# Patient Record
Sex: Female | Born: 1955
Health system: Southern US, Community
[De-identification: ages and names within clinical notes are randomized; demographics above are authoritative.]

## PROBLEM LIST (undated history)

## (undated) DIAGNOSIS — B338 Other specified viral diseases: Secondary | ICD-10-CM

## (undated) DIAGNOSIS — I209 Angina pectoris, unspecified: Secondary | ICD-10-CM

## (undated) DIAGNOSIS — K909 Intestinal malabsorption, unspecified: Secondary | ICD-10-CM

## (undated) DIAGNOSIS — R7303 Prediabetes: Secondary | ICD-10-CM

## (undated) DIAGNOSIS — R739 Hyperglycemia, unspecified: Secondary | ICD-10-CM

## (undated) DIAGNOSIS — Z8489 Family history of other specified conditions: Secondary | ICD-10-CM

## (undated) DIAGNOSIS — R011 Cardiac murmur, unspecified: Secondary | ICD-10-CM

## (undated) DIAGNOSIS — R112 Nausea with vomiting, unspecified: Secondary | ICD-10-CM

## (undated) DIAGNOSIS — K5792 Diverticulitis of intestine, part unspecified, without perforation or abscess without bleeding: Secondary | ICD-10-CM

## (undated) DIAGNOSIS — M1990 Unspecified osteoarthritis, unspecified site: Secondary | ICD-10-CM

## (undated) DIAGNOSIS — I499 Cardiac arrhythmia, unspecified: Secondary | ICD-10-CM

## (undated) DIAGNOSIS — I498 Other specified cardiac arrhythmias: Secondary | ICD-10-CM

## (undated) DIAGNOSIS — K4090 Unilateral inguinal hernia, without obstruction or gangrene, not specified as recurrent: Secondary | ICD-10-CM

## (undated) DIAGNOSIS — K829 Disease of gallbladder, unspecified: Secondary | ICD-10-CM

## (undated) DIAGNOSIS — I480 Paroxysmal atrial fibrillation: Secondary | ICD-10-CM

## (undated) DIAGNOSIS — Z87442 Personal history of urinary calculi: Secondary | ICD-10-CM

## (undated) DIAGNOSIS — I4892 Unspecified atrial flutter: Secondary | ICD-10-CM

## (undated) DIAGNOSIS — K635 Polyp of colon: Secondary | ICD-10-CM

## (undated) DIAGNOSIS — E7800 Pure hypercholesterolemia, unspecified: Secondary | ICD-10-CM

## (undated) DIAGNOSIS — I82409 Acute embolism and thrombosis of unspecified deep veins of unspecified lower extremity: Secondary | ICD-10-CM

## (undated) DIAGNOSIS — Z9889 Other specified postprocedural states: Secondary | ICD-10-CM

## (undated) DIAGNOSIS — D6851 Activated protein C resistance: Secondary | ICD-10-CM

## (undated) DIAGNOSIS — G5603 Carpal tunnel syndrome, bilateral upper limbs: Secondary | ICD-10-CM

## (undated) DIAGNOSIS — R42 Dizziness and giddiness: Secondary | ICD-10-CM

## (undated) DIAGNOSIS — K449 Diaphragmatic hernia without obstruction or gangrene: Secondary | ICD-10-CM

## (undated) DIAGNOSIS — B974 Respiratory syncytial virus as the cause of diseases classified elsewhere: Secondary | ICD-10-CM

## (undated) DIAGNOSIS — Z8719 Personal history of other diseases of the digestive system: Secondary | ICD-10-CM

## (undated) DIAGNOSIS — K219 Gastro-esophageal reflux disease without esophagitis: Secondary | ICD-10-CM

## (undated) DIAGNOSIS — M5432 Sciatica, left side: Secondary | ICD-10-CM

## (undated) DIAGNOSIS — K922 Gastrointestinal hemorrhage, unspecified: Secondary | ICD-10-CM

## (undated) DIAGNOSIS — D500 Iron deficiency anemia secondary to blood loss (chronic): Secondary | ICD-10-CM

## (undated) DIAGNOSIS — I10 Essential (primary) hypertension: Secondary | ICD-10-CM

## (undated) DIAGNOSIS — IMO0001 Reserved for inherently not codable concepts without codable children: Secondary | ICD-10-CM

## (undated) DIAGNOSIS — E669 Obesity, unspecified: Secondary | ICD-10-CM

## (undated) DIAGNOSIS — G4733 Obstructive sleep apnea (adult) (pediatric): Secondary | ICD-10-CM

## (undated) DIAGNOSIS — E119 Type 2 diabetes mellitus without complications: Secondary | ICD-10-CM

## (undated) DIAGNOSIS — T410X5A Adverse effect of inhaled anesthetics, initial encounter: Secondary | ICD-10-CM

## (undated) DIAGNOSIS — R7611 Nonspecific reaction to tuberculin skin test without active tuberculosis: Secondary | ICD-10-CM

## (undated) HISTORY — DX: Iron deficiency anemia secondary to blood loss (chronic): D50.0

## (undated) HISTORY — DX: Intestinal malabsorption, unspecified: K90.9

## (undated) HISTORY — DX: Gastro-esophageal reflux disease without esophagitis: K21.9

## (undated) HISTORY — PX: CARPAL TUNNEL RELEASE: SHX101

## (undated) HISTORY — DX: Diaphragmatic hernia without obstruction or gangrene: K44.9

## (undated) HISTORY — DX: Essential (primary) hypertension: I10

## (undated) HISTORY — DX: Obesity, unspecified: E66.9

## (undated) HISTORY — DX: Sciatica, left side: M54.32

## (undated) HISTORY — DX: Hyperglycemia, unspecified: R73.9

## (undated) HISTORY — PX: ESOPHAGOGASTRODUODENOSCOPY: SHX1529

## (undated) HISTORY — PX: COLON RESECTION: SHX5231

## (undated) HISTORY — PX: TMJ ARTHROPLASTY: SHX1066

## (undated) HISTORY — DX: Dizziness and giddiness: R42

## (undated) HISTORY — DX: Disease of gallbladder, unspecified: K82.9

## (undated) HISTORY — PX: OTHER SURGICAL HISTORY: SHX169

## (undated) HISTORY — DX: Gastrointestinal hemorrhage, unspecified: K92.2

## (undated) HISTORY — DX: Paroxysmal atrial fibrillation: I48.0

## (undated) HISTORY — DX: Unilateral inguinal hernia, without obstruction or gangrene, not specified as recurrent: K40.90

## (undated) HISTORY — DX: Carpal tunnel syndrome, bilateral upper limbs: G56.03

## (undated) HISTORY — PX: COLONOSCOPY: SHX174

## (undated) HISTORY — DX: Pure hypercholesterolemia, unspecified: E78.00

## (undated) HISTORY — PX: COLONOSCOPY W/ BIOPSIES AND POLYPECTOMY: SHX1376

## (undated) HISTORY — PX: INGUINAL HERNIA REPAIR: SUR1180

## (undated) HISTORY — DX: Reserved for inherently not codable concepts without codable children: IMO0001

## (undated) HISTORY — PX: BREAST CYST ASPIRATION: SHX578

## (undated) HISTORY — DX: Polyp of colon: K63.5

---

## 1975-03-24 DIAGNOSIS — R7611 Nonspecific reaction to tuberculin skin test without active tuberculosis: Secondary | ICD-10-CM

## 1975-03-24 HISTORY — DX: Nonspecific reaction to tuberculin skin test without active tuberculosis: R76.11

## 1986-03-23 HISTORY — PX: TUBAL LIGATION: SHX77

## 1987-03-24 HISTORY — PX: LAPAROSCOPIC CHOLECYSTECTOMY: SUR755

## 2004-02-22 ENCOUNTER — Encounter: Payer: Self-pay | Admitting: Internal Medicine

## 2006-03-23 HISTORY — PX: CARDIAC CATHETERIZATION: SHX172

## 2006-07-26 ENCOUNTER — Encounter: Payer: Self-pay | Admitting: Internal Medicine

## 2006-11-10 ENCOUNTER — Encounter: Payer: Self-pay | Admitting: Internal Medicine

## 2007-08-29 ENCOUNTER — Encounter: Payer: Self-pay | Admitting: Internal Medicine

## 2008-01-11 ENCOUNTER — Encounter: Payer: Self-pay | Admitting: Internal Medicine

## 2008-01-26 ENCOUNTER — Encounter: Payer: Self-pay | Admitting: Internal Medicine

## 2008-02-03 ENCOUNTER — Encounter: Payer: Self-pay | Admitting: Internal Medicine

## 2008-03-23 HISTORY — PX: ATRIAL FIBRILLATION ABLATION: SHX5732

## 2008-04-11 ENCOUNTER — Encounter: Payer: Self-pay | Admitting: Internal Medicine

## 2008-04-12 ENCOUNTER — Encounter: Payer: Self-pay | Admitting: Internal Medicine

## 2008-07-30 ENCOUNTER — Encounter: Payer: Self-pay | Admitting: Internal Medicine

## 2008-08-21 ENCOUNTER — Encounter: Payer: Self-pay | Admitting: Internal Medicine

## 2008-11-19 ENCOUNTER — Encounter: Payer: Self-pay | Admitting: Internal Medicine

## 2009-05-30 ENCOUNTER — Encounter: Payer: Self-pay | Admitting: Internal Medicine

## 2009-06-07 ENCOUNTER — Encounter: Payer: Self-pay | Admitting: Internal Medicine

## 2009-09-09 DIAGNOSIS — I480 Paroxysmal atrial fibrillation: Secondary | ICD-10-CM | POA: Insufficient documentation

## 2009-09-09 HISTORY — DX: Paroxysmal atrial fibrillation: I48.0

## 2009-09-10 DIAGNOSIS — G5600 Carpal tunnel syndrome, unspecified upper limb: Secondary | ICD-10-CM | POA: Insufficient documentation

## 2009-09-10 DIAGNOSIS — E7800 Pure hypercholesterolemia, unspecified: Secondary | ICD-10-CM | POA: Insufficient documentation

## 2009-09-10 DIAGNOSIS — K219 Gastro-esophageal reflux disease without esophagitis: Secondary | ICD-10-CM | POA: Insufficient documentation

## 2009-09-10 DIAGNOSIS — K829 Disease of gallbladder, unspecified: Secondary | ICD-10-CM | POA: Insufficient documentation

## 2009-09-10 DIAGNOSIS — J45909 Unspecified asthma, uncomplicated: Secondary | ICD-10-CM | POA: Insufficient documentation

## 2009-09-10 DIAGNOSIS — M26609 Unspecified temporomandibular joint disorder, unspecified side: Secondary | ICD-10-CM | POA: Insufficient documentation

## 2009-09-11 ENCOUNTER — Telehealth (INDEPENDENT_AMBULATORY_CARE_PROVIDER_SITE_OTHER): Payer: Self-pay | Admitting: *Deleted

## 2009-09-11 ENCOUNTER — Ambulatory Visit: Payer: Self-pay | Admitting: Internal Medicine

## 2009-09-11 ENCOUNTER — Ambulatory Visit: Payer: Self-pay | Admitting: Cardiovascular Disease

## 2009-10-09 ENCOUNTER — Ambulatory Visit: Payer: Self-pay | Admitting: Cardiology

## 2009-10-09 LAB — CONVERTED CEMR LAB: POC INR: 2.1

## 2009-11-09 ENCOUNTER — Ambulatory Visit (HOSPITAL_COMMUNITY): Admission: RE | Admit: 2009-11-09 | Discharge: 2009-11-09 | Payer: Self-pay | Admitting: Sports Medicine

## 2009-11-13 ENCOUNTER — Ambulatory Visit: Payer: Self-pay | Admitting: Internal Medicine

## 2009-12-06 ENCOUNTER — Ambulatory Visit: Payer: Self-pay | Admitting: Internal Medicine

## 2009-12-11 ENCOUNTER — Ambulatory Visit: Payer: Self-pay | Admitting: Cardiology

## 2009-12-11 LAB — CONVERTED CEMR LAB: POC INR: 2.3

## 2010-01-07 ENCOUNTER — Ambulatory Visit: Payer: Self-pay | Admitting: Cardiology

## 2010-01-20 ENCOUNTER — Ambulatory Visit: Payer: Self-pay | Admitting: Internal Medicine

## 2010-01-20 LAB — CONVERTED CEMR LAB: POC INR: 2.4

## 2010-02-17 ENCOUNTER — Ambulatory Visit: Payer: Self-pay | Admitting: Internal Medicine

## 2010-02-17 LAB — CONVERTED CEMR LAB: POC INR: 2.8

## 2010-02-28 ENCOUNTER — Encounter: Payer: Self-pay | Admitting: Internal Medicine

## 2010-02-28 ENCOUNTER — Ambulatory Visit: Payer: Self-pay | Admitting: Internal Medicine

## 2010-03-19 ENCOUNTER — Ambulatory Visit: Payer: Self-pay | Admitting: Cardiology

## 2010-03-19 LAB — CONVERTED CEMR LAB: POC INR: 2

## 2010-04-10 ENCOUNTER — Ambulatory Visit: Payer: Self-pay | Admitting: Internal Medicine

## 2010-04-16 ENCOUNTER — Encounter: Payer: Self-pay | Admitting: Internal Medicine

## 2010-04-16 ENCOUNTER — Ambulatory Visit: Admission: RE | Admit: 2010-04-16 | Discharge: 2010-04-16 | Payer: Self-pay | Source: Home / Self Care

## 2010-04-16 LAB — CONVERTED CEMR LAB
INR: 2.2
POC INR: 2.2

## 2010-04-16 LAB — CBC WITH DIFFERENTIAL/PLATELET
BASO%: 0.7 % (ref 0.0–2.0)
Basophils Absolute: 0 10*3/uL (ref 0.0–0.1)
Eosinophils Absolute: 0.1 10*3/uL (ref 0.0–0.5)
HCT: 38.2 % (ref 34.8–46.6)
MCHC: 34.2 g/dL (ref 31.5–36.0)
MONO#: 0.4 10*3/uL (ref 0.1–0.9)
MONO%: 5.8 % (ref 0.0–14.0)
NEUT%: 56.7 % (ref 38.4–76.8)
Platelets: 338 10*3/uL (ref 145–400)
RDW: 13.4 % (ref 11.2–14.5)
WBC: 6.8 10*3/uL (ref 3.9–10.3)

## 2010-04-21 LAB — HYPERCOAGULABLE PANEL, COMPREHENSIVE
AntiThromb III Func: 131 % — ABNORMAL HIGH (ref 76–126)
Anticardiolipin IgA: 2 APL U/mL (ref <22)
Anticardiolipin IgG: 2 GPL U/mL (ref <23)
Beta-2-Glycoprotein I IgA: 2 A Units (ref <20)
DRVVT 1:1 Mix: 40.9 secs (ref 36.2–44.3)
Lupus Anticoagulant: NOT DETECTED
Protein C, Total: 98 % (ref 70–140)
Protein S Activity: 52 % — ABNORMAL LOW (ref 69–129)
Protein S Ag, Total: 74 % (ref 70–140)

## 2010-04-22 NOTE — Procedures (Signed)
Summary: [facility] Holter Report   Holter Report   Imported By: Roderic Ovens 10/31/2009 12:14:05  _____________________________________________________________________  External Attachment:    Type:   Image     Comment:   External Document

## 2010-04-22 NOTE — Progress Notes (Signed)
  pt came in today signed 5 ROI, faxed to [facility],Dr.ernest Amiga,[facility]. Erle Crocker  September 11, 2009 12:41 PM     Appended Document:  Recieved Records from [facility]..gave to Marlowe Kays   Appended Document:  Received records from [facility] (2 separate packs) via the fax machine-forwarded to Dr. Johney Frame / Tresa Endo for review.  Appended Document:  Recieved another set tof Records from [facility] gave to [place]   Appended Document:  Recieved Records back form [facility] Dr.Ernest Gomez Clever[place]. put on Gesila's desk   Appended Document:  Records Recieved back from [facility] sent to [facility]

## 2010-04-22 NOTE — Procedures (Signed)
Summary: [facility] Holter Report  [facility] Holter Report   Imported By: Roderic Ovens 10/31/2009 12:16:46  _____________________________________________________________________  External Attachment:    Type:   Image     Comment:   External Document

## 2010-04-22 NOTE — Procedures (Signed)
Summary: [facility] Holter Report  [facility] Holter Report   Imported By: Roderic Ovens 10/31/2009 12:17:41  _____________________________________________________________________  External Attachment:    Type:   Image     Comment:   External Document

## 2010-04-22 NOTE — Letter (Signed)
Summary: [facility] - EP - Ablation  [facility] - EP - Ablation   Imported By: Marylou Mccoy 10/09/2009 18:49:06  _____________________________________________________________________  External Attachment:    Type:   Image     Comment:   External Document

## 2010-04-22 NOTE — Medication Information (Signed)
Summary: NEW/COUMADIN/SAF  Anticoagulant Therapy  Managed by: Weston Brass, PharmD Referring MD: Johney Frame PCP: Dr Juluis Pitch MD: Eden Emms MD, Theron Arista Indication 1: Atrial Fibrillation Lab Used: [facility] [place] Site: [address] INR POC 2.3 INR RANGE 2.0-3.0  Dietary changes: no    Health status changes: no    Bleeding/hemorrhagic complications: no    Recent/future hospitalizations: no    Any changes in medication regimen? no    Recent/future dental: no  Any missed doses?: no       Is patient compliant with meds? yes       Allergies: 1)  ! * Demerol 2)  ! Pcn 3)  ! Sulfa 4)  ! * Latex 5)  ! * Contrast Dye  Anticoagulation Management History:      The patient comes in today for her initial visit for anticoagulation therapy.  Negative risk factors for bleeding include an age less than 97 years old.  The bleeding index is 'low risk'.  Negative CHADS2 values include Age > 80 years old.  Anticoagulation responsible provider: Eden Emms MD, Theron Arista.  INR POC: 2.3.  Cuvette Lot#: [id number].  Exp: 11/2010.    Anticoagulation Management Assessment/Plan:      The patient's current anticoagulation dose is Coumadin 5 mg tabs: as directed.  The target INR is 2.0-3.0.  The next INR is due 10/09/2009.  Anticoagulation instructions were given to patient.  Results were reviewed/authorized by Weston Brass, PharmD.  She was notified by Weston Brass PharmD.         Current Anticoagulation Instructions: INR 2.3  Continue same dose of 4mg  every day except 6mg  on Tuesday and Thursday

## 2010-04-22 NOTE — Letter (Signed)
Summary: [facility] Cath Lab Report   [facility] Cath Lab Report   Imported By: Roderic Ovens 09/26/2009 11:58:36  _____________________________________________________________________  External Attachment:    Type:   Image     Comment:   External Document

## 2010-04-22 NOTE — Medication Information (Signed)
Summary: [name]  Anticoagulant Therapy  Managed by: Weston Brass, PharmD Referring MD: Johney Frame PCP: Dr Juluis Pitch MD: Myrtis Ser MD, Tinnie Gens Indication 1: Atrial Fibrillation Lab Used: [facility]  Site: [address] INR POC 1.5 INR RANGE 2.0-3.0  Dietary changes: no    Health status changes: no    Bleeding/hemorrhagic complications: no    Recent/future hospitalizations: no    Any changes in medication regimen? no    Recent/future dental: no  Any missed doses?: no       Is patient compliant with meds? yes       Allergies: 1)  ! * Demerol 2)  ! Pcn 3)  ! Sulfa 4)  ! * Latex 5)  ! * Contrast Dye  Anticoagulation Management History:      The patient is taking warfarin and comes in today for a routine follow up visit.  Negative risk factors for bleeding include an age less than 74 years old.  The bleeding index is 'low risk'.  Negative CHADS2 values include Age > 54 years old.  Anticoagulation responsible Kanita Delage: Myrtis Ser MD, Tinnie Gens.  INR POC: 1.5.  Cuvette Lot#: [id number].  Exp: 01/2011.    Anticoagulation Management Assessment/Plan:      The patient's current anticoagulation dose is Warfarin sodium 2 mg tabs: Use as directed by Anticoagualtion Clinic.  The target INR is 2.0-3.0.  The next INR is due 01/20/2010.  Anticoagulation instructions were given to patient.  Results were reviewed/authorized by Weston Brass, PharmD.  She was notified by Weston Brass PharmD.         Prior Anticoagulation Instructions: INR 2.3  Continue taking  2 tablets everyday except take 3 tablets on Tuesdays and Thursdays. Re-check INR in 4 weeks.   Current Anticoagulation Instructions: INR 1.5  Take an extra 1 tablet today, 3 tablets tomorrow, then resume same dose of 2 tablets every day except 3 tablets on Tuesday and Thursday.

## 2010-04-22 NOTE — Letter (Signed)
Summary: [facility] Office Visit   [facility] Office Visit   Imported By: Roderic Ovens 10/31/2009 12:12:49  _____________________________________________________________________  External Attachment:    Type:   Image     Comment:   External Document

## 2010-04-22 NOTE — Medication Information (Signed)
Summary: rov/sp  Anticoagulant Therapy  Managed by: Weston Brass, PharmD Referring MD: Johney Frame PCP: Dr Juluis Pitch MD: Myrtis Ser MD, Tinnie Gens Indication 1: Atrial Fibrillation Lab Used: [facility] [place] Site: [address] INR POC 2.1 INR RANGE 2.0-3.0  Dietary changes: no    Health status changes: no    Bleeding/hemorrhagic complications: no    Recent/future hospitalizations: no    Any changes in medication regimen? no    Recent/future dental: no  Any missed doses?: no       Is patient compliant with meds? yes       Allergies: 1)  ! * Demerol 2)  ! Pcn 3)  ! Sulfa 4)  ! * Latex 5)  ! * Contrast Dye  Anticoagulation Management History:      The patient is taking warfarin and comes in today for a routine follow up visit.  Negative risk factors for bleeding include an age less than 20 years old.  The bleeding index is 'low risk'.  Negative CHADS2 values include Age > 27 years old.  Anticoagulation responsible provider: Myrtis Ser MD, Tinnie Gens.  INR POC: 2.1.  Cuvette Lot#: [id number].  Exp: 12/2010.    Anticoagulation Management Assessment/Plan:      The patient's current anticoagulation dose is Coumadin 5 mg tabs: as directed.  The target INR is 2.0-3.0.  The next INR is due 11/06/2009.  Anticoagulation instructions were given to patient.  Results were reviewed/authorized by Weston Brass, PharmD.  She was notified by Weston Brass PharmD.         Prior Anticoagulation Instructions: INR 2.3  Continue same dose of 4mg  every day except 6mg  on Tuesday and Thursday  Current Anticoagulation Instructions: INR 2.1  Continue same dose of 2 tablets every day except 3 tablets on Tuesday and Thursday.

## 2010-04-22 NOTE — Medication Information (Signed)
Summary: coumadin ck rs missed appt/mt  Anticoagulant Therapy  Managed by: Weston Brass, PharmD Referring MD: Johney Frame PCP: Dr Azucena Cecil Supervising MD: Johney Frame MD, Fayrene Fearing Indication 1: Atrial Fibrillation Lab Used: [facility] [place] Site: [address] INR POC 2.2 INR RANGE 2.0-3.0  Dietary changes: no    Health status changes: no    Bleeding/hemorrhagic complications: no    Recent/future hospitalizations: no    Any changes in medication regimen? no    Recent/future dental: no  Any missed doses?: no       Is patient compliant with meds? yes       Allergies: 1)  ! * Demerol 2)  ! Pcn 3)  ! Sulfa 4)  ! * Latex 5)  ! * Contrast Dye  Anticoagulation Management History:      The patient is taking warfarin and comes in today for a routine follow up visit.  Negative risk factors for bleeding include an age less than 28 years old.  The bleeding index is 'low risk'.  Negative CHADS2 values include Age > 32 years old.  Anticoagulation responsible Aryanne Gilleland: Allred MD, Fayrene Fearing.  INR POC: 2.2.  Exp: 12/2010.    Anticoagulation Management Assessment/Plan:      The patient's current anticoagulation dose is Warfarin sodium 2 mg tabs: Use as directed by Anticoagualtion Clinic.  The target INR is 2.0-3.0.  The next INR is due 12/11/2009.  Anticoagulation instructions were given to patient.  Results were reviewed/authorized by Weston Brass, PharmD.  She was notified by Weston Brass PharmD.         Prior Anticoagulation Instructions: INR 2.1  Continue same dose of 2 tablets every day except 3 tablets on Tuesday and Thursday.   Current Anticoagulation Instructions: INR 2.2  Continue same dose of 2 tablets every day except 3 tablets on Tuesday and Thursday.  Recheck INR in 4 weeks.

## 2010-04-22 NOTE — Letter (Signed)
Summary: [facility] Office Note   [facility] Office Note   Imported By: Roderic Ovens 10/31/2009 12:08:21  _____________________________________________________________________  External Attachment:    Type:   Image     Comment:   External Document

## 2010-04-22 NOTE — Assessment & Plan Note (Signed)
Summary: NEP/ HX OF AFIB/JML   Visit Type:  Initial Consult Primary Provider:  Dr Azucena Cecil  CC:  afib.  History of Present Illness: [name] is a pleasant 55 yo WF with a h/o atrial fibrillation s/p PVI at [facility] by Dr Sharlene Dory who presents today to establish cardiology care after moving to [facility].  She reports initially being diagnosed with atrial fibrillation 5 years ago.  She reports symptoms of palpitations and dizziness initially.  Episodes increased in both frequency and duration despite medical therapy.  She failed medical therapy with flecainide, verapamil, and sotalol.  She reports having both atrial flutter and wandering atrial pacemaker in addition to afib.  She then had an afib ablation by Dr Sharlene Dory 1/10.  She reports doing well since then.  She reports forceful heart "pounding" 1-2 times per week, lasting 1-2 hours.  This typically resolves with lying on her left side.  She is unaware of any further episodes of afib. She reports being helpful otherwise.  She remains active.  She reports mild chest pressure and SOB with heavy activy or walking up an incline.   She reports having a stress test to evaluate these symptoms in 9/09 which was "normal".  She does not feel that these symptoms have increased or changes in character since that time.  Current Medications (verified): 1)  Beconase Aq 42 Mcg/spray Susp (Beclomethasone Diprop Monohyd) .... 2 Sprays Two Times A Day 2)  Asmanex 120 Metered Doses 220 Mcg/inh Aepb (Mometasone Furoate) .... As Directed 3)  Cetirizine Hcl 10 Mg Tabs (Cetirizine Hcl) .Marland Kitchen.. 1 By Mouth Daily 4)  Coumadin 5 Mg Tabs (Warfarin Sodium) .... As Directed 5)  Omeprazole 40 Mg Cpdr (Omeprazole) .Marland Kitchen.. 1 By Mouth Daily 6)  Sertraline Hcl 50 Mg Tabs (Sertraline Hcl) .Marland Kitchen.. 1 By Mouth Daily 7)  Flecainide Acetate 150 Mg Tabs (Flecainide Acetate) .Marland Kitchen.. 1 By Mouth Daily 8)  Verapamil Hcl 80 Mg Tabs (Verapamil Hcl) .... 2 By Mouth Two Times A Day and As Needed 9)  Femhrt  Low Dose 0.5-2.5 Mg-Mcg Tabs (Norethindrone-Eth Estradiol) .... As Directed 10)  Citracal Plus  Tabs (Multiple Minerals-Vitamins) .Marland Kitchen.. 1 By Mouth Daily 11)  Multivitamins   Tabs (Multiple Vitamin) .Marland Kitchen.. 1 By Mouth Daily 12)  Vitamin D3 1000 Unit Tabs (Cholecalciferol) .Marland Kitchen.. 1 By Mouth Daily 13)  Losartan Potassium 50 Mg Tabs (Losartan Potassium) .Marland Kitchen.. 1 Podaily  Allergies (verified): 1)  ! * Demerol 2)  ! Pcn 3)  ! Sulfa 4)  ! * Latex 5)  ! * Contrast Dye  Past History:  Past Medical History: Allergic Rhinitis CARPAL TUNNEL SYNDROME (ICD-354.0) TMJ SYNDROME (ICD-524.60) HYPERCHOLESTEROLEMIA (ICD-272.0) GERD (ICD-530.81) ASTHMA (ICD-493.90) PAROXYSMAL ATRIAL FIBRILLATION (ICD-427.31) Chronic Pericardial Effusion Rheumatic arthritis in 3rd grade treated with PCN/ prednisone.  She does not think she had rheumatic fever with this. Cath 2006- normal cors per pt with pulmonary HTN OSA not complaint with CPAP Obesity  Past Surgical History: c-section carpal tunnel repair inguinal hernia repair gallbladder removal tmj repair child birth x 2 Atrial fibrillation ablation 1/10 by Dr Sharlene Dory at [facility]  Family History: sister has lupus and a recent CVA Grandfather had MI at age 39.  Social History: Pt moved to [place] from [place] 7/10.  She works full Time as the Arts administrator for [employer].  Married  Alcohol Use - rare glass of wine.  Tob- denies.   Review of Systems       All systems are reviewed and negative except  as listed in the HPI.  Also DJD in R knee.  Vital Signs:  Patient profile:   55 year old female Height:      64 inches Weight:      220 pounds BMI:     37.90 Pulse rate:   67 / minute Resp:     16 per minute BP sitting:   127 / 70  (right arm)  Vitals Entered By: Marrion Coy, CNA (September 11, 2009 9:02 AM)  Physical Exam  General:  obese, NAD Head:  normocephalic and atraumatic Eyes:  PERRLA/EOM intact; conjunctiva and  lids normal. Mouth:  Teeth, gums and palate normal. Oral mucosa normal. Neck:  Neck supple, no JVD. No masses, thyromegaly or abnormal cervical nodes. Lungs:  Clear bilaterally to auscultation and percussion. Heart:  Non-displaced PMI, chest non-tender; regular rate and rhythm, S1, S2 without murmurs, rubs or gallops. Carotid upstroke normal, no bruit. Normal abdominal aortic size, no bruits. Femorals normal pulses, no bruits. Pedals normal pulses. No edema, no varicosities. Abdomen:  Bowel sounds positive; abdomen soft and non-tender without masses, organomegaly, or hernias noted. No hepatosplenomegaly. Msk:  Back normal, normal gait. Muscle strength and tone normal. Pulses:  pulses normal in all 4 extremities Extremities:  No clubbing or cyanosis. Neurologic:  Alert and oriented x 3. Skin:  Intact without lesions or rashes. Cervical Nodes:  no significant adenopathy Psych:  Normal affect.   EKG  Procedure date:  09/11/2009  Findings:      sinus rhythm 68 bpm, P R224, QT  Impression & Recommendations:  Problem # 1:  ATRIAL FIBRILLATION (ICD-427.31)  Doing well s/p ablation.  We will decrease flecainide to 100mg  two times a day.  I would like to minimize flecainide dose and possibly stop this medicine longterm.  Her CHADS2 score is 0.  She cannot take ASA due to dyspesia/ ulcers.  Upon discussion with Dr Sharlene Dory, she states that she prefers coumadin longterm.  She declines pradaxa as an alternative.  We will therefore enroll her in our coumdin clinic. We will continue verapamil at this time.  We will obtain records from prior cath, stress test, echo, and afib ablation  Orders: [address] Coumadin Clinic Referral (Coumadin clinic)  Problem # 2:  HYPERCHOLESTEROLEMIA (ICD-272.0) stable  Patient Instructions: 1)  Your physician recommends that you schedule a follow-up appointment in: 6 months with Dr Johney Frame 2)  Your physician has recommended you make the following change in  your medication: decrease Flecainide to 100mg  two times a day Prescriptions: FLECAINIDE ACETATE 100 MG TABS (FLECAINIDE ACETATE) one by mouth two times a day  #60 x 3   Entered by:   Dennis Bast, RN, BSN   Authorized by:   Hillis Range, MD   Signed by:   Dennis Bast, RN, BSN on 09/11/2009   Method used:   Electronically to        [facility]  [address] [phone]* (retail)       [address]       [place], [place]  [address]       Ph: [phone] or [phone]       Fax: [phone]   RxID:   [id number]

## 2010-04-22 NOTE — Letter (Signed)
Summary: Electrophysiology Procedure Report   Electrophysiology Procedure Report   Imported By: Roderic Ovens 10/31/2009 12:12:23  _____________________________________________________________________  External Attachment:    Type:   Image     Comment:   External Document

## 2010-04-22 NOTE — Medication Information (Signed)
Summary: rov/tm  Anticoagulant Therapy  Managed by: Weston Brass, PharmD Referring MD: Johney Frame PCP: Dr Juluis Pitch MD: Shirlee Latch MD, Freida Busman Indication 1: Atrial Fibrillation Lab Used: [facility] [place] Site: [address] INR POC 2.3 INR RANGE 2.0-3.0  Dietary changes: no    Health status changes: no    Bleeding/hemorrhagic complications: no    Recent/future hospitalizations: no    Any changes in medication regimen? no    Recent/future dental: no  Any missed doses?: no       Is patient compliant with meds? yes       Allergies: 1)  ! * Demerol 2)  ! Pcn 3)  ! Sulfa 4)  ! * Latex 5)  ! * Contrast Dye  Anticoagulation Management History:      The patient is taking warfarin and comes in today for a routine follow up visit.  Negative risk factors for bleeding include an age less than 49 years old.  The bleeding index is 'low risk'.  Negative CHADS2 values include Age > 29 years old.  Anticoagulation responsible provider: Shirlee Latch MD, Bernestine Holsapple.  INR POC: 2.3.  Cuvette Lot#: [id number].  Exp: 12/2010.    Anticoagulation Management Assessment/Plan:      The patient's current anticoagulation dose is Warfarin sodium 2 mg tabs: Use as directed by Anticoagualtion Clinic.  The target INR is 2.0-3.0.  The next INR is due 01/07/2010.  Anticoagulation instructions were given to patient.  Results were reviewed/authorized by Weston Brass, PharmD.  She was notified by Harrel Carina, PharmD candidate.         Prior Anticoagulation Instructions: INR 2.2  Continue same dose of 2 tablets every day except 3 tablets on Tuesday and Thursday.  Recheck INR in 4 weeks.   Current Anticoagulation Instructions: INR 2.3  Continue taking  2 tablets everyday except take 3 tablets on Tuesdays and Thursdays. Re-check INR in 4 weeks.

## 2010-04-22 NOTE — Assessment & Plan Note (Signed)
Summary: PER CHECK OUT/SF   Primary Provider:  Dr Azucena Cecil   History of Present Illness: The patient presents today for routine electrophysiology followup. She reports doing very well since last being seen in our clinic.  She has occasional palpitations but no sustained afib.  She felt that her palpitations were worse with decreasing flecainide to 75mg  BID but has tolerated 100mg  BID quite well.  The patient denies symptoms of chest pain, shortness of breath, orthopnea, PND, lower extremity edema, dizziness, presyncope, syncope, or neurologic sequela. The patient is tolerating medications without difficulties and is otherwise without complaint today.   Current Medications (verified): 1)  Beconase Aq 42 Mcg/spray Susp (Beclomethasone Diprop Monohyd) .... 2 Sprays Two Times A Day 2)  Asmanex 120 Metered Doses 220 Mcg/inh Aepb (Mometasone Furoate) .... As Directed 3)  Cetirizine Hcl 10 Mg Tabs (Cetirizine Hcl) .Marland Kitchen.. 1 By Mouth Daily 4)  Omeprazole 40 Mg Cpdr (Omeprazole) .Marland Kitchen.. 1 By Mouth Daily 5)  Sertraline Hcl 50 Mg Tabs (Sertraline Hcl) .Marland Kitchen.. 1 By Mouth Daily 6)  Flecainide Acetate 100 Mg Tabs (Flecainide Acetate) .... One By Mouth Two Times A Day 7)  Verapamil Hcl 80 Mg Tabs (Verapamil Hcl) .... 2 By Mouth Two Times A Day and As Needed 8)  Citracal Plus  Tabs (Multiple Minerals-Vitamins) .Marland Kitchen.. 1 By Mouth Daily 9)  Multivitamins   Tabs (Multiple Vitamin) .Marland Kitchen.. 1 By Mouth Daily 10)  Vitamin D3 1000 Unit Tabs (Cholecalciferol) .Marland Kitchen.. 1 By Mouth Daily 11)  Losartan Potassium 50 Mg Tabs (Losartan Potassium) .Marland Kitchen.. 1 Podaily 12)  Warfarin Sodium 2 Mg Tabs (Warfarin Sodium) .... Use As Directed By Anticoagualtion Clinic  Allergies (verified): 1)  ! * Demerol 2)  ! Pcn 3)  ! Sulfa 4)  ! * Latex 5)  ! * Contrast Dye  Past History:  Past Medical History: Reviewed history from 09/11/2009 and no changes required. Allergic Rhinitis CARPAL TUNNEL SYNDROME (ICD-354.0) TMJ SYNDROME  (ICD-524.60) HYPERCHOLESTEROLEMIA (ICD-272.0) GERD (ICD-530.81) ASTHMA (ICD-493.90) PAROXYSMAL ATRIAL FIBRILLATION (ICD-427.31) Chronic Pericardial Effusion Rheumatic arthritis in 3rd grade treated with PCN/ prednisone.  She does not think she had rheumatic fever with this. Cath 2006- normal cors per pt with pulmonary HTN OSA not complaint with CPAP Obesity  Vital Signs:  Patient profile:   55 year old female Height:      64 inches Weight:      224 pounds BMI:     38.59 Pulse rate:   60 / minute BP sitting:   112 / 80  (right arm)  Vitals Entered By: Laurance Flatten CMA (February 28, 2010 2:37 PM)  Physical Exam  General:  obese, NAD Head:  normocephalic and atraumatic Eyes:  PERRLA/EOM intact; conjunctiva and lids normal. Mouth:  Teeth, gums and palate normal. Oral mucosa normal. Neck:  Neck supple, no JVD. No masses, thyromegaly or abnormal cervical nodes. Lungs:  Clear bilaterally to auscultation and percussion. Heart:  RRR, 2/6 early systolic murmur LUSB Abdomen:  Bowel sounds positive; abdomen soft and non-tender without masses, organomegaly, or hernias noted. No hepatosplenomegaly. Msk:  Back normal, normal gait. Muscle strength and tone normal. Extremities:  No clubbing or cyanosis. Neurologic:  Alert and oriented x 3.   EKG  Procedure date:  02/28/2010  Findings:      sinus rhythm 60 bpm, otherwise normal ekg  Impression & Recommendations:  Problem # 1:  ATRIAL FIBRILLATION (ICD-427.31) doing very well continue coumadin longterm (she declines pradaxa or xarelto) no changes today consider functional coronary assessment upon  return  Problem # 2:     Patient Instructions: 1)  return in 12 months

## 2010-04-22 NOTE — Letter (Signed)
Summary: [facility] Clinic Note   [facility] Clinic Note   Imported By: Roderic Ovens 10/31/2009 12:10:21  _____________________________________________________________________  External Attachment:    Type:   Image     Comment:   External Document

## 2010-04-22 NOTE — Medication Information (Signed)
Summary: rov/sp  Anticoagulant Therapy  Managed by: Reina Fuse, PharmD Referring MD: Johney Frame PCP: Dr Juluis Pitch MD: Tenny Craw MD, Gunnar Fusi Indication 1: Atrial Fibrillation Lab Used: [facility] [place] Site: [address] INR POC 2.4 INR RANGE 2.0-3.0  Dietary changes: no    Health status changes: no    Bleeding/hemorrhagic complications: no    Recent/future hospitalizations: no    Any changes in medication regimen? no    Recent/future dental: no  Any missed doses?: no       Is patient compliant with meds? yes       Allergies: 1)  ! * Demerol 2)  ! Pcn 3)  ! Sulfa 4)  ! * Latex 5)  ! * Contrast Dye  Anticoagulation Management History:      The patient is taking warfarin and comes in today for a routine follow up visit.  Negative risk factors for bleeding include an age less than 23 years old.  The bleeding index is 'low risk'.  Negative CHADS2 values include Age > 47 years old.  Anticoagulation responsible provider: Tenny Craw MD, Gunnar Fusi.  INR POC: 2.4.  Cuvette Lot#: [id number].  Exp: 01/2011.    Anticoagulation Management Assessment/Plan:      The patient's current anticoagulation dose is Warfarin sodium 2 mg tabs: Use as directed by Anticoagualtion Clinic.  The target INR is 2.0-3.0.  The next INR is due 02/17/2010.  Anticoagulation instructions were given to patient.  Results were reviewed/authorized by Reina Fuse, PharmD.  She was notified by Reina Fuse PharmD.         Prior Anticoagulation Instructions: INR 1.5  Take an extra 1 tablet today, 3 tablets tomorrow, then resume same dose of 2 tablets every day except 3 tablets on Tuesday and Thursday.    Current Anticoagulation Instructions: INR 2.4  Continue taking Coumadin 2 tabs (4 mg) on all days except Coumadin 3 tabs (6 mg) on Tuesdays and Thursdays. Return to clinic in 4 weeks.

## 2010-04-22 NOTE — Letter (Signed)
Summary: [facility] - Transesophageal Echo  [facility] - Transesophageal Echo   Imported By: Marylou Mccoy 10/09/2009 18:52:30  _____________________________________________________________________  External Attachment:    Type:   Image     Comment:   External Document

## 2010-04-22 NOTE — Procedures (Signed)
Summary: [facility] Holter Report  [facility] Holter Report   Imported By: Roderic Ovens 10/31/2009 12:17:21  _____________________________________________________________________  External Attachment:    Type:   Image     Comment:   External Document

## 2010-04-22 NOTE — Letter (Signed)
Summary: [facility] Progress [facility]  [facility] Progress Note   Imported By: Roderic Ovens 09/16/2009 11:58:50  _____________________________________________________________________  External Attachment:    Type:   Image     Comment:   External Document

## 2010-04-22 NOTE — Medication Information (Signed)
Summary: rov/sl  Anticoagulant Therapy  Managed by: Lyna Poser, PharmD Referring MD: Johney Frame PCP: Dr Juluis Pitch MD: Tenny Craw MD, Gunnar Fusi Indication 1: Atrial Fibrillation Lab Used: [facility] [place] Site: [address] INR POC 2.8 INR RANGE 2.0-3.0  Dietary changes: no    Health status changes: no    Bleeding/hemorrhagic complications: no    Recent/future hospitalizations: no    Any changes in medication regimen? no    Recent/future dental: no  Any missed doses?: no       Is patient compliant with meds? yes       Allergies: 1)  ! * Demerol 2)  ! Pcn 3)  ! Sulfa 4)  ! * Latex 5)  ! * Contrast Dye  Anticoagulation Management History:      The patient is taking warfarin and comes in today for a routine follow up visit.  Negative risk factors for bleeding include an age less than 1 years old.  The bleeding index is 'low risk'.  Negative CHADS2 values include Age > 28 years old.  Anticoagulation responsible Dakayla Disanti: Tenny Craw MD, Gunnar Fusi.  INR POC: 2.8.  Cuvette Lot#: [id number].  Exp: 01/2011.    Anticoagulation Management Assessment/Plan:      The patient's current anticoagulation dose is Warfarin sodium 2 mg tabs: Use as directed by Anticoagualtion Clinic.  The target INR is 2.0-3.0.  The next INR is due 03/19/2010.  Anticoagulation instructions were given to patient.  Results were reviewed/authorized by Lyna Poser, PharmD.         Prior Anticoagulation Instructions: INR 2.4  Continue taking Coumadin 2 tabs (4 mg) on all days except Coumadin 3 tabs (6 mg) on Tuesdays and Thursdays. Return to clinic in 4 weeks.   Current Anticoagulation Instructions: INR 2.8 Continue taking 3 tablets on tuesday and thursday. And 2 tablets all other days. Recheck INR in 4 weeks.

## 2010-04-22 NOTE — Letter (Signed)
Summary: [facility]   Imported By: Roderic Ovens 10/31/2009 12:10:50  _____________________________________________________________________  External Attachment:    Type:   Image     Comment:   External Document

## 2010-04-22 NOTE — Letter (Signed)
Summary: [facility] - Coronary CT Angiography  [facility] - Coronary CT Angiography   Imported By: Marylou Mccoy 10/09/2009 18:51:39  _____________________________________________________________________  External Attachment:    Type:   Image     Comment:   External Document

## 2010-04-24 NOTE — Medication Information (Signed)
Summary: rov/mw  Anticoagulant Therapy  Managed by: Weston Brass, PharmD Referring MD: Johney Frame PCP: Dr Juluis Pitch MD: Jens Som MD, Arlys John Indication 1: Atrial Fibrillation Lab Used: [facility] [place] Site: [address] INR POC 2.0 INR RANGE 2.0-3.0  Dietary changes: no    Health status changes: no    Bleeding/hemorrhagic complications: no    Recent/future hospitalizations: no    Any changes in medication regimen? no    Recent/future dental: no  Any missed doses?: no       Is patient compliant with meds? yes       Allergies: 1)  ! * Demerol 2)  ! Pcn 3)  ! Sulfa 4)  ! * Latex 5)  ! * Contrast Dye  Anticoagulation Management History:      The patient is taking warfarin and comes in today for a routine follow up visit.  Negative risk factors for bleeding include an age less than 37 years old.  The bleeding index is 'low risk'.  Negative CHADS2 values include Age > 52 years old.  Anticoagulation responsible provider: Jens Som MD, Arlys John.  INR POC: 2.0.  Cuvette Lot#: [id number].  Exp: 04/2011.    Anticoagulation Management Assessment/Plan:      The patient's current anticoagulation dose is Warfarin sodium 2 mg tabs: Use as directed by Anticoagualtion Clinic.  The target INR is 2.0-3.0.  The next INR is due 04/16/2010.  Anticoagulation instructions were given to patient.  Results were reviewed/authorized by Weston Brass, PharmD.  She was notified by Weston Brass PharmD.         Prior Anticoagulation Instructions: INR 2.8 Continue taking 3 tablets on tuesday and thursday. And 2 tablets all other days. Recheck INR in 4 weeks.   Current Anticoagulation Instructions: INR 2.0  Continue same dose of 2 tablets every day except 3 tablets on Tuesday and Thursday.  Recheck INR in 4 weeks.

## 2010-04-24 NOTE — Medication Information (Signed)
Summary: rov/sp  Anticoagulant Therapy  Managed by: Louann Sjogren, PharmD Referring MD: Johney Frame PCP: Dr Azucena Cecil Supervising MD: Patty Sermons MD Indication 1: Atrial Fibrillation Lab Used: [facility] [place] Site: [address] INR POC 2.2 INR RANGE 2.0-3.0  Dietary changes: no    Health status changes: no    Bleeding/hemorrhagic complications: no    Recent/future hospitalizations: no    Any changes in medication regimen? no    Recent/future dental: no  Any missed doses?: no       Is patient compliant with meds? yes       Allergies: 1)  ! * Demerol 2)  ! Pcn 3)  ! Sulfa 4)  ! * Latex 5)  ! * Contrast Dye  Anticoagulation Management History:      The patient is taking warfarin and comes in today for a routine follow up visit.  Negative risk factors for bleeding include an age less than 43 years old.  The bleeding index is 'low risk'.  Negative CHADS2 values include Age > 85 years old.  Today's INR is 2.2.  Anticoagulation responsible provider: Sanii Kukla MD.  INR POC: 2.2.  Cuvette Lot#: [id number].  Exp: 04/2011.    Anticoagulation Management Assessment/Plan:      The patient's current anticoagulation dose is Warfarin sodium 2 mg tabs: Use as directed by Anticoagualtion Clinic.  The target INR is 2.0-3.0.  The next INR is due 05/14/2010.  Anticoagulation instructions were given to patient.  Results were reviewed/authorized by Louann Sjogren, PharmD.         Prior Anticoagulation Instructions: INR 2.0  Continue same dose of 2 tablets every day except 3 tablets on Tuesday and Thursday.  Recheck INR in 4 weeks.   Current Anticoagulation Instructions: INR 2.2 (goal 2-3)  Continue taking 2 tablets everyday except take 3 tablets on Tuesdays and Thursdays. Recheck INR in 4 weeks. Prescriptions: WARFARIN SODIUM 2 MG TABS (WARFARIN SODIUM) Use as directed by Anticoagualtion Clinic  #200 Tablet x 0   Entered by:   Louann Sjogren PharmD   Authorized by:    Hillis Range, MD   Signed by:   Louann Sjogren PharmD on 04/16/2010   Method used:   Electronically to        [facility] [address] [phone]* (retail)       [address]       [place], [place]  [address]       Ph: [phone] or [phone]       Fax: [phone]   RxID:   [id number]

## 2010-05-13 DIAGNOSIS — I4891 Unspecified atrial fibrillation: Secondary | ICD-10-CM

## 2010-05-14 ENCOUNTER — Encounter (INDEPENDENT_AMBULATORY_CARE_PROVIDER_SITE_OTHER): Payer: Self-pay | Admitting: *Deleted

## 2010-05-14 ENCOUNTER — Encounter (INDEPENDENT_AMBULATORY_CARE_PROVIDER_SITE_OTHER): Payer: 59

## 2010-05-14 DIAGNOSIS — I4891 Unspecified atrial fibrillation: Secondary | ICD-10-CM

## 2010-05-14 DIAGNOSIS — Z7901 Long term (current) use of anticoagulants: Secondary | ICD-10-CM

## 2010-05-14 LAB — CONVERTED CEMR LAB: POC INR: 2.4

## 2010-05-20 NOTE — Letter (Signed)
Summary: [facility]: New Pt Evaluation  [facility]: New Pt Evaluation   Imported By: Earl Many 05/14/2010 09:27:35  _____________________________________________________________________  External Attachment:    Type:   Image     Comment:   External Document

## 2010-05-20 NOTE — Medication Information (Signed)
Summary: rov/sp  Anticoagulant Therapy  Managed by: Windell Hummingbird, RN Referring MD: Johney Frame PCP: Dr Juluis Pitch MD: Eden Emms MD, Theron Arista Indication 1: Atrial Fibrillation Lab Used: [facility] [place] Site: [address] INR POC 2.4 INR RANGE 2.0-3.0  Dietary changes: no    Health status changes: no    Bleeding/hemorrhagic complications: no    Recent/future hospitalizations: no    Any changes in medication regimen? no    Recent/future dental: no  Any missed doses?: no       Is patient compliant with meds? yes       Allergies: 1)  ! * Demerol 2)  ! Pcn 3)  ! Sulfa 4)  ! * Latex 5)  ! * Contrast Dye  Anticoagulation Management History:      The patient is taking warfarin and comes in today for a routine follow up visit.  Negative risk factors for bleeding include an age less than 52 years old.  The bleeding index is 'low risk'.  Negative CHADS2 values include Age > 93 years old.  Her last INR was 2.2.  Anticoagulation responsible provider: Eden Emms MD, Theron Arista.  INR POC: 2.4.  Cuvette Lot#: [id number].  Exp: 03/2011.    Anticoagulation Management Assessment/Plan:      The patient's current anticoagulation dose is Warfarin sodium 2 mg tabs: Use as directed by Anticoagualtion Clinic.  The target INR is 2.0-3.0.  The next INR is due 06/11/2010.  Anticoagulation instructions were given to patient.  Results were reviewed/authorized by Windell Hummingbird, RN.  She was notified by Windell Hummingbird, RN.         Prior Anticoagulation Instructions: INR 2.2 (goal 2-3)  Continue taking 2 tablets everyday except take 3 tablets on Tuesdays and Thursdays. Recheck INR in 4 weeks.  Current Anticoagulation Instructions: INR 2.4 Continue taking 2 tablets every day, except take 3 tablets on Tuesdays and Thursdays. Recheck in 4 weeks.  Appended Document: Coumadin Clinic    Anticoagulant Therapy  Managed by: Windell Hummingbird, RN Referring MD: Johney Frame PCP: Dr Juluis Pitch MD:  Eden Emms MD, Theron Arista Indication 1: Atrial Fibrillation Lab Used: [facility] [place] Site: [address] INR RANGE 2.0-3.0           Allergies: 1)  ! * Demerol 2)  ! Pcn 3)  ! Sulfa 4)  ! * Latex 5)  ! * Contrast Dye  Anticoagulation Management History:      Negative risk factors for bleeding include an age less than 50 years old.  The bleeding index is 'low risk'.  Negative CHADS2 values include Age > 64 years old.  Her last INR was 2.2.  Anticoagulation responsible provider: Eden Emms MD, Theron Arista.  Exp: 03/2011.    Anticoagulation Management Assessment/Plan:      The patient's current anticoagulation dose is Warfarin sodium 2 mg tabs: Use as directed by Anticoagualtion Clinic.  The target INR is 2.0-3.0.  The next INR is due 06/11/2010.  Anticoagulation instructions were given to patient.  Results were reviewed/authorized by Windell Hummingbird, RN.         Prior Anticoagulation Instructions: INR 2.4 Continue taking 2 tablets every day, except take 3 tablets on Tuesdays and Thursdays. Recheck in 4 weeks.

## 2010-05-27 ENCOUNTER — Emergency Department (HOSPITAL_COMMUNITY): Payer: 59

## 2010-05-27 ENCOUNTER — Observation Stay (HOSPITAL_COMMUNITY)
Admission: EM | Admit: 2010-05-27 | Discharge: 2010-05-28 | Disposition: A | Discharge status: Home / Self Care | Payer: 59 | Attending: Internal Medicine | Admitting: Internal Medicine

## 2010-05-27 ENCOUNTER — Telehealth: Payer: Self-pay | Admitting: Internal Medicine

## 2010-05-27 DIAGNOSIS — M26609 Unspecified temporomandibular joint disorder, unspecified side: Secondary | ICD-10-CM | POA: Insufficient documentation

## 2010-05-27 DIAGNOSIS — E785 Hyperlipidemia, unspecified: Secondary | ICD-10-CM | POA: Insufficient documentation

## 2010-05-27 DIAGNOSIS — E669 Obesity, unspecified: Secondary | ICD-10-CM | POA: Insufficient documentation

## 2010-05-27 DIAGNOSIS — R5381 Other malaise: Secondary | ICD-10-CM | POA: Insufficient documentation

## 2010-05-27 DIAGNOSIS — Z950 Presence of cardiac pacemaker: Secondary | ICD-10-CM | POA: Insufficient documentation

## 2010-05-27 DIAGNOSIS — Z7901 Long term (current) use of anticoagulants: Secondary | ICD-10-CM | POA: Insufficient documentation

## 2010-05-27 DIAGNOSIS — I4891 Unspecified atrial fibrillation: Secondary | ICD-10-CM | POA: Insufficient documentation

## 2010-05-27 DIAGNOSIS — R110 Nausea: Secondary | ICD-10-CM | POA: Insufficient documentation

## 2010-05-27 DIAGNOSIS — R61 Generalized hyperhidrosis: Secondary | ICD-10-CM | POA: Insufficient documentation

## 2010-05-27 DIAGNOSIS — R0989 Other specified symptoms and signs involving the circulatory and respiratory systems: Secondary | ICD-10-CM | POA: Insufficient documentation

## 2010-05-27 DIAGNOSIS — K219 Gastro-esophageal reflux disease without esophagitis: Secondary | ICD-10-CM | POA: Insufficient documentation

## 2010-05-27 DIAGNOSIS — M549 Dorsalgia, unspecified: Secondary | ICD-10-CM

## 2010-05-27 DIAGNOSIS — Z79899 Other long term (current) drug therapy: Secondary | ICD-10-CM | POA: Insufficient documentation

## 2010-05-27 DIAGNOSIS — I4892 Unspecified atrial flutter: Secondary | ICD-10-CM | POA: Insufficient documentation

## 2010-05-27 DIAGNOSIS — G4733 Obstructive sleep apnea (adult) (pediatric): Secondary | ICD-10-CM | POA: Insufficient documentation

## 2010-05-27 DIAGNOSIS — I2789 Other specified pulmonary heart diseases: Secondary | ICD-10-CM | POA: Insufficient documentation

## 2010-05-27 DIAGNOSIS — R5383 Other fatigue: Secondary | ICD-10-CM | POA: Insufficient documentation

## 2010-05-27 DIAGNOSIS — J45909 Unspecified asthma, uncomplicated: Secondary | ICD-10-CM | POA: Insufficient documentation

## 2010-05-27 DIAGNOSIS — R0609 Other forms of dyspnea: Secondary | ICD-10-CM | POA: Insufficient documentation

## 2010-05-27 LAB — CBC
Hemoglobin: 14.1 g/dL (ref 12.0–15.0)
RBC: 4.55 MIL/uL (ref 3.87–5.11)
WBC: 7.6 10*3/uL (ref 4.0–10.5)

## 2010-05-27 LAB — POCT I-STAT, CHEM 8
BUN: 15 mg/dL (ref 6–23)
Calcium, Ion: 1.09 mmol/L — ABNORMAL LOW (ref 1.12–1.32)
Chloride: 106 mEq/L (ref 96–112)
Creatinine, Ser: 0.8 mg/dL (ref 0.4–1.2)
Glucose, Bld: 113 mg/dL — ABNORMAL HIGH (ref 70–99)
HCT: 42 % (ref 36.0–46.0)
Hemoglobin: 14.3 g/dL (ref 12.0–15.0)
Potassium: 3.6 meq/L (ref 3.5–5.1)
Sodium: 138 mEq/L (ref 135–145)
TCO2: 23 mmol/L (ref 0–100)

## 2010-05-27 LAB — POCT CARDIAC MARKERS
CKMB, poc: <1 ng/mL — ABNORMAL LOW (ref 1.0–8.0)
Myoglobin, poc: 41.1 ng/mL (ref 12–200)
Troponin i, poc: <0.05 ng/mL (ref 0.00–0.09)

## 2010-05-27 LAB — PROTIME-INR: INR: 1.84 — ABNORMAL HIGH (ref 0.00–1.49)

## 2010-05-27 LAB — TROPONIN I: Troponin I: 0.02 ng/mL (ref 0.00–0.06)

## 2010-05-27 LAB — HEPARIN LEVEL (UNFRACTIONATED): Heparin Unfractionated: 0.22 IU/mL — ABNORMAL LOW (ref 0.30–0.70)

## 2010-05-27 LAB — CK TOTAL AND CKMB (NOT AT ARMC)
CK, MB: 0.6 ng/mL (ref 0.3–4.0)
Relative Index: INVALID (ref 0.0–2.5)
Total CK: 51 U/L (ref 7–177)

## 2010-05-27 LAB — HEMOGLOBIN A1C
Hgb A1c MFr Bld: 5.7 % — ABNORMAL HIGH (ref <5.7)
Mean Plasma Glucose: 117 mg/dL — ABNORMAL HIGH (ref <117)

## 2010-05-28 DIAGNOSIS — R072 Precordial pain: Secondary | ICD-10-CM

## 2010-05-28 LAB — HEPARIN LEVEL (UNFRACTIONATED): Heparin Unfractionated: 0.29 IU/mL — ABNORMAL LOW (ref 0.30–0.70)

## 2010-05-28 LAB — COMPREHENSIVE METABOLIC PANEL
ALT: 23 U/L (ref 0–35)
AST: 24 U/L (ref 0–37)
Alkaline Phosphatase: 72 U/L (ref 39–117)
CO2: 25 mEq/L (ref 19–32)
Chloride: 101 mEq/L (ref 96–112)
GFR calc Af Amer: >60 mL/min (ref >60)
GFR calc non Af Amer: >60 mL/min (ref >60)
Glucose, Bld: 91 mg/dL (ref 70–99)
Potassium: 3.8 mEq/L (ref 3.5–5.1)
Sodium: 139 mEq/L (ref 135–145)
Total Bilirubin: 0.4 mg/dL (ref 0.3–1.2)

## 2010-05-28 LAB — CARDIAC PANEL(CRET KIN+CKTOT+MB+TROPI)
CK, MB: 0.6 ng/mL (ref 0.3–4.0)
Relative Index: INVALID (ref 0.0–2.5)
Relative Index: INVALID (ref 0.0–2.5)
Troponin I: 0.01 ng/mL (ref 0.00–0.06)

## 2010-05-28 LAB — CBC
HCT: 38.1 % (ref 36.0–46.0)
Hemoglobin: 12.7 g/dL (ref 12.0–15.0)
MCH: 30.2 pg (ref 26.0–34.0)
MCHC: 33.3 g/dL (ref 30.0–36.0)
RDW: 13.2 % (ref 11.5–15.5)
WBC: 7.1 10*3/uL (ref 4.0–10.5)

## 2010-05-28 LAB — LIPID PANEL: HDL: 57 mg/dL (ref >39)

## 2010-05-28 LAB — PROTIME-INR: INR: 2.13 — ABNORMAL HIGH (ref 0.00–1.49)

## 2010-05-28 LAB — TSH: TSH: 1.447 u[IU]/mL (ref 0.350–4.500)

## 2010-05-29 ENCOUNTER — Telehealth (INDEPENDENT_AMBULATORY_CARE_PROVIDER_SITE_OTHER): Payer: Self-pay | Admitting: *Deleted

## 2010-06-02 ENCOUNTER — Encounter: Payer: Self-pay | Admitting: Cardiology

## 2010-06-02 ENCOUNTER — Ambulatory Visit (HOSPITAL_COMMUNITY): Payer: 59 | Attending: Cardiology

## 2010-06-02 DIAGNOSIS — R0602 Shortness of breath: Secondary | ICD-10-CM

## 2010-06-02 DIAGNOSIS — R079 Chest pain, unspecified: Secondary | ICD-10-CM | POA: Insufficient documentation

## 2010-06-02 DIAGNOSIS — I4891 Unspecified atrial fibrillation: Secondary | ICD-10-CM

## 2010-06-03 NOTE — Progress Notes (Signed)
Summary: chest and jaw pain.  Phone Note Call from Patient Call back at Home Phone [phone]   Caller: Patient Reason for Call: Talk to Nurse Summary of Call: pt having jaw pain and chest pain since yesterday. no sob.  Initial call taken by: Roe Coombs,  May 27, 2010 12:10 PM  Follow-up for Phone Call        Patient is having chest discomfort  between shoulder blades. She is also having jaw pain. She did have mild diaphoresis that decreased with rest. She denies SOB. Advised patient I would speak with the DOD and see if she can come into the office for an EKG.   I spoke with Dr. Antoine Poche and he suggested the patient go to the ER. Trish  & the ER notified. Follow-up by: Whitney Maeola Sarah RN,  May 27, 2010 12:29 PM

## 2010-06-03 NOTE — Progress Notes (Signed)
Summary: Nuclear Pre-Procedure  Phone Note Outgoing Call Call back at [facility] Phone [phone]   Call placed by: Stanton Kidney, EMT-P,  May 29, 2010 2:51 PM Action Taken: Phone Call Completed Summary of Call: Left message with information on [employer] Information Sheet (see scanned document for details); per Allen Kell.     Nuclear Med Background Indications for Stress Test: Evaluation for Ischemia, Post Hospital  Indications Comments: Discharged 05/28/10  History: Asthma, Echo  History Comments: 3/12 Echo: NL     Nuclear Pre-Procedure Cardiac Risk Factors: Lipids Height (in): 64  Nuclear Med Study Referring MD:  Allred

## 2010-06-04 ENCOUNTER — Telehealth: Payer: Self-pay | Admitting: Internal Medicine

## 2010-06-10 NOTE — Assessment & Plan Note (Addendum)
Summary: Cardiology Nuclear Testing  Nuclear Med Background Indications for Stress Test: Evaluation for Ischemia, Post Hospital  Indications Comments: Discharged 05/28/10  History: Asthma, Echo  History Comments: 3/12 Echo: NL  Symptoms: Chest Pain, DOE, Fatigue, Palpitations    Nuclear Pre-Procedure Cardiac Risk Factors: Lipids Caffeine/Decaff Intake: None NPO After: 6:00 PM Lungs: clear IV 0.9% NS with Angio Cath: 20g     IV Site: L Antecubital IV Started by: Irean Hong, RN Chest Size (in) 40     Cup Size D     Height (in): 65 Weight (lb): 222 BMI: 37.08  Nuclear Med Study 1 or 2 day study:  1 day     Stress Test Type:  Treadmill/Lexiscan Reading MD:  Olga Millers, MD     Referring MD:  Allred Resting Radionuclide:  Technetium 4m Tetrofosmin     Resting Radionuclide Dose:  10.6 mCi  Stress Radionuclide:  Technetium 9m Tetrofosmin     Stress Radionuclide Dose:  33 mCi   Stress Protocol  Max Systolic BP: 154 mm Hg Lexiscan: 0.4 mg   Stress Test Technologist:  Milana Na, EMT-P     Nuclear Technologist:  Domenic Polite, CNMT  Rest Procedure  Myocardial perfusion imaging was performed at rest 45 minutes following the intravenous administration of Technetium 74m Tetrofosmin.  Stress Procedure  The patient received IV Lexiscan 0.4 mg over 15-seconds with concurrent low level exercise and then Technetium 36m Tetrofosmin was injected at 30-seconds while the patient continued walking one more minute.  There were no significant changes with Lexiscan.  Quantitative spect images were obtained after a 45 minute delay.  QPS Raw Data Images:  There is a breast shadow that accounts for the anterior attenuation. Stress Images:  There is decreased uptake in the anterior wall. Rest Images:  There is decreased uptake in the anterior wall. Subtraction (SDS):  No evidence of ischemia. Transient Ischemic Dilatation:  .99  (Normal <1.22)  Lung/Heart Ratio:  .41  (Normal  <0.45)  Quantitative Gated Spect Images QGS EDV:  69 ml QGS ESV:  12 ml QGS EF:  82 % QGS cine images:  Normal wall motion.   Overall Impression  Exercise Capacity: Lexiscan with no exercise. BP Response: Normal blood pressure response. Clinical Symptoms: No chest pain ECG Impression: No significant ST segment change suggestive of ischemia. Overall Impression: Normal lexiscan nuclear study with breast attenuation but no ischemia.  Appended Document: Cardiology Nuclear Testing please inform pt of low risk study  Appended Document: Cardiology Nuclear Testing pt aware*

## 2010-06-10 NOTE — Progress Notes (Signed)
Summary: stress test results  Phone Note Call from Patient Call back at Home Phone [phone]   Caller: Patient Summary of Call: Stress test results Initial call taken by: Judie Grieve,  June 04, 2010 8:48 AM  Follow-up for Phone Call        pt aware stress test normal Dennis Bast, RN, BSN  June 04, 2010 10:08 AM

## 2010-06-11 ENCOUNTER — Ambulatory Visit (INDEPENDENT_AMBULATORY_CARE_PROVIDER_SITE_OTHER): Payer: 59 | Admitting: *Deleted

## 2010-06-11 DIAGNOSIS — I4891 Unspecified atrial fibrillation: Secondary | ICD-10-CM

## 2010-06-11 LAB — POCT INR: INR: 2.7

## 2010-06-11 NOTE — Patient Instructions (Signed)
INR 2.7 Continue 4mg s daily except 6mg s on Tuesdays and Thursdays. Recheck in 4 weeks.

## 2010-06-18 ENCOUNTER — Emergency Department (HOSPITAL_COMMUNITY): Payer: 59

## 2010-06-18 ENCOUNTER — Emergency Department (HOSPITAL_COMMUNITY)
Admission: EM | Admit: 2010-06-18 | Discharge: 2010-06-19 | Discharge status: Against Medical Advice | Payer: 59 | Attending: Emergency Medicine | Admitting: Emergency Medicine

## 2010-06-18 DIAGNOSIS — M549 Dorsalgia, unspecified: Secondary | ICD-10-CM | POA: Insufficient documentation

## 2010-06-18 DIAGNOSIS — R0989 Other specified symptoms and signs involving the circulatory and respiratory systems: Secondary | ICD-10-CM | POA: Insufficient documentation

## 2010-06-18 DIAGNOSIS — R002 Palpitations: Secondary | ICD-10-CM | POA: Insufficient documentation

## 2010-06-18 DIAGNOSIS — I4891 Unspecified atrial fibrillation: Secondary | ICD-10-CM | POA: Insufficient documentation

## 2010-06-18 DIAGNOSIS — Z7901 Long term (current) use of anticoagulants: Secondary | ICD-10-CM | POA: Insufficient documentation

## 2010-06-18 DIAGNOSIS — R0609 Other forms of dyspnea: Secondary | ICD-10-CM | POA: Insufficient documentation

## 2010-06-18 DIAGNOSIS — J45909 Unspecified asthma, uncomplicated: Secondary | ICD-10-CM | POA: Insufficient documentation

## 2010-06-18 DIAGNOSIS — F3289 Other specified depressive episodes: Secondary | ICD-10-CM | POA: Insufficient documentation

## 2010-06-18 DIAGNOSIS — R0602 Shortness of breath: Secondary | ICD-10-CM | POA: Insufficient documentation

## 2010-06-18 DIAGNOSIS — R079 Chest pain, unspecified: Secondary | ICD-10-CM | POA: Insufficient documentation

## 2010-06-18 DIAGNOSIS — K219 Gastro-esophageal reflux disease without esophagitis: Secondary | ICD-10-CM | POA: Insufficient documentation

## 2010-06-18 DIAGNOSIS — R000 Tachycardia, unspecified: Secondary | ICD-10-CM | POA: Insufficient documentation

## 2010-06-18 DIAGNOSIS — F329 Major depressive disorder, single episode, unspecified: Secondary | ICD-10-CM | POA: Insufficient documentation

## 2010-06-18 LAB — BASIC METABOLIC PANEL
BUN: 20 mg/dL (ref 6–23)
BUN: 19 mg/dL (ref 6–23)
Calcium: 9.6 mg/dL (ref 8.4–10.5)
Creatinine, Ser: 0.8 mg/dL (ref 0.4–1.2)
GFR calc non Af Amer: >60 mL/min (ref >60)
GFR calc non Af Amer: >60 mL/min (ref >60)
Glucose, Bld: 130 mg/dL — ABNORMAL HIGH (ref 70–99)
Potassium: 4.4 mEq/L (ref 3.5–5.1)
Sodium: 135 mEq/L (ref 135–145)

## 2010-06-18 LAB — POCT CARDIAC MARKERS
CKMB, poc: <1 ng/mL — ABNORMAL LOW (ref 1.0–8.0)
Myoglobin, poc: 32.8 ng/mL (ref 12–200)

## 2010-06-18 LAB — CBC
HCT: 45.4 % (ref 36.0–46.0)
RBC: 5.04 MIL/uL (ref 3.87–5.11)
RDW: 13.3 % (ref 11.5–15.5)
WBC: 15.9 10*3/uL — ABNORMAL HIGH (ref 4.0–10.5)

## 2010-06-18 LAB — DIFFERENTIAL
Basophils Absolute: 0 10*3/uL (ref 0.0–0.1)
Eosinophils Relative: 0 % (ref 0–5)
Lymphocytes Relative: 12 % (ref 12–46)
Neutro Abs: 12.8 10*3/uL — ABNORMAL HIGH (ref 1.7–7.7)
Neutrophils Relative %: 80 % — ABNORMAL HIGH (ref 43–77)

## 2010-06-18 LAB — PROTIME-INR: Prothrombin Time: 30 seconds — ABNORMAL HIGH (ref 11.6–15.2)

## 2010-06-19 ENCOUNTER — Other Ambulatory Visit (HOSPITAL_COMMUNITY): Payer: Self-pay | Admitting: Family Medicine

## 2010-06-19 DIAGNOSIS — R42 Dizziness and giddiness: Secondary | ICD-10-CM

## 2010-06-19 MED ORDER — IOHEXOL 300 MG/ML  SOLN
100.0000 mL | Freq: Once | INTRAMUSCULAR | Status: AC | PRN
  Administered 2010-06-19: 100 mL via INTRAVENOUS

## 2010-06-21 ENCOUNTER — Ambulatory Visit (HOSPITAL_COMMUNITY)
Admission: RE | Admit: 2010-06-21 | Discharge: 2010-06-21 | Disposition: A | Discharge status: Home / Self Care | Payer: 59 | Source: Ambulatory Visit | Attending: Family Medicine | Admitting: Family Medicine

## 2010-06-21 DIAGNOSIS — R42 Dizziness and giddiness: Secondary | ICD-10-CM

## 2010-06-22 ENCOUNTER — Ambulatory Visit (HOSPITAL_COMMUNITY)
Admission: RE | Admit: 2010-06-22 | Discharge: 2010-06-22 | Disposition: A | Discharge status: Home / Self Care | Payer: 59 | Source: Ambulatory Visit | Attending: Family Medicine | Admitting: Family Medicine

## 2010-06-22 DIAGNOSIS — R930 Abnormal findings on diagnostic imaging of skull and head, not elsewhere classified: Secondary | ICD-10-CM | POA: Insufficient documentation

## 2010-06-22 DIAGNOSIS — R42 Dizziness and giddiness: Secondary | ICD-10-CM | POA: Insufficient documentation

## 2010-06-23 ENCOUNTER — Other Ambulatory Visit (HOSPITAL_COMMUNITY): Payer: Self-pay | Admitting: Family Medicine

## 2010-06-23 DIAGNOSIS — R42 Dizziness and giddiness: Secondary | ICD-10-CM

## 2010-06-25 ENCOUNTER — Other Ambulatory Visit (HOSPITAL_COMMUNITY): Payer: Self-pay | Admitting: Family Medicine

## 2010-06-25 ENCOUNTER — Ambulatory Visit (HOSPITAL_COMMUNITY)
Admission: RE | Admit: 2010-06-25 | Discharge: 2010-06-25 | Disposition: A | Discharge status: Home / Self Care | Payer: 59 | Source: Ambulatory Visit | Attending: Family Medicine | Admitting: Family Medicine

## 2010-06-25 DIAGNOSIS — R42 Dizziness and giddiness: Secondary | ICD-10-CM

## 2010-06-26 ENCOUNTER — Telehealth: Payer: Self-pay | Admitting: Internal Medicine

## 2010-06-26 NOTE — Telephone Encounter (Signed)
Pt states she has been a-fib for a couple of days. Pt wants to talk to a nurse.

## 2010-06-26 NOTE — Telephone Encounter (Signed)
Spoke with patient  And let her know to continue everything as scheduled and keep dollow up on 07/09/10

## 2010-06-27 NOTE — H&P (Signed)
[name], [name]              ACCOUNT NO.:  [id number]  MEDICAL RECORD NO.:  [id number]           PATIENT TYPE:  I  LOCATION:  2040                         FACILITY:  [facility]  PHYSICIAN:  Doylene Canning. Ladona Ridgel, MD    DATE OF BIRTH:  28-Jul-1955  DATE OF ADMISSION:  05/27/2010 DATE OF DISCHARGE:                             HISTORY & PHYSICAL   PRIMARY CARDIOLOGIST:  Hillis Range, MD  PRIMARY CARE PROVIDER:  Dr. Azucena Cecil.  PATIENT PROFILE:  A 55 year old female with prior history of atrial fibrillation status post pulmonary vein isolation/ablation in [place] in January 2010, who presents with a 2-day history of upper back pain problems: 1. Upper back/scapular pain with radiation to the jaw.     a.     History of catheterization for similar symptoms February 22, 2004, showing normal coronary arteries with EF of 65%.     b.     Reportedly normal stress test in September 2009 in [place]. 2. Paroxysmal atrial fibrillation.     a.     Status post pulmonary venous isolation at [facility],      January 2010.     b.     Currently on Coumadin and flecainide. 3. History of pericardial effusion.     a.     A 2-D echocardiogram, June 04, 2009, no evidence of      effusion.  EF 55-60%.  Mild TR and MR. 4. Pulmonary hypertension. 5. Obesity. 6. Obstructive sleep apnea with CPAP noncompliance. 7. Asthma. 8. GERD. 9. Hyperlipidemia. 10.History of atrial flutter. 11.History of wandering atrial pacemaker. 12.Allergic rhinitis. 13.TMJ syndrome status post surgical correction. 14.Carpal tunnel syndrome status post release. 15.Status post C-section. 16.Status post cholecystectomy. 17.Status post inguinal hernia repair.  ALLERGIES:  INTRAVENOUS CONTRAST, DEMEROL, LATEX, PENICILLIN, and SULFA.  HISTORY OF PRESENT ILLNESS:  A 55 year old female with the above complex problem.  She has a history of atrial fibrillation and status post pulmonary vein isolation and ablation in January 2010  at [facility]. With her prior episodes of atrial fibrillation, she would also experience mid back and scapular pain with nausea, shortness of breath, malaise, and fatigue.  Because of this, she underwent catheterization in December 2005, showing normal coronary arteries.  She reportedly also had negative Myoview in 2009.  She is not aware of any recurrence of palpitations, atrial fibrillation starting on this past Sunday morning. She began to have a constant 6-10 mid-upper back/scapular pain associated with malaise, fatigue, and nausea, similar to what she used to experience with AFib.  Symptoms did not change with exertion, though she has been limiting her activity.  Pain persisted over the course of the past 48 hours and was worse this morning associated with radiation to the jaw.  Because of this she called into the office and was advised to present to the ED.  Here she is in sinus rhythm without any acute ST- T changes.  She is treated with morphine with almost complete relief of pain.  Point-of-care markers negative x1.  HOME MEDICATIONS: 1. Coumadin as directed. 2. Flecainide 100 mg b.i.d. 3. Verapamil 160  mg b.i.d. 4. Losartan 50 mg daily. 5. Asmanex 220 mcg 1 puff b.i.d. 6. Beconase nasal AQ 42 mcg 1 spray each nostril b.i.d. 7. Zyrtec 10 mg daily. 8. Prilosec 40 mg daily. 9. Citracal daily. 10.Vitamin D 1000 units daily. 11.Zoloft 50 mg daily. 12.Multivitamin daily.  FAMILY HISTORY:  Mother is alive at 73 with a history of MI at 1 and also diabetes.  Father is alive at 54 with history of arthritis and recent knee replacement.  She has a sister with history of lupus and stroke and other sister with bipolar disorder, another sister is alive and well.  SOCIAL HISTORY:  The patient lives in [place] with her husband.  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  She denies tobacco or drug use and rarely will have a glass of wine.  She is not  routinely exercising.  REVIEW OF SYSTEMS:  Positive for mid-scapular back pain with radiation to the jaw today along with generalized malaise, fatigue, diaphoresis, occasional nausea, and dyspnea.  Otherwise all systems reviewed is negative.  She is a full code.  PHYSICAL EXAMINATION:  VITAL SIGNS:  Temperature 97.8, heart rate 66, respirations 20, blood pressure 123/60, pulse ox is 98% on room air. VITAL SIGNS:  A pleasant white female in no acute distress.  Awake, alert, and oriented x3.  She has a normal affect. HEENT: Normal nares, grossly intact nonfocal. SKIN:  Warm and dry without lesions or masses. NECK:  Supple without bruits or JVD. LUNGS:  Respirations were regular and unlabored, clear to auscultation. CARDIAC:  Regular S1 and S2.  No S3 or S4 with a 2/6 systolic murmur throughout the sternal border. ABDOMEN:  Round, soft, nontender, nondistended.  Bowel sounds present x4. EXTREMITIES:  Warm, dry, pink.  No clubbing, cyanosis, or edema. Dorsalis pedis, posterior tibial pulses 2+ and equal bilaterally.  Chest x-ray shows no acute findings.  EKG shows sinus rhythm, rate of 70, normal axis.  She is Q in lead III and possible left atrial enlargement.  No acute ST or T changes.  Hemoglobin 14.1, hematocrit 41.5, WBC 7.6, platelets 328.  Sodium 138, potassium 3.6, chloride 106, CO2 of 23, BUN 15, creatinine 0.8, glucose 113, CK-MB less than 1.02, troponin-I less than 0.05, INR 1.84.  ASSESSMENT AND PLAN: 1. Back/midscapular pain worse this morning with radiation to the jaw,     but now relieved with morphine.  ECG is nonacute.  Point-of-care     markers negative x1 despite greater than 48 hours of persistent     pain.  Chest x-ray is without any evidence of increased mediastinum     or cardiac silhouette.  Plan to admit and cycle enzymes.  Check     echo, given prior history of pericardial effusion.  No effusion was     noted on last echo on March 2011.  If enzymes remain  negative, echo     is unrevealing, and she has no recurrent chest pain, consider early     discharge with outpatient Myoview. 2. Paroxysmal atrial fibrillation.  The patient status post catheter     ablation for atrial fibrillation in January 2010.  She has not had     any recent palpitations.  Continue flecainide and verapamil.  We     will hold Coumadin tonight in case patient requiring     catheterization.  I will add heparin with subtherapeutic INR. 3. History of pericardial effusion.  Follow up 2-D echocardiogram as  above.     Nicolasa Ducking, ANP   ______________________________ Doylene Canning. Ladona Ridgel, MD    CB/MEDQ  D:  05/27/2010  T:  05/28/2010  Job:  [id number]  Electronically Signed by Nicolasa Ducking ANP on 06/09/2010 12:02:01 PM Electronically Signed by Lewayne Bunting MD on 06/27/2010 06:50:40 AM

## 2010-07-07 NOTE — Discharge Summary (Signed)
[name], [name]              ACCOUNT NO.:  [id number]  MEDICAL RECORD NO.:  [id number]           PATIENT TYPE:  I  LOCATION:  2040                         FACILITY:  [facility]  PHYSICIAN:  Kelly Range, MD       DATE OF BIRTH:  06/23/1955  DATE OF ADMISSION:  05/27/2010 DATE OF DISCHARGE:  05/28/2010                              DISCHARGE SUMMARY   PRIMARY CARDIOLOGIST:  Kelly Range, MD  PRIMARY CARE PROVIDER:  Tally Joe, MD  DISCHARGE DIAGNOSIS:  Back pain without objective evidence of ischemia.  SECONDARY DIAGNOSES: 1. History of paroxysmal atrial fibrillation, status post pulmonary     vein isolation at [facility] in January 2010. 2. History of pericardial effusion. 3. Pulmonary hypertension. 4. Obesity. 5. Obstructive sleep apnea with continuous positive airway pressure     noncompliance. 6. Asthma. 7. Gastroesophageal reflux disease. 8. Hyperlipidemia. 9. History of atrial flutter. 10.History of wandering atrial pacemaker. 11.Allergic rhinitis. 12.Temporomandibular joint syndrome, status post surgical correction. 13.Carpal tunnel syndrome, status post release. 14.Status post cesarean section. 15.Status post cholecystectomy. 16.Status post inguinal hernia repair.  ALLERGIES: 1. INTRAVENOUS CONTRAST. 2. DEMEROL. 3. LATEX. 4. PENICILLIN. 5. SULFA.  PROCEDURES:  A 2-D echocardiogram with results pending at the time of this dictation.  HISTORY OF PRESENT ILLNESS:  A 55 year old female with prior history of paroxysmal atrial fibrillation, status post pulmonary vein isolation and ablation at [facility] in January 2010.  The patient was in her usual state of health until approximately 2 days prior to admission when she began to experience upper back and midscapular pain associated with general malaise, fatigue and nausea.  She recognized this symptoms is being similar to what she experienced in the past with atrial fibrillation except she did not have  any tachypalpitation at this time. Symptoms persisted over the course of the next 48 hours and worsened on the day of admission and became associated with jaw pain.  This prompted her to call our office and she was subsequently advised to present to the ED.  In the ED, she was in sinus rhythm and pain was relieved with intravenous morphine.  ECG showed no acute changes and she was admitted for further evaluation.  HOSPITAL COURSE:  The patient ruled out for MI.  She has had no recurrent chest pain.  Given her prior history of pericardial effusion which was not present on most recent echo in March 2011, we did obtain an echocardiogram this admission.  Those results are currently pending. Provided that echocardiogram is unrevealing of acute abnormalities, the patient will be discharged home today in good condition.  We have arranged for her to have a Lexiscan Myoview in our office on Monday, June 02, 2010 at 8:15 a.m.  DISCHARGE LABS:  Hemoglobin 12.7, hematocrit 38.1, WBC 7.1, platelets 333.  Sodium 139, potassium 3.8, chloride 101, CO2 25, BUN 10, creatinine 0.68, glucose 91, total bilirubin 0.4, alkaline phosphatase 72, AST 24, ALT 23, total protein 6.0, albumin 3.7, calcium 8.8, hemoglobin A1c 5.7.  CK 50, MB 0.6, troponin-I 0.01.  Total cholesterol 219, triglycerides 230, HDL 57, LDL 116.  DISPOSITION:  The patient  will be discharged to home today in good condition.  FOLLOWUP PLANS AND APPOINTMENTS:  The patient will undergo Lexiscan Myoview on Monday, June 02, 2010 at 8:15 a.m.  She will follow up with [name] on July 09, 2010 at 9:45 a.m. or sooner if necessary.  She will follow up with Dr. Azucena [name] as scheduled.  Also arrange for [facility] followup.  DISCHARGE MEDICATIONS: 1. Asmanex 110 mcg one puff b.i.d. 2. Beconase nasal 0.042 mg two sprays b.i.d. 3. Cetirizine 10 mg daily. 4. Citracal one tablet daily. 5. Coumadin 6 mg Tuesday, Thursday  and 4 mg Monday, Wednesday, Friday,     Saturday, Sunday. 6. Femhrt 2.5 mcg - 0.5 mg one tablet daily. 7. Flecainide 100 mg b.i.d. 8. Losartan 50 mg daily. 9. Multivitamin one tablet daily. 10.Prilosec 40 mg daily. 11.Verapamil 160 mg b.i.d. 12.Vitamin D3 1000 units daily. 13.Zoloft 50 mg daily.  OUTSTANDING LABORATORY STUDIES:  At the time of this dictation, echocardiogram is pending.  Lexiscan Myoview scheduled for next week.  DURATION OF DISCHARGE ENCOUNTER:  Forty five minutes including physician time.     [name], ANP   ______________________________ Kelly Range, MD    CB/MEDQ  D:  05/28/2010  T:  05/29/2010  Job:  [id number]  cc:   [name], M.D.  Electronically Signed by [name] ANP on 06/09/2010 12:01:50 PM Electronically Signed by Kelly Range MD on 07/07/2010 09:18:53 AM

## 2010-07-07 NOTE — Discharge Summary (Signed)
  NAMEDARCEE, DEKKER              ACCOUNT NO.:  [id number]  MEDICAL RECORD NO.:  [id number]           PATIENT TYPE:  I  LOCATION:  2040                         FACILITY:  [facility]  PHYSICIAN:  Hillis Range, MD       DATE OF BIRTH:  Jul 22, 1955  DATE OF ADMISSION:  05/27/2010 DATE OF DISCHARGE:  05/28/2010                              DISCHARGE SUMMARY   ADDENDUM  PRIMARY CARDIOLOGIST:  Hillis Range, MD  PRIMARY CARE PROVIDER:  Dr. Tally Joe.  The patient's echocardiogram shows normal LV function without evidence of pericardial effusion.  She is being discharged home today as previously described in good condition.     Nicolasa Ducking, ANP   ______________________________ Hillis Range, MD    CB/MEDQ  D:  05/28/2010  T:  05/28/2010  Job:  [id number]  cc:   Tally Joe, M.D.  Electronically Signed by Nicolasa Ducking ANP on 06/09/2010 12:01:55 PM Electronically Signed by Hillis Range MD on 07/07/2010 09:18:55 AM

## 2010-07-08 ENCOUNTER — Encounter: Payer: Self-pay | Admitting: Internal Medicine

## 2010-07-09 ENCOUNTER — Ambulatory Visit (INDEPENDENT_AMBULATORY_CARE_PROVIDER_SITE_OTHER): Payer: 59 | Admitting: Internal Medicine

## 2010-07-09 ENCOUNTER — Encounter: Payer: Self-pay | Admitting: *Deleted

## 2010-07-09 ENCOUNTER — Ambulatory Visit (INDEPENDENT_AMBULATORY_CARE_PROVIDER_SITE_OTHER): Payer: 59 | Admitting: *Deleted

## 2010-07-09 ENCOUNTER — Encounter: Payer: Self-pay | Admitting: Internal Medicine

## 2010-07-09 VITALS — BP 100/60 | HR 78 | Ht 65.0 in | Wt 225.0 lb

## 2010-07-09 DIAGNOSIS — I4891 Unspecified atrial fibrillation: Secondary | ICD-10-CM

## 2010-07-09 DIAGNOSIS — E7800 Pure hypercholesterolemia, unspecified: Secondary | ICD-10-CM

## 2010-07-09 DIAGNOSIS — R42 Dizziness and giddiness: Secondary | ICD-10-CM | POA: Insufficient documentation

## 2010-07-09 LAB — CBC WITH DIFFERENTIAL/PLATELET
Eosinophils Absolute: 0.1 10*3/uL (ref 0.0–0.7)
Eosinophils Relative: 1 % (ref 0.0–5.0)
MCHC: 34.8 g/dL (ref 30.0–36.0)
MCV: 91.2 fl (ref 78.0–100.0)
Monocytes Absolute: 0.5 10*3/uL (ref 0.1–1.0)
Neutrophils Relative %: 53.6 % (ref 43.0–77.0)
Platelets: 343 10*3/uL (ref 150.0–400.0)
WBC: 5.8 10*3/uL (ref 4.5–10.5)

## 2010-07-09 LAB — BASIC METABOLIC PANEL
GFR: 86.23 mL/min (ref >60.00)
Potassium: 3.9 mEq/L (ref 3.5–5.1)
Sodium: 141 mEq/L (ref 135–145)

## 2010-07-09 NOTE — Assessment & Plan Note (Signed)
Stable No changes 

## 2010-07-09 NOTE — Assessment & Plan Note (Signed)
Unfortunately, she has returned to afib. She has been adequately anticoagulated with coumadin. We will therefore plan cardioversion.  No medicine changes at this time. Risks/ benefits/ alternatives to cardioversion were discussed with the patient who wishes to proceed.  We will schedule at the next available time.

## 2010-07-09 NOTE — Assessment & Plan Note (Signed)
Unclear etiology, most likely due to vestibular or neuro issue.  MRI results from early April were discussed with pt.  She and PCP continue to evaluated these findings with assistance of ENT.  She is considering neurology consultation which I think would be reasonable.  Flecainide could cause dizziness, however, she has been on this medicine a longtime, without issues. No changes today.

## 2010-07-09 NOTE — Progress Notes (Signed)
The patient presents today for electrophysiology followup.  Since last being seen in our clinic, she has had difficulty with atypical chest pain and dizziness.  She was seen by me in the hospital.  Nuclear study revealed no ischemia.  She subsequently was evaluated by Dr Nicholos Johns and was placed on prednisone for her dizziness.  Her dizziness initially improved.  Unfortunately, she has subsequently returned to afib.  She reports that she has been in afib for several weeks.  She reports palpitations with decreased exercise tolerance and BLE edema. Her dizziness is stable. Today, she denies symptoms of chest pain, orthopnea, PND,  presyncope, syncope, or neurologic sequela.  The patient feels that she is tolerating medications without difficulties and is otherwise without complaint today.   Past Medical History  Diagnosis Date  . Allergic rhinitis   . Carpal tunnel syndrome   . TMJ syndrome   . Hypercholesteremia   . GERD (gastroesophageal reflux disease)   . Asthma   . PAF (paroxysmal atrial fibrillation)     s/p afib ablation at [facility] 2010  . Rheumatoid arthritis   . OSA on CPAP     non compliant  . Obesity    Past Surgical History  Procedure Date  . Cesarean section   . Carpal tunnel release   . Inguinal hernia repair   . Cholecystectomy   . Tmj arthroplasty   . Afib ablation 2010    at [facility]    Current Outpatient Prescriptions  Medication Sig Dispense Refill  . ASMANEX 60 METERED DOSES 220 MCG/INH inhaler 1 spray as directed.      . beclomethasone (BECONASE-AQ) 42 MCG/SPRAY nasal spray 2 sprays by Nasal route 2 (two) times daily. Dose is for each nostril.       . calcium-vitamin D 250-100 MG-UNIT per tablet Take 1 tablet by mouth 2 (two) times daily.        . cetirizine (ZYRTEC) 10 MG tablet Take 10 mg by mouth daily.        . Cholecalciferol (VITAMIN D3) 1000 UNITS CAPS Take by mouth daily.        . flecainide (TAMBOCOR) 100 MG tablet Take 1 tablet by mouth Twice  daily.      Marland Kitchen losartan (COZAAR) 50 MG tablet Take 1 tablet by mouth daily.      . Multiple Vitamin (MULTIVITAMIN) tablet Take 1 tablet by mouth daily.        Marland Kitchen omeprazole (PRILOSEC) 40 MG capsule Take 1 tablet by mouth daily.      . sertraline (ZOLOFT) 50 MG tablet Take 1 tablet by mouth daily.      . verapamil (CALAN) 80 MG tablet Take 2 tablets by mouth 3 (three) times daily.       Marland Kitchen warfarin (COUMADIN) 2 MG tablet Take by mouth as directed.          Allergies  Allergen Reactions  . Latex   . Meperidine Hcl   . Penicillins   . Sulfonamide Derivatives     History   Social History  . Marital Status: Married    Spouse Name: N/A    Number of Children: N/A  . Years of Education: N/A   Occupational History  . Not on file.   Social History Main Topics  . Smoking status: Never Smoker   . Smokeless tobacco: Not on file  . Alcohol Use: Yes     wine rarely  . Drug Use: No  . Sexually Active: Not on file  Other Topics Concern  . Not on file   Social History Narrative  . No narrative on file    Family History  Problem Relation Age of Onset  . Lupus    . Stroke    . Heart attack      ROS-  All systems are reviewed and are negative except as outlined in the HPI above  Physical Exam: Filed Vitals:   07/09/10 0952  BP: 100/60  Pulse: 78  Height: 5\' 5"  (1.651 m)  Weight: 225 lb (102.059 kg)    GEN- The patient is well appearing, alert and oriented x 3 today.   Head- normocephalic, atraumatic Eyes-  Sclera clear, conjunctiva pink Ears- hearing intact Oropharynx- clear Neck- supple, no JVP Lymph- no cervical lymphadenopathy Lungs- Clear to ausculation bilaterally, normal work of breathing Heart- irregular rate and rhythm, no murmurs, rubs or gallops, PMI not laterally displaced GI- soft, NT, ND, + BS Extremities- no clubbing, cyanosis, + trace edema MS- no significant deformity or atrophy Skin- no rash or lesion Psych- euthymic mood, full affect Neuro-  strength and sensation are intact  EKG today reveals afib V rate 78 bpm, Qtc 506, otherwise normal ekg

## 2010-07-09 NOTE — Patient Instructions (Signed)
Your physician recommends that you schedule a follow-up appointment in: 4 weeks with Dr. Johney Frame Your physician has recommended that you have a Cardioversion (DCCV). Electrical Cardioversion uses a jolt of electricity to your heart either through paddles or wired patches attached to your chest. This is a controlled, usually prescheduled, procedure. Defibrillation is done under light anesthesia in the hospital, and you usually go home the day of the procedure. This is done to get your heart back into a normal rhythm. You are not awake for the procedure. Please see the instruction sheet given to you today. Scheduled for noon on July 10, 2010 with Dr. Jens Som

## 2010-07-10 ENCOUNTER — Ambulatory Visit (HOSPITAL_COMMUNITY)
Admission: RE | Admit: 2010-07-10 | Discharge: 2010-07-10 | Disposition: A | Discharge status: Home / Self Care | Payer: 59 | Source: Ambulatory Visit | Attending: Cardiology | Admitting: Cardiology

## 2010-07-10 DIAGNOSIS — I4892 Unspecified atrial flutter: Secondary | ICD-10-CM

## 2010-07-11 NOTE — Assessment & Plan Note (Addendum)
  [facility]  NAMEJAMMI, [name]              ACCOUNT NO.:  [id number]  MEDICAL RECORD NO.:  [id number]      DATE OF BIRTH:  1956/01/26  PHYSICIAN:  Madolyn Frieze. Jens Som, MD, [facility] VISIT DATE:  07/10/2010                                  OFFICE VISIT   This is cardioversion of atrial flutter.  The patient was sedated with Diprivan 100 mg intravenously by Anesthesia.  Synchronized cardioversion with 120 joules resulted in sinus rhythm.  There were no immediate complications.  We would recommend continuing Coumadin and following up with Dr. Johney Frame as scheduled.     Madolyn Frieze Jens Som, MD, [facility]     BSC/MEDQ  D:  07/10/2010  T:  07/11/2010  Job:  [id number]  Electronically Signed by Olga Millers MD [facility] on 07/23/2010 07:40:14 PM

## 2010-08-04 ENCOUNTER — Ambulatory Visit (INDEPENDENT_AMBULATORY_CARE_PROVIDER_SITE_OTHER): Payer: 59 | Admitting: *Deleted

## 2010-08-04 ENCOUNTER — Encounter: Payer: Self-pay | Admitting: Internal Medicine

## 2010-08-04 ENCOUNTER — Ambulatory Visit (INDEPENDENT_AMBULATORY_CARE_PROVIDER_SITE_OTHER): Payer: 59 | Admitting: Internal Medicine

## 2010-08-04 VITALS — BP 122/80 | HR 62 | Ht 65.0 in | Wt 224.0 lb

## 2010-08-04 DIAGNOSIS — I4891 Unspecified atrial fibrillation: Secondary | ICD-10-CM

## 2010-08-04 LAB — POCT INR: INR: 2

## 2010-08-04 NOTE — Progress Notes (Signed)
The patient presents today for electrophysiology followup.  Since last being seen in our clinic, she underwent cardioversion.  She feels that her afib has been controlled since that time though she does have occasional palpitations.  Her exercise tolerance has improved.   Her dizziness has resolved and she is off steroids.  Today, she denies symptoms of chest pain, orthopnea, PND,  presyncope, syncope, or neurologic sequela.  The patient feels that she is tolerating medications without difficulties and is otherwise without complaint today.   Past Medical History  Diagnosis Date  . Allergic rhinitis   . Carpal tunnel syndrome   . TMJ syndrome   . Hypercholesteremia   . GERD (gastroesophageal reflux disease)   . Asthma   . PAF (paroxysmal atrial fibrillation)     s/p afib ablation at [facility] 2010  . Rheumatoid arthritis   . OSA on CPAP     non compliant  . Obesity    Past Surgical History  Procedure Date  . Cesarean section   . Carpal tunnel release   . Inguinal hernia repair   . Cholecystectomy   . Tmj arthroplasty   . Afib ablation 2010    at [facility]    Current Outpatient Prescriptions  Medication Sig Dispense Refill  . ASMANEX 60 METERED DOSES 220 MCG/INH inhaler 1 spray as directed.      . beclomethasone (BECONASE-AQ) 42 MCG/SPRAY nasal spray 2 sprays by Nasal route 2 (two) times daily. Dose is for each nostril.       . calcium-vitamin D 250-100 MG-UNIT per tablet Take 1 tablet by mouth 2 (two) times daily.        . cetirizine (ZYRTEC) 10 MG tablet Take 10 mg by mouth daily.        . Cholecalciferol (VITAMIN D3) 1000 UNITS CAPS Take by mouth daily.        . flecainide (TAMBOCOR) 100 MG tablet Take 1 tablet by mouth Twice daily.      Marland Kitchen losartan (COZAAR) 50 MG tablet Take 1 tablet by mouth daily.      . Multiple Vitamin (MULTIVITAMIN) tablet Take 1 tablet by mouth daily.        Marland Kitchen omeprazole (PRILOSEC) 40 MG capsule Take 1 tablet by mouth daily.      . sertraline  (ZOLOFT) 50 MG tablet Take 1 tablet by mouth daily.      . verapamil (CALAN) 80 MG tablet Take 2 tablets by mouth 3 (three) times daily.       Marland Kitchen warfarin (COUMADIN) 2 MG tablet Take by mouth as directed.          Allergies  Allergen Reactions  . Latex   . Meperidine Hcl   . Penicillins   . Sulfonamide Derivatives     History   Social History  . Marital Status: Married    Spouse Name: N/A    Number of Children: N/A  . Years of Education: N/A   Occupational History  . Not on file.   Social History Main Topics  . Smoking status: Never Smoker   . Smokeless tobacco: Not on file  . Alcohol Use: Yes     wine rarely  . Drug Use: No  . Sexually Active: Not on file   Other Topics Concern  . Not on file   Social History Narrative  . No narrative on file    Family History  Problem Relation Age of Onset  . Lupus    . Stroke    .  Heart attack      ROS-  All systems are reviewed and are negative except as outlined in the HPI above  Physical Exam: Filed Vitals:   08/04/10 1217  BP: 122/80  Pulse: 62  Height: 5\' 5"  (1.651 m)  Weight: 224 lb (101.606 kg)    GEN- The patient is well appearing, alert and oriented x 3 today.   Head- normocephalic, atraumatic Eyes-  Sclera clear, conjunctiva pink Ears- hearing intact Oropharynx- clear Neck- supple, no JVP Lymph- no cervical lymphadenopathy Lungs- Clear to ausculation bilaterally, normal work of breathing Heart- regular rate and rhythm, no murmurs, rubs or gallops, PMI not laterally displaced GI- soft, NT, ND, + BS Extremities- no clubbing, cyanosis, + trace edema MS- no significant deformity or atrophy Skin- no rash or lesion Psych- euthymic mood, full affect Neuro- strength and sensation are intact  EKG today reveals  Sinus rhythm 59 bpm, PR 204, QRS 106, Qtc 453, otherwise normal ekg

## 2010-08-04 NOTE — Assessment & Plan Note (Signed)
She feels better now back in sinus No medication changes today. If her afib worsens, then we will consider a different AAD vs repeat catheter ablation.  Continue coumadin for stroke prevention.

## 2010-08-04 NOTE — Patient Instructions (Signed)
Your physician wants you to follow-up in: 6 months with Dr. Allred. You will receive a reminder letter in the mail two months in advance. If you don't receive a letter, please call our office to schedule the follow-up appointment.  

## 2010-08-06 ENCOUNTER — Encounter: Payer: 59 | Admitting: *Deleted

## 2010-08-11 ENCOUNTER — Ambulatory Visit (INDEPENDENT_AMBULATORY_CARE_PROVIDER_SITE_OTHER): Payer: 59

## 2010-08-11 ENCOUNTER — Inpatient Hospital Stay (INDEPENDENT_AMBULATORY_CARE_PROVIDER_SITE_OTHER)
Admission: RE | Admit: 2010-08-11 | Discharge: 2010-08-11 | Disposition: A | Discharge status: Home / Self Care | Payer: 59 | Source: Ambulatory Visit | Attending: Emergency Medicine | Admitting: Emergency Medicine

## 2010-08-11 ENCOUNTER — Encounter: Payer: Self-pay | Admitting: Internal Medicine

## 2010-08-11 DIAGNOSIS — S92309A Fracture of unspecified metatarsal bone(s), unspecified foot, initial encounter for closed fracture: Secondary | ICD-10-CM

## 2010-08-19 ENCOUNTER — Other Ambulatory Visit: Payer: Self-pay | Admitting: Internal Medicine

## 2010-09-01 ENCOUNTER — Ambulatory Visit (INDEPENDENT_AMBULATORY_CARE_PROVIDER_SITE_OTHER): Payer: 59 | Admitting: *Deleted

## 2010-09-01 DIAGNOSIS — I4891 Unspecified atrial fibrillation: Secondary | ICD-10-CM

## 2010-09-29 ENCOUNTER — Ambulatory Visit (INDEPENDENT_AMBULATORY_CARE_PROVIDER_SITE_OTHER): Payer: 59 | Admitting: *Deleted

## 2010-09-29 DIAGNOSIS — I4891 Unspecified atrial fibrillation: Secondary | ICD-10-CM

## 2010-09-29 LAB — POCT INR: INR: 2.4

## 2010-10-08 ENCOUNTER — Other Ambulatory Visit: Payer: Self-pay | Admitting: Internal Medicine

## 2010-10-08 NOTE — Telephone Encounter (Signed)
Refill request

## 2010-10-20 ENCOUNTER — Other Ambulatory Visit: Payer: Self-pay | Admitting: Internal Medicine

## 2010-10-27 ENCOUNTER — Encounter: Payer: 59 | Admitting: *Deleted

## 2010-10-31 ENCOUNTER — Ambulatory Visit (INDEPENDENT_AMBULATORY_CARE_PROVIDER_SITE_OTHER): Payer: 59 | Admitting: *Deleted

## 2010-10-31 DIAGNOSIS — I4891 Unspecified atrial fibrillation: Secondary | ICD-10-CM

## 2010-10-31 LAB — POCT INR: INR: 2.8

## 2010-11-21 ENCOUNTER — Other Ambulatory Visit: Payer: Self-pay | Admitting: Internal Medicine

## 2010-11-25 ENCOUNTER — Other Ambulatory Visit: Payer: Self-pay | Admitting: Internal Medicine

## 2010-11-26 NOTE — Telephone Encounter (Signed)
PT CALLING ,TRIED TO GET REFILL SINCE LAST WEEK, SHE IS OUT NOW NEEDS TODAY

## 2010-11-28 ENCOUNTER — Encounter: Payer: 59 | Admitting: *Deleted

## 2011-01-05 ENCOUNTER — Ambulatory Visit (INDEPENDENT_AMBULATORY_CARE_PROVIDER_SITE_OTHER): Payer: 59 | Admitting: *Deleted

## 2011-01-05 DIAGNOSIS — I4891 Unspecified atrial fibrillation: Secondary | ICD-10-CM

## 2011-01-05 LAB — POCT INR: INR: 2.5

## 2011-01-05 MED ORDER — WARFARIN SODIUM 2 MG PO TABS: ORAL_TABLET | ORAL | Status: DC

## 2011-02-03 ENCOUNTER — Telehealth: Payer: Self-pay | Admitting: Internal Medicine

## 2011-02-03 MED ORDER — WARFARIN SODIUM 2 MG PO TABS: ORAL_TABLET | ORAL | Status: DC

## 2011-02-03 NOTE — Telephone Encounter (Signed)
Pt needs refill of coumdain 2 mg, uses [facility] [place], pt only has one day left, needs asap

## 2011-02-05 ENCOUNTER — Ambulatory Visit (INDEPENDENT_AMBULATORY_CARE_PROVIDER_SITE_OTHER): Payer: 59 | Admitting: *Deleted

## 2011-02-05 DIAGNOSIS — I4891 Unspecified atrial fibrillation: Secondary | ICD-10-CM

## 2011-02-05 MED ORDER — WARFARIN SODIUM 2 MG PO TABS: ORAL_TABLET | ORAL | Status: DC

## 2011-03-19 ENCOUNTER — Encounter: Payer: 59 | Admitting: *Deleted

## 2011-04-07 ENCOUNTER — Ambulatory Visit (INDEPENDENT_AMBULATORY_CARE_PROVIDER_SITE_OTHER): Payer: 59 | Admitting: *Deleted

## 2011-04-07 DIAGNOSIS — I4891 Unspecified atrial fibrillation: Secondary | ICD-10-CM

## 2011-04-07 MED ORDER — WARFARIN SODIUM 2 MG PO TABS: ORAL_TABLET | ORAL | Status: DC

## 2011-05-19 ENCOUNTER — Other Ambulatory Visit: Payer: Self-pay | Admitting: *Deleted

## 2011-05-19 ENCOUNTER — Ambulatory Visit (INDEPENDENT_AMBULATORY_CARE_PROVIDER_SITE_OTHER): Payer: 59

## 2011-05-19 DIAGNOSIS — I4891 Unspecified atrial fibrillation: Secondary | ICD-10-CM

## 2011-05-19 LAB — POCT INR: INR: 2.6

## 2011-05-19 MED ORDER — FLECAINIDE ACETATE 100 MG PO TABS: 100.0000 mg | ORAL_TABLET | Freq: Two times a day (BID) | ORAL | Status: DC

## 2011-05-21 ENCOUNTER — Other Ambulatory Visit: Payer: Self-pay | Admitting: Internal Medicine

## 2011-05-21 MED ORDER — FLECAINIDE ACETATE 100 MG PO TABS: 100.0000 mg | ORAL_TABLET | Freq: Two times a day (BID) | ORAL | Status: DC

## 2011-05-28 ENCOUNTER — Other Ambulatory Visit: Payer: Self-pay | Admitting: *Deleted

## 2011-05-28 MED ORDER — WARFARIN SODIUM 2 MG PO TABS: ORAL_TABLET | ORAL | Status: DC

## 2011-05-29 ENCOUNTER — Other Ambulatory Visit: Payer: Self-pay | Admitting: *Deleted

## 2011-05-29 ENCOUNTER — Telehealth: Payer: Self-pay

## 2011-05-29 MED ORDER — WARFARIN SODIUM 2 MG PO TABS: ORAL_TABLET | ORAL | Status: DC

## 2011-07-15 ENCOUNTER — Ambulatory Visit (INDEPENDENT_AMBULATORY_CARE_PROVIDER_SITE_OTHER): Payer: 59 | Admitting: *Deleted

## 2011-07-15 DIAGNOSIS — I4891 Unspecified atrial fibrillation: Secondary | ICD-10-CM

## 2011-08-24 ENCOUNTER — Other Ambulatory Visit: Payer: Self-pay | Admitting: *Deleted

## 2011-08-24 MED ORDER — WARFARIN SODIUM 2 MG PO TABS: ORAL_TABLET | ORAL | Status: DC

## 2011-08-26 ENCOUNTER — Ambulatory Visit (INDEPENDENT_AMBULATORY_CARE_PROVIDER_SITE_OTHER): Payer: 59 | Admitting: *Deleted

## 2011-08-26 DIAGNOSIS — I4891 Unspecified atrial fibrillation: Secondary | ICD-10-CM

## 2011-10-02 ENCOUNTER — Other Ambulatory Visit (HOSPITAL_COMMUNITY): Payer: Self-pay | Admitting: Sports Medicine

## 2011-10-02 DIAGNOSIS — M545 Low back pain, unspecified: Secondary | ICD-10-CM

## 2011-10-02 DIAGNOSIS — M5430 Sciatica, unspecified side: Secondary | ICD-10-CM

## 2011-10-06 ENCOUNTER — Ambulatory Visit (HOSPITAL_COMMUNITY)
Admission: RE | Admit: 2011-10-06 | Discharge: 2011-10-06 | Disposition: A | Discharge status: Home / Self Care | Payer: 59 | Source: Ambulatory Visit | Attending: Sports Medicine | Admitting: Sports Medicine

## 2011-10-06 DIAGNOSIS — M545 Low back pain, unspecified: Secondary | ICD-10-CM | POA: Insufficient documentation

## 2011-10-06 DIAGNOSIS — M47817 Spondylosis without myelopathy or radiculopathy, lumbosacral region: Secondary | ICD-10-CM | POA: Insufficient documentation

## 2011-10-06 DIAGNOSIS — M5430 Sciatica, unspecified side: Secondary | ICD-10-CM | POA: Insufficient documentation

## 2011-10-07 ENCOUNTER — Ambulatory Visit (INDEPENDENT_AMBULATORY_CARE_PROVIDER_SITE_OTHER): Payer: 59 | Admitting: *Deleted

## 2011-10-07 DIAGNOSIS — I4891 Unspecified atrial fibrillation: Secondary | ICD-10-CM

## 2011-10-07 LAB — POCT INR: INR: 2.9

## 2011-11-18 ENCOUNTER — Ambulatory Visit (INDEPENDENT_AMBULATORY_CARE_PROVIDER_SITE_OTHER): Payer: 59 | Admitting: *Deleted

## 2011-11-18 DIAGNOSIS — I4891 Unspecified atrial fibrillation: Secondary | ICD-10-CM

## 2011-12-30 ENCOUNTER — Ambulatory Visit (INDEPENDENT_AMBULATORY_CARE_PROVIDER_SITE_OTHER): Payer: 59 | Admitting: *Deleted

## 2011-12-30 DIAGNOSIS — I4891 Unspecified atrial fibrillation: Secondary | ICD-10-CM

## 2011-12-30 LAB — POCT INR: INR: 2.8

## 2012-02-10 ENCOUNTER — Ambulatory Visit (INDEPENDENT_AMBULATORY_CARE_PROVIDER_SITE_OTHER): Payer: 59 | Admitting: *Deleted

## 2012-02-10 DIAGNOSIS — I4891 Unspecified atrial fibrillation: Secondary | ICD-10-CM

## 2012-02-23 ENCOUNTER — Other Ambulatory Visit: Payer: Self-pay | Admitting: Sports Medicine

## 2012-02-23 DIAGNOSIS — M25552 Pain in left hip: Secondary | ICD-10-CM

## 2012-02-23 DIAGNOSIS — M25559 Pain in unspecified hip: Secondary | ICD-10-CM

## 2012-02-27 ENCOUNTER — Ambulatory Visit
Admission: RE | Admit: 2012-02-27 | Discharge: 2012-02-27 | Disposition: A | Discharge status: Home / Self Care | Payer: 59 | Source: Ambulatory Visit | Attending: Sports Medicine | Admitting: Sports Medicine

## 2012-02-27 DIAGNOSIS — M25559 Pain in unspecified hip: Secondary | ICD-10-CM

## 2012-02-27 DIAGNOSIS — M25552 Pain in left hip: Secondary | ICD-10-CM

## 2012-03-04 ENCOUNTER — Other Ambulatory Visit: Payer: Self-pay | Admitting: *Deleted

## 2012-03-04 MED ORDER — WARFARIN SODIUM 2 MG PO TABS: ORAL_TABLET | ORAL | Status: DC

## 2012-04-14 ENCOUNTER — Ambulatory Visit (INDEPENDENT_AMBULATORY_CARE_PROVIDER_SITE_OTHER): Payer: 59

## 2012-04-14 DIAGNOSIS — I4891 Unspecified atrial fibrillation: Secondary | ICD-10-CM

## 2012-04-18 ENCOUNTER — Ambulatory Visit (INDEPENDENT_AMBULATORY_CARE_PROVIDER_SITE_OTHER): Payer: 59 | Admitting: *Deleted

## 2012-04-18 DIAGNOSIS — I4891 Unspecified atrial fibrillation: Secondary | ICD-10-CM

## 2012-05-12 ENCOUNTER — Ambulatory Visit (INDEPENDENT_AMBULATORY_CARE_PROVIDER_SITE_OTHER): Payer: 59

## 2012-05-12 DIAGNOSIS — I4891 Unspecified atrial fibrillation: Secondary | ICD-10-CM

## 2012-06-02 ENCOUNTER — Other Ambulatory Visit: Payer: Self-pay | Admitting: *Deleted

## 2012-06-02 MED ORDER — FLECAINIDE ACETATE 100 MG PO TABS: 100.0000 mg | ORAL_TABLET | Freq: Two times a day (BID) | ORAL | Status: DC

## 2012-06-02 NOTE — Telephone Encounter (Signed)
Ok to fill 1 time per Dennis Bast which is Dr. Bonney Aid nurse. But pt has to make appt to obtain more refills

## 2012-06-03 ENCOUNTER — Other Ambulatory Visit: Payer: Self-pay | Admitting: Emergency Medicine

## 2012-06-03 ENCOUNTER — Other Ambulatory Visit: Payer: Self-pay

## 2012-06-03 MED ORDER — FLECAINIDE ACETATE 100 MG PO TABS: 100.0000 mg | ORAL_TABLET | Freq: Two times a day (BID) | ORAL | Status: DC

## 2012-06-15 ENCOUNTER — Encounter: Payer: Self-pay | Admitting: Internal Medicine

## 2012-06-15 ENCOUNTER — Ambulatory Visit (INDEPENDENT_AMBULATORY_CARE_PROVIDER_SITE_OTHER): Payer: 59 | Admitting: Internal Medicine

## 2012-06-15 VITALS — BP 138/82 | HR 84 | Ht 65.0 in | Wt 237.0 lb

## 2012-06-15 DIAGNOSIS — R0602 Shortness of breath: Secondary | ICD-10-CM

## 2012-06-15 DIAGNOSIS — I4891 Unspecified atrial fibrillation: Secondary | ICD-10-CM

## 2012-06-15 DIAGNOSIS — R079 Chest pain, unspecified: Secondary | ICD-10-CM

## 2012-06-15 NOTE — Assessment & Plan Note (Signed)
Exertional shortness of breath could be an anginal equivalent Will proceed with stress myoview

## 2012-06-15 NOTE — Assessment & Plan Note (Signed)
Doing well. No changes at this time

## 2012-06-15 NOTE — Assessment & Plan Note (Signed)
Worrisome for angina Will proceed with stress myoview

## 2012-06-15 NOTE — Patient Instructions (Signed)
Your physician wants you to follow-up in: 12 months with Dr Allred You will receive a reminder letter in the mail two months in advance. If you don't receive a letter, please call our office to schedule the follow-up appointment.  Your physician has requested that you have en exercise stress myoview. For further information please visit [email or web address]. Please follow instruction sheet, as given.       

## 2012-06-15 NOTE — Progress Notes (Signed)
The patient presents today for electrophysiology followup.  Since last being seen in our clinic, she has done reasonably well.  She feels that her afib has been controlled.  Her exercise tolerance is an issue.   She reports CP and SOB with moderate activity.  This is worese since her last visit.  Today, she denies symptoms of  orthopnea, PND,  presyncope, syncope, or neurologic sequela.  The patient feels that she is tolerating medications without difficulties and is otherwise without complaint today.   Past Medical History  Diagnosis Date  . Allergic rhinitis   . Carpal tunnel syndrome   . TMJ syndrome   . Hypercholesteremia   . GERD (gastroesophageal reflux disease)   . Asthma   . PAF (paroxysmal atrial fibrillation)     s/p afib ablation at [facility] 2010  . Rheumatoid arthritis   . OSA on CPAP     non compliant  . Obesity    Past Surgical History  Procedure Laterality Date  . Cesarean section    . Carpal tunnel release    . Inguinal hernia repair    . Cholecystectomy    . Tmj arthroplasty    . Afib ablation  2010    at [facility]    Current Outpatient Prescriptions  Medication Sig Dispense Refill  . ASMANEX 60 METERED DOSES 220 MCG/INH inhaler 1 spray as directed.      . calcium-vitamin D 250-100 MG-UNIT per tablet Take 1 tablet by mouth 2 (two) times daily.        . cetirizine (ZYRTEC) 10 MG tablet Take 10 mg by mouth daily.        . Cholecalciferol (VITAMIN D3) 1000 UNITS CAPS Take by mouth daily.        . flecainide (TAMBOCOR) 100 MG tablet Take 1 tablet (100 mg total) by mouth 2 (two) times daily.  60 tablet  5  . fluticasone (FLONASE) 50 MCG/ACT nasal spray       . losartan (COZAAR) 50 MG tablet TAKE 1 TABLET EVERY DAY  90 tablet  3  . Multiple Vitamin (MULTIVITAMIN) tablet Take 1 tablet by mouth daily.        Marland Kitchen omeprazole (PRILOSEC) 40 MG capsule Take 1 tablet by mouth daily.      Marland Kitchen PROAIR HFA 108 (90 BASE) MCG/ACT inhaler       . sertraline (ZOLOFT) 50 MG  tablet Take 1 tablet by mouth daily.      . verapamil (CALAN) 80 MG tablet Take 2 tablets by mouth 3 (three) times daily.       Marland Kitchen warfarin (COUMADIN) 2 MG tablet Take as directed by Anticoagulation clinic  210 tablet  1   No current facility-administered medications for this visit.    Allergies  Allergen Reactions  . Latex   . Meperidine Hcl   . Penicillins   . Sulfonamide Derivatives     History   Social History  . Marital Status: Married    Spouse Name: N/A    Number of Children: N/A  . Years of Education: N/A   Occupational History  . Not on file.   Social History Main Topics  . Smoking status: Never Smoker   . Smokeless tobacco: Not on file  . Alcohol Use: Yes     Comment: wine rarely  . Drug Use: No  . Sexually Active: Not on file   Other Topics Concern  . Not on file   Social History Narrative  . No narrative  on file    Family History  Problem Relation Age of Onset  . Lupus    . Stroke    . Heart attack      ROS-  All systems are reviewed and are negative except as outlined in the HPI above  Physical Exam: Filed Vitals:   06/15/12 1513  BP: 138/82  Pulse: 84  Height: 5\' 5"  (1.651 m)  Weight: 237 lb (107.502 kg)  SpO2: 98%    GEN- The patient is well appearing, alert and oriented x 3 today.   Head- normocephalic, atraumatic Eyes-  Sclera clear, conjunctiva pink Ears- hearing intact Oropharynx- clear Neck- supple, no JVP Lymph- no cervical lymphadenopathy Lungs- Clear to ausculation bilaterally, normal work of breathing Heart- regular rate and rhythm, no murmurs, rubs or gallops, PMI not laterally displaced GI- soft, NT, ND, + BS Extremities- no clubbing, cyanosis, + trace edema MS- no significant deformity or atrophy Skin- no rash or lesion Psych- euthymic mood, full affect Neuro- strength and sensation are intact  EKG today reveals  Sinus rhythm 76 bpm,  otherwise normal ekg

## 2012-06-21 DIAGNOSIS — IMO0001 Reserved for inherently not codable concepts without codable children: Secondary | ICD-10-CM

## 2012-06-21 HISTORY — DX: Reserved for inherently not codable concepts without codable children: IMO0001

## 2012-06-22 ENCOUNTER — Ambulatory Visit (HOSPITAL_COMMUNITY): Payer: 59 | Attending: Internal Medicine | Admitting: Radiology

## 2012-06-22 ENCOUNTER — Ambulatory Visit (INDEPENDENT_AMBULATORY_CARE_PROVIDER_SITE_OTHER): Payer: 59 | Admitting: Pharmacist

## 2012-06-22 VITALS — Ht 65.0 in | Wt 228.0 lb

## 2012-06-22 DIAGNOSIS — R0602 Shortness of breath: Secondary | ICD-10-CM | POA: Insufficient documentation

## 2012-06-22 DIAGNOSIS — R002 Palpitations: Secondary | ICD-10-CM | POA: Insufficient documentation

## 2012-06-22 DIAGNOSIS — R0609 Other forms of dyspnea: Secondary | ICD-10-CM | POA: Insufficient documentation

## 2012-06-22 DIAGNOSIS — R000 Tachycardia, unspecified: Secondary | ICD-10-CM | POA: Insufficient documentation

## 2012-06-22 DIAGNOSIS — I4891 Unspecified atrial fibrillation: Secondary | ICD-10-CM

## 2012-06-22 DIAGNOSIS — R42 Dizziness and giddiness: Secondary | ICD-10-CM | POA: Insufficient documentation

## 2012-06-22 DIAGNOSIS — R5383 Other fatigue: Secondary | ICD-10-CM | POA: Insufficient documentation

## 2012-06-22 DIAGNOSIS — R079 Chest pain, unspecified: Secondary | ICD-10-CM | POA: Insufficient documentation

## 2012-06-22 DIAGNOSIS — R0989 Other specified symptoms and signs involving the circulatory and respiratory systems: Secondary | ICD-10-CM | POA: Insufficient documentation

## 2012-06-22 DIAGNOSIS — R5381 Other malaise: Secondary | ICD-10-CM | POA: Insufficient documentation

## 2012-06-22 MED ORDER — TECHNETIUM TC 99M SESTAMIBI GENERIC - CARDIOLITE
30.0000 | Freq: Once | INTRAVENOUS | Status: AC | PRN
  Administered 2012-06-22: 30 via INTRAVENOUS

## 2012-06-22 NOTE — Progress Notes (Signed)
[facility] SITE 3 NUCLEAR MED [address] [place], [place] [address] [phone]    Cardiology Nuclear Med Study  [name] is a 57 y.o. female     MRN : [id number]     DOB: 1956/01/24  Procedure Date: 06/22/2012  Nuclear Med Background Indication for Stress Test:  Evaluation for Ischemia History:  6 years ago Echo: pleural effusion per patient (done in [place]), 6 years ago Cath: normal per patient ([place]), '10 Ablation (AFIB), 05-2010 Myocardial Perfusion Study-Normal, EF=82%, 06-2011 Cardioversion (PAF), MVP Cardiac Risk Factors: Family History - CAD and Lipids  Symptoms: Chest Pain/Tightness with/without exertion (last occurrence 10 days ago),  Diaphoresis, Dizziness, DOE, Fatigue, Fatigue with Exertion, Light-Headedness, Nausea, Near Syncope, Palpitations, Rapid HR and SOB   Nuclear Pre-Procedure Caffeine/Decaff Intake:  None NPO After: 7:00pm   Lungs:  Clear, Proair inhaler 2 sprays prior to exercise, O2 Sat: 97% on room air. IV 0.9% NS with Angio Cath:  22g  IV Site: R Hand  IV Started by:  Cathlyn Parsons, RN  Chest Size (in):  40 Cup Size: D  Height: 5\' 5"  (1.651 m)  Weight:  228 lb (103.42 kg)  BMI:  Body mass index is 37.94 kg/(m^2). Tech Comments:  No Verapamil this am; took Flecainide this morning    Nuclear Med Study 1 or 2 day study: 2 day  Stress Test Type:  Stress  Reading MD: Charlton Haws, MD  Order Authorizing Provider:  Jarold Song  Resting Radionuclide: Technetium 30m Sestamibi  Resting Radionuclide Dose: 33.0 mCi  On       06-27-12  Stress Radionuclide:  Technetium 40m Sestamibi  Stress Radionuclide Dose: 33.0 mCi   On        06-22-12          Stress Protocol Rest HR: 71 Stress HR: 142  Rest BP: 153/85 Stress BP: 204/82  Exercise Time (min): 4:00 METS: 5.6   Predicted Max HR: 162 bpm % Max HR: 87.65 bpm Rate Pressure Product: 21308   Dose of Adenosine (mg):  n/a Dose of Lexiscan: n/a mg  Dose of Atropine (mg): n/a  Dose of Dobutamine: n/a mcg/kg/min (at max HR)  Stress Test Technologist: Irean Hong, RN  Nuclear Technologist:  Domenic Polite, CNMT     Rest Procedure:  Myocardial perfusion imaging was performed at rest 45 minutes following the intravenous administration of Technetium 43m Sestamibi. Rest ECG: NSR - Normal EKG  Stress Procedure:  The patient exercised on the treadmill utilizing the Bruce Protocol 4 minutes, RPE=17. The patient stopped due to DOE, Fatigue and denied any chest pain. There was a mild hypertensive response to exercise. Technetium 23m Sestamibi was injected at peak exercise and myocardial perfusion imaging was performed after a brief delay. Stress ECG: No significant change from baseline ECG  QPS Raw Data Images:  Normal; no motion artifact; normal heart/lung ratio. Stress Images:  Normal homogeneous uptake in all areas of the myocardium. Rest Images:  Normal homogeneous uptake in all areas of the myocardium. Subtraction (SDS):  Normal Transient Ischemic Dilatation (Normal <1.22):  1.04 Lung/Heart Ratio (Normal <0.45):  0.38  Quantitative Gated Spect Images QGS EDV:  66 ml QGS ESV:  13 ml  Impression Exercise Capacity:  Poor exercise capacity. BP Response:  Normal blood pressure response. Clinical Symptoms:  No significant symptoms noted. ECG Impression:  No significant ST segment change suggestive of ischemia. Comparison with Prior Nuclear Study: No images to compare  Overall Impression:  Normal stress nuclear  study.  LV Ejection Fraction: 81%.  LV Wall Motion:  NL LV Function; NL Wall Motion   Charlton Haws

## 2012-06-27 ENCOUNTER — Ambulatory Visit (HOSPITAL_COMMUNITY): Payer: 59 | Attending: Cardiovascular Disease | Admitting: Radiology

## 2012-06-27 DIAGNOSIS — R0989 Other specified symptoms and signs involving the circulatory and respiratory systems: Secondary | ICD-10-CM

## 2012-06-27 DIAGNOSIS — R079 Chest pain, unspecified: Secondary | ICD-10-CM

## 2012-06-27 MED ORDER — TECHNETIUM TC 99M SESTAMIBI GENERIC - CARDIOLITE
33.0000 | Freq: Once | INTRAVENOUS | Status: AC | PRN
  Administered 2012-06-27: 33 via INTRAVENOUS

## 2012-07-04 ENCOUNTER — Other Ambulatory Visit: Payer: Self-pay

## 2012-07-04 MED ORDER — WARFARIN SODIUM 2 MG PO TABS: ORAL_TABLET | ORAL | Status: DC

## 2012-08-03 ENCOUNTER — Ambulatory Visit (INDEPENDENT_AMBULATORY_CARE_PROVIDER_SITE_OTHER): Payer: 59 | Admitting: *Deleted

## 2012-08-03 DIAGNOSIS — I4891 Unspecified atrial fibrillation: Secondary | ICD-10-CM

## 2012-08-10 ENCOUNTER — Other Ambulatory Visit: Payer: Self-pay

## 2012-08-10 NOTE — Telephone Encounter (Signed)
Flecainide was sent into pharmacy ([facility]) 06/03/12 with refills remaining. Called [facility] and they stated the pt has refills remaining on her Flecainide and they will fill it and let pt know.

## 2012-09-14 ENCOUNTER — Ambulatory Visit (INDEPENDENT_AMBULATORY_CARE_PROVIDER_SITE_OTHER): Payer: 59 | Admitting: *Deleted

## 2012-09-14 DIAGNOSIS — I4891 Unspecified atrial fibrillation: Secondary | ICD-10-CM

## 2012-09-14 LAB — POCT INR: INR: 2.3

## 2012-10-18 ENCOUNTER — Other Ambulatory Visit: Payer: Self-pay | Admitting: Obstetrics and Gynecology

## 2012-10-18 DIAGNOSIS — R928 Other abnormal and inconclusive findings on diagnostic imaging of breast: Secondary | ICD-10-CM

## 2012-10-26 ENCOUNTER — Encounter: Payer: 59 | Admitting: Pharmacist

## 2012-10-26 NOTE — Progress Notes (Signed)
  This encounter was created in error - please disregard This encounter was created in error - please disregard. This encounter was created in error - please disregard. This encounter was created in error - please disregard. This encounter was created in error - please disregard. 

## 2012-11-03 ENCOUNTER — Ambulatory Visit
Admission: RE | Admit: 2012-11-03 | Discharge: 2012-11-03 | Disposition: A | Discharge status: Home / Self Care | Payer: 59 | Source: Ambulatory Visit | Attending: Obstetrics and Gynecology | Admitting: Obstetrics and Gynecology

## 2012-11-03 DIAGNOSIS — R928 Other abnormal and inconclusive findings on diagnostic imaging of breast: Secondary | ICD-10-CM

## 2012-11-28 ENCOUNTER — Encounter: Payer: Self-pay | Admitting: Internal Medicine

## 2012-12-05 ENCOUNTER — Other Ambulatory Visit: Payer: Self-pay | Admitting: Internal Medicine

## 2012-12-12 ENCOUNTER — Ambulatory Visit (INDEPENDENT_AMBULATORY_CARE_PROVIDER_SITE_OTHER): Payer: 59 | Admitting: Pharmacist

## 2012-12-12 DIAGNOSIS — I4891 Unspecified atrial fibrillation: Secondary | ICD-10-CM

## 2012-12-12 LAB — POCT INR: INR: 2.1

## 2013-01-12 ENCOUNTER — Observation Stay (HOSPITAL_COMMUNITY): Payer: 59

## 2013-01-12 ENCOUNTER — Emergency Department (HOSPITAL_COMMUNITY): Payer: 59

## 2013-01-12 ENCOUNTER — Encounter (HOSPITAL_COMMUNITY): Payer: Self-pay | Admitting: Emergency Medicine

## 2013-01-12 ENCOUNTER — Observation Stay (HOSPITAL_COMMUNITY)
Admission: EM | Admit: 2013-01-12 | Discharge: 2013-01-13 | Disposition: A | Discharge status: Home / Self Care | Payer: 59 | Attending: Cardiology | Admitting: Cardiology

## 2013-01-12 DIAGNOSIS — I4891 Unspecified atrial fibrillation: Secondary | ICD-10-CM | POA: Diagnosis not present

## 2013-01-12 DIAGNOSIS — E7800 Pure hypercholesterolemia, unspecified: Secondary | ICD-10-CM | POA: Diagnosis present

## 2013-01-12 DIAGNOSIS — Z86718 Personal history of other venous thrombosis and embolism: Secondary | ICD-10-CM | POA: Diagnosis not present

## 2013-01-12 DIAGNOSIS — Z79899 Other long term (current) drug therapy: Secondary | ICD-10-CM | POA: Diagnosis not present

## 2013-01-12 DIAGNOSIS — R079 Chest pain, unspecified: Secondary | ICD-10-CM | POA: Diagnosis present

## 2013-01-12 DIAGNOSIS — Z7901 Long term (current) use of anticoagulants: Secondary | ICD-10-CM | POA: Insufficient documentation

## 2013-01-12 DIAGNOSIS — R7309 Other abnormal glucose: Secondary | ICD-10-CM | POA: Diagnosis not present

## 2013-01-12 DIAGNOSIS — G4733 Obstructive sleep apnea (adult) (pediatric): Secondary | ICD-10-CM | POA: Insufficient documentation

## 2013-01-12 DIAGNOSIS — R739 Hyperglycemia, unspecified: Secondary | ICD-10-CM

## 2013-01-12 DIAGNOSIS — J45909 Unspecified asthma, uncomplicated: Secondary | ICD-10-CM | POA: Diagnosis not present

## 2013-01-12 DIAGNOSIS — K219 Gastro-esophageal reflux disease without esophagitis: Secondary | ICD-10-CM | POA: Diagnosis present

## 2013-01-12 DIAGNOSIS — I480 Paroxysmal atrial fibrillation: Secondary | ICD-10-CM | POA: Diagnosis present

## 2013-01-12 HISTORY — DX: Other specified postprocedural states: Z98.890

## 2013-01-12 HISTORY — DX: Nausea with vomiting, unspecified: Z98.890

## 2013-01-12 HISTORY — DX: Acute embolism and thrombosis of unspecified deep veins of unspecified lower extremity: I82.409

## 2013-01-12 HISTORY — DX: Other specified cardiac arrhythmias: I49.8

## 2013-01-12 HISTORY — DX: Obstructive sleep apnea (adult) (pediatric): G47.33

## 2013-01-12 HISTORY — DX: Unspecified atrial flutter: I48.92

## 2013-01-12 HISTORY — DX: Adverse effect of inhaled anesthetics, initial encounter: T41.0X5A

## 2013-01-12 HISTORY — DX: Nausea with vomiting, unspecified: R11.2

## 2013-01-12 LAB — COMPREHENSIVE METABOLIC PANEL
ALT: 24 U/L (ref 0–35)
AST: 18 U/L (ref 0–37)
Albumin: 3.7 g/dL (ref 3.5–5.2)
Alkaline Phosphatase: 88 U/L (ref 39–117)
CO2: 25 mEq/L (ref 19–32)
Calcium: 9.7 mg/dL (ref 8.4–10.5)
Chloride: 99 mEq/L (ref 96–112)
GFR calc Af Amer: >90 mL/min (ref >90)
Glucose, Bld: 155 mg/dL — ABNORMAL HIGH (ref 70–99)
Potassium: 3.5 mEq/L (ref 3.5–5.1)
Sodium: 136 mEq/L (ref 135–145)
Total Bilirubin: 0.2 mg/dL — ABNORMAL LOW (ref 0.3–1.2)
Total Protein: 7.4 g/dL (ref 6.0–8.3)

## 2013-01-12 LAB — CBC WITH DIFFERENTIAL/PLATELET
Eosinophils Absolute: 0.1 10*3/uL (ref 0.0–0.7)
Eosinophils Relative: 1 % (ref 0–5)
Lymphocytes Relative: 39 % (ref 12–46)
Lymphs Abs: 3.3 10*3/uL (ref 0.7–4.0)
MCHC: 34.1 g/dL (ref 30.0–36.0)
MCV: 90.1 fL (ref 78.0–100.0)
Neutro Abs: 4.2 10*3/uL (ref 1.7–7.7)
Neutrophils Relative %: 51 % (ref 43–77)
Platelets: 342 10*3/uL (ref 150–400)
RBC: 4.46 MIL/uL (ref 3.87–5.11)
WBC: 8.3 10*3/uL (ref 4.0–10.5)

## 2013-01-12 LAB — PROTIME-INR
INR: 1.68 — ABNORMAL HIGH (ref 0.00–1.49)
Prothrombin Time: 19.3 seconds — ABNORMAL HIGH (ref 11.6–15.2)

## 2013-01-12 LAB — POCT I-STAT TROPONIN I: Troponin i, poc: 0 ng/mL (ref 0.00–0.08)

## 2013-01-12 LAB — LIPASE, BLOOD: Lipase: 30 U/L (ref 11–59)

## 2013-01-12 MED ORDER — MORPHINE SULFATE 4 MG/ML IJ SOLN: 4.0000 mg | Freq: Once | INTRAMUSCULAR | Status: DC

## 2013-01-12 MED ORDER — ADULT MULTIVITAMIN W/MINERALS CH
1.0000 | ORAL_TABLET | Freq: Every day | ORAL | Status: DC
  Administered 2013-01-13: 1 via ORAL
  Filled 2013-01-12: qty 1

## 2013-01-12 MED ORDER — ALBUTEROL SULFATE HFA 108 (90 BASE) MCG/ACT IN AERS: 2.0000 | INHALATION_SPRAY | Freq: Four times a day (QID) | RESPIRATORY_TRACT | Status: DC | PRN

## 2013-01-12 MED ORDER — GI COCKTAIL ~~LOC~~
30.0000 mL | Freq: Once | ORAL | Status: AC | PRN
  Filled 2013-01-12: qty 30

## 2013-01-12 MED ORDER — MORPHINE SULFATE 2 MG/ML IJ SOLN
1.0000 mg | Freq: Once | INTRAMUSCULAR | Status: AC | PRN
  Administered 2013-01-12: 1 mg via INTRAVENOUS
  Filled 2013-01-12: qty 1

## 2013-01-12 MED ORDER — CALCIUM CITRATE-VITAMIN D 250-100 MG-UNIT PO TABS: 1.0000 | ORAL_TABLET | Freq: Two times a day (BID) | ORAL | Status: DC

## 2013-01-12 MED ORDER — FLUTICASONE PROPIONATE HFA 44 MCG/ACT IN AERO
2.0000 | INHALATION_SPRAY | Freq: Two times a day (BID) | RESPIRATORY_TRACT | Status: DC
  Administered 2013-01-12 – 2013-01-13 (×2): 2 via RESPIRATORY_TRACT
  Filled 2013-01-12 (×2): qty 10.6

## 2013-01-12 MED ORDER — SODIUM CHLORIDE 0.9 % IV SOLN: 250.0000 mL | INTRAVENOUS | Status: DC | PRN

## 2013-01-12 MED ORDER — VERAPAMIL HCL 80 MG PO TABS
160.0000 mg | ORAL_TABLET | Freq: Two times a day (BID) | ORAL | Status: DC
  Administered 2013-01-12 – 2013-01-13 (×2): 160 mg via ORAL
  Filled 2013-01-12 (×3): qty 2

## 2013-01-12 MED ORDER — METHYLPREDNISOLONE SODIUM SUCC 125 MG IJ SOLR
125.0000 mg | Freq: Once | INTRAMUSCULAR | Status: AC
  Administered 2013-01-12: 125 mg via INTRAVENOUS
  Filled 2013-01-12: qty 2

## 2013-01-12 MED ORDER — MORPHINE SULFATE 4 MG/ML IJ SOLN
4.0000 mg | Freq: Once | INTRAMUSCULAR | Status: AC
  Administered 2013-01-12: 4 mg via INTRAVENOUS
  Filled 2013-01-12: qty 1

## 2013-01-12 MED ORDER — ONE-DAILY MULTI VITAMINS PO TABS: 1.0000 | ORAL_TABLET | Freq: Every day | ORAL | Status: DC

## 2013-01-12 MED ORDER — PANTOPRAZOLE SODIUM 40 MG PO TBEC
40.0000 mg | DELAYED_RELEASE_TABLET | Freq: Two times a day (BID) | ORAL | Status: DC
  Administered 2013-01-13: 40 mg via ORAL
  Filled 2013-01-12: qty 1

## 2013-01-12 MED ORDER — LOSARTAN POTASSIUM 50 MG PO TABS
50.0000 mg | ORAL_TABLET | Freq: Every day | ORAL | Status: DC
  Filled 2013-01-12: qty 1

## 2013-01-12 MED ORDER — DIPHENHYDRAMINE HCL 50 MG/ML IJ SOLN: 25.0000 mg | Freq: Once | INTRAMUSCULAR | Status: DC

## 2013-01-12 MED ORDER — HEPARIN (PORCINE) IN NACL 100-0.45 UNIT/ML-% IJ SOLN
1550.0000 [IU]/h | INTRAMUSCULAR | Status: DC
  Administered 2013-01-12: 1300 [IU]/h via INTRAVENOUS
  Administered 2013-01-13: 1700 [IU]/h via INTRAVENOUS
  Administered 2013-01-13: 1550 [IU]/h via INTRAVENOUS
  Filled 2013-01-12 (×3): qty 250

## 2013-01-12 MED ORDER — ACETAMINOPHEN 325 MG PO TABS: 650.0000 mg | ORAL_TABLET | ORAL | Status: DC | PRN

## 2013-01-12 MED ORDER — FLECAINIDE ACETATE 100 MG PO TABS
100.0000 mg | ORAL_TABLET | Freq: Two times a day (BID) | ORAL | Status: DC
  Administered 2013-01-12 – 2013-01-13 (×2): 100 mg via ORAL
  Filled 2013-01-12 (×3): qty 1

## 2013-01-12 MED ORDER — DIPHENHYDRAMINE HCL 50 MG/ML IJ SOLN
50.0000 mg | Freq: Once | INTRAMUSCULAR | Status: AC
  Administered 2013-01-12: 50 mg via INTRAVENOUS
  Filled 2013-01-12: qty 1

## 2013-01-12 MED ORDER — SODIUM CHLORIDE 0.9 % IJ SOLN: 3.0000 mL | INTRAMUSCULAR | Status: DC | PRN

## 2013-01-12 MED ORDER — NITROGLYCERIN 0.4 MG SL SUBL: 0.4000 mg | SUBLINGUAL_TABLET | SUBLINGUAL | Status: DC | PRN

## 2013-01-12 MED ORDER — MORPHINE SULFATE 2 MG/ML IJ SOLN
1.0000 mg | INTRAMUSCULAR | Status: DC | PRN
  Administered 2013-01-12 – 2013-01-13 (×2): 2 mg via INTRAVENOUS
  Filled 2013-01-12 (×2): qty 1

## 2013-01-12 MED ORDER — LORATADINE 10 MG PO TABS
10.0000 mg | ORAL_TABLET | Freq: Every day | ORAL | Status: DC
  Filled 2013-01-12: qty 1

## 2013-01-12 MED ORDER — FAMOTIDINE IN NACL 20-0.9 MG/50ML-% IV SOLN
20.0000 mg | Freq: Once | INTRAVENOUS | Status: AC
  Administered 2013-01-12: 20 mg via INTRAVENOUS
  Filled 2013-01-12: qty 50

## 2013-01-12 MED ORDER — HEPARIN BOLUS VIA INFUSION
3000.0000 [IU] | Freq: Once | INTRAVENOUS | Status: AC
  Administered 2013-01-12: 3000 [IU] via INTRAVENOUS
  Filled 2013-01-12: qty 3000

## 2013-01-12 MED ORDER — SODIUM CHLORIDE 0.9 % IJ SOLN
3.0000 mL | Freq: Two times a day (BID) | INTRAMUSCULAR | Status: DC
  Administered 2013-01-13: 3 mL via INTRAVENOUS

## 2013-01-12 MED ORDER — CALCIUM CARBONATE-VITAMIN D 500-200 MG-UNIT PO TABS
1.0000 | ORAL_TABLET | Freq: Two times a day (BID) | ORAL | Status: DC
  Administered 2013-01-13: 1 via ORAL
  Filled 2013-01-12 (×3): qty 1

## 2013-01-12 MED ORDER — VITAMIN D3 25 MCG (1000 UT) PO CAPS: 1000.0000 [IU] | ORAL_CAPSULE | Freq: Every day | ORAL | Status: DC

## 2013-01-12 MED ORDER — FLUTICASONE PROPIONATE 50 MCG/ACT NA SUSP
1.0000 | Freq: Every day | NASAL | Status: DC
  Administered 2013-01-13: 1 via NASAL
  Filled 2013-01-12: qty 16

## 2013-01-12 MED ORDER — SERTRALINE HCL 50 MG PO TABS
50.0000 mg | ORAL_TABLET | Freq: Every day | ORAL | Status: DC
  Filled 2013-01-12: qty 1

## 2013-01-12 MED ORDER — VENLAFAXINE HCL ER 37.5 MG PO CP24
37.5000 mg | ORAL_CAPSULE | Freq: Every day | ORAL | Status: DC
  Administered 2013-01-13: 37.5 mg via ORAL
  Filled 2013-01-12 (×2): qty 1

## 2013-01-12 MED ORDER — VITAMIN D3 25 MCG (1000 UNIT) PO TABS
1000.0000 [IU] | ORAL_TABLET | Freq: Every day | ORAL | Status: DC
  Administered 2013-01-13: 1000 [IU] via ORAL
  Filled 2013-01-12: qty 1

## 2013-01-12 MED ORDER — IOHEXOL 350 MG/ML SOLN
100.0000 mL | Freq: Once | INTRAVENOUS | Status: AC | PRN
  Administered 2013-01-12: 100 mL via INTRAVENOUS

## 2013-01-12 MED ORDER — ONDANSETRON HCL 4 MG/2ML IJ SOLN
4.0000 mg | Freq: Four times a day (QID) | INTRAMUSCULAR | Status: DC | PRN
  Administered 2013-01-12: 4 mg via INTRAVENOUS
  Filled 2013-01-12: qty 2

## 2013-01-12 NOTE — ED Notes (Signed)
Cardiology at bedside.

## 2013-01-12 NOTE — H&P (Signed)
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 She was in her usual state of health last night until after eating dinner when she developed diaphoresis, a pain in the center of her chest described as a "knot," nausea, and restlessness. She also felt pain in her back between her shoulder blades. She found it difficult to sleep. She took an extra Prilosec without relief and Tylenol without relief. She felt uncomfortable laying flat and felt the urge to sit upright. This morning she vomited clear liquid. The chest pain persisted as a pressure so she came to the ER. The pain has never fully gone away since last night. She has had similar pressure before with prior asthma exacerbations and afib, but finds this episode different because she isn't wheezing/coughing and is in NSR. She got 4mg  morphine in the ER with resolved the chest pressure but the shoulder blade pain persists at 5/10. The pain is not worse with inspiration, movement or palpation. She continues to feel nauseated and ate very little today. She did fly to [place] last week (3-hr plane ride). No significant LEE, interim afib (she's able to tell), fever, chills, bleeding, or syncope but does describe periodic positional orthostasis-type dizziness. She has had intermittent chest pressure/SOB with exertion in the past but not regularly - typically occurs when having asthma flare. In the ER, CXR nonacute, initial troponin neg x1, lipase WNL, labs unremarkable  except glucose 155. Pox 93-100% RA.VSS otherwise. INR subtherapeutic at 1.68.   Past Medical History  Diagnosis Date  . Allergic rhinitis   . Carpal tunnel syndrome     a. s/p surgery.  . Hypercholesteremia   . GERD (gastroesophageal reflux disease)   . Asthma   . PAF (paroxysmal atrial fibrillation)     a. s/p afib ablation at [facility] 2010 (Dr. Sharlene [name]). b. Recurrent AF s/p DCCV 06/2010.   Marland Kitchen Rheumatoid arthritis(714.0)   . OSA (obstructive sleep apnea)     a. Mild, did not tolerate OSA.  . Obesity   . Atrial flutter     a. Remotely per notes.  . Wandering (atrial) pacemaker     a. Remotely per notes.  Marland Kitchen DVT (deep venous thrombosis)     a. 1980s x2 - between birth of two sons. Saw hematology - was told she has a hypercoagulable disorder, should be on Coumadin lifelong.     Most Recent Cardiac Studies: Stress Test 06/2012 Overall Impression: Normal stress nuclear study.  LV Ejection Fraction: 81%. LV Wall Motion: NL LV Function; NL Wall Motion  2D Echo 05/2010 Study Conclusions Left ventricle: The cavity size was normal. Wall thickness was increased in a pattern of mild LVH. The estimated ejection fraction was 60%. Wall motion     Surgical History:  Past Surgical History  Procedure Laterality Date  . Cesarean section    . Carpal tunnel release    . Inguinal hernia repair    . Cholecystectomy    . Tmj arthroplasty    . Afib ablation  2010    at [facility]  Home Meds: Prior to Admission medications   Medication Sig Start Date End Date Taking? Authorizing Provider  ASMANEX 60 METERED DOSES 220 MCG/INH inhaler Inhale 1 puff into the lungs 2 (two) times daily.  04/03/10  Yes Historical Provider, [name]  calcium-vitamin D 250-100 MG-UNIT per tablet Take 1 tablet by mouth 2 (two) times daily.     Yes Historical Provider, [name]  cetirizine (ZYRTEC) 10 MG tablet Take 10 mg by mouth daily.     Yes Historical Provider, [name]  Cholecalciferol (VITAMIN D3) 1000 UNITS CAPS Take  by mouth daily.     Yes Historical Provider, [name]  flecainide (TAMBOCOR) 100 MG tablet Take 1 tablet (100 mg total) by mouth 2 (two) times daily. 06/03/12  Yes Kelly Range, [name]  fluticasone (FLONASE) 50 MCG/ACT nasal spray Place 1 spray into the nose daily.  06/02/12  Yes Historical Provider, [name]  losartan (COZAAR) 50 MG tablet Take 50 mg by mouth daily.   Yes Historical Provider, [name]  Multiple Vitamin (MULTIVITAMIN) tablet Take 1 tablet by mouth daily.     Yes Historical Provider, [name]  omeprazole (PRILOSEC) 40 MG capsule Take 40 mg by mouth daily.  04/12/10  Yes Historical Provider, [name]  PROAIR HFA 108 (90 BASE) MCG/ACT inhaler Inhale 2 puffs into the lungs every 6 (six) hours as needed for wheezing or shortness of breath.  03/22/12  Yes Historical Provider, [name]  sertraline (ZOLOFT) 50 MG tablet Take 1 tablet by mouth daily. 04/20/10  Yes Historical Provider, [name]  venlafaxine XR (EFFEXOR-XR) 37.5 MG 24 hr capsule Take 37.5 mg by mouth every morning. 11/21/12  Yes Historical Provider, [name]  verapamil (CALAN) 80 MG tablet Take 2 tablets by mouth 2 (two) times daily.  04/22/10  Yes Historical Provider, [name]  warfarin (COUMADIN) 2 MG tablet Take 4-6 mg by mouth every morning. Tues & Thurs only- 6mg  total; All other days 4 mg total   Yes Historical Provider, [name]    Allergies:  Allergies  Allergen Reactions  . Ivp Dye [Iodinated Diagnostic Agents] Anaphylaxis    Required epinephrine. Since then, has tolerated with premed.  Marland Kitchen Penicillins Anaphylaxis  . Sulfonamide Derivatives Anaphylaxis  . Aspirin     Makes asthma worse, makes stomach hurt  . Latex     Wheezing/cough   . Meperidine Hcl     Projectile vomiting   . Statins     Severe leg pain - has tried most of them except the newest one    History   Social History  . Marital Status: Married    Spouse Name: N/A    Number of Children: N/A  . Years of Education: N/A   Occupational History  . Not on file.   Social History Main Topics  . Smoking  status: Never Smoker   . Smokeless tobacco: Not on file  . Alcohol Use: Yes     Comment: occasional beer - 1x/month  . Drug Use: No  . Sexual Activity: Not on file   Other Topics Concern  . Not on file   Social History Narrative  . No narrative on file     Family History  Problem Relation Age of Onset  . Lupus    . Stroke Mother   . Heart attack Mother     2 previous MIs, 3 stents - CAD first diagnosed 44  . Heart attack Paternal Grandfather     Died at 10 of massive MI  . Stroke      Multiple family members  .  Thyroid disease      Siblings    Review of Systems: General: negative for chills, fever Cardiovascular: see above Dermatological: negative for rash Respiratory: negative for cough or wheezing Urologic: negative for hematuria Abdominal: negative for bright red blood per rectum, melena, or hematemesis Neurologic: negative for visual changes, syncope. Occasional dizziness when standing up at church or standing up out of car. No full loc. All other systems reviewed and are otherwise negative except as noted above.  Labs:   Lab Results  Component Value Date   WBC 8.3 01/12/2013   HGB 13.7 01/12/2013   HCT 40.2 01/12/2013   MCV 90.1 01/12/2013   PLT 342 01/12/2013     Recent Labs Lab 01/12/13 1555  NA 136  K 3.5  CL 99  CO2 25  BUN 15  CREATININE 0.63  CALCIUM 9.7  PROT 7.4  BILITOT 0.2*  ALKPHOS 88  ALT 24  AST 18  GLUCOSE 155*   Radiology/Studies:  Dg Chest Portable 1 View  01/12/2013   CLINICAL DATA:  Chest pain.  EXAM: PORTABLE CHEST - 1 VIEW  COMPARISON:  12/08/2010.  FINDINGS: The cardiac silhouette, mediastinal and hilar contours are within normal limits given the AP projection and portable technique. The lungs are clear despite low lung volumes. No pleural effusion. The bony thorax is intact.  IMPRESSION: No acute cardiopulmonary findings.   Electronically Signed   By: Loralie Champagne M.D.   On: 01/12/2013 16:00    EKG: NSR 78bpm,  incomplete RBBB, TWI V2, low voltage with poor R wave progression, nonspecific ST-T changes  Physical Exam: Blood pressure 146/70, pulse 71, temperature 99.4 F--> recheck 97.7, resp. rate 15, SpO2 93.00%. General: Well developed, well nourished WF in no acute distress. Head: Normocephalic, atraumatic, sclera non-icteric, no xanthomas, nares are without discharge.  Neck: Negative for carotid bruits. JVD not elevated. Lungs: Clear bilaterally to auscultation without wheezes, rales, or rhonchi. Breathing is unlabored. Heart: RRR with S1 S2. Soft SEM at the base.  Abdomen: Soft, non-tender, non-distended with normoactive bowel sounds. No hepatomegaly. No rebound/guarding. No obvious abdominal masses. Msk:  Strength and tone appear normal for age. Extremities: No clubbing or cyanosis. No edema.  Distal pedal pulses are 2+ and equal bilaterally. Neuro: Alert and oriented X 3. No focal deficit. No facial asymmetry. Moves all extremities spontaneously. Psych:  Responds to questions appropriately with a normal affect.    ASSESSMENT AND PLAN:  1. Chest pain - typical and atypical features. Despite several hours of pain, no evidence of ischemia thus far so ACS felt less likely on differential but still possible given risk factors of HL & family hx. Equal pulses and not markedly hypertensive so dissection also less likely. She also has hx of hypercoag state and is subtherapeutic on Coumadin today. We feel she requires CT angio to r/o PE (with premedication.) Will start on IV heparin while these things are ruled out. Increase PPI to BID. PRN pain medicine. Check echocardiogram given soft systolic murmur. 2. Paroxysmal atrial fibrillation, maintaining NSR on flecainide - discussed continuation of flecainide with [name]. Given lack of objective evidence of ischemia thus far, will continue for now because we do not want her to go back into atrial fib. However, if this changes, will need to discontinue this medicine.  Will hold Coumadin for tonight in case cath or procedures are needed but bridge with IV heparin. 3. History of DVTx2, on lifelong Coumadin for hypercoagulable disorder - heparin per pharmacy for now. 4. Hyperglycemia -  check A1C. 5. Hyperlipidemia, intolerant to statins - check lipids. 6. Allergy to IV dye - pre-medicate for CTA.  7. Asthma, without exacerbation - continue home regimen. 8. Intolerance to ASA - this has exacerbated asthma in the past. Will avoid for now unless enzymes turn positive.  Signed, Ronie Spies PA-C 01/12/2013, 5:46 PM Seen in ER with Ronie Spies, PA-C. History reviewed, labs, EKGs xrays reviewed. She is still mildly uncomfortable in her chest. She is oxygenating well on room air. Rhythm is NSR. EKG shows mild incomplete RBBB and no acute ST-T changes of ischemia. Exam reveals clear lungs to auscultation. There is a soft systolic ejection murmur at the base which she states she has been told of before. Prior echo 2012 showed normal aortic valve. Her extremities show no evidence of phlebitis or edema. With her history of recent airplane travel and history of hypercoagulable state, and subtherapeutic INR today, will proceed with CT angiogram to rule out pulmonary embolism as the cause of her chest pain. She will be premedicated for dye allergy. Agree with assessment and plan above.

## 2013-01-12 NOTE — ED Notes (Addendum)
Onset yesterday 8pm mid chest pain, radiating to jaw and back.    Pain is 7/10, constant.  Pt took extra Prilosec yesterday and today with no relief.  Also, took Tylenol with no relief.  Pt has nausea and shortness of breath.  Vomited x 2 last night.

## 2013-01-12 NOTE — ED Notes (Signed)
IV team responded  

## 2013-01-12 NOTE — ED Notes (Signed)
IV team at bedside 

## 2013-01-12 NOTE — ED Notes (Signed)
Called CT and advised pre-medications are given.  Will come get pt in 1 hour.

## 2013-01-12 NOTE — ED Provider Notes (Signed)
CSN: [id number]     Arrival date & time 01/12/13  1458 History   First MD Initiated Contact with Patient 01/12/13 1516     Chief Complaint  Patient presents with  . Chest Pain    Patient is a 57 y.o. female presenting with chest pain. The history is provided by the patient.  Chest Pain Pain location:  L chest Pain quality: pressure   Pain radiates to:  Does not radiate Pain severity:  Moderate Onset quality:  Gradual Duration:  20 hours Timing:  Constant Progression:  Unchanged Chronicity:  New Relieved by:  Nothing Exacerbated by: nothing. Associated symptoms: diaphoresis, fatigue, shortness of breath, vomiting and weakness   Associated symptoms: no abdominal pain, no fever and no headache   pt reports that starting last night after dinner, she had onset of diaphoresis, nausea/vomiting and left chest pressure.  She also reports pain in her upper back as well, though does not seem to radiate from chest (told nurse radiated to jaw) No focal weakness She has not had this recently Pain has been constant since last night  Past Medical History  Diagnosis Date  . Allergic rhinitis   . Carpal tunnel syndrome   . TMJ syndrome   . Hypercholesteremia   . GERD (gastroesophageal reflux disease)   . Asthma   . PAF (paroxysmal atrial fibrillation)     s/p afib ablation at [facility] 2010  . Rheumatoid arthritis(714.0)   . OSA on CPAP     non compliant  . Obesity    Past Surgical History  Procedure Laterality Date  . Cesarean section    . Carpal tunnel release    . Inguinal hernia repair    . Cholecystectomy    . Tmj arthroplasty    . Afib ablation  2010    at [facility]   Family History  Problem Relation Age of Onset  . Lupus    . Stroke    . Heart attack     History  Substance Use Topics  . Smoking status: Never Smoker   . Smokeless tobacco: Not on file  . Alcohol Use: Yes     Comment: wine rarely   OB History   Grav Para Term Preterm Abortions TAB SAB Ect  Mult Living                 Review of Systems  Constitutional: Positive for diaphoresis and fatigue. Negative for fever.  Respiratory: Positive for shortness of breath.   Cardiovascular: Positive for chest pain.  Gastrointestinal: Positive for vomiting. Negative for abdominal pain and blood in stool.  Neurological: Positive for weakness. Negative for headaches.  All other systems reviewed and are negative.    Allergies  Latex; Meperidine hcl; Penicillins; and Sulfonamide derivatives  Home Medications   Current Outpatient Rx  Name  Route  Sig  Dispense  Refill  . ASMANEX 60 METERED DOSES 220 MCG/INH inhaler      1 spray as directed.         . calcium-vitamin D 250-100 MG-UNIT per tablet   Oral   Take 1 tablet by mouth 2 (two) times daily.           . cetirizine (ZYRTEC) 10 MG tablet   Oral   Take 10 mg by mouth daily.           . Cholecalciferol (VITAMIN D3) 1000 UNITS CAPS   Oral   Take by mouth daily.           Marland Kitchen  flecainide (TAMBOCOR) 100 MG tablet   Oral   Take 1 tablet (100 mg total) by mouth 2 (two) times daily.   60 tablet   5     Pt needs to make appt to obtain more refills   . fluticasone (FLONASE) 50 MCG/ACT nasal spray               . losartan (COZAAR) 50 MG tablet      TAKE 1 TABLET EVERY DAY   90 tablet   3   . Multiple Vitamin (MULTIVITAMIN) tablet   Oral   Take 1 tablet by mouth daily.           Marland Kitchen omeprazole (PRILOSEC) 40 MG capsule   Oral   Take 1 tablet by mouth daily.         Marland Kitchen PROAIR HFA 108 (90 BASE) MCG/ACT inhaler               . sertraline (ZOLOFT) 50 MG tablet   Oral   Take 1 tablet by mouth daily.         . verapamil (CALAN) 80 MG tablet   Oral   Take 2 tablets by mouth 3 (three) times daily.          Marland Kitchen warfarin (COUMADIN) 2 MG tablet      TAKE AS DIRECTED BY ANTICOAGULATION CLINIC   210 tablet   1    BP 145/97  Pulse 80  Temp(Src) 99.4 F (37.4 C)  Resp 16  SpO2 100% Physical  Exam CONSTITUTIONAL: Well developed/well nourished HEAD: Normocephalic/atraumatic EYES: EOMI/PERRL ENMT: Mucous membranes moist NECK: supple no meningeal signs SPINE:entire spine nontender CV: S1/S2 noted, no murmurs/rubs/gallops noted LUNGS: Lungs are clear to auscultation bilaterally, no apparent distress ABDOMEN: soft, nontender, no rebound or guarding GU:no cva tenderness NEURO: Pt is awake/alert, moves all extremitiesx4 EXTREMITIES: pulses normal, full ROM SKIN: warm, color normal PSYCH: no abnormalities of mood noted  ED Course  Procedures    3:59 PM Pt reports she can not tolerate NTG and can not tolerate ASA Will defer ASA For now  She reports morphine may help her pain 4:49 PM D/w cardiology will evaluate for admission Pt stable in the ED  Labs Review Labs Reviewed  COMPREHENSIVE METABOLIC PANEL  CBC WITH DIFFERENTIAL  LIPASE, BLOOD  PROTIME-INR   Imaging Review No results found.  EKG Interpretation     Ventricular Rate:  78 PR Interval:  186 QRS Duration: 102 QT Interval:  400 QTC Calculation: 456 R Axis:   -11 Text Interpretation:  Normal sinus rhythm Incomplete right bundle branch block Anterior infarct , age undetermined Abnormal ECG            MDM  No diagnosis found. Nursing notes including past medical history and social history reviewed and considered in documentation xrays reviewed and considered Labs/vital reviewed and considered Previous records reviewed and considered - h/o afib s/p ablation     Joya Gaskins, MD 01/12/13 1649

## 2013-01-12 NOTE — ED Notes (Signed)
Called CT re: CT Angio, per CT order for Benadryl needs to be changed to 50mg , call when premedication done and timer will start.

## 2013-01-12 NOTE — ED Notes (Signed)
Reports attempted x1.

## 2013-01-12 NOTE — ED Notes (Signed)
Cp started yesterday pm did have n/v/sob pain rads to back and shoulder blades afib hx

## 2013-01-12 NOTE — Progress Notes (Signed)
ANTICOAGULATION CONSULT NOTE - Initial Consult  Pharmacy Consult for heparin Indication: r/o PE, r/o ACS. History DVT x2 and afib  Allergies  Allergen Reactions  . Ivp Dye [Iodinated Diagnostic Agents] Anaphylaxis    Required epinephrine. Since then, has tolerated with premed.  Marland Kitchen Penicillins Anaphylaxis  . Sulfonamide Derivatives Anaphylaxis  . Aspirin     Makes asthma worse, makes stomach hurt  . Latex     Wheezing/cough   . Meperidine Hcl     Projectile vomiting   . Statins     Severe leg pain - has tried most of them except the newest one    Patient Measurements: Wt= 104.5kg (per patient) Ht= 5' 5'' IBW=  57kg Heparin dosing weight= 81kg  Vital Signs: Temp: 97.7 F (36.5 C) (10/23 1704) Temp src: Oral (10/23 1704) BP: 131/76 mmHg (10/23 1806) Pulse Rate: 68 (10/23 1806)  Labs:  Recent Labs  01/12/13 1555  HGB 13.7  HCT 40.2  PLT 342  LABPROT 19.3*  INR 1.68*  CREATININE 0.63    The CrCl is unknown because both a height and weight (above a minimum accepted value) are required for this calculation.   Medical History: Past Medical History  Diagnosis Date  . Allergic rhinitis   . Carpal tunnel syndrome     a. s/p surgery.  . Hypercholesteremia   . GERD (gastroesophageal reflux disease)   . Asthma   . PAF (paroxysmal atrial fibrillation)     a. s/p afib ablation at [facility] 2010 (Dr. Sharlene Dory). b. Recurrent AF s/p DCCV 06/2010.   Marland Kitchen Rheumatoid arthritis(714.0)   . OSA (obstructive sleep apnea)     a. Mild, did not tolerate OSA.  . Obesity   . Atrial flutter     a. Remotely per notes.  . Wandering (atrial) pacemaker     a. Remotely per notes.  Marland Kitchen DVT (deep venous thrombosis)     a. 1980s x2 - between birth of two sons. Saw hematology - was told she has a hypercoagulable disorder, should be on Coumadin lifelong.     Assessment: 57 yo female here with CP for r/o PE and r/o ACS to begin heparin. Patient also noted on coumadin PTA for afib and  recurrent DVT. INR today= 1.68, hg/hct= 13.7/40.2. Coumadin on hold for possible cath or potential procedure.  Goal of Therapy:  Heparin level 0.3-0.7 units/ml Monitor platelets by anticoagulation protocol: Yes   Plan:  - 3000 units IV bolus followed by 1300 units/hr (~ 16 units/kg/hr) -Heparin level in 6 hours and daily wth CBC daily  Harland German, Pharm D 01/12/2013 6:38 PM

## 2013-01-13 DIAGNOSIS — I369 Nonrheumatic tricuspid valve disorder, unspecified: Secondary | ICD-10-CM

## 2013-01-13 DIAGNOSIS — R079 Chest pain, unspecified: Secondary | ICD-10-CM | POA: Diagnosis not present

## 2013-01-13 LAB — BASIC METABOLIC PANEL
CO2: 22 mEq/L (ref 19–32)
Chloride: 100 mEq/L (ref 96–112)
GFR calc Af Amer: >90 mL/min (ref >90)
Potassium: 4.6 mEq/L (ref 3.5–5.1)
Sodium: 137 mEq/L (ref 135–145)

## 2013-01-13 LAB — HEPARIN LEVEL (UNFRACTIONATED)
Heparin Unfractionated: <0.1 IU/mL — ABNORMAL LOW (ref 0.30–0.70)
Heparin Unfractionated: 0.81 IU/mL — ABNORMAL HIGH (ref 0.30–0.70)

## 2013-01-13 LAB — CBC
HCT: 40.4 % (ref 36.0–46.0)
MCH: 30.7 pg (ref 26.0–34.0)
MCV: 91.2 fL (ref 78.0–100.0)
Platelets: 361 10*3/uL (ref 150–400)
RBC: 4.43 MIL/uL (ref 3.87–5.11)
RDW: 13 % (ref 11.5–15.5)

## 2013-01-13 LAB — MRSA PCR SCREENING: MRSA by PCR: NEGATIVE

## 2013-01-13 LAB — HEMOGLOBIN A1C: Hgb A1c MFr Bld: 6.1 % — ABNORMAL HIGH (ref <5.7)

## 2013-01-13 LAB — LIPID PANEL
Cholesterol: 238 mg/dL — ABNORMAL HIGH (ref 0–200)
HDL: 48 mg/dL (ref >39)
LDL Cholesterol: 141 mg/dL — ABNORMAL HIGH (ref 0–99)
Triglycerides: 247 mg/dL — ABNORMAL HIGH (ref <150)

## 2013-01-13 LAB — PROTIME-INR: INR: 1.79 — ABNORMAL HIGH (ref 0.00–1.49)

## 2013-01-13 LAB — TROPONIN I
Troponin I: <0.3 ng/mL (ref <0.30)
Troponin I: <0.3 ng/mL (ref <0.30)

## 2013-01-13 MED ORDER — WARFARIN SODIUM 6 MG PO TABS: 6.0000 mg | ORAL_TABLET | ORAL | Status: DC

## 2013-01-13 MED ORDER — HEPARIN BOLUS VIA INFUSION
2000.0000 [IU] | Freq: Once | INTRAVENOUS | Status: AC
  Administered 2013-01-13: 2000 [IU] via INTRAVENOUS
  Filled 2013-01-13: qty 2000

## 2013-01-13 MED ORDER — LOSARTAN POTASSIUM 50 MG PO TABS
100.0000 mg | ORAL_TABLET | Freq: Every day | ORAL | Status: DC
  Administered 2013-01-13: 100 mg via ORAL
  Filled 2013-01-13: qty 2

## 2013-01-13 MED ORDER — WARFARIN - PHARMACIST DOSING INPATIENT: Freq: Every day | Status: DC

## 2013-01-13 MED ORDER — WARFARIN SODIUM 6 MG PO TABS
6.0000 mg | ORAL_TABLET | Freq: Once | ORAL | Status: DC
  Filled 2013-01-13: qty 1

## 2013-01-13 MED ORDER — WARFARIN SODIUM 4 MG PO TABS: 4.0000 mg | ORAL_TABLET | ORAL | Status: DC

## 2013-01-13 NOTE — Discharge Summary (Signed)
Discharge Summary   Patient ID: CHIMAMANDA SIEGFRIED,  MRN: [id number], DOB/AGE: 08-13-1955 57 y.o.  Admit date: 01/12/2013 Discharge date: 01/13/2013  Primary Care Provider: Sissy Hoff Primary Cardiologist: J. Allred, MD   Discharge Diagnoses Principal Problem:   Chest pain  **No objective evidence of ischemia.  **CTA of chest negative for PE. Active Problems:   Paroxysmal atrial fibrillation  **Maintaining sinus on flecainide.  **Chronic coumadin   HYPERCHOLESTEROLEMIA   GERD   History of DVT (deep vein thrombosis)   Hyperglycemia   ASTHMA  Allergies Allergies  Allergen Reactions  . Ivp Dye [Iodinated Diagnostic Agents] Anaphylaxis    Required epinephrine. Since then, has tolerated with premed.  Marland Kitchen Penicillins Anaphylaxis  . Sulfonamide Derivatives Anaphylaxis  . Aspirin     Makes asthma worse, makes stomach hurt  . Latex     Wheezing/cough   . Meperidine Hcl     Projectile vomiting   . Statins     Severe leg pain - has tried most of them except the newest one   Procedures  CT Angio of the Chest with Contrast 10.23.2014  IMPRESSION: No significant acute pulmonary embolus by CTA.  Low volume exam with vascular crowding.  Basilar atelectasis. _____________   2D Echocardiogram 10.24.2014  Study Conclusions  - Left ventricle: The cavity size was normal. Wall thickness was increased in a pattern of mild LVH. Systolic function was normal. The estimated ejection fraction was in the range of 60% to 65%. Wall motion was normal; there were no regional wall motion abnormalities. Doppler parameters are consistent with abnormal left ventricular relaxation (grade 1 diastolic dysfunction). - Left atrium: The atrium was mildly dilated. - Atrial septum: No defect or patent foramen ovale was identified. - Pericardium, extracardiac: A trivial pericardial effusion was identified posterior to the heart. _____________   History of Present Illness  57 year old  female with prior history of paroxysmal atrial fibrillation status post pulmonary vein isolation in 2010 subsequent cardioversions who was maintained on flecainide and Coumadin therapy. She also has a history of hypercoagulable state with remote DVTs x2.  She was in her usual state of health until the night prior to admission when following dinner, she developed diaphoresis and pain in the center of her chest associated with nausea and restlessness. His move to her upper back. She had a restless night of sleep and discomfort was somewhat worse with lying down. Pain was not relieved with additional Prilosec or acetaminophen it persisted throughout the day on October 23. As result, she presented to the [facility] where ECG was nonacute and initial troponin was normal. Her INR was subtherapeutic at 1.68 which reported a recent flight from [place] which was otherwise uneventful. She did not have any noticeable lower extremity swelling. CT angiography of the chest was performed and was negative for pulmonary embolus. She was observed for further evaluation.  Hospital Course  Following admission, chest and back pain resolved completely. Cardiac markers have remained negative. She has been maintained on her flecainide therapy and has not had any atrial fibrillation. A 2-D echocardiogram has been performed and showed normal LV function with LVH. She will be discharged home this afternoon in good condition with electrophysiology followup arranged for next month.  Discharge Vitals Blood pressure 141/93, pulse 79, temperature 98 F (36.7 C), temperature source Oral, resp. rate 24, height 5\' 5"  (1.651 m), weight 234 lb 5.6 oz (106.3 kg), SpO2 93.00%.  Filed Weights   01/12/13 2217  Weight: 234 lb 5.6 oz (106.3  kg)   Labs  CBC  Recent Labs  01/12/13 1555 01/13/13 0139  WBC 8.3 6.8  NEUTROABS 4.2  --   HGB 13.7 13.6  HCT 40.2 40.4  MCV 90.1 91.2  PLT 342 361   Basic Metabolic Panel  Recent Labs   16/10/96 1555 01/13/13 0139  NA 136 137  K 3.5 4.6  CL 99 100  CO2 25 22  GLUCOSE 155* 247*  BUN 15 15  CREATININE 0.63 0.70  CALCIUM 9.7 9.7   Liver Function Tests  Recent Labs  01/12/13 1555  AST 18  ALT 24  ALKPHOS 88  BILITOT 0.2*  PROT 7.4  ALBUMIN 3.7    Recent Labs  01/12/13 1555  LIPASE 30   Cardiac Enzymes  Recent Labs  01/12/13 2256 01/13/13 0620 01/13/13 0914  TROPONINI <0.30 <0.30 <0.30   Fasting Lipid Panel  Recent Labs  01/13/13 0139  CHOL 238*  HDL 48  LDLCALC 141*  TRIG 247*  CHOLHDL 5.0   Disposition  Pt is being discharged home today in good condition.  Follow-up Plans & Appointments      Follow-up Information   Follow up with [facility] On 01/23/2013. (8 am)    Contact information:   [address] [place] [address]       Follow up with Hillis Range, MD On 02/17/2013. (10:30 AM)    Specialty:  Cardiology   Contact information:   [address] [place] [address] [phone]      Discharge Medications    Medication List         ASMANEX 60 METERED DOSES 220 MCG/INH inhaler  Generic drug:  mometasone  Inhale 1 puff into the lungs 2 (two) times daily.     calcium-vitamin D 250-100 MG-UNIT per tablet  Take 1 tablet by mouth 2 (two) times daily.     cetirizine 10 MG tablet  Commonly known as:  ZYRTEC  Take 10 mg by mouth daily.     flecainide 100 MG tablet  Commonly known as:  TAMBOCOR  Take 1 tablet (100 mg total) by mouth 2 (two) times daily.     fluticasone 50 MCG/ACT nasal spray  Commonly known as:  FLONASE  Place 1 spray into the nose daily.     losartan 50 MG tablet  Commonly known as:  COZAAR  Take 50 mg by mouth daily.     multivitamin tablet  Take 1 tablet by mouth daily.     omeprazole 40 MG capsule  Commonly known as:  PRILOSEC  Take 40 mg by mouth daily.     PROAIR HFA 108 (90 BASE) MCG/ACT inhaler  Generic drug:  albuterol  Inhale 2  puffs into the lungs every 6 (six) hours as needed for wheezing or shortness of breath.     sertraline 50 MG tablet  Commonly known as:  ZOLOFT  Take 1 tablet by mouth daily.     venlafaxine XR 37.5 MG 24 hr capsule  Commonly known as:  EFFEXOR-XR  Take 37.5 mg by mouth every morning.     verapamil 80 MG tablet  Commonly known as:  CALAN  Take 2 tablets by mouth 2 (two) times daily.     Vitamin D3 1000 UNITS Caps  Take by mouth daily.     warfarin 2 MG tablet  Commonly known as:  COUMADIN  Take 4-6 mg by mouth every morning. Tues & Thurs only- 6mg  total; All  other days 4 mg total       Outstanding Labs/Studies  None  Duration of Discharge Encounter   Greater than 30 minutes including physician time.  Signed, Nicolasa Ducking NP 01/13/2013, 1:03 PM

## 2013-01-13 NOTE — Progress Notes (Signed)
Subjective:  Chest and back pain have resolved. Troponins normal EKG this am shows no ischemic changes. CT angiogram normal.  Objective:  Vital Signs in the last 24 hours: Temp:  [97.7 F (36.5 C)-99.4 F (37.4 C)] 97.8 F (36.6 C) (10/24 0752) Pulse Rate:  [59-93] 79 (10/24 0752) Resp:  [12-22] 13 (10/24 0752) BP: (96-152)/(52-122) 142/74 mmHg (10/24 0752) SpO2:  [92 %-100 %] 92 % (10/24 0752) Weight:  [234 lb 5.6 oz (106.3 kg)] 234 lb 5.6 oz (106.3 kg) (10/23 2217)  Intake/Output from previous day: 10/23 0701 - 10/24 0700 In: 168.9 [I.V.:168.9] Out: 950 [Urine:950] Intake/Output from this shift:    . calcium-vitamin D  1 tablet Oral BID WC  . cholecalciferol  1,000 Units Oral Daily  . flecainide  100 mg Oral BID  . fluticasone  1 spray Each Nare Daily  . fluticasone  2 puff Inhalation BID  . loratadine  10 mg Oral Daily  . losartan  50 mg Oral Daily  . multivitamin with minerals  1 tablet Oral Daily  . pantoprazole  40 mg Oral BID  . sertraline  50 mg Oral Daily  . sodium chloride  3 mL Intravenous Q12H  . venlafaxine XR  37.5 mg Oral Q breakfast  . verapamil  160 mg Oral BID   . heparin 1,700 Units/hr (01/13/13 0311)    Physical Exam: The patient appears to be in no distress.  Head and neck exam reveals that the pupils are equal and reactive.  The extraocular movements are full.  There is no scleral icterus.  Mouth and pharynx are benign.  No lymphadenopathy.  No carotid bruits.  The jugular venous pressure is normal.  Thyroid is not enlarged or tender.  Chest is clear to percussion and auscultation.  No rales or rhonchi.  Expansion of the chest is symmetrical.  Heart reveals soft systolic murmur at base.  The abdomen is soft and nontender.  Bowel sounds are normoactive.  There is no hepatosplenomegaly or mass.  There are no abdominal bruits.  Extremities reveal no phlebitis or edema.  Pedal pulses are good.  There is no cyanosis or  clubbing.  Neurologic exam is normal strength and no lateralizing weakness.  No sensory deficits.  Integument reveals no rash  Lab Results:  Recent Labs  01/12/13 1555 01/13/13 0139  WBC 8.3 6.8  HGB 13.7 13.6  PLT 342 361    Recent Labs  01/12/13 1555 01/13/13 0139  NA 136 137  K 3.5 4.6  CL 99 100  CO2 25 22  GLUCOSE 155* 247*  BUN 15 15  CREATININE 0.63 0.70    Recent Labs  01/12/13 2256 01/13/13 0620  TROPONINI <0.30 <0.30   Hepatic Function Panel  Recent Labs  01/12/13 1555  PROT 7.4  ALBUMIN 3.7  AST 18  ALT 24  ALKPHOS 88  BILITOT 0.2*    Recent Labs  01/13/13 0139  CHOL 238*   No results found for this basename: PROTIME,  in the last 72 hours  Imaging: Ct Angio Chest Pe W/cm &/or Wo Cm  01/12/2013   CLINICAL DATA:  Mid chest pain between the shoulder blades, shortness of breath, nausea and vomiting  EXAM: CT ANGIOGRAPHY CHEST WITH CONTRAST  TECHNIQUE: Multidetector CT imaging of the chest was performed using the standard protocol during bolus administration of intravenous contrast. Multiplanar CT image reconstructions including MIPs were obtained to evaluate the vascular anatomy.  CONTRAST:  OMNIPAQUE IOHEXOL 350 MG/ML SOLN  COMPARISON:  06/19/2010  FINDINGS: No significant central or proximal hilar filling defect or pulmonary embolus by CTA. Poor opacification of the more peripheral branches to exclude small PE. Thoracic aorta appears intact. No significant dissection or aneurysm. No pericardial effusion. Normal heart size. No pleural fluid. No adenopathy.  Included upper abdomen demonstrates no acute findings.  Lung windows demonstrate very low volume with vascular crowding and basilar atelectasis. Minor degenerative changes of the spine diffusely.  Review of the MIP images confirms the above findings.  IMPRESSION: No significant acute pulmonary embolus by CTA.  Low volume exam with vascular crowding.  Basilar atelectasis.   Electronically  Signed   By: Ruel Favors M.D.   On: 01/12/2013 21:04   Dg Chest Portable 1 View  01/12/2013   CLINICAL DATA:  Chest pain.  EXAM: PORTABLE CHEST - 1 VIEW  COMPARISON:  12/08/2010.  FINDINGS: The cardiac silhouette, mediastinal and hilar contours are within normal limits given the AP projection and portable technique. The lungs are clear despite low lung volumes. No pleural effusion. The bony thorax is intact.  IMPRESSION: No acute cardiopulmonary findings.   Electronically Signed   By: Loralie Champagne M.D.   On: 01/12/2013 16:00    Cardiac Studies: Telemetry shows normal sinus rhythm. Two-dimensional echocardiogram is pending. Assessment/Plan:  1. Chest pain - myocardial infarction ruled out.  No evidence of pulmonary embolus by CT angiogram of chest.  2. Paroxysmal atrial fibrillation, maintaining NSR on flecainide .  3. History of DVTx2, on lifelong Coumadin for hypercoagulable disorder - resume Coumadin since she does not require any invasive procedures on this admission. 4. Hyperglycemia - check A1C.  5. Hyperlipidemia, intolerant to statins - LDL 141.  Defer to PCP.  May wish to consider WelChol 6. Allergy to IV dye - pre-medicated for CTA.  Tolerated without adverse effect 7. Asthma, without exacerbation - continue home regimen.  8. Intolerance to ASA -  9. essential hypertension.  Blood pressure still running slightly high.  We'll increase losartan to 100 mg daily  Plan: Await results of echocardiogram today.  Anticipate discharge later today.  Resume Coumadin.  LOS: 1 day    [name] 01/13/2013, 8:04 AM

## 2013-01-13 NOTE — Progress Notes (Signed)
Echocardiogram 2D Echocardiogram has been performed.  [name] 01/13/2013, 10:12 AM

## 2013-01-13 NOTE — Progress Notes (Signed)
Inpatient Diabetes Program Recommendations  AACE/ADA: New Consensus Statement on Inpatient Glycemic Control (2013)  Target Ranges:  Prepandial:   less than 140 mg/dL      Peak postprandial:   less than 180 mg/dL (1-2 hours)      Critically ill patients:  140 - 180 mg/dL  Results for Kelly, [name] (MRN [id number]) as of 01/13/2013 08:40  Ref. Range 01/12/2013 15:55 01/12/2013 16:16 01/12/2013 22:56 01/13/2013 01:39  Glucose Latest Range: 70-99 mg/dL 952 (H)   841 (H)   Inpatient Diabetes Program Recommendations HgbA1C: pending Consider monitoring CBGs TIDHS. Thank you  Piedad Climes BSN, RN,CDE Inpatient Diabetes Coordinator [phone] (team pager)

## 2013-01-13 NOTE — Progress Notes (Addendum)
ANTICOAGULATION CONSULT NOTE - Follow-up Consult  Pharmacy Consult for heparin/coumadin Indication: r/o PE, r/o ACS. History DVT x2 and afib  Allergies  Allergen Reactions  . Ivp Dye [Iodinated Diagnostic Agents] Anaphylaxis    Required epinephrine. Since then, has tolerated with premed.  Marland Kitchen Penicillins Anaphylaxis  . Sulfonamide Derivatives Anaphylaxis  . Aspirin     Makes asthma worse, makes stomach hurt  . Latex     Wheezing/cough   . Meperidine Hcl     Projectile vomiting   . Statins     Severe leg pain - has tried most of them except the newest one    Patient Measurements: Wt= 106 kg Ht= 5' 5'' IBW=  57kg Heparin dosing weight= 81kg  Vital Signs: Temp: 97.8 F (36.6 C) (10/24 0752) Temp src: Oral (10/24 0752) BP: 142/74 mmHg (10/24 0752) Pulse Rate: 79 (10/24 0752)  Labs:  Recent Labs  01/12/13 1555 01/12/13 2256 01/13/13 0139 01/13/13 0620 01/13/13 0914  HGB 13.7  --  13.6  --   --   HCT 40.2  --  40.4  --   --   PLT 342  --  361  --   --   LABPROT 19.3*  --  20.3*  --   --   INR 1.68*  --  1.79*  --   --   HEPARINUNFRC  --   --  <0.10*  --  0.81*  CREATININE 0.63  --  0.70  --   --   TROPONINI  --  <0.30  --  <0.30 <0.30    Estimated Creatinine Clearance: 92.8 ml/min (by C-G formula based on Cr of 0.7).  Assessment: 57 yo female on coumadin PTA for afib and h/o remote recurrent DVT - INR subtherapeutic on admit (PTA dose 4mg  daily except for 6mg  on Tues/Thur). Coumadin held and heparin started here for r/o PE, r/o ACS. Heparin level 0.81 this a.m. - supratherapeutic. Noted CP has resolved and CT neg for PE. To restart coumadin today. INR 1.79 (slightly subtherapeutic). CBC stable. No bleeding noted. Also noted that pt may be discharged home today.  Goal of Therapy:  Heparin level 0.3-0.7 units/ml Monitor platelets by anticoagulation protocol: Yes   Plan:  1) Decrease heparin gtt to 1550 units/hr 2) F/u 6 hr heparin level if pt still here 3)  Coumadin 6mg  po today then restart home dose - 4mg  daily except for 6mg  on Tues and Thur 4) Daily INR  **If pt d/c home today - consider 6mg  tonight then resume home dose - will need INR f/u in 3-4 days**  Christoper Fabian, PharmD, BCPS Clinical pharmacist, pager [phone]  01/13/2013 11:20 AM

## 2013-01-13 NOTE — Progress Notes (Signed)
Patient cleared for discharge per Flavia Shipper, NP.  Discharge instructions, D/C medications and follow up appointments reviewed with the patient.  Patient voices understanding.  Ambulatory to door. Home via POV with her husband driving.

## 2013-01-13 NOTE — Progress Notes (Signed)
ANTICOAGULATION CONSULT NOTE - Follow Up Consult  Pharmacy Consult for heparin Indication: r/o PE/ACS w/ hypercoag  Labs:  Recent Labs  01/12/13 1555 01/12/13 2256 01/13/13 0139  HGB 13.7  --  13.6  HCT 40.2  --  40.4  PLT 342  --  361  LABPROT 19.3*  --  20.3*  INR 1.68*  --  1.79*  HEPARINUNFRC  --   --  <0.10*  CREATININE 0.63  --  0.70  TROPONINI  --  <0.30  --     Assessment: 57yo female undetectable on heparin with initial dosing for CP w/ low INR, Coumadin on hold.  Goal of Therapy:  Heparin level 0.3-0.7 units/ml   Plan:  Will rebolus with heparin 2000 units (small amount given INR) and increase gtt by 4 units/kg/hr to 1700 units/hr and check level in 6hr.  Vernard Gambles, PharmD, BCPS  01/13/2013,3:08 AM

## 2013-01-13 NOTE — Progress Notes (Signed)
Pt admitted from the ED with complaints of chest pain, 3/10 when she got on the floor. Pt is alert x 4, vital signs are stable and NSR on the heart monitor. Admission hx and assessment has been completed and care plan initiated. Will continue to monitor pt.

## 2013-01-23 ENCOUNTER — Ambulatory Visit (INDEPENDENT_AMBULATORY_CARE_PROVIDER_SITE_OTHER): Payer: 59 | Admitting: *Deleted

## 2013-01-23 ENCOUNTER — Telehealth: Payer: Self-pay

## 2013-01-23 DIAGNOSIS — I480 Paroxysmal atrial fibrillation: Secondary | ICD-10-CM

## 2013-01-23 DIAGNOSIS — I4891 Unspecified atrial fibrillation: Secondary | ICD-10-CM

## 2013-01-23 LAB — POCT INR: INR: 2.1

## 2013-01-23 NOTE — Telephone Encounter (Signed)
Proceed with procedure if medically indicated. OK to stop coumadin 4-5 days prior to the procedure and resume immediately afterwards.

## 2013-01-23 NOTE — Telephone Encounter (Signed)
Pt to have colonoscopy on Tue 11/18.  Do you want Coumadin stopped before?  Pt recently had chest pain where PE and ACS were ruled out.  Do we have cardiac clearance to proceed with colonoscopy?  Thank you.

## 2013-01-25 ENCOUNTER — Encounter: Payer: Self-pay | Admitting: Internal Medicine

## 2013-01-26 ENCOUNTER — Other Ambulatory Visit: Payer: Self-pay

## 2013-01-27 ENCOUNTER — Other Ambulatory Visit: Payer: Self-pay | Admitting: *Deleted

## 2013-02-07 ENCOUNTER — Encounter: Payer: 59 | Admitting: Internal Medicine

## 2013-02-17 ENCOUNTER — Ambulatory Visit: Payer: 59 | Admitting: Internal Medicine

## 2013-03-01 ENCOUNTER — Encounter: Payer: Self-pay | Admitting: Internal Medicine

## 2013-03-01 ENCOUNTER — Ambulatory Visit (INDEPENDENT_AMBULATORY_CARE_PROVIDER_SITE_OTHER): Payer: 59 | Admitting: Internal Medicine

## 2013-03-01 ENCOUNTER — Ambulatory Visit (INDEPENDENT_AMBULATORY_CARE_PROVIDER_SITE_OTHER): Payer: 59 | Admitting: Pharmacist

## 2013-03-01 VITALS — BP 148/85 | HR 86 | Ht 65.0 in | Wt 241.8 lb

## 2013-03-01 DIAGNOSIS — I10 Essential (primary) hypertension: Secondary | ICD-10-CM | POA: Insufficient documentation

## 2013-03-01 DIAGNOSIS — R079 Chest pain, unspecified: Secondary | ICD-10-CM

## 2013-03-01 DIAGNOSIS — I480 Paroxysmal atrial fibrillation: Secondary | ICD-10-CM

## 2013-03-01 DIAGNOSIS — I4891 Unspecified atrial fibrillation: Secondary | ICD-10-CM

## 2013-03-01 MED ORDER — LOSARTAN POTASSIUM 100 MG PO TABS: 100.0000 mg | ORAL_TABLET | Freq: Every day | ORAL | Status: DC

## 2013-03-01 NOTE — Progress Notes (Signed)
The patient presents today for electrophysiology followup.  Since last being seen in our clinic, she has done reasonably well.  She feels that her afib has been controlled. She was admitted 10/14 with atypical chest pain.  This has resolved.  Today, she denies symptoms of  orthopnea, PND,  presyncope, syncope, or neurologic sequela.  The patient feels that she is tolerating medications without difficulties and is otherwise without complaint today.   Past Medical History  Diagnosis Date  . Allergic rhinitis   . Hypercholesteremia   . GERD (gastroesophageal reflux disease)   . Asthma   . PAF (paroxysmal atrial fibrillation)     a. s/p afib ablation at [facility] 2010 (Dr. Sharlene Dory). b. Recurrent AF s/p DCCV 06/2010.   Marland Kitchen Rheumatoid arthritis(714.0)   . OSA (obstructive sleep apnea)     a. Mild, did not tolerate OSA.  . Obesity   . Atrial flutter     a. Remotely per notes.  . Wandering (atrial) pacemaker     a. Remotely per notes.  Marland Kitchen DVT (deep venous thrombosis)     a. 1980s x2 - between birth of two sons. Saw hematology - was told she has a hypercoagulable disorder, should be on Coumadin lifelong.  . Halothane adverse reaction     narrow small opening  . PONV (postoperative nausea and vomiting)   . Difficult intubation    Past Surgical History  Procedure Laterality Date  . Cesarean section    . Carpal tunnel release    . Inguinal hernia repair    . Cholecystectomy    . Tmj arthroplasty    . Afib ablation  2010    at [facility]    Current Outpatient Prescriptions  Medication Sig Dispense Refill  . ASMANEX 60 METERED DOSES 220 MCG/INH inhaler Inhale 1 puff into the lungs 2 (two) times daily.       . calcium-vitamin D 250-100 MG-UNIT per tablet Take 1 tablet by mouth 2 (two) times daily.        . cetirizine (ZYRTEC) 10 MG tablet Take 10 mg by mouth daily.        . Cholecalciferol (VITAMIN D3) 1000 UNITS CAPS Take 1 capsule by mouth daily.       . flecainide (TAMBOCOR) 100 MG  tablet Take 1 tablet (100 mg total) by mouth 2 (two) times daily.  60 tablet  5  . fluticasone (FLONASE) 50 MCG/ACT nasal spray Place 1 spray into the nose daily.       Marland Kitchen losartan (COZAAR) 100 MG tablet Take 1 tablet (100 mg total) by mouth daily.  90 tablet  3  . Multiple Vitamin (MULTIVITAMIN) tablet Take 1 tablet by mouth daily.        Marland Kitchen omeprazole (PRILOSEC) 40 MG capsule Take 40 mg by mouth daily.       Marland Kitchen PROAIR HFA 108 (90 BASE) MCG/ACT inhaler Inhale 2 puffs into the lungs every 6 (six) hours as needed for wheezing or shortness of breath.       . venlafaxine XR (EFFEXOR-XR) 37.5 MG 24 hr capsule Take 37.5 mg by mouth every morning.      . verapamil (CALAN) 80 MG tablet Take 2 tablets by mouth 2 (two) times daily.       Marland Kitchen warfarin (COUMADIN) 2 MG tablet Take as directed by the coumadin clinic       No current facility-administered medications for this visit.    Allergies  Allergen Reactions  . Ivp Dye [Iodinated  Diagnostic Agents] Anaphylaxis    Required epinephrine. Since then, has tolerated with premed.  Marland Kitchen Penicillins Anaphylaxis  . Sulfonamide Derivatives Anaphylaxis  . Aspirin     Makes asthma worse, makes stomach hurt  . Latex     Wheezing/cough   . Meperidine Hcl     Projectile vomiting   . Statins     Severe leg pain - has tried most of them except the newest one    History   Social History  . Marital Status: Married    Spouse Name: N/A    Number of Children: N/A  . Years of Education: N/A   Occupational History  . Not on file.   Social History Main Topics  . Smoking status: Never Smoker   . Smokeless tobacco: Not on file  . Alcohol Use: Yes     Comment: occasional beer - 1x/month  . Drug Use: No  . Sexual Activity: No   Other Topics Concern  . Not on file   Social History Narrative  . No narrative on file    Family History  Problem Relation Age of Onset  . Lupus    . Stroke Mother   . Heart attack Mother     2 previous MIs, 3 stents - CAD  first diagnosed 24  . Heart attack Paternal Grandfather     Died at 33 of massive MI  . Stroke      Multiple family members  . Thyroid disease      Siblings    ROS-  All systems are reviewed and are negative except as outlined in the HPI above  Physical Exam: Filed Vitals:   03/01/13 1436  BP: 148/85  Pulse: 86  Height: 5\' 5"  (1.651 m)  Weight: 241 lb 12.8 oz (109.68 kg)    GEN- The patient is well appearing, alert and oriented x 3 today.   Head- normocephalic, atraumatic Eyes-  Sclera clear, conjunctiva pink Ears- hearing intact Oropharynx- clear Neck- supple, no JVP Lymph- no cervical lymphadenopathy Lungs- Clear to ausculation bilaterally, normal work of breathing Heart- regular rate and rhythm, no murmurs, rubs or gallops, PMI not laterally displaced GI- soft, NT, ND, + BS Extremities- no clubbing, cyanosis, + trace edema MS- no significant deformity or atrophy Skin- no rash or lesion Psych- euthymic mood, full affect Neuro- strength and sensation are intact  EKG today reveals  Sinus rhythm 86 bpm,  incomplete rbbb, otherwise normal ekg  A/p  1. afib Stable No change required today Today, I discussed novel anticoagulants including pradaxa, xarelto, and eliquis today as indicated for risk reduction in stroke and systemic emboli with nonvalvular atrial fibrillation.  Risks, benefits, and alternatives to each of these drugs were discussed at length today.  She says that given her prior h/o hypercoagulability that Dr Gwenyth Bouillon has recommended that she stay with coumadin at this time. The importance of close INR followup was discussed today.   2. Atypical chest pain Resolved  3. HTn Above goal Increase losartan to 100mg  daily Return for a bmet in 4 weeks  Return to see me in 6 months

## 2013-03-01 NOTE — Patient Instructions (Addendum)
Your physician wants you to follow-up in: 6 months with Dr Jacquiline Doe will receive a reminder letter in the mail two months in advance. If you don't receive a letter, please call our office to schedule the follow-up appointment.   Your physician recommends that you return for lab work in: 4 weeks  Around 04/03/13  Your physician has recommended you make the following change in your medication:  1) Increase Losartan to 100mg  daily

## 2013-03-03 ENCOUNTER — Telehealth: Payer: Self-pay | Admitting: Pharmacist

## 2013-03-03 NOTE — Telephone Encounter (Signed)
I left a message for patient to call us back.  She has a colonoscopy pending 04/04/13 and will need to be off warfarin 5 days prior.  Given her h/o clotting disorder and h/o DVT x 2 and AFib, Dr. Johney Frame would like patient to be bridged with lovenox.  Her last Scr was 0.70.  Weight ~ 108 kg.   Will need to discuss with patient and set up lovenox bridge for January procedure.

## 2013-03-03 NOTE — Telephone Encounter (Signed)
Message copied by Lou Miner on Fri Mar 03, 2013 12:42 PM ------      Message from: Hillis Range      Created: Fri Mar 03, 2013 10:49 AM      Regarding: RE: warfarin / colonoscopy       If she has a clotting disorder then I would prefer we bridge.            ----- Message -----         From: Gaspar Skeeters Laurelai Lepp, RPH         Sent: 03/01/2013   3:25 PM           To: Hillis Range, MD      Subject: warfarin / colonoscopy                                   Patient saw both of [place] in clinic today.  She tells me she is having a colonoscopy by Dr. Dickie La on 04/04/13.  She has a h/o DVT x 2 in the 1980's and apparently hematology told her she had a clotting disorder and needs to be on warfarin for life.  She had an ablation 2010 at [facility].  Patient would need to hold warfarin 5 days prior to colonoscopy.  Given her family history she's had to have a colonoscopy every 5 years.            Any objection to holding warfarin 5 days for colonoscopy?  Do you feel lovenox needed?       ------

## 2013-03-13 MED ORDER — ENOXAPARIN SODIUM 120 MG/0.8ML ~~LOC~~ SOLN: SUBCUTANEOUS | Status: DC

## 2013-03-13 NOTE — Telephone Encounter (Signed)
Patient and I discussed the need for lovenox bridge, and patient showed understanding for the need for bridging.  Patient is a Engineer, civil (consulting) and tells me she feels comfortable with giving herself the lovenox, and just needs the instructions given to her.  Weight is ~ 109 kg currently, and Scr 0.7 (CrCl > 100 ml/min).  Colonoscopy is 04/04/13 AM.   Her lovenox dose will be 110 mg SQ q 12 hours.  Patient instructed to: 1.  Hold warfarin 03/30/13 to 04/03/13 (5 days). 2.  Start lovenox 110 mg SQ q 12 hours on 03/31/13 AM and take last dose before procedure on 04/03/13 AM. 3.  No lovenox 04/03/13 PM, nor any lovenox on day of procedure on 04/04/13. 4.  Restart warfarin on 04/04/13 PM at dose of 4 mg qd except 6 mg Tuesday and Thursday. 5.  Restart lovenox 04/05/13 at dose 110 mg SQ q 12 hours and continue until her appointment with [place] on 04/10/13. 6.  Patient understands that if polyps are removed, she may need to postpone restarting her anticoagulation and she will defer to Dr. Marian Sorrow instruction, and will call us to let us know. 7.  Recheck INR on 04/10/13 at 4:30. 8.  Prescription for lovenox 120 mg #20 (Take 110 mg SQ q 12 hours) sent to [facility] in [place].   9.  Patient read back instruction to me to confirm understanding of plan, and will call back if any questions or changes. 10.  She verbally told me the correct way to inject lovenox into abdomen to confirm proper SQ technique.    To Dr. Johney Frame as Lorain Childes.

## 2013-03-24 ENCOUNTER — Ambulatory Visit (AMBULATORY_SURGERY_CENTER): Payer: Self-pay

## 2013-03-24 ENCOUNTER — Telehealth: Payer: Self-pay

## 2013-03-24 VITALS — Ht 65.0 in | Wt 230.0 lb

## 2013-03-24 DIAGNOSIS — Z860100 Personal history of colon polyps, unspecified: Secondary | ICD-10-CM

## 2013-03-24 MED ORDER — MOVIPREP 100 G PO SOLR: 1.0000 | Freq: Once | ORAL | Status: DC

## 2013-03-24 NOTE — Telephone Encounter (Signed)
FYI: Pt had TMJ repair and now has a "very limited oral opening".

## 2013-03-24 NOTE — Progress Notes (Signed)
Dr. Rayann Heman gave pt's instructions to stop Coumadin and bridge with Lovenox.  Dr. Rayann Heman is her cardiologist.

## 2013-03-25 NOTE — Telephone Encounter (Signed)
[name],  This patient DOES NOT qualify for care at [facility].  She should be scheduled in the hospital setting.  Thanks,  Jenny Reichmann

## 2013-03-29 ENCOUNTER — Telehealth: Payer: Self-pay

## 2013-03-29 NOTE — Telephone Encounter (Signed)
Per anesthesia pt does not qualify for [facility] due to being difficult intubation.

## 2013-03-29 NOTE — Telephone Encounter (Signed)
Pt scheduled to see Dr Olevia Perches in clinic on 2/4 @915 

## 2013-04-03 ENCOUNTER — Other Ambulatory Visit: Payer: 59

## 2013-04-04 ENCOUNTER — Encounter: Payer: 59 | Admitting: Internal Medicine

## 2013-04-10 ENCOUNTER — Ambulatory Visit (INDEPENDENT_AMBULATORY_CARE_PROVIDER_SITE_OTHER): Payer: 59 | Admitting: Pharmacist

## 2013-04-10 DIAGNOSIS — I4891 Unspecified atrial fibrillation: Secondary | ICD-10-CM

## 2013-04-10 DIAGNOSIS — I480 Paroxysmal atrial fibrillation: Secondary | ICD-10-CM

## 2013-04-10 LAB — POCT INR: INR: 1.7

## 2013-04-19 ENCOUNTER — Telehealth: Payer: Self-pay

## 2013-04-19 NOTE — Telephone Encounter (Addendum)
The pt c/o falling X 2 in the past 2 weeks.  At the pts last OV with Dr Rayann Heman on 03/01/13 the pt was advised to increase Losartan to 100 mg daily from 50 mg daily. She states that she did well for the first 2 weeks of taking increased dose of Losartan but for the past 2 weeks she has fallen twice. The first time she fell on her bottom and the second fall was while she was getting out of her car. The pt is taking Coumadin. She states that she does have some bruising that is healing and has started to turn a yellow color but no bleeding.   The pt is a nurse at [employer] and states that she asked one of the nurses that she works with to check her BP and it was 131/63 while sitting and 81/57 while standing. She c/o dizziness.  The pt is advised to decrease Losartan back to 50 mg daily and that this message is being sent to Dr Rayann Heman and Claiborne Billings, his nurse, to review and advise.

## 2013-04-21 NOTE — Telephone Encounter (Signed)
Please schedule to see [name] at next available time.

## 2013-04-26 ENCOUNTER — Encounter: Payer: Self-pay | Admitting: Cardiology

## 2013-04-26 ENCOUNTER — Ambulatory Visit (INDEPENDENT_AMBULATORY_CARE_PROVIDER_SITE_OTHER): Payer: 59 | Admitting: Internal Medicine

## 2013-04-26 ENCOUNTER — Ambulatory Visit (INDEPENDENT_AMBULATORY_CARE_PROVIDER_SITE_OTHER): Payer: 59 | Admitting: Cardiology

## 2013-04-26 ENCOUNTER — Ambulatory Visit (INDEPENDENT_AMBULATORY_CARE_PROVIDER_SITE_OTHER): Payer: 59 | Admitting: *Deleted

## 2013-04-26 ENCOUNTER — Encounter: Payer: Self-pay | Admitting: Internal Medicine

## 2013-04-26 VITALS — BP 110/62 | HR 80 | Ht 64.25 in | Wt 241.1 lb

## 2013-04-26 VITALS — BP 110/66 | HR 80 | Ht 64.25 in | Wt 252.0 lb

## 2013-04-26 DIAGNOSIS — Z5181 Encounter for therapeutic drug level monitoring: Secondary | ICD-10-CM

## 2013-04-26 DIAGNOSIS — Z8371 Family history of colonic polyps: Secondary | ICD-10-CM

## 2013-04-26 DIAGNOSIS — Z01810 Encounter for preprocedural cardiovascular examination: Secondary | ICD-10-CM | POA: Insufficient documentation

## 2013-04-26 DIAGNOSIS — I4891 Unspecified atrial fibrillation: Secondary | ICD-10-CM

## 2013-04-26 DIAGNOSIS — Z86010 Personal history of colonic polyps: Secondary | ICD-10-CM

## 2013-04-26 DIAGNOSIS — D689 Coagulation defect, unspecified: Secondary | ICD-10-CM

## 2013-04-26 DIAGNOSIS — I480 Paroxysmal atrial fibrillation: Secondary | ICD-10-CM

## 2013-04-26 DIAGNOSIS — R42 Dizziness and giddiness: Secondary | ICD-10-CM

## 2013-04-26 DIAGNOSIS — Z1211 Encounter for screening for malignant neoplasm of colon: Secondary | ICD-10-CM

## 2013-04-26 LAB — POCT INR: INR: 2.3

## 2013-04-26 MED ORDER — LOSARTAN POTASSIUM 25 MG PO TABS
25.0000 mg | ORAL_TABLET | Freq: Every day | ORAL | Status: DC
Start: 2013-04-26 — End: 2013-12-20

## 2013-04-26 NOTE — Progress Notes (Signed)
Patient ID: [name] MRN: [id number], DOB/AGE: 59-Nov-1957   Date of Visit: 04/26/2013  Primary Physician: Gara Kroner, MD Primary Cardiologist: Thompson Grayer, MD Reason for Visit: Dizziness  History of Present Illness  [name] is a 58 y.o. female with PAF who presents today for electrophysiology follow-up with concerns regarding dizziness. She reports intermittent dizziness with standing x 2 weeks. Her symptoms only occur with postural changes. She denies syncope. She denies CP or SOB. She has intermittent "fluttering" palpitations but these are stable, not worsening. She denies LE swelling, orthopnea or PND. She is compliant and tolerating medications without difficulty.  Past Medical History Past Medical History  Diagnosis Date  . Allergic rhinitis   . Hypercholesteremia   . GERD (gastroesophageal reflux disease)   . Asthma   . PAF (paroxysmal atrial fibrillation)     a. s/p afib ablation at [facility] 2010 (Dr. Annabell Howells). b. Recurrent AF s/p DCCV 06/2010.   Marland Kitchen Rheumatoid arthritis(714.0)   . OSA (obstructive sleep apnea)     a. Mild, did not tolerate OSA.  . Obesity   . Atrial flutter     a. Remotely per notes.  . Wandering (atrial) pacemaker     a. Remotely per notes.  Marland Kitchen DVT (deep venous thrombosis)     a. 1980s x2 - between birth of two sons. Saw hematology - was told she has a hypercoagulable disorder, should be on Coumadin lifelong.  . Halothane adverse reaction     narrow small opening  . PONV (postoperative nausea and vomiting)   . Colon polyps     Past Surgical History Past Surgical History  Procedure Laterality Date  . Cesarean section    . Carpal tunnel release Bilateral   . Inguinal hernia repair Right   . Cholecystectomy    . Tmj arthroplasty    . Afib ablation  2010    at [facility]    Allergies/Intolerances Allergies  Allergen Reactions  . Ivp Dye [Iodinated Diagnostic Agents] Anaphylaxis    Required epinephrine. Since then, has  tolerated with premed.  Marland Kitchen Penicillins Anaphylaxis  . Sulfonamide Derivatives Anaphylaxis  . Aspirin     Makes asthma worse, makes stomach hurt  . Latex     Wheezing/cough   . Meperidine Hcl     Projectile vomiting   . Statins     Severe leg pain - has tried most of them except the newest one    Current Home Medications Current Outpatient Prescriptions  Medication Sig Dispense Refill  . ASMANEX 60 METERED DOSES 220 MCG/INH inhaler Inhale 1 puff into the lungs 2 (two) times daily.       . calcium-vitamin D 250-100 MG-UNIT per tablet Take 1 tablet by mouth 2 (two) times daily.        . cetirizine (ZYRTEC) 10 MG tablet Take 10 mg by mouth daily.        . Cholecalciferol (VITAMIN D3) 1000 UNITS CAPS Take 1 capsule by mouth daily.       Marland Kitchen enoxaparin (LOVENOX) 120 MG/0.8ML injection Inject 110 mg SQ every 12 hours as directed.  20 Syringe  0  . flecainide (TAMBOCOR) 100 MG tablet Take 1 tablet (100 mg total) by mouth 2 (two) times daily.  60 tablet  5  . fluticasone (FLONASE) 50 MCG/ACT nasal spray Place 1 spray into the nose daily.       Marland Kitchen losartan (COZAAR) 100 MG tablet Take 50 mg by mouth daily.      Marland Kitchen  Multiple Vitamin (MULTIVITAMIN) tablet Take 1 tablet by mouth daily.        Marland Kitchen omeprazole (PRILOSEC) 40 MG capsule Take 40 mg by mouth daily.       Marland Kitchen PROAIR HFA 108 (90 BASE) MCG/ACT inhaler Inhale 2 puffs into the lungs every 6 (six) hours as needed for wheezing or shortness of breath.       . venlafaxine XR (EFFEXOR-XR) 37.5 MG 24 hr capsule Take 37.5 mg by mouth every morning.      . verapamil (CALAN) 80 MG tablet Take 2 tablets by mouth 2 (two) times daily.       Marland Kitchen warfarin (COUMADIN) 2 MG tablet Take as directed by the coumadin clinic       No current facility-administered medications for this visit.    Social History History   Social History  . Marital Status: Married    Spouse Name: N/A    Number of Children: 2  . Years of Education: N/A   Occupational History  . RN     Social History Main Topics  . Smoking status: Never Smoker   . Smokeless tobacco: Never Used  . Alcohol Use: Yes     Comment: occasional beer - 1x/month  . Drug Use: No  . Sexual Activity: No   Other Topics Concern  . Not on file   Social History Narrative  . No narrative on file     Review of Systems General: No chills, fever, night sweats or weight changes Cardiovascular: No chest pain, dyspnea on exertion, edema, orthopnea, palpitations, paroxysmal nocturnal dyspnea Dermatological: No rash, lesions or masses Respiratory: No cough, dyspnea Urologic: No hematuria, dysuria Abdominal: No nausea, vomiting, diarrhea, bright red blood per rectum, melena, or hematemesis Neurologic: No visual changes, weakness, changes in mental status All other systems reviewed and are otherwise negative except as noted above.  Physical Exam Vitals: Blood pressure 110/66, pulse 80, height 5' 4.25" (1.632 m), weight 252 lb (114.306 kg).  General: Well developed, well appearing 58 y.o. female in no acute distress. HEENT: Normocephalic, atraumatic. EOMs intact. Sclera nonicteric. Oropharynx clear.  Neck: Supple. No JVD. Lungs: Respirations regular and unlabored, CTA bilaterally. No wheezes, rales or rhonchi. Heart: RRR. S1, S2 present. No murmurs, rub, S3 or S4. Abdomen: Soft, non-distended.  Extremities: No clubbing, cyanosis or edema. PT/Radials 2+ and equal bilaterally. Psych: Normal affect. Neuro: Alert and oriented X 3. Moves all extremities spontaneously.   Diagnostics Echocardiogram Oct 2014 Study Conclusions - Left ventricle: The cavity size was normal. Wall thickness was increased in a pattern of mild LVH. Systolic function was normal. The estimated ejection fraction was in the range of 60% to 65%. Wall motion was normal; there were no regional wall motion abnormalities. Doppler parameters are consistent with abnormal left ventricular relaxation (grade 1 diastolic dysfunction). -  Left atrium: The atrium was mildly dilated. - Atrial septum: No defect or patent foramen ovale was identified. - Pericardium, extracardiac: A trivial pericardial effusion was identified posterior to the heart.  12-lead ECG today - NSR at 64 bpm; PR 196, QRS 104, QT/QTc 454/468 Orthostatics    BP lying 112/70 pulse 64  BP sitting 108/62 pulse 68 (dizzy)  BP standing at 0 min - 104/62 pulse 72 (dizzy)                2 min - 106/74 pulse 80     5 min - 108/76 pulse 76 (dizziness resolved)  Assessment and Plan  1. Dizziness - postural and  most likely related to orthostatic intolerance - will check CBC, BMET and Mg - decrease Losartan to 25 mg once daily - return in one week for reassessment of BP and orthostatics  2. PAF - maintaining SR on flecainide - continue current regimen including warfarin for stroke prevention - no changes made  Signed, Shajuan Musso, PA-C 04/26/2013, 4:40 PM

## 2013-04-26 NOTE — Patient Instructions (Signed)
Your physician recommends that you schedule a follow-up appointment in: [place]  Your physician recommends that you schedule a follow-up appointment in: WITH DR Rayann Heman AS PREVIOUSLY SET  DECREASE LOSARTAN TO 25 MG ONCE DAILY  Your physician recommends that you HAVE LAB WORK TODAY

## 2013-04-26 NOTE — Patient Instructions (Addendum)
You have been scheduled for a colonoscopy with propofol. Please follow written instructions given to you at your visit today.  Please pick up your prep kit at the pharmacy within the next 1-3 days. If you use inhalers (even only as needed), please bring them with you on the day of your procedure.  Please follow lovenox bridge instructions given to you by Dr Allred for your procedure.  CC: Dr David Swayne, Dr James Allred, John Nolte CRNA 

## 2013-04-26 NOTE — Progress Notes (Signed)
Kashari Chalmers [facility] [birth year] [id number]  Note: This dictation was prepared with Dragon digital system. Any transcriptional errors that result from this procedure are unintentional.   History of Present Illness:  This is a 58 year old white female who is here to discuss having a screening colonoscopy. She has been on lifelong Coumadin for atrial fibrillation and hypercoagulable state. She has a history of DVT. Dr Rayann Heman recommended she be bridged with Lovenox for endoscopic  procedures. She had 3 prior colonoscopies in [place] starting at age 38, again at age 15 and 42. She is due for a recall colonoscopy at this time. There is a family history of colon polyps in her father and a personal history of colon polyps during her first colonoscopy. She never had any problems with propofol or conscious sedation. Her chart indicates that she had difficult intubation, but when I asked patient about it, she said that she had TMJ surgery and that her mouth aperture is slightly smaller but that she never had problems with intubation including the time she had a laparoscopic cholecystectomy. All prior colonoscopies were done in an outpatient endoscopy center.   Allergies  Allergen Reactions  . Ivp Dye [Iodinated Diagnostic Agents] Anaphylaxis    Required epinephrine. Since then, has tolerated with premed.  Marland Kitchen Penicillins Anaphylaxis  . Sulfonamide Derivatives Anaphylaxis  . Aspirin     Makes asthma worse, makes stomach hurt  . Latex     Wheezing/cough   . Meperidine Hcl     Projectile vomiting   . Statins     Severe leg pain - has tried most of them except the newest one   her colon is copious were done on outpatient in endoscopy centers    Past Medical History  Diagnosis Date  . Allergic rhinitis   . Hypercholesteremia   . GERD (gastroesophageal reflux disease)   . Asthma   . PAF (paroxysmal atrial fibrillation)     a. s/p afib ablation at [facility] 2010 (Dr. Annabell Howells). b. Recurrent AF s/p  DCCV 06/2010.   Marland Kitchen Rheumatoid arthritis(714.0)   . OSA (obstructive sleep apnea)     a. Mild, did not tolerate OSA.  . Obesity   . Atrial flutter     a. Remotely per notes.  . Wandering (atrial) pacemaker     a. Remotely per notes.  Marland Kitchen DVT (deep venous thrombosis)     a. 1980s x2 - between birth of two sons. Saw hematology - was told she has a hypercoagulable disorder, should be on Coumadin lifelong.  . Halothane adverse reaction     narrow small opening  . PONV (postoperative nausea and vomiting)   . Colon polyps     Past Surgical History  Procedure Laterality Date  . Cesarean section    . Carpal tunnel release Bilateral   . Inguinal hernia repair Right   . Cholecystectomy    . Tmj arthroplasty    . Afib ablation  2010    at [place] Reactions  . Ivp Dye [Iodinated Diagnostic Agents] Anaphylaxis    Required epinephrine. Since then, has tolerated with premed.  Marland Kitchen Penicillins Anaphylaxis  . Sulfonamide Derivatives Anaphylaxis  . Aspirin     Makes asthma worse, makes stomach hurt  . Latex     Wheezing/cough   . Meperidine Hcl     Projectile vomiting   . Statins     Severe leg pain - has tried most of them except the newest one  Family history and social history have been reviewed.  Review of Systems: Mild peripheral fluid retention. Denies heartburn denies abdominal pain or rectal bleeding  The remainder of the 10 point ROS is negative except as outlined in the H&P  Physical Exam: General Appearance Well developed, in no distress, overweight Eyes  Non icteric  HEENT  Non traumatic, normocephalic  Mouth No lesion, tongue papillated, no cheilosis Neck Supple without adenopathy, thyroid not enlarged, no carotid bruits, no JVD Lungs Clear to auscultation bilaterally COR Normal S1, normal S2, regular rhythm, no murmur, quiet precordium Abdomen protuberant soft nontender with normal active bowel sounds Rectal not done Extremities  trace   pedal edema Skin No lesions Neurological Alert and oriented x 3 Psychological Normal mood and affect  Assessment and Plan:   Problem #1 Patient is due for recall colonoscopy because of her family history of colon polyps as well as  personal history of colon polyps. She is on lifelong anticoagulation with Coumadin and will be bridged for her colonoscopy with Lovenox. There was a misunderstanding in labeling the patient as a difficult intubation because, in fact, she has had no problems with intubation during her cholecystectomy but had prior TMJ surgery which made her mouth somewhat smaller. She had no problems with prior propofol sedation in an outpatient setting. We will go ahead and schedule her for a colonoscopy. I have discussed the prep, sedation and the procedure with her. We will hold Coumadin 5 days prior to the procedure and she will bridge with Lovenox as per Dr Jackalyn Lombard recent  recommendations.    Delfin Edis 04/26/2013

## 2013-04-27 ENCOUNTER — Other Ambulatory Visit: Payer: Self-pay | Admitting: Internal Medicine

## 2013-04-27 LAB — BASIC METABOLIC PANEL
BUN: 21 mg/dL (ref 6–23)
CHLORIDE: 107 meq/L (ref 96–112)
CO2: 25 mEq/L (ref 19–32)
Calcium: 9.7 mg/dL (ref 8.4–10.5)
Creatinine, Ser: 0.8 mg/dL (ref 0.4–1.2)
GFR: 76.93 mL/min (ref >60.00)
GLUCOSE: 106 mg/dL — AB (ref 70–99)
Potassium: 3.7 mEq/L (ref 3.5–5.1)
Sodium: 142 mEq/L (ref 135–145)

## 2013-04-27 LAB — CBC WITH DIFFERENTIAL/PLATELET
BASOS PCT: 0.4 % (ref 0.0–3.0)
Basophils Absolute: 0 10*3/uL (ref 0.0–0.1)
EOS PCT: 1.2 % (ref 0.0–5.0)
Eosinophils Absolute: 0.1 10*3/uL (ref 0.0–0.7)
HCT: 38.7 % (ref 36.0–46.0)
HEMOGLOBIN: 12.9 g/dL (ref 12.0–15.0)
LYMPHS PCT: 40.2 % (ref 12.0–46.0)
Lymphs Abs: 2.8 10*3/uL (ref 0.7–4.0)
MCHC: 33.2 g/dL (ref 30.0–36.0)
MCV: 91.6 fl (ref 78.0–100.0)
MONO ABS: 0.4 10*3/uL (ref 0.1–1.0)
Monocytes Relative: 6.2 % (ref 3.0–12.0)
Neutro Abs: 3.7 10*3/uL (ref 1.4–7.7)
Neutrophils Relative %: 52 % (ref 43.0–77.0)
Platelets: 350 10*3/uL (ref 150.0–400.0)
RBC: 4.22 Mil/uL (ref 3.87–5.11)
RDW: 13.5 % (ref 11.5–14.6)
WBC: 7 10*3/uL (ref 4.5–10.5)

## 2013-04-27 LAB — MAGNESIUM: Magnesium: 1.8 mg/dL (ref 1.5–2.5)

## 2013-04-28 ENCOUNTER — Telehealth: Payer: Self-pay | Admitting: *Deleted

## 2013-04-28 NOTE — Telephone Encounter (Signed)
lmtcb for lab results. Number provided 

## 2013-05-04 ENCOUNTER — Ambulatory Visit (INDEPENDENT_AMBULATORY_CARE_PROVIDER_SITE_OTHER): Payer: 59 | Admitting: *Deleted

## 2013-05-04 VITALS — BP 136/81 | HR 67 | Wt 242.8 lb

## 2013-05-04 DIAGNOSIS — R42 Dizziness and giddiness: Secondary | ICD-10-CM

## 2013-05-04 DIAGNOSIS — I10 Essential (primary) hypertension: Secondary | ICD-10-CM

## 2013-05-04 NOTE — Progress Notes (Signed)
Patient returns today for orthostatic blood pressures.  States that today she is symptomatic with changing positions.  Sees  "white spots" and feels "as if blood is rushing from her head" and dizzy.  This is most noticeable when she goes from sitting to standing.  She has reduced her losartan to 25 mg daily as ordered in last office visit.  Please see extended vs for the BP readings.  With each position change patient experienced the symptoms.  Informed her I will provide Dr. Rayann Heman and Jerene Pitch this information and that someone will be in touch with her.  She is due for 6 month f/u with Dr. Rayann Heman in June.

## 2013-05-24 ENCOUNTER — Ambulatory Visit (INDEPENDENT_AMBULATORY_CARE_PROVIDER_SITE_OTHER): Payer: 59 | Admitting: Cardiology

## 2013-05-24 ENCOUNTER — Encounter: Payer: Self-pay | Admitting: Cardiology

## 2013-05-24 ENCOUNTER — Ambulatory Visit (INDEPENDENT_AMBULATORY_CARE_PROVIDER_SITE_OTHER): Payer: 59 | Admitting: *Deleted

## 2013-05-24 VITALS — BP 140/84 | HR 79 | Ht 64.25 in | Wt 243.0 lb

## 2013-05-24 DIAGNOSIS — I480 Paroxysmal atrial fibrillation: Secondary | ICD-10-CM

## 2013-05-24 DIAGNOSIS — I4891 Unspecified atrial fibrillation: Secondary | ICD-10-CM

## 2013-05-24 DIAGNOSIS — Z5181 Encounter for therapeutic drug level monitoring: Secondary | ICD-10-CM

## 2013-05-24 DIAGNOSIS — I951 Orthostatic hypotension: Secondary | ICD-10-CM

## 2013-05-24 LAB — POCT INR: INR: 2.1

## 2013-05-24 NOTE — Patient Instructions (Signed)
06/22/2013 last day to take coumadin 06/23/2013 no coumadin no Lovenox 06/24/2013 no coumadin Lovenox 120mg  8am and Lovenox 120mg  8pm 06/25/2013 no coumadin Lovenox 120mg  8am and Lovenox 120mg  8pm 06/26/2013 no coumadin Lovenox 120mg  8am and Lovenox 120mg  8pm 06/27/2013 no coumadin Lovenox 120mg   8am only  06/28/2013 no coumadin no lovenox day of procedure then when MD doing procedure states to resume    Resume Lovenox 120mg  8am and Lovenox 120mg  8pm and coumadin at same dose except for coumadin take extra 1/2 tablet for 2 days and continue Lovenox and coumadin until seen in clinic on April 13th

## 2013-05-24 NOTE — Progress Notes (Addendum)
ELECTROPHYSIOLOGY OFFICE NOTE   Patient ID: LAYA [name] MRN: [id number], DOB/AGE: Apr 11, 1955   Date of Visit: 05/24/2013  Primary Physician: Gara Kroner, MD Primary Cardiologist: Thompson Grayer, MD Reason for Visit: Orthostatic hypotension  History of Present Illness  [name] is a pleasant 58 year old woman with PAF who presents today for electrophysiology follow-up after recent medication changes. Losartan was down-titrated due to orthostatic hypotension. Since that change, she reports only one episode of dizziness with standing. Her symptoms have only occurred with postural changes. She denies syncope. She denies CP or SOB. She has intermittent "fluttering" palpitations but these are stable, not worsening from baseline and she has been maintained on flecainide.   Past Medical History Past Medical History  Diagnosis Date  . Allergic rhinitis   . Hypercholesteremia   . GERD (gastroesophageal reflux disease)   . Asthma   . PAF (paroxysmal atrial fibrillation)     a. s/p afib ablation at [facility] 2010 (Dr. Annabell Howells). b. Recurrent AF s/p DCCV 06/2010.   Marland Kitchen Rheumatoid arthritis(714.0)   . OSA (obstructive sleep apnea)     a. Mild, did not tolerate OSA.  . Obesity   . Atrial flutter     a. Remotely per notes.  . Wandering (atrial) pacemaker     a. Remotely per notes.  Marland Kitchen DVT (deep venous thrombosis)     a. 1980s x2 - between birth of two sons. Saw hematology - was told she has a hypercoagulable disorder, should be on Coumadin lifelong.  . Halothane adverse reaction     narrow small opening  . PONV (postoperative nausea and vomiting)   . Colon polyps     Past Surgical History Past Surgical History  Procedure Laterality Date  . Cesarean section    . Carpal tunnel release Bilateral   . Inguinal hernia repair Right   . Cholecystectomy    . Tmj arthroplasty    . Afib ablation  2010    at [facility]    Allergies/Intolerances Allergies  Allergen Reactions  .  Ivp Dye [Iodinated Diagnostic Agents] Anaphylaxis    Required epinephrine. Since then, has tolerated with premed.  Marland Kitchen Penicillins Anaphylaxis  . Sulfonamide Derivatives Anaphylaxis  . Aspirin     Makes asthma worse, makes stomach hurt  . Latex     Wheezing/cough   . Meperidine Hcl     Projectile vomiting   . Statins     Severe leg pain - has tried most of them except the newest one    Current Home Medications Current Outpatient Prescriptions  Medication Sig Dispense Refill  . ASMANEX 60 METERED DOSES 220 MCG/INH inhaler Inhale 1 puff into the lungs 2 (two) times daily.       . calcium-vitamin D 250-100 MG-UNIT per tablet Take 1 tablet by mouth 2 (two) times daily.        . cetirizine (ZYRTEC) 10 MG tablet Take 10 mg by mouth daily.        . Cholecalciferol (VITAMIN D3) 1000 UNITS CAPS Take 1 capsule by mouth daily.       Marland Kitchen enoxaparin (LOVENOX) 120 MG/0.8ML injection Inject 110 mg SQ every 12 hours as directed.  20 Syringe  0  . flecainide (TAMBOCOR) 100 MG tablet Take 1 tablet (100 mg total) by mouth 2 (two) times daily.  60 tablet  5  . fluticasone (FLONASE) 50 MCG/ACT nasal spray Place 1 spray into the nose 2 (two) times daily.       Marland Kitchen  losartan (COZAAR) 25 MG tablet Take 1 tablet (25 mg total) by mouth daily.  30 tablet  12  . Multiple Vitamin (MULTIVITAMIN) tablet Take 1 tablet by mouth daily.        Marland Kitchen omeprazole (PRILOSEC) 40 MG capsule Take 40 mg by mouth daily.       Marland Kitchen PROAIR HFA 108 (90 BASE) MCG/ACT inhaler Inhale 2 puffs into the lungs every 6 (six) hours as needed for wheezing or shortness of breath.       . venlafaxine XR (EFFEXOR-XR) 37.5 MG 24 hr capsule Take 37.5 mg by mouth every morning.      . verapamil (CALAN) 80 MG tablet Take 2 tablets by mouth 2 (two) times daily.       Marland Kitchen warfarin (COUMADIN) 2 MG tablet Take as directed by the coumadin clinic       No current facility-administered medications for this visit.    Social History History   Social History  .  Marital Status: Married    Spouse Name: N/A    Number of Children: 2  . Years of Education: N/A   Occupational History  . RN    Social History Main Topics  . Smoking status: Never Smoker   . Smokeless tobacco: Never Used  . Alcohol Use: Yes     Comment: occasional beer - 1x/month  . Drug Use: No  . Sexual Activity: No   Other Topics Concern  . Not on file   Social History Narrative  . No narrative on file     Review of Systems General: No chills, fever, night sweats or weight changes Cardiovascular: No chest pain, dyspnea on exertion, edema, orthopnea, palpitations, paroxysmal nocturnal dyspnea Dermatological: No rash, lesions or masses Respiratory: No cough, dyspnea Urologic: No hematuria, dysuria Abdominal: No nausea, vomiting, diarrhea, bright red blood per rectum, melena, or hematemesis Neurologic: No visual changes, weakness, changes in mental status All other systems reviewed and are otherwise negative except as noted above.  Physical Exam Vitals: Blood pressure 140/84, pulse 79, height 5' 4.25" (1.632 m), weight 243 lb (110.224 kg), SpO2 93.00%.  General: Well developed, well appearing 58 y.o. female in no acute distress. HEENT: Normocephalic, atraumatic. EOMs intact. Sclera nonicteric. Oropharynx clear.  Neck: Supple. No JVD. Lungs: Respirations regular and unlabored, CTA bilaterally. No wheezes, rales or rhonchi. Heart: RRR. S1, S2 present. No murmurs, rub, S3 or S4. Abdomen: Appears soft, non-distended.  Extremities: No clubbing, cyanosis or edema.  Psych: Normal affect. Neuro: Alert and oriented X 3. Moves all extremities spontaneously.   Diagnostics Echocardiogram Oct 2014  Study Conclusions - Left ventricle: The cavity size was normal. Wall thickness was increased in a pattern of mild LVH. Systolic function was normal. The estimated ejection fraction was in the range of 60% to 65%. Wall motion was normal; there were no regional wall motion  abnormalities. Doppler parameters are consistent with abnormal left ventricular relaxation (grade 1 diastolic dysfunction). - Left atrium: The atrium was mildly dilated. - Atrial septum: No defect or patent foramen ovale was identified. - Pericardium, extracardiac: A trivial pericardial effusion was identified posterior to the heart. Orthostatic Vitals today  Lying BP 148/83 pulse 71  Sitting BP 135/80 pulse 71  Standing - 0 min BP 130/80 pulse 78       - 2 min 129/83 pulse 79       - 5 min 141/80 pulse 85     Assessment and Plan  1. Orthostatic hypotension - improved on lower dose  of losartan   2. PAF  - maintaining SR on flecainide  - continue current regimen including warfarin for stroke prevention  - no changes made - follow-up with Dr. Rayann Heman as scheduled in 4 months  Signed, Ileene Hutchinson, PA-C 05/24/2013, 3:23 PM

## 2013-05-24 NOTE — Patient Instructions (Signed)
Your physician wants you to follow-up in: [place] will receive a reminder letter in the mail two months in advance. If you don't receive a letter, please call our office to schedule the follow-up appointment.

## 2013-06-28 ENCOUNTER — Other Ambulatory Visit: Payer: Self-pay | Admitting: Internal Medicine

## 2013-06-28 ENCOUNTER — Ambulatory Visit (AMBULATORY_SURGERY_CENTER): Payer: 59 | Admitting: Internal Medicine

## 2013-06-28 ENCOUNTER — Other Ambulatory Visit: Payer: Self-pay | Admitting: *Deleted

## 2013-06-28 ENCOUNTER — Encounter: Payer: Self-pay | Admitting: Internal Medicine

## 2013-06-28 VITALS — BP 144/80 | HR 51 | Temp 97.0°F | Resp 15 | Ht 64.25 in | Wt 241.0 lb

## 2013-06-28 DIAGNOSIS — Z86010 Personal history of colonic polyps: Secondary | ICD-10-CM

## 2013-06-28 DIAGNOSIS — D126 Benign neoplasm of colon, unspecified: Secondary | ICD-10-CM

## 2013-06-28 DIAGNOSIS — D129 Benign neoplasm of anus and anal canal: Secondary | ICD-10-CM

## 2013-06-28 DIAGNOSIS — D128 Benign neoplasm of rectum: Secondary | ICD-10-CM

## 2013-06-28 MED ORDER — SODIUM CHLORIDE 0.9 % IV SOLN: 500.0000 mL | INTRAVENOUS | Status: DC

## 2013-06-28 NOTE — Progress Notes (Signed)
Called to room to assist during endoscopic procedure.  Patient ID and intended procedure confirmed with present staff. Received instructions for my participation in the procedure from the performing physician.  

## 2013-06-28 NOTE — Progress Notes (Signed)
Pt c/o light sedation, pt states she did not go to sleep and that she felt everything and heard everything, reported to Osvaldo Angst CRNA who gave sedation, Jenny Reichmann stated that pt went to sleep but woke up very quickly towards the end of the procedure.-adm

## 2013-06-28 NOTE — Patient Instructions (Signed)

## 2013-06-28 NOTE — Op Note (Signed)
[facility]  [address] [place], [id number]   COLONOSCOPY PROCEDURE REPORT  PATIENT: [name], [name]  MR#: [id number] BIRTHDATE: 1955/08/09 , 58  yrs. old GENDER: Female ENDOSCOPIST: Lafayette Dragon, MD REFERRED BY:Dr Antony Contras PROCEDURE DATE:  06/28/2013 PROCEDURE:   Colonoscopy with cold biopsy polypectomy First Screening Colonoscopy - Avg.  risk and is 50 yrs.  old or older - No.  Prior Negative Screening - Now for repeat screening. N/A  History of Adenoma - Now for follow-up colonoscopy & has been > or = to 3 yrs.  Yes hx of adenoma.  Has been 3 or more years since last colonoscopy.  Polyps Removed Today? Yes. ASA CLASS:   Class II INDICATIONS:last colonoscopy at age 1 in [place].  History of polyps.  Positive family history of colon polyps.  Patient has been on Lovenox bridge. MEDICATIONS: MAC sedation, administered by CRNA, Propofol (Diprivan), and Propofol (Diprivan) 280 mg IV  DESCRIPTION OF PROCEDURE:   After the risks benefits and alternatives of the procedure were thoroughly explained, informed consent was obtained.  A digital rectal exam revealed no abnormalities of the rectum.   The LB [id number]  endoscope was introduced through the anus and advanced to the cecum, which was identified by the ileocecal valve. No adverse events experienced.   The quality of the prep was good, using MoviPrep The instrument was then slowly withdrawn as the colon was fully examined.      COLON FINDINGS: A smooth sessile polyp ranging between 3-55mm in size was found in the rectum.  A polypectomy was performed with cold forceps.  The resection was complete and the polyp tissue was completely retrieved.   There was moderate diverticulosis noted in the sigmoid colon with associated muscular hypertrophy. Retroflexed views revealed no abnormalities. The time to cecum=10 minutes 10 seconds.  Withdrawal time=10 minutes 10 seconds.  The scope was withdrawn  and the procedure completed. COMPLICATIONS: There were no complications.  ENDOSCOPIC IMPRESSION: 1.   Sessile polyp ranging between 3-74mm in size was found in the rectum; polypectomy was performed with cold forceps 2.   There was moderate diverticulosis noted in the sigmoid colon  RECOMMENDATIONS: 1.  Await pathology results 2.  high fiber diet Resume Lovenox and Coumadin tonight Recall colonoscopy pending path report   eSigned:  Lafayette Dragon, MD 06/28/2013 8:43 AM   cc:   PATIENT NAME:  [name], [name] MR#: [id number]

## 2013-06-28 NOTE — Progress Notes (Signed)
A/ox3 pleased with MAC, report to April RN 

## 2013-06-29 ENCOUNTER — Telehealth: Payer: Self-pay | Admitting: *Deleted

## 2013-06-29 NOTE — Telephone Encounter (Signed)
  Follow up Call-  Call back number 06/28/2013  Post procedure Call Back phone  # [phone]  Permission to leave phone message Yes     Patient questions:  Do you have a fever, pain , or abdominal swelling? no Pain Score  0 *  Have you tolerated food without any problems? yes  Have you been able to return to your normal activities? yes  Do you have any questions about your discharge instructions: Diet   no Medications  no Follow up visit  no  Do you have questions or concerns about your Care? no  Actions: * If pain score is 4 or above: No action needed, pain <4.

## 2013-07-03 ENCOUNTER — Ambulatory Visit (INDEPENDENT_AMBULATORY_CARE_PROVIDER_SITE_OTHER): Payer: 59 | Admitting: *Deleted

## 2013-07-03 ENCOUNTER — Encounter: Payer: Self-pay | Admitting: Internal Medicine

## 2013-07-03 DIAGNOSIS — I4891 Unspecified atrial fibrillation: Secondary | ICD-10-CM

## 2013-07-03 DIAGNOSIS — I480 Paroxysmal atrial fibrillation: Secondary | ICD-10-CM

## 2013-07-03 DIAGNOSIS — Z5181 Encounter for therapeutic drug level monitoring: Secondary | ICD-10-CM

## 2013-07-03 LAB — POCT INR: INR: 1.7

## 2013-07-04 ENCOUNTER — Encounter: Payer: Self-pay | Admitting: *Deleted

## 2013-07-07 ENCOUNTER — Ambulatory Visit (INDEPENDENT_AMBULATORY_CARE_PROVIDER_SITE_OTHER): Payer: 59 | Admitting: Pharmacist

## 2013-07-07 DIAGNOSIS — Z5181 Encounter for therapeutic drug level monitoring: Secondary | ICD-10-CM

## 2013-07-07 DIAGNOSIS — I4891 Unspecified atrial fibrillation: Secondary | ICD-10-CM

## 2013-07-07 DIAGNOSIS — I480 Paroxysmal atrial fibrillation: Secondary | ICD-10-CM

## 2013-07-07 LAB — POCT INR: INR: 2.2

## 2013-07-17 ENCOUNTER — Other Ambulatory Visit: Payer: Self-pay | Admitting: Family Medicine

## 2013-07-17 ENCOUNTER — Ambulatory Visit
Admission: RE | Admit: 2013-07-17 | Discharge: 2013-07-17 | Disposition: A | Discharge status: Home / Self Care | Payer: 59 | Source: Ambulatory Visit | Attending: Family Medicine | Admitting: Family Medicine

## 2013-07-17 DIAGNOSIS — J069 Acute upper respiratory infection, unspecified: Secondary | ICD-10-CM

## 2013-07-21 ENCOUNTER — Ambulatory Visit (INDEPENDENT_AMBULATORY_CARE_PROVIDER_SITE_OTHER): Payer: 59

## 2013-07-21 DIAGNOSIS — I4891 Unspecified atrial fibrillation: Secondary | ICD-10-CM

## 2013-07-21 DIAGNOSIS — I480 Paroxysmal atrial fibrillation: Secondary | ICD-10-CM

## 2013-07-21 DIAGNOSIS — Z5181 Encounter for therapeutic drug level monitoring: Secondary | ICD-10-CM

## 2013-07-21 LAB — POCT INR: INR: 2.7

## 2013-08-04 ENCOUNTER — Other Ambulatory Visit: Payer: Self-pay | Admitting: Internal Medicine

## 2013-08-07 ENCOUNTER — Other Ambulatory Visit: Payer: Self-pay | Admitting: Internal Medicine

## 2013-08-08 ENCOUNTER — Ambulatory Visit (INDEPENDENT_AMBULATORY_CARE_PROVIDER_SITE_OTHER): Payer: 59 | Admitting: Pharmacist

## 2013-08-08 DIAGNOSIS — I480 Paroxysmal atrial fibrillation: Secondary | ICD-10-CM

## 2013-08-08 DIAGNOSIS — I4891 Unspecified atrial fibrillation: Secondary | ICD-10-CM

## 2013-08-08 DIAGNOSIS — Z5181 Encounter for therapeutic drug level monitoring: Secondary | ICD-10-CM

## 2013-08-08 LAB — POCT INR: INR: 2.6

## 2013-09-05 ENCOUNTER — Ambulatory Visit (INDEPENDENT_AMBULATORY_CARE_PROVIDER_SITE_OTHER): Payer: 59 | Admitting: Pharmacist

## 2013-09-05 DIAGNOSIS — I480 Paroxysmal atrial fibrillation: Secondary | ICD-10-CM

## 2013-09-05 DIAGNOSIS — I4891 Unspecified atrial fibrillation: Secondary | ICD-10-CM

## 2013-09-05 DIAGNOSIS — Z5181 Encounter for therapeutic drug level monitoring: Secondary | ICD-10-CM

## 2013-09-05 LAB — POCT INR: INR: 1.9

## 2013-09-13 ENCOUNTER — Encounter: Payer: Self-pay | Admitting: Internal Medicine

## 2013-09-19 ENCOUNTER — Other Ambulatory Visit: Payer: Self-pay | Admitting: Obstetrics and Gynecology

## 2013-09-19 DIAGNOSIS — N6320 Unspecified lump in the left breast, unspecified quadrant: Secondary | ICD-10-CM

## 2013-09-27 ENCOUNTER — Ambulatory Visit (INDEPENDENT_AMBULATORY_CARE_PROVIDER_SITE_OTHER): Payer: 59

## 2013-09-27 VITALS — BP 130/72 | HR 70 | Resp 17

## 2013-09-27 DIAGNOSIS — M79671 Pain in right foot: Secondary | ICD-10-CM

## 2013-09-27 DIAGNOSIS — M79609 Pain in unspecified limb: Secondary | ICD-10-CM

## 2013-09-27 DIAGNOSIS — M79672 Pain in left foot: Principal | ICD-10-CM

## 2013-09-27 DIAGNOSIS — M722 Plantar fascial fibromatosis: Secondary | ICD-10-CM

## 2013-09-27 MED ORDER — NAFTIFINE HCL 2 % EX CREA: TOPICAL_CREAM | CUTANEOUS | Status: DC

## 2013-09-27 MED ORDER — TRIAMCINOLONE ACETONIDE 10 MG/ML IJ SUSP: 10.0000 mg | Freq: Once | INTRAMUSCULAR | Status: DC

## 2013-09-27 NOTE — Progress Notes (Signed)
   Subjective:    Patient ID: [name], female    DOB: 11-09-1955, 58 y.o.   MRN: [id number]  HPI   Pt states that she is having severe bilat heel pain, worse in the left heel , that radiates up her achilles on both sides, it makes it difficult for her to walk. She has had pain for 1 month, she rests and changes shoes when pain is severe. Review of Systems  Cardiovascular: Positive for leg swelling.  Genitourinary: Positive for urgency.  Musculoskeletal: Positive for arthralgias, back pain, gait problem and myalgias.  Allergic/Immunologic: Positive for food allergies and immunocompromised state.  All other systems reviewed and are negative.      Objective:   Physical Exam 58 year old white female well-developed well-nourished oriented x3 presents at this time with a complaint of bilateral heel pain. This of them had chronic recalcitrant area radiates up into the Achilles and time making difficult to walk. His pain from his last month has tried changing shoes and activities with no success. No history of acute injury or trauma noted.  Lower extremity objective findings as follows patient has pedal pulses palpable DP and PT +2/4 bilateral Refill time 3 seconds all digits epicritic and proprioceptive sensations. He intact with normal plantar response and DTRs noted dermatologically skin color pigment normal hair growth absent nails unremarkable there is mild rash or erythematous plaque medial ankle area and mid arch area on the left foot this occasionally has some pruritus and flares up periodically appears to be a small tinea the medial arch area. Remainder dermatologic the same unremarkable no open wounds or ulcerations noted orthopedic biomechanical exam reveals rectus foot type mild flexible digital contractures noted x-rays lateral and calcaneal axial views reveal mild inferior calcaneus spur on the right no spurring on the left mild fascial thickening bilateral no fractures cyst or  tumors or osseous abnormalities noted       Assessment & Plan:  Assessment this time #1 is plantar fasciitis/heel spur syndrome bilateral at this time patient was placed in fascial strapping after injection of 10 mg Kenalog 20 mg Xylocaine plain infiltrated to each heel plantar fascial insertion site and medial calcaneal tubercle bilateral patient tolerated the injections well fascial strapping is applied however the taping cause any irritation remove and contact us immediately patient has alert allergy to latex reusing a nonlatex type tape. Patient releasing in 2 weeks for followup recommended Tylenol as needed for pain also recommended ice pack to the heel every evening as tolerated.  Assessment #2 at this time is mild tinea pedis left medial foot and arch prescription for Naftin cream is issued applied twice daily to the affected area for to 4 weeks  Followup in 2 weeks for reevaluation next  Harriet Masson DPM

## 2013-09-27 NOTE — Patient Instructions (Signed)
Plantar Fasciitis Plantar fasciitis is a common condition that causes foot pain. It is soreness (inflammation) of the band of tough fibrous tissue on the bottom of the foot that runs from the heel bone (calcaneus) to the ball of the foot. The cause of this soreness may be from excessive standing, poor fitting shoes, running on hard surfaces, being overweight, having an abnormal walk, or overuse (this is common in runners) of the painful foot or feet. It is also common in aerobic exercise dancers and ballet dancers. SYMPTOMS  Most people with plantar fasciitis complain of:  Severe pain in the morning on the bottom of their foot especially when taking the first steps out of bed. This pain recedes after a few minutes of walking.  Severe pain is experienced also during walking following a long period of inactivity.  Pain is worse when walking barefoot or up stairs DIAGNOSIS   Your caregiver will diagnose this condition by examining and feeling your foot.  Special tests such as X-rays of your foot, are usually not needed. PREVENTION   Consult a sports medicine professional before beginning a new exercise program.  Walking programs offer a good workout. With walking there is a lower chance of overuse injuries common to runners. There is less impact and less jarring of the joints.  Begin all new exercise programs slowly. If problems or pain develop, decrease the amount of time or distance until you are at a comfortable level.  Wear good shoes and replace them regularly.  Stretch your foot and the heel cords at the back of the ankle (Achilles tendon) both before and after exercise.  Run or exercise on even surfaces that are not hard. For example, asphalt is better than pavement.  Do not run barefoot on hard surfaces.  If using a treadmill, vary the incline.  Do not continue to workout if you have foot or joint problems. Seek professional help if they do not improve. HOME CARE INSTRUCTIONS     Avoid activities that cause you pain until you recover.  Use ice or cold packs on the problem or painful areas after working out.  Only take over-the-counter or prescription medicines for pain, discomfort, or fever as directed by your caregiver.  Soft shoe inserts or athletic shoes with air or gel sole cushions may be helpful.  If problems continue or become more severe, consult a sports medicine caregiver or your own health care provider. Cortisone is a potent anti-inflammatory medication that may be injected into the painful area. You can discuss this treatment with your caregiver. MAKE SURE YOU:   Understand these instructions.  Will watch your condition.  Will get help right away if you are not doing well or get worse. Document Released: 12/02/2000 Document Revised: 06/01/2011 Document Reviewed: 02/01/2008 Exit[employer] Patient Information 2015 [employer]. This information is not intended to replace advice given to you by your health care provider. Make sure you discuss any questions you have with your health care provider.    ICE INSTRUCTIONS  Apply ice or cold pack to the affected area at least 3 times a day for 10-15 minutes each time.  You should also use ice after prolonged activity or vigorous exercise.  Do not apply ice longer than 20 minutes at one time.  Always keep a cloth between your skin and the ice pack to prevent burns.  Being consistent and following these instructions will help control your symptoms.  We suggest you purchase a gel ice pack because they are   reusable and do bit leak.  Some of them are designed to wrap around the area.  Use the method that works best for you.  Here are some other suggestions for icing.   Use a frozen bag of peas or corn-inexpensive and molds well to your body, usually stays frozen for 10 to 20 minutes.  Wet a towel with cold water and squeeze out the excess until it's damp.  Place in a bag in the freezer for 20 minutes. Then remove  and use. 

## 2013-10-04 ENCOUNTER — Ambulatory Visit: Payer: 59 | Admitting: Internal Medicine

## 2013-10-05 ENCOUNTER — Ambulatory Visit (INDEPENDENT_AMBULATORY_CARE_PROVIDER_SITE_OTHER): Payer: 59 | Admitting: Pharmacist

## 2013-10-05 DIAGNOSIS — I480 Paroxysmal atrial fibrillation: Secondary | ICD-10-CM

## 2013-10-05 DIAGNOSIS — Z5181 Encounter for therapeutic drug level monitoring: Secondary | ICD-10-CM

## 2013-10-05 DIAGNOSIS — I4891 Unspecified atrial fibrillation: Secondary | ICD-10-CM

## 2013-10-05 LAB — POCT INR: INR: 1.6

## 2013-10-16 ENCOUNTER — Other Ambulatory Visit: Payer: 59

## 2013-10-20 ENCOUNTER — Ambulatory Visit (INDEPENDENT_AMBULATORY_CARE_PROVIDER_SITE_OTHER): Payer: 59

## 2013-10-20 VITALS — BP 148/75 | HR 82 | Resp 16

## 2013-10-20 DIAGNOSIS — M722 Plantar fascial fibromatosis: Secondary | ICD-10-CM

## 2013-10-20 DIAGNOSIS — M79671 Pain in right foot: Secondary | ICD-10-CM

## 2013-10-20 DIAGNOSIS — M79609 Pain in unspecified limb: Secondary | ICD-10-CM

## 2013-10-20 DIAGNOSIS — M79672 Pain in left foot: Secondary | ICD-10-CM

## 2013-10-20 MED ORDER — TRIAMCINOLONE ACETONIDE 10 MG/ML IJ SUSP: 10.0000 mg | Freq: Once | INTRAMUSCULAR | Status: DC

## 2013-10-20 NOTE — Progress Notes (Signed)
   Subjective:    Patient ID: [name], female    DOB: 05-03-1955, 58 y.o.   MRN: [id number]  HPI Comments: "They are not worse but not too much better. Still hurting"  Plantar fasciitis - Follow up bilateral heels      Review of Systems no new findings or systemic changes noted    Objective:   Physical Exam Lower extremity objective findings unchanged patient has palpable pedal pulses DP postal for PT one over 4 bilateral capillary refill time 3-4 seconds. Patient has significant improvement in the right foot however the left heel still painful and symptomatic it felt better while fascial strapping was in place but has recurred tenderness on palpation medial calcaneal tubercle he has some tenderness in the Achilles to compensatory gait. No other changes in orthopedic biomechanical exam based on improvement with fascial strapping patient is a strong candidate for functional orthoses. Patient did request additional posterior injection at this time of steroid if she cannot take NSAIDs due to her Coumadin anticoagulant history therefore injection tender with Kenalog 20 with a Xylocaine plain was infiltrated to the inferior left heel and plantar fascial insertion. We'll followup with functional orthoses for future treatment       Assessment & Plan:  Assessment plantar fasciitis/heel spur syndrome left more so than right at this time Kenalog injection delivered to the left heel fascial strapping was reapplied after orthotics skin is carried out. We'll order functional orthoses were for orthotic with semirigid devices and Spenco top cover and patient currently wearing a pair of [place] shoes maintain good stable shoe or orthoses with shoes in the future as recommended pick up orthotics in 3-4 weeks for fitting  Harriet Masson DPM

## 2013-10-20 NOTE — Patient Instructions (Signed)

## 2013-10-24 ENCOUNTER — Other Ambulatory Visit: Payer: 59

## 2013-10-24 ENCOUNTER — Other Ambulatory Visit: Payer: Self-pay | Admitting: Obstetrics and Gynecology

## 2013-10-24 ENCOUNTER — Ambulatory Visit
Admission: RE | Admit: 2013-10-24 | Discharge: 2013-10-24 | Disposition: A | Discharge status: Home / Self Care | Payer: 59 | Source: Ambulatory Visit | Attending: Obstetrics and Gynecology | Admitting: Obstetrics and Gynecology

## 2013-10-24 ENCOUNTER — Ambulatory Visit (INDEPENDENT_AMBULATORY_CARE_PROVIDER_SITE_OTHER): Payer: 59

## 2013-10-24 DIAGNOSIS — N6320 Unspecified lump in the left breast, unspecified quadrant: Secondary | ICD-10-CM

## 2013-10-24 DIAGNOSIS — Z5181 Encounter for therapeutic drug level monitoring: Secondary | ICD-10-CM

## 2013-10-24 DIAGNOSIS — I4891 Unspecified atrial fibrillation: Secondary | ICD-10-CM

## 2013-10-24 DIAGNOSIS — I480 Paroxysmal atrial fibrillation: Secondary | ICD-10-CM

## 2013-10-24 LAB — POCT INR: INR: 2.2

## 2013-10-25 LAB — CYTOLOGY - PAP

## 2013-11-09 ENCOUNTER — Other Ambulatory Visit: Payer: Self-pay | Admitting: Internal Medicine

## 2013-11-11 ENCOUNTER — Emergency Department (HOSPITAL_BASED_OUTPATIENT_CLINIC_OR_DEPARTMENT_OTHER)
Admission: EM | Admit: 2013-11-11 | Discharge: 2013-11-11 | Disposition: A | Discharge status: Home / Self Care | Payer: 59 | Attending: Emergency Medicine | Admitting: Emergency Medicine

## 2013-11-11 ENCOUNTER — Encounter (HOSPITAL_BASED_OUTPATIENT_CLINIC_OR_DEPARTMENT_OTHER): Payer: Self-pay | Admitting: Emergency Medicine

## 2013-11-11 DIAGNOSIS — Z880 Allergy status to penicillin: Secondary | ICD-10-CM | POA: Insufficient documentation

## 2013-11-11 DIAGNOSIS — K219 Gastro-esophageal reflux disease without esophagitis: Secondary | ICD-10-CM | POA: Diagnosis not present

## 2013-11-11 DIAGNOSIS — Z86718 Personal history of other venous thrombosis and embolism: Secondary | ICD-10-CM | POA: Diagnosis not present

## 2013-11-11 DIAGNOSIS — X500XXA Overexertion from strenuous movement or load, initial encounter: Secondary | ICD-10-CM | POA: Insufficient documentation

## 2013-11-11 DIAGNOSIS — M069 Rheumatoid arthritis, unspecified: Secondary | ICD-10-CM | POA: Insufficient documentation

## 2013-11-11 DIAGNOSIS — J45909 Unspecified asthma, uncomplicated: Secondary | ICD-10-CM | POA: Insufficient documentation

## 2013-11-11 DIAGNOSIS — S99919A Unspecified injury of unspecified ankle, initial encounter: Principal | ICD-10-CM

## 2013-11-11 DIAGNOSIS — M79672 Pain in left foot: Secondary | ICD-10-CM

## 2013-11-11 DIAGNOSIS — Z91040 Latex allergy status: Secondary | ICD-10-CM | POA: Diagnosis not present

## 2013-11-11 DIAGNOSIS — E669 Obesity, unspecified: Secondary | ICD-10-CM | POA: Insufficient documentation

## 2013-11-11 DIAGNOSIS — S99929A Unspecified injury of unspecified foot, initial encounter: Principal | ICD-10-CM

## 2013-11-11 DIAGNOSIS — Z860100 Personal history of colon polyps, unspecified: Secondary | ICD-10-CM | POA: Insufficient documentation

## 2013-11-11 DIAGNOSIS — I4891 Unspecified atrial fibrillation: Secondary | ICD-10-CM | POA: Diagnosis not present

## 2013-11-11 DIAGNOSIS — Z79899 Other long term (current) drug therapy: Secondary | ICD-10-CM | POA: Diagnosis not present

## 2013-11-11 DIAGNOSIS — Z7901 Long term (current) use of anticoagulants: Secondary | ICD-10-CM | POA: Diagnosis not present

## 2013-11-11 DIAGNOSIS — IMO0002 Reserved for concepts with insufficient information to code with codable children: Secondary | ICD-10-CM | POA: Insufficient documentation

## 2013-11-11 DIAGNOSIS — Y929 Unspecified place or not applicable: Secondary | ICD-10-CM | POA: Diagnosis not present

## 2013-11-11 DIAGNOSIS — Y9389 Activity, other specified: Secondary | ICD-10-CM | POA: Diagnosis not present

## 2013-11-11 DIAGNOSIS — S8990XA Unspecified injury of unspecified lower leg, initial encounter: Secondary | ICD-10-CM | POA: Insufficient documentation

## 2013-11-11 DIAGNOSIS — Z950 Presence of cardiac pacemaker: Secondary | ICD-10-CM | POA: Insufficient documentation

## 2013-11-11 MED ORDER — ONDANSETRON 8 MG PO TBDP
8.0000 mg | ORAL_TABLET | Freq: Once | ORAL | Status: AC
  Administered 2013-11-11: 8 mg via ORAL
  Filled 2013-11-11: qty 1

## 2013-11-11 MED ORDER — OXYCODONE-ACETAMINOPHEN 5-325 MG PO TABS: 1.0000 | ORAL_TABLET | ORAL | Status: DC | PRN

## 2013-11-11 MED ORDER — OXYCODONE-ACETAMINOPHEN 5-325 MG PO TABS
1.0000 | ORAL_TABLET | Freq: Once | ORAL | Status: AC
  Administered 2013-11-11: 1 via ORAL
  Filled 2013-11-11: qty 1

## 2013-11-11 NOTE — ED Provider Notes (Signed)
CSN: [id number]     Arrival date & time 11/11/13  1342 History   First MD Initiated Contact with Patient 11/11/13 1359     Chief Complaint  Patient presents with  . Foot Pain     (Consider location/radiation/quality/duration/timing/severity/associated sxs/prior Treatment) Patient is a 58 y.o. female presenting with lower extremity pain. The history is provided by the patient. No language interpreter was used.  Foot Pain This is a new problem. The current episode started today. Pertinent negatives include no chills, fever or numbness. Associated symptoms comments: She reports a history of plantar fasciitis. Today while stepping up into a vehicle she hyper-dorsiflexed her left foot causing a burning type pain to plantar heel extending to medial ankle. She reports painful weight bearing since. .    Past Medical History  Diagnosis Date  . Allergic rhinitis   . Hypercholesteremia   . GERD (gastroesophageal reflux disease)   . Asthma   . PAF (paroxysmal atrial fibrillation)     a. s/p afib ablation at [facility] 2010 (Dr. Annabell Howells). b. Recurrent AF s/p DCCV 06/2010.   Marland Kitchen Rheumatoid arthritis(714.0)   . OSA (obstructive sleep apnea)     a. Mild, did not tolerate OSA.  . Obesity   . Atrial flutter     a. Remotely per notes.  . Wandering (atrial) pacemaker     a. Remotely per notes.  Marland Kitchen DVT (deep venous thrombosis)     a. 1980s x2 - between birth of two sons. Saw hematology - was told she has a hypercoagulable disorder, should be on Coumadin lifelong.  . Halothane adverse reaction     narrow small opening  . PONV (postoperative nausea and vomiting)   . Colon polyps    Past Surgical History  Procedure Laterality Date  . Cesarean section    . Carpal tunnel release Bilateral   . Inguinal hernia repair Right   . Cholecystectomy    . Tmj arthroplasty    . Afib ablation  2010    at [facility] History  Problem Relation Age of Onset  . Lupus Sister   . Stroke Mother   .  Heart attack Mother     2 previous MIs, 3 stents - CAD first diagnosed 85  . Heart attack Paternal Grandfather     Died at 13 of massive MI  . Stroke Maternal Grandfather     Multiple family members  . Thyroid disease Mother   . Colon polyps Father   . Stroke Paternal Grandmother   . Thyroid disease Sister     x 2   History  Substance Use Topics  . Smoking status: Never Smoker   . Smokeless tobacco: Never Used  . Alcohol Use: Yes     Comment: occasional beer - 1x/month   OB History   Grav Para Term Preterm Abortions TAB SAB Ect Mult Living                 Review of Systems  Constitutional: Negative for fever and chills.  Musculoskeletal:       See HPI.  Skin: Negative.   Neurological: Negative.  Negative for numbness.      Allergies  Ivp dye; Penicillins; Sulfonamide derivatives; Aspirin; Latex; Meperidine hcl; and Statins  Home Medications   Prior to Admission medications   Medication Sig Start Date End Date Taking? Authorizing Provider  ASMANEX 60 METERED DOSES 220 MCG/INH inhaler Inhale 1 puff into the lungs 2 (two) times daily.  04/03/10  Historical Provider, MD  calcium-vitamin D 250-100 MG-UNIT per tablet Take 1 tablet by mouth 2 (two) times daily.      Historical Provider, MD  cetirizine (ZYRTEC) 10 MG tablet Take 10 mg by mouth daily.      Historical Provider, MD  Cholecalciferol (VITAMIN D3) 1000 UNITS CAPS Take 1 capsule by mouth daily.     Historical Provider, MD  flecainide (TAMBOCOR) 100 MG tablet TAKE 1 TABLET BY MOUTH TWICE A DAY 11/10/13   Thompson Grayer, MD  fluticasone (FLONASE) 50 MCG/ACT nasal spray Place 1 spray into the nose 2 (two) times daily.  06/02/12   Historical Provider, MD  losartan (COZAAR) 25 MG tablet Take 1 tablet (25 mg total) by mouth daily. 04/26/13   Brooke O Edmisten, PA-C  Multiple Vitamin (MULTIVITAMIN) tablet Take 1 tablet by mouth daily.      Historical Provider, MD  Naftifine HCl (NAFTIN) 2 % CREA Apply to affected areas twice  daily for 2-4 weeks 09/27/13   Harriet Masson, DPM  omeprazole (PRILOSEC) 40 MG capsule Take 40 mg by mouth daily.  04/12/10   Historical Provider, MD  PROAIR HFA 108 (90 BASE) MCG/ACT inhaler Inhale 2 puffs into the lungs every 6 (six) hours as needed for wheezing or shortness of breath.  03/22/12   Historical Provider, MD  venlafaxine XR (EFFEXOR-XR) 37.5 MG 24 hr capsule Take 37.5 mg by mouth every morning. 11/21/12   Historical Provider, MD  verapamil (CALAN) 80 MG tablet Take 2 tablets by mouth 2 (two) times daily.  04/22/10   Historical Provider, MD  warfarin (COUMADIN) 2 MG tablet TAKE AS DIRECTED BY ANTICOAGULATION CLINIC 06/28/13   Thompson Grayer, MD   BP 146/84  Pulse 77  Temp(Src) 98.2 F (36.8 C) (Oral)  Resp 16  Ht 5\' 5"  (1.651 m)  Wt 240 lb (108.863 kg)  BMI 39.94 kg/m2  SpO2 97% Physical Exam  Constitutional: She is oriented to person, place, and time. She appears well-developed and well-nourished. No distress.  Pulmonary/Chest: Effort normal.  Musculoskeletal:  Left foot unremarkable in appearance. No swelling, discoloration or bony deformity. Tender plantar proximal arch and medial heel. FROM, no laxity.  Neurological: She is alert and oriented to person, place, and time.  Skin: Skin is warm and dry.  Psychiatric: She has a normal mood and affect.    ED Course  Procedures (including critical care time) Labs Review Labs Reviewed - No data to display  Imaging Review No results found.   EKG Interpretation None      MDM   Final diagnoses:  None    1. Left foot pain  No obvious rupture of tendon or fascia. ASO applied - she has a cane for walking. She has ortho follow up with Dr. Alfonso Ramus. Pain medication provided.    Dewaine Oats, PA-C 11/11/13 1528

## 2013-11-11 NOTE — Discharge Instructions (Signed)
Cryotherapy °Cryotherapy means treatment with cold. Ice or gel packs can be used to reduce both pain and swelling. Ice is the most helpful within the first 24 to 48 hours after an injury or flare-up from overusing a muscle or joint. Sprains, strains, spasms, burning pain, shooting pain, and aches can all be eased with ice. Ice can also be used when recovering from surgery. Ice is effective, has very few side effects, and is safe for most people to use. °PRECAUTIONS  °Ice is not a safe treatment option for people with: °· Raynaud phenomenon. This is a condition affecting small blood vessels in the extremities. Exposure to cold may cause your problems to return. °· Cold hypersensitivity. There are many forms of cold hypersensitivity, including: °¨ Cold urticaria. Red, itchy hives appear on the skin when the tissues begin to warm after being iced. °¨ Cold erythema. This is a red, itchy rash caused by exposure to cold. °¨ Cold hemoglobinuria. Red blood cells break down when the tissues begin to warm after being iced. The hemoglobin that carry oxygen are passed into the urine because they cannot combine with blood proteins fast enough. °· Numbness or altered sensitivity in the area being iced. °If you have any of the following conditions, do not use ice until you have discussed cryotherapy with your caregiver: °· Heart conditions, such as arrhythmia, angina, or chronic heart disease. °· High blood pressure. °· Healing wounds or open skin in the area being iced. °· Current infections. °· Rheumatoid arthritis. °· Poor circulation. °· Diabetes. °Ice slows the blood flow in the region it is applied. This is beneficial when trying to stop inflamed tissues from spreading irritating chemicals to surrounding tissues. However, if you expose your skin to cold temperatures for too long or without the proper protection, you can damage your skin or nerves. Watch for signs of skin damage due to cold. °HOME CARE INSTRUCTIONS °Follow  these tips to use ice and cold packs safely. °· Place a dry or damp towel between the ice and skin. A damp towel will cool the skin more quickly, so you may need to shorten the time that the ice is used. °· For a more rapid response, add gentle compression to the ice. °· Ice for no more than 10 to 20 minutes at a time. The bonier the area you are icing, the less time it will take to get the benefits of ice. °· Check your skin after 5 minutes to make sure there are no signs of a poor response to cold or skin damage. °· Rest 20 minutes or more between uses. °· Once your skin is numb, you can end your treatment. You can test numbness by very lightly touching your skin. The touch should be so light that you do not see the skin dimple from the pressure of your fingertip. When using ice, most people will feel these normal sensations in this order: cold, burning, aching, and numbness. °· Do not use ice on someone who cannot communicate their responses to pain, such as small children or people with dementia. °HOW TO MAKE AN ICE PACK °Ice packs are the most common way to use ice therapy. Other methods include ice massage, ice baths, and cryosprays. Muscle creams that cause a cold, tingly feeling do not offer the same benefits that ice offers and should not be used as a substitute unless recommended by your caregiver. °To make an ice pack, do one of the following: °· Place crushed ice or a   bag of frozen vegetables in a sealable plastic bag. Squeeze out the excess air. Place this bag inside another plastic bag. Slide the bag into a pillowcase or place a damp towel between your skin and the bag. °· Mix 3 parts water with 1 part rubbing alcohol. Freeze the mixture in a sealable plastic bag. When you remove the mixture from the freezer, it will be slushy. Squeeze out the excess air. Place this bag inside another plastic bag. Slide the bag into a pillowcase or place a damp towel between your skin and the bag. °SEEK MEDICAL CARE  IF: °· You develop white spots on your skin. This may give the skin a blotchy (mottled) appearance. °· Your skin turns blue or pale. °· Your skin becomes waxy or hard. °· Your swelling gets worse. °MAKE SURE YOU:  °· Understand these instructions. °· Will watch your condition. °· Will get help right away if you are not doing well or get worse. °Document Released: 11/03/2010 Document Revised: 07/24/2013 Document Reviewed: 11/03/2010 °ExitCare® Patient Information ©2015 [employer]. This information is not intended to replace advice given to you by your health care provider. Make sure you discuss any questions you have with your health care provider. ° °

## 2013-11-11 NOTE — ED Notes (Signed)
Pt reports feeling flushed and nauseated (suspects it may be due to pain med). Charlann Lange, PA-C notified. Zofran ODT and hold discharge orders received. Connye Burkitt, RN

## 2013-11-11 NOTE — ED Notes (Signed)
Ok to discharge, per EDP. Connye Burkitt, RN

## 2013-11-11 NOTE — ED Notes (Signed)
Pt reports stepping up into vehicle and hearing "tendon tear" on left foot/ankle

## 2013-11-12 NOTE — ED Provider Notes (Signed)
Medical screening examination/treatment/procedure(s) were performed by non-physician practitioner and as supervising physician I was immediately available for consultation/collaboration.   EKG Interpretation None        Ephraim Hamburger, MD 11/12/13 [phone]

## 2013-11-13 ENCOUNTER — Ambulatory Visit (INDEPENDENT_AMBULATORY_CARE_PROVIDER_SITE_OTHER): Payer: 59 | Admitting: Podiatry

## 2013-11-13 ENCOUNTER — Encounter: Payer: Self-pay | Admitting: Podiatry

## 2013-11-13 VITALS — BP 130/72 | HR 70 | Resp 16

## 2013-11-13 DIAGNOSIS — M722 Plantar fascial fibromatosis: Secondary | ICD-10-CM

## 2013-11-13 DIAGNOSIS — T148XXA Other injury of unspecified body region, initial encounter: Secondary | ICD-10-CM

## 2013-11-13 NOTE — Patient Instructions (Signed)

## 2013-11-14 NOTE — Progress Notes (Signed)
Subjective:     Patient ID: [name], female   DOB: [birth year], 58 y.o.   MRN: [id number]  HPI patient presents stating I lifted up on my foot I felt something tear and it felt like a shot gun went off in my arch   Review of Systems     Objective:   Physical Exam Neurovascular status is intact with bruising and ecchymosis of the medial side of the left arch in the mid arch area up with intense discomfort and when compared to the right signs that there is lack of medial fibers    Assessment:     Plantar fascial tear left secondary to acute injury    Plan:     Reviewed condition and dispensed air fracture walker to allow this to rest along with aggressive ice therapy. Reappoint for Dr. Blenda Mounts to reevaluate in 4 weeks and did discuss that long-term this should alleviate her chronic plantar fasciitis

## 2013-11-17 ENCOUNTER — Ambulatory Visit: Payer: 59

## 2013-11-21 ENCOUNTER — Ambulatory Visit (INDEPENDENT_AMBULATORY_CARE_PROVIDER_SITE_OTHER): Payer: 59 | Admitting: *Deleted

## 2013-11-21 DIAGNOSIS — Z5181 Encounter for therapeutic drug level monitoring: Secondary | ICD-10-CM

## 2013-11-21 DIAGNOSIS — I4891 Unspecified atrial fibrillation: Secondary | ICD-10-CM

## 2013-11-21 DIAGNOSIS — I480 Paroxysmal atrial fibrillation: Secondary | ICD-10-CM

## 2013-11-21 LAB — POCT INR: INR: 2.5

## 2013-12-12 ENCOUNTER — Encounter: Payer: Self-pay | Admitting: Internal Medicine

## 2013-12-13 ENCOUNTER — Ambulatory Visit: Payer: 59

## 2013-12-20 ENCOUNTER — Other Ambulatory Visit: Payer: Self-pay

## 2013-12-20 ENCOUNTER — Ambulatory Visit (INDEPENDENT_AMBULATORY_CARE_PROVIDER_SITE_OTHER): Payer: 59

## 2013-12-20 ENCOUNTER — Ambulatory Visit: Payer: 59 | Admitting: Internal Medicine

## 2013-12-20 ENCOUNTER — Ambulatory Visit (INDEPENDENT_AMBULATORY_CARE_PROVIDER_SITE_OTHER): Payer: 59 | Admitting: *Deleted

## 2013-12-20 VITALS — BP 139/74 | HR 74 | Resp 12

## 2013-12-20 DIAGNOSIS — I4891 Unspecified atrial fibrillation: Secondary | ICD-10-CM

## 2013-12-20 DIAGNOSIS — M76829 Posterior tibial tendinitis, unspecified leg: Secondary | ICD-10-CM

## 2013-12-20 DIAGNOSIS — M79671 Pain in right foot: Secondary | ICD-10-CM

## 2013-12-20 DIAGNOSIS — M76822 Posterior tibial tendinitis, left leg: Secondary | ICD-10-CM

## 2013-12-20 DIAGNOSIS — M79672 Pain in left foot: Secondary | ICD-10-CM

## 2013-12-20 DIAGNOSIS — M79609 Pain in unspecified limb: Secondary | ICD-10-CM

## 2013-12-20 DIAGNOSIS — M722 Plantar fascial fibromatosis: Secondary | ICD-10-CM

## 2013-12-20 DIAGNOSIS — T148XXA Other injury of unspecified body region, initial encounter: Secondary | ICD-10-CM

## 2013-12-20 DIAGNOSIS — Z5181 Encounter for therapeutic drug level monitoring: Secondary | ICD-10-CM

## 2013-12-20 DIAGNOSIS — I480 Paroxysmal atrial fibrillation: Secondary | ICD-10-CM

## 2013-12-20 DIAGNOSIS — B351 Tinea unguium: Secondary | ICD-10-CM

## 2013-12-20 DIAGNOSIS — R42 Dizziness and giddiness: Secondary | ICD-10-CM

## 2013-12-20 LAB — POCT INR: INR: 2.3

## 2013-12-20 MED ORDER — LOSARTAN POTASSIUM 25 MG PO TABS: 25.0000 mg | ORAL_TABLET | Freq: Every day | ORAL | Status: AC

## 2013-12-20 MED ORDER — TAVABOROLE 5 % EX SOLN: CUTANEOUS | Status: DC

## 2013-12-20 MED ORDER — WARFARIN SODIUM 2 MG PO TABS: 2.0000 mg | ORAL_TABLET | ORAL | Status: DC

## 2013-12-20 MED ORDER — FLECAINIDE ACETATE 100 MG PO TABS: ORAL_TABLET | ORAL | Status: DC

## 2013-12-20 NOTE — Progress Notes (Signed)
   Subjective:    Patient ID: [name], female    DOB: Jul 27, 1955, 58 y.o.   MRN: [id number]  HPI ''LT FOOT IS DOING A LITTLE BETTER BUT STILL LITTLE SORE.''   Review of Systems no new findings or systemic changes noted     Objective:   Physical Exam Neurovascular status is intact and unchanged pedal pulses are palpable patient is wearing orthotics for the past couple days and then an air fracture boot for at least 4 weeks has posterior tibial tendinitis partial tear or strain still has some swelling and edema although intact function of the posterior tibial tendon is noted in the weekend and tender but not as bad as it had been. Seems to be improving steadily seems to be stable with the orthoses as well orthotics fit and contour well continue with orthotics also ankle is dispensed to help with the swelling of the ankle area as well. The only other new issue at this time is patient does have some thickening discoloration dystrophy of nails first left third right consistent with onychomycosis patient is requesting treatment and will initiate topical keratin treatment at this time. No other new changes or findings open wounds if no improvement with tendinitis within the next one to 2 months maybe candidate for MRI evaluation for partial tear and possible surgical intervention if needed       Assessment & Plan:  Assessment plantar fasciitis/heel spur center responding to orthoses posterior tibial tendon tear/strain improving with immobilization and maintaining orthotics at this time. Reevaluate with the next one to 2 months if no improvement may be candidate for more invasive options MRI noninvasive study and possible surgical repair of PT tendon if needed also at this time initiate topical keratin prescription is given [facility] will top apply topical keratin daily to the affected nails for 12 months as instructed reappointed 2 months for reevaluation next  Harriet Masson  DPM

## 2013-12-20 NOTE — Telephone Encounter (Signed)
ENCOUNTER OPENED IN ERROR

## 2013-12-20 NOTE — Patient Instructions (Signed)

## 2014-01-10 ENCOUNTER — Ambulatory Visit (INDEPENDENT_AMBULATORY_CARE_PROVIDER_SITE_OTHER): Payer: 59 | Admitting: Internal Medicine

## 2014-01-10 ENCOUNTER — Encounter: Payer: Self-pay | Admitting: Internal Medicine

## 2014-01-10 VITALS — BP 136/82 | HR 77 | Ht 65.0 in | Wt 249.8 lb

## 2014-01-10 DIAGNOSIS — I10 Essential (primary) hypertension: Secondary | ICD-10-CM

## 2014-01-10 DIAGNOSIS — G4730 Sleep apnea, unspecified: Secondary | ICD-10-CM

## 2014-01-10 DIAGNOSIS — I4891 Unspecified atrial fibrillation: Secondary | ICD-10-CM

## 2014-01-10 DIAGNOSIS — I480 Paroxysmal atrial fibrillation: Secondary | ICD-10-CM

## 2014-01-10 DIAGNOSIS — E669 Obesity, unspecified: Secondary | ICD-10-CM | POA: Insufficient documentation

## 2014-01-10 MED ORDER — FLECAINIDE ACETATE 50 MG PO TABS: 75.0000 mg | ORAL_TABLET | Freq: Two times a day (BID) | ORAL | Status: DC

## 2014-01-10 NOTE — Progress Notes (Signed)
PCP:  Gara Kroner, MD  The patient presents today for electrophysiology followup.  Since last being seen in our clinic, she has done reasonably well.  She feels that her afib has been controlled. She is limited by plantar fasciitis and torn posterior tibial tendon.  She continues to struggle with her weight.  Today, she denies symptoms of  orthopnea, PND,  presyncope, syncope, or neurologic sequela.  The patient feels that she is tolerating medications without difficulties and is otherwise without complaint today.   Past Medical History  Diagnosis Date  . Allergic rhinitis   . Hypercholesteremia   . GERD (gastroesophageal reflux disease)   . Asthma   . PAF (paroxysmal atrial fibrillation)     a. s/p afib ablation at [facility] 2010 (Dr. Annabell Howells). b. Recurrent AF s/p DCCV 06/2010.   Marland Kitchen Rheumatoid arthritis(714.0)   . OSA (obstructive sleep apnea)     a. Mild, did not tolerate OSA.  . Obesity   . Atrial flutter     a. Remotely per notes.  . Wandering (atrial) pacemaker     a. Remotely per notes.  Marland Kitchen DVT (deep venous thrombosis)     a. 1980s x2 - between birth of two sons. Saw hematology - was told she has a hypercoagulable disorder, should be on Coumadin lifelong.  . Halothane adverse reaction     narrow small opening  . PONV (postoperative nausea and vomiting)   . Colon polyps    Past Surgical History  Procedure Laterality Date  . Cesarean section    . Carpal tunnel release Bilateral   . Inguinal hernia repair Right   . Cholecystectomy    . Tmj arthroplasty    . Afib ablation  2010    at [facility]  Medication Sig Dispense Refill  . ASMANEX 60 METERED DOSES 220 MCG/INH inhaler Inhale 1 puff into the lungs 2 (two) times daily.       . calcium-vitamin D 250-100 MG-UNIT per tablet Take 1 tablet by mouth daily.       . cetirizine (ZYRTEC) 10 MG tablet Take 10 mg by mouth daily.        . Cholecalciferol (VITAMIN D3) 1000 UNITS CAPS Take 1  capsule by mouth daily.       . flecainide (TAMBOCOR) 50 MG tablet Take 1.5 tablets (75 mg total) by mouth 2 (two) times daily. TAKE 1 TABLET BY MOUTH TWICE A DAY  90 tablet  3  . fluticasone (FLONASE) 50 MCG/ACT nasal spray Place 1 spray into the nose 2 (two) times daily.       Marland Kitchen losartan (COZAAR) 25 MG tablet Take 1 tablet (25 mg total) by mouth daily.  90 tablet  0  . Multiple Vitamin (MULTIVITAMIN) tablet Take 1 tablet by mouth daily.        Marland Kitchen omeprazole (PRILOSEC) 40 MG capsule Take 40 mg by mouth daily.       Marland Kitchen PROAIR HFA 108 (90 BASE) MCG/ACT inhaler Inhale 2 puffs into the lungs every 6 (six) hours as needed for wheezing or shortness of breath.       Corrie Dandy (KERYDIN) 5 % SOLN Apply one drop to each affected nail once daily for 12 months  10 mL  4  . venlafaxine XR (EFFEXOR-XR) 37.5 MG 24 hr capsule Take 37.5 mg by mouth every morning.      . verapamil (CALAN) 80 MG tablet Take 2 tablets by mouth 2 (two) times daily.       Marland Kitchen  warfarin (COUMADIN) 2 MG tablet Take 1 tablet (2 mg total) by mouth as directed.  210 tablet  1   Current Facility-Administered Medications  Medication Dose Route Frequency Provider Last Rate Last Dose  . triamcinolone acetonide (KENALOG) 10 MG/ML injection 10 mg  10 mg Other Once Harriet Masson, DPM      . triamcinolone acetonide (KENALOG) 10 MG/ML injection 10 mg  10 mg Other Once Harriet Masson, DPM        Allergies  Allergen Reactions  . Aspirin Other (See Comments)    Makes asthma worse, makes stomach hurt  . Ivp Dye [Iodinated Diagnostic Agents] Anaphylaxis    Required epinephrine. Since then, has tolerated with premed.  . Latex Other (See Comments)    Wheezing/cough   . Meperidine Hcl Other (See Comments)    Projectile vomiting   . Penicillins Anaphylaxis  . Statins Other (See Comments)    Severe leg pain - has tried most of them except the newest one  . Sulfonamide Derivatives Anaphylaxis    History   Social History  . Marital Status:  Married    Spouse Name: N/A    Number of Children: 2  . Years of Education: N/A   Occupational History  . RN    Social History Main Topics  . Smoking status: Never Smoker   . Smokeless tobacco: Never Used  . Alcohol Use: Yes     Comment: occasional beer - 1x/month  . Drug Use: No  . Sexual Activity: No   Other Topics Concern  . Not on file   Social History Narrative  . No narrative on file    Family History  Problem Relation Age of Onset  . Lupus Sister   . Stroke Mother   . Heart attack Mother     2 previous MIs, 3 stents - CAD first diagnosed 3  . Heart attack Paternal Grandfather     Died at 7 of massive MI  . Stroke Maternal Grandfather     Multiple family members  . Thyroid disease Mother   . Colon polyps Father   . Stroke Paternal Grandmother   . Thyroid disease Sister     x 2    ROS-  + controlled asthma, 2 pillows required to sleep.  All other systems are reviewed and are negative except as outlined in the HPI above  Physical Exam: Filed Vitals:   01/10/14 0919  BP: 136/82  Pulse: 77  Height: 5\' 5"  (1.651 m)  Weight: 249 lb 12.8 oz (113.309 kg)    GEN- The patient is overweight appearing, alert and oriented x 3 today.   Head- normocephalic, atraumatic Eyes-  Sclera clear, conjunctiva pink Ears- hearing intact Oropharynx- clear Neck- supple,  Lungs- Clear to ausculation bilaterally, normal work of breathing Heart- regular rate and rhythm, no murmurs, rubs or gallops, PMI not laterally displaced GI- soft, NT, ND, + BS Extremities- no clubbing, cyanosis, + trace edema MS- no significant deformity or atrophy Skin- no rash or lesion Psych- euthymic mood, full affect Neuro- strength and sensation are intact  EKG today reveals  Sinus rhythm 76 bpm,  incomplete rbbb, otherwise normal ekg  A/p  1. afib Stable No change required today Will try to reduce flecainide to 75mg  BID Continue long term anticoagulation  2. HTn Stable No change  required today  3. obestity I had a long discussion today about the importance of weight loss.  I have encouraged her to consider weight watchers.  4. Sleep  apnea She carries a h/o mild sleep apnea but has not previously tolerated cpap I will refer to Dr Carlena Sax for sleep evaluation and management The importance of weight reduction and treatment of sleep apnea was stressed today.  Return to see me in 12 months

## 2014-01-10 NOTE — Patient Instructions (Signed)
Your physician wants you to follow-up in: 12 months with Dr Vallery Ridge will receive a reminder letter in the mail two months in advance. If you don't receive a letter, please call our office to schedule the follow-up appointment.  You have been referred to Dr Nehemiah Settle  Try Weight Watchers   Your physician has recommended you make the following change in your medication:  1)Decrease Flecaide to 75mg  twice daily

## 2014-01-23 NOTE — Progress Notes (Unsigned)
This encounter was created in error - please disregard.

## 2014-01-31 ENCOUNTER — Ambulatory Visit (INDEPENDENT_AMBULATORY_CARE_PROVIDER_SITE_OTHER): Payer: 59 | Admitting: Surgery

## 2014-01-31 DIAGNOSIS — Z5181 Encounter for therapeutic drug level monitoring: Secondary | ICD-10-CM

## 2014-01-31 DIAGNOSIS — I4891 Unspecified atrial fibrillation: Secondary | ICD-10-CM | POA: Diagnosis not present

## 2014-01-31 DIAGNOSIS — I480 Paroxysmal atrial fibrillation: Secondary | ICD-10-CM

## 2014-01-31 LAB — POCT INR: INR: 1.8

## 2014-02-28 ENCOUNTER — Telehealth: Payer: Self-pay | Admitting: *Deleted

## 2014-02-28 ENCOUNTER — Ambulatory Visit (INDEPENDENT_AMBULATORY_CARE_PROVIDER_SITE_OTHER): Payer: 59 | Admitting: *Deleted

## 2014-02-28 DIAGNOSIS — I4891 Unspecified atrial fibrillation: Secondary | ICD-10-CM | POA: Diagnosis not present

## 2014-02-28 DIAGNOSIS — I480 Paroxysmal atrial fibrillation: Secondary | ICD-10-CM | POA: Diagnosis not present

## 2014-02-28 DIAGNOSIS — Z5181 Encounter for therapeutic drug level monitoring: Secondary | ICD-10-CM

## 2014-02-28 LAB — POCT INR: INR: 2.2

## 2014-02-28 MED ORDER — FLECAINIDE ACETATE 100 MG PO TABS: 100.0000 mg | ORAL_TABLET | Freq: Two times a day (BID) | ORAL | Status: DC

## 2014-02-28 NOTE — Telephone Encounter (Signed)
Patient in for CVRR visit and says that the reduced dose of Flecainide is not working.  She requested to return to the 100mg  bid dosing.  I have called this into her pharmacy

## 2014-03-08 ENCOUNTER — Telehealth: Payer: Self-pay | Admitting: Internal Medicine

## 2014-03-08 NOTE — Telephone Encounter (Signed)
Returned call to patient she stated her heart has been out of rhythm off and on since yesterday.Stated this time she has had 2 choking sensations.Stated her chest feels achy off and on and she is weak,tired.Stated no choking sensation at present.Appointment scheduled with Sharrell Ku PA 03/09/14 at 11:00 am.Advised to go to ER if needed.

## 2014-03-08 NOTE — Telephone Encounter (Signed)
New Prob    Pt requesting to speak to nurse regarding 2 episodes of "choking spells" and 2 to 1 flutters. Please call.

## 2014-03-09 ENCOUNTER — Encounter: Payer: 59 | Admitting: Physician Assistant

## 2014-03-09 ENCOUNTER — Encounter: Payer: Self-pay | Admitting: Physician Assistant

## 2014-03-09 NOTE — Progress Notes (Signed)
This encounter was created in error - please disregard.

## 2014-03-28 ENCOUNTER — Ambulatory Visit (INDEPENDENT_AMBULATORY_CARE_PROVIDER_SITE_OTHER): Payer: 59 | Admitting: *Deleted

## 2014-03-28 DIAGNOSIS — Z5181 Encounter for therapeutic drug level monitoring: Secondary | ICD-10-CM

## 2014-03-28 DIAGNOSIS — I4891 Unspecified atrial fibrillation: Secondary | ICD-10-CM

## 2014-03-28 DIAGNOSIS — I480 Paroxysmal atrial fibrillation: Secondary | ICD-10-CM

## 2014-03-28 LAB — POCT INR: INR: 2.5

## 2014-04-19 ENCOUNTER — Ambulatory Visit (HOSPITAL_BASED_OUTPATIENT_CLINIC_OR_DEPARTMENT_OTHER): Payer: 59

## 2014-05-02 ENCOUNTER — Encounter: Payer: Self-pay | Admitting: Internal Medicine

## 2014-05-24 ENCOUNTER — Ambulatory Visit (INDEPENDENT_AMBULATORY_CARE_PROVIDER_SITE_OTHER): Payer: 59 | Admitting: Pharmacist

## 2014-05-24 DIAGNOSIS — Z5181 Encounter for therapeutic drug level monitoring: Secondary | ICD-10-CM

## 2014-05-24 DIAGNOSIS — I480 Paroxysmal atrial fibrillation: Secondary | ICD-10-CM | POA: Diagnosis not present

## 2014-05-24 DIAGNOSIS — I4891 Unspecified atrial fibrillation: Secondary | ICD-10-CM | POA: Diagnosis not present

## 2014-05-24 LAB — POCT INR: INR: 1.8

## 2014-05-28 ENCOUNTER — Ambulatory Visit (INDEPENDENT_AMBULATORY_CARE_PROVIDER_SITE_OTHER): Payer: 59 | Admitting: Internal Medicine

## 2014-05-28 ENCOUNTER — Other Ambulatory Visit: Payer: Self-pay

## 2014-05-28 ENCOUNTER — Encounter: Payer: Self-pay | Admitting: Internal Medicine

## 2014-05-28 VITALS — BP 132/78 | HR 74 | Ht 65.0 in | Wt 251.2 lb

## 2014-05-28 DIAGNOSIS — I480 Paroxysmal atrial fibrillation: Secondary | ICD-10-CM

## 2014-05-28 DIAGNOSIS — I10 Essential (primary) hypertension: Secondary | ICD-10-CM | POA: Diagnosis not present

## 2014-05-28 DIAGNOSIS — E669 Obesity, unspecified: Secondary | ICD-10-CM

## 2014-05-28 DIAGNOSIS — I4891 Unspecified atrial fibrillation: Secondary | ICD-10-CM | POA: Diagnosis not present

## 2014-05-28 MED ORDER — VERAPAMIL HCL 80 MG PO TABS: 120.0000 mg | ORAL_TABLET | Freq: Three times a day (TID) | ORAL | Status: DC

## 2014-05-28 NOTE — Progress Notes (Signed)
Electrophysiology Office Note   Date:  05/28/2014   ID:  ATHEA HALEY, DOB [birth year], MRN [id number]  PCP:  Gara Kroner, MD   Primary Electrophysiologist: Thompson Grayer, MD    Chief Complaint  Patient presents with  . Follow-up    AFIB     History of Present Illness: [name] is a 59 y.o. female who presents today for electrophysiology evaluation.   She has begun to notice increased ectopy.  She has an Psychologist, forensic which documents primarily pacs as the cause.  She also has nonsustained atach.  I do not see any documented afib.  She finds these symptoms to be worrisome but not limiting.  Today, she denies symptoms of chest pain, shortness of breath, orthopnea, PND, lower extremity edema, claudication, dizziness, presyncope, syncope, bleeding, or neurologic sequela. The patient is tolerating medications without difficulties and is otherwise without complaint today.    Past Medical History  Diagnosis Date  . Allergic rhinitis   . Hypercholesteremia   . GERD (gastroesophageal reflux disease)   . Asthma   . PAF (paroxysmal atrial fibrillation)     a. s/p afib ablation at [facility] 2010 (Dr. Annabell Howells). b. Recurrent AF s/p DCCV 06/2010. c. On flecainide.   . Rheumatoid arthritis(714.0)   . OSA (obstructive sleep apnea)     a. Mild, did not tolerate OSA.  . Obesity   . Atrial flutter     a. Remotely per notes.  . Wandering (atrial) pacemaker     a. Remotely per notes.  Marland Kitchen DVT (deep venous thrombosis)     a. 1980s x2 - between birth of two sons. Saw hematology - was told she has a hypercoagulable disorder, should be on Coumadin lifelong.  . Halothane adverse reaction     narrow small opening  . PONV (postoperative nausea and vomiting)   . Colon polyps   . Normal cardiac stress test 06/2012  . Hyperglycemia     a. A1c 6.1 in 2014.   Past Surgical History  Procedure Laterality Date  . Cesarean section    . Carpal tunnel release Bilateral   . Inguinal  hernia repair Right   . Cholecystectomy    . Tmj arthroplasty    . Afib ablation  2010    at [place] Prescriptions  Medication Sig Dispense Refill  . ASMANEX 60 METERED DOSES 220 MCG/INH inhaler Inhale 1 puff into the lungs 2 (two) times daily.     . calcium-vitamin D 250-100 MG-UNIT per tablet Take 1 tablet by mouth daily.     . cetirizine (ZYRTEC) 10 MG tablet Take 10 mg by mouth daily.      . Cholecalciferol (VITAMIN D3) 1000 UNITS CAPS Take 1 capsule by mouth daily.     . flecainide (TAMBOCOR) 50 MG tablet Take 50 mg by mouth 2 (two) times daily.    . fluticasone (FLONASE) 50 MCG/ACT nasal spray Place 1 spray into the nose 2 (two) times daily.     Marland Kitchen losartan (COZAAR) 25 MG tablet Take 1 tablet (25 mg total) by mouth daily. 90 tablet 0  . Multiple Vitamin (MULTIVITAMIN) tablet Take 1 tablet by mouth daily.      Marland Kitchen omeprazole (PRILOSEC) 40 MG capsule Take 40 mg by mouth daily.     Marland Kitchen PROAIR HFA 108 (90 BASE) MCG/ACT inhaler Inhale 2 puffs into the lungs every 6 (six) hours as needed for wheezing or shortness of breath.     Marland Kitchen  Tavaborole (KERYDIN) 5 % SOLN Apply one drop to each affected nail once daily for 12 months 10 mL 4  . venlafaxine XR (EFFEXOR-XR) 37.5 MG 24 hr capsule Take 37.5 mg by mouth every morning.    . verapamil (CALAN) 80 MG tablet Take 1.5 tablets (120 mg total) by mouth 3 (three) times daily.    Marland Kitchen warfarin (COUMADIN) 2 MG tablet Take 1 tablet (2 mg total) by mouth as directed. 210 tablet 1   Current Facility-Administered Medications  Medication Dose Route Frequency Provider Last Rate Last Dose  . triamcinolone acetonide (KENALOG) 10 MG/ML injection 10 mg  10 mg Other Once Harriet Masson, DPM      . triamcinolone acetonide (KENALOG) 10 MG/ML injection 10 mg  10 mg Other Once Harriet Masson, DPM        Allergies:   Aspirin; Ivp dye; Latex; Meperidine hcl; Penicillins; Statins; and Sulfonamide derivatives   Social History:  The patient   reports that she has never smoked. She has never used smokeless tobacco. She reports that she drinks alcohol. She reports that she does not use illicit drugs.   Family History:  The patient's  family history includes Colon polyps in her father; Heart attack in her mother and paternal grandfather; Lupus in her sister; Stroke in her maternal grandfather, mother, and paternal grandmother; Thyroid disease in her mother and sister.    ROS:  Please see the history of present illness.   All other systems are reviewed and negative.    PHYSICAL EXAM: VS:  BP 132/78 mmHg  Pulse 74  Ht 5\' 5"  (1.651 m)  Wt 251 lb 3.2 oz (113.944 kg)  BMI 41.80 kg/m2 , BMI Body mass index is 41.8 kg/(m^2). GEN: Well nourished, well developed, in no acute distress HEENT: normal Neck: no JVD, carotid bruits, or masses Cardiac: RRR; no murmurs, rubs, or gallops,no edema  Respiratory:  clear to auscultation bilaterally, normal work of breathing GI: soft, nontender, nondistended, + BS MS: no deformity or atrophy Skin: warm and dry  Neuro:  Strength and sensation are intact Psych: euthymic mood, full affect  EKG:  EKG is ordered today. The ekg ordered today shows sinus rhythm with frequent ectopic atrial beats, rsr', otherwise normal ekg   Recent Labs: No results found for requested labs within last 365 days.    Lipid Panel     Component Value Date/Time   CHOL 238* 01/13/2013 0139   TRIG 247* 01/13/2013 0139   HDL 48 01/13/2013 0139   CHOLHDL 5.0 01/13/2013 0139   VLDL 49* 01/13/2013 0139   LDLCALC 141* 01/13/2013 0139     Wt Readings from Last 3 Encounters:  05/28/14 251 lb 3.2 oz (113.944 kg)  01/10/14 249 lb 12.8 oz (113.309 kg)  11/11/13 240 lb (108.863 kg)    ASSESSMENT AND PLAN:  1. afib Stable though she has had more frequent atrial ectopy Increase diltiazem to 120mg  TID Consider increasing flecainide if symptoms worsen.  We discussed multaq and tikosyn as alternatives. Continue long term  anticoagulation  2. HTN Stable No change required today  3. obestity I had a long discussion today about the importance of weight loss.   4. Sleep apnea Mild She has recently seen Dr Kelly Caul who recommends conservative measures  Follow-up with Roderic Palau NP every 3 months Return to see me in 12 months   Current medicines are reviewed at length with the patient today.   The patient does not have concerns regarding her medicines.  The  following changes were made today:  none  Labs/ tests ordered today include:  Orders Placed This Encounter  Procedures  . EKG 12-Lead    Signed, Thompson Grayer, MD  05/28/2014 11:15 PM     [address] [place] [address] [phone] (office) [phone] (fax)

## 2014-05-28 NOTE — Patient Instructions (Signed)
Your physician recommends that you schedule a follow-up appointment in: 3 months with Roderic Palau, NP  Your physician has recommended you make the following change in your medication:  1) Increase Verapamil to 80mg  take 1 1/2 tablets three times a day

## 2014-06-19 ENCOUNTER — Ambulatory Visit (INDEPENDENT_AMBULATORY_CARE_PROVIDER_SITE_OTHER): Payer: 59

## 2014-06-19 DIAGNOSIS — Z5181 Encounter for therapeutic drug level monitoring: Secondary | ICD-10-CM

## 2014-06-19 DIAGNOSIS — I4891 Unspecified atrial fibrillation: Secondary | ICD-10-CM | POA: Diagnosis not present

## 2014-06-19 DIAGNOSIS — I480 Paroxysmal atrial fibrillation: Secondary | ICD-10-CM

## 2014-06-19 LAB — POCT INR: INR: 1.9

## 2014-06-19 MED ORDER — FLECAINIDE ACETATE 50 MG PO TABS: 50.0000 mg | ORAL_TABLET | Freq: Two times a day (BID) | ORAL | Status: DC

## 2014-06-19 MED ORDER — WARFARIN SODIUM 2 MG PO TABS: 2.0000 mg | ORAL_TABLET | ORAL | Status: DC

## 2014-07-17 ENCOUNTER — Ambulatory Visit (INDEPENDENT_AMBULATORY_CARE_PROVIDER_SITE_OTHER): Payer: 59 | Admitting: *Deleted

## 2014-07-17 DIAGNOSIS — Z5181 Encounter for therapeutic drug level monitoring: Secondary | ICD-10-CM

## 2014-07-17 DIAGNOSIS — I4891 Unspecified atrial fibrillation: Secondary | ICD-10-CM

## 2014-07-17 DIAGNOSIS — I480 Paroxysmal atrial fibrillation: Secondary | ICD-10-CM | POA: Diagnosis not present

## 2014-07-17 LAB — POCT INR: INR: 2.3

## 2014-08-09 ENCOUNTER — Ambulatory Visit (INDEPENDENT_AMBULATORY_CARE_PROVIDER_SITE_OTHER): Payer: 59

## 2014-08-09 DIAGNOSIS — I4891 Unspecified atrial fibrillation: Secondary | ICD-10-CM | POA: Diagnosis not present

## 2014-08-09 DIAGNOSIS — I480 Paroxysmal atrial fibrillation: Secondary | ICD-10-CM | POA: Diagnosis not present

## 2014-08-09 DIAGNOSIS — Z5181 Encounter for therapeutic drug level monitoring: Secondary | ICD-10-CM

## 2014-08-09 LAB — POCT INR: INR: 2.2

## 2014-10-02 ENCOUNTER — Ambulatory Visit (INDEPENDENT_AMBULATORY_CARE_PROVIDER_SITE_OTHER): Payer: 59 | Admitting: Pharmacist Clinician (PhC)/ Clinical Pharmacy Specialist

## 2014-10-02 DIAGNOSIS — I4891 Unspecified atrial fibrillation: Secondary | ICD-10-CM | POA: Diagnosis not present

## 2014-10-02 DIAGNOSIS — I480 Paroxysmal atrial fibrillation: Secondary | ICD-10-CM | POA: Diagnosis not present

## 2014-10-02 DIAGNOSIS — Z5181 Encounter for therapeutic drug level monitoring: Secondary | ICD-10-CM

## 2014-10-02 LAB — POCT INR: INR: 2.2

## 2014-10-29 ENCOUNTER — Other Ambulatory Visit: Payer: Self-pay | Admitting: Obstetrics and Gynecology

## 2014-10-30 LAB — CYTOLOGY - PAP

## 2014-10-31 ENCOUNTER — Ambulatory Visit (INDEPENDENT_AMBULATORY_CARE_PROVIDER_SITE_OTHER): Payer: 59 | Admitting: *Deleted

## 2014-10-31 DIAGNOSIS — Z5181 Encounter for therapeutic drug level monitoring: Secondary | ICD-10-CM | POA: Diagnosis not present

## 2014-10-31 DIAGNOSIS — I4891 Unspecified atrial fibrillation: Secondary | ICD-10-CM

## 2014-10-31 DIAGNOSIS — I480 Paroxysmal atrial fibrillation: Secondary | ICD-10-CM

## 2014-10-31 LAB — POCT INR: INR: 2.8

## 2014-11-29 ENCOUNTER — Emergency Department (HOSPITAL_BASED_OUTPATIENT_CLINIC_OR_DEPARTMENT_OTHER): Payer: 59

## 2014-11-29 ENCOUNTER — Other Ambulatory Visit: Payer: Self-pay

## 2014-11-29 ENCOUNTER — Emergency Department (HOSPITAL_BASED_OUTPATIENT_CLINIC_OR_DEPARTMENT_OTHER)
Admission: EM | Admit: 2014-11-29 | Discharge: 2014-11-29 | Disposition: A | Discharge status: Home / Self Care | Payer: 59 | Attending: Emergency Medicine | Admitting: Emergency Medicine

## 2014-11-29 ENCOUNTER — Encounter (HOSPITAL_BASED_OUTPATIENT_CLINIC_OR_DEPARTMENT_OTHER): Payer: Self-pay | Admitting: Emergency Medicine

## 2014-11-29 DIAGNOSIS — Z8669 Personal history of other diseases of the nervous system and sense organs: Secondary | ICD-10-CM | POA: Insufficient documentation

## 2014-11-29 DIAGNOSIS — R002 Palpitations: Secondary | ICD-10-CM | POA: Insufficient documentation

## 2014-11-29 DIAGNOSIS — R34 Anuria and oliguria: Secondary | ICD-10-CM | POA: Diagnosis not present

## 2014-11-29 DIAGNOSIS — M549 Dorsalgia, unspecified: Secondary | ICD-10-CM | POA: Diagnosis not present

## 2014-11-29 DIAGNOSIS — Z7951 Long term (current) use of inhaled steroids: Secondary | ICD-10-CM | POA: Insufficient documentation

## 2014-11-29 DIAGNOSIS — Z950 Presence of cardiac pacemaker: Secondary | ICD-10-CM | POA: Insufficient documentation

## 2014-11-29 DIAGNOSIS — J45909 Unspecified asthma, uncomplicated: Secondary | ICD-10-CM | POA: Diagnosis not present

## 2014-11-29 DIAGNOSIS — R112 Nausea with vomiting, unspecified: Secondary | ICD-10-CM | POA: Insufficient documentation

## 2014-11-29 DIAGNOSIS — Z91040 Latex allergy status: Secondary | ICD-10-CM | POA: Insufficient documentation

## 2014-11-29 DIAGNOSIS — Z86718 Personal history of other venous thrombosis and embolism: Secondary | ICD-10-CM | POA: Insufficient documentation

## 2014-11-29 DIAGNOSIS — R630 Anorexia: Secondary | ICD-10-CM | POA: Diagnosis not present

## 2014-11-29 DIAGNOSIS — R42 Dizziness and giddiness: Secondary | ICD-10-CM | POA: Diagnosis not present

## 2014-11-29 DIAGNOSIS — Z880 Allergy status to penicillin: Secondary | ICD-10-CM | POA: Diagnosis not present

## 2014-11-29 DIAGNOSIS — Z79899 Other long term (current) drug therapy: Secondary | ICD-10-CM | POA: Diagnosis not present

## 2014-11-29 DIAGNOSIS — E669 Obesity, unspecified: Secondary | ICD-10-CM | POA: Insufficient documentation

## 2014-11-29 DIAGNOSIS — R197 Diarrhea, unspecified: Secondary | ICD-10-CM | POA: Insufficient documentation

## 2014-11-29 DIAGNOSIS — Z86010 Personal history of colonic polyps: Secondary | ICD-10-CM | POA: Insufficient documentation

## 2014-11-29 DIAGNOSIS — K219 Gastro-esophageal reflux disease without esophagitis: Secondary | ICD-10-CM | POA: Insufficient documentation

## 2014-11-29 LAB — CBC WITH DIFFERENTIAL/PLATELET
Basophils Absolute: 0 10*3/uL (ref 0.0–0.1)
Basophils Relative: 0 % (ref 0–1)
EOS PCT: 2 % (ref 0–5)
Eosinophils Absolute: 0.1 10*3/uL (ref 0.0–0.7)
HCT: 39.6 % (ref 36.0–46.0)
Hemoglobin: 13.1 g/dL (ref 12.0–15.0)
Lymphocytes Relative: 52 % — ABNORMAL HIGH (ref 12–46)
Lymphs Abs: 2.2 10*3/uL (ref 0.7–4.0)
MCH: 29.9 pg (ref 26.0–34.0)
MCHC: 33.1 g/dL (ref 30.0–36.0)
MCV: 90.4 fL (ref 78.0–100.0)
MONO ABS: 0.5 10*3/uL (ref 0.1–1.0)
MONOS PCT: 12 % (ref 3–12)
NEUTROS ABS: 1.4 10*3/uL — AB (ref 1.7–7.7)
Neutrophils Relative %: 34 % — ABNORMAL LOW (ref 43–77)
PLATELETS: 285 10*3/uL (ref 150–400)
RBC: 4.38 MIL/uL (ref 3.87–5.11)
RDW: 13.2 % (ref 11.5–15.5)
WBC: 4.3 10*3/uL (ref 4.0–10.5)

## 2014-11-29 LAB — COMPREHENSIVE METABOLIC PANEL
ALBUMIN: 3.3 g/dL — AB (ref 3.5–5.0)
ALT: 58 U/L — AB (ref 14–54)
AST: 51 U/L — AB (ref 15–41)
Alkaline Phosphatase: 69 U/L (ref 38–126)
Anion gap: 9 (ref 5–15)
BUN: 12 mg/dL (ref 6–20)
CO2: 27 mmol/L (ref 22–32)
CREATININE: 0.74 mg/dL (ref 0.44–1.00)
Calcium: 8.9 mg/dL (ref 8.9–10.3)
Chloride: 102 mmol/L (ref 101–111)
GFR calc Af Amer: >60 mL/min (ref >60)
GLUCOSE: 140 mg/dL — AB (ref 65–99)
POTASSIUM: 3.1 mmol/L — AB (ref 3.5–5.1)
Sodium: 138 mmol/L (ref 135–145)
TOTAL PROTEIN: 6.5 g/dL (ref 6.5–8.1)
Total Bilirubin: 0.4 mg/dL (ref 0.3–1.2)

## 2014-11-29 LAB — PROTIME-INR
INR: 2 — ABNORMAL HIGH (ref 0.00–1.49)
Prothrombin Time: 22.6 seconds — ABNORMAL HIGH (ref 11.6–15.2)

## 2014-11-29 LAB — URINALYSIS, ROUTINE W REFLEX MICROSCOPIC
GLUCOSE, UA: NEGATIVE mg/dL
HGB URINE DIPSTICK: NEGATIVE
Ketones, ur: 15 mg/dL — AB
Nitrite: NEGATIVE
PH: 5.5 (ref 5.0–8.0)
Protein, ur: NEGATIVE mg/dL
SPECIFIC GRAVITY, URINE: 1.028 (ref 1.005–1.030)
Urobilinogen, UA: 1 mg/dL (ref 0.0–1.0)

## 2014-11-29 LAB — LIPASE, BLOOD: LIPASE: 22 U/L (ref 22–51)

## 2014-11-29 LAB — URINE MICROSCOPIC-ADD ON

## 2014-11-29 LAB — TROPONIN I: Troponin I: <0.03 ng/mL (ref <0.031)

## 2014-11-29 LAB — BRAIN NATRIURETIC PEPTIDE: B Natriuretic Peptide: 17.1 pg/mL (ref 0.0–100.0)

## 2014-11-29 MED ORDER — ONDANSETRON HCL 4 MG/2ML IJ SOLN
4.0000 mg | Freq: Once | INTRAMUSCULAR | Status: AC
  Administered 2014-11-29: 4 mg via INTRAVENOUS
  Filled 2014-11-29: qty 2

## 2014-11-29 MED ORDER — SODIUM CHLORIDE 0.9 % IV BOLUS (SEPSIS)
1000.0000 mL | Freq: Once | INTRAVENOUS | Status: AC
  Administered 2014-11-29: 1000 mL via INTRAVENOUS

## 2014-11-29 MED ORDER — ONDANSETRON 4 MG PO TBDP: 4.0000 mg | ORAL_TABLET | Freq: Three times a day (TID) | ORAL | Status: DC | PRN

## 2014-11-29 NOTE — ED Provider Notes (Signed)
CSN: [id number]     Arrival date & time 11/29/14  1202 History   First MD Initiated Contact with Patient 11/29/14 1206     Chief Complaint  Patient presents with  . Dizziness   STAR RESLER is a 59 y.o. female with a history of GERD, paroxysmal atrial fibrillation, on Coumadin, DVT, and obesity who presents to the emergency department complaining of food poisoning. Patient reports that 3 days ago she started having nausea, vomiting and diarrhea. She reports that her father, and husband also have the same symptoms. The patient reports that her nausea, vomiting diarrhea stopped yesterday. She reports she continues to feel positional lightheadedness and nausea today. She denies having any abdominal pain. She reports having pain in her chest deep inspiration. She denies current chest pain. She reports that she feels shortness of breath with walking but none currently. The patient reports she has a home cardiac monitor and has been having runs of atrial fibrillation with PVCs and PACs according to her monitor. She reports having palpitations earlier this morning when she was in atrial fibrillation. She denies current palpitations. She reports the pain that comes with deep inspiration in her chest began yesterday and is intermittent. Patient has a history of paroxysmal atrial fibrillation. Patient reports that today she has had formed stools this morning. She reports she tolerated a grilled cheese, Gatorade and toast today. She complains of some right-sided low back pain that she rates at a 3 out of 10. She complains of some decreased urination but no other urinary symptoms. She reports a previous abdominal surgical history of a cholecystectomy and inguinal hernia repair. The patient denies fevers, room spinning dizziness, current chest pain, current palpitations, shortness of breath, leg pain, leg swelling, dysuria, urinary frequency, urinary urgency, hematuria, hematochezia, hematemesis, abdominal pain,  coughing, wheezing, or rashes.   (Consider location/radiation/quality/duration/timing/severity/associated sxs/prior Treatment) HPI  Past Medical History  Diagnosis Date  . Allergic rhinitis   . Hypercholesteremia   . GERD (gastroesophageal reflux disease)   . Asthma   . PAF (paroxysmal atrial fibrillation)     a. s/p afib ablation at [facility] 2010 (Dr. Annabell Howells). b. Recurrent AF s/p DCCV 06/2010. c. On flecainide.   . Rheumatoid arthritis(714.0)   . OSA (obstructive sleep apnea)     a. Mild, did not tolerate OSA.  . Obesity   . Atrial flutter     a. Remotely per notes.  . Wandering (atrial) pacemaker     a. Remotely per notes.  Marland Kitchen DVT (deep venous thrombosis)     a. 1980s x2 - between birth of two sons. Saw hematology - was told she has a hypercoagulable disorder, should be on Coumadin lifelong.  . Halothane adverse reaction     narrow small opening  . PONV (postoperative nausea and vomiting)   . Colon polyps   . Normal cardiac stress test 06/2012  . Hyperglycemia     a. A1c 6.1 in 2014.   Past Surgical History  Procedure Laterality Date  . Cesarean section    . Carpal tunnel release Bilateral   . Inguinal hernia repair Right   . Cholecystectomy    . Tmj arthroplasty    . Afib ablation  2010    at [place] History  Problem Relation Age of Onset  . Lupus Sister   . Stroke Mother   . Heart attack Mother     2 previous MIs, 3 stents - CAD first diagnosed 29  . Heart  attack Paternal Grandfather     Died at 29 of massive MI  . Stroke Maternal Grandfather     Multiple family members  . Thyroid disease Mother   . Colon polyps Father   . Stroke Paternal Grandmother   . Thyroid disease Sister     x 2   Social History  Substance Use Topics  . Smoking status: Never Smoker   . Smokeless tobacco: Never Used  . Alcohol Use: Yes     Comment: occasional beer - 1x/month   OB History    No data available     Review of Systems  Constitutional:  Positive for appetite change. Negative for fever.  HENT: Negative for congestion and sore throat.   Eyes: Negative for visual disturbance.  Respiratory: Negative for cough, shortness of breath and wheezing.   Cardiovascular: Positive for chest pain and palpitations. Negative for leg swelling.  Gastrointestinal: Positive for nausea, vomiting and diarrhea. Negative for abdominal pain and blood in stool.  Genitourinary: Positive for decreased urine volume. Negative for dysuria, urgency, frequency, hematuria, flank pain and difficulty urinating.  Musculoskeletal: Positive for back pain. Negative for gait problem and neck pain.  Skin: Negative for rash.  Neurological: Positive for light-headedness. Negative for dizziness, syncope, weakness, numbness and headaches.      Allergies  Aspirin; Ivp dye; Latex; Meperidine hcl; Penicillins; Statins; and Sulfonamide derivatives  Home Medications   Prior to Admission medications   Medication Sig Start Date End Date Taking? Authorizing Provider  ASMANEX 60 METERED DOSES 220 MCG/INH inhaler Inhale 1 puff into the lungs 2 (two) times daily.  04/03/10  Yes Historical Provider, MD  cetirizine (ZYRTEC) 10 MG tablet Take 10 mg by mouth daily.     Yes Historical Provider, MD  Cholecalciferol (VITAMIN D3) 1000 UNITS CAPS Take 1 capsule by mouth daily.    Yes Historical Provider, MD  flecainide (TAMBOCOR) 50 MG tablet Take 1 tablet (50 mg total) by mouth 2 (two) times daily. 06/19/14  Yes Thompson Grayer, MD  fluticasone (FLONASE) 50 MCG/ACT nasal spray Place 1 spray into the nose 2 (two) times daily.  06/02/12  Yes Historical Provider, MD  losartan (COZAAR) 25 MG tablet Take 1 tablet (25 mg total) by mouth daily. 12/20/13  Yes Thompson Grayer, MD  Multiple Vitamin (MULTIVITAMIN) tablet Take 1 tablet by mouth daily.     Yes Historical Provider, MD  omeprazole (PRILOSEC) 40 MG capsule Take 40 mg by mouth daily.  04/12/10  Yes Historical Provider, MD  Tavaborole (KERYDIN) 5  % SOLN Apply one drop to each affected nail once daily for 12 months 12/20/13  Yes Harriet Masson, DPM  venlafaxine XR (EFFEXOR-XR) 37.5 MG 24 hr capsule Take 37.5 mg by mouth every morning. 11/21/12  Yes Historical Provider, MD  verapamil (CALAN) 80 MG tablet Take 1.5 tablets (120 mg total) by mouth 3 (three) times daily. 05/28/14  Yes Thompson Grayer, MD  warfarin (COUMADIN) 2 MG tablet Take 1 tablet (2 mg total) by mouth as directed. 06/19/14  Yes Thompson Grayer, MD  ondansetron (ZOFRAN ODT) 4 MG disintegrating tablet Take 1 tablet (4 mg total) by mouth every 8 (eight) hours as needed for nausea or vomiting. 11/29/14   Waynetta Pean, PA-C  PROAIR HFA 108 (90 BASE) MCG/ACT inhaler Inhale 2 puffs into the lungs every 6 (six) hours as needed for wheezing or shortness of breath.  03/22/12   Historical Provider, MD   BP 130/67 mmHg  Pulse 84  Temp(Src) 98.4 F (36.9  C) (Oral)  Resp 18  Ht 5\' 5"  (1.651 m)  Wt 250 lb (113.399 kg)  BMI 41.60 kg/m2  SpO2 100% Physical Exam  Constitutional: She is oriented to person, place, and time. She appears well-developed and well-nourished. No distress.  Nontoxic appearing.  HENT:  Head: Normocephalic and atraumatic.  Mouth/Throat: Oropharynx is clear and moist.  Eyes: Conjunctivae are normal. Pupils are equal, round, and reactive to light. Right eye exhibits no discharge. Left eye exhibits no discharge.  Neck: Normal range of motion. Neck supple. No JVD present. No tracheal deviation present.  Cardiovascular: Normal rate, regular rhythm, normal heart sounds and intact distal pulses.  Exam reveals no gallop and no friction rub.   No murmur heard. Heart rate is 76. Regular rate and rhythm. Bilateral radial pulses are intact.  Pulmonary/Chest: Effort normal and breath sounds normal. No respiratory distress. She has no wheezes. She has no rales. She exhibits no tenderness.  Lungs are clear to auscultation bilaterally. No chest wall tenderness.  Abdominal: Soft. Bowel  sounds are normal. She exhibits no distension and no mass. There is no tenderness. There is no rebound and no guarding.  Abdomen is soft and nontender to palpation. Bowel sounds are present. No CVA tenderness.  Musculoskeletal: She exhibits no edema or tenderness.  No lower extremity edema or tenderness. The patient is able to ambulate without difficulty or assistance.  Lymphadenopathy:    She has no cervical adenopathy.  Neurological: She is alert and oriented to person, place, and time. Coordination normal.  Sensation is intact to bilateral lower extremities.  Skin: Skin is warm and dry. No rash noted. She is not diaphoretic. No erythema. No pallor.  Psychiatric: She has a normal mood and affect. Her behavior is normal.  Nursing note and vitals reviewed.   ED Course  Procedures (including critical care time) Labs Review Labs Reviewed  COMPREHENSIVE METABOLIC PANEL - Abnormal; Notable for the following:    Potassium 3.1 (*)    Glucose, Bld 140 (*)    Albumin 3.3 (*)    AST 51 (*)    ALT 58 (*)    All other components within normal limits  CBC WITH DIFFERENTIAL/PLATELET - Abnormal; Notable for the following:    Neutrophils Relative % 34 (*)    Neutro Abs 1.4 (*)    Lymphocytes Relative 52 (*)    All other components within normal limits  PROTIME-INR - Abnormal; Notable for the following:    Prothrombin Time 22.6 (*)    INR 2.00 (*)    All other components within normal limits  URINALYSIS, ROUTINE W REFLEX MICROSCOPIC (NOT AT [facility]) - Abnormal; Notable for the following:    Color, Urine AMBER (*)    APPearance CLOUDY (*)    Bilirubin Urine SMALL (*)    Ketones, ur 15 (*)    Leukocytes, UA SMALL (*)    All other components within normal limits  URINE CULTURE  LIPASE, BLOOD  TROPONIN I  BRAIN NATRIURETIC PEPTIDE  URINE MICROSCOPIC-ADD ON    Imaging Review Dg Chest 2 View  11/29/2014   CLINICAL DATA:  Acute chest pain.  EXAM: CHEST  2 VIEW  COMPARISON:  July 17, 2013.   FINDINGS: Stable cardiomediastinal silhouette. No pneumothorax or pleural effusion is noted. Both lungs are clear. The visualized skeletal structures are unremarkable.  IMPRESSION: No active cardiopulmonary disease.   Electronically Signed   By: Marijo Conception, M.D.   On: 11/29/2014 13:25   I have personally reviewed  and evaluated these images and lab results as part of my medical decision-making.   EKG Interpretation   Date/Time:  Thursday November 29 2014 12:20:07 EDT Ventricular Rate:  77 PR Interval:  142 QRS Duration: 88 QT Interval:  404 QTC Calculation: 457 R Axis:   40 Text Interpretation:  Normal sinus rhythm Normal ECG Confirmed by KNOTT  MD, DANIEL ([id number]) on 11/29/2014 12:20:29 PM      Filed Vitals:   11/29/14 1210 11/29/14 1643  BP: 139/61 130/67  Pulse: 81 84  Temp: 98.4 F (36.9 C)   TempSrc: Oral   Resp: 18 18  Height: 5\' 5"  (1.651 m)   Weight: 250 lb (113.399 kg)   SpO2: 100% 100%     MDM   Meds given in ED:  Medications  sodium chloride 0.9 % bolus 1,000 mL (0 mLs Intravenous Stopped 11/29/14 1346)  ondansetron (ZOFRAN) injection 4 mg (4 mg Intravenous Given 11/29/14 1246)  sodium chloride 0.9 % bolus 1,000 mL (0 mLs Intravenous Stopped 11/29/14 1540)    Discharge Medication List as of 11/29/2014  4:40 PM    START taking these medications   Details  ondansetron (ZOFRAN ODT) 4 MG disintegrating tablet Take 1 tablet (4 mg total) by mouth every 8 (eight) hours as needed for nausea or vomiting., Starting 11/29/2014, Until Discontinued, Print        Final diagnoses:  Nausea vomiting and diarrhea  Positional lightheadedness   This is a 59 y.o. female with a history of GERD, paroxysmal atrial fibrillation, on Coumadin, DVT, and obesity who presents to the emergency department complaining of food poisoning. Patient reports that 3 days ago she started having nausea, vomiting and diarrhea. She reports that her father, and husband also have the same symptoms. The  patient reports that her nausea, vomiting diarrhea stopped yesterday. She reports she continues to feel positional lightheadedness and nausea today. She denies having any abdominal pain. She reports having pain in her chest deep inspiration. She denies current chest pain.  She reports having intermittent palpitations with runs of atrial fibrillation on her monitor at home. She denies current palpitations. She reports she has tolerated food today. She reports she's had a formed bowel movement today. On exam the patient is afebrile nontoxic appearing. She reports feeling lightheaded when sitting up in bed. Her abdomen is soft and nontender to palpation. She is not tachypneic, tachycardic or hypoxic. She has no lower extremity edema or tenderness. Patient's urinalysis shows small leukocytes. She denies urinary symptoms. Urine sent for culture. CMP is remarkable for a potassium of 3.1 and mildly elevated AST and ALT. I advised her to have her liver function rechecked by her primary care provider. Her lipase is within normal limits. CBC is unremarkable. No leukocytosis. Troponin is negative. INR is 2.00. BNP is within normal limits. Chest x-ray is unremarkable. After Zofran and fluid bolus patient reports her nausea has completely resolved. She reports she is still feeling lightheaded when sitting up. Patient received another liter fluid bolus and afterwards reports she feels back to normal and does not feel lightheaded or dizzy. Patient ambulated to the bathroom without difficulty or assistance and denies feeling any lightheadedness or dizziness. She denies any pain in her chest with breathing prior to discharge. We'll discharge with prescriptions for Zofran and have her follow up with primary care. Strict return precautions provided. I advised the patient to follow-up with their primary care provider this week. I advised the patient to return to the emergency department with  new or worsening symptoms or new concerns.  The patient verbalized understanding and agreement with plan.    This patient was discussed with Dr. Laneta Simmers who agrees with assessment and plan.    Waynetta Pean, PA-C 11/29/14 2121  Leo Grosser, MD 11/29/14 2122

## 2014-11-29 NOTE — ED Notes (Signed)
Pt reports nausea, vomting and diarhea started on Monday around 1630  And stopped on tues aroun 1400. Pt reports that she has been dizzy and nauseated since, but no vomiting or diarrhea, did have formed stool this am, states she has been in and out of afib with burst of pvc and pac at home, she states she has a home cardiac monitoring

## 2014-11-29 NOTE — Discharge Instructions (Signed)
Nausea and Vomiting Nausea is a sick feeling that often comes before throwing up (vomiting). Vomiting is a reflex where stomach contents come out of your mouth. Vomiting can cause severe loss of body fluids (dehydration). Children and elderly adults can become dehydrated quickly, especially if they also have diarrhea. Nausea and vomiting are symptoms of a condition or disease. It is important to find the cause of your symptoms. CAUSES   Direct irritation of the stomach lining. This irritation can result from increased acid production (gastroesophageal reflux disease), infection, food poisoning, taking certain medicines (such as nonsteroidal anti-inflammatory drugs), alcohol use, or tobacco use.  Signals from the brain.These signals could be caused by a headache, heat exposure, an inner ear disturbance, increased pressure in the brain from injury, infection, a tumor, or a concussion, pain, emotional stimulus, or metabolic problems.  An obstruction in the gastrointestinal tract (bowel obstruction).  Illnesses such as diabetes, hepatitis, gallbladder problems, appendicitis, kidney problems, cancer, sepsis, atypical symptoms of a heart attack, or eating disorders.  Medical treatments such as chemotherapy and radiation.  Receiving medicine that makes you sleep (general anesthetic) during surgery. DIAGNOSIS Your caregiver may ask for tests to be done if the problems do not improve after a few days. Tests may also be done if symptoms are severe or if the reason for the nausea and vomiting is not clear. Tests may include:  Urine tests.  Blood tests.  Stool tests.  Cultures (to look for evidence of infection).  X-rays or other imaging studies. Test results can help your caregiver make decisions about treatment or the need for additional tests. TREATMENT You need to stay well hydrated. Drink frequently but in small amounts.You may wish to drink water, sports drinks, clear broth, or eat frozen  ice pops or gelatin dessert to help stay hydrated.When you eat, eating slowly may help prevent nausea.There are also some antinausea medicines that may help prevent nausea. HOME CARE INSTRUCTIONS   Take all medicine as directed by your caregiver.  If you do not have an appetite, do not force yourself to eat. However, you must continue to drink fluids.  If you have an appetite, eat a normal diet unless your caregiver tells you differently.  Eat a variety of complex carbohydrates (rice, wheat, potatoes, bread), lean meats, yogurt, fruits, and vegetables.  Avoid high-fat foods because they are more difficult to digest.  Drink enough water and fluids to keep your urine clear or pale yellow.  If you are dehydrated, ask your caregiver for specific rehydration instructions. Signs of dehydration may include:  Severe thirst.  Dry lips and mouth.  Dizziness.  Dark urine.  Decreasing urine frequency and amount.  Confusion.  Rapid breathing or pulse. SEEK IMMEDIATE MEDICAL CARE IF:   You have blood or brown flecks (like coffee grounds) in your vomit.  You have black or bloody stools.  You have a severe headache or stiff neck.  You are confused.  You have severe abdominal pain.  You have chest pain or trouble breathing.  You do not urinate at least once every 8 hours.  You develop cold or clammy skin.  You continue to vomit for longer than 24 to 48 hours.  You have a fever. MAKE SURE YOU:   Understand these instructions.  Will watch your condition.  Will get help right away if you are not doing well or get worse. Document Released: 03/09/2005 Document Revised: 06/01/2011 Document Reviewed: 08/06/2010 [employer] Patient Information 2015 [employer]. This information is not intended   to replace advice given to you by your health care provider. Make sure you discuss any questions you have with your health care provider. Diarrhea Diarrhea is frequent loose and watery  bowel movements. It can cause you to feel weak and dehydrated. Dehydration can cause you to become tired and thirsty, have a dry mouth, and have decreased urination that often is dark yellow. Diarrhea is a sign of another problem, most often an infection that will not last long. In most cases, diarrhea typically lasts 2-3 days. However, it can last longer if it is a sign of something more serious. It is important to treat your diarrhea as directed by your caregiver to lessen or prevent future episodes of diarrhea. CAUSES  Some common causes include:  Gastrointestinal infections caused by viruses, bacteria, or parasites.  Food poisoning or food allergies.  Certain medicines, such as antibiotics, chemotherapy, and laxatives.  Artificial sweeteners and fructose.  Digestive disorders. HOME CARE INSTRUCTIONS  Ensure adequate fluid intake (hydration): Have 1 cup (8 oz) of fluid for each diarrhea episode. Avoid fluids that contain simple sugars or sports drinks, fruit juices, whole milk products, and sodas. Your urine should be clear or pale yellow if you are drinking enough fluids. Hydrate with an oral rehydration solution that you can purchase at pharmacies, retail stores, and online. You can prepare an oral rehydration solution at home by mixing the following ingredients together:   - tsp table salt.   tsp baking soda.   tsp salt substitute containing potassium chloride.  1  tablespoons sugar.  1 L (34 oz) of water.  Certain foods and beverages may increase the speed at which food moves through the gastrointestinal (GI) tract. These foods and beverages should be avoided and include:  Caffeinated and alcoholic beverages.  High-fiber foods, such as raw fruits and vegetables, nuts, seeds, and whole grain breads and cereals.  Foods and beverages sweetened with sugar alcohols, such as xylitol, sorbitol, and mannitol.  Some foods may be well tolerated and may help thicken stool  including:  Starchy foods, such as rice, toast, pasta, low-sugar cereal, oatmeal, grits, baked potatoes, crackers, and bagels.  Bananas.  Applesauce.  Add probiotic-rich foods to help increase healthy bacteria in the GI tract, such as yogurt and fermented milk products.  Wash your hands well after each diarrhea episode.  Only take over-the-counter or prescription medicines as directed by your caregiver.  Take a warm bath to relieve any burning or pain from frequent diarrhea episodes. SEEK IMMEDIATE MEDICAL CARE IF:   You are unable to keep fluids down.  You have persistent vomiting.  You have blood in your stool, or your stools are black and tarry.  You do not urinate in 6-8 hours, or there is only a small amount of very dark urine.  You have abdominal pain that increases or localizes.  You have weakness, dizziness, confusion, or light-headedness.  You have a severe headache.  Your diarrhea gets worse or does not get better.  You have a fever or persistent symptoms for more than 2-3 days.  You have a fever and your symptoms suddenly get worse. MAKE SURE YOU:   Understand these instructions.  Will watch your condition.  Will get help right away if you are not doing well or get worse. Document Released: 02/27/2002 Document Revised: 07/24/2013 Document Reviewed: 11/15/2011 [facility] Patient Information 2015 [place], [place]. This information is not intended to replace advice given to you by your health care provider. Make sure you discuss any  questions you have with your health care provider. Dizziness Dizziness is a common problem. It is a feeling of unsteadiness or light-headedness. You may feel like you are about to faint. Dizziness can lead to injury if you stumble or fall. A person of any age group can suffer from dizziness, but dizziness is more common in older adults. CAUSES  Dizziness can be caused by many different things, including:  Middle ear  problems.  Standing for too long.  Infections.  An allergic reaction.  Aging.  An emotional response to something, such as the sight of blood.  Side effects of medicines.  Tiredness.  Problems with circulation or blood pressure.  Excessive use of alcohol or medicines, or illegal drug use.  Breathing too fast (hyperventilation).  An irregular heart rhythm (arrhythmia).  A low red blood cell count (anemia).  Pregnancy.  Vomiting, diarrhea, fever, or other illnesses that cause body fluid loss (dehydration).  Diseases or conditions such as Parkinson's disease, high blood pressure (hypertension), diabetes, and thyroid problems.  Exposure to extreme heat. DIAGNOSIS  Your health care provider will ask about your symptoms, perform a physical exam, and perform an electrocardiogram (ECG) to record the electrical activity of your heart. Your health care provider may also perform other heart or blood tests to determine the cause of your dizziness. These may include:  Transthoracic echocardiogram (TTE). During echocardiography, sound waves are used to evaluate how blood flows through your heart.  Transesophageal echocardiogram (TEE).  Cardiac monitoring. This allows your health care provider to monitor your heart rate and rhythm in real time.  Holter monitor. This is a portable device that records your heartbeat and can help diagnose heart arrhythmias. It allows your health care provider to track your heart activity for several days if needed.  Stress tests by exercise or by giving medicine that makes the heart beat faster. TREATMENT  Treatment of dizziness depends on the cause of your symptoms and can vary greatly. HOME CARE INSTRUCTIONS   Drink enough fluids to keep your urine clear or pale yellow. This is especially important in very hot weather. In older adults, it is also important in cold weather.  Take your medicine exactly as directed if your dizziness is caused by  medicines. When taking blood pressure medicines, it is especially important to get up slowly.  Rise slowly from chairs and steady yourself until you feel okay.  In the morning, first sit up on the side of the bed. When you feel okay, stand slowly while holding onto something until you know your balance is fine.  Move your legs often if you need to stand in one place for a long time. Tighten and relax your muscles in your legs while standing.  Have someone stay with you for 1-2 days if dizziness continues to be a problem. Do this until you feel you are well enough to stay alone. Have the person call your health care provider if he or she notices changes in you that are concerning.  Do not drive or use heavy machinery if you feel dizzy.  Do not drink alcohol. SEEK IMMEDIATE MEDICAL CARE IF:   Your dizziness or light-headedness gets worse.  You feel nauseous or vomit.  You have problems talking, walking, or using your arms, hands, or legs.  You feel weak.  You are not thinking clearly or you have trouble forming sentences. It may take a friend or family member to notice this.  You have chest pain, abdominal pain, shortness of breath,  or sweating.  Your vision changes.  You notice any bleeding.  You have side effects from medicine that seems to be getting worse rather than better. MAKE SURE YOU:   Understand these instructions.  Will watch your condition.  Will get help right away if you are not doing well or get worse. Document Released: 09/02/2000 Document Revised: 03/14/2013 Document Reviewed: 09/26/2010 [facility] Patient Information 2015 [place], [place]. This information is not intended to replace advice given to you by your health care provider. Make sure you discuss any questions you have with your health care provider. Chest Pain (Nonspecific) It is often hard to give a specific diagnosis for the cause of chest pain. There is always a chance that your pain could be related  to something serious, such as a heart attack or a blood clot in the lungs. You need to follow up with your health care provider for further evaluation. CAUSES   Heartburn.  Pneumonia or bronchitis.  Anxiety or stress.  Inflammation around your heart (pericarditis) or lung (pleuritis or pleurisy).  A blood clot in the lung.  A collapsed lung (pneumothorax). It can develop suddenly on its own (spontaneous pneumothorax) or from trauma to the chest.  Shingles infection (herpes zoster virus). The chest wall is composed of bones, muscles, and cartilage. Any of these can be the source of the pain.  The bones can be bruised by injury.  The muscles or cartilage can be strained by coughing or overwork.  The cartilage can be affected by inflammation and become sore (costochondritis). DIAGNOSIS  Lab tests or other studies may be needed to find the cause of your pain. Your health care provider may have you take a test called an ambulatory electrocardiogram (ECG). An ECG records your heartbeat patterns over a 24-hour period. You may also have other tests, such as:  Transthoracic echocardiogram (TTE). During echocardiography, sound waves are used to evaluate how blood flows through your heart.  Transesophageal echocardiogram (TEE).  Cardiac monitoring. This allows your health care provider to monitor your heart rate and rhythm in real time.  Holter monitor. This is a portable device that records your heartbeat and can help diagnose heart arrhythmias. It allows your health care provider to track your heart activity for several days, if needed.  Stress tests by exercise or by giving medicine that makes the heart beat faster. TREATMENT   Treatment depends on what may be causing your chest pain. Treatment may include:  Acid blockers for heartburn.  Anti-inflammatory medicine.  Pain medicine for inflammatory conditions.  Antibiotics if an infection is present.  You may be advised to change  lifestyle habits. This includes stopping smoking and avoiding alcohol, caffeine, and chocolate.  You may be advised to keep your head raised (elevated) when sleeping. This reduces the chance of acid going backward from your stomach into your esophagus. Most of the time, nonspecific chest pain will improve within 2-3 days with rest and mild pain medicine.  HOME CARE INSTRUCTIONS   If antibiotics were prescribed, take them as directed. Finish them even if you start to feel better.  For the next few days, avoid physical activities that bring on chest pain. Continue physical activities as directed.  Do not use any tobacco products, including cigarettes, chewing tobacco, or electronic cigarettes.  Avoid drinking alcohol.  Only take medicine as directed by your health care provider.  Follow your health care provider's suggestions for further testing if your chest pain does not go away.  Keep any follow-up  appointments you made. If you do not go to an appointment, you could develop lasting (chronic) problems with pain. If there is any problem keeping an appointment, call to reschedule. SEEK MEDICAL CARE IF:   Your chest pain does not go away, even after treatment.  You have a rash with blisters on your chest.  You have a fever. SEEK IMMEDIATE MEDICAL CARE IF:   You have increased chest pain or pain that spreads to your arm, neck, jaw, back, or abdomen.  You have shortness of breath.  You have an increasing cough, or you cough up blood.  You have severe back or abdominal pain.  You feel nauseous or vomit.  You have severe weakness.  You faint.  You have chills. This is an emergency. Do not wait to see if the pain will go away. Get medical help at once. Call your local emergency services (911 in [place] Do not drive yourself to the hospital. MAKE SURE YOU:   Understand these instructions.  Will watch your condition.  Will get help right away if you are not doing well or get  worse. Document Released: 12/17/2004 Document Revised: 03/14/2013 Document Reviewed: 10/13/2007 [facility] Patient Information 2015 [place], [place]. This information is not intended to replace advice given to you by your health care provider. Make sure you discuss any questions you have with your health care provider.

## 2014-12-02 LAB — URINE CULTURE: Culture: 40000

## 2014-12-03 ENCOUNTER — Telehealth (HOSPITAL_COMMUNITY): Payer: Self-pay

## 2014-12-03 ENCOUNTER — Ambulatory Visit (INDEPENDENT_AMBULATORY_CARE_PROVIDER_SITE_OTHER): Payer: 59 | Admitting: Cardiovascular Disease

## 2014-12-03 DIAGNOSIS — I480 Paroxysmal atrial fibrillation: Secondary | ICD-10-CM

## 2014-12-03 DIAGNOSIS — Z5181 Encounter for therapeutic drug level monitoring: Secondary | ICD-10-CM

## 2014-12-03 NOTE — Progress Notes (Signed)
ED Antimicrobial Stewardship Positive Culture Follow Up   [name] is an 59 y.o. female who presented to [facility] on 11/29/2014 with a chief complaint of  Chief Complaint  Patient presents with  . Dizziness    Recent Results (from the past 720 hour(s))  Urine culture     Status: None   Collection Time: 11/29/14  1:00 PM  Result Value Ref Range Status   Specimen Description URINE, CLEAN CATCH  Final   Special Requests NONE  Final   Culture   Final    40,000 COLONIES/ml ESCHERICHIA COLI >=100,000 COLONIES/mL ENTEROCOCCUS SPECIES Performed at [facility]    Report Status 12/02/2014 FINAL  Final   Organism ID, Bacteria ESCHERICHIA COLI  Final   Organism ID, Bacteria ENTEROCOCCUS SPECIES  Final      Susceptibility   Escherichia coli - MIC*    AMPICILLIN >=32 RESISTANT Resistant     CEFAZOLIN >=64 RESISTANT Resistant     CEFTRIAXONE <=1 SENSITIVE Sensitive     CIPROFLOXACIN <=0.25 SENSITIVE Sensitive     GENTAMICIN <=1 SENSITIVE Sensitive     IMIPENEM <=0.25 SENSITIVE Sensitive     NITROFURANTOIN <=16 SENSITIVE Sensitive     TRIMETH/SULFA <=20 SENSITIVE Sensitive     AMPICILLIN/SULBACTAM 16 INTERMEDIATE Intermediate     PIP/TAZO <=4 SENSITIVE Sensitive     * 40,000 COLONIES/ml ESCHERICHIA COLI   Enterococcus species - MIC*    AMPICILLIN <=2 SENSITIVE Sensitive     LEVOFLOXACIN 1 SENSITIVE Sensitive     NITROFURANTOIN <=16 SENSITIVE Sensitive     VANCOMYCIN 1 SENSITIVE Sensitive     * >=100,000 COLONIES/mL ENTEROCOCCUS SPECIES    59 yo f presenting with nausea, vomiting, and diarrhea associated with food poisoning.  Patient does not have any urinary symptoms, fevers, or leukocytosis, no treatment indicated.  ED Provider: Zacarias Pontes, PA-C   Haim Hansson L. Nicole Kindred  12/03/2014, 9:04 AM Infectious Diseases Pharmacist Phone# [phone]

## 2014-12-03 NOTE — Telephone Encounter (Signed)
Post ED Visit - Positive Culture Follow-up  Culture report reviewed by antimicrobial stewardship pharmacist:  []  Heide Guile, Pharm.D., BCPS []  Alycia Rossetti, Pharm.D., BCPS []  [place], [place].D., BCPS, AAHIVP []  Legrand Como, Pharm.D., BCPS, AAHIVP []  [place], Pharm.D. [x]  Milus Glazier, [place].D.  Positive urine culture Chart reviewed by Dewitt Hoes camprobi-soms PA-C no further patient follow-up is required at this time.  Ileene Musa 12/03/2014, 9:11 AM

## 2014-12-07 ENCOUNTER — Other Ambulatory Visit: Payer: Self-pay | Admitting: Internal Medicine

## 2014-12-12 ENCOUNTER — Other Ambulatory Visit: Payer: Self-pay | Admitting: Internal Medicine

## 2015-01-02 ENCOUNTER — Ambulatory Visit (INDEPENDENT_AMBULATORY_CARE_PROVIDER_SITE_OTHER): Payer: 59 | Admitting: *Deleted

## 2015-01-02 DIAGNOSIS — I480 Paroxysmal atrial fibrillation: Secondary | ICD-10-CM

## 2015-01-02 DIAGNOSIS — I4891 Unspecified atrial fibrillation: Secondary | ICD-10-CM | POA: Diagnosis not present

## 2015-01-02 DIAGNOSIS — Z5181 Encounter for therapeutic drug level monitoring: Secondary | ICD-10-CM

## 2015-01-02 LAB — POCT INR: INR: 2.8

## 2015-02-13 ENCOUNTER — Ambulatory Visit (INDEPENDENT_AMBULATORY_CARE_PROVIDER_SITE_OTHER): Payer: 59 | Admitting: Pharmacist

## 2015-02-13 DIAGNOSIS — I480 Paroxysmal atrial fibrillation: Secondary | ICD-10-CM | POA: Diagnosis not present

## 2015-02-13 DIAGNOSIS — I4891 Unspecified atrial fibrillation: Secondary | ICD-10-CM | POA: Diagnosis not present

## 2015-02-13 DIAGNOSIS — Z5181 Encounter for therapeutic drug level monitoring: Secondary | ICD-10-CM

## 2015-02-13 LAB — POCT INR: INR: 2.4

## 2015-02-28 ENCOUNTER — Other Ambulatory Visit: Payer: Self-pay | Admitting: Sports Medicine

## 2015-02-28 DIAGNOSIS — M5136 Other intervertebral disc degeneration, lumbar region: Secondary | ICD-10-CM

## 2015-03-04 ENCOUNTER — Ambulatory Visit
Admission: RE | Admit: 2015-03-04 | Discharge: 2015-03-04 | Disposition: A | Discharge status: Home / Self Care | Payer: 59 | Source: Ambulatory Visit | Attending: Sports Medicine | Admitting: Sports Medicine

## 2015-03-04 DIAGNOSIS — M5136 Other intervertebral disc degeneration, lumbar region: Secondary | ICD-10-CM

## 2015-03-13 ENCOUNTER — Other Ambulatory Visit: Payer: Self-pay | Admitting: Internal Medicine

## 2015-03-13 NOTE — Telephone Encounter (Signed)
Thompson Grayer, MD at 05/28/2014 11:15 PM  flecainide (TAMBOCOR) 50 MG tabletTake 50 mg by mouth 2 (two) times daily ASSESSMENT AND PLAN:  1. afib Stable though she has had more frequent atrial ectopy Increase diltiazem to 120mg  TID Consider increasing flecainide if symptoms worsen. We discussed multaq and tikosyn as alternatives. Continue long term anticoagulation

## 2015-03-19 ENCOUNTER — Telehealth: Payer: Self-pay | Admitting: Internal Medicine

## 2015-03-19 NOTE — Telephone Encounter (Signed)
New message  Request for surgical clearance:  1. What type of surgery is being performed? Epideral Steroid injection   2. When is this surgery scheduled? Not scheduled yet   3. Are there any medications that need to be held prior to surgery and how long?Coumdain at the Doctors discretion   4. Name of physician performing surgery? Welsy I bazedo   5. What is your office phone and fax number? Fax # [phone] 6. Comments: Clearance was faxed on 03/13/2015

## 2015-03-19 NOTE — Telephone Encounter (Signed)
Pt has a history of multiple DVT and hypercoagulable state.  Will need Lovenox bridge.  Clearance sent earlier today.

## 2015-03-20 ENCOUNTER — Ambulatory Visit (INDEPENDENT_AMBULATORY_CARE_PROVIDER_SITE_OTHER): Payer: 59 | Admitting: Pharmacist

## 2015-03-20 DIAGNOSIS — Z5181 Encounter for therapeutic drug level monitoring: Secondary | ICD-10-CM

## 2015-03-20 DIAGNOSIS — I480 Paroxysmal atrial fibrillation: Secondary | ICD-10-CM

## 2015-03-20 DIAGNOSIS — I4891 Unspecified atrial fibrillation: Secondary | ICD-10-CM

## 2015-03-20 LAB — POCT INR: INR: 2.5

## 2015-03-20 MED ORDER — ENOXAPARIN SODIUM 120 MG/0.8ML ~~LOC~~ SOLN: 120.0000 mg | Freq: Two times a day (BID) | SUBCUTANEOUS | Status: DC

## 2015-03-20 NOTE — Patient Instructions (Addendum)
12/29 - Last dose of Coumadin 12/30 - No Coumadin or Lovenox 12/31 - Inject Lovenox 120mg  into the fatty tissue of the stomach twice a day 1/1 - Inject Lovenox 120mg  twice a day 1/2 - Inject Lovenox 120mg  twice a day 1/3 - Inject Lovenox 120mg  ONLY in the morning - recheck INR for procedure 1/4 - Procedure - No Lovenox today, restart Coumadin at usual dose unless otherwise directed by doctor 1/5 - Take Coumadin and inject Lovenox 120mg  twice a day 1/6 - Take Coumadin and inject Lovenox 120mg  twice a day 1/7 - Take Coumadin and inject Lovenox 120mg  twice a day 1/8 - Take Coumadin and inject Lovenox 120mg  twice a day 1/9 - Recheck INR

## 2015-03-21 ENCOUNTER — Encounter: Payer: Self-pay | Admitting: Internal Medicine

## 2015-03-26 ENCOUNTER — Ambulatory Visit (INDEPENDENT_AMBULATORY_CARE_PROVIDER_SITE_OTHER): Payer: 59 | Admitting: Pharmacist

## 2015-03-26 DIAGNOSIS — I480 Paroxysmal atrial fibrillation: Secondary | ICD-10-CM | POA: Diagnosis not present

## 2015-03-26 DIAGNOSIS — I4891 Unspecified atrial fibrillation: Secondary | ICD-10-CM

## 2015-03-26 DIAGNOSIS — Z5181 Encounter for therapeutic drug level monitoring: Secondary | ICD-10-CM

## 2015-03-26 LAB — POCT INR: INR: 1

## 2015-03-27 DIAGNOSIS — M5106 Intervertebral disc disorders with myelopathy, lumbar region: Secondary | ICD-10-CM | POA: Diagnosis not present

## 2015-04-04 ENCOUNTER — Ambulatory Visit (INDEPENDENT_AMBULATORY_CARE_PROVIDER_SITE_OTHER): Payer: 59 | Admitting: *Deleted

## 2015-04-04 DIAGNOSIS — I10 Essential (primary) hypertension: Secondary | ICD-10-CM | POA: Diagnosis not present

## 2015-04-04 DIAGNOSIS — I4891 Unspecified atrial fibrillation: Secondary | ICD-10-CM

## 2015-04-04 DIAGNOSIS — Z5181 Encounter for therapeutic drug level monitoring: Secondary | ICD-10-CM

## 2015-04-04 DIAGNOSIS — R600 Localized edema: Secondary | ICD-10-CM | POA: Diagnosis not present

## 2015-04-04 DIAGNOSIS — J309 Allergic rhinitis, unspecified: Secondary | ICD-10-CM | POA: Diagnosis not present

## 2015-04-04 DIAGNOSIS — K219 Gastro-esophageal reflux disease without esophagitis: Secondary | ICD-10-CM | POA: Diagnosis not present

## 2015-04-04 DIAGNOSIS — J3489 Other specified disorders of nose and nasal sinuses: Secondary | ICD-10-CM | POA: Diagnosis not present

## 2015-04-04 DIAGNOSIS — E782 Mixed hyperlipidemia: Secondary | ICD-10-CM | POA: Diagnosis not present

## 2015-04-04 DIAGNOSIS — N951 Menopausal and female climacteric states: Secondary | ICD-10-CM | POA: Diagnosis not present

## 2015-04-04 DIAGNOSIS — J45909 Unspecified asthma, uncomplicated: Secondary | ICD-10-CM | POA: Diagnosis not present

## 2015-04-04 DIAGNOSIS — I480 Paroxysmal atrial fibrillation: Secondary | ICD-10-CM

## 2015-04-04 LAB — POCT INR: INR: 1.8

## 2015-04-05 DIAGNOSIS — M1611 Unilateral primary osteoarthritis, right hip: Secondary | ICD-10-CM | POA: Diagnosis not present

## 2015-04-05 DIAGNOSIS — M5106 Intervertebral disc disorders with myelopathy, lumbar region: Secondary | ICD-10-CM | POA: Diagnosis not present

## 2015-04-10 ENCOUNTER — Other Ambulatory Visit (HOSPITAL_COMMUNITY): Payer: Self-pay | Admitting: Sports Medicine

## 2015-04-10 DIAGNOSIS — M1611 Unilateral primary osteoarthritis, right hip: Secondary | ICD-10-CM | POA: Diagnosis not present

## 2015-04-10 DIAGNOSIS — M25551 Pain in right hip: Secondary | ICD-10-CM

## 2015-04-18 ENCOUNTER — Ambulatory Visit (INDEPENDENT_AMBULATORY_CARE_PROVIDER_SITE_OTHER): Payer: 59 | Admitting: *Deleted

## 2015-04-18 DIAGNOSIS — Z5181 Encounter for therapeutic drug level monitoring: Secondary | ICD-10-CM

## 2015-04-18 DIAGNOSIS — I480 Paroxysmal atrial fibrillation: Secondary | ICD-10-CM | POA: Diagnosis not present

## 2015-04-18 DIAGNOSIS — I4891 Unspecified atrial fibrillation: Secondary | ICD-10-CM | POA: Diagnosis not present

## 2015-04-18 LAB — POCT INR: INR: 2.9

## 2015-04-19 ENCOUNTER — Other Ambulatory Visit: Payer: Self-pay | Admitting: Physician Assistant

## 2015-04-22 ENCOUNTER — Telehealth: Payer: Self-pay | Admitting: *Deleted

## 2015-04-22 NOTE — Telephone Encounter (Signed)
Pt calls with follow up information from her last visit on 04/18/15. She states she originally thought she would not have to hold her Coumadin but office called her on Friday and they do want her off her MRA for 4 days. Thus, she took her last dose on Saturday of Coumadin, did nothing on Sunday. Started Lovenox 120mg  today for every 12 hour dose schedule. She is aware to take last dose on 04/25/15 in the AM and not PM dosage. When she start Coumadin take an extra 1/2 tablet for 2 days then regular dose and restart Lovenox usually next day after procedure if okay'd with MD doing MRA. Continue both until follow on 05/01/15. Call us with any questions or med changes. Pt states will does have to do pre-procedure Prednisone day; 13 hrs, 7hrs and 1hr before, and I verified that she does have enough lovenox at home.

## 2015-04-26 ENCOUNTER — Ambulatory Visit (HOSPITAL_COMMUNITY)
Admission: RE | Admit: 2015-04-26 | Discharge: 2015-04-26 | Disposition: A | Discharge status: Home / Self Care | Payer: 59 | Source: Ambulatory Visit | Attending: Sports Medicine | Admitting: Sports Medicine

## 2015-04-26 DIAGNOSIS — M25551 Pain in right hip: Secondary | ICD-10-CM | POA: Insufficient documentation

## 2015-04-26 DIAGNOSIS — X58XXXA Exposure to other specified factors, initial encounter: Secondary | ICD-10-CM | POA: Insufficient documentation

## 2015-04-26 DIAGNOSIS — S76811A Strain of other specified muscles, fascia and tendons at thigh level, right thigh, initial encounter: Secondary | ICD-10-CM | POA: Insufficient documentation

## 2015-04-26 LAB — PROTIME-INR
INR: 0.98 (ref 0.00–1.49)
Prothrombin Time: 13.2 seconds (ref 11.6–15.2)

## 2015-04-26 MED ORDER — GADOBENATE DIMEGLUMINE 529 MG/ML IV SOLN
5.0000 mL | Freq: Once | INTRAVENOUS | Status: AC | PRN
  Administered 2015-04-26: 0.1 mL via INTRA_ARTICULAR

## 2015-04-26 MED ORDER — IOHEXOL 180 MG/ML  SOLN
20.0000 mL | Freq: Once | INTRAMUSCULAR | Status: AC | PRN
  Administered 2015-04-26: 20 mL via INTRA_ARTICULAR

## 2015-04-26 MED ORDER — LIDOCAINE HCL (PF) 1 % IJ SOLN
INTRAMUSCULAR | Status: AC
  Administered 2015-04-26: 5 mL via INTRAMUSCULAR
  Filled 2015-04-26: qty 5

## 2015-04-26 MED ORDER — LIDOCAINE HCL (PF) 1 % IJ SOLN
INTRAMUSCULAR | Status: AC
  Administered 2015-04-26: 5 mL via INTRA_ARTICULAR
  Filled 2015-04-26: qty 5

## 2015-04-30 DIAGNOSIS — M1611 Unilateral primary osteoarthritis, right hip: Secondary | ICD-10-CM | POA: Diagnosis not present

## 2015-05-01 ENCOUNTER — Telehealth: Payer: Self-pay | Admitting: *Deleted

## 2015-05-01 ENCOUNTER — Ambulatory Visit (INDEPENDENT_AMBULATORY_CARE_PROVIDER_SITE_OTHER): Payer: 59 | Admitting: *Deleted

## 2015-05-01 ENCOUNTER — Encounter: Payer: Self-pay | Admitting: Interventional Cardiology

## 2015-05-01 DIAGNOSIS — Z5181 Encounter for therapeutic drug level monitoring: Secondary | ICD-10-CM

## 2015-05-01 DIAGNOSIS — I480 Paroxysmal atrial fibrillation: Secondary | ICD-10-CM

## 2015-05-01 DIAGNOSIS — I4891 Unspecified atrial fibrillation: Secondary | ICD-10-CM

## 2015-05-01 LAB — POCT INR: INR: 1.7

## 2015-05-01 MED ORDER — ENOXAPARIN SODIUM 120 MG/0.8ML ~~LOC~~ SOLN: 120.0000 mg | Freq: Two times a day (BID) | SUBCUTANEOUS | Status: DC

## 2015-05-01 NOTE — Telephone Encounter (Signed)
Patient was seen in the CVRR clinic today and I was called to see patient for PAC's, PVC's and CP.  We placed patient in room and got her EKG ([id number]).  She says she had Arthrogram of her right hip on Fri and was given 100mg  po of Prednisone due to contrast allergy.  Starting Mon she began having these symptoms.  She has an [employer] app for her phone which she says shows PVC's.  She was due to follow up in afib clinic in 08/2014 but says was unable to reach to set up appointment.  Today she does not seem in any distress, VS stable.  Her BP 140/98 HR 74.  Only intermittent CP with radiation into left shoulder and jaw.  Comes and goes no pain at present.  Discussed with DOD, Dr. Tamala Julian who advised patient to be seen in afib clinic or by PA.  No afib clinic appointments available for this week.  I have set her up to see Tommye Standard, PA Tomorrow morning at 8am.  Patient aware.  If symptoms worsen she will go to the ER.

## 2015-05-02 ENCOUNTER — Encounter: Payer: Self-pay | Admitting: Physician Assistant

## 2015-05-02 ENCOUNTER — Ambulatory Visit (INDEPENDENT_AMBULATORY_CARE_PROVIDER_SITE_OTHER): Payer: 59 | Admitting: Physician Assistant

## 2015-05-02 VITALS — BP 132/76 | HR 82 | Ht 65.0 in | Wt 240.8 lb

## 2015-05-02 DIAGNOSIS — R002 Palpitations: Secondary | ICD-10-CM | POA: Diagnosis not present

## 2015-05-02 DIAGNOSIS — I480 Paroxysmal atrial fibrillation: Secondary | ICD-10-CM | POA: Diagnosis not present

## 2015-05-02 DIAGNOSIS — R079 Chest pain, unspecified: Secondary | ICD-10-CM | POA: Diagnosis not present

## 2015-05-02 LAB — BASIC METABOLIC PANEL
BUN: 14 mg/dL (ref 7–25)
CO2: 27 mmol/L (ref 20–31)
CREATININE: 0.71 mg/dL (ref 0.50–0.99)
Calcium: 9.3 mg/dL (ref 8.6–10.4)
Chloride: 102 mmol/L (ref 98–110)
GLUCOSE: 124 mg/dL — AB (ref 65–99)
Potassium: 3.6 mmol/L (ref 3.5–5.3)
SODIUM: 139 mmol/L (ref 135–146)

## 2015-05-02 LAB — CBC
HEMATOCRIT: 40.2 % (ref 36.0–46.0)
Hemoglobin: 13.7 g/dL (ref 12.0–15.0)
MCH: 30.6 pg (ref 26.0–34.0)
MCHC: 34.1 g/dL (ref 30.0–36.0)
MCV: 89.9 fL (ref 78.0–100.0)
MPV: 8.7 fL (ref 8.6–12.4)
PLATELETS: 343 10*3/uL (ref 150–400)
RBC: 4.47 MIL/uL (ref 3.87–5.11)
RDW: 13.8 % (ref 11.5–15.5)
WBC: 7.1 10*3/uL (ref 4.0–10.5)

## 2015-05-02 LAB — HEPATIC FUNCTION PANEL
ALT: 37 U/L — ABNORMAL HIGH (ref 6–29)
AST: 22 U/L (ref 10–35)
Albumin: 3.9 g/dL (ref 3.6–5.1)
Alkaline Phosphatase: 73 U/L (ref 33–130)
BILIRUBIN TOTAL: 0.3 mg/dL (ref 0.2–1.2)
Total Protein: 6.6 g/dL (ref 6.1–8.1)

## 2015-05-02 LAB — TSH: TSH: 1.35 mIU/L

## 2015-05-02 NOTE — Patient Instructions (Addendum)
Medication Instructions:   Your physician recommends that you continue on your current medications as directed. Please refer to the Current Medication list given to you today.   If you need a refill on your cardiac medications before your next appointment, please call your pharmacy.  Labwork: BMET CBC TSH LFT    Testing/Procedures:    Your physician has requested that you have a lexiscan myoview. For further information please visit [email or web address]. Please follow instruction sheet, as given.  Your physician has recommended that you wear an event monitor. Event monitors are medical devices that record the heart's electrical activity. Doctors most often [place] these monitors to diagnose arrhythmias. Arrhythmias are problems with the speed or rhythm of the heartbeat. The monitor is a small, portable device. You can wear one while you do your normal daily activities. This is usually used to diagnose what is causing palpitations/syncope (passing out).   Follow-Up:     Any Other Special Instructions Will Be Listed Below (If Applicable).

## 2015-05-02 NOTE — Progress Notes (Signed)
Cardiology Office Note Date:  05/02/2015  Patient ID:  [name], DOB [id number], MRN [id number] PCP:  Gara Kroner, MD  Electrophysiologist: Dr. Rayann Heman     Chief Complaint: palpitations, CP  History of Present Illness: [name] is a 60 y.o. female with history of HLD, RA, recurrent DVT with reported dx hypercoaguable, PAFib.  Her PAF hx is : AFib ablation at [facility] 2010, Dr. Annabell Howells DCCV 2012 On Flecainide, Warfarin   She was last seen by Dr. Rayann Heman March 2016, at that time doing well with reported palpitations correlating to APCs by an [employer] monitor app.  She comes to the office today feeling more symptoms of last.  She was at the coumadin clinic yesterday and mentioned that she had been having symptoms, he was seen by and RN and at DOD recommendations an appt today was arranged.  She states particularly since Monday has felt like she is having more palpitations and some associated chest discomfort  As well.  Both of these symptoms are somewhat chronic, dating the same discomfort back years which she was cathed for in [place] without CAD per her report, she had a normal stress myoview in 2014 as well.  She states this is the same symptom, left chest with radiation to her left jaw/neck, no clear trigger, possibly associated these past days with more palpiations then ususal.  She did have a dose of steroids last week for preparation of a contrast study for allergy prophylaxis.  She did take an extra Verapamil last night and things did settle down for her.  She has taken a coupke recordings on her heart rhythm app with symptoms that she reports show APCs and VPCs but no AF.  She has had some degree of lightheadedness, nausea with these events, they can happen at rest or up and around, generally she will go lay down and try to relax and they resolved.  She has not had syncope.  She feels well at this time without active symptoms but reports an episode earlier this  morning, brief.   Past Medical History  Diagnosis Date  . Allergic rhinitis   . Hypercholesteremia   . GERD (gastroesophageal reflux disease)   . Asthma   . PAF (paroxysmal atrial fibrillation) ([place])     a. s/p afib ablation at [facility] 2010 (Dr. Annabell Howells). b. Recurrent AF s/p DCCV 06/2010. c. On flecainide.   . Rheumatoid arthritis(714.0)   . OSA (obstructive sleep apnea)     a. Mild, did not tolerate OSA.  . Obesity   . Atrial flutter ([place])     a. Remotely per notes.  . Wandering (atrial) pacemaker     a. Remotely per notes.  Marland Kitchen DVT (deep venous thrombosis) ([place])     a. 1980s x2 - between birth of two sons. Saw hematology - was told she has a hypercoagulable disorder, should be on Coumadin lifelong.  . Halothane adverse reaction     narrow small opening  . PONV (postoperative nausea and vomiting)   . Colon polyps   . Normal cardiac stress test 06/2012  . Hyperglycemia     a. A1c 6.1 in 2014.    Past Surgical History  Procedure Laterality Date  . Cesarean section    . Carpal tunnel release Bilateral   . Inguinal hernia repair Right   . Cholecystectomy    . Tmj arthroplasty    . Afib ablation  2010    at [facility]  Outpatient Prescriptions  Medication Sig Dispense Refill  . ASMANEX 60 METERED DOSES 220 MCG/INH inhaler Inhale 1 puff into the lungs 2 (two) times daily.     . cetirizine (ZYRTEC) 10 MG tablet Take 10 mg by mouth daily.      . Cholecalciferol (VITAMIN D3) 1000 UNITS CAPS Take 1 capsule by mouth daily.     Marland Kitchen enoxaparin (LOVENOX) 120 MG/0.8ML injection Inject 0.8 mLs (120 mg total) into the skin every 12 (twelve) hours. 10 Syringe 0  . flecainide (TAMBOCOR) 50 MG tablet TAKE 1 TABLET (50 MG TOTAL) BY MOUTH 2 (TWO) TIMES DAILY. 180 tablet 0  . fluticasone (FLONASE) 50 MCG/ACT nasal spray Place 1 spray into the nose 2 (two) times daily.     Marland Kitchen losartan (COZAAR) 25 MG tablet Take 1 tablet (25 mg total) by mouth daily. 90 tablet 0  . Multiple  Vitamin (MULTIVITAMIN) tablet Take 1 tablet by mouth daily.      Marland Kitchen omeprazole (PRILOSEC) 40 MG capsule Take 40 mg by mouth daily.     . ondansetron (ZOFRAN ODT) 4 MG disintegrating tablet Take 1 tablet (4 mg total) by mouth every 8 (eight) hours as needed for nausea or vomiting. 10 tablet 0  . PROAIR HFA 108 (90 BASE) MCG/ACT inhaler Inhale 2 puffs into the lungs every 6 (six) hours as needed for wheezing or shortness of breath.     Corrie Dandy (KERYDIN) 5 % SOLN Apply one drop to each affected nail once daily for 12 months 10 mL 4  . venlafaxine XR (EFFEXOR-XR) 37.5 MG 24 hr capsule Take 37.5 mg by mouth every morning.    . verapamil (CALAN) 80 MG tablet Take 1.5 tablets by mouth 2 (two) times daily.  1  . warfarin (COUMADIN) 2 MG tablet TAKE 1 TABLET BY MOUTH AS DIRECTED 210 tablet 1   Current Facility-Administered Medications  Medication Dose Route Frequency Provider Last Rate Last Dose  . triamcinolone acetonide (KENALOG) 10 MG/ML injection 10 mg  10 mg Other Once Harriet Masson, DPM      . triamcinolone acetonide (KENALOG) 10 MG/ML injection 10 mg  10 mg Other Once Harriet Masson, DPM        Allergies:   Aspirin; Ivp dye; Latex; Meperidine hcl; Penicillins; Statins; and Sulfonamide derivatives   Social History:  The patient  reports that she has never smoked. She has never used smokeless tobacco. She reports that she drinks alcohol. She reports that she does not use illicit drugs.   Family History:  The patient's family history includes Colon polyps in her father; Heart attack in her mother and paternal grandfather; Lupus in her sister; Stroke in her maternal grandfather, mother, and paternal grandmother; Thyroid disease in her mother and sister.  ROS:  Please see the history of present illness.  All other systems are reviewed and otherwise negative.   PHYSICAL EXAM:  VS:  BP 132/76 mmHg  Pulse 82  Ht 5\' 5"  (1.651 m)  Wt 240 lb 12.8 oz (109.226 kg)  BMI 40.07 kg/m2 BMI: Body mass  index is 40.07 kg/(m^2). A very pleasent, obese, WF appears in no acute distress HEENT: normocephalic, atraumatic Neck: no JVD, carotid bruits or masses Cardiac:  normal S1, S2; RRR; no significant murmurs, no rubs, or gallops Lungs:  clear to auscultation bilaterally, no wheezing, rhonchi or rales Abd: soft, nontender MS: no deformity or atrophy Ext: no edema Skin: warm and dry, no rash Neuro:  No gross deficits appreciated Psych: euthymic mood, full  affect   EKG:  Done today shows SR/ectopic atrial rhythm, no ischemic changes, appears unchanged from yesterday and historical EKGs  01/13/13: Echocardiogram Study Conclusions - Left ventricle: The cavity size was normal. Wall thickness was increased in a pattern of mild LVH. Systolic function was normal. The estimated ejection fraction was in the range of 60% to 65%. Wall motion was normal; there were no regional wall motion abnormalities. Doppler parameters are consistent with abnormal left ventricular relaxation (grade 1 diastolic dysfunction). - Left atrium: The atrium was mildly dilated. - Atrial septum: No defect or patent foramen ovale was identified. - Pericardium, extracardiac: A trivial pericardial effusion was identified posterior to the heart.  06/22/12: stress myoview Impression Exercise Capacity: Poor exercise capacity. BP Response: Normal blood pressure response. Clinical Symptoms: No significant symptoms noted. ECG Impression: No significant ST segment change suggestive of ischemia. Comparison with Prior Nuclear Study: No images to compare Overall Impression: Normal stress nuclear study. LV Ejection Fraction: 81%. LV Wall Motion: NL LV Function; NL Wall Motion   Recent Labs: 11/29/2014: ALT 58*; B Natriuretic Peptide 17.1; BUN 12; Creatinine, Ser 0.74; Hemoglobin 13.1; Platelets 285; Potassium 3.1*; Sodium 138  No results found for requested labs within last 365 days.   CrCl cannot be  calculated (Patient has no serum creatinine result on file.).   Wt Readings from Last 3 Encounters:  05/02/15 240 lb 12.8 oz (109.226 kg)  11/29/14 250 lb (113.399 kg)  05/28/14 251 lb 3.2 oz (113.944 kg)     Other studies reviewed: Additional studies/records reviewed today include: summarized above  ASSESSMENT AND PLAN:  Chronic hx of similar symptoms, palpitations and the same chest discomfort, increased post steroid therapy (100mg  PO) on Friday for contrast allergy prophylaxis.  Suspect this exacerbated her ectopy.  He recordings are reviewed, appear be artifact, but the patient is confident that these are real ectopy and correlate with her symptoms well.   1. PAFib, palpitations       On Flecainide with verapamil       CHADS2Vasc is at least 2 on warfarin, monitored and managed by the coumdin clinic  2. Chest discomfort     Reported as a chronic sounding c/o, increased this week     Negative myoview 2014, reportedly negative cath years ago for similar symptoms       Disposition: We will get labs, BMET to rechek her K+, CBC given her a/c, she had abnormal LFTs at an ER visit with GI illness, will f/u and refer to her PMD if remains abnormal, check TSH.  Discussed with Dr. Rayann Heman, DOD, We will schedule her for an echo doppler and lexiscan stress myoview.  Discussed with the patient we will schedule a 30 day monitor for her, but if her palpitations continue to settle down, may hold off.  She is instructed to seek attention if her symptoms escalate at all.  She has an appt with Dr. Rayann Heman in March, will keep this pending her test results and symptoms.  If she is feeling well and testing looks OK, can move this out.  Current medicines are reviewed at length with the patient today.  The patient did not have any concerns regarding medicines  Signed, Jennings Books, PA-C 05/02/2015 9:53 AM     [facility] [address] [address] [phone] (office)    [phone] (fax)

## 2015-05-03 ENCOUNTER — Ambulatory Visit (INDEPENDENT_AMBULATORY_CARE_PROVIDER_SITE_OTHER): Payer: 59 | Admitting: Pharmacist

## 2015-05-03 DIAGNOSIS — I480 Paroxysmal atrial fibrillation: Secondary | ICD-10-CM

## 2015-05-03 DIAGNOSIS — I4891 Unspecified atrial fibrillation: Secondary | ICD-10-CM

## 2015-05-03 DIAGNOSIS — Z5181 Encounter for therapeutic drug level monitoring: Secondary | ICD-10-CM | POA: Diagnosis not present

## 2015-05-03 LAB — POCT INR: INR: 2.4

## 2015-05-06 ENCOUNTER — Telehealth: Payer: Self-pay | Admitting: *Deleted

## 2015-05-06 NOTE — Telephone Encounter (Signed)
-----   Message from [place], [place] sent at 05/03/2015  7:56 AM EST ----- Please let the patient know her labs look OK, one of her liver enzyme result is minimally elevated but improved from her previous test, will forward to her PMD.  Pleas send results to her PMD as well.  Thanks [employer]

## 2015-05-07 ENCOUNTER — Telehealth: Payer: Self-pay | Admitting: Internal Medicine

## 2015-05-07 NOTE — Telephone Encounter (Signed)
Follow Up   Pt returned the call for results  If you call back today please call [phone] any other time please call the mobile number on file.

## 2015-05-07 NOTE — Telephone Encounter (Signed)
Informed of her blood work results

## 2015-05-08 ENCOUNTER — Telehealth (HOSPITAL_COMMUNITY): Payer: Self-pay | Admitting: *Deleted

## 2015-05-08 NOTE — Telephone Encounter (Signed)
Patient given detailed instructions per Myocardial Perfusion Study Information Sheet for the test on 05/13/15 at 1230. Patient notified to arrive 15 minutes early and that it is imperative to arrive on time for appointment to keep from having the test rescheduled.  If you need to cancel or reschedule your appointment, please call the office within 24 hours of your appointment. Failure to do so may result in a cancellation of your appointment, and a $50 no show fee. Patient verbalized understanding.Jaryiah Mehlman, Ranae [place]

## 2015-05-08 NOTE — Telephone Encounter (Signed)
Patient given detailed instructions per Myocardial Perfusion Study Information Sheet for the test on 05/13/15 at 1230. Patient notified to arrive 15 minutes early and that it is imperative to arrive on time for appointment to keep from having the test rescheduled.  If you need to cancel or reschedule your appointment, please call the office within 24 hours of your appointment. Failure to do so may result in a cancellation of your appointment, and a $50 no show fee. Patient verbalized understanding.Roniya Tetro, Ranae Palms

## 2015-05-09 ENCOUNTER — Telehealth: Payer: Self-pay | Admitting: *Deleted

## 2015-05-09 NOTE — Telephone Encounter (Signed)
-----   Message from [place], [place] sent at 05/03/2015  7:56 AM EST ----- Please let the patient know her labs look OK, one of her liver enzyme result is minimally elevated but improved from her previous test, will forward to her PMD.  Pleas send results to her PMD as well.  Thanks [employer]

## 2015-05-13 ENCOUNTER — Ambulatory Visit (HOSPITAL_COMMUNITY): Payer: 59 | Attending: Cardiovascular Disease

## 2015-05-13 DIAGNOSIS — R079 Chest pain, unspecified: Secondary | ICD-10-CM | POA: Diagnosis not present

## 2015-05-13 DIAGNOSIS — R9439 Abnormal result of other cardiovascular function study: Secondary | ICD-10-CM | POA: Insufficient documentation

## 2015-05-13 DIAGNOSIS — R002 Palpitations: Secondary | ICD-10-CM | POA: Insufficient documentation

## 2015-05-13 DIAGNOSIS — I10 Essential (primary) hypertension: Secondary | ICD-10-CM | POA: Diagnosis not present

## 2015-05-13 MED ORDER — REGADENOSON 0.4 MG/5ML IV SOLN
0.4000 mg | Freq: Once | INTRAVENOUS | Status: AC
  Administered 2015-05-13: 0.4 mg via INTRAVENOUS

## 2015-05-13 MED ORDER — TECHNETIUM TC 99M SESTAMIBI GENERIC - CARDIOLITE
33.0000 | Freq: Once | INTRAVENOUS | Status: AC | PRN
  Administered 2015-05-13: 33 via INTRAVENOUS

## 2015-05-14 ENCOUNTER — Ambulatory Visit (HOSPITAL_COMMUNITY): Payer: 59 | Attending: Cardiology

## 2015-05-14 DIAGNOSIS — M25551 Pain in right hip: Secondary | ICD-10-CM | POA: Diagnosis not present

## 2015-05-14 LAB — MYOCARDIAL PERFUSION IMAGING
CHL CUP NUCLEAR SDS: 3
CHL CUP NUCLEAR SRS: 6
CHL CUP RESTING HR STRESS: 62 {beats}/min
LV dias vol: 77 mL
LV sys vol: 26 mL
Peak HR: 94 {beats}/min
RATE: 0.26
SSS: 9
TID: 0.88

## 2015-05-14 MED ORDER — TECHNETIUM TC 99M SESTAMIBI GENERIC - CARDIOLITE
30.7000 | Freq: Once | INTRAVENOUS | Status: AC | PRN
  Administered 2015-05-14: 30.7 via INTRAVENOUS

## 2015-05-16 ENCOUNTER — Telehealth: Payer: Self-pay | Admitting: *Deleted

## 2015-05-16 NOTE — Telephone Encounter (Signed)
-----   Message from [facility], [place] sent at 05/15/2015  3:18 PM EST ----- Please let the patient know her stress test result looks good, no findings to suggest heart blockage.   Is she feeling better?  Thanks [employer]

## 2015-05-16 NOTE — Telephone Encounter (Signed)
-----   Message from [facility], [place] sent at 05/15/2015  3:18 PM EST ----- Please let the patient know her stress test result looks good, no findings to suggest heart blockage.   Is she feeling better?  Thanks [employer]

## 2015-05-22 DIAGNOSIS — R05 Cough: Secondary | ICD-10-CM | POA: Diagnosis not present

## 2015-05-24 ENCOUNTER — Ambulatory Visit (INDEPENDENT_AMBULATORY_CARE_PROVIDER_SITE_OTHER): Payer: 59 | Admitting: *Deleted

## 2015-05-24 DIAGNOSIS — I480 Paroxysmal atrial fibrillation: Secondary | ICD-10-CM

## 2015-05-24 DIAGNOSIS — Z5181 Encounter for therapeutic drug level monitoring: Secondary | ICD-10-CM | POA: Diagnosis not present

## 2015-05-24 DIAGNOSIS — I4891 Unspecified atrial fibrillation: Secondary | ICD-10-CM | POA: Diagnosis not present

## 2015-05-24 LAB — POCT INR: INR: 3

## 2015-06-06 ENCOUNTER — Ambulatory Visit: Payer: 59 | Admitting: Internal Medicine

## 2015-06-07 DIAGNOSIS — M1611 Unilateral primary osteoarthritis, right hip: Secondary | ICD-10-CM | POA: Diagnosis not present

## 2015-06-07 DIAGNOSIS — M25551 Pain in right hip: Secondary | ICD-10-CM | POA: Diagnosis not present

## 2015-06-09 ENCOUNTER — Other Ambulatory Visit: Payer: Self-pay | Admitting: Internal Medicine

## 2015-06-12 ENCOUNTER — Ambulatory Visit: Payer: 59 | Admitting: Internal Medicine

## 2015-06-18 ENCOUNTER — Other Ambulatory Visit: Payer: Self-pay | Admitting: Internal Medicine

## 2015-06-21 ENCOUNTER — Ambulatory Visit (INDEPENDENT_AMBULATORY_CARE_PROVIDER_SITE_OTHER): Payer: 59 | Admitting: *Deleted

## 2015-06-21 DIAGNOSIS — Z5181 Encounter for therapeutic drug level monitoring: Secondary | ICD-10-CM

## 2015-06-21 DIAGNOSIS — I4891 Unspecified atrial fibrillation: Secondary | ICD-10-CM

## 2015-06-21 DIAGNOSIS — I480 Paroxysmal atrial fibrillation: Secondary | ICD-10-CM

## 2015-06-21 LAB — POCT INR: INR: 1.6

## 2015-07-04 ENCOUNTER — Ambulatory Visit (INDEPENDENT_AMBULATORY_CARE_PROVIDER_SITE_OTHER): Payer: 59 | Admitting: Pharmacist

## 2015-07-04 DIAGNOSIS — I480 Paroxysmal atrial fibrillation: Secondary | ICD-10-CM | POA: Diagnosis not present

## 2015-07-04 DIAGNOSIS — Z5181 Encounter for therapeutic drug level monitoring: Secondary | ICD-10-CM | POA: Diagnosis not present

## 2015-07-04 DIAGNOSIS — I4891 Unspecified atrial fibrillation: Secondary | ICD-10-CM

## 2015-07-04 LAB — POCT INR: INR: 2.4

## 2015-07-08 DIAGNOSIS — R0602 Shortness of breath: Secondary | ICD-10-CM | POA: Diagnosis not present

## 2015-08-02 ENCOUNTER — Ambulatory Visit (INDEPENDENT_AMBULATORY_CARE_PROVIDER_SITE_OTHER): Payer: 59

## 2015-08-02 DIAGNOSIS — I480 Paroxysmal atrial fibrillation: Secondary | ICD-10-CM

## 2015-08-02 DIAGNOSIS — I4891 Unspecified atrial fibrillation: Secondary | ICD-10-CM | POA: Diagnosis not present

## 2015-08-02 DIAGNOSIS — Z5181 Encounter for therapeutic drug level monitoring: Secondary | ICD-10-CM | POA: Diagnosis not present

## 2015-08-02 LAB — POCT INR: INR: 3

## 2015-08-07 ENCOUNTER — Telehealth: Payer: Self-pay | Admitting: Internal Medicine

## 2015-08-07 NOTE — Telephone Encounter (Signed)
New message      Pt is on coumadin.  Can she take dolobid 500mg ---2 tablets stat, then 1 tablet every 12hrs until gone?

## 2015-08-07 NOTE — Telephone Encounter (Signed)
Returned patient's call. Short tx course, she was given 12 tabs. Advised pt that NSAIDs can increase risk for GI bleed and advised her to take tabs with food. She will watch for any extra bruising or bleeding.

## 2015-08-15 ENCOUNTER — Encounter: Payer: Self-pay | Admitting: Internal Medicine

## 2015-08-21 ENCOUNTER — Encounter: Payer: Self-pay | Admitting: Internal Medicine

## 2015-08-21 ENCOUNTER — Ambulatory Visit (INDEPENDENT_AMBULATORY_CARE_PROVIDER_SITE_OTHER): Payer: 59 | Admitting: Internal Medicine

## 2015-08-21 VITALS — BP 124/80 | HR 78 | Ht 65.0 in | Wt 248.0 lb

## 2015-08-21 DIAGNOSIS — E669 Obesity, unspecified: Secondary | ICD-10-CM

## 2015-08-21 DIAGNOSIS — I480 Paroxysmal atrial fibrillation: Secondary | ICD-10-CM

## 2015-08-21 DIAGNOSIS — I10 Essential (primary) hypertension: Secondary | ICD-10-CM | POA: Diagnosis not present

## 2015-08-21 NOTE — Progress Notes (Signed)
Electrophysiology Office Note   Date:  08/21/2015   ID:  [name], DOB [id number], MRN [id number]  PCP:  Gara Kroner, MD   Primary Electrophysiologist: Thompson Grayer, MD    Chief Complaint  Patient presents with  . Atrial Fibrillation     History of Present Illness: [name] is a 60 y.o. female who presents today for electrophysiology evaluation.  No further SOB or CP since 2/17.  Myoview low risk.  Palpitations are well controlled.  Today, she denies symptoms of orthopnea, PND, lower extremity edema, claudication, dizziness, presyncope, syncope, bleeding, or neurologic sequela. The patient is tolerating medications without difficulties and is otherwise without complaint today.    Past Medical History  Diagnosis Date  . Allergic rhinitis   . Hypercholesteremia   . GERD (gastroesophageal reflux disease)   . Asthma   . PAF (paroxysmal atrial fibrillation) ([place])     a. s/p afib ablation at [facility] 2010 (Dr. Annabell Howells). b. Recurrent AF s/p DCCV 06/2010. c. On flecainide.   . Rheumatoid arthritis(714.0)   . OSA (obstructive sleep apnea)     a. Mild, did not tolerate OSA.  . Obesity   . Atrial flutter ([place])     a. Remotely per notes.  . Wandering (atrial) pacemaker     a. Remotely per notes.  Marland Kitchen DVT (deep venous thrombosis) ([place])     a. 1980s x2 - between birth of two sons. Saw hematology - was told she has a hypercoagulable disorder, should be on Coumadin lifelong.  . Halothane adverse reaction     narrow small opening  . PONV (postoperative nausea and vomiting)   . Colon polyps   . Normal cardiac stress test 06/2012  . Hyperglycemia     a. A1c 6.1 in 2014.   Past Surgical History  Procedure Laterality Date  . Cesarean section    . Carpal tunnel release Bilateral   . Inguinal hernia repair Right   . Cholecystectomy    . Tmj arthroplasty    . Afib ablation  2010    at [place] Prescriptions  Medication Sig  Dispense Refill  . cetirizine (ZYRTEC) 10 MG tablet Take 10 mg by mouth daily.      . Cholecalciferol (VITAMIN D3) 1000 UNITS CAPS Take 1 capsule by mouth daily.     . flecainide (TAMBOCOR) 50 MG tablet TAKE 1 TABLET (50 MG TOTAL) BY MOUTH 2 (TWO) TIMES DAILY. 180 tablet 1  . fluticasone (FLONASE) 50 MCG/ACT nasal spray Place 1 spray into the nose 2 (two) times daily.     . fluticasone (FLOVENT HFA) 110 MCG/ACT inhaler Inhale 1 puff into the lungs 2 (two) times daily.    Marland Kitchen losartan (COZAAR) 25 MG tablet Take 1 tablet (25 mg total) by mouth daily. 90 tablet 0  . Multiple Vitamin (MULTIVITAMIN) tablet Take 1 tablet by mouth daily.      Marland Kitchen omeprazole (PRILOSEC) 40 MG capsule Take 40 mg by mouth daily.     . ondansetron (ZOFRAN ODT) 4 MG disintegrating tablet Take 1 tablet (4 mg total) by mouth every 8 (eight) hours as needed for nausea or vomiting. 10 tablet 0  . PROAIR HFA 108 (90 BASE) MCG/ACT inhaler Inhale 2 puffs into the lungs every 6 (six) hours as needed for wheezing or shortness of breath.     . venlafaxine XR (EFFEXOR-XR) 37.5 MG 24 hr capsule Take 37.5 mg by mouth every morning.    Marland Kitchen  verapamil (CALAN) 80 MG tablet Take 1.5 tablets by mouth 2 (two) times daily.  1  . warfarin (COUMADIN) 2 MG tablet Take 1 tablet (2 mg total) by mouth as directed. Pt take 2 tablets daily and 3 tablets 3 days a week or As Directed 210 tablet 1   Current Facility-Administered Medications  Medication Dose Route Frequency Provider Last Rate Last Dose  . triamcinolone acetonide (KENALOG) 10 MG/ML injection 10 mg  10 mg Other Once Harriet Masson, DPM      . triamcinolone acetonide (KENALOG) 10 MG/ML injection 10 mg  10 mg Other Once Harriet Masson, DPM        Allergies:   Aspirin; Ivp dye; Latex; Meperidine hcl; Penicillins; Statins; and Sulfonamide derivatives   Social History:  The patient  reports that she has never smoked. She has never used smokeless tobacco. She reports that she drinks alcohol. She  reports that she does not use illicit drugs.   Family History:  The patient's  family history includes Colon polyps in her father; Heart attack in her mother and paternal grandfather; Lupus in her sister; Stroke in her maternal grandfather, mother, and paternal grandmother; Thyroid disease in her mother and sister.    ROS:  Please see the history of present illness.   All other systems are reviewed and negative.    PHYSICAL EXAM: VS:  BP 124/80 mmHg  Pulse 78  Ht 5\' 5"  (1.651 m)  Wt 248 lb (112.492 kg)  BMI 41.27 kg/m2 , BMI Body mass index is 41.27 kg/(m^2). GEN: Well nourished, well developed, in no acute distress HEENT: normal Neck: no JVD, carotid bruits, or masses Cardiac: RRR; no murmurs, rubs, or gallops,no edema  Respiratory:  clear to auscultation bilaterally, normal work of breathing GI: soft, nontender, nondistended, + BS MS: no deformity or atrophy Skin: warm and dry  Neuro:  Strength and sensation are intact Psych: euthymic mood, full affect  EKG:  EKG is ordered today. The ekg ordered today shows sinus rhythm 78 bpm, PR 146 msec, QRS 106 msec, Qtc 465 msec, incomplete rbbb   Recent Labs: 11/29/2014: B Natriuretic Peptide 17.1 05/02/2015: ALT 37*; BUN 14; Creat 0.71; Hemoglobin 13.7; Platelets 343; Potassium 3.6; Sodium 139; TSH 1.35    Lipid Panel     Component Value Date/Time   CHOL 238* 01/13/2013 0139   TRIG 247* 01/13/2013 0139   HDL 48 01/13/2013 0139   CHOLHDL 5.0 01/13/2013 0139   VLDL 49* 01/13/2013 0139   LDLCALC 141* 01/13/2013 0139     Wt Readings from Last 3 Encounters:  08/21/15 248 lb (112.492 kg)  05/13/15 240 lb (108.863 kg)  05/02/15 240 lb 12.8 oz (109.226 kg)    ASSESSMENT AND PLAN:  1. afib Well controlled No changes Lifestyle modification encouraged Repeat echo  2. HTN Stable No change required today 2 gram sodium diet  3. obestity I had a long discussion today about the importance of weight loss and exercsie Body mass  index is 41.27 kg/(m^2).   4. Sleep apnea Mild She has previously seen Dr Maxwell Caul who recommends conservative measures  Follow-up with Roderic Palau NP in 6 months Return to see me in 12 months   Current medicines are reviewed at length with the patient today.   The patient does not have concerns regarding her medicines.  The following changes were made today:  none  Labs/ tests ordered today include:  No orders of the defined types were placed in this encounter.  Army Fossa, MD  08/21/2015 11:02 AM     [facility] [address] [place]  [address] [phone] (office) [phone] (fax)

## 2015-08-21 NOTE — Patient Instructions (Signed)
Medication Instructions:  Your physician recommends that you continue on your current medications as directed. Please refer to the Current Medication list given to you today.  Labwork: None ordered  Testing/Procedures: Your physician has requested that you have an echocardiogram. Echocardiography is a painless test that uses sound waves to create images of your heart. It provides your doctor with information about the size and shape of your heart and how well your heart's chambers and valves are working. This procedure takes approximately one hour. There are no restrictions for this procedure.  Follow-Up: Your physician recommends that you schedule a follow-up appointment in: 6 months with Roderic Palau, NP.  Your physician wants you to follow-up in: 1 year with Dr. Rayann Heman.  You will receive a reminder letter in the mail two months in advance. If you don't receive a letter, please call our office to schedule the follow-up appointment.  If you need a refill on your cardiac medications before your next appointment, please call your pharmacy.  Thank you for choosing [facility]!!     Any Other Special Instructions Will Be Listed Below (If Applicable). Low-Sodium Eating Plan Sodium raises blood pressure and causes water to be held in the body. Getting less sodium from food will help lower your blood pressure, reduce any swelling, and protect your heart, liver, and kidneys. We get sodium by adding salt (sodium chloride) to food. Most of our sodium comes from canned, boxed, and frozen foods. Restaurant foods, fast foods, and pizza are also very high in sodium. Even if you take medicine to lower your blood pressure or to reduce fluid in your body, getting less sodium from your food is important. WHAT IS MY PLAN? Most people should limit their sodium intake to 2,300 mg a day. Your health care provider recommends that you limit your sodium intake to ___2000 mg__ a day.  WHAT DO I NEED TO KNOW  ABOUT THIS EATING PLAN? For the low-sodium eating plan, you will follow these general guidelines:  Choose foods with a % Daily Value for sodium of less than 5% (as listed on the food label).   Use salt-free seasonings or herbs instead of table salt or sea salt.   Check with your health care provider or pharmacist before using salt substitutes.   Eat fresh foods.  Eat more vegetables and fruits.  Limit canned vegetables. If you do use them, rinse them well to decrease the sodium.   Limit cheese to 1 oz (28 g) per day.   Eat lower-sodium products, often labeled as "lower sodium" or "no salt added."  Avoid foods that contain monosodium glutamate (MSG). MSG is sometimes added to [place] food and some canned foods.  Check food labels (Nutrition Facts labels) on foods to learn how much sodium is in one serving.  Eat more home-cooked food and less restaurant, buffet, and fast food.  When eating at a restaurant, ask that your food be prepared with less salt, or no salt if possible.  HOW DO I READ FOOD LABELS FOR SODIUM INFORMATION? The Nutrition Facts label lists the amount of sodium in one serving of the food. If you eat more than one serving, you must multiply the listed amount of sodium by the number of servings. Food labels may also identify foods as:  Sodium free--Less than 5 mg in a serving.  Very low sodium--35 mg or less in a serving.  Low sodium--140 mg or less in a serving.  Light in sodium--50% less sodium in a serving.  For example, if a food that usually has 300 mg of sodium is changed to become light in sodium, it will have 150 mg of sodium.  Reduced sodium--25% less sodium in a serving. For example, if a food that usually has 400 mg of sodium is changed to reduced sodium, it will have 300 mg of sodium. WHAT FOODS CAN I EAT? Grains Low-sodium cereals, including oats, puffed wheat and rice, and shredded wheat cereals. Low-sodium crackers. Unsalted rice and  pasta. Lower-sodium bread.  Vegetables Frozen or fresh vegetables. Low-sodium or reduced-sodium canned vegetables. Low-sodium or reduced-sodium tomato sauce and paste. Low-sodium or reduced-sodium tomato and vegetable juices.  Fruits Fresh, frozen, and canned fruit. Fruit juice.  Meat and Other Protein Products Low-sodium canned tuna and salmon. Fresh or frozen meat, poultry, seafood, and fish. Lamb. Unsalted nuts. Dried beans, peas, and lentils without added salt. Unsalted canned beans. Homemade soups without salt. Eggs.  Dairy Milk. Soy milk. Ricotta cheese. Low-sodium or reduced-sodium cheeses. Yogurt.  Condiments Fresh and dried herbs and spices. Salt-free seasonings. Onion and garlic powders. Low-sodium varieties of mustard and ketchup. Fresh or refrigerated horseradish. Lemon juice.  Fats and Oils Reduced-sodium salad dressings. Unsalted butter.  Other Unsalted popcorn and pretzels.  The items listed above may not be a complete list of recommended foods or beverages. Contact your dietitian for more options. WHAT FOODS ARE NOT RECOMMENDED? Grains Instant hot cereals. Bread stuffing, pancake, and biscuit mixes. Croutons. Seasoned rice or pasta mixes. Noodle soup cups. Boxed or frozen macaroni and cheese. Self-rising flour. Regular salted crackers. Vegetables Regular canned vegetables. Regular canned tomato sauce and paste. Regular tomato and vegetable juices. Frozen vegetables in sauces. Salted [place] fries. Olives. Angie Fava. Relishes. Sauerkraut. Salsa. Meat and Other Protein Products Salted, canned, smoked, spiced, or pickled meats, seafood, or fish. Bacon, ham, sausage, hot dogs, corned beef, chipped beef, and packaged luncheon meats. Salt pork. Jerky. Pickled herring. Anchovies, regular canned tuna, and sardines. Salted nuts. Dairy Processed cheese and cheese spreads. Cheese curds. Blue cheese and cottage cheese. Buttermilk.  Condiments Onion and garlic salt, seasoned  salt, table salt, and sea salt. Canned and packaged gravies. Worcestershire sauce. Tartar sauce. Barbecue sauce. Teriyaki sauce. Soy sauce, including reduced sodium. Steak sauce. Fish sauce. Oyster sauce. Cocktail sauce. Horseradish that you find on the shelf. Regular ketchup and mustard. Meat flavorings and tenderizers. Bouillon cubes. Hot sauce. Tabasco sauce. Marinades. Taco seasonings. Relishes. Fats and Oils Regular salad dressings. Salted butter. Margarine. Ghee. Bacon fat.  Other Potato and tortilla chips. Corn chips and puffs. Salted popcorn and pretzels. Canned or dried soups. Pizza. Frozen entrees and pot pies.  The items listed above may not be a complete list of foods and beverages to avoid. Contact your dietitian for more information.   This information is not intended to replace advice given to you by your health care provider. Make sure you discuss any questions you have with your health care provider.   Document Released: 08/29/2001 Document Revised: 03/30/2014 Document Reviewed: 01/11/2013 Elsevier Interactive Patient Education [employer].

## 2015-08-30 ENCOUNTER — Ambulatory Visit (INDEPENDENT_AMBULATORY_CARE_PROVIDER_SITE_OTHER): Payer: 59 | Admitting: *Deleted

## 2015-08-30 DIAGNOSIS — Z5181 Encounter for therapeutic drug level monitoring: Secondary | ICD-10-CM | POA: Diagnosis not present

## 2015-08-30 DIAGNOSIS — I480 Paroxysmal atrial fibrillation: Secondary | ICD-10-CM | POA: Diagnosis not present

## 2015-08-30 DIAGNOSIS — I4891 Unspecified atrial fibrillation: Secondary | ICD-10-CM

## 2015-08-30 LAB — POCT INR: INR: 1.5

## 2015-09-02 ENCOUNTER — Ambulatory Visit (HOSPITAL_COMMUNITY)
Admission: RE | Admit: 2015-09-02 | Discharge: 2015-09-02 | Disposition: A | Discharge status: Home / Self Care | Payer: 59 | Source: Ambulatory Visit | Attending: Internal Medicine | Admitting: Internal Medicine

## 2015-09-02 DIAGNOSIS — I059 Rheumatic mitral valve disease, unspecified: Secondary | ICD-10-CM | POA: Diagnosis not present

## 2015-09-02 DIAGNOSIS — I515 Myocardial degeneration: Secondary | ICD-10-CM | POA: Insufficient documentation

## 2015-09-02 DIAGNOSIS — I119 Hypertensive heart disease without heart failure: Secondary | ICD-10-CM | POA: Insufficient documentation

## 2015-09-02 DIAGNOSIS — I480 Paroxysmal atrial fibrillation: Secondary | ICD-10-CM | POA: Insufficient documentation

## 2015-09-02 DIAGNOSIS — E785 Hyperlipidemia, unspecified: Secondary | ICD-10-CM | POA: Insufficient documentation

## 2015-09-02 DIAGNOSIS — I071 Rheumatic tricuspid insufficiency: Secondary | ICD-10-CM | POA: Diagnosis not present

## 2015-09-02 DIAGNOSIS — I4891 Unspecified atrial fibrillation: Secondary | ICD-10-CM | POA: Diagnosis present

## 2015-09-02 LAB — ECHOCARDIOGRAM COMPLETE
CHL CUP MV DEC (S): 309
CHL CUP STROKE VOLUME: 35 mL
E/e' ratio: 16.57
EWDT: 309 ms
FS: 38 % (ref 28–44)
IVS/LV PW RATIO, ED: 0.98
LA ID, A-P, ES: 35 mm
LA vol: 39.4 mL
LADIAMINDEX: 1.61 cm/m2
LAVOLA4C: 56.5 mL
LAVOLIN: 18.2 mL/m2
LEFT ATRIUM END SYS DIAM: 35 mm
LV E/e'average: 16.57
LV PW d: 11.4 mm — AB (ref 0.6–1.1)
LV SIMPSON'S DISK: 82
LV TDI E'MEDIAL: 4.53
LV dias vol: 43 mL — AB (ref 46–106)
LV e' LATERAL: 6.94 cm/s
LV sys vol index: 4 mL/m2
LV sys vol: 8 mL — AB (ref 14–42)
LVDIAVOLIN: 20 mL/m2
LVEEMED: 16.57
LVOT area: 2.54 cm2
LVOT diameter: 18 mm
MV Peak grad: 5 mmHg
MV pk A vel: 98.5 m/s
MVPKEVEL: 115 m/s
TAPSE: 21.7 mm
TDI e' lateral: 6.94

## 2015-09-13 ENCOUNTER — Ambulatory Visit (INDEPENDENT_AMBULATORY_CARE_PROVIDER_SITE_OTHER): Payer: 59 | Admitting: *Deleted

## 2015-09-13 DIAGNOSIS — I4891 Unspecified atrial fibrillation: Secondary | ICD-10-CM | POA: Diagnosis not present

## 2015-09-13 DIAGNOSIS — I480 Paroxysmal atrial fibrillation: Secondary | ICD-10-CM

## 2015-09-13 DIAGNOSIS — Z5181 Encounter for therapeutic drug level monitoring: Secondary | ICD-10-CM

## 2015-09-13 LAB — POCT INR: INR: 2

## 2015-09-17 ENCOUNTER — Other Ambulatory Visit: Payer: Self-pay | Admitting: Internal Medicine

## 2015-10-03 DIAGNOSIS — K219 Gastro-esophageal reflux disease without esophagitis: Secondary | ICD-10-CM | POA: Diagnosis not present

## 2015-10-03 DIAGNOSIS — I10 Essential (primary) hypertension: Secondary | ICD-10-CM | POA: Diagnosis not present

## 2015-10-03 DIAGNOSIS — I4891 Unspecified atrial fibrillation: Secondary | ICD-10-CM | POA: Diagnosis not present

## 2015-10-03 DIAGNOSIS — J45909 Unspecified asthma, uncomplicated: Secondary | ICD-10-CM | POA: Diagnosis not present

## 2015-10-03 DIAGNOSIS — L237 Allergic contact dermatitis due to plants, except food: Secondary | ICD-10-CM | POA: Diagnosis not present

## 2015-10-03 DIAGNOSIS — E119 Type 2 diabetes mellitus without complications: Secondary | ICD-10-CM | POA: Diagnosis not present

## 2015-10-03 DIAGNOSIS — N951 Menopausal and female climacteric states: Secondary | ICD-10-CM | POA: Diagnosis not present

## 2015-10-03 DIAGNOSIS — J309 Allergic rhinitis, unspecified: Secondary | ICD-10-CM | POA: Diagnosis not present

## 2015-10-03 DIAGNOSIS — E782 Mixed hyperlipidemia: Secondary | ICD-10-CM | POA: Diagnosis not present

## 2015-10-04 DIAGNOSIS — M79604 Pain in right leg: Secondary | ICD-10-CM | POA: Diagnosis not present

## 2015-10-04 DIAGNOSIS — M47896 Other spondylosis, lumbar region: Secondary | ICD-10-CM | POA: Diagnosis not present

## 2015-10-15 ENCOUNTER — Ambulatory Visit (INDEPENDENT_AMBULATORY_CARE_PROVIDER_SITE_OTHER): Payer: 59 | Admitting: Pharmacist

## 2015-10-15 DIAGNOSIS — I4891 Unspecified atrial fibrillation: Secondary | ICD-10-CM | POA: Diagnosis not present

## 2015-10-15 DIAGNOSIS — Z5181 Encounter for therapeutic drug level monitoring: Secondary | ICD-10-CM

## 2015-10-15 DIAGNOSIS — I480 Paroxysmal atrial fibrillation: Secondary | ICD-10-CM | POA: Diagnosis not present

## 2015-10-15 LAB — POCT INR: INR: 1.8

## 2015-11-06 ENCOUNTER — Ambulatory Visit (INDEPENDENT_AMBULATORY_CARE_PROVIDER_SITE_OTHER): Payer: 59 | Admitting: Pharmacist

## 2015-11-06 DIAGNOSIS — Z5181 Encounter for therapeutic drug level monitoring: Secondary | ICD-10-CM | POA: Diagnosis not present

## 2015-11-06 DIAGNOSIS — I4891 Unspecified atrial fibrillation: Secondary | ICD-10-CM | POA: Diagnosis not present

## 2015-11-06 DIAGNOSIS — I480 Paroxysmal atrial fibrillation: Secondary | ICD-10-CM

## 2015-11-06 LAB — POCT INR: INR: 2.5

## 2015-12-04 ENCOUNTER — Other Ambulatory Visit: Payer: Self-pay | Admitting: Internal Medicine

## 2015-12-05 ENCOUNTER — Ambulatory Visit (INDEPENDENT_AMBULATORY_CARE_PROVIDER_SITE_OTHER): Payer: 59 | Admitting: *Deleted

## 2015-12-05 DIAGNOSIS — I480 Paroxysmal atrial fibrillation: Secondary | ICD-10-CM | POA: Diagnosis not present

## 2015-12-05 DIAGNOSIS — Z5181 Encounter for therapeutic drug level monitoring: Secondary | ICD-10-CM | POA: Diagnosis not present

## 2015-12-05 DIAGNOSIS — I4891 Unspecified atrial fibrillation: Secondary | ICD-10-CM

## 2015-12-05 LAB — POCT INR: INR: 2.7

## 2015-12-10 DIAGNOSIS — M1611 Unilateral primary osteoarthritis, right hip: Secondary | ICD-10-CM | POA: Diagnosis not present

## 2015-12-10 DIAGNOSIS — M545 Low back pain: Secondary | ICD-10-CM | POA: Diagnosis not present

## 2016-01-01 ENCOUNTER — Other Ambulatory Visit: Payer: Self-pay | Admitting: *Deleted

## 2016-01-02 ENCOUNTER — Ambulatory Visit (INDEPENDENT_AMBULATORY_CARE_PROVIDER_SITE_OTHER): Payer: 59

## 2016-01-02 DIAGNOSIS — Z5181 Encounter for therapeutic drug level monitoring: Secondary | ICD-10-CM

## 2016-01-02 DIAGNOSIS — I4891 Unspecified atrial fibrillation: Secondary | ICD-10-CM

## 2016-01-02 DIAGNOSIS — Z01419 Encounter for gynecological examination (general) (routine) without abnormal findings: Secondary | ICD-10-CM | POA: Diagnosis not present

## 2016-01-02 DIAGNOSIS — I480 Paroxysmal atrial fibrillation: Secondary | ICD-10-CM | POA: Diagnosis not present

## 2016-01-02 DIAGNOSIS — Z6841 Body Mass Index (BMI) 40.0 and over, adult: Secondary | ICD-10-CM | POA: Diagnosis not present

## 2016-01-02 DIAGNOSIS — Z1231 Encounter for screening mammogram for malignant neoplasm of breast: Secondary | ICD-10-CM | POA: Diagnosis not present

## 2016-01-02 LAB — POCT INR: INR: 2.7

## 2016-01-09 DIAGNOSIS — H5213 Myopia, bilateral: Secondary | ICD-10-CM | POA: Diagnosis not present

## 2016-01-09 DIAGNOSIS — H2513 Age-related nuclear cataract, bilateral: Secondary | ICD-10-CM | POA: Diagnosis not present

## 2016-01-09 DIAGNOSIS — Z135 Encounter for screening for eye and ear disorders: Secondary | ICD-10-CM | POA: Diagnosis not present

## 2016-01-22 ENCOUNTER — Encounter (HOSPITAL_COMMUNITY): Payer: Self-pay | Admitting: Nurse Practitioner

## 2016-01-22 ENCOUNTER — Ambulatory Visit (HOSPITAL_COMMUNITY)
Admission: RE | Admit: 2016-01-22 | Discharge: 2016-01-22 | Disposition: A | Discharge status: Home / Self Care | Payer: 59 | Source: Ambulatory Visit | Attending: Nurse Practitioner | Admitting: Nurse Practitioner

## 2016-01-22 VITALS — BP 144/78 | HR 70 | Ht 65.0 in | Wt 243.2 lb

## 2016-01-22 DIAGNOSIS — Z7901 Long term (current) use of anticoagulants: Secondary | ICD-10-CM | POA: Insufficient documentation

## 2016-01-22 DIAGNOSIS — I4510 Unspecified right bundle-branch block: Secondary | ICD-10-CM | POA: Diagnosis not present

## 2016-01-22 DIAGNOSIS — Z886 Allergy status to analgesic agent status: Secondary | ICD-10-CM | POA: Insufficient documentation

## 2016-01-22 DIAGNOSIS — Z888 Allergy status to other drugs, medicaments and biological substances status: Secondary | ICD-10-CM | POA: Insufficient documentation

## 2016-01-22 DIAGNOSIS — Z880 Allergy status to penicillin: Secondary | ICD-10-CM | POA: Diagnosis not present

## 2016-01-22 DIAGNOSIS — I480 Paroxysmal atrial fibrillation: Secondary | ICD-10-CM | POA: Diagnosis not present

## 2016-01-22 DIAGNOSIS — Z91040 Latex allergy status: Secondary | ICD-10-CM | POA: Insufficient documentation

## 2016-01-22 LAB — COMPREHENSIVE METABOLIC PANEL
ALBUMIN: 3.6 g/dL (ref 3.5–5.0)
ALT: 32 U/L (ref 14–54)
ANION GAP: 8 (ref 5–15)
AST: 29 U/L (ref 15–41)
Alkaline Phosphatase: 82 U/L (ref 38–126)
BILIRUBIN TOTAL: 0.4 mg/dL (ref 0.3–1.2)
BUN: 11 mg/dL (ref 6–20)
CHLORIDE: 103 mmol/L (ref 101–111)
CO2: 26 mmol/L (ref 22–32)
Calcium: 9.4 mg/dL (ref 8.9–10.3)
Creatinine, Ser: 0.7 mg/dL (ref 0.44–1.00)
GFR calc Af Amer: >60 mL/min (ref >60)
GFR calc non Af Amer: >60 mL/min (ref >60)
GLUCOSE: 119 mg/dL — AB (ref 65–99)
POTASSIUM: 3.7 mmol/L (ref 3.5–5.1)
SODIUM: 137 mmol/L (ref 135–145)
TOTAL PROTEIN: 6.5 g/dL (ref 6.5–8.1)

## 2016-01-22 LAB — CBC
HEMATOCRIT: 38.8 % (ref 36.0–46.0)
Hemoglobin: 13 g/dL (ref 12.0–15.0)
MCH: 29.7 pg (ref 26.0–34.0)
MCHC: 33.5 g/dL (ref 30.0–36.0)
MCV: 88.8 fL (ref 78.0–100.0)
PLATELETS: 339 10*3/uL (ref 150–400)
RBC: 4.37 MIL/uL (ref 3.87–5.11)
RDW: 13.1 % (ref 11.5–15.5)
WBC: 6.2 10*3/uL (ref 4.0–10.5)

## 2016-01-22 LAB — MAGNESIUM: Magnesium: 2.2 mg/dL (ref 1.7–2.4)

## 2016-01-22 LAB — TSH: TSH: 1.244 u[IU]/mL (ref 0.350–4.500)

## 2016-01-22 NOTE — Progress Notes (Signed)
Primary Care Physician: Gara Kroner, MD Referring Physician: Dr. Loralie Champagne is a 60 y.o. female that is in the [place] today for evaluation. She is regularly followed by Dr. Rayann Heman with one prior ablation at [facility] and has been on flecainide for years. Dose had been increased in the past but BBB increased so dose was reduced back to 50 mg bid. She is also on verapamil bid. She is on warfarin with last INR 2.7 10/12 .   She reports that since early October she has had daily afib. It is usually with controlled v rates in the 70's and only lasts a few hours but makes her feel very fatigued. She is interested in discussing with Dr. Rayann Heman another ablation or antiarrythmic.   Today, she denies symptoms of  chest pain,  orthopnea, PND, lower extremity edema, dizziness, presyncope, syncope, or neurologic sequela. Positive for fatigue, shortness of breath with afib episodes. The patient is tolerating medications without difficulties and is otherwise without complaint today.   Past Medical History:  Diagnosis Date  . Allergic rhinitis   . Asthma   . Atrial flutter ([place])    a. Remotely per notes.  . Colon polyps   . DVT (deep venous thrombosis) ([place])    a. 1980s x2 - between birth of two sons. Saw hematology - was told she has a hypercoagulable disorder, should be on Coumadin lifelong.  Marland Kitchen GERD (gastroesophageal reflux disease)   . Halothane adverse reaction    narrow small opening  . Hypercholesteremia   . Hyperglycemia    a. A1c 6.1 in 2014.  Marland Kitchen Normal cardiac stress test 06/2012  . Obesity   . OSA (obstructive sleep apnea)    a. Mild, did not tolerate OSA.  Marland Kitchen PAF (paroxysmal atrial fibrillation) ([place])    a. s/p afib ablation at [facility] 2010 (Dr. Annabell Howells). b. Recurrent AF s/p DCCV 06/2010. c. On flecainide.   Marland Kitchen PONV (postoperative nausea and vomiting)   . Rheumatoid arthritis(714.0)   . Wandering (atrial) pacemaker    a. Remotely per notes.   Past  Surgical History:  Procedure Laterality Date  . afib ablation  2010   at [facility]  . CARPAL TUNNEL RELEASE Bilateral   . CESAREAN SECTION    . CHOLECYSTECTOMY    . INGUINAL HERNIA REPAIR Right   . TMJ ARTHROPLASTY      Current Outpatient Prescriptions  Medication Sig Dispense Refill  . cetirizine (ZYRTEC) 10 MG tablet Take 10 mg by mouth daily.      . Cholecalciferol (VITAMIN D3) 1000 UNITS CAPS Take 2 capsules by mouth daily.     . flecainide (TAMBOCOR) 50 MG tablet TAKE 1 TABLET (50 MG TOTAL) BY MOUTH 2 (TWO) TIMES DAILY. 180 tablet 1  . fluticasone (FLONASE) 50 MCG/ACT nasal spray Place 1 spray into the nose 2 (two) times daily.     . fluticasone (FLOVENT HFA) 110 MCG/ACT inhaler Inhale 1 puff into the lungs 2 (two) times daily.    Marland Kitchen losartan (COZAAR) 25 MG tablet Take 1 tablet (25 mg total) by mouth daily. 90 tablet 0  . Multiple Vitamin (MULTIVITAMIN) tablet Take 1 tablet by mouth daily.      Marland Kitchen omeprazole (PRILOSEC) 40 MG capsule Take 40 mg by mouth daily.     Marland Kitchen venlafaxine XR (EFFEXOR-XR) 37.5 MG 24 hr capsule Take 37.5 mg by mouth every morning.    . verapamil (CALAN) 80 MG tablet Take 2  tablets by mouth 2 (two) times daily.   1  . warfarin (COUMADIN) 2 MG tablet Take as directed by coumadin clinic 210 tablet 1  . PROAIR HFA 108 (90 BASE) MCG/ACT inhaler Inhale 2 puffs into the lungs every 6 (six) hours as needed for wheezing or shortness of breath.      Current Facility-Administered Medications  Medication Dose Route Frequency Provider Last Rate Last Dose  . triamcinolone acetonide (KENALOG) 10 MG/ML injection 10 mg  10 mg Other Once Harriet Masson, DPM      . triamcinolone acetonide (KENALOG) 10 MG/ML injection 10 mg  10 mg Other Once Harriet Masson, DPM        Allergies  Allergen Reactions  . Aspirin Other (See Comments)    Makes asthma worse, makes stomach hurt  . Ivp Dye [Iodinated Diagnostic Agents] Anaphylaxis    Required epinephrine. Since then, has tolerated  with premed.  . Latex Other (See Comments)    Wheezing/cough   . Meperidine Hcl Other (See Comments)    Projectile vomiting   . Penicillins Anaphylaxis  . Statins Other (See Comments)    Severe leg pain Severe leg pain - has tried most of them except the newest one  . Sulfonamide Derivatives Anaphylaxis    Social History   Social History  . Marital status: Married    Spouse name: N/A  . Number of children: 2  . Years of education: N/A   Occupational History  . RN [place]   Social History Main Topics  . Smoking status: Never Smoker  . Smokeless tobacco: Never Used  . Alcohol use Yes     Comment: occasional beer - 1x/month  . Drug use: No  . Sexual activity: No   Other Topics Concern  . Not on file   Social History Narrative  . No narrative on file    Family History  Problem Relation Age of Onset  . Lupus Sister   . Stroke Mother   . Heart attack Mother     2 previous MIs, 3 stents - CAD first diagnosed 58  . Heart attack Paternal Grandfather     Died at 36 of massive MI  . Stroke Maternal Grandfather     Multiple family members  . Thyroid disease Mother   . Colon polyps Father   . Stroke Paternal Grandmother   . Thyroid disease Sister     x 2    ROS- All systems are reviewed and negative except as per the HPI above  Physical Exam: Vitals:   01/22/16 1525  BP: (!) 144/78  Pulse: 70  Weight: 243 lb 3.2 oz (110.3 kg)  Height: 5\' 5"  (1.651 m)    GEN- The patient is well appearing, alert and oriented x 3 today.   Head- normocephalic, atraumatic Eyes-  Sclera clear, conjunctiva pink Ears- hearing intact Oropharynx- clear Neck- supple, no JVP Lymph- no cervical lymphadenopathy Lungs- Clear to ausculation bilaterally, normal work of breathing Heart- Regular rate and rhythm, no murmurs, rubs or gallops, PMI not laterally displaced GI- soft, NT, ND, + BS Extremities- no clubbing, cyanosis, or edema MS- no significant deformity or  atrophy Skin- no rash or lesion Psych- euthymic mood, full affect Neuro- strength and sensation are intact  EKG- NSR at 70 bpm, pr int 192 ms, qrs int 98 ms, qtc 460 ms Epic records reviewed Echo-- Left ventricle: The cavity size was normal. Wall thickness was   increased in a pattern of mild LVH.  Systolic function was   vigorous. The estimated ejection fraction was in the range of 65%   to 70%. Wall motion was normal; there were no regional wall   motion abnormalities. Features are consistent with a pseudonormal   left ventricular filling pattern, with concomitant abnormal   relaxation and increased filling pressure (grade 2 diastolic   dysfunction). - Mitral valve: Calcified annulus. - Left atrium: The atrium was at the upper limits of normal in   size. 35 mm - Right atrium: Central venous pressure (est): 3 mm Hg. - Tricuspid valve: There was trivial regurgitation. - Pulmonary arteries: Systolic pressure could not be accurately   estimated. - Pericardium, extracardiac: A prominent pericardial fat pad was   present.  Impressions:  - Mild LVH with LVEF 65-70%. Grade 2 diastolic dysfunction with   increased LV filling pressure. Upper normal left atrial chamber   size. Mildly calcified mitral annulus. Trivial tricuspid   regurgitation. Prominent pericardial fat pad.  Assessment and Plan: 1. Paroxysmal afib, increase in afib burden Failing flecanide 50 mg bid and cannot increase dose due to BBB Continue verapamil and pt takes an extra dose occassionally to help restore SR Continue warfarin  Pt wants an appointment with Dr. Rayann Heman to discuss repeat ablation vrs change in antiarrythmic She will see him 11/6 at 8:45 am  Butch Penny C. Carroll, [facility] [address] [place], [place] [address] [phone]

## 2016-01-27 ENCOUNTER — Encounter: Payer: Self-pay | Admitting: Internal Medicine

## 2016-01-27 ENCOUNTER — Telehealth (HOSPITAL_COMMUNITY): Payer: Self-pay | Admitting: *Deleted

## 2016-01-27 ENCOUNTER — Ambulatory Visit (INDEPENDENT_AMBULATORY_CARE_PROVIDER_SITE_OTHER): Payer: 59 | Admitting: Internal Medicine

## 2016-01-27 VITALS — BP 128/72 | HR 83 | Ht 65.0 in | Wt 242.2 lb

## 2016-01-27 DIAGNOSIS — R002 Palpitations: Secondary | ICD-10-CM | POA: Diagnosis not present

## 2016-01-27 DIAGNOSIS — I480 Paroxysmal atrial fibrillation: Secondary | ICD-10-CM

## 2016-01-27 DIAGNOSIS — I10 Essential (primary) hypertension: Secondary | ICD-10-CM | POA: Diagnosis not present

## 2016-01-27 MED ORDER — FLECAINIDE ACETATE 100 MG PO TABS: 100.0000 mg | ORAL_TABLET | Freq: Two times a day (BID) | ORAL | 3 refills | Status: DC

## 2016-01-27 NOTE — Telephone Encounter (Signed)
LMOM for pt to clbk to sched 2 weeks f/u per Allred.Marland Kitchen

## 2016-01-27 NOTE — Progress Notes (Signed)
Electrophysiology Office Note   Date:  01/27/2016   ID:  KAZARIA MCKELL, DOB [id number], MRN [id number]  PCP:  Gara Kroner, MD   Primary Electrophysiologist: Thompson Grayer, MD    Chief Complaint  Patient presents with  . Follow-up    AFib     History of Present Illness: [name] is a 60 y.o. female who presents today for electrophysiology evaluation.  She feels that her palpitations have been more prominent over the past few weeks.  She is unaware of triggers/ precipitants.  She has been working on weight loss.   Today, she denies symptoms of orthopnea, PND, lower extremity edema, claudication, dizziness, presyncope, syncope, bleeding, or neurologic sequela. The patient is tolerating medications without difficulties and is otherwise without complaint today.    Past Medical History:  Diagnosis Date  . Allergic rhinitis   . Asthma   . Atrial flutter ([place])    a. Remotely per notes.  . Colon polyps   . DVT (deep venous thrombosis) ([place])    a. 1980s x2 - between birth of two sons. Saw hematology - was told she has a hypercoagulable disorder, should be on Coumadin lifelong.  Marland Kitchen GERD (gastroesophageal reflux disease)   . Halothane adverse reaction    narrow small opening  . Hypercholesteremia   . Hyperglycemia    a. A1c 6.1 in 2014.  Marland Kitchen Normal cardiac stress test 06/2012  . Obesity   . OSA (obstructive sleep apnea)    a. Mild, did not tolerate OSA.  Marland Kitchen PAF (paroxysmal atrial fibrillation) ([place])    a. s/p afib ablation at [facility] 2010 (Dr. Annabell Howells). b. Recurrent AF s/p DCCV 06/2010. c. On flecainide.   Marland Kitchen PONV (postoperative nausea and vomiting)   . Rheumatoid arthritis(714.0)   . Wandering (atrial) pacemaker    a. Remotely per notes.   Past Surgical History:  Procedure Laterality Date  . afib ablation  2010   at [facility]  . CARPAL TUNNEL RELEASE Bilateral   . CESAREAN SECTION    . CHOLECYSTECTOMY    . INGUINAL HERNIA REPAIR Right   . TMJ  ARTHROPLASTY       Current Outpatient Prescriptions  Medication Sig Dispense Refill  . cetirizine (ZYRTEC) 10 MG tablet Take 10 mg by mouth daily.      . Cholecalciferol (VITAMIN D3) 1000 UNITS CAPS Take 2 capsules by mouth daily.     . fluticasone (FLONASE) 50 MCG/ACT nasal spray Place 1 spray into the nose 2 (two) times daily.     . fluticasone (FLOVENT HFA) 110 MCG/ACT inhaler Inhale 1 puff into the lungs 2 (two) times daily.    Marland Kitchen losartan (COZAAR) 25 MG tablet Take 1 tablet (25 mg total) by mouth daily. 90 tablet 0  . Multiple Vitamin (MULTIVITAMIN) tablet Take 1 tablet by mouth daily.      Marland Kitchen omeprazole (PRILOSEC) 40 MG capsule Take 40 mg by mouth daily.     Marland Kitchen PROAIR HFA 108 (90 BASE) MCG/ACT inhaler Inhale 2 puffs into the lungs every 6 (six) hours as needed for wheezing or shortness of breath.     . venlafaxine XR (EFFEXOR-XR) 37.5 MG 24 hr capsule Take 37.5 mg by mouth every morning.    . verapamil (CALAN) 80 MG tablet Take 2 tablets by mouth 2 (two) times daily.   1  . warfarin (COUMADIN) 2 MG tablet Take as directed by coumadin clinic 210 tablet 1  . flecainide (TAMBOCOR) 100 MG tablet Take 1  tablet (100 mg total) by mouth 2 (two) times daily. 180 tablet 3   Current Facility-Administered Medications  Medication Dose Route Frequency Provider Last Rate Last Dose  . triamcinolone acetonide (KENALOG) 10 MG/ML injection 10 mg  10 mg Other Once Harriet Masson, DPM      . triamcinolone acetonide (KENALOG) 10 MG/ML injection 10 mg  10 mg Other Once Harriet Masson, DPM        Allergies:   Aspirin; Ivp dye [iodinated diagnostic agents]; Latex; Meperidine hcl; Penicillins; Statins; and Sulfonamide derivatives   Social History:  The patient  reports that she has never smoked. She has never used smokeless tobacco. She reports that she drinks alcohol. She reports that she does not use drugs.   Family History:  The patient's  family history includes Colon polyps in her father; Heart attack in  her mother and paternal grandfather; Lupus in her sister; Stroke in her maternal grandfather, mother, and paternal grandmother; Thyroid disease in her mother and sister.    ROS:  Please see the history of present illness.   All other systems are reviewed and negative.    PHYSICAL EXAM: VS:  BP 128/72   Pulse 83   Ht 5\' 5"  (1.651 m)   Wt 242 lb 3.2 oz (109.9 kg)   BMI 40.30 kg/m  , BMI Body mass index is 40.3 kg/m. GEN: Well nourished, well developed, in no acute distress  HEENT: normal  Neck: no JVD, carotid bruits, or masses Cardiac: RRR; no murmurs, rubs, or gallops,no edema  Respiratory:  clear to auscultation bilaterally, normal work of breathing GI: soft, nontender, nondistended, + BS MS: no deformity or atrophy  Skin: warm and dry  Neuro:  Strength and sensation are intact Psych: euthymic mood, full affect  EKG:  EKG is ordered today. The ekg ordered today shows sinus rhythm 83 bpm, Qtc 486 msec  Recent Labs: 01/22/2016: ALT 32; BUN 11; Creatinine, Ser 0.70; Hemoglobin 13.0; Magnesium 2.2; Platelets 339; Potassium 3.7; Sodium 137; TSH 1.244    Lipid Panel     Component Value Date/Time   CHOL 238 (H) 01/13/2013 0139   TRIG 247 (H) 01/13/2013 0139   HDL 48 01/13/2013 0139   CHOLHDL 5.0 01/13/2013 0139   VLDL 49 (H) 01/13/2013 0139   LDLCALC 141 (H) 01/13/2013 0139     Wt Readings from Last 3 Encounters:  01/27/16 242 lb 3.2 oz (109.9 kg)  01/22/16 243 lb 3.2 oz (110.3 kg)  08/21/15 248 lb (112.5 kg)    ASSESSMENT AND PLAN:  1. Afib/ palpitations I have reviewed multiple AliveCor strips.  They are all sinus with multifocal PACs.  There may be one regular tachycardia which could be atypical atrial flutter or atach.  I do not see afib. At this point, I would therefore favor ongoing medical therapy. We discussed options of switching verapamil to diltiazem.  She does not like that.  For some reason at [facility] she thinks that they told her that diltiazem  should be reserved for "last resort". I did discuss increasing flecainide back to 100mg  BID (we had previously weaned due to asymptomatic PR prolongation).  I also discussed multaq and tikosyn as options.  She would prefer to increase flecainide at this time. I will therefore increase flecainide to 100mg  BID Return to AF clinic in 2 weeks for ekg Return to see me in 6 weeks Lifestyle modification encouraged 6/17 echo is reviewed  2. HTN Stable No change required today 2 gram sodium diet  3. obestity I had a long discussion today about the importance of weight loss and exercsie.  She has lost 8 lbs since the summer. Body mass index is 40.3 kg/m.   4. Sleep apnea Mild She has previously seen Dr Maxwell Caul who recommends conservative measures    Current medicines are reviewed at length with the patient today.   The patient does not have concerns regarding her medicines.  The following changes were made today:  none  Labs/ tests ordered today include:  Orders Placed This Encounter  Procedures  . EKG 12-Lead    Signed, Thompson Grayer, MD  01/27/2016 10:51 AM     [facility] [address] [place] [address] [phone] (office) [phone] (fax)

## 2016-01-27 NOTE — Patient Instructions (Signed)
Medication Instructions:  Your physician has recommended you make the following change in your medication:  1) Increase Flecainide to 100 mg twice daily   Labwork: None ordered   Testing/Procedures: None ordered   Follow-Up: Your physician recommends that you schedule a follow-up appointment in: 2 weeks with Ceasar Lund and 6 weeks with Dr Rayann Heman    Any Other Special Instructions Will Be Listed Below (If Applicable).     If you need a refill on your cardiac medications before your next appointment, please call your pharmacy.

## 2016-02-11 ENCOUNTER — Encounter (HOSPITAL_COMMUNITY): Payer: Self-pay | Admitting: Nurse Practitioner

## 2016-02-11 ENCOUNTER — Ambulatory Visit (HOSPITAL_COMMUNITY)
Admission: RE | Admit: 2016-02-11 | Discharge: 2016-02-11 | Disposition: A | Discharge status: Home / Self Care | Payer: 59 | Source: Ambulatory Visit | Attending: Nurse Practitioner | Admitting: Nurse Practitioner

## 2016-02-11 DIAGNOSIS — I4510 Unspecified right bundle-branch block: Secondary | ICD-10-CM | POA: Insufficient documentation

## 2016-02-11 DIAGNOSIS — I4891 Unspecified atrial fibrillation: Secondary | ICD-10-CM | POA: Insufficient documentation

## 2016-02-11 NOTE — Progress Notes (Addendum)
Pt in for ekg after increasing flecainide to 100mg  bid. Roderic Palau NP to review EKG.  Pt is doing well with increase in flecainide, no further afib.. Intervals w/o significant change. NSR at 72 bpm,IRBBB, pr int 194 ms, qrs int 102 ms, qtc 464.F/u with Dr. Rayann Heman 12/20.

## 2016-03-04 ENCOUNTER — Encounter: Payer: Self-pay | Admitting: Internal Medicine

## 2016-03-11 ENCOUNTER — Ambulatory Visit (INDEPENDENT_AMBULATORY_CARE_PROVIDER_SITE_OTHER): Payer: 59 | Admitting: Internal Medicine

## 2016-03-11 ENCOUNTER — Encounter: Payer: Self-pay | Admitting: Internal Medicine

## 2016-03-11 ENCOUNTER — Ambulatory Visit (INDEPENDENT_AMBULATORY_CARE_PROVIDER_SITE_OTHER): Payer: 59 | Admitting: *Deleted

## 2016-03-11 VITALS — BP 124/82 | HR 78 | Ht 65.0 in | Wt 244.0 lb

## 2016-03-11 DIAGNOSIS — I4891 Unspecified atrial fibrillation: Secondary | ICD-10-CM

## 2016-03-11 DIAGNOSIS — Z5181 Encounter for therapeutic drug level monitoring: Secondary | ICD-10-CM

## 2016-03-11 DIAGNOSIS — I480 Paroxysmal atrial fibrillation: Secondary | ICD-10-CM

## 2016-03-11 DIAGNOSIS — I10 Essential (primary) hypertension: Secondary | ICD-10-CM | POA: Diagnosis not present

## 2016-03-11 LAB — POCT INR: INR: 2.2

## 2016-03-11 MED ORDER — FLECAINIDE ACETATE 50 MG PO TABS: 50.0000 mg | ORAL_TABLET | Freq: Two times a day (BID) | ORAL | 3 refills | Status: DC

## 2016-03-11 NOTE — Progress Notes (Signed)
Electrophysiology Office Note   Date:  03/11/2016   ID:  [name], DOB [id number], MRN [id number]  PCP:  Gara Kroner, MD   Primary Electrophysiologist: Thompson Grayer, MD    Chief Complaint  Patient presents with  . Atrial Fibrillation     History of Present Illness: [name] is a 60 y.o. female who presents today for electrophysiology evaluation.  She is very pleased that she has had no afib on increased flecainide.  She has noticed however increased abrupt onset of dizziness with mild presyncope.  Denies syncope.  She has been working on weight loss.  Sob is better with increased flecainide. Today, she denies symptoms of orthopnea, PND, lower extremity edema, claudication,  bleeding, or neurologic sequela. The patient is tolerating medications without difficulties and is otherwise without complaint today.    Past Medical History:  Diagnosis Date  . Allergic rhinitis   . Asthma   . Atrial flutter ([place])    a. Remotely per notes.  . Colon polyps   . DVT (deep venous thrombosis) ([place])    a. 1980s x2 - between birth of two sons. Saw hematology - was told she has a hypercoagulable disorder, should be on Coumadin lifelong.  Marland Kitchen GERD (gastroesophageal reflux disease)   . Halothane adverse reaction    narrow small opening  . Hypercholesteremia   . Hyperglycemia    a. A1c 6.1 in 2014.  Marland Kitchen Normal cardiac stress test 06/2012  . Obesity   . OSA (obstructive sleep apnea)    a. Mild, did not tolerate OSA.  Marland Kitchen PAF (paroxysmal atrial fibrillation) ([place])    a. s/p afib ablation at [facility] 2010 (Dr. Annabell Howells). b. Recurrent AF s/p DCCV 06/2010. c. On flecainide.   Marland Kitchen PONV (postoperative nausea and vomiting)   . Rheumatoid arthritis(714.0)   . Wandering (atrial) pacemaker    a. Remotely per notes.   Past Surgical History:  Procedure Laterality Date  . afib ablation  2010   at [facility]  . CARPAL TUNNEL RELEASE Bilateral   . CESAREAN SECTION    . CHOLECYSTECTOMY     . INGUINAL HERNIA REPAIR Right   . TMJ ARTHROPLASTY       Current Outpatient Prescriptions  Medication Sig Dispense Refill  . cetirizine (ZYRTEC) 10 MG tablet Take 10 mg by mouth daily.      . Cholecalciferol (VITAMIN D3) 1000 UNITS CAPS Take 2 capsules by mouth daily.     . flecainide (TAMBOCOR) 100 MG tablet Take 1 tablet (100 mg total) by mouth 2 (two) times daily. 180 tablet 3  . fluticasone (FLONASE) 50 MCG/ACT nasal spray Place 1 spray into the nose 2 (two) times daily.     . fluticasone (FLOVENT HFA) 110 MCG/ACT inhaler Inhale 1 puff into the lungs 2 (two) times daily.    Marland Kitchen losartan (COZAAR) 25 MG tablet Take 1 tablet (25 mg total) by mouth daily. 90 tablet 0  . Multiple Vitamin (MULTIVITAMIN) tablet Take 1 tablet by mouth daily.      Marland Kitchen omeprazole (PRILOSEC) 40 MG capsule Take 40 mg by mouth daily.     Marland Kitchen PROAIR HFA 108 (90 BASE) MCG/ACT inhaler Inhale 2 puffs into the lungs every 6 (six) hours as needed for wheezing or shortness of breath.     . venlafaxine XR (EFFEXOR-XR) 37.5 MG 24 hr capsule Take 37.5 mg by mouth every morning.    . verapamil (CALAN) 80 MG tablet Take 2 tablets by mouth 2 (two)  times daily.   1  . warfarin (COUMADIN) 2 MG tablet Take as directed by coumadin clinic 210 tablet 1   Current Facility-Administered Medications  Medication Dose Route Frequency Provider Last Rate Last Dose  . triamcinolone acetonide (KENALOG) 10 MG/ML injection 10 mg  10 mg Other Once Harriet Masson, DPM      . triamcinolone acetonide (KENALOG) 10 MG/ML injection 10 mg  10 mg Other Once Harriet Masson, DPM        Allergies:   Aspirin; Ivp dye [iodinated diagnostic agents]; Latex; Meperidine hcl; Penicillins; Statins; and Sulfonamide derivatives   Social History:  The patient  reports that she has never smoked. She has never used smokeless tobacco. She reports that she drinks alcohol. She reports that she does not use drugs.   Family History:  The patient's  family history includes  Colon polyps in her father; Heart attack in her mother and paternal grandfather; Lupus in her sister; Stroke in her maternal grandfather, mother, and paternal grandmother; Thyroid disease in her mother and sister.    ROS:  Please see the history of present illness.   All other systems are reviewed and negative.    PHYSICAL EXAM: VS:  BP 124/82   Pulse 78   Ht 5\' 5"  (1.651 m)   Wt 244 lb (110.7 kg)   BMI 40.60 kg/m  , BMI Body mass index is 40.6 kg/m. GEN: Well nourished, well developed, in no acute distress  HEENT: normal  Neck: no JVD, carotid bruits, or masses Cardiac: RRR; no murmurs, rubs, or gallops,no edema  Respiratory:  clear to auscultation bilaterally, normal work of breathing GI: soft, nontender, nondistended, + BS MS: no deformity or atrophy  Skin: warm and dry  Neuro:  Strength and sensation are intact Psych: euthymic mood, full affect  EKG:  EKG is ordered today. The ekg ordered today shows sinus rhythm 78 bpm, PR 218 msec, incomplete RBB and first degree AV block are both new when compared to 11/161/7  Recent Labs: 01/22/2016: ALT 32; BUN 11; Creatinine, Ser 0.70; Hemoglobin 13.0; Magnesium 2.2; Platelets 339; Potassium 3.7; Sodium 137; TSH 1.244    Lipid Panel     Component Value Date/Time   CHOL 238 (H) 01/13/2013 0139   TRIG 247 (H) 01/13/2013 0139   HDL 48 01/13/2013 0139   CHOLHDL 5.0 01/13/2013 0139   VLDL 49 (H) 01/13/2013 0139   LDLCALC 141 (H) 01/13/2013 0139     Wt Readings from Last 3 Encounters:  03/11/16 244 lb (110.7 kg)  02/11/16 241 lb 12.8 oz (109.7 kg)  01/27/16 242 lb 3.2 oz (109.9 kg)    ASSESSMENT AND PLAN:  1. Afib/ PACs Much improved with increased flecainide I am worried however by her new first degree AV block and incomplete RBB which are likely due to medicine increase.  She also has symptoms of abrupt dizziness which could be due to transient AV block.  I will therefore reduce flecainide back to 50mg  BID today. If  symptoms persist, she is encouraged to contact my office.  We may have to consider a different AAD or repeat ablation. Continue coumadin Lifestyle modification discussed today  2. HTN Stable No change required today 2 gram sodium diet  3. obestity I had a long discussion today about the importance of weight loss and exercsie.   Body mass index is 40.6 kg/m.  4. Sleep apnea Mild She has previously seen Dr Maxwell Caul who recommends conservative measures    Current medicines are reviewed  at length with the patient today.   The patient does not have concerns regarding her medicines.  The following changes were made today:  none  Return to see me in 6-8 weeks.  She will contact my with problems in the interim.   Army Fossa, MD  03/11/2016 2:30 PM     [address]  [phone] (office) [phone] (fax)

## 2016-03-11 NOTE — Patient Instructions (Signed)
Medication Instructions:  Your physician has recommended you make the following change in your medication:  1) Decrease Flecainide to 50 mg twice daily   Labwork: None ordered   Testing/Procedures: None ordered   Follow-Up: Your physician recommends that you schedule a follow-up appointment in: 6-8 weeks with Dr Rayann Heman   Any Other Special Instructions Will Be Listed Below (If Applicable).     If you need a refill on your cardiac medications before your next appointment, please call your pharmacy.

## 2016-03-17 ENCOUNTER — Encounter: Payer: Self-pay | Admitting: Internal Medicine

## 2016-03-18 ENCOUNTER — Telehealth: Payer: Self-pay | Admitting: Internal Medicine

## 2016-03-18 ENCOUNTER — Ambulatory Visit (HOSPITAL_COMMUNITY)
Admission: RE | Admit: 2016-03-18 | Discharge: 2016-03-18 | Disposition: A | Discharge status: Home / Self Care | Payer: 59 | Source: Ambulatory Visit | Attending: Nurse Practitioner | Admitting: Nurse Practitioner

## 2016-03-18 DIAGNOSIS — R9431 Abnormal electrocardiogram [ECG] [EKG]: Secondary | ICD-10-CM | POA: Insufficient documentation

## 2016-03-18 DIAGNOSIS — I4430 Unspecified atrioventricular block: Secondary | ICD-10-CM | POA: Insufficient documentation

## 2016-03-18 DIAGNOSIS — I480 Paroxysmal atrial fibrillation: Secondary | ICD-10-CM | POA: Insufficient documentation

## 2016-03-18 DIAGNOSIS — I4891 Unspecified atrial fibrillation: Secondary | ICD-10-CM | POA: Diagnosis present

## 2016-03-18 DIAGNOSIS — I491 Atrial premature depolarization: Secondary | ICD-10-CM | POA: Insufficient documentation

## 2016-03-18 DIAGNOSIS — I4510 Unspecified right bundle-branch block: Secondary | ICD-10-CM | POA: Diagnosis not present

## 2016-03-18 DIAGNOSIS — J45909 Unspecified asthma, uncomplicated: Secondary | ICD-10-CM | POA: Diagnosis not present

## 2016-03-18 MED ORDER — DILTIAZEM HCL 30 MG PO TABS: ORAL_TABLET | ORAL | 3 refills | Status: DC

## 2016-03-18 NOTE — Telephone Encounter (Signed)
Follow Up:    Pt says she she sent you a message through my chart. She wants to be sure you received it and read it It is very important,concerning her medicine.T

## 2016-03-18 NOTE — Telephone Encounter (Signed)
Dr. Rayann Heman had recommended pt come in for EKG today. Appt made

## 2016-03-18 NOTE — Patient Instructions (Signed)
Your physician has recommended you make the following change in your medication:  1)Cardizem 30mg -- take 1 tablet every 4 hours AS NEEDED for AFIB heart rate >100 as long as blood pressure >100.   

## 2016-03-18 NOTE — Progress Notes (Addendum)
Patient in for ekg after stopping flecainide. Roderic Palau NP to review.   Intervals are improved off flecainide but P waves are inverted. I gave her 30 mg cardizem to use if afib should occur and will staff message Dr. Rayann Heman for his advice for next step.

## 2016-03-19 ENCOUNTER — Telehealth (HOSPITAL_COMMUNITY): Payer: Self-pay | Admitting: *Deleted

## 2016-03-19 MED ORDER — DRONEDARONE HCL 400 MG PO TABS: 400.0000 mg | ORAL_TABLET | Freq: Two times a day (BID) | ORAL | 3 refills | Status: DC

## 2016-03-19 NOTE — Telephone Encounter (Signed)
Roderic Palau NP discussed case with Dr. Rayann Heman. Recommended starting Multaq 400mg  BID or could possibly try repeat ablation. Pt wanted to start multaq but is also interested in repeat ablation. RX for multaq sent in to pharmacy and repeat EKG appt made for Thursday 1/4. Pt stopped flecainide on 03/14/2016. Will forward to Dr. Rayann Heman for further review as to whether pt will be need to be seen in office or if ablation can be scheduled.

## 2016-03-26 ENCOUNTER — Ambulatory Visit (HOSPITAL_COMMUNITY)
Admission: RE | Admit: 2016-03-26 | Discharge: 2016-03-26 | Disposition: A | Discharge status: Home / Self Care | Payer: 59 | Source: Ambulatory Visit | Attending: Nurse Practitioner | Admitting: Nurse Practitioner

## 2016-03-26 ENCOUNTER — Encounter (HOSPITAL_COMMUNITY): Payer: Self-pay | Admitting: Nurse Practitioner

## 2016-03-26 DIAGNOSIS — I4891 Unspecified atrial fibrillation: Secondary | ICD-10-CM | POA: Insufficient documentation

## 2016-03-26 NOTE — Telephone Encounter (Signed)
Agree with Roderic Palau NP Please schedule follow-up with me to discuss ablation.

## 2016-03-26 NOTE — Progress Notes (Addendum)
Pt in for repeat EKG today.  HR 76, tracing reading NSR.  Ekg to be reviewed by Roderic Palau, NP  Pt started on multaq 400 mg bid, is tolerating well. EKG shows SR at 76  bpm, pr int 142 ms, qrs int 84 ms, qtc 470 ms. Sees Dr. Rayann Heman 1/31 for repeat ablation.

## 2016-04-15 ENCOUNTER — Ambulatory Visit (INDEPENDENT_AMBULATORY_CARE_PROVIDER_SITE_OTHER): Payer: 59 | Admitting: *Deleted

## 2016-04-15 ENCOUNTER — Encounter: Payer: Self-pay | Admitting: Internal Medicine

## 2016-04-15 ENCOUNTER — Ambulatory Visit (INDEPENDENT_AMBULATORY_CARE_PROVIDER_SITE_OTHER): Payer: 59 | Admitting: Internal Medicine

## 2016-04-15 VITALS — BP 132/86 | HR 83 | Ht 65.0 in | Wt 238.2 lb

## 2016-04-15 DIAGNOSIS — Z5181 Encounter for therapeutic drug level monitoring: Secondary | ICD-10-CM | POA: Diagnosis not present

## 2016-04-15 DIAGNOSIS — I480 Paroxysmal atrial fibrillation: Secondary | ICD-10-CM

## 2016-04-15 DIAGNOSIS — I10 Essential (primary) hypertension: Secondary | ICD-10-CM

## 2016-04-15 DIAGNOSIS — I4891 Unspecified atrial fibrillation: Secondary | ICD-10-CM

## 2016-04-15 LAB — POCT INR: INR: 1.8

## 2016-04-15 MED ORDER — WARFARIN SODIUM 2 MG PO TABS
ORAL_TABLET | ORAL | 1 refills | Status: DC
Start: 2016-04-15 — End: 2016-05-04

## 2016-04-15 MED ORDER — VERAPAMIL HCL 80 MG PO TABS: 80.0000 mg | ORAL_TABLET | Freq: Two times a day (BID) | ORAL | 3 refills | Status: DC

## 2016-04-15 NOTE — Patient Instructions (Addendum)
Medication Instructions:  Your physician has recommended you make the following change in your medication:  1) Decrease Verapamil to 80 mg twice daily    Labwork: Your physician recommends that you return for lab work on 04/23/16 at 4:30pm ---do not have to fast  Will need weekly INR's   Testing/Procedures: Your physician has requested that you have cardiac CT. Cardiac computed tomography (CT) is a painless test that uses an x-ray machine to take clear, detailed pictures of your heart. For further information please visit [facility]. Please follow instruction sheet as given.----week of 04/27/16  Your physician has recommended that you have an ablation. Catheter ablation is a medical procedure used to treat some cardiac arrhythmias (irregular heartbeats). During catheter ablation, a long, thin, flexible tube is put into a blood vessel in your groin (upper thigh), or neck. This tube is called an ablation catheter. It is then guided to your heart through the blood vessel. Radio frequency waves destroy small areas of heart tissue where abnormal heartbeats may cause an arrhythmia to start. Please see the instruction sheet given to you today.--05/07/16  Please check in at [facility] at 8:30am Do not eat or drink after midnight the night prior to your procedure Do not take any medications the morning of the procedure Plan for one night stay Will need someone to drive you home at discharge    Follow-Up: Your physician recommends that you schedule a follow-up appointment in: 4 weeks from 05/07/16 in afib clinic with Roderic Palau, NP and 3 months from 05/07/16 with Dr Rayann Heman   Any Other Special Instructions Will Be Listed Below (If Applicable).     If you need a refill on your cardiac medications before your next appointment, please call your pharmacy.

## 2016-04-15 NOTE — Progress Notes (Signed)
Electrophysiology Office Note   Date:  04/15/2016   ID:  [name], DOB [id number], MRN [id number]  PCP:  Gara Kroner, MD   Primary Electrophysiologist: Thompson Grayer, MD    Chief Complaint  Patient presents with  . Paroxysmal atrial fibrillation (HCC)     History of Present Illness: [name] is a 61 y.o. female who presents today for electrophysiology evaluation.  Though her afib is improved with recently started multaq, she does not feel well.  She has frequent first degree AV block and sinus bradycardia.  Her dizzines is improved.  SHe remains fatigued and has poor exercise tolerance.  She has occasional pain between her shoulders.  Today, she denies symptoms of orthopnea, PND, lower extremity edema, claudication,  bleeding, or neurologic sequela. The patient is tolerating medications without difficulties and is otherwise without complaint today.    Past Medical History:  Diagnosis Date  . Allergic rhinitis   . Asthma   . Atrial flutter ([place])    a. Remotely per notes.  . Colon polyps   . DVT (deep venous thrombosis) ([place])    a. 1980s x2 - between birth of two sons. Saw hematology - was told she has a hypercoagulable disorder, should be on Coumadin lifelong.  Marland Kitchen GERD (gastroesophageal reflux disease)   . Halothane adverse reaction    narrow small opening  . Hypercholesteremia   . Hyperglycemia    a. A1c 6.1 in 2014.  Marland Kitchen Normal cardiac stress test 06/2012  . Obesity   . OSA (obstructive sleep apnea)    a. Mild, did not tolerate OSA.  Marland Kitchen PAF (paroxysmal atrial fibrillation) ([place])    a. s/p afib ablation at [facility] 2010 (Dr. Annabell Howells). b. Recurrent AF s/p DCCV 06/2010. c. On flecainide.   Marland Kitchen PONV (postoperative nausea and vomiting)   . Rheumatoid arthritis(714.0)   . Wandering (atrial) pacemaker    a. Remotely per notes.   Past Surgical History:  Procedure Laterality Date  . afib ablation  2010   at [facility]  . CARPAL TUNNEL RELEASE Bilateral     . CESAREAN SECTION    . CHOLECYSTECTOMY    . INGUINAL HERNIA REPAIR Right   . TMJ ARTHROPLASTY       Current Outpatient Prescriptions  Medication Sig Dispense Refill  . cetirizine (ZYRTEC) 10 MG tablet Take 10 mg by mouth daily.      . Cholecalciferol (VITAMIN D3) 1000 UNITS CAPS Take 2 capsules by mouth daily.     Marland Kitchen diltiazem (CARDIZEM) 30 MG tablet Take 1 tablet by mouth every 4 hours AS NEEDED for AFIB heart rate >100 as long as blood pressure >100. 45 tablet 3  . dronedarone (MULTAQ) 400 MG tablet Take 1 tablet (400 mg total) by mouth 2 (two) times daily with a meal. 60 tablet 3  . fluticasone (FLONASE) 50 MCG/ACT nasal spray Place 1 spray into the nose 2 (two) times daily.     . fluticasone (FLOVENT HFA) 110 MCG/ACT inhaler Inhale 1 puff into the lungs 2 (two) times daily.    Marland Kitchen losartan (COZAAR) 25 MG tablet Take 1 tablet (25 mg total) by mouth daily. 90 tablet 0  . Multiple Vitamin (MULTIVITAMIN) tablet Take 1 tablet by mouth daily.      Marland Kitchen omeprazole (PRILOSEC) 40 MG capsule Take 40 mg by mouth daily.     Marland Kitchen PROAIR HFA 108 (90 BASE) MCG/ACT inhaler Inhale 2 puffs into the lungs every 6 (six) hours as needed for  wheezing or shortness of breath.     . venlafaxine XR (EFFEXOR-XR) 37.5 MG 24 hr capsule Take 37.5 mg by mouth every morning.    . verapamil (CALAN) 80 MG tablet Take 2 tablets by mouth 2 (two) times daily.   1  . warfarin (COUMADIN) 2 MG tablet Take as directed by coumadin clinic 210 tablet 1   Current Facility-Administered Medications  Medication Dose Route Frequency Provider Last Rate Last Dose  . triamcinolone acetonide (KENALOG) 10 MG/ML injection 10 mg  10 mg Other Once Harriet Masson, DPM      . triamcinolone acetonide (KENALOG) 10 MG/ML injection 10 mg  10 mg Other Once Harriet Masson, DPM        Allergies:   Aspirin; Ivp dye [iodinated diagnostic agents]; Latex; Meperidine hcl; Penicillins; Statins; and Sulfonamide derivatives   Social History:  The patient   reports that she has never smoked. She has never used smokeless tobacco. She reports that she drinks alcohol. She reports that she does not use drugs.   Family History:  The patient's  family history includes Colon polyps in her father; Heart attack in her mother and paternal grandfather; Lupus in her sister; Stroke in her maternal grandfather, mother, and paternal grandmother; Thyroid disease in her mother and sister.    ROS:  Please see the history of present illness.   All other systems are reviewed and negative.    PHYSICAL EXAM: VS:  BP 132/86   Pulse 83   Ht 5\' 5"  (1.651 m)   Wt 238 lb 3.2 oz (108 kg)   SpO2 98%   BMI 39.64 kg/m  , BMI Body mass index is 39.64 kg/m. GEN: Well nourished, well developed, in no acute distress  HEENT: normal  Neck: no JVD, carotid bruits, or masses Cardiac: RRR; no murmurs, rubs, or gallops,no edema  Respiratory:  clear to auscultation bilaterally, normal work of breathing GI: soft, nontender, nondistended, + BS MS: no deformity or atrophy  Skin: warm and dry  Neuro:  Strength and sensation are intact Psych: euthymic mood, full affect  EKG:  EKG is ordered today. The ekg ordered today shows sinus rhythm 80 bpm, PR 126 msec,  Recent Labs: 01/22/2016: ALT 32; BUN 11; Creatinine, Ser 0.70; Hemoglobin 13.0; Magnesium 2.2; Platelets 339; Potassium 3.7; Sodium 137; TSH 1.244    Lipid Panel     Component Value Date/Time   CHOL 238 (H) 01/13/2013 0139   TRIG 247 (H) 01/13/2013 0139   HDL 48 01/13/2013 0139   CHOLHDL 5.0 01/13/2013 0139   VLDL 49 (H) 01/13/2013 0139   LDLCALC 141 (H) 01/13/2013 0139     Wt Readings from Last 3 Encounters:  04/15/16 238 lb 3.2 oz (108 kg)  03/11/16 244 lb (110.7 kg)  02/11/16 241 lb 12.8 oz (109.7 kg)    ASSESSMENT AND PLAN:  1. Afib/ PACs She reports symptoms with AAD therapy.  She would like stop this medicine currently Therapeutic strategies for afib including medicine and ablation were discussed in  detail with the patient today. Risk, benefits, and alternatives to EP study and radiofrequency ablation for afib were also discussed in detail today. These risks include but are not limited to stroke, bleeding, vascular damage, tamponade, perforation, damage to the esophagus, lungs, and other structures, pulmonary vein stenosis, worsening renal function, and death. The patient understands these risk and wishes to proceed.  We will therefore proceed with catheter ablation at the next available time.  Will obtain cardiac CT prior to  ablation  Given first degree AV block with symptoms, reduce verapamil to 80mg  BID  2. HTN Stable No change required today 2 gram sodium diet  3. obestity I had a long discussion today about the importance of weight loss and exercsie.   Body mass index is 39.64 kg/m.  4. Sleep apnea Mild She has previously seen Dr Maxwell Caul who recommends conservative measures   Current medicines are reviewed at length with the patient today.   The patient does not have concerns regarding her medicines.  The following changes were made today:  none     Signed, Thompson Grayer, MD  04/15/2016 3:10 PM     [address] [address] [phone] (office) [phone] (fax)

## 2016-04-17 ENCOUNTER — Encounter: Payer: Self-pay | Admitting: Internal Medicine

## 2016-04-22 ENCOUNTER — Ambulatory Visit: Payer: 59 | Admitting: Internal Medicine

## 2016-04-23 ENCOUNTER — Other Ambulatory Visit: Payer: 59 | Admitting: *Deleted

## 2016-04-23 ENCOUNTER — Ambulatory Visit (INDEPENDENT_AMBULATORY_CARE_PROVIDER_SITE_OTHER): Payer: 59 | Admitting: *Deleted

## 2016-04-23 DIAGNOSIS — E7800 Pure hypercholesterolemia, unspecified: Secondary | ICD-10-CM | POA: Diagnosis not present

## 2016-04-23 DIAGNOSIS — I480 Paroxysmal atrial fibrillation: Secondary | ICD-10-CM | POA: Diagnosis not present

## 2016-04-23 DIAGNOSIS — I10 Essential (primary) hypertension: Secondary | ICD-10-CM | POA: Diagnosis not present

## 2016-04-23 DIAGNOSIS — I4891 Unspecified atrial fibrillation: Secondary | ICD-10-CM

## 2016-04-23 DIAGNOSIS — Z5181 Encounter for therapeutic drug level monitoring: Secondary | ICD-10-CM | POA: Diagnosis not present

## 2016-04-23 LAB — POCT INR: INR: 2.1

## 2016-04-23 NOTE — Addendum Note (Signed)
Addended by: Eulis Foster on: 04/23/2016 04:09 PM   Modules accepted: Orders

## 2016-04-24 LAB — CBC WITH DIFFERENTIAL/PLATELET
Basophils Absolute: 0.1 10*3/uL (ref 0.0–0.2)
Basos: 1 %
EOS (ABSOLUTE): 0.1 10*3/uL (ref 0.0–0.4)
EOS: 2 %
HEMATOCRIT: 38 % (ref 34.0–46.6)
HEMOGLOBIN: 12.7 g/dL (ref 11.1–15.9)
Immature Grans (Abs): 0 10*3/uL (ref 0.0–0.1)
Immature Granulocytes: 0 %
LYMPHS ABS: 3.2 10*3/uL — AB (ref 0.7–3.1)
Lymphs: 52 %
MCH: 30 pg (ref 26.6–33.0)
MCHC: 33.4 g/dL (ref 31.5–35.7)
MCV: 90 fL (ref 79–97)
MONOCYTES: 11 %
Monocytes Absolute: 0.6 10*3/uL (ref 0.1–0.9)
NEUTROS ABS: 2 10*3/uL (ref 1.4–7.0)
Neutrophils: 34 %
Platelets: 389 10*3/uL — ABNORMAL HIGH (ref 150–379)
RBC: 4.24 x10E6/uL (ref 3.77–5.28)
RDW: 14 % (ref 12.3–15.4)
WBC: 6 10*3/uL (ref 3.4–10.8)

## 2016-04-24 LAB — BASIC METABOLIC PANEL
BUN/Creatinine Ratio: 29 — ABNORMAL HIGH (ref 12–28)
BUN: 17 mg/dL (ref 8–27)
CO2: 24 mmol/L (ref 18–29)
CREATININE: 0.59 mg/dL (ref 0.57–1.00)
Calcium: 9.7 mg/dL (ref 8.7–10.3)
Chloride: 100 mmol/L (ref 96–106)
GFR, EST AFRICAN AMERICAN: 114 mL/min/{1.73_m2} (ref >59)
GFR, EST NON AFRICAN AMERICAN: 99 mL/min/{1.73_m2} (ref >59)
Glucose: 137 mg/dL — ABNORMAL HIGH (ref 65–99)
Potassium: 3.7 mmol/L (ref 3.5–5.2)
Sodium: 139 mmol/L (ref 134–144)

## 2016-04-24 LAB — PROTIME-INR
INR: 1.8 — ABNORMAL HIGH (ref 0.8–1.2)
PROTHROMBIN TIME: 18.4 s — AB (ref 9.1–12.0)

## 2016-05-01 ENCOUNTER — Ambulatory Visit (INDEPENDENT_AMBULATORY_CARE_PROVIDER_SITE_OTHER): Payer: 59 | Admitting: *Deleted

## 2016-05-01 ENCOUNTER — Ambulatory Visit (HOSPITAL_COMMUNITY)
Admission: RE | Admit: 2016-05-01 | Discharge: 2016-05-01 | Disposition: A | Discharge status: Home / Self Care | Payer: 59 | Source: Ambulatory Visit | Attending: Internal Medicine | Admitting: Internal Medicine

## 2016-05-01 DIAGNOSIS — I480 Paroxysmal atrial fibrillation: Secondary | ICD-10-CM

## 2016-05-01 DIAGNOSIS — Z5181 Encounter for therapeutic drug level monitoring: Secondary | ICD-10-CM | POA: Diagnosis not present

## 2016-05-01 DIAGNOSIS — K760 Fatty (change of) liver, not elsewhere classified: Secondary | ICD-10-CM | POA: Insufficient documentation

## 2016-05-01 DIAGNOSIS — I4891 Unspecified atrial fibrillation: Secondary | ICD-10-CM

## 2016-05-01 DIAGNOSIS — K449 Diaphragmatic hernia without obstruction or gangrene: Secondary | ICD-10-CM | POA: Insufficient documentation

## 2016-05-01 LAB — POCT INR: INR: 2.6

## 2016-05-01 MED ORDER — METOPROLOL TARTRATE 5 MG/5ML IV SOLN
INTRAVENOUS | Status: AC
  Filled 2016-05-01: qty 5

## 2016-05-01 MED ORDER — IOPAMIDOL (ISOVUE-370) INJECTION 76%
INTRAVENOUS | Status: AC
  Administered 2016-05-01: 100 mL
  Filled 2016-05-01: qty 100

## 2016-05-01 MED ORDER — METOPROLOL TARTRATE 5 MG/5ML IV SOLN
5.0000 mg | INTRAVENOUS | Status: DC | PRN
  Administered 2016-05-01: 5 mg via INTRAVENOUS

## 2016-05-01 MED ORDER — NITROGLYCERIN 0.4 MG SL SUBL
0.8000 mg | SUBLINGUAL_TABLET | Freq: Once | SUBLINGUAL | Status: AC
  Administered 2016-05-01: 0.8 mg via SUBLINGUAL

## 2016-05-01 MED ORDER — NITROGLYCERIN 0.4 MG SL SUBL
SUBLINGUAL_TABLET | SUBLINGUAL | Status: AC
  Filled 2016-05-01: qty 2

## 2016-05-02 ENCOUNTER — Other Ambulatory Visit: Payer: Self-pay | Admitting: Internal Medicine

## 2016-05-06 ENCOUNTER — Ambulatory Visit (INDEPENDENT_AMBULATORY_CARE_PROVIDER_SITE_OTHER): Payer: 59 | Admitting: *Deleted

## 2016-05-06 DIAGNOSIS — Z5181 Encounter for therapeutic drug level monitoring: Secondary | ICD-10-CM | POA: Diagnosis not present

## 2016-05-06 DIAGNOSIS — I480 Paroxysmal atrial fibrillation: Secondary | ICD-10-CM

## 2016-05-06 DIAGNOSIS — I4891 Unspecified atrial fibrillation: Secondary | ICD-10-CM | POA: Diagnosis not present

## 2016-05-06 LAB — POCT INR: INR: 2.5

## 2016-05-07 ENCOUNTER — Ambulatory Visit (HOSPITAL_COMMUNITY)
Admission: RE | Admit: 2016-05-07 | Discharge: 2016-05-08 | Disposition: A | Discharge status: Home / Self Care | Payer: 59 | Source: Ambulatory Visit | Attending: Internal Medicine | Admitting: Internal Medicine

## 2016-05-07 ENCOUNTER — Ambulatory Visit (HOSPITAL_COMMUNITY): Payer: 59 | Admitting: Anesthesiology

## 2016-05-07 ENCOUNTER — Encounter (HOSPITAL_COMMUNITY): Payer: Self-pay | Admitting: Anesthesiology

## 2016-05-07 ENCOUNTER — Encounter (HOSPITAL_COMMUNITY)
Admission: RE | Disposition: A | Discharge status: Home / Self Care | Payer: Self-pay | Source: Ambulatory Visit | Attending: Internal Medicine

## 2016-05-07 DIAGNOSIS — I4891 Unspecified atrial fibrillation: Secondary | ICD-10-CM | POA: Diagnosis present

## 2016-05-07 DIAGNOSIS — I4892 Unspecified atrial flutter: Secondary | ICD-10-CM | POA: Diagnosis not present

## 2016-05-07 DIAGNOSIS — Z91040 Latex allergy status: Secondary | ICD-10-CM | POA: Diagnosis not present

## 2016-05-07 DIAGNOSIS — Z6839 Body mass index (BMI) 39.0-39.9, adult: Secondary | ICD-10-CM | POA: Diagnosis not present

## 2016-05-07 DIAGNOSIS — Z91041 Radiographic dye allergy status: Secondary | ICD-10-CM | POA: Diagnosis not present

## 2016-05-07 DIAGNOSIS — G4733 Obstructive sleep apnea (adult) (pediatric): Secondary | ICD-10-CM | POA: Diagnosis not present

## 2016-05-07 DIAGNOSIS — E7800 Pure hypercholesterolemia, unspecified: Secondary | ICD-10-CM | POA: Insufficient documentation

## 2016-05-07 DIAGNOSIS — I480 Paroxysmal atrial fibrillation: Secondary | ICD-10-CM | POA: Diagnosis not present

## 2016-05-07 DIAGNOSIS — R739 Hyperglycemia, unspecified: Secondary | ICD-10-CM | POA: Diagnosis not present

## 2016-05-07 DIAGNOSIS — Z880 Allergy status to penicillin: Secondary | ICD-10-CM | POA: Diagnosis not present

## 2016-05-07 DIAGNOSIS — Z8249 Family history of ischemic heart disease and other diseases of the circulatory system: Secondary | ICD-10-CM | POA: Diagnosis not present

## 2016-05-07 DIAGNOSIS — I440 Atrioventricular block, first degree: Secondary | ICD-10-CM | POA: Insufficient documentation

## 2016-05-07 DIAGNOSIS — Z7901 Long term (current) use of anticoagulants: Secondary | ICD-10-CM | POA: Insufficient documentation

## 2016-05-07 DIAGNOSIS — K219 Gastro-esophageal reflux disease without esophagitis: Secondary | ICD-10-CM | POA: Insufficient documentation

## 2016-05-07 DIAGNOSIS — I483 Typical atrial flutter: Secondary | ICD-10-CM | POA: Diagnosis not present

## 2016-05-07 DIAGNOSIS — Z86718 Personal history of other venous thrombosis and embolism: Secondary | ICD-10-CM | POA: Insufficient documentation

## 2016-05-07 DIAGNOSIS — I10 Essential (primary) hypertension: Secondary | ICD-10-CM | POA: Diagnosis not present

## 2016-05-07 DIAGNOSIS — Z882 Allergy status to sulfonamides status: Secondary | ICD-10-CM | POA: Insufficient documentation

## 2016-05-07 DIAGNOSIS — M069 Rheumatoid arthritis, unspecified: Secondary | ICD-10-CM | POA: Insufficient documentation

## 2016-05-07 DIAGNOSIS — J45909 Unspecified asthma, uncomplicated: Secondary | ICD-10-CM | POA: Diagnosis not present

## 2016-05-07 DIAGNOSIS — K5900 Constipation, unspecified: Secondary | ICD-10-CM | POA: Insufficient documentation

## 2016-05-07 DIAGNOSIS — Z823 Family history of stroke: Secondary | ICD-10-CM | POA: Diagnosis not present

## 2016-05-07 HISTORY — DX: Cardiac murmur, unspecified: R01.1

## 2016-05-07 HISTORY — PX: ATRIAL ABLATION SURGERY: SHX560

## 2016-05-07 HISTORY — PX: ATRIAL FIBRILLATION ABLATION: EP1191

## 2016-05-07 HISTORY — DX: Unspecified osteoarthritis, unspecified site: M19.90

## 2016-05-07 HISTORY — DX: Respiratory syncytial virus as the cause of diseases classified elsewhere: B97.4

## 2016-05-07 HISTORY — DX: Family history of other specified conditions: Z84.89

## 2016-05-07 HISTORY — DX: Nonspecific reaction to tuberculin skin test without active tuberculosis: R76.11

## 2016-05-07 HISTORY — DX: Personal history of other diseases of the digestive system: Z87.19

## 2016-05-07 HISTORY — DX: Other specified viral diseases: B33.8

## 2016-05-07 LAB — POCT ACTIVATED CLOTTING TIME
ACTIVATED CLOTTING TIME: 279 s
ACTIVATED CLOTTING TIME: 296 s
Activated Clotting Time: 351 seconds
Activated Clotting Time: 169 seconds

## 2016-05-07 LAB — PROTIME-INR
INR: 2.01
Prothrombin Time: 23.1 seconds — ABNORMAL HIGH (ref 11.4–15.2)

## 2016-05-07 SURGERY — ATRIAL FIBRILLATION ABLATION
Anesthesia: General

## 2016-05-07 MED ORDER — LOSARTAN POTASSIUM 50 MG PO TABS: 25.0000 mg | ORAL_TABLET | Freq: Every day | ORAL | Status: DC

## 2016-05-07 MED ORDER — IOPAMIDOL (ISOVUE-370) INJECTION 76%
INTRAVENOUS | Status: AC
Start: 2016-05-07 — End: 2016-05-07
  Filled 2016-05-07: qty 50

## 2016-05-07 MED ORDER — HYDROCODONE-ACETAMINOPHEN 5-325 MG PO TABS
ORAL_TABLET | ORAL | Status: AC
  Filled 2016-05-07: qty 1

## 2016-05-07 MED ORDER — BUDESONIDE 0.5 MG/2ML IN SUSP
0.5000 mg | Freq: Two times a day (BID) | RESPIRATORY_TRACT | Status: DC
  Administered 2016-05-07: 21:00:00 0.5 mg via RESPIRATORY_TRACT
  Filled 2016-05-07 (×3): qty 2

## 2016-05-07 MED ORDER — LIDOCAINE HCL (CARDIAC) 20 MG/ML IV SOLN
INTRAVENOUS | Status: DC | PRN
  Administered 2016-05-07: 20 mg via INTRAVENOUS

## 2016-05-07 MED ORDER — FENTANYL CITRATE (PF) 100 MCG/2ML IJ SOLN
INTRAMUSCULAR | Status: DC | PRN
Start: 2016-05-07 — End: 2016-05-07
  Administered 2016-05-07: 50 ug via INTRAVENOUS
  Administered 2016-05-07 (×2): 25 ug via INTRAVENOUS
  Administered 2016-05-07 (×2): 50 ug via INTRAVENOUS

## 2016-05-07 MED ORDER — WARFARIN SODIUM 6 MG PO TABS
6.0000 mg | ORAL_TABLET | Freq: Once | ORAL | Status: AC
  Administered 2016-05-07: 6 mg via ORAL
  Filled 2016-05-07: qty 1

## 2016-05-07 MED ORDER — DIPHENHYDRAMINE HCL 50 MG/ML IJ SOLN
INTRAMUSCULAR | Status: AC
  Administered 2016-05-07: 50 mg via INTRAVENOUS
  Filled 2016-05-07: qty 1

## 2016-05-07 MED ORDER — HYDROCODONE-ACETAMINOPHEN 5-325 MG PO TABS
1.0000 | ORAL_TABLET | ORAL | Status: DC | PRN
  Administered 2016-05-07 (×2): 1 via ORAL
  Filled 2016-05-07: qty 1

## 2016-05-07 MED ORDER — HEPARIN SODIUM (PORCINE) 1000 UNIT/ML IJ SOLN
INTRAMUSCULAR | Status: DC | PRN
  Administered 2016-05-07 (×2): 1000 [IU] via INTRAVENOUS

## 2016-05-07 MED ORDER — DEXAMETHASONE SODIUM PHOSPHATE 10 MG/ML IJ SOLN
INTRAMUSCULAR | Status: DC | PRN
  Administered 2016-05-07: 10 mg via INTRAVENOUS

## 2016-05-07 MED ORDER — VENLAFAXINE HCL ER 37.5 MG PO CP24
37.5000 mg | ORAL_CAPSULE | Freq: Every day | ORAL | Status: DC
Start: 2016-05-08 — End: 2016-05-08
  Filled 2016-05-07: qty 1

## 2016-05-07 MED ORDER — SCOPOLAMINE 1 MG/3DAYS TD PT72
MEDICATED_PATCH | TRANSDERMAL | Status: DC | PRN
  Administered 2016-05-07: 1 via TRANSDERMAL

## 2016-05-07 MED ORDER — HEPARIN SODIUM (PORCINE) 1000 UNIT/ML IJ SOLN
INTRAMUSCULAR | Status: DC | PRN
  Administered 2016-05-07: 3000 [IU] via INTRAVENOUS
  Administered 2016-05-07: 10000 [IU] via INTRAVENOUS
  Administered 2016-05-07: 3000 [IU] via INTRAVENOUS

## 2016-05-07 MED ORDER — BUPIVACAINE HCL (PF) 0.25 % IJ SOLN
INTRAMUSCULAR | Status: AC
  Filled 2016-05-07: qty 30

## 2016-05-07 MED ORDER — ISOPROTERENOL HCL 0.2 MG/ML IJ SOLN
INTRAMUSCULAR | Status: AC
  Filled 2016-05-07: qty 5

## 2016-05-07 MED ORDER — ALBUTEROL SULFATE (2.5 MG/3ML) 0.083% IN NEBU: 3.0000 mL | INHALATION_SOLUTION | Freq: Four times a day (QID) | RESPIRATORY_TRACT | Status: DC | PRN

## 2016-05-07 MED ORDER — SODIUM CHLORIDE 0.9 % IV SOLN
INTRAVENOUS | Status: DC
  Administered 2016-05-07 (×2): via INTRAVENOUS

## 2016-05-07 MED ORDER — DEXTROSE 5 % IV SOLN
INTRAVENOUS | Status: DC | PRN
  Administered 2016-05-07: 20 ug/min via INTRAVENOUS

## 2016-05-07 MED ORDER — DIPHENHYDRAMINE HCL 50 MG/ML IJ SOLN
50.0000 mg | Freq: Once | INTRAMUSCULAR | Status: AC
  Administered 2016-05-07: 50 mg via INTRAVENOUS

## 2016-05-07 MED ORDER — ADENOSINE 6 MG/2ML IV SOLN
INTRAVENOUS | Status: AC
  Filled 2016-05-07: qty 16

## 2016-05-07 MED ORDER — HEPARIN SODIUM (PORCINE) 1000 UNIT/ML IJ SOLN
INTRAMUSCULAR | Status: AC
  Filled 2016-05-07: qty 1

## 2016-05-07 MED ORDER — SODIUM CHLORIDE 0.9% FLUSH: 3.0000 mL | INTRAVENOUS | Status: DC | PRN

## 2016-05-07 MED ORDER — WARFARIN - PHYSICIAN DOSING INPATIENT: Freq: Every day | Status: DC

## 2016-05-07 MED ORDER — SODIUM CHLORIDE 0.9% FLUSH: 3.0000 mL | Freq: Two times a day (BID) | INTRAVENOUS | Status: DC

## 2016-05-07 MED ORDER — MIDAZOLAM HCL 5 MG/5ML IJ SOLN
INTRAMUSCULAR | Status: DC | PRN
  Administered 2016-05-07: 2 mg via INTRAVENOUS

## 2016-05-07 MED ORDER — PROPOFOL 10 MG/ML IV BOLUS
INTRAVENOUS | Status: DC | PRN
  Administered 2016-05-07: 20 mg via INTRAVENOUS
  Administered 2016-05-07: 100 mg via INTRAVENOUS
  Administered 2016-05-07: 200 mg via INTRAVENOUS

## 2016-05-07 MED ORDER — ONDANSETRON HCL 4 MG/2ML IJ SOLN: 4.0000 mg | Freq: Four times a day (QID) | INTRAMUSCULAR | Status: DC | PRN

## 2016-05-07 MED ORDER — SCOPOLAMINE 1 MG/3DAYS TD PT72
MEDICATED_PATCH | TRANSDERMAL | Status: AC
  Filled 2016-05-07: qty 1

## 2016-05-07 MED ORDER — BUPIVACAINE HCL (PF) 0.25 % IJ SOLN
INTRAMUSCULAR | Status: DC | PRN
  Administered 2016-05-07: 20 mL

## 2016-05-07 MED ORDER — PHENYLEPHRINE HCL 10 MG/ML IJ SOLN
INTRAMUSCULAR | Status: DC | PRN
  Administered 2016-05-07: 40 ug via INTRAVENOUS

## 2016-05-07 MED ORDER — ONDANSETRON HCL 4 MG/2ML IJ SOLN
INTRAMUSCULAR | Status: DC | PRN
  Administered 2016-05-07: 4 mg via INTRAVENOUS

## 2016-05-07 MED ORDER — PROTAMINE SULFATE 10 MG/ML IV SOLN
INTRAVENOUS | Status: DC | PRN
  Administered 2016-05-07 (×3): 10 mg via INTRAVENOUS

## 2016-05-07 MED ORDER — ACETAMINOPHEN 325 MG PO TABS: 650.0000 mg | ORAL_TABLET | ORAL | Status: DC | PRN

## 2016-05-07 MED ORDER — VERAPAMIL HCL 80 MG PO TABS
80.0000 mg | ORAL_TABLET | Freq: Two times a day (BID) | ORAL | Status: DC
  Filled 2016-05-07: qty 1

## 2016-05-07 MED ORDER — IOPAMIDOL (ISOVUE-370) INJECTION 76%: INTRAVENOUS | Status: DC | PRN

## 2016-05-07 MED ORDER — OFF THE BEAT BOOK
Freq: Once | Status: AC
  Administered 2016-05-07: 22:00:00
  Filled 2016-05-07: qty 1

## 2016-05-07 MED ORDER — SODIUM CHLORIDE 0.9 % IV SOLN: 250.0000 mL | INTRAVENOUS | Status: DC | PRN

## 2016-05-07 SURGICAL SUPPLY — 19 items
BAG SNAP BAND KOVER 36X36 (MISCELLANEOUS) ×2 IMPLANT
BLANKET WARM UNDERBOD FULL ACC (MISCELLANEOUS) ×2 IMPLANT
CATH NAVISTAR SMARTTOUCH DF (ABLATOR) ×2 IMPLANT
CATH SOUNDSTAR 3D IMAGING (CATHETERS) ×2 IMPLANT
CATH VARIABLE LASSO NAV 2515 (CATHETERS) ×2 IMPLANT
CATH WEBSTER BI DIR CS D-F CRV (CATHETERS) ×2 IMPLANT
COVER SWIFTLINK CONNECTOR (BAG) ×2 IMPLANT
NEEDLE TRANSEP BRK 71CM 407200 (NEEDLE) ×2 IMPLANT
PACK EP LATEX FREE (CUSTOM PROCEDURE TRAY) ×1
PACK EP LF (CUSTOM PROCEDURE TRAY) ×1 IMPLANT
PAD DEFIB LIFELINK (PAD) ×2 IMPLANT
PATCH CARTO3 (PAD) ×2 IMPLANT
SHEATH AVANTI 11F 11CM (SHEATH) ×2 IMPLANT
SHEATH PINNACLE 7F 10CM (SHEATH) ×4 IMPLANT
SHEATH PINNACLE 8F 10CM (SHEATH) ×2 IMPLANT
SHEATH PINNACLE 9F 10CM (SHEATH) ×2 IMPLANT
SHEATH SWARTZ TS SL2 63CM 8.5F (SHEATH) ×2 IMPLANT
SHIELD RADPAD SCOOP 12X17 (MISCELLANEOUS) ×2 IMPLANT
TUBING COOLFLOW (TUBING) ×2 IMPLANT

## 2016-05-07 NOTE — Discharge Instructions (Signed)
No driving for 1 week. No lifting over 5 lbs for 1 week. No vigorous or sexual activity for 1 week. You may return to work on 05/14/16. Keep procedure site clean & dry. If you notice increased pain, swelling, bleeding or pus, call/return!  You may shower, but no soaking baths/hot tubs/pools for 1 week.    You have an appointment set up with the [facility].  Multiple studies have shown that being followed by a dedicated atrial fibrillation clinic in addition to the standard care you receive from your other physicians improves health. We believe that enrollment in the atrial fibrillation clinic will allow [place] to better care for you.   The phone number to the [facility] is [phone]. The clinic is staffed Monday through Friday from 8:30am to 5pm.  Parking Directions: The clinic is located in the Heart and Vascular Building connected to [facility]. 1)From [address] turn on to [address] and go to the 3rd entrance  (Heart and Vascular entrance) on the right. 2)Look to the right for Heart &Vascular Parking Garage. 3)A code for the entrance is required please call the clinic to receive this.   4)Take the elevators to the 1st floor. Registration is in the room with the glass walls at the end of the hallway.  If you have any trouble parking or locating the clinic, please dont hesitate to call [phone].

## 2016-05-07 NOTE — Interval H&P Note (Signed)
History and Physical Interval Note:  05/07/2016 11:53 AM  [name]  has presented today for surgery, with the diagnosis of afib  The various methods of treatment have been discussed with the patient and family. After consideration of risks, benefits and other options for treatment, the patient has consented to  Procedure(s): Atrial Fibrillation Ablation (N/A) as a surgical intervention .  The patient's history has been reviewed, patient examined, no change in status, stable for surgery.  I have reviewed the patient's chart and labs.  Questions were answered to the patient's satisfaction.    Cardiac CT reviewed with patient today.  Therapeutic INR.  Thompson Grayer

## 2016-05-07 NOTE — H&P (View-Only) (Signed)
Electrophysiology Office Note   Date:  04/15/2016   ID:  [name], DOB [id number], MRN [id number]  PCP:  Gara Kroner, MD   Primary Electrophysiologist: Thompson Grayer, MD    Chief Complaint  Patient presents with  . Paroxysmal atrial fibrillation (HCC)     History of Present Illness: [name] is a 61 y.o. female who presents today for electrophysiology evaluation.  Though her afib is improved with recently started multaq, she does not feel well.  She has frequent first degree AV block and sinus bradycardia.  Her dizzines is improved.  SHe remains fatigued and has poor exercise tolerance.  She has occasional pain between her shoulders.  Today, she denies symptoms of orthopnea, PND, lower extremity edema, claudication,  bleeding, or neurologic sequela. The patient is tolerating medications without difficulties and is otherwise without complaint today.    Past Medical History:  Diagnosis Date  . Allergic rhinitis   . Asthma   . Atrial flutter ([place])    a. Remotely per notes.  . Colon polyps   . DVT (deep venous thrombosis) ([place])    a. 1980s x2 - between birth of two sons. Saw hematology - was told she has a hypercoagulable disorder, should be on Coumadin lifelong.  Marland Kitchen GERD (gastroesophageal reflux disease)   . Halothane adverse reaction    narrow small opening  . Hypercholesteremia   . Hyperglycemia    a. A1c 6.1 in 2014.  Marland Kitchen Normal cardiac stress test 06/2012  . Obesity   . OSA (obstructive sleep apnea)    a. Mild, did not tolerate OSA.  Marland Kitchen PAF (paroxysmal atrial fibrillation) ([place])    a. s/p afib ablation at [facility] 2010 (Dr. Annabell Howells). b. Recurrent AF s/p DCCV 06/2010. c. On flecainide.   Marland Kitchen PONV (postoperative nausea and vomiting)   . Rheumatoid arthritis(714.0)   . Wandering (atrial) pacemaker    a. Remotely per notes.   Past Surgical History:  Procedure Laterality Date  . afib ablation  2010   at [facility]  . CARPAL TUNNEL RELEASE Bilateral     . CESAREAN SECTION    . CHOLECYSTECTOMY    . INGUINAL HERNIA REPAIR Right   . TMJ ARTHROPLASTY       Current Outpatient Prescriptions  Medication Sig Dispense Refill  . cetirizine (ZYRTEC) 10 MG tablet Take 10 mg by mouth daily.      . Cholecalciferol (VITAMIN D3) 1000 UNITS CAPS Take 2 capsules by mouth daily.     Marland Kitchen diltiazem (CARDIZEM) 30 MG tablet Take 1 tablet by mouth every 4 hours AS NEEDED for AFIB heart rate >100 as long as blood pressure >100. 45 tablet 3  . dronedarone (MULTAQ) 400 MG tablet Take 1 tablet (400 mg total) by mouth 2 (two) times daily with a meal. 60 tablet 3  . fluticasone (FLONASE) 50 MCG/ACT nasal spray Place 1 spray into the nose 2 (two) times daily.     . fluticasone (FLOVENT HFA) 110 MCG/ACT inhaler Inhale 1 puff into the lungs 2 (two) times daily.    Marland Kitchen losartan (COZAAR) 25 MG tablet Take 1 tablet (25 mg total) by mouth daily. 90 tablet 0  . Multiple Vitamin (MULTIVITAMIN) tablet Take 1 tablet by mouth daily.      Marland Kitchen omeprazole (PRILOSEC) 40 MG capsule Take 40 mg by mouth daily.     Marland Kitchen PROAIR HFA 108 (90 BASE) MCG/ACT inhaler Inhale 2 puffs into the lungs every 6 (six) hours as needed for  wheezing or shortness of breath.     . venlafaxine XR (EFFEXOR-XR) 37.5 MG 24 hr capsule Take 37.5 mg by mouth every morning.    . verapamil (CALAN) 80 MG tablet Take 2 tablets by mouth 2 (two) times daily.   1  . warfarin (COUMADIN) 2 MG tablet Take as directed by coumadin clinic 210 tablet 1   Current Facility-Administered Medications  Medication Dose Route Frequency Provider Last Rate Last Dose  . triamcinolone acetonide (KENALOG) 10 MG/ML injection 10 mg  10 mg Other Once Harriet Masson, DPM      . triamcinolone acetonide (KENALOG) 10 MG/ML injection 10 mg  10 mg Other Once Harriet Masson, DPM        Allergies:   Aspirin; Ivp dye [iodinated diagnostic agents]; Latex; Meperidine hcl; Penicillins; Statins; and Sulfonamide derivatives   Social History:  The patient   reports that she has never smoked. She has never used smokeless tobacco. She reports that she drinks alcohol. She reports that she does not use drugs.   Family History:  The patient's  family history includes Colon polyps in her father; Heart attack in her mother and paternal grandfather; Lupus in her sister; Stroke in her maternal grandfather, mother, and paternal grandmother; Thyroid disease in her mother and sister.    ROS:  Please see the history of present illness.   All other systems are reviewed and negative.    PHYSICAL EXAM: VS:  BP 132/86   Pulse 83   Ht 5\' 5"  (1.651 m)   Wt 238 lb 3.2 oz (108 kg)   SpO2 98%   BMI 39.64 kg/m  , BMI Body mass index is 39.64 kg/m. GEN: Well nourished, well developed, in no acute distress  HEENT: normal  Neck: no JVD, carotid bruits, or masses Cardiac: RRR; no murmurs, rubs, or gallops,no edema  Respiratory:  clear to auscultation bilaterally, normal work of breathing GI: soft, nontender, nondistended, + BS MS: no deformity or atrophy  Skin: warm and dry  Neuro:  Strength and sensation are intact Psych: euthymic mood, full affect  EKG:  EKG is ordered today. The ekg ordered today shows sinus rhythm 80 bpm, PR 126 msec,  Recent Labs: 01/22/2016: ALT 32; BUN 11; Creatinine, Ser 0.70; Hemoglobin 13.0; Magnesium 2.2; Platelets 339; Potassium 3.7; Sodium 137; TSH 1.244    Lipid Panel     Component Value Date/Time   CHOL 238 (H) 01/13/2013 0139   TRIG 247 (H) 01/13/2013 0139   HDL 48 01/13/2013 0139   CHOLHDL 5.0 01/13/2013 0139   VLDL 49 (H) 01/13/2013 0139   LDLCALC 141 (H) 01/13/2013 0139     Wt Readings from Last 3 Encounters:  04/15/16 238 lb 3.2 oz (108 kg)  03/11/16 244 lb (110.7 kg)  02/11/16 241 lb 12.8 oz (109.7 kg)    ASSESSMENT AND PLAN:  1. Afib/ PACs She reports symptoms with AAD therapy.  She would like stop this medicine currently Therapeutic strategies for afib including medicine and ablation were discussed in  detail with the patient today. Risk, benefits, and alternatives to EP study and radiofrequency ablation for afib were also discussed in detail today. These risks include but are not limited to stroke, bleeding, vascular damage, tamponade, perforation, damage to the esophagus, lungs, and other structures, pulmonary vein stenosis, worsening renal function, and death. The patient understands these risk and wishes to proceed.  We will therefore proceed with catheter ablation at the next available time.  Will obtain cardiac CT prior to  ablation  Given first degree AV block with symptoms, reduce verapamil to 80mg  BID  2. HTN Stable No change required today 2 gram sodium diet  3. obestity I had a long discussion today about the importance of weight loss and exercsie.   Body mass index is 39.64 kg/m.  4. Sleep apnea Mild She has previously seen Dr Maxwell Caul who recommends conservative measures   Current medicines are reviewed at length with the patient today.   The patient does not have concerns regarding her medicines.  The following changes were made today:  none     Signed, Thompson Grayer, MD  04/15/2016 3:10 PM     [address] [place] [address] [phone] (office) [phone] (fax)

## 2016-05-07 NOTE — Discharge Summary (Signed)
ELECTROPHYSIOLOGY PROCEDURE DISCHARGE SUMMARY    Patient ID: [name],  MRN: [id number], DOB/AGE: [birth year] 61 y.o.  Admit date: 05/07/2016 Discharge date: 05/08/16  Primary Care Physician: Gara Kroner, MD  Electrophysiologist: Thompson Grayer, MD  Primary Discharge Diagnosis:  1. Paroxysmal Afib     CHA2DS2Vasc is 2 on Warfarin, monitored and managed by the coumadin clinic     Next INR 05/15/16  Secondary Discharge Diagnosis:  1. HTN 2. Obesity 3. Mild sleep apnea  Procedures This Admission:  1.  Electrophysiology study and radiofrequency catheter ablation on 05/07/16 by Dr Thompson Grayer.    Brief HPI: VANASSA SCHLINK is a 61 y.o. female with a history of paroxysmal atrial fibrillation.  They have failed medical therapy with flecainide and multaq. Risks, benefits, and alternatives to catheter ablation of atrial fibrillation were reviewed with the patient who wished to proceed.  The patient underwent cardiac CT prior to the procedure which demonstrated no LAA thrombus.    Hospital Course:  The patient was admitted and underwent EPS/RFCA of atrial fibrillation with details as outlined above.  They were monitored on telemetry overnight which demonstrated sinus rhythm.  Groin was without complication on the day of discharge.  The patient was examined and considered to be stable for discharge.  Wound care and restrictions were reviewed with the patient.  The patient will be seen back by Roderic Palau, NP in 4 weeks and Dr Rayann Heman in 12 weeks for post ablation follow up.     Physical Exam: Vitals:   05/07/16 1902 05/07/16 2000 05/07/16 2037 05/08/16 0351  BP: (!) 157/76   (!) 125/49  Pulse: 79 87 84 65  Resp: 17 16 14 19   Temp: 97.5 F (36.4 C)   98.1 F (36.7 C)  TempSrc: Oral   Oral  SpO2: 95% 93% 98% 94%  Weight:    245 lb 12.8 oz (111.5 kg)  Height:        GEN- The patient is well appearing, alert and oriented x 3 today.   HEENT: normocephalic, atraumatic;  sclera clear, conjunctiva pink; hearing intact; oropharynx clear; neck supple  Lungs-  CTA b/l, normal work of breathing.  No wheezes, rales, rhonchi Heart-  RRR, no murmurs, rubs or gallops  GI- soft, non-tender, non-distended, bowel sounds present  Extremities- no clubbing, cyanosis, or edema; DP/PT/radial pulses 2+ bilaterally,  groin without hematoma/bruit MS- no significant deformity or atrophy Skin- warm and dry, no rash or lesion Psych- euthymic mood, full affect Neuro- strength and sensation are intact   Labs:   Lab Results  Component Value Date   WBC 6.0 04/23/2016   HGB 13.0 01/22/2016   HCT 38.0 04/23/2016   MCV 90 04/23/2016   PLT 389 (H) 04/23/2016   No results for input(s): NA, K, CL, CO2, BUN, CREATININE, CALCIUM, PROT, BILITOT, ALKPHOS, ALT, AST, GLUCOSE in the last 168 hours.  Invalid input(s): LABALBU   Discharge Medications:  Allergies as of 05/08/2016      Reactions   Aspirin Other (See Comments)   Makes asthma worse, makes stomach hurt   Ivp Dye [iodinated Diagnostic Agents] Anaphylaxis   Required epinephrine. Since then, has tolerated with premed.   Latex Other (See Comments)   Wheezing/cough   Meperidine Hcl Other (See Comments)   Projectile vomiting   Penicillins Anaphylaxis   Has patient had a PCN reaction causing immediate rash, facial/tongue/throat swelling, SOB or lightheadedness with hypotension:Yes Has patient had a PCN reaction causing severe rash involving mucus  membranes or skin necrosis:Yes Has patient had a PCN reaction that required hospitalization:Treated in ER w/epi & breathing treatments Has patient had a PCN reaction occurring within the last 10 years:No If all of the above answers are "NO", then may proceed with Cephalosporin use.   Statins Other (See Comments)   Severe leg pain Severe leg pain - has tried most of them except the newest one   Sulfonamide Derivatives Anaphylaxis   Food    Nuts-mucus membranes  inflamed/cough Bananas-mucus membranes inflamed/cough      Medication List    STOP taking these medications   dronedarone 400 MG tablet Commonly known as:  MULTAQ     TAKE these medications   acetaminophen 650 MG CR tablet Commonly known as:  TYLENOL Take 1,300 mg by mouth 2 (two) times daily.   cetirizine 10 MG tablet Commonly known as:  ZYRTEC Take 10 mg by mouth daily.   diltiazem 30 MG tablet Commonly known as:  CARDIZEM Take 1 tablet by mouth every 4 hours AS NEEDED for AFIB heart rate >100 as long as blood pressure >100. What changed:  how much to take  how to take this  when to take this  reasons to take this  additional instructions   FLOVENT HFA 220 MCG/ACT inhaler Generic drug:  fluticasone Inhale 1 puff into the lungs 2 (two) times daily.   fluticasone 50 MCG/ACT nasal spray Commonly known as:  FLONASE Place 1 spray into the nose 2 (two) times daily.   losartan 25 MG tablet Commonly known as:  COZAAR Take 1 tablet (25 mg total) by mouth daily.   MIRALAX packet Generic drug:  polyethylene glycol Take 17 g by mouth daily as needed (for constipation.).   multivitamin with minerals Tabs tablet Take 1 tablet by mouth daily.   omeprazole 40 MG capsule Commonly known as:  PRILOSEC Take 40 mg by mouth daily.   PROAIR HFA 108 (90 Base) MCG/ACT inhaler Generic drug:  albuterol Inhale 2 puffs into the lungs every 6 (six) hours as needed for wheezing or shortness of breath.   venlafaxine XR 37.5 MG 24 hr capsule Commonly known as:  EFFEXOR-XR Take 37.5 mg by mouth daily with breakfast.   verapamil 80 MG tablet Commonly known as:  CALAN Take 1 tablet (80 mg total) by mouth 2 (two) times daily.   Vitamin D3 1000 units Caps Take 2,000 Units by mouth daily.   warfarin 2 MG tablet Commonly known as:  COUMADIN TAKE AS DIRECTED BY COUMADIN CLINIC What changed:  See the new instructions.      Due to subtherapeutic INR, she received IV lovenox 110  mg x 1 on day of discharge.  Coumadin increased to 6mg  daily over the weekend.  INR check  05/08/16.   Disposition:   Follow-up Information    [facility] Follow up on 05/11/2016.   Specialty:  Cardiology Why:  coumadin clinic/lab draw Contact information: [address], [address] Follow up on 06/04/2016.   Specialty:  Cardiology Why:  3:30PM Contact information: [id number] [place] [phone]       Thompson Grayer, MD Follow up on 08/10/2016.   Specialty:  Cardiology Why:  12:00PM, (noon) Contact information: [address] [place] [address] [phone]           Duration of Discharge Encounter:  Greater than 30 minutes including physician time.  Venetia Night, PA-C 05/08/2016 7:24 AM

## 2016-05-07 NOTE — Progress Notes (Signed)
Site area: left groin sheath pulled and pressure held by Suella Broad Site Prior to Removal:  Level 0 Pressure Applied For: 15 minutes Manual:   yes Patient Status During Pull:  stable Post Pull Site:  Level 0 Post Pull Instructions Given:  yes Post Pull Pulses Present: yes Dressing Applied:  yes Bedrest begins @  [id number] Comments:

## 2016-05-07 NOTE — Progress Notes (Signed)
Site area: rt groin Site Prior to Removal:  Level 0 Pressure Applied For:  20 minutes Manual:   yes Patient Status During Pull:  stable Post Pull Site:  Level 0 Post Pull Instructions Given:  yes Post Pull Pulses Present: yes Dressing Applied:  Gauze and tegaderm Bedrest begins @ [id number] Comments:

## 2016-05-07 NOTE — Anesthesia Preprocedure Evaluation (Addendum)
Anesthesia Evaluation  Patient identified by MRN, date of birth, ID band Patient awake    Reviewed: Allergy & Precautions, NPO status , Patient's Chart, lab work & pertinent test results  History of Anesthesia Complications (+) PONV  Airway Mallampati: III  TM Distance: >3 FB Neck ROM: Full    Dental  (+) Dental Advisory Given, Chipped   Pulmonary asthma , sleep apnea (does not need CPAP since mild) ,    breath sounds clear to auscultation       Cardiovascular hypertension, Pt. on medications (-) angina+ DVT  + dysrhythmias Atrial Fibrillation  Rhythm:Regular Rate:Normal  '17 ECHO: EF 65-70%, valves OK   Neuro/Psych negative neurological ROS     GI/Hepatic Neg liver ROS, GERD  Medicated and Controlled,  Endo/Other  Morbid obesity  Renal/GU negative Renal ROS     Musculoskeletal   Abdominal (+) + obese,   Peds  Hematology  (+) Blood dyscrasia, , Coumadin: INR 2.01   Anesthesia Other Findings   Reproductive/Obstetrics                            Anesthesia Physical Anesthesia Plan  ASA: III  Anesthesia Plan: General   Post-op Pain Management:    Induction: Intravenous  Airway Management Planned: LMA  Additional Equipment:   Intra-op Plan:   Post-operative Plan:   Informed Consent: I have reviewed the patients History and Physical, chart, labs and discussed the procedure including the risks, benefits and alternatives for the proposed anesthesia with the patient or authorized representative who has indicated his/her understanding and acceptance.   Dental advisory given  Plan Discussed with: CRNA and Surgeon  Anesthesia Plan Comments: (Plan routine monitors, GA- LMA OK)        Anesthesia Quick Evaluation

## 2016-05-07 NOTE — Anesthesia Procedure Notes (Signed)
Procedure Name: LMA Insertion Date/Time: 05/07/2016 12:39 PM Performed by: Luciana Axe K Pre-anesthesia Checklist: Patient identified, Emergency Drugs available, Suction available and Patient being monitored Patient Re-evaluated:Patient Re-evaluated prior to inductionOxygen Delivery Method: Circle System Utilized Preoxygenation: Pre-oxygenation with 100% oxygen Intubation Type: IV induction Ventilation: Mask ventilation without difficulty LMA: LMA inserted LMA Size: 4.0 Number of attempts: 1 Airway Equipment and Method: Bite block Placement Confirmation: positive ETCO2 and breath sounds checked- equal and bilateral Tube secured with: Tape Dental Injury: Teeth and Oropharynx as per pre-operative assessment

## 2016-05-07 NOTE — Transfer of Care (Signed)
Immediate Anesthesia Transfer of Care Note  Patient: [name]  Procedure(s) Performed: Procedure(s): Atrial Fibrillation Ablation (N/A)  Patient Location: Cath Lab  Anesthesia Type:General  Level of Consciousness: awake, oriented and patient cooperative  Airway & Oxygen Therapy: Patient Spontanous Breathing and Patient connected to face mask oxygen  Post-op Assessment: Report given to RN and Post -op Vital signs reviewed and stable  Post vital signs: Reviewed  Last Vitals:  Vitals:   05/07/16 0849 05/07/16 1608  BP: (!) 165/94   Pulse: 69   Resp: 18   Temp: 36.9 C 36.4 C    Last Pain:  Vitals:   05/07/16 1608  TempSrc: Temporal         Complications: No apparent anesthesia complications

## 2016-05-08 ENCOUNTER — Encounter (HOSPITAL_COMMUNITY): Payer: Self-pay | Admitting: Internal Medicine

## 2016-05-08 DIAGNOSIS — G4733 Obstructive sleep apnea (adult) (pediatric): Secondary | ICD-10-CM | POA: Diagnosis not present

## 2016-05-08 DIAGNOSIS — I10 Essential (primary) hypertension: Secondary | ICD-10-CM | POA: Diagnosis not present

## 2016-05-08 DIAGNOSIS — K219 Gastro-esophageal reflux disease without esophagitis: Secondary | ICD-10-CM | POA: Diagnosis not present

## 2016-05-08 DIAGNOSIS — I480 Paroxysmal atrial fibrillation: Secondary | ICD-10-CM | POA: Diagnosis not present

## 2016-05-08 DIAGNOSIS — I4892 Unspecified atrial flutter: Secondary | ICD-10-CM | POA: Diagnosis not present

## 2016-05-08 DIAGNOSIS — K5900 Constipation, unspecified: Secondary | ICD-10-CM | POA: Diagnosis not present

## 2016-05-08 DIAGNOSIS — Z6839 Body mass index (BMI) 39.0-39.9, adult: Secondary | ICD-10-CM | POA: Diagnosis not present

## 2016-05-08 DIAGNOSIS — I440 Atrioventricular block, first degree: Secondary | ICD-10-CM | POA: Diagnosis not present

## 2016-05-08 LAB — PROTIME-INR
INR: 1.81
Prothrombin Time: 21.2 seconds — ABNORMAL HIGH (ref 11.4–15.2)

## 2016-05-08 MED ORDER — ENOXAPARIN SODIUM 120 MG/0.8ML ~~LOC~~ SOLN
1.0000 mg/kg | SUBCUTANEOUS | Status: AC
  Administered 2016-05-08: 08:00:00 110 mg via SUBCUTANEOUS
  Filled 2016-05-08: qty 0.74

## 2016-05-08 MED ORDER — WARFARIN SODIUM 6 MG PO TABS
6.0000 mg | ORAL_TABLET | ORAL | Status: AC
  Administered 2016-05-08: 08:00:00 6 mg via ORAL
  Filled 2016-05-08: qty 1

## 2016-05-08 NOTE — Care Management Note (Signed)
Case Management Note  Patient Details  Name: [name] MRN: [id number] Date of Birth: 08/01/1955  Subjective/Objective:    S/p afib ablation, for dc today, no needs.                Action/Plan:   Expected Discharge Date:  05/08/16               Expected Discharge Plan:  Home/Self Care  In-House Referral:     Discharge planning Services  CM Consult  Post Acute Care Choice:    Choice offered to:     DME Arranged:    DME Agency:     HH Arranged:    HH Agency:     Status of Service:  Completed, signed off  If discussed at [employer] of Stay Meetings, dates discussed:    Additional Comments:  S[place], Sheldon, RN 05/08/2016, 9:03 AM

## 2016-05-08 NOTE — Anesthesia Postprocedure Evaluation (Signed)
Anesthesia Post Note  Patient: [name]  Procedure(s) Performed: Procedure(s) (LRB): Atrial Fibrillation Ablation (N/A)  Patient location during evaluation: PACU Anesthesia Type: General Level of consciousness: awake and alert, oriented and patient cooperative Pain management: pain level controlled Vital Signs Assessment: post-procedure vital signs reviewed and stable Respiratory status: spontaneous breathing, nonlabored ventilation, respiratory function stable and patient connected to nasal cannula oxygen Cardiovascular status: blood pressure returned to baseline and stable Postop Assessment: no signs of nausea or vomiting Anesthetic complications: no       Last Vitals:  Vitals:   05/08/16 0351 05/08/16 0728  BP: (!) 125/49 137/65  Pulse: 65 77  Resp: 19 19  Temp: 36.7 C 36.8 C    Last Pain:  Vitals:   05/08/16 0728  TempSrc: Oral  PainSc:                  Saira Kramme,E. Sanela Evola

## 2016-05-08 NOTE — Progress Notes (Signed)
Pt fully dressed to go home waiting on discharge papers.

## 2016-05-11 ENCOUNTER — Ambulatory Visit (INDEPENDENT_AMBULATORY_CARE_PROVIDER_SITE_OTHER): Payer: 59

## 2016-05-11 DIAGNOSIS — I480 Paroxysmal atrial fibrillation: Secondary | ICD-10-CM | POA: Diagnosis not present

## 2016-05-11 DIAGNOSIS — Z5181 Encounter for therapeutic drug level monitoring: Secondary | ICD-10-CM

## 2016-05-11 DIAGNOSIS — I4891 Unspecified atrial fibrillation: Secondary | ICD-10-CM

## 2016-05-11 LAB — POCT INR: INR: 2.5

## 2016-05-27 ENCOUNTER — Ambulatory Visit (INDEPENDENT_AMBULATORY_CARE_PROVIDER_SITE_OTHER): Payer: 59 | Admitting: *Deleted

## 2016-05-27 DIAGNOSIS — I4891 Unspecified atrial fibrillation: Secondary | ICD-10-CM | POA: Diagnosis not present

## 2016-05-27 DIAGNOSIS — I480 Paroxysmal atrial fibrillation: Secondary | ICD-10-CM

## 2016-05-27 DIAGNOSIS — Z5181 Encounter for therapeutic drug level monitoring: Secondary | ICD-10-CM | POA: Diagnosis not present

## 2016-05-27 LAB — POCT INR: INR: 3

## 2016-06-04 ENCOUNTER — Encounter (HOSPITAL_COMMUNITY): Payer: Self-pay | Admitting: Nurse Practitioner

## 2016-06-04 ENCOUNTER — Ambulatory Visit (HOSPITAL_COMMUNITY)
Admission: RE | Admit: 2016-06-04 | Discharge: 2016-06-04 | Disposition: A | Discharge status: Home / Self Care | Payer: 59 | Source: Ambulatory Visit | Attending: Nurse Practitioner | Admitting: Nurse Practitioner

## 2016-06-04 VITALS — BP 136/76 | HR 73 | Ht 65.0 in | Wt 236.0 lb

## 2016-06-04 DIAGNOSIS — Z882 Allergy status to sulfonamides status: Secondary | ICD-10-CM | POA: Insufficient documentation

## 2016-06-04 DIAGNOSIS — I4891 Unspecified atrial fibrillation: Secondary | ICD-10-CM | POA: Diagnosis present

## 2016-06-04 DIAGNOSIS — I480 Paroxysmal atrial fibrillation: Secondary | ICD-10-CM | POA: Diagnosis not present

## 2016-06-04 DIAGNOSIS — Z91018 Allergy to other foods: Secondary | ICD-10-CM | POA: Insufficient documentation

## 2016-06-04 DIAGNOSIS — Z7901 Long term (current) use of anticoagulants: Secondary | ICD-10-CM | POA: Diagnosis not present

## 2016-06-04 DIAGNOSIS — Z79899 Other long term (current) drug therapy: Secondary | ICD-10-CM | POA: Diagnosis not present

## 2016-06-04 DIAGNOSIS — Z884 Allergy status to anesthetic agent status: Secondary | ICD-10-CM | POA: Diagnosis not present

## 2016-06-04 DIAGNOSIS — Z880 Allergy status to penicillin: Secondary | ICD-10-CM | POA: Insufficient documentation

## 2016-06-04 NOTE — Progress Notes (Signed)
Primary Care Physician: Gara Kroner, MD Referring Physician: Dr. Loralie Champagne is a 61 y.o. female with a h/o paroxysmal afib failing flecainide with her first ablation in 2010 at [facility] and most recently with Dr. Rayann Heman 04/2016. He reports that she has done well in the last month without any further afib. No swallowing difficulties or groin issues.  Today, she denies symptoms of palpitations, chest pain, shortness of breath, orthopnea, PND, lower extremity edema, dizziness, presyncope, syncope, or neurologic sequela. The patient is tolerating medications without difficulties and is otherwise without complaint today.   Past Medical History:  Diagnosis Date  . Allergic rhinitis   . Arthritis    "back" (05/07/2016)  . Asthma   . Atrial flutter ([place])    a. Remotely per notes.  . Colon polyps   . DVT (deep venous thrombosis) ([place]) 1980s x2 -    between birth of two sons. Saw hematology - was told she has a hypercoagulable disorder, should be on Coumadin lifelong.  . Family history of adverse reaction to anesthesia    "mother gets PONV"  . GERD (gastroesophageal reflux disease)   . Halothane adverse reaction    narrow small opening  . Heart murmur   . History of hiatal hernia    "small one/CTA 05/01/2016" (05/07/2016)  . Hypercholesteremia    hx; "brought it down w/diet" (05/07/2016)  . Hyperglycemia    a. A1c 6.1 in 2014.  Marland Kitchen Normal cardiac stress test 06/2012  . Obesity   . OSA (obstructive sleep apnea)    a. Mild, did not tolerate CPAP (05/07/2016)  . PAF (paroxysmal atrial fibrillation) ([place])    a. s/p afib ablation at [facility] 2010 (Dr. Annabell Howells). b. Recurrent AF s/p DCCV 06/2010. c. On flecainide.   Marland Kitchen PONV (postoperative nausea and vomiting)   . PPD positive, treated 1977   "treated for 1 yr after exposure to patient"  . RSV infection ~ 2006  . Wandering (atrial) pacemaker    a. Remotely per notes.   Past Surgical History:  Procedure  Laterality Date  . ATRIAL ABLATION SURGERY  05/07/2016   "fib and flutter"  . ATRIAL FIBRILLATION ABLATION  2010   at [facility]  . ATRIAL FIBRILLATION ABLATION N/A 05/07/2016   Procedure: Atrial Fibrillation Ablation;  Surgeon: Thompson Grayer, MD;  Location: [place] [facility];  Service: Cardiovascular;  Laterality: N/A;  . BREAST CYST ASPIRATION Bilateral   . CARDIAC CATHETERIZATION  2008  . CARPAL TUNNEL RELEASE Bilateral   . CESAREAN SECTION  1986; 1988  . COLONOSCOPY  X 2  . COLONOSCOPY W/ BIOPSIES AND POLYPECTOMY  X 2  . ESOPHAGOGASTRODUODENOSCOPY    . INGUINAL HERNIA REPAIR Right   . LAPAROSCOPIC CHOLECYSTECTOMY  1989  . TMJ ARTHROPLASTY    . TUBAL LIGATION  1988    Current Outpatient Prescriptions  Medication Sig Dispense Refill  . acetaminophen (TYLENOL) 650 MG CR tablet Take 1,300 mg by mouth 2 (two) times daily.    . cetirizine (ZYRTEC) 10 MG tablet Take 10 mg by mouth daily.      . Cholecalciferol (VITAMIN D3) 1000 UNITS CAPS Take 2,000 Units by mouth daily.     Marland Kitchen FLOVENT HFA 220 MCG/ACT inhaler Inhale 1 puff into the lungs 2 (two) times daily.  1  . fluticasone (FLONASE) 50 MCG/ACT nasal spray Place 1 spray into the nose 2 (two) times daily.     Marland Kitchen losartan (COZAAR) 25 MG tablet Take 1  tablet (25 mg total) by mouth daily. 90 tablet 0  . Multiple Vitamin (MULTIVITAMIN WITH MINERALS) TABS tablet Take 1 tablet by mouth daily.    Marland Kitchen omeprazole (PRILOSEC) 40 MG capsule Take 40 mg by mouth daily.     . polyethylene glycol (MIRALAX) packet Take 17 g by mouth daily as needed (for constipation.).    Marland Kitchen PROAIR HFA 108 (90 BASE) MCG/ACT inhaler Inhale 2 puffs into the lungs every 6 (six) hours as needed for wheezing or shortness of breath.     . venlafaxine XR (EFFEXOR-XR) 37.5 MG 24 hr capsule Take 37.5 mg by mouth daily with breakfast.     . verapamil (CALAN) 80 MG tablet Take 1 tablet (80 mg total) by mouth 2 (two) times daily. 60 tablet 3  . warfarin (COUMADIN) 2 MG tablet TAKE  AS DIRECTED BY COUMADIN CLINIC (Patient taking differently: TAKE 6 MG (3 TABLETS) ON SUNDAY, TUESDAY, THURSDAY & SATURDAY THEN TAKE 4 MG (2 TABLETS) ON MONDAY, WEDNESDAY, & FRIDAY) 180 tablet 1  . diltiazem (CARDIZEM) 30 MG tablet Take 1 tablet by mouth every 4 hours AS NEEDED for AFIB heart rate >100 as long as blood pressure >100. (Patient not taking: Reported on 06/04/2016) 45 tablet 3   Current Facility-Administered Medications  Medication Dose Route Frequency Provider Last Rate Last Dose  . triamcinolone acetonide (KENALOG) 10 MG/ML injection 10 mg  10 mg Other Once Harriet Masson, DPM      . triamcinolone acetonide (KENALOG) 10 MG/ML injection 10 mg  10 mg Other Once Harriet Masson, DPM        Allergies  Allergen Reactions  . Aspirin Other (See Comments)    Makes asthma worse, makes stomach hurt  . Ivp Dye [Iodinated Diagnostic Agents] Anaphylaxis    Required epinephrine. Since then, has tolerated with premed.  . Latex Other (See Comments)    Wheezing/cough   . Meperidine Hcl Other (See Comments)    Projectile vomiting   . Penicillins Anaphylaxis    Has patient had a PCN reaction causing immediate rash, facial/tongue/throat swelling, SOB or lightheadedness with hypotension:Yes Has patient had a PCN reaction causing severe rash involving mucus membranes or skin necrosis:Yes Has patient had a PCN reaction that required hospitalization:Treated in ER w/epi & breathing treatments Has patient had a PCN reaction occurring within the last 10 years:No If all of the above answers are "NO", then may proceed with Cephalosporin use.   . Statins Other (See Comments)    Severe leg pain Severe leg pain - has tried most of them except the newest one  . Sulfonamide Derivatives Anaphylaxis  . Food     Nuts-mucus membranes inflamed/cough Bananas-mucus membranes inflamed/cough    Social History   Social History  . Marital status: Married    Spouse name: N/A  . Number of children: 2  .  Years of education: N/A   Occupational History  . RN [employer]   Social History Main Topics  . Smoking status: Never Smoker  . Smokeless tobacco: Never Used  . Alcohol use Yes     Comment: 05/07/2016   . Drug use: No  . Sexual activity: Not Currently   Other Topics Concern  . Not on file   Social History Narrative  . No narrative on file    Family History  Problem Relation Age of Onset  . Stroke Mother   . Heart attack Mother     2 previous MIs, 3 stents - CAD first diagnosed  73  . Thyroid disease Mother   . Colon polyps Father   . Lupus Sister   . Heart attack Paternal Grandfather     Died at 93 of massive MI  . Stroke Maternal Grandfather     Multiple family members  . Stroke Paternal Grandmother   . Thyroid disease Sister     x 2    ROS- All systems are reviewed and negative except as per the HPI above  Physical Exam: Vitals:   06/04/16 1538  BP: 136/76  Pulse: 73  Weight: 236 lb (107 kg)  Height: 5\' 5"  (1.651 m)   Wt Readings from Last 3 Encounters:  06/04/16 236 lb (107 kg)  05/08/16 245 lb 12.8 oz (111.5 kg)  04/15/16 238 lb 3.2 oz (108 kg)    Labs: Lab Results  Component Value Date   NA 139 04/23/2016   K 3.7 04/23/2016   CL 100 04/23/2016   CO2 24 04/23/2016   GLUCOSE 137 (H) 04/23/2016   BUN 17 04/23/2016   CREATININE 0.59 04/23/2016   CALCIUM 9.7 04/23/2016   MG 2.2 01/22/2016   Lab Results  Component Value Date   INR 3.0 05/27/2016   Lab Results  Component Value Date   CHOL 238 (H) 01/13/2013   HDL 48 01/13/2013   LDLCALC 141 (H) 01/13/2013   TRIG 247 (H) 01/13/2013     GEN- The patient is well appearing, alert and oriented x 3 today.   Head- normocephalic, atraumatic Eyes-  Sclera clear, conjunctiva pink Ears- hearing intact Oropharynx- clear Neck- supple, no JVP Lymph- no cervical lymphadenopathy Lungs- Clear to ausculation bilaterally, normal work of breathing Heart- Regular rate and rhythm, no murmurs,  rubs or gallops, PMI not laterally displaced GI- soft, NT, ND, + BS Extremities- no clubbing, cyanosis, or edema MS- no significant deformity or atrophy Skin- no rash or lesion Psych- euthymic mood, full affect Neuro- strength and sensation are intact  EKG- NSR at 73 bpm, pr int 174 ms, qrs int 82 ms, qtc 464 ms Epic records reviewed    Assessment and Plan: 1. Paroxysmal afib Doing well staying in SR s/p ablation Off multaq Continue verapamil 80 mg bid Continue warfarin, next INR 4/4  F/u with Dr. Rayann Heman as scheduled 5/21  Geroge Baseman. Damaso Laday, [facility] [address] [place], [place] [address] [phone]

## 2016-06-24 ENCOUNTER — Ambulatory Visit (INDEPENDENT_AMBULATORY_CARE_PROVIDER_SITE_OTHER): Payer: 59 | Admitting: *Deleted

## 2016-06-24 DIAGNOSIS — I4891 Unspecified atrial fibrillation: Secondary | ICD-10-CM

## 2016-06-24 DIAGNOSIS — I480 Paroxysmal atrial fibrillation: Secondary | ICD-10-CM

## 2016-06-24 DIAGNOSIS — Z5181 Encounter for therapeutic drug level monitoring: Secondary | ICD-10-CM

## 2016-06-24 LAB — POCT INR: INR: 2.2

## 2016-07-24 DIAGNOSIS — E119 Type 2 diabetes mellitus without complications: Secondary | ICD-10-CM | POA: Diagnosis not present

## 2016-07-24 DIAGNOSIS — N951 Menopausal and female climacteric states: Secondary | ICD-10-CM | POA: Diagnosis not present

## 2016-07-24 DIAGNOSIS — M5432 Sciatica, left side: Secondary | ICD-10-CM | POA: Diagnosis not present

## 2016-07-24 DIAGNOSIS — I4891 Unspecified atrial fibrillation: Secondary | ICD-10-CM | POA: Diagnosis not present

## 2016-07-24 DIAGNOSIS — I10 Essential (primary) hypertension: Secondary | ICD-10-CM | POA: Diagnosis not present

## 2016-07-24 DIAGNOSIS — J309 Allergic rhinitis, unspecified: Secondary | ICD-10-CM | POA: Diagnosis not present

## 2016-07-24 DIAGNOSIS — K219 Gastro-esophageal reflux disease without esophagitis: Secondary | ICD-10-CM | POA: Diagnosis not present

## 2016-07-24 DIAGNOSIS — E782 Mixed hyperlipidemia: Secondary | ICD-10-CM | POA: Diagnosis not present

## 2016-07-24 DIAGNOSIS — J45909 Unspecified asthma, uncomplicated: Secondary | ICD-10-CM | POA: Diagnosis not present

## 2016-07-27 ENCOUNTER — Telehealth: Payer: Self-pay | Admitting: Internal Medicine

## 2016-07-27 NOTE — Telephone Encounter (Signed)
Pt called to report that she will be having a dental procedure coming up and needs to know what to do about her coumadin. Will call office doing procedure to see what procedure and if clearance for anticoag is needed. Coralyn Mark with [employer] [phone] reports that pt has not been seen yet, but as of now should be routine root canal and pt should not need to hold coumadin for procedure. They will call if this changes.   Pt made aware of above. She states appreciation.

## 2016-07-27 NOTE — Telephone Encounter (Signed)
New message  Pt call requesting to speak with coumadin. Pt states she will wait for a call back to discuss

## 2016-07-28 ENCOUNTER — Other Ambulatory Visit: Payer: Self-pay | Admitting: Family Medicine

## 2016-07-28 DIAGNOSIS — M5432 Sciatica, left side: Secondary | ICD-10-CM

## 2016-08-01 ENCOUNTER — Ambulatory Visit
Admission: RE | Admit: 2016-08-01 | Discharge: 2016-08-01 | Disposition: A | Discharge status: Home / Self Care | Payer: 59 | Source: Ambulatory Visit | Attending: Family Medicine | Admitting: Family Medicine

## 2016-08-01 DIAGNOSIS — M5432 Sciatica, left side: Secondary | ICD-10-CM

## 2016-08-01 DIAGNOSIS — M5126 Other intervertebral disc displacement, lumbar region: Secondary | ICD-10-CM | POA: Diagnosis not present

## 2016-08-05 ENCOUNTER — Ambulatory Visit (INDEPENDENT_AMBULATORY_CARE_PROVIDER_SITE_OTHER): Payer: 59 | Admitting: *Deleted

## 2016-08-05 DIAGNOSIS — I4891 Unspecified atrial fibrillation: Secondary | ICD-10-CM | POA: Diagnosis not present

## 2016-08-05 DIAGNOSIS — Z5181 Encounter for therapeutic drug level monitoring: Secondary | ICD-10-CM | POA: Diagnosis not present

## 2016-08-05 DIAGNOSIS — I480 Paroxysmal atrial fibrillation: Secondary | ICD-10-CM | POA: Diagnosis not present

## 2016-08-05 LAB — POCT INR: INR: 2.9

## 2016-08-10 ENCOUNTER — Ambulatory Visit (INDEPENDENT_AMBULATORY_CARE_PROVIDER_SITE_OTHER): Payer: 59 | Admitting: Internal Medicine

## 2016-08-10 ENCOUNTER — Encounter: Payer: Self-pay | Admitting: Internal Medicine

## 2016-08-10 VITALS — BP 132/84 | HR 80 | Ht 65.0 in | Wt 240.0 lb

## 2016-08-10 DIAGNOSIS — I10 Essential (primary) hypertension: Secondary | ICD-10-CM

## 2016-08-10 DIAGNOSIS — I480 Paroxysmal atrial fibrillation: Secondary | ICD-10-CM

## 2016-08-10 NOTE — Progress Notes (Signed)
PCP: Antony Contras, MD Primary Cardiologist:  [name] is a 61 y.o. female who presents today for routine electrophysiology followup.  Since her recent afib ablation, the patient reports doing very well.  She has had no symptoms of arrhythmia.  She has had no complications related to her ablation and is pleased with current health state.  Today, she denies symptoms of palpitations, chest pain, shortness of breath,  lower extremity edema, dizziness, presyncope, or syncope.  The patient is otherwise without complaint today.   Past Medical History:  Diagnosis Date  . Allergic rhinitis   . Arthritis    "back" (05/07/2016)  . Asthma   . Atrial flutter ([place])    a. Remotely per notes.  . Colon polyps   . DVT (deep venous thrombosis) ([place]) 1980s x2 -    between birth of two sons. Saw hematology - was told she has a hypercoagulable disorder, should be on Coumadin lifelong.  . Family history of adverse reaction to anesthesia    "mother gets PONV"  . GERD (gastroesophageal reflux disease)   . Halothane adverse reaction    narrow small opening  . Heart murmur   . History of hiatal hernia    "small one/CTA 05/01/2016" (05/07/2016)  . Hypercholesteremia    hx; "brought it down w/diet" (05/07/2016)  . Hyperglycemia    a. A1c 6.1 in 2014.  Marland Kitchen Normal cardiac stress test 06/2012  . Obesity   . OSA (obstructive sleep apnea)    a. Mild, did not tolerate CPAP (05/07/2016)  . PAF (paroxysmal atrial fibrillation) ([place])    a. s/p afib ablation at [facility] 2010 (Kelly. Annabell Howells). b. Recurrent AF s/p DCCV 06/2010. c. On flecainide.   Marland Kitchen PONV (postoperative nausea and vomiting)   . PPD positive, treated 1977   "treated for 1 yr after exposure to patient"  . RSV infection ~ 2006  . Wandering (atrial) pacemaker    a. Remotely per notes.   Past Surgical History:  Procedure Laterality Date  . ATRIAL ABLATION SURGERY  05/07/2016   "fib and flutter"  . ATRIAL FIBRILLATION ABLATION  2010   at  [facility]  . ATRIAL FIBRILLATION ABLATION N/A 05/07/2016   Procedure: Atrial Fibrillation Ablation;  Surgeon: Thompson Grayer, MD;  Location: [facility];  Service: Cardiovascular;  Laterality: N/A;  . BREAST CYST ASPIRATION Bilateral   . CARDIAC CATHETERIZATION  2008  . CARPAL TUNNEL RELEASE Bilateral   . CESAREAN SECTION  1986; 1988  . COLONOSCOPY  X 2  . COLONOSCOPY W/ BIOPSIES AND POLYPECTOMY  X 2  . ESOPHAGOGASTRODUODENOSCOPY    . INGUINAL HERNIA REPAIR Right   . LAPAROSCOPIC CHOLECYSTECTOMY  1989  . TMJ ARTHROPLASTY    . TUBAL LIGATION  1988    ROS- all systems are reviewed and negatives except as per HPI above  Current Outpatient Prescriptions  Medication Sig Dispense Refill  . acetaminophen (TYLENOL) 650 MG CR tablet Take 1,300 mg by mouth 2 (two) times daily.    . cetirizine (ZYRTEC) 10 MG tablet Take 10 mg by mouth daily.      . Cholecalciferol (VITAMIN D3) 1000 UNITS CAPS Take 2,000 Units by mouth daily.     Marland Kitchen diltiazem (CARDIZEM) 30 MG tablet Take 1 tablet by mouth every 4 hours AS NEEDED for AFIB heart rate >100 as long as blood pressure >100. 45 tablet 3  . FLOVENT HFA 220 MCG/ACT inhaler Inhale 1 puff into the lungs 2 (two) times daily.  1  . fluticasone (FLONASE) 50 MCG/ACT nasal spray Place 1 spray into the nose 2 (two) times daily.     Marland Kitchen losartan (COZAAR) 25 MG tablet Take 1 tablet (25 mg total) by mouth daily. 90 tablet 0  . Multiple Vitamin (MULTIVITAMIN WITH MINERALS) TABS tablet Take 1 tablet by mouth daily.    Marland Kitchen omeprazole (PRILOSEC) 40 MG capsule Take 40 mg by mouth daily.     . polyethylene glycol (MIRALAX) packet Take 17 g by mouth daily as needed (for constipation.).    Marland Kitchen PROAIR HFA 108 (90 BASE) MCG/ACT inhaler Inhale 2 puffs into the lungs every 6 (six) hours as needed for wheezing or shortness of breath.     . venlafaxine XR (EFFEXOR-XR) 37.5 MG 24 hr capsule Take 37.5 mg by mouth daily with breakfast.     . verapamil (CALAN) 80 MG tablet Take 160  mg by mouth 2 (two) times daily.    Marland Kitchen warfarin (COUMADIN) 2 MG tablet TAKE AS DIRECTED BY COUMADIN CLINIC (Patient taking differently: TAKE 6 MG (3 TABLETS) ON SUNDAY, TUESDAY, THURSDAY & SATURDAY THEN TAKE 4 MG (2 TABLETS) ON MONDAY, WEDNESDAY, & FRIDAY) 180 tablet 1   Current Facility-Administered Medications  Medication Dose Route Frequency Provider Last Rate Last Dose  . triamcinolone acetonide (KENALOG) 10 MG/ML injection 10 mg  10 mg Other Once [name], DPM      . triamcinolone acetonide (KENALOG) 10 MG/ML injection 10 mg  10 mg Other Once [name], DPM        Physical Exam: Vitals:   08/10/16 1213  BP: 132/84  Pulse: 80  SpO2: 95%  Weight: 240 lb (108.9 kg)  Height: 5\' 5"  (1.651 m)    GEN- The patient is well appearing, alert and oriented x 3 today.   Head- normocephalic, atraumatic Eyes-  Sclera clear, conjunctiva pink Ears- hearing intact Oropharynx- clear Lungs- Clear to ausculation bilaterally, normal work of breathing Heart- Regular rate and rhythm, no murmurs, rubs or gallops, PMI not laterally displaced GI- soft, NT, ND, + BS Extremities- no clubbing, cyanosis, or edema  ekg today reveals sinus rhythm  Assessment and Plan:  1. Afib/ PACs Well controlled post ablation off AAD therapy She is pleased with current health state Prefers to continue verapamil 160mg  BID  2. HTN Stable No change required today  3. Sleep apnea Mild She has previously seen Kelly Maxwell Caul who recommends conservative measures   No changes today Return to see me in 3 months  Thompson Grayer MD, [facility] 08/10/2016 3:15 PM

## 2016-08-10 NOTE — Patient Instructions (Signed)

## 2016-08-18 DIAGNOSIS — S335XXA Sprain of ligaments of lumbar spine, initial encounter: Secondary | ICD-10-CM | POA: Diagnosis not present

## 2016-09-16 ENCOUNTER — Ambulatory Visit (INDEPENDENT_AMBULATORY_CARE_PROVIDER_SITE_OTHER): Payer: 59 | Admitting: *Deleted

## 2016-09-16 DIAGNOSIS — I4891 Unspecified atrial fibrillation: Secondary | ICD-10-CM

## 2016-09-16 DIAGNOSIS — I480 Paroxysmal atrial fibrillation: Secondary | ICD-10-CM | POA: Diagnosis not present

## 2016-09-16 DIAGNOSIS — Z5181 Encounter for therapeutic drug level monitoring: Secondary | ICD-10-CM

## 2016-09-16 LAB — POCT INR: INR: 3.1

## 2016-10-04 ENCOUNTER — Emergency Department (HOSPITAL_COMMUNITY): Payer: 59

## 2016-10-04 ENCOUNTER — Encounter (HOSPITAL_COMMUNITY): Payer: Self-pay | Admitting: Emergency Medicine

## 2016-10-04 ENCOUNTER — Emergency Department (HOSPITAL_COMMUNITY)
Admission: EM | Admit: 2016-10-04 | Discharge: 2016-10-04 | Disposition: A | Discharge status: Home / Self Care | Payer: 59 | Attending: Emergency Medicine | Admitting: Emergency Medicine

## 2016-10-04 DIAGNOSIS — J45909 Unspecified asthma, uncomplicated: Secondary | ICD-10-CM | POA: Diagnosis not present

## 2016-10-04 DIAGNOSIS — Z7901 Long term (current) use of anticoagulants: Secondary | ICD-10-CM | POA: Diagnosis not present

## 2016-10-04 DIAGNOSIS — K5792 Diverticulitis of intestine, part unspecified, without perforation or abscess without bleeding: Secondary | ICD-10-CM | POA: Insufficient documentation

## 2016-10-04 DIAGNOSIS — R197 Diarrhea, unspecified: Secondary | ICD-10-CM | POA: Diagnosis not present

## 2016-10-04 DIAGNOSIS — R1084 Generalized abdominal pain: Secondary | ICD-10-CM | POA: Diagnosis not present

## 2016-10-04 DIAGNOSIS — Z79899 Other long term (current) drug therapy: Secondary | ICD-10-CM | POA: Diagnosis not present

## 2016-10-04 DIAGNOSIS — Z91040 Latex allergy status: Secondary | ICD-10-CM | POA: Insufficient documentation

## 2016-10-04 DIAGNOSIS — K5730 Diverticulosis of large intestine without perforation or abscess without bleeding: Secondary | ICD-10-CM | POA: Diagnosis not present

## 2016-10-04 LAB — COMPREHENSIVE METABOLIC PANEL
ALT: 42 U/L (ref 14–54)
AST: 42 U/L — AB (ref 15–41)
Albumin: 3.6 g/dL (ref 3.5–5.0)
Alkaline Phosphatase: 83 U/L (ref 38–126)
Anion gap: 11 (ref 5–15)
BILIRUBIN TOTAL: 0.5 mg/dL (ref 0.3–1.2)
BUN: 10 mg/dL (ref 6–20)
CHLORIDE: 104 mmol/L (ref 101–111)
CO2: 23 mmol/L (ref 22–32)
CREATININE: 0.71 mg/dL (ref 0.44–1.00)
Calcium: 9.5 mg/dL (ref 8.9–10.3)
GFR calc Af Amer: >60 mL/min (ref >60)
Glucose, Bld: 163 mg/dL — ABNORMAL HIGH (ref 65–99)
Potassium: 4 mmol/L (ref 3.5–5.1)
Sodium: 138 mmol/L (ref 135–145)
TOTAL PROTEIN: 7.1 g/dL (ref 6.5–8.1)

## 2016-10-04 LAB — CBC
HCT: 40.5 % (ref 36.0–46.0)
Hemoglobin: 13.7 g/dL (ref 12.0–15.0)
MCH: 30.1 pg (ref 26.0–34.0)
MCHC: 33.8 g/dL (ref 30.0–36.0)
MCV: 89 fL (ref 78.0–100.0)
PLATELETS: 383 10*3/uL (ref 150–400)
RBC: 4.55 MIL/uL (ref 3.87–5.11)
RDW: 13.5 % (ref 11.5–15.5)
WBC: 6.7 10*3/uL (ref 4.0–10.5)

## 2016-10-04 LAB — URINALYSIS, ROUTINE W REFLEX MICROSCOPIC
Bilirubin Urine: NEGATIVE
GLUCOSE, UA: NEGATIVE mg/dL
Hgb urine dipstick: NEGATIVE
KETONES UR: 15 mg/dL — AB
LEUKOCYTES UA: NEGATIVE
Nitrite: NEGATIVE
PROTEIN: NEGATIVE mg/dL
Specific Gravity, Urine: >1.03 — ABNORMAL HIGH (ref 1.005–1.030)
pH: 5.5 (ref 5.0–8.0)

## 2016-10-04 LAB — PROTIME-INR
INR: 2.09
Prothrombin Time: 23.8 seconds — ABNORMAL HIGH (ref 11.4–15.2)

## 2016-10-04 LAB — LIPASE, BLOOD: Lipase: 22 U/L (ref 11–51)

## 2016-10-04 MED ORDER — ONDANSETRON HCL 4 MG PO TABS: 4.0000 mg | ORAL_TABLET | Freq: Three times a day (TID) | ORAL | 0 refills | Status: DC | PRN

## 2016-10-04 MED ORDER — CIPROFLOXACIN IN D5W 400 MG/200ML IV SOLN
400.0000 mg | Freq: Once | INTRAVENOUS | Status: AC
  Administered 2016-10-04: 400 mg via INTRAVENOUS
  Filled 2016-10-04: qty 200

## 2016-10-04 MED ORDER — METRONIDAZOLE 500 MG PO TABS: 500.0000 mg | ORAL_TABLET | Freq: Three times a day (TID) | ORAL | 0 refills | Status: AC

## 2016-10-04 MED ORDER — CIPROFLOXACIN HCL 500 MG PO TABS: 500.0000 mg | ORAL_TABLET | Freq: Two times a day (BID) | ORAL | 0 refills | Status: AC

## 2016-10-04 MED ORDER — MORPHINE SULFATE (PF) 4 MG/ML IV SOLN
4.0000 mg | Freq: Once | INTRAVENOUS | Status: AC
  Administered 2016-10-04: 4 mg via INTRAVENOUS
  Filled 2016-10-04: qty 1

## 2016-10-04 MED ORDER — OXYCODONE-ACETAMINOPHEN 5-325 MG PO TABS
1.0000 | ORAL_TABLET | ORAL | 0 refills | Status: DC | PRN
Start: 2016-10-04 — End: 2016-11-11

## 2016-10-04 MED ORDER — METRONIDAZOLE IN NACL 5-0.79 MG/ML-% IV SOLN
500.0000 mg | Freq: Once | INTRAVENOUS | Status: AC
  Administered 2016-10-04: 500 mg via INTRAVENOUS
  Filled 2016-10-04: qty 100

## 2016-10-04 MED ORDER — SODIUM CHLORIDE 0.9 % IV BOLUS (SEPSIS)
500.0000 mL | Freq: Once | INTRAVENOUS | Status: AC
  Administered 2016-10-04: 500 mL via INTRAVENOUS

## 2016-10-04 NOTE — ED Provider Notes (Signed)
[facility] DEPT Provider Note   CSN: [id number] Arrival date & time: 10/04/16  1114     History   Chief Complaint Chief Complaint  Patient presents with  . Abdominal Pain  . Diarrhea    HPI [name] is a 61 y.o. female.      The history is provided by the patient, the spouse and medical records.  Abdominal Pain   This is a new problem. The current episode started more than 2 days ago. The problem occurs constantly. The problem has not changed since onset.The pain is located in the RLQ and suprapubic region. The quality of the pain is dull, sharp and aching. The pain is at a severity of 7/10. The pain is moderate. Associated symptoms include anorexia. Pertinent negatives include fever, diarrhea, nausea, vomiting, constipation, dysuria and headaches. The symptoms are aggravated by palpation. Nothing relieves the symptoms. Past workup includes surgery. Her past medical history is significant for gallstones and GERD.    Past Medical History:  Diagnosis Date  . Allergic rhinitis   . Arthritis    "back" (05/07/2016)  . Asthma   . Atrial flutter ([place])    a. Remotely per notes.  . Colon polyps   . DVT (deep venous thrombosis) ([place]) 1980s x2 -    between birth of two sons. Saw hematology - was told she has a hypercoagulable disorder, should be on Coumadin lifelong.  . Family history of adverse reaction to anesthesia    "mother gets PONV"  . GERD (gastroesophageal reflux disease)   . Halothane adverse reaction    narrow small opening  . Heart murmur   . History of hiatal hernia    "small one/CTA 05/01/2016" (05/07/2016)  . Hypercholesteremia    hx; "brought it down w/diet" (05/07/2016)  . Hyperglycemia    a. A1c 6.1 in 2014.  Marland Kitchen Normal cardiac stress test 06/2012  . Obesity   . OSA (obstructive sleep apnea)    a. Mild, did not tolerate CPAP (05/07/2016)  . PAF (paroxysmal atrial fibrillation) ([place])    a. s/p afib ablation at [facility] 2010 (Dr. Annabell Howells). b. Recurrent  AF s/p DCCV 06/2010. c. On flecainide.   Marland Kitchen PONV (postoperative nausea and vomiting)   . PPD positive, treated 1977   "treated for 1 yr after exposure to patient"  . RSV infection ~ 2006  . Wandering (atrial) pacemaker    a. Remotely per notes.    Patient Active Problem List   Diagnosis Date Noted  . A-fib ([place]) 05/07/2016  . Obesity 01/10/2014  . Sleep apnea 01/10/2014  . Encounter for therapeutic drug monitoring 04/26/2013  . Essential hypertension 03/01/2013  . History of DVT (deep vein thrombosis) 01/12/2013  . Hyperglycemia 01/12/2013  . Chest pain 06/15/2012  . Shortness of breath 06/15/2012  . Dizziness 07/09/2010  . HYPERCHOLESTEROLEMIA 09/10/2009  . CARPAL TUNNEL SYNDROME 09/10/2009  . Asthma 09/10/2009  . TMJ SYNDROME 09/10/2009  . GERD 09/10/2009  . GALLBLADDER DISEASE 09/10/2009  . Paroxysmal atrial fibrillation ([place]) 09/09/2009    Past Surgical History:  Procedure Laterality Date  . ATRIAL ABLATION SURGERY  05/07/2016   "fib and flutter"  . ATRIAL FIBRILLATION ABLATION  2010   at [facility]  . ATRIAL FIBRILLATION ABLATION N/A 05/07/2016   Procedure: Atrial Fibrillation Ablation;  Surgeon: Thompson Grayer, MD;  Location: [facility];  Service: Cardiovascular;  Laterality: N/A;  . BREAST CYST ASPIRATION Bilateral   . CARDIAC CATHETERIZATION  2008  . CARPAL TUNNEL RELEASE  Bilateral   . CESAREAN SECTION  1986; 1988  . COLONOSCOPY  X 2  . COLONOSCOPY W/ BIOPSIES AND POLYPECTOMY  X 2  . ESOPHAGOGASTRODUODENOSCOPY    . INGUINAL HERNIA REPAIR Right   . LAPAROSCOPIC CHOLECYSTECTOMY  1989  . TMJ ARTHROPLASTY    . TUBAL LIGATION  1988    OB History    No data available       Home Medications    Prior to Admission medications   Medication Sig Start Date End Date Taking? Authorizing Provider  acetaminophen (TYLENOL) 650 MG CR tablet Take 1,300 mg by mouth 2 (two) times daily.    [provider]  cetirizine (ZYRTEC) 10 MG tablet Take 10 mg by  mouth daily.      [provider]  Cholecalciferol (VITAMIN D3) 1000 UNITS CAPS Take 2,000 Units by mouth daily.     [provider]  diltiazem (CARDIZEM) 30 MG tablet Take 1 tablet by mouth every 4 hours AS NEEDED for AFIB heart rate >100 as long as blood pressure >100. 03/18/16   Sherran Needs, NP  FLOVENT HFA 220 MCG/ACT inhaler Inhale 1 puff into the lungs 2 (two) times daily. 04/20/16   [provider]  fluticasone (FLONASE) 50 MCG/ACT nasal spray Place 1 spray into the nose 2 (two) times daily.  06/02/12   [provider]  losartan (COZAAR) 25 MG tablet Take 1 tablet (25 mg total) by mouth daily. 12/20/13   Allred, Jeneen Rinks, MD  Multiple Vitamin (MULTIVITAMIN WITH MINERALS) TABS tablet Take 1 tablet by mouth daily.    [provider]  omeprazole (PRILOSEC) 40 MG capsule Take 40 mg by mouth daily.  04/12/10   [provider]  polyethylene glycol (MIRALAX) packet Take 17 g by mouth daily as needed (for constipation.).    [provider]  PROAIR HFA 108 (90 BASE) MCG/ACT inhaler Inhale 2 puffs into the lungs every 6 (six) hours as needed for wheezing or shortness of breath.  03/22/12   [provider]  venlafaxine XR (EFFEXOR-XR) 37.5 MG 24 hr capsule Take 37.5 mg by mouth daily with breakfast.  11/21/12   [provider]  verapamil (CALAN) 80 MG tablet Take 160 mg by mouth 2 (two) times daily.    [provider]  warfarin (COUMADIN) 2 MG tablet TAKE AS DIRECTED BY COUMADIN CLINIC Patient taking differently: TAKE 6 MG (3 TABLETS) ON SUNDAY, TUESDAY, THURSDAY & SATURDAY THEN TAKE 4 MG (2 TABLETS) ON MONDAY, [place], & FRIDAY 05/04/16   Thompson Grayer, MD    Family History Family History  Problem Relation Age of Onset  . Stroke Mother   . Heart attack Mother        2 previous MIs, 3 stents - CAD first diagnosed 63  . Thyroid disease Mother   . Colon polyps Father   . Lupus Sister   . Heart attack Paternal  Grandfather        Died at 109 of massive MI  . Stroke Maternal Grandfather        Multiple family members  . Stroke Paternal Grandmother   . Thyroid disease Sister        x 2    Social History Social History  Substance Use Topics  . Smoking status: Never Smoker  . Smokeless tobacco: Never Used  . Alcohol use Yes     Comment: 05/07/2016      Allergies   Aspirin; Ivp dye [iodinated diagnostic agents]; Latex; Meperidine hcl;  Penicillins; Statins; Sulfonamide derivatives; and Food   Review of Systems Review of Systems  Constitutional: Positive for appetite change. Negative for chills, diaphoresis, fatigue and fever.  HENT: Negative for congestion and rhinorrhea.   Respiratory: Negative for chest tightness, shortness of breath, wheezing and stridor.   Cardiovascular: Negative for chest pain.  Gastrointestinal: Positive for abdominal pain and anorexia. Negative for constipation, diarrhea, nausea and vomiting.  Genitourinary: Negative for dysuria.  Musculoskeletal: Negative for back pain, neck pain and neck stiffness.  Skin: Negative for rash and wound.  Neurological: Negative for light-headedness and headaches.  Psychiatric/Behavioral: Negative for agitation.  All other systems reviewed and are negative.    Physical Exam Updated Vital Signs BP (!) 145/65 (BP Location: Left Arm)   Pulse 90   Temp 98.5 F (36.9 C) (Oral)   Resp 20   Wt 106.1 kg (234 lb)   SpO2 96%   BMI 38.94 kg/m   Physical Exam  Constitutional: She is oriented to person, place, and time. She appears well-developed and well-nourished. No distress.  HENT:  Head: Normocephalic and atraumatic.  Right Ear: External ear normal.  Left Ear: External ear normal.  Nose: Nose normal.  Mouth/Throat: Oropharynx is clear and moist. No oropharyngeal exudate.  Eyes: Pupils are equal, round, and reactive to light. Conjunctivae and EOM are normal.  Neck: Normal range of motion. Neck supple.  Cardiovascular: Normal  rate and intact distal pulses.   No murmur heard. Pulmonary/Chest: Effort normal. No stridor. No respiratory distress. She has no wheezes. She exhibits no tenderness.  Abdominal: Normal appearance. She exhibits no distension. There is tenderness in the right lower quadrant and suprapubic area. There is no rigidity, no rebound and no CVA tenderness.    Musculoskeletal: She exhibits no tenderness.  Neurological: She is alert and oriented to person, place, and time. She has normal reflexes. No cranial nerve deficit or sensory deficit. She exhibits normal muscle tone. Coordination normal.  Skin: Skin is warm. Capillary refill takes less than 2 seconds. No rash noted. She is not diaphoretic. No erythema.  Psychiatric: She has a normal mood and affect.  Nursing note and vitals reviewed.    ED Treatments / Results  Labs (all labs ordered are listed, but only abnormal results are displayed) Labs Reviewed  COMPREHENSIVE METABOLIC PANEL - Abnormal; Notable for the following:       Result Value   Glucose, Bld 163 (*)    AST 42 (*)    All other components within normal limits  URINALYSIS, ROUTINE W REFLEX MICROSCOPIC - Abnormal; Notable for the following:    APPearance HAZY (*)    Specific Gravity, Urine >1.030 (*)    Ketones, ur 15 (*)    All other components within normal limits  PROTIME-INR - Abnormal; Notable for the following:    Prothrombin Time 23.8 (*)    All other components within normal limits  LIPASE, BLOOD  CBC    EKG  EKG Interpretation None       Radiology Ct Abdomen Pelvis Wo Contrast  Result Date: 10/04/2016 CLINICAL DATA:  Periumbilical and RIGHT lower quadrant abdominal pain and diarrhea since last Sunday, anorexia, history as, cholecystectomy, inguinal hernia repairs, contrast allergy EXAM: CT ABDOMEN AND PELVIS WITHOUT CONTRAST TECHNIQUE: Multidetector CT imaging of the abdomen and pelvis was performed following the standard protocol without IV contrast. Sagittal  and coronal MPR images reconstructed from axial data set. No oral contrast was administered. COMPARISON:  None FINDINGS: Lower chest: Lung bases clear Hepatobiliary:  Gallbladder surgically absent. Minimal fatty infiltration of liver. No biliary dilatation or focal hepatic abnormality Pancreas: Normal appearance Spleen: Normal appearance Adrenals/Urinary Tract: Adrenal glands normal appearance. Tiny nonobstructing renal calculi bilaterally. No renal mass, hydronephrosis or hydroureter. Bladder unremarkable Stomach/Bowel: Normal appendix. Sigmoid diverticulosis with focal wall thickening of the sigmoid colon and pericolic infiltrative changes in the sigmoid mesocolon consistent with mild acute diverticulitis. No evidence of perforation or abscess. Remaining bowel loops unremarkable. Stomach incompletely distended, unable to accurately assess gastric wall thickness. Question tiny hiatal hernia versus epiphrenic esophageal diverticulum. Vascular/Lymphatic: Minimal atherosclerotic calcification aorta. Aorta normal caliber. No adenopathy. Reproductive: Unremarkable uterus and adnexa Other: No free air or free fluid. No hernia. Prior RIGHT inguinal herniorrhaphy. Musculoskeletal: Unremarkable IMPRESSION: Mild sigmoid diverticulitis. Question tiny hiatal hernia versus epiphrenic diverticulum of the distal esophagus. Aortic Atherosclerosis (ICD10-I70.0). Electronically Signed   By: Lavonia Dana M.D.   On: 10/04/2016 14:57    Procedures Procedures (including critical care time)  Medications Ordered in ED Medications  ciprofloxacin (CIPRO) IVPB 400 mg (400 mg Intravenous New Bag/Given 10/04/16 1552)    And  metroNIDAZOLE (FLAGYL) IVPB 500 mg (500 mg Intravenous New Bag/Given 10/04/16 1554)  morphine 4 MG/ML injection 4 mg (4 mg Intravenous Given 10/04/16 1340)  sodium chloride 0.9 % bolus 500 mL (0 mLs Intravenous Stopped 10/04/16 1502)     Initial Impression / Assessment and Plan / ED Course  I have reviewed the  triage vital signs and the nursing notes.  Pertinent labs & imaging results that were available during my care of the patient were reviewed by me and considered in my medical decision making (see chart for details).     [name] is a 61 y.o. female with a past medical history significant for DVT and atrial fibrillation on Coumadin therapy, GERD, and prior cholecystectomy who presents with 1 week of abdominal pain. Patient says she is having pain in her right lower quadrant and left lower quadrant. She describes the pain as 7 out of 10 severity and severe. It is sharp and cramping. She reports decreased appetite. She denies nausea or vomiting. She denies rectal bleeding but does report decreased caliber of stools. She denies dysuria or change in urination. She denies fevers or chills. She denies any upper respiratory symptoms.   On exam, patient has tenderness in the right lower quadrant. No CVA tenderness. Lungs clear. No other significant abnormalities on exam.  Patient had laboratory testing showing no evidence of UTI. Lipase not elevated. INR is therapeutic with her Coumadin. CMP showed normal creatinine and similar LFT to prior. No leukocytosis or anemia. Next  CT scan was without contrast due to anaphylaxis and reveal patient has diverticulitis.  Patient given IV antibiotics in the ED with Cipro and Flagyl.  Due to patient's Coumadin use, pharmacy was consult for outpatient anabolic regimen given her reassuring exam and vital signs. Patient will be discharged home with antibiotics, pain medicine, nausea medicine.          Final Clinical Impressions(s) / ED Diagnoses   Final diagnoses:  Diverticulitis  Generalized abdominal pain    New Prescriptions New Prescriptions   CIPROFLOXACIN (CIPRO) 500 MG TABLET    Take 1 tablet (500 mg total) by mouth every 12 (twelve) hours.   METRONIDAZOLE (FLAGYL) 500 MG TABLET    Take 1 tablet (500 mg total) by mouth 3 (three) times daily.    ONDANSETRON (ZOFRAN) 4 MG TABLET    Take 1 tablet (4 mg total) by mouth  every 8 (eight) hours as needed for nausea or vomiting.   OXYCODONE-ACETAMINOPHEN (PERCOCET/ROXICET) 5-325 MG TABLET    Take 1 tablet by mouth every 4 (four) hours as needed for severe pain.    Clinical Impression: 1. Diverticulitis   2. Generalized abdominal pain     Disposition: Discharge  Condition: Good  I have discussed the results, Dx and Tx plan with the pt(& family if present). He/she/they expressed understanding and agree(s) with the plan. Discharge instructions discussed at great length. Strict return precautions discussed and pt &/or family have verbalized understanding of the instructions. No further questions at time of discharge.    New Prescriptions   CIPROFLOXACIN (CIPRO) 500 MG TABLET    Take 1 tablet (500 mg total) by mouth every 12 (twelve) hours.   METRONIDAZOLE (FLAGYL) 500 MG TABLET    Take 1 tablet (500 mg total) by mouth 3 (three) times daily.   ONDANSETRON (ZOFRAN) 4 MG TABLET    Take 1 tablet (4 mg total) by mouth every 8 (eight) hours as needed for nausea or vomiting.   OXYCODONE-ACETAMINOPHEN (PERCOCET/ROXICET) 5-325 MG TABLET    Take 1 tablet by mouth every 4 (four) hours as needed for severe pain.    Follow Up: No follow-up provider specified.    Izaha Shughart, Gwenyth Allegra, MD 10/05/16 1041

## 2016-10-04 NOTE — ED Triage Notes (Signed)
abd pain, for * dfays  was  Constipated  And took some lax and she  Stools Friday nothing yesterday  And today has gone 3 x today and it has been green gray, pt is on coumadin states no blood in stool

## 2016-10-04 NOTE — Discharge Instructions (Signed)
Please take your antibiotics to treat your diverticulitis. Please stay hydrated. Please use the pain medicine and nausea medicine to help manage her symptoms.   Pharmacy recommends changing your Coumadin dose to just 4 milligrams per day instead of alternating with the 6mg . Please schedule a follow-up with your primary care physician within the week for repeat INR check in the setting of the antibiotics which can alter it.  Please follow-up with your PCP for INR recheck within the next week. If any symptoms change, worsen or you are unable to tolerate food or fluids, please return to the nearest margin.

## 2016-10-07 ENCOUNTER — Ambulatory Visit (INDEPENDENT_AMBULATORY_CARE_PROVIDER_SITE_OTHER): Payer: 59 | Admitting: *Deleted

## 2016-10-07 ENCOUNTER — Telehealth: Payer: Self-pay | Admitting: *Deleted

## 2016-10-07 ENCOUNTER — Encounter (HOSPITAL_COMMUNITY): Payer: Self-pay | Admitting: *Deleted

## 2016-10-07 ENCOUNTER — Emergency Department (HOSPITAL_COMMUNITY)
Admission: EM | Admit: 2016-10-07 | Discharge: 2016-10-07 | Disposition: A | Discharge status: Home / Self Care | Payer: 59 | Attending: Emergency Medicine | Admitting: Emergency Medicine

## 2016-10-07 DIAGNOSIS — I480 Paroxysmal atrial fibrillation: Secondary | ICD-10-CM | POA: Diagnosis not present

## 2016-10-07 DIAGNOSIS — I10 Essential (primary) hypertension: Secondary | ICD-10-CM | POA: Insufficient documentation

## 2016-10-07 DIAGNOSIS — R55 Syncope and collapse: Secondary | ICD-10-CM | POA: Diagnosis not present

## 2016-10-07 DIAGNOSIS — Z91040 Latex allergy status: Secondary | ICD-10-CM | POA: Insufficient documentation

## 2016-10-07 DIAGNOSIS — Z5181 Encounter for therapeutic drug level monitoring: Secondary | ICD-10-CM | POA: Diagnosis not present

## 2016-10-07 DIAGNOSIS — R110 Nausea: Secondary | ICD-10-CM | POA: Diagnosis not present

## 2016-10-07 DIAGNOSIS — I4891 Unspecified atrial fibrillation: Secondary | ICD-10-CM

## 2016-10-07 DIAGNOSIS — J45909 Unspecified asthma, uncomplicated: Secondary | ICD-10-CM | POA: Diagnosis not present

## 2016-10-07 DIAGNOSIS — Z79899 Other long term (current) drug therapy: Secondary | ICD-10-CM | POA: Diagnosis not present

## 2016-10-07 DIAGNOSIS — R1031 Right lower quadrant pain: Secondary | ICD-10-CM

## 2016-10-07 DIAGNOSIS — R42 Dizziness and giddiness: Secondary | ICD-10-CM | POA: Diagnosis present

## 2016-10-07 DIAGNOSIS — Z7901 Long term (current) use of anticoagulants: Secondary | ICD-10-CM | POA: Diagnosis not present

## 2016-10-07 HISTORY — DX: Diverticulitis of intestine, part unspecified, without perforation or abscess without bleeding: K57.92

## 2016-10-07 LAB — URINALYSIS, ROUTINE W REFLEX MICROSCOPIC
Bilirubin Urine: NEGATIVE
Glucose, UA: NEGATIVE mg/dL
Hgb urine dipstick: NEGATIVE
Ketones, ur: NEGATIVE mg/dL
Nitrite: NEGATIVE
Protein, ur: NEGATIVE mg/dL
Specific Gravity, Urine: 1.02 (ref 1.005–1.030)
pH: 6 (ref 5.0–8.0)

## 2016-10-07 LAB — CBC
HCT: 40.1 % (ref 36.0–46.0)
Hemoglobin: 13.4 g/dL (ref 12.0–15.0)
MCH: 29.4 pg (ref 26.0–34.0)
MCHC: 33.4 g/dL (ref 30.0–36.0)
MCV: 87.9 fL (ref 78.0–100.0)
Platelets: 376 10*3/uL (ref 150–400)
RBC: 4.56 MIL/uL (ref 3.87–5.11)
RDW: 13.3 % (ref 11.5–15.5)
WBC: 12 10*3/uL — ABNORMAL HIGH (ref 4.0–10.5)

## 2016-10-07 LAB — BASIC METABOLIC PANEL
Anion gap: 11 (ref 5–15)
BUN: 9 mg/dL (ref 6–20)
CO2: 22 mmol/L (ref 22–32)
Calcium: 9.4 mg/dL (ref 8.9–10.3)
Chloride: 104 mmol/L (ref 101–111)
Creatinine, Ser: 0.73 mg/dL (ref 0.44–1.00)
GFR calc Af Amer: >60 mL/min (ref >60)
GFR calc non Af Amer: >60 mL/min (ref >60)
Glucose, Bld: 149 mg/dL — ABNORMAL HIGH (ref 65–99)
Potassium: 3.7 mmol/L (ref 3.5–5.1)
Sodium: 137 mmol/L (ref 135–145)

## 2016-10-07 LAB — CBG MONITORING, ED: Glucose-Capillary: 151 mg/dL — ABNORMAL HIGH (ref 65–99)

## 2016-10-07 LAB — POCT INR: INR: 2.9

## 2016-10-07 MED ORDER — ONDANSETRON 4 MG PO TBDP
4.0000 mg | ORAL_TABLET | Freq: Once | ORAL | Status: AC
Start: 2016-10-07 — End: 2016-10-07
  Administered 2016-10-07: 4 mg via ORAL
  Filled 2016-10-07: qty 1

## 2016-10-07 NOTE — Patient Instructions (Signed)
7/18- Today take 2mg    7/19-4mg   7/20- 4mg   7/21- 6mg   7/22-4mg   7/23- Appt.

## 2016-10-07 NOTE — ED Provider Notes (Signed)
[facility] DEPT Provider Note   CSN: [id number] Arrival date & time: 10/07/16  1014     History   Chief Complaint Chief Complaint  Patient presents with  . Dizziness  . Abdominal Pain    HPI [name] is a 61 y.o. female with history of a flutter, PAF, diverticulitis, DVT, GERD who presents today with chief complaint acute onset, somewhat resolved episode of lightheadedness and nausea which began earlier today around 9:30 AM. Patient was seen here recently on 10/04/2016 for abdominal pain and diagnosed with sigmoid diverticulitis for which she was started on Flagyl and Cipro. She followed up at Coumadin clinic as recommended, but states that on walking into the clinic she developed lightheadedness and nausea, worse with walking and standing. She notes that her symptoms improved on laying down here in the ED. She also endorses continued dull aching right lower quadrant pain which is intermittently sharp as well as intermittent left flank pain which has been ongoing since prior to her evaluation 3 days ago. She denies chest pain, SOB, melena, hematochezia, diarrhea, dysuria, hematuria. She does note that she has been having "hard, small amounts of stool during bowel movements". Has not tried anything for her symptoms. States that she has been eating and drinking normally. States she had fevers prior to her evaluation several days ago, but these have resolved.  The history is provided by the patient.  Abdominal Pain   Associated symptoms include nausea and constipation. Pertinent negatives include fever, diarrhea, vomiting, dysuria and hematuria.    Past Medical History:  Diagnosis Date  . Allergic rhinitis   . Arthritis    "back" (05/07/2016)  . Asthma   . Atrial flutter ([place])    a. Remotely per notes.  . Colon polyps   . Diverticulitis   . DVT (deep venous thrombosis) ([place]) 1980s x2 -    between birth of two sons. Saw hematology - was told she has a hypercoagulable  disorder, should be on Coumadin lifelong.  . Family history of adverse reaction to anesthesia    "mother gets PONV"  . GERD (gastroesophageal reflux disease)   . Halothane adverse reaction    narrow small opening  . Heart murmur   . History of hiatal hernia    "small one/CTA 05/01/2016" (05/07/2016)  . Hypercholesteremia    hx; "brought it down w/diet" (05/07/2016)  . Hyperglycemia    a. A1c 6.1 in 2014.  Marland Kitchen Normal cardiac stress test 06/2012  . Obesity   . OSA (obstructive sleep apnea)    a. Mild, did not tolerate CPAP (05/07/2016)  . PAF (paroxysmal atrial fibrillation) ([place])    a. s/p afib ablation at [facility] 2010 (Dr. Annabell Howells). b. Recurrent AF s/p DCCV 06/2010. c. On flecainide.   Marland Kitchen PONV (postoperative nausea and vomiting)   . PPD positive, treated 1977   "treated for 1 yr after exposure to patient"  . RSV infection ~ 2006  . Wandering (atrial) pacemaker    a. Remotely per notes.    Patient Active Problem List   Diagnosis Date Noted  . A-fib ([place]) 05/07/2016  . Obesity 01/10/2014  . Sleep apnea 01/10/2014  . Encounter for therapeutic drug monitoring 04/26/2013  . Essential hypertension 03/01/2013  . History of DVT (deep vein thrombosis) 01/12/2013  . Hyperglycemia 01/12/2013  . Chest pain 06/15/2012  . Shortness of breath 06/15/2012  . Dizziness 07/09/2010  . HYPERCHOLESTEROLEMIA 09/10/2009  . CARPAL TUNNEL SYNDROME 09/10/2009  . Asthma 09/10/2009  . TMJ SYNDROME  09/10/2009  . GERD 09/10/2009  . GALLBLADDER DISEASE 09/10/2009  . Paroxysmal atrial fibrillation ([place]) 09/09/2009    Past Surgical History:  Procedure Laterality Date  . ATRIAL ABLATION SURGERY  05/07/2016   "fib and flutter"  . ATRIAL FIBRILLATION ABLATION  2010   at [facility]  . ATRIAL FIBRILLATION ABLATION N/A 05/07/2016   Procedure: Atrial Fibrillation Ablation;  Surgeon: Thompson Grayer, MD;  Location: [facility] CV LAB;  Service: Cardiovascular;  Laterality: N/A;  . BREAST CYST ASPIRATION  Bilateral   . CARDIAC CATHETERIZATION  2008  . CARPAL TUNNEL RELEASE Bilateral   . CESAREAN SECTION  1986; 1988  . COLONOSCOPY  X 2  . COLONOSCOPY W/ BIOPSIES AND POLYPECTOMY  X 2  . ESOPHAGOGASTRODUODENOSCOPY    . INGUINAL HERNIA REPAIR Right   . LAPAROSCOPIC CHOLECYSTECTOMY  1989  . TMJ ARTHROPLASTY    . TUBAL LIGATION  1988    OB History    No data available       Home Medications    Prior to Admission medications   Medication Sig Start Date End Date Taking? Authorizing Provider  acetaminophen (TYLENOL) 650 MG CR tablet Take 1,300 mg by mouth 2 (two) times daily.    [provider]  cetirizine (ZYRTEC) 10 MG tablet Take 10 mg by mouth daily.      [provider]  Cholecalciferol (VITAMIN D3) 1000 UNITS CAPS Take 2,000 Units by mouth daily.     [provider]  ciprofloxacin (CIPRO) 500 MG tablet Take 1 tablet (500 mg total) by mouth every 12 (twelve) hours. 10/04/16 10/14/16  Tegeler, Gwenyth Allegra, MD  diltiazem (CARDIZEM) 30 MG tablet Take 1 tablet by mouth every 4 hours AS NEEDED for AFIB heart rate >100 as long as blood pressure >100. Patient not taking: Reported on 10/04/2016 03/18/16   Sherran Needs, NP  FLOVENT HFA 220 MCG/ACT inhaler Inhale 1 puff into the lungs 2 (two) times daily. 04/20/16   [provider]  fluticasone (FLONASE) 50 MCG/ACT nasal spray Place 1 spray into the nose 2 (two) times daily.  06/02/12   [provider]  losartan (COZAAR) 25 MG tablet Take 1 tablet (25 mg total) by mouth daily. 12/20/13   Allred, Jeneen Rinks, MD  metroNIDAZOLE (FLAGYL) 500 MG tablet Take 1 tablet (500 mg total) by mouth 3 (three) times daily. 10/04/16 10/14/16  Tegeler, Gwenyth Allegra, MD  Multiple Vitamin (MULTIVITAMIN WITH MINERALS) TABS tablet Take 1 tablet by mouth daily.    [provider]  omeprazole (PRILOSEC) 40 MG capsule Take 40 mg by mouth daily.  04/12/10   [provider]  ondansetron (ZOFRAN) 4 MG tablet Take 1  tablet (4 mg total) by mouth every 8 (eight) hours as needed for nausea or vomiting. 10/04/16   Tegeler, Gwenyth Allegra, MD  oxyCODONE-acetaminophen (PERCOCET/ROXICET) 5-325 MG tablet Take 1 tablet by mouth every 4 (four) hours as needed for severe pain. 10/04/16   Tegeler, Gwenyth Allegra, MD  polyethylene glycol [facility]) packet Take 17 g by mouth daily as needed (for constipation.).    [provider]  PROAIR HFA 108 (90 BASE) MCG/ACT inhaler Inhale 2 puffs into the lungs every 6 (six) hours as needed for wheezing or shortness of breath.  03/22/12   [provider]  venlafaxine XR (EFFEXOR-XR) 37.5 MG 24 hr capsule Take 37.5 mg by mouth daily with breakfast.  11/21/12   [provider]  verapamil (CALAN) 80 MG tablet Take 160 mg by mouth 2 (two)  times daily.    [provider]  warfarin (COUMADIN) 2 MG tablet TAKE AS DIRECTED BY COUMADIN CLINIC Patient taking differently: TAKE 6 MG (3 TABLETS) ON SUNDAY, TUESDAY, THURSDAY & SATURDAY THEN TAKE 4 MG (2 TABLETS) ON MONDAY, [place], & FRIDAY 05/04/16   Thompson Grayer, MD    Family History Family History  Problem Relation Age of Onset  . Stroke Mother   . Heart attack Mother        2 previous MIs, 3 stents - CAD first diagnosed 77  . Thyroid disease Mother   . Colon polyps Father   . Lupus Sister   . Heart attack Paternal Grandfather        Died at 70 of massive MI  . Stroke Maternal Grandfather        Multiple family members  . Stroke Paternal Grandmother   . Thyroid disease Sister        x 2    Social History Social History  Substance Use Topics  . Smoking status: Never Smoker  . Smokeless tobacco: Never Used  . Alcohol use Yes     Comment: 05/07/2016      Allergies   Aspirin; Ivp dye [iodinated diagnostic agents]; Latex; Meperidine hcl; Penicillins; Statins; Sulfonamide derivatives; and Food   Review of Systems Review of Systems  Constitutional: Negative for fever.  Respiratory: Negative for  shortness of breath.   Cardiovascular: Negative for chest pain.  Gastrointestinal: Positive for abdominal pain, constipation and nausea. Negative for diarrhea and vomiting.  Genitourinary: Positive for flank pain. Negative for dysuria and hematuria.  Neurological: Positive for light-headedness. Negative for syncope.     Physical Exam Updated Vital Signs BP (!) 147/74 (BP Location: Right Arm)   Pulse 77   Temp 98.7 F (37.1 C) (Oral)   Resp 18   Ht 5\' 5"  (1.651 m)   Wt 106.1 kg (234 lb)   SpO2 96%   BMI 38.94 kg/m   Physical Exam  Constitutional: She appears well-developed and well-nourished. No distress.  HENT:  Head: Normocephalic and atraumatic.  Eyes: Conjunctivae are normal. Right eye exhibits no discharge. Left eye exhibits no discharge.  Neck: No JVD present. No tracheal deviation present.  Cardiovascular: Normal rate and regular rhythm.   Pulmonary/Chest: Effort normal and breath sounds normal.  Abdominal: Soft. Bowel sounds are normal. She exhibits distension. She exhibits no mass. There is tenderness. There is no rebound and no guarding.  Tenderness to palpation in the suprapubic and right lower quadrant. Murphy's absent, Rovsing's present and tenderness to palpation at McBurney's point present. Left CVA tenderness present  Musculoskeletal: She exhibits no edema.  No midline spine TTP, left-sided paraspinal muscle and flank tenderness present, no deformity, crepitus, or step-off noted  Neurological: She is alert.  Skin: Skin is warm and dry. No erythema.  Psychiatric: She has a normal mood and affect. Her behavior is normal.  Nursing note and vitals reviewed.    ED Treatments / Results  Labs (all labs ordered are listed, but only abnormal results are displayed) Labs Reviewed  BASIC METABOLIC PANEL - Abnormal; Notable for the following:       Result Value   Glucose, Bld 149 (*)    All other components within normal limits  CBC - Abnormal; Notable for the  following:    WBC 12.0 (*)    All other components within normal limits  URINALYSIS, ROUTINE W REFLEX MICROSCOPIC - Abnormal; Notable for the following:    Leukocytes, UA TRACE (*)  Bacteria, UA RARE (*)    Squamous Epithelial / LPF 0-5 (*)    All other components within normal limits  CBG MONITORING, ED - Abnormal; Notable for the following:    Glucose-Capillary 151 (*)    All other components within normal limits    EKG  EKG Interpretation  Date/Time:  Wednesday October 07 2016 10:49:33 EDT Ventricular Rate:  90 PR Interval:  140 QRS Duration: 78 QT Interval:  378 QTC Calculation: 462 R Axis:   2 Text Interpretation:  Normal sinus rhythm Low voltage QRS Cannot rule out Anterior infarct , age undetermined Abnormal ECG Confirmed by Virgel Manifold [phone]) on 10/07/2016 11:42:33 AM       Radiology No results found.  Procedures Procedures (including critical care time)  Medications Ordered in ED Medications  ondansetron (ZOFRAN-ODT) disintegrating tablet 4 mg (4 mg Oral Given 10/07/16 1223)     Initial Impression / Assessment and Plan / ED Course  I have reviewed the triage vital signs and the nursing notes.  Pertinent labs & imaging results that were available during my care of the patient were reviewed by me and considered in my medical decision making (see chart for details).     Patient with episode of lightheadedness and nausea which occurred earlier today. Afebrile, vital signs are stable. No focal neurological deficits. EKG shows no significant changes from last and patient is in normal sinus rhythm. INR is within therapeutic limits. CBC shows nonspecific slight leukocytosis. With recent abdominal workup, do not see an indication for repeat imaging. Doubt appendicitis given normal imaging 3 days ago and no fever. Patient is not orthostatic. No significant electrolyte abnormalities. Doubt CVA, ACS/MI, or surgical abdominal pathology as cause for symptoms. No anemia,  doubt GI bleed. No further emergent workup required at this time. On reevaluation, patient states she is feeling better after Zofran. She is stable for discharge, encouraged patient to complete entire course of antibiotics and recommend follow-up with primary care in 3-4 days for reevaluation. Discussed indications for return to the ED. Pt verbalized understanding of and agreement with plan and is safe for discharge home at this time.  Final Clinical Impressions(s) / ED Diagnoses   Final diagnoses:  Near syncope  Nausea  Right lower quadrant abdominal pain    New Prescriptions New Prescriptions   No medications on file     Debroah Baller 10/07/16 1404    Virgel Manifold, MD 10/17/16 1445

## 2016-10-07 NOTE — Discharge Instructions (Signed)
Please take all of your antibiotics until finished!   You may develop abdominal discomfort or diarrhea from the antibiotic.  You may help offset this with probiotics which you can buy or get in yogurt. Do not eat  or take the probiotics until 2 hours after your antibiotic. You may take fiber supplement and drink plenty of water additionally. You may take Zofran which you have at home as needed for nausea. Eat a bland diet of foods that do not upset her stomach. Continue taking her Coumadin as prescribed. Follow-up with your primary care physician in the next 3-4 days for reevaluation. Return to the ED immediately if any concerning signs or symptoms develop such as chest pain, passing out, blood in the urine or stool, or severe pain.

## 2016-10-07 NOTE — ED Triage Notes (Signed)
Pt was tx for diverticulitis on Mon.  Has been taking antibiotics and states this am when she went into the coumadin clinic she began to experience dizziness and nausea.

## 2016-10-07 NOTE — ED Notes (Signed)
Pt ambulatory to and from bathroom to void, specimen obtained.  Gait steady, pt denies any dizziness.

## 2016-10-07 NOTE — ED Notes (Signed)
CBG 151. 

## 2016-10-07 NOTE — Telephone Encounter (Signed)
Pt called stating went to ER on July 15th diagnosed with Diverticulitis and given IV Flagyl and Cipro in the ER and sent home to take Flagyl 500 mg q 8 hours and Cipro 500 mg bid and instructed to take coumadin 4mg  on Monday and Tuesday Appt made to be seen today July 18th and pt states understanding

## 2016-10-12 ENCOUNTER — Ambulatory Visit (INDEPENDENT_AMBULATORY_CARE_PROVIDER_SITE_OTHER): Payer: 59

## 2016-10-12 DIAGNOSIS — I480 Paroxysmal atrial fibrillation: Secondary | ICD-10-CM | POA: Diagnosis not present

## 2016-10-12 DIAGNOSIS — Z5181 Encounter for therapeutic drug level monitoring: Secondary | ICD-10-CM

## 2016-10-12 DIAGNOSIS — I4891 Unspecified atrial fibrillation: Secondary | ICD-10-CM

## 2016-10-12 LAB — POCT INR: INR: 2.9

## 2016-10-13 DIAGNOSIS — R42 Dizziness and giddiness: Secondary | ICD-10-CM | POA: Diagnosis not present

## 2016-10-13 DIAGNOSIS — I10 Essential (primary) hypertension: Secondary | ICD-10-CM | POA: Diagnosis not present

## 2016-10-13 DIAGNOSIS — K5732 Diverticulitis of large intestine without perforation or abscess without bleeding: Secondary | ICD-10-CM | POA: Diagnosis not present

## 2016-10-13 DIAGNOSIS — E119 Type 2 diabetes mellitus without complications: Secondary | ICD-10-CM | POA: Diagnosis not present

## 2016-10-13 DIAGNOSIS — Z8679 Personal history of other diseases of the circulatory system: Secondary | ICD-10-CM | POA: Diagnosis not present

## 2016-10-21 ENCOUNTER — Other Ambulatory Visit: Payer: Self-pay | Admitting: Internal Medicine

## 2016-10-26 ENCOUNTER — Encounter: Payer: Self-pay | Admitting: Internal Medicine

## 2016-11-11 ENCOUNTER — Encounter: Payer: Self-pay | Admitting: Internal Medicine

## 2016-11-11 ENCOUNTER — Ambulatory Visit (INDEPENDENT_AMBULATORY_CARE_PROVIDER_SITE_OTHER): Payer: 59 | Admitting: Internal Medicine

## 2016-11-11 ENCOUNTER — Ambulatory Visit (INDEPENDENT_AMBULATORY_CARE_PROVIDER_SITE_OTHER): Payer: 59 | Admitting: *Deleted

## 2016-11-11 VITALS — BP 128/72 | HR 75 | Ht 65.0 in | Wt 238.0 lb

## 2016-11-11 DIAGNOSIS — Z5181 Encounter for therapeutic drug level monitoring: Secondary | ICD-10-CM

## 2016-11-11 DIAGNOSIS — I480 Paroxysmal atrial fibrillation: Secondary | ICD-10-CM

## 2016-11-11 DIAGNOSIS — I4891 Unspecified atrial fibrillation: Secondary | ICD-10-CM

## 2016-11-11 LAB — POCT INR: INR: 2.3

## 2016-11-11 NOTE — Patient Instructions (Signed)

## 2016-11-11 NOTE — Progress Notes (Signed)
PCP: Antony Contras, MD Primary Cardiologist: Dr Marlou Porch Primary EP: Dr Rayann Heman  [name] is a 62 y.o. female who presents today for routine electrophysiology followup.  Since last being seen in our clinic, the patient reports doing very well.  Today, she denies symptoms of palpitations, chest pain, shortness of breath,  lower extremity edema, dizziness, presyncope, or syncope. She has had recent diverticulitis.  The patient is otherwise without complaint today.  She enjoys visiting her 59 year old grandchild in [place].  Still works at [employer].  Past Medical History:  Diagnosis Date  . Allergic rhinitis   . Arthritis    "back" (05/07/2016)  . Asthma   . Atrial flutter ([place])    a. Remotely per notes.  . Colon polyps   . Diverticulitis   . DVT (deep venous thrombosis) ([place]) 1980s x2 -    between birth of two sons. Saw hematology - was told she has a hypercoagulable disorder, should be on Coumadin lifelong.  . Family history of adverse reaction to anesthesia    "mother gets PONV"  . GERD (gastroesophageal reflux disease)   . Halothane adverse reaction    narrow small opening  . Heart murmur   . History of hiatal hernia    "small one/CTA 05/01/2016" (05/07/2016)  . Hypercholesteremia    hx; "brought it down w/diet" (05/07/2016)  . Hyperglycemia    a. A1c 6.1 in 2014.  Marland Kitchen Normal cardiac stress test 06/2012  . Obesity   . OSA (obstructive sleep apnea)    a. Mild, did not tolerate CPAP (05/07/2016)  . PAF (paroxysmal atrial fibrillation) ([place])    a. s/p afib ablation at [facility] 2010 (Dr. Annabell Howells). b. Recurrent AF s/p DCCV 06/2010. c. On flecainide.   Marland Kitchen PONV (postoperative nausea and vomiting)   . PPD positive, treated 1977   "treated for 1 yr after exposure to patient"  . RSV infection ~ 2006  . Wandering (atrial) pacemaker    a. Remotely per notes.   Past Surgical History:  Procedure Laterality Date  . ATRIAL ABLATION SURGERY  05/07/2016   "fib and flutter"  . ATRIAL  FIBRILLATION ABLATION  2010   at [facility]  . ATRIAL FIBRILLATION ABLATION N/A 05/07/2016   Procedure: Atrial Fibrillation Ablation;  Surgeon: Thompson Grayer, MD;  Location: [facility] CV LAB;  Service: Cardiovascular;  Laterality: N/A;  . BREAST CYST ASPIRATION Bilateral   . CARDIAC CATHETERIZATION  2008  . CARPAL TUNNEL RELEASE Bilateral   . CESAREAN SECTION  1986; 1988  . COLONOSCOPY  X 2  . COLONOSCOPY W/ BIOPSIES AND POLYPECTOMY  X 2  . ESOPHAGOGASTRODUODENOSCOPY    . INGUINAL HERNIA REPAIR Right   . LAPAROSCOPIC CHOLECYSTECTOMY  1989  . TMJ ARTHROPLASTY    . TUBAL LIGATION  1988    ROS- all systems are reviewed and negatives except as per HPI above  Current Outpatient Prescriptions  Medication Sig Dispense Refill  . acetaminophen (TYLENOL) 650 MG CR tablet Take 1,300 mg by mouth 2 (two) times daily.    . cetirizine (ZYRTEC) 10 MG tablet Take 10 mg by mouth daily.      . Cholecalciferol (VITAMIN D3) 1000 UNITS CAPS Take 2,000 Units by mouth daily.     Marland Kitchen FLOVENT HFA 220 MCG/ACT inhaler Inhale 1 puff into the lungs 2 (two) times daily.  1  . fluticasone (FLONASE) 50 MCG/ACT nasal spray Place 1 spray into the nose 2 (two) times daily.     Marland Kitchen losartan (  COZAAR) 25 MG tablet Take 1 tablet (25 mg total) by mouth daily. 90 tablet 0  . Multiple Vitamin (MULTIVITAMIN WITH MINERALS) TABS tablet Take 1 tablet by mouth daily.    Marland Kitchen omeprazole (PRILOSEC) 40 MG capsule Take 40 mg by mouth daily.     . polyethylene glycol (MIRALAX) packet Take 17 g by mouth daily as needed (for constipation.).    Marland Kitchen PROAIR HFA 108 (90 BASE) MCG/ACT inhaler Inhale 2 puffs into the lungs every 6 (six) hours as needed for wheezing or shortness of breath.     . venlafaxine XR (EFFEXOR-XR) 37.5 MG 24 hr capsule Take 37.5 mg by mouth daily with breakfast.     . verapamil (CALAN) 80 MG tablet Take 160 mg by mouth 2 (two) times daily.    Marland Kitchen warfarin (COUMADIN) 2 MG tablet TAKE AS DIRECTED BY COUMADIN CLINIC 225 tablet 0     Current Facility-Administered Medications  Medication Dose Route Frequency Provider Last Rate Last Dose  . triamcinolone acetonide (KENALOG) 10 MG/ML injection 10 mg  10 mg Other Once Harriet Masson, DPM      . triamcinolone acetonide (KENALOG) 10 MG/ML injection 10 mg  10 mg Other Once Harriet Masson, DPM        Physical Exam: Vitals:   11/11/16 0757  BP: 128/72  Pulse: 75  Weight: 238 lb (108 kg)  Height: 5\' 5"  (1.651 m)    GEN- The patient is well appearing, alert and oriented x 3 today.   Head- normocephalic, atraumatic Eyes-  Sclera clear, conjunctiva pink Ears- hearing intact Oropharynx- clear Lungs- Clear to ausculation bilaterally, normal work of breathing Heart- Regular rate and rhythm, no murmurs, rubs or gallops, PMI not laterally displaced GI- soft, NT, ND, + BS Extremities- no clubbing, cyanosis, or edema  EKG tracing ordered today is personally reviewed and shows sinus rhythm 75 bpm, otherwise normal ekg  Assessment and Plan:  1. afib Well controlled off AAD therapy post ablation Remains on coumadin and doing well with this  2. HTN Stable No change required today  3. Mild OSA Has been evaluated by Dr Maxwell Caul,  Conservative measures advised  Return to see me in 3 months  Thompson Grayer MD, [facility] 11/11/2016 8:30 AM

## 2016-11-19 ENCOUNTER — Encounter: Payer: Self-pay | Admitting: Gastroenterology

## 2016-11-24 ENCOUNTER — Ambulatory Visit (INDEPENDENT_AMBULATORY_CARE_PROVIDER_SITE_OTHER): Payer: 59 | Admitting: Physician Assistant

## 2016-11-24 ENCOUNTER — Encounter: Payer: Self-pay | Admitting: Physician Assistant

## 2016-11-24 ENCOUNTER — Other Ambulatory Visit (INDEPENDENT_AMBULATORY_CARE_PROVIDER_SITE_OTHER): Payer: 59

## 2016-11-24 VITALS — BP 152/80 | HR 88 | Ht 64.0 in | Wt 236.2 lb

## 2016-11-24 DIAGNOSIS — R1084 Generalized abdominal pain: Secondary | ICD-10-CM | POA: Diagnosis not present

## 2016-11-24 DIAGNOSIS — R1032 Left lower quadrant pain: Secondary | ICD-10-CM | POA: Diagnosis not present

## 2016-11-24 DIAGNOSIS — K5732 Diverticulitis of large intestine without perforation or abscess without bleeding: Secondary | ICD-10-CM

## 2016-11-24 DIAGNOSIS — R109 Unspecified abdominal pain: Secondary | ICD-10-CM

## 2016-11-24 LAB — CBC WITH DIFFERENTIAL/PLATELET
BASOS ABS: 0.1 10*3/uL (ref 0.0–0.1)
BASOS PCT: 1.3 % (ref 0.0–3.0)
EOS ABS: 0.1 10*3/uL (ref 0.0–0.7)
Eosinophils Relative: 1.8 % (ref 0.0–5.0)
HEMATOCRIT: 40.5 % (ref 36.0–46.0)
Hemoglobin: 13.4 g/dL (ref 12.0–15.0)
LYMPHS PCT: 46 % (ref 12.0–46.0)
Lymphs Abs: 2.6 10*3/uL (ref 0.7–4.0)
MCHC: 33 g/dL (ref 30.0–36.0)
MCV: 88.4 fl (ref 78.0–100.0)
MONO ABS: 0.4 10*3/uL (ref 0.1–1.0)
Monocytes Relative: 7.4 % (ref 3.0–12.0)
Neutro Abs: 2.5 10*3/uL (ref 1.4–7.7)
Neutrophils Relative %: 43.5 % (ref 43.0–77.0)
Platelets: 417 10*3/uL — ABNORMAL HIGH (ref 150.0–400.0)
RBC: 4.58 Mil/uL (ref 3.87–5.11)
RDW: 14 % (ref 11.5–15.5)
WBC: 5.7 10*3/uL (ref 4.0–10.5)

## 2016-11-24 MED ORDER — DICYCLOMINE HCL 10 MG PO CAPS: ORAL_CAPSULE | ORAL | 1 refills | Status: DC

## 2016-11-24 MED ORDER — METRONIDAZOLE 500 MG PO TABS: 500.0000 mg | ORAL_TABLET | Freq: Two times a day (BID) | ORAL | 0 refills | Status: AC

## 2016-11-24 MED ORDER — CIPROFLOXACIN HCL 500 MG PO TABS: 500.0000 mg | ORAL_TABLET | Freq: Two times a day (BID) | ORAL | 0 refills | Status: AC

## 2016-11-24 NOTE — Progress Notes (Signed)
Subjective:    Patient ID: [name], female    DOB: April 11, 1955, 61 y.o.   MRN: [id number]  HPI [name] is a pleasant 61 year old white female, previously known to Dr. Delfin Edis, who comes in today with complaints of persistent lower abdominal pain and mild diarrhea. Patient has history of atrial fibrillation for which she is on Coumadin, hypertension, asthma, GERD, remote history of DVT, prior cholecystectomy and diverticulosis. She had undergone colonoscopy in April 2015 with finding of a 3-5 mm polyp in the rectum which was hyperplastic and moderate left colon diverticulosis. Patient states she had an ER visit on 10/04/2016 with lower abdominal pain which was more right-sided and suprapubic. She underwent CT of the abdomen and pelvis which showed sigmoid diverticulitis with focal wall thickening of the sigmoid colon and some pericolonic inflammatory changes. She was started on a course of Cipro and Flagyl. After that she saw her PCP who told her to stop the antibiotics after 7 days. She did that but did not feel that her symptoms had completely resolved. A couple of weeks later symptoms were worse and she finished the course of metronidazole for 3-4 more days. Since then she has had ongoing lower abdominal discomfort and pain prior to bowel movements. She says she gets pain in her suprapubic area and around her bladder which eases off after a bowel movement. Stools have been loose to soft which is unusual for her. She has not noticed any bleeding. She did have some fever at the onset of illness but has not had any fever or chills over the past couple of weeks. Appetite has been okay. She does feel that symptoms are aggravated postprandially.  Review of Systems Pertinent positive and negative review of systems were noted in the above HPI section.  All other review of systems was otherwise negative.  Outpatient Encounter Prescriptions as of 11/24/2016  Medication Sig  . acetaminophen (TYLENOL)  650 MG CR tablet Take 1,300 mg by mouth 2 (two) times daily.  . cetirizine (ZYRTEC) 10 MG tablet Take 10 mg by mouth daily.    . Cholecalciferol (VITAMIN D3) 1000 UNITS CAPS Take 2,000 Units by mouth daily.   Marland Kitchen FLOVENT HFA 220 MCG/ACT inhaler Inhale 1 puff into the lungs 2 (two) times daily.  . fluticasone (FLONASE) 50 MCG/ACT nasal spray Place 1 spray into the nose 2 (two) times daily.   Marland Kitchen losartan (COZAAR) 25 MG tablet Take 1 tablet (25 mg total) by mouth daily.  . Multiple Vitamin (MULTIVITAMIN WITH MINERALS) TABS tablet Take 1 tablet by mouth daily.  Marland Kitchen omeprazole (PRILOSEC) 40 MG capsule Take 40 mg by mouth daily.   . polyethylene glycol (MIRALAX) packet Take 17 g by mouth daily as needed (for constipation.).  Marland Kitchen PROAIR HFA 108 (90 BASE) MCG/ACT inhaler Inhale 2 puffs into the lungs every 6 (six) hours as needed for wheezing or shortness of breath.   . venlafaxine XR (EFFEXOR-XR) 37.5 MG 24 hr capsule Take 37.5 mg by mouth daily with breakfast.   . verapamil (CALAN) 80 MG tablet Take 160 mg by mouth 2 (two) times daily.  Marland Kitchen warfarin (COUMADIN) 2 MG tablet TAKE AS DIRECTED BY COUMADIN CLINIC  . ciprofloxacin (CIPRO) 500 MG tablet Take 1 tablet (500 mg total) by mouth 2 (two) times daily.  Marland Kitchen dicyclomine (BENTYL) 10 MG capsule Take 1 tablet by mouth 30 min before meals and at bedtime as needed for pain, cramping.  . metroNIDAZOLE (FLAGYL) 500 MG tablet Take 1 tablet (  500 mg total) by mouth 2 (two) times daily.   Facility-Administered Encounter Medications as of 11/24/2016  Medication  . triamcinolone acetonide (KENALOG) 10 MG/ML injection 10 mg  . triamcinolone acetonide (KENALOG) 10 MG/ML injection 10 mg   Allergies  Allergen Reactions  . Aspirin Other (See Comments)    Makes asthma worse, makes stomach hurt  . Ivp Dye [Iodinated Diagnostic Agents] Anaphylaxis    Required epinephrine. Since then, has tolerated with premed.  . Latex Other (See Comments)    Wheezing/cough   . Meperidine  Hcl Other (See Comments)    Projectile vomiting   . Penicillins Anaphylaxis    Has patient had a PCN reaction causing immediate rash, facial/tongue/throat swelling, SOB or lightheadedness with hypotension:Yes Has patient had a PCN reaction causing severe rash involving mucus membranes or skin necrosis:Yes Has patient had a PCN reaction that required hospitalization:Treated in ER w/epi & breathing treatments Has patient had a PCN reaction occurring within the last 10 years:No If all of the above answers are "NO", then may proceed with Cephalosporin use.   . Statins Other (See Comments)    Severe leg pain - has tried most of them except the newest one  . Sulfonamide Derivatives Anaphylaxis  . Food     Nuts-mucus membranes inflamed/cough Bananas-mucus membranes inflamed/cough   Patient Active Problem List   Diagnosis Date Noted  . Diverticulitis of colon 11/24/2016  . A-fib ([place]) 05/07/2016  . Obesity 01/10/2014  . Sleep apnea 01/10/2014  . Encounter for therapeutic drug monitoring 04/26/2013  . Essential hypertension 03/01/2013  . History of DVT (deep vein thrombosis) 01/12/2013  . Hyperglycemia 01/12/2013  . Chest pain 06/15/2012  . Shortness of breath 06/15/2012  . Dizziness 07/09/2010  . HYPERCHOLESTEROLEMIA 09/10/2009  . CARPAL TUNNEL SYNDROME 09/10/2009  . Asthma 09/10/2009  . TMJ SYNDROME 09/10/2009  . GERD 09/10/2009  . GALLBLADDER DISEASE 09/10/2009  . Paroxysmal atrial fibrillation ([place]) 09/09/2009   Social History   Social History  . Marital status: Married    Spouse name: N/A  . Number of children: 2  . Years of education: N/A   Occupational History  . RN [employer]   Social History Main Topics  . Smoking status: Never Smoker  . Smokeless tobacco: Never Used  . Alcohol use Yes     Comment: 05/07/2016   . Drug use: No  . Sexual activity: Not Currently   Other Topics Concern  . Not on file   Social History Narrative  . No narrative on  file    Ms. [name]'s family history includes Colon polyps in her father; Heart attack in her mother and paternal grandfather; Lupus in her sister; Stroke in her maternal grandfather, mother, and paternal grandmother; Thyroid disease in her mother and sister.      Objective:    Vitals:   11/24/16 0920  BP: (!) 152/80  Pulse: 88    Physical Exam  well-developed older white female in no acute distress, pleasant blood pressure 152/80 pulse 88, height 5 foot 4, weight 236, BMI 40.5. HEENT; nontraumatic, cephalic EOMI PERRLA sclera anicteric, Cardiovascular; regular rate and rhythm with S1-S2 no murmur or gallop, Pulmonary ;clear bilaterally, Abdomen; soft, she has some tenderness in the left lower quadrant and suprapubic area there is no guarding or rebound no palpable mass or hepatosplenomegaly bowel sounds present, Rectal ;exam not done, Extremities ;no clubbing cyanosis or edema skin warm and dry, Neuropsych; mood and affect appropriate  Assessment & Plan:   #20 61 year old white female with subacute/partially treated diverticulitis with persistent left lower quadrant and suprapubic abdominal pain several weeks after initial diagnosis by CT. #2 history of hyperplastic colon polyps-up-to-date with colonoscopy last done in April 2015 #3 chronic anticoagulation with Coumadin #4 atrial fibrillation #5 hypertension #6 GERD #7 status post cholecystectomy  Plan; Start Cipro 500 mg by mouth twice a day 14 days Start Flagyl 500 mg by mouth twice a day 14 days Start dicyclomine 10 mg by mouth one half hour before meals and daily at bedtime when necessary for abdominal cramping and discomfort Check CBC with differential today Patient is asked to call if her symptoms have not completely resolved after she completes the course of antibiotics, or if symptoms worsen at any point in the interim . At that point she would need reimaging with CT. She will contact her Coumadin clinic to have her  INR checked in about a week after initiating antibiotics. Patient had a previously scheduled appointment with Dr. Havery Moros to establish in early October and she will keep that appointment.  [name] S Gwynn Chalker PA-C 11/24/2016   Cc: Antony Contras, MD

## 2016-11-24 NOTE — Progress Notes (Signed)
Agree with assessment and plan as outlined. If this is her first episode of diverticulitis, we may consider performing a colonoscopy once she has recovered from this episode (recommend to have it done unless last colonoscopy was < 1 year from time of diagnosis). I will see her in October to discuss and reassess

## 2016-11-24 NOTE — Patient Instructions (Addendum)
Please go to the basement level to have your labs drawn.  We have sent the following medications to your pharmacy for you to pick up at your convenience: [facility]. 1. Cipro 500 mg 2. Flagyl 500 mg ( Metronidazole)  3. Bentyl 10 mg ( Dicyclomine)

## 2016-12-09 ENCOUNTER — Ambulatory Visit (INDEPENDENT_AMBULATORY_CARE_PROVIDER_SITE_OTHER): Payer: 59 | Admitting: Pharmacist

## 2016-12-09 DIAGNOSIS — I480 Paroxysmal atrial fibrillation: Secondary | ICD-10-CM | POA: Diagnosis not present

## 2016-12-09 DIAGNOSIS — Z5181 Encounter for therapeutic drug level monitoring: Secondary | ICD-10-CM

## 2016-12-09 DIAGNOSIS — I4891 Unspecified atrial fibrillation: Secondary | ICD-10-CM | POA: Diagnosis not present

## 2016-12-09 LAB — POCT INR: INR: 2.2

## 2016-12-22 ENCOUNTER — Ambulatory Visit: Payer: 59 | Admitting: Gastroenterology

## 2016-12-22 NOTE — Progress Notes (Signed)
No show letter sent.

## 2017-01-06 ENCOUNTER — Ambulatory Visit (INDEPENDENT_AMBULATORY_CARE_PROVIDER_SITE_OTHER): Payer: 59

## 2017-01-06 DIAGNOSIS — I4891 Unspecified atrial fibrillation: Secondary | ICD-10-CM | POA: Diagnosis not present

## 2017-01-06 DIAGNOSIS — I480 Paroxysmal atrial fibrillation: Secondary | ICD-10-CM

## 2017-01-06 DIAGNOSIS — Z5181 Encounter for therapeutic drug level monitoring: Secondary | ICD-10-CM | POA: Diagnosis not present

## 2017-01-06 LAB — POCT INR: INR: 3.4

## 2017-01-17 ENCOUNTER — Other Ambulatory Visit: Payer: Self-pay | Admitting: Internal Medicine

## 2017-01-19 DIAGNOSIS — Z13820 Encounter for screening for osteoporosis: Secondary | ICD-10-CM | POA: Diagnosis not present

## 2017-01-19 DIAGNOSIS — Z01419 Encounter for gynecological examination (general) (routine) without abnormal findings: Secondary | ICD-10-CM | POA: Diagnosis not present

## 2017-01-19 DIAGNOSIS — Z6841 Body Mass Index (BMI) 40.0 and over, adult: Secondary | ICD-10-CM | POA: Diagnosis not present

## 2017-01-19 DIAGNOSIS — Z1231 Encounter for screening mammogram for malignant neoplasm of breast: Secondary | ICD-10-CM | POA: Diagnosis not present

## 2017-01-28 DIAGNOSIS — E1169 Type 2 diabetes mellitus with other specified complication: Secondary | ICD-10-CM | POA: Diagnosis not present

## 2017-01-28 DIAGNOSIS — I10 Essential (primary) hypertension: Secondary | ICD-10-CM | POA: Diagnosis not present

## 2017-01-28 DIAGNOSIS — J45909 Unspecified asthma, uncomplicated: Secondary | ICD-10-CM | POA: Diagnosis not present

## 2017-01-28 DIAGNOSIS — E782 Mixed hyperlipidemia: Secondary | ICD-10-CM | POA: Diagnosis not present

## 2017-01-28 DIAGNOSIS — K219 Gastro-esophageal reflux disease without esophagitis: Secondary | ICD-10-CM | POA: Diagnosis not present

## 2017-01-28 DIAGNOSIS — N951 Menopausal and female climacteric states: Secondary | ICD-10-CM | POA: Diagnosis not present

## 2017-01-28 DIAGNOSIS — M5431 Sciatica, right side: Secondary | ICD-10-CM | POA: Diagnosis not present

## 2017-01-28 DIAGNOSIS — R600 Localized edema: Secondary | ICD-10-CM | POA: Diagnosis not present

## 2017-02-01 ENCOUNTER — Telehealth: Payer: Self-pay | Admitting: Gastroenterology

## 2017-02-01 NOTE — Telephone Encounter (Signed)
Patient states that since last Thursday has been having RLQ pain, loose stools with mucus (not watery), denies any blood in stools, no fever. She has been taking Bentyl every 6 hours and it is not helping with the pain.

## 2017-02-01 NOTE — Telephone Encounter (Signed)
If she is having a lot of diarrhea which is new for her, given her recent antibiotic use, we may want to test her for C Diff to ensure negative. Otherwise if this is c/w with her prior diverticulitis symptoms, may need to consider another course of antibiotics. I was supposed to see her in October but don't think she followed up. She should follow up with me in clinic for reassessment. If we treat her for diverticulitis again she may warrant a colonoscopy

## 2017-02-02 ENCOUNTER — Other Ambulatory Visit: Payer: Self-pay

## 2017-02-02 MED ORDER — METRONIDAZOLE 500 MG PO TABS: 500.0000 mg | ORAL_TABLET | Freq: Three times a day (TID) | ORAL | 0 refills | Status: AC

## 2017-02-02 MED ORDER — CIPROFLOXACIN HCL 500 MG PO TABS: 500.0000 mg | ORAL_TABLET | Freq: Two times a day (BID) | ORAL | 0 refills | Status: DC

## 2017-02-02 NOTE — Telephone Encounter (Signed)
Patient is on coumadin, warning came up about accumulation affects of anticoagulation therapy. Please advise.

## 2017-02-02 NOTE — Telephone Encounter (Signed)
Patient advised to contact her coumadin clinic to ask about dosage reduction and monitoring INR during the antibiotic course.

## 2017-02-02 NOTE — Telephone Encounter (Signed)
Okay. Let's treat her for diverticulitis with Ciprofloxacin 500mg  BID and Flagyl 500mg  TID for 7 days. Hopefully this helps. I will see her in December in clinic for follow up.

## 2017-02-02 NOTE — Telephone Encounter (Signed)
Patient describes her symptoms c/w prior diverticulitis flare ups. She does have an appointment scheduled to see you on 02/25/17. [facility] [employer] [facility]. Please advise.

## 2017-02-02 NOTE — Telephone Encounter (Signed)
She should take the antibiotics but may need to decrease her coumadin dose. She should call the coumadin clinic to ask for instructions while on antibiotics, they may decrease her dose or monitor her INR closely during this time. Thanks for letting me know and catching this

## 2017-02-03 ENCOUNTER — Ambulatory Visit (INDEPENDENT_AMBULATORY_CARE_PROVIDER_SITE_OTHER): Payer: 59

## 2017-02-03 DIAGNOSIS — Z5181 Encounter for therapeutic drug level monitoring: Secondary | ICD-10-CM

## 2017-02-03 DIAGNOSIS — I480 Paroxysmal atrial fibrillation: Secondary | ICD-10-CM

## 2017-02-03 DIAGNOSIS — I4891 Unspecified atrial fibrillation: Secondary | ICD-10-CM | POA: Diagnosis not present

## 2017-02-03 LAB — POCT INR: INR: 3.4

## 2017-02-03 NOTE — Patient Instructions (Signed)
Skip tomorrow's dosage of Coumadin, then start taking 4mg  daily while on Cipro and Flagyl.  Recheck in 1 week.

## 2017-02-09 ENCOUNTER — Ambulatory Visit (INDEPENDENT_AMBULATORY_CARE_PROVIDER_SITE_OTHER): Payer: 59 | Admitting: *Deleted

## 2017-02-09 DIAGNOSIS — Z5181 Encounter for therapeutic drug level monitoring: Secondary | ICD-10-CM | POA: Diagnosis not present

## 2017-02-09 DIAGNOSIS — Z86718 Personal history of other venous thrombosis and embolism: Secondary | ICD-10-CM | POA: Diagnosis not present

## 2017-02-09 DIAGNOSIS — I4891 Unspecified atrial fibrillation: Secondary | ICD-10-CM | POA: Diagnosis not present

## 2017-02-09 DIAGNOSIS — I480 Paroxysmal atrial fibrillation: Secondary | ICD-10-CM | POA: Diagnosis not present

## 2017-02-09 LAB — POCT INR: INR: 1.9

## 2017-02-09 NOTE — Patient Instructions (Signed)
Return to previous dose of coumadin 6mg  daily except 4mg  on Mondays Wednesdays and Fridays Recheck INR in 4 weeks

## 2017-02-25 ENCOUNTER — Other Ambulatory Visit (INDEPENDENT_AMBULATORY_CARE_PROVIDER_SITE_OTHER): Payer: 59

## 2017-02-25 ENCOUNTER — Telehealth: Payer: Self-pay

## 2017-02-25 ENCOUNTER — Ambulatory Visit (INDEPENDENT_AMBULATORY_CARE_PROVIDER_SITE_OTHER): Payer: 59 | Admitting: Gastroenterology

## 2017-02-25 ENCOUNTER — Encounter: Payer: Self-pay | Admitting: Gastroenterology

## 2017-02-25 VITALS — BP 126/80 | HR 80 | Ht 65.0 in | Wt 236.0 lb

## 2017-02-25 DIAGNOSIS — K760 Fatty (change of) liver, not elsewhere classified: Secondary | ICD-10-CM | POA: Diagnosis not present

## 2017-02-25 DIAGNOSIS — Z7901 Long term (current) use of anticoagulants: Secondary | ICD-10-CM | POA: Diagnosis not present

## 2017-02-25 DIAGNOSIS — K5732 Diverticulitis of large intestine without perforation or abscess without bleeding: Secondary | ICD-10-CM

## 2017-02-25 DIAGNOSIS — K5792 Diverticulitis of intestine, part unspecified, without perforation or abscess without bleeding: Secondary | ICD-10-CM

## 2017-02-25 LAB — COMPREHENSIVE METABOLIC PANEL
ALK PHOS: 90 U/L (ref 39–117)
ALT: 21 U/L (ref 0–35)
AST: 19 U/L (ref 0–37)
Albumin: 4.3 g/dL (ref 3.5–5.2)
BILIRUBIN TOTAL: 0.3 mg/dL (ref 0.2–1.2)
BUN: 10 mg/dL (ref 6–23)
CO2: 30 mEq/L (ref 19–32)
Calcium: 9.5 mg/dL (ref 8.4–10.5)
Chloride: 99 mEq/L (ref 96–112)
Creatinine, Ser: 0.64 mg/dL (ref 0.40–1.20)
GFR: 99.68 mL/min (ref >60.00)
GLUCOSE: 103 mg/dL — AB (ref 70–99)
Potassium: 3.5 mEq/L (ref 3.5–5.1)
Sodium: 138 mEq/L (ref 135–145)
TOTAL PROTEIN: 7.1 g/dL (ref 6.0–8.3)

## 2017-02-25 MED ORDER — PEG-KCL-NACL-NASULF-NA ASC-C 140 G PO SOLR: 1.0000 | Freq: Once | ORAL | 0 refills | Status: AC

## 2017-02-25 NOTE — Telephone Encounter (Signed)
Patient with diagnosis of Afib on warfarin for anticoagulation.  She was also diagnosed with hypercoagulable state after recurrent DVT.   Procedure: colonoscopy Date of procedure: 04/06/17  CHADS2-VASc score of  At least 2 (CHF, HTN, AGE, DM2, stroke/tia x 2, CAD, AGE, female)  CrCl 178mL/min  Per office protocol, patient can hold warfarin for 5 days prior to procedure.   Patient will need bridging with Lovenox (enoxaparin) around procedure.

## 2017-02-25 NOTE — Patient Instructions (Addendum)
If you are age 62 or older, your body mass index should be between 23-30. Your Body mass index is 39.27 kg/m. If this is out of the aforementioned range listed, please consider follow up with your Primary Care Provider.  If you are age 56 or younger, your body mass index should be between 19-25. Your Body mass index is 39.27 kg/m. If this is out of the aformentioned range listed, please consider follow up with your Primary Care Provider.   You have been scheduled for a colonoscopy. Please follow written instructions given to you at your visit today.  Please pick up your prep supplies at the pharmacy within the next 1-3 days. If you use inhalers (even only as needed), please bring them with you on the day of your procedure. Your physician has requested that you go to www.startemmi.com and enter the access code given to you at your visit today. This web site gives a general overview about your procedure. However, you should still follow specific instructions given to you by our office regarding your preparation for the procedure.  Your physician has requested that you go to the basement for the following lab work before leaving today: CMET  You have been scheduled for a CT scan of the abdomen and pelvis at [facility] are scheduled on Thursday, Dec. 13th  at [place] should arrive 15 minutes prior to your appointment time for registration. Please follow the written instructions below on the day of your exam:  WARNING: IF YOU ARE ALLERGIC TO IODINE/X-RAY DYE, PLEASE NOTIFY RADIOLOGY IMMEDIATELY AT [phone]! YOU WILL BE GIVEN A 13 HOUR PREMEDICATION PREP.  1) Do not eat or drink anything after 4:00am (4 hours prior to your test) 2) You have been given 2 bottles of oral contrast to drink. The solution may taste  better if refrigerated, but do NOT add ice or any other liquid to this solution. Shake  well before drinking.    Drink 1 bottle of contrast @ 6:00am (2 hours prior to your exam)    Drink 1 bottle of contrast @ 7:00am (1 hour prior to your exam)  You may take any medications as prescribed with a small amount of water except for the following: Metformin, Glucophage, Glucovance, Avandamet, Riomet, Fortamet, Actoplus Met, Janumet, Glumetza or Metaglip. The above medications must be held the day of the exam AND 48 hours after the exam.  The purpose of you drinking the oral contrast is to aid in the visualization of your intestinal tract. The contrast solution may cause some diarrhea. Before your exam is started, you will be given a small amount of fluid to drink. Depending on your individual set of symptoms, you may also receive an intravenous injection of x-ray contrast/dye. This test typically takes 30-45 minutes to complete.  If you have any questions regarding your exam or if you need to reschedule, you may call [phone] the hours of 8:00 am and 5:00 pm, Monday-Friday.  ________________________________________________________________________  Take 50 mg of Benadryl 2 hours before procedure.  Take Bentyl as needed. Begin taking a daily fiber supplement.   Thank you for entrusting me with your care, Dr. [place] Cellar

## 2017-02-25 NOTE — Telephone Encounter (Signed)
   Tomeika Weinmann [facility] [id number]  Dear Allred:  We have scheduled the above named patient for a colonoscopy procedure. Our records show that she is on anticoagulation therapy.  Please advise as to whether the patient may come off their therapy of Coumadin 5 days prior to their procedure which is scheduled for 04-06-17.  Please route your response to Lemar Lofty, CMA or fax response to [phone].  Sincerely,    [facility]

## 2017-02-25 NOTE — Progress Notes (Signed)
HPI :  61 year old female here for a follow-up visit for diverticulitis and abdominal pain. She has history of atrial fibrillation for which she is on Coumadin, and remote history of DVT.   She presented with lower abdominal pain which was mostly in the right lower quadrant in July 2018. CT scan at the time showed sigmoid diverticulitis. She was treated with a course of Cipro and Flagyl which improved her symptoms but did not resolve them. She followed up with [place] in September with similar symptoms, and was given another course of antibiotics which again provided benefit to her symptoms but did not resolve them. She is taking Bentyl for abdominal cramps which does help. She has intermittent right lower quadrant pain, she states this is mild at this time, although has some tenderness. She continues to feel intermittent bloating and feels that her bowel habits are slightly altered. She has small frequent bowel movements in the morning without blood. She feels her stomach has not felt back to normal since symptoms started in July and has never really resolved. Abdominal cramps and spasms bothers her most.  She is on Coumadin but has no history of stroke or TIA, last echocardiogram showed a normal ejection fraction. She has no history of colon cancer.  Incidentally noted on her imaging she has mild fatty liver. She has had a mild AST elevation on last LFTs. He denies a prior history of liver disease. She has been working on weight loss and has lost 20 pounds in the past several months.   Last colonoscopy April 2015 - hyperplastic polyp and moderate left sided diverticulitis.      Past Medical History:  Diagnosis Date  . Allergic rhinitis   . Arthritis    "back" (05/07/2016)  . Asthma   . Atrial flutter ([place])    a. Remotely per notes.  . Carpal tunnel syndrome, bilateral   . Colon polyps   . Diverticulitis   . DM (diabetes mellitus) ([place])   . DVT (deep venous thrombosis) ([place]) 1980s x2 -   between birth of two sons. Saw hematology - was told she has a hypercoagulable disorder, should be on Coumadin lifelong.  . Family history of adverse reaction to anesthesia    "mother gets PONV"  . Gallbladder disease   . GERD (gastroesophageal reflux disease)   . Halothane adverse reaction    narrow small opening  . Heart murmur   . History of hiatal hernia    "small one/CTA 05/01/2016" (05/07/2016)  . HTN (hypertension)   . Hypercholesteremia    hx; "brought it down w/diet" (05/07/2016)  . Hyperglycemia    a. A1c 6.1 in 2014.  . Inguinal hernia   . Left sided sciatica   . Normal cardiac stress test 06/2012  . Obesity   . OSA (obstructive sleep apnea)    a. Mild, did not tolerate CPAP (05/07/2016)  . PAF (paroxysmal atrial fibrillation) ([place])    a. s/p afib ablation at [facility] 2010 (Dr. Annabell Howells). b. Recurrent AF s/p DCCV 06/2010. c. On flecainide.   Marland Kitchen PONV (postoperative nausea and vomiting)   . PPD positive, treated 1977   "treated for 1 yr after exposure to patient"  . RSV infection ~ 2006  . Wandering (atrial) pacemaker    a. Remotely per notes.     Past Surgical History:  Procedure Laterality Date  . ATRIAL ABLATION SURGERY  05/07/2016   "fib and flutter"  . ATRIAL FIBRILLATION ABLATION  2010   at [facility]  [place]  . ATRIAL FIBRILLATION ABLATION N/A 05/07/2016   Procedure: Atrial Fibrillation Ablation;  Surgeon: Thompson Grayer, MD;  Location: [place] [facility];  Service: Cardiovascular;  Laterality: N/A;  . BREAST CYST ASPIRATION Bilateral   . CARDIAC CATHETERIZATION  2008  . CARPAL TUNNEL RELEASE Bilateral   . CESAREAN SECTION  1986; 1988  . COLONOSCOPY  X 2  . COLONOSCOPY W/ BIOPSIES AND POLYPECTOMY  X 2  . ESOPHAGOGASTRODUODENOSCOPY    . INGUINAL HERNIA REPAIR Right   . LAPAROSCOPIC CHOLECYSTECTOMY  1989  . TMJ ARTHROPLASTY    . TUBAL LIGATION  1988   Family History  Problem Relation Age of Onset  . Stroke Mother   . Heart attack Mother        2 previous MIs,  3 stents - CAD first diagnosed 47  . Thyroid disease Mother   . Colon polyps Father   . Lupus Sister   . Heart attack Paternal Grandfather        Died at 4 of massive MI  . Stroke Maternal Grandfather        Multiple family members  . Stroke Paternal Grandmother   . Thyroid disease Sister        x 2  . Colon cancer Neg Hx   . Stomach cancer Neg Hx    Social History   Tobacco Use  . Smoking status: Never Smoker  . Smokeless tobacco: Never Used  Substance Use Topics  . Alcohol use: Yes    Comment: 05/07/2016   . Drug use: No   Current Outpatient Medications  Medication Sig Dispense Refill  . acetaminophen (TYLENOL) 650 MG CR tablet Take 1,300 mg by mouth 2 (two) times daily.    . cetirizine (ZYRTEC) 10 MG tablet Take 10 mg by mouth daily.      . Cholecalciferol (VITAMIN D3) 1000 UNITS CAPS Take 2,000 Units by mouth daily.     Marland Kitchen dicyclomine (BENTYL) 10 MG capsule Take 1 tablet by mouth 30 min before meals and at bedtime as needed for pain, cramping. 120 capsule 1  . FLOVENT HFA 220 MCG/ACT inhaler Inhale 1 puff into the lungs 2 (two) times daily.  1  . fluticasone (FLONASE) 50 MCG/ACT nasal spray Place 1 spray into the nose 2 (two) times daily.     Marland Kitchen losartan (COZAAR) 25 MG tablet Take 1 tablet (25 mg total) by mouth daily. 90 tablet 0  . Multiple Vitamin (MULTIVITAMIN WITH MINERALS) TABS tablet Take 1 tablet by mouth daily.    Marland Kitchen omeprazole (PRILOSEC) 40 MG capsule Take 40 mg by mouth daily.     . polyethylene glycol (MIRALAX) packet Take 17 g by mouth daily as needed (for constipation.).    Marland Kitchen PROAIR HFA 108 (90 BASE) MCG/ACT inhaler Inhale 2 puffs into the lungs every 6 (six) hours as needed for wheezing or shortness of breath.     . venlafaxine XR (EFFEXOR-XR) 37.5 MG 24 hr capsule Take 37.5 mg by mouth daily with breakfast.     . verapamil (CALAN) 80 MG tablet Take 160 mg by mouth 2 (two) times daily.    Marland Kitchen warfarin (COUMADIN) 2 MG tablet TAKE AS DIRECTED BY COUMADIN CLINIC 225  tablet 0   Current Facility-Administered Medications  Medication Dose Route Frequency Provider Last Rate Last Dose  . triamcinolone acetonide (KENALOG) 10 MG/ML injection 10 mg  10 mg Other Once Harriet Masson, DPM      . triamcinolone acetonide (KENALOG) 10 MG/ML injection 10 mg  10 mg Other Once Harriet Masson, DPM       Allergies  Allergen Reactions  . Aspirin Other (See Comments)    Makes asthma worse, makes stomach hurt  . Ivp Dye [Iodinated Diagnostic Agents] Anaphylaxis    Required epinephrine. Since then, has tolerated with premed.  . Latex Other (See Comments)    Wheezing/cough   . Meperidine Hcl Other (See Comments)    Projectile vomiting   . Penicillins Anaphylaxis    Has patient had a PCN reaction causing immediate rash, facial/tongue/throat swelling, SOB or lightheadedness with hypotension:Yes Has patient had a PCN reaction causing severe rash involving mucus membranes or skin necrosis:Yes Has patient had a PCN reaction that required hospitalization:Treated in ER w/epi & breathing treatments Has patient had a PCN reaction occurring within the last 10 years:No If all of the above answers are "NO", then may proceed with Cephalosporin use.   . Statins Other (See Comments)    Severe leg pain - has tried most of them except the newest one  . Sulfonamide Derivatives Anaphylaxis  . Food     Nuts-mucus membranes inflamed/cough Bananas-mucus membranes inflamed/cough     Review of Systems: All systems reviewed and negative except where noted in HPI.   Lab Results  Component Value Date   WBC 5.7 11/24/2016   HGB 13.4 11/24/2016   HCT 40.5 11/24/2016   MCV 88.4 11/24/2016   PLT 417.0 (H) 11/24/2016    Lab Results  Component Value Date   CREATININE 0.73 10/07/2016   BUN 9 10/07/2016   NA 137 10/07/2016   K 3.7 10/07/2016   CL 104 10/07/2016   CO2 22 10/07/2016    Lab Results  Component Value Date   ALT 42 10/04/2016   AST 42 (H) 10/04/2016   ALKPHOS 83  10/04/2016   BILITOT 0.5 10/04/2016     Physical Exam: BP 126/80   Pulse 80   Ht 5\' 5"  (1.651 m)   Wt 236 lb (107 kg)   BMI 39.27 kg/m  Constitutional: Pleasant,female in no acute distress. HEENT: Normocephalic and atraumatic. Conjunctivae are normal. No scleral icterus. Neck supple.  Cardiovascular: Normal rate, regular rhythm.  Pulmonary/chest: Effort normal and breath sounds normal. No wheezing, rales or rhonchi. Abdominal: Soft, nondistended, obese abdomen,  RLQ TTP, There are no masses palpable.  Extremities: no edema Lymphadenopathy: No cervical adenopathy noted. Neurological: Alert and oriented to person place and time. Skin: Skin is warm and dry. No rashes noted. Psychiatric: Normal mood and affect. Behavior is normal.   ASSESSMENT AND PLAN: 61 year old female here for reassessment following issues:  Diverticulitis - new diagnosis of sigmoid diverticulitis in July, now with a few courses of antibiotics which provide improvement but do not resolve symptoms. She's had ongoing lower abdominal pain to right lower quadrant pain for the past several months which has not resolved, also with some altered bowel habits. At this time recommending a CT scan with contrast to evaluate for persistent smoldering diverticulitis. If this is positive she will need an extended course of antibiotics, if it is negative for diverticulitis, perhaps she has symptomatic uncomplicated diverticular disease. We discussed the difference between these 2 entities. I'm recommending a daily fiber supplement at this time while we await imaging, and she can continue Bentyl. I otherwise recommending a colonoscopy given this is her first episode of diverticulitis and her last colonoscopy was over 3 and half years ago. I will tentatively schedule this for January, however if she has active diverticulitis on  CT scan may need to postpone this a few more weeks. Follow discussion of risks and benefits of colonoscopy she  wanted to proceed. She will need to hold Coumadin for 5 days prior to colonoscopy , we will recheck to her prescribing provider to see if this is okay and if she warrants bridge. Of note, the patient has an anaphylactic allergy listed to IV contrast. I discussed this with the patient she reports this is only to an "old form" of IV contrast, and has tolerated IV contrast recently with only using Benadryl as premedication as a preventative measure. She was comfortable proceeding with IV contrast CT scan in this light.   Fatty liver - I counseled the patient on fatty liver disease, risks for Karlene Lineman and cirrhosis. Her liver enzymes was recently removed we elevated although she has been trying to lose weight. Recommending we repeat LFTs now, if she has persistent elevation recent some serologies to ensure no other process contributing to this. She will continue to work on weight loss.  Carlos Cellar, MD [facility] Gastroenterology Pager [phone]

## 2017-02-26 ENCOUNTER — Inpatient Hospital Stay: Admission: RE | Admit: 2017-02-26 | Payer: 59 | Source: Ambulatory Visit

## 2017-02-27 NOTE — Telephone Encounter (Signed)
   Chart reviewed as part of pre-operative protocol coverage. Pre-op anticoag recommendation summarized by pharmacist.  Will route this bundled recommendation to requesting party via Epic as requested. Please call office to  coordinate bridging with Coumadin clinic in advance of scheduled procedure date.  Charlie Pitter, PA-C 02/27/2017, 3:04 PM

## 2017-03-03 ENCOUNTER — Telehealth: Payer: Self-pay

## 2017-03-03 NOTE — Telephone Encounter (Signed)
See Telephone call from 02-25-17 regarding anti coag clearance. Called and spoke to pt.  Instructed her to hold Coumadin 5 days prior to procedure starting on 04-01-17 per Dr. Jackalyn Lombard office.  She has an appt with the Coumadin clinic next week on 03-09-17 and knows to discuss doing a lovenox bridge prior to the colonoscopy when holding her coumadin.  She expressed complete understanding.

## 2017-03-04 ENCOUNTER — Ambulatory Visit (HOSPITAL_COMMUNITY)
Admission: RE | Admit: 2017-03-04 | Discharge: 2017-03-04 | Disposition: A | Discharge status: Home / Self Care | Payer: 59 | Source: Ambulatory Visit | Attending: Gastroenterology | Admitting: Gastroenterology

## 2017-03-04 DIAGNOSIS — K5732 Diverticulitis of large intestine without perforation or abscess without bleeding: Secondary | ICD-10-CM | POA: Insufficient documentation

## 2017-03-04 DIAGNOSIS — N200 Calculus of kidney: Secondary | ICD-10-CM | POA: Insufficient documentation

## 2017-03-04 DIAGNOSIS — Z9049 Acquired absence of other specified parts of digestive tract: Secondary | ICD-10-CM | POA: Insufficient documentation

## 2017-03-04 DIAGNOSIS — K5730 Diverticulosis of large intestine without perforation or abscess without bleeding: Secondary | ICD-10-CM | POA: Insufficient documentation

## 2017-03-04 DIAGNOSIS — K760 Fatty (change of) liver, not elsewhere classified: Secondary | ICD-10-CM | POA: Insufficient documentation

## 2017-03-04 MED ORDER — IOPAMIDOL (ISOVUE-300) INJECTION 61%
100.0000 mL | Freq: Once | INTRAVENOUS | Status: AC | PRN
  Administered 2017-03-04: 100 mL via INTRAVENOUS

## 2017-03-04 MED ORDER — IOPAMIDOL (ISOVUE-300) INJECTION 61%
INTRAVENOUS | Status: AC
  Filled 2017-03-04: qty 100

## 2017-03-09 ENCOUNTER — Ambulatory Visit (INDEPENDENT_AMBULATORY_CARE_PROVIDER_SITE_OTHER): Payer: 59 | Admitting: *Deleted

## 2017-03-09 DIAGNOSIS — Z5181 Encounter for therapeutic drug level monitoring: Secondary | ICD-10-CM | POA: Diagnosis not present

## 2017-03-09 DIAGNOSIS — I4891 Unspecified atrial fibrillation: Secondary | ICD-10-CM

## 2017-03-09 DIAGNOSIS — I480 Paroxysmal atrial fibrillation: Secondary | ICD-10-CM | POA: Diagnosis not present

## 2017-03-09 LAB — POCT INR: INR: 2.8

## 2017-03-09 NOTE — Patient Instructions (Signed)
Description   Continue taking coumadin 6mg  daily except 4mg  on Mondays Wednesdays and Fridays Recheck INR in 3 weeks prior to Colonoscopy on 04/06/17.

## 2017-03-30 ENCOUNTER — Ambulatory Visit (INDEPENDENT_AMBULATORY_CARE_PROVIDER_SITE_OTHER): Payer: 59 | Admitting: *Deleted

## 2017-03-30 DIAGNOSIS — I480 Paroxysmal atrial fibrillation: Secondary | ICD-10-CM

## 2017-03-30 DIAGNOSIS — Z5181 Encounter for therapeutic drug level monitoring: Secondary | ICD-10-CM | POA: Diagnosis not present

## 2017-03-30 DIAGNOSIS — I4891 Unspecified atrial fibrillation: Secondary | ICD-10-CM

## 2017-03-30 LAB — POCT INR: INR: 2.7

## 2017-03-30 MED ORDER — ENOXAPARIN SODIUM 100 MG/ML ~~LOC~~ SOLN: 100.0000 mg | Freq: Two times a day (BID) | SUBCUTANEOUS | 1 refills | Status: DC

## 2017-03-30 NOTE — Patient Instructions (Addendum)
03/31/17: Last dose of Coumadin.  04/01/17: No Coumadin or Lovenox.  04/02/17: Inject Lovenox 100mg  in the fatty abdominal tissue at least 2 inches from the belly button twice a day about 12 hours apart, 8am and 8pm rotate sites. No Coumadin.  04/03/17: Inject Lovenox in the fatty tissue every 12 hours, 8am and 8pm. No Coumadin.  04/04/17: Inject Lovenox in the fatty tissue every 12 hours, 8am and 8pm. No Coumadin.  04/05/17: Inject Lovenox in the fatty tissue in the morning at 8 am (No PM dose). No Coumadin.  04/06/17: Procedure Day - No Lovenox - Resume Coumadin in the evening or as directed by doctor (take an extra half tablet with usual dose for 2 days then resume normal dose).  04/07/17: Resume Lovenox inject in the fatty tissue every 12 hours and take Coumadin.  04/08/17: Inject Lovenox in the fatty tissue every 12 hours and take Coumadin.  04/09/17: Inject Lovenox in the fatty tissue every 12 hours and take Coumadin.  04/10/17: Inject Lovenox in the fatty tissue every 12 hours and take Coumadin.  04/11/17: Inject Lovenox in the fatty tissue every 12 hours and take Coumadin.  04/12/17: Coumadin appt to check INR.

## 2017-04-06 ENCOUNTER — Ambulatory Visit (AMBULATORY_SURGERY_CENTER): Payer: 59 | Admitting: Gastroenterology

## 2017-04-06 ENCOUNTER — Encounter: Payer: Self-pay | Admitting: Gastroenterology

## 2017-04-06 VITALS — BP 149/71 | HR 66 | Temp 96.6°F | Resp 18 | Ht 65.0 in | Wt 236.0 lb

## 2017-04-06 DIAGNOSIS — D123 Benign neoplasm of transverse colon: Secondary | ICD-10-CM | POA: Diagnosis not present

## 2017-04-06 DIAGNOSIS — D125 Benign neoplasm of sigmoid colon: Secondary | ICD-10-CM | POA: Diagnosis not present

## 2017-04-06 DIAGNOSIS — K5732 Diverticulitis of large intestine without perforation or abscess without bleeding: Secondary | ICD-10-CM | POA: Diagnosis present

## 2017-04-06 MED ORDER — SODIUM CHLORIDE 0.9 % IV SOLN: 500.0000 mL | Freq: Once | INTRAVENOUS | Status: DC

## 2017-04-06 NOTE — Op Note (Signed)
[facility] Patient Name: [name] Procedure Date: 04/06/2017 2:05 PM MRN: [id number] Endoscopist: Remo Lipps P. Armbruster MD, MD Age: 62 Referring MD:  Date of Birth: 1955/06/14 Gender: Female Account #: [id number] Procedure:                Colonoscopy Indications:              Follow-up of sigmoid diverticulitis Medicines:                Monitored Anesthesia Care Procedure:                Pre-Anesthesia Assessment:                           - Prior to the procedure, a History and Physical                            was performed, and patient medications and                            allergies were reviewed. The patient's tolerance of                            previous anesthesia was also reviewed. The risks                            and benefits of the procedure and the sedation                            options and risks were discussed with the patient.                            All questions were answered, and informed consent                            was obtained. Prior Anticoagulants: The patient has                            taken Lovenox (enoxaparin), last dose was 1 day                            prior to procedure. ASA Grade Assessment: III - A                            patient with severe systemic disease. After                            reviewing the risks and benefits, the patient was                            deemed in satisfactory condition to undergo the                            procedure.  After obtaining informed consent, the colonoscope                            was passed under direct vision. Throughout the                            procedure, the patient's blood pressure, pulse, and                            oxygen saturations were monitored continuously. The                            Colonoscope was introduced through the anus and                            advanced to the the cecum, identified by                   appendiceal orifice and ileocecal valve. The                            colonoscopy was performed without difficulty. The                            patient tolerated the procedure well. The quality                            of the bowel preparation was good. The ileocecal                            valve, appendiceal orifice, and rectum were                            photographed. Scope In: 2:11:39 PM Scope Out: 2:36:33 PM Scope Withdrawal Time: 0 hours 19 minutes 34 seconds  Total Procedure Duration: 0 hours 24 minutes 54 seconds  Findings:                 The perianal and digital rectal examinations were                            normal.                           Two sessile polyps were found in the transverse                            colon. The polyps were 4 to 10 mm in size. These                            polyps were removed with a cold snare. Resection                            and retrieval were complete.  Three sessile polyps were found in the transverse                            colon. The polyps were 3 mm in size. These polyps                            were removed with a cold biopsy forceps. Resection                            and retrieval were complete.                           A 3 mm polyp was found in the sigmoid colon. The                            polyp was sessile. The polyp was removed with a                            cold biopsy forceps. Resection and retrieval were                            complete.                           Multiple medium-mouthed diverticula were found in                            the left colon.                           Internal hemorrhoids were found during retroflexion.                           The exam was otherwise without abnormality. Complications:            No immediate complications. Estimated blood loss:                            Minimal. Estimated Blood Loss:     Estimated  blood loss was minimal. Impression:               - Two 4 to 8 mm polyps in the transverse colon,                            removed with a cold snare. Resected and retrieved.                           - Three 3 mm polyps in the transverse colon,                            removed with a cold biopsy forceps. Resected and                            retrieved.                           -  One 3 mm polyp in the sigmoid colon, removed with                            a cold biopsy forceps. Resected and retrieved.                           - Diverticulosis in the left colon.                           - Internal hemorrhoids.                           - The examination was otherwise normal.                           Overall, diverticulosis in sigmoid colon correlates                            well with CT findings of sigmoid diverticulitis, no                            other pathology present. Recommendation:           - Patient has a contact number available for                            emergencies. The signs and symptoms of potential                            delayed complications were discussed with the                            patient. Return to normal activities tomorrow.                            Written discharge instructions were provided to the                            patient.                           - Resume previous diet.                           - Continue present medications.                           - Resume Coumadin today                           - Resume Lovenox tomorrow                           - Await pathology results.                           - Repeat colonoscopy is recommended for  surveillance. The colonoscopy date will be                            determined after pathology results from today's                            exam become available for review. Remo Lipps P. Armbruster MD, MD 04/06/2017 2:43:55 PM This report has been  signed electronically.

## 2017-04-06 NOTE — Progress Notes (Signed)
Called to room to assist during endoscopic procedure.  Patient ID and intended procedure confirmed with present staff. Received instructions for my participation in the procedure from the performing physician.  

## 2017-04-06 NOTE — Progress Notes (Signed)
No problems noted in the recovery room. maw 

## 2017-04-06 NOTE — Patient Instructions (Signed)
YOU HAD AN ENDOSCOPIC PROCEDURE TODAY AT [facility]:   Refer to the procedure report that was given to you for any specific questions about what was found during the examination.  If the procedure report does not answer your questions, please call your gastroenterologist to clarify.  If you requested that your care partner not be given the details of your procedure findings, then the procedure report has been included in a sealed envelope for you to review at your convenience later.  YOU SHOULD EXPECT: Some feelings of bloating in the abdomen. Passage of more gas than usual.  Walking can help get rid of the air that was put into your GI tract during the procedure and reduce the bloating. If you had a lower endoscopy (such as a colonoscopy or flexible sigmoidoscopy) you may notice spotting of blood in your stool or on the toilet paper. If you underwent a bowel prep for your procedure, you may not have a normal bowel movement for a few days.  Please Note:  You might notice some irritation and congestion in your nose or some drainage.  This is from the oxygen used during your procedure.  There is no need for concern and it should clear up in a day or so.  SYMPTOMS TO REPORT IMMEDIATELY:   Following lower endoscopy (colonoscopy or flexible sigmoidoscopy):  Excessive amounts of blood in the stool  Significant tenderness or worsening of abdominal pains  Swelling of the abdomen that is new, acute  Fever of 100F or higher   For urgent or emergent issues, a gastroenterologist can be reached at any hour by calling [phone].   DIET:  We do recommend a small meal at first, but then you may proceed to your regular diet.  Drink plenty of fluids but you should avoid alcoholic beverages for 24 hours.  ACTIVITY:  You should plan to take it easy for the rest of today and you should NOT DRIVE or use heavy machinery until tomorrow (because of the sedation medicines used during the test).     FOLLOW UP: Our staff will call the number listed on your records the next business day following your procedure to check on you and address any questions or concerns that you may have regarding the information given to you following your procedure. If we do not reach you, we will leave a message.  However, if you are feeling well and you are not experiencing any problems, there is no need to return our call.  We will assume that you have returned to your regular daily activities without incident.  If any biopsies were taken you will be contacted by phone or by letter within the next 1-3 weeks.  Please call us at [phone] if you have not heard about the biopsies in 3 weeks.    SIGNATURES/CONFIDENTIALITY: You and/or your care partner have signed paperwork which will be entered into your electronic medical record.  These signatures attest to the fact that that the information above on your After Visit Summary has been reviewed and is understood.  Full responsibility of the confidentiality of this discharge information lies with you and/or your care-partner.   Handouts were given to your care partner on polyps, diverticulosis, and hemorrhoids. Per Dr. Havery Moros resume coumadin tonight.  Resume your Lovenox tomorrow afternoon, and follow cardiologist's orders on how long to take lovenox. You may resume your other current medications today. Await biopsy results. Please call if any questions or concerns.

## 2017-04-06 NOTE — Progress Notes (Signed)
Pt's states no medical or surgical changes since previsit or office visit. 

## 2017-04-06 NOTE — Progress Notes (Signed)
Spontaneous respirations throughout. VSS. Resting comfortably. To PACU on room air. Report to  RN. 

## 2017-04-07 ENCOUNTER — Telehealth: Payer: Self-pay

## 2017-04-07 NOTE — Telephone Encounter (Signed)
  Follow up Call-  Call back number 04/06/2017  Post procedure Call Back phone  # [phone]  Permission to leave phone message Yes  Some recent data might be hidden     Patient questions:  Do you have a fever, pain , or abdominal swelling? No. Pain Score  0 *  Have you tolerated food without any problems? Yes.    Have you been able to return to your normal activities? Yes.    Do you have any questions about your discharge instructions: Diet   No. Medications  No. Follow up visit  No.  Do you have questions or concerns about your Care? No.  Actions: * If pain score is 4 or above: No action needed, pain <4.  No problems noted per pt. maw

## 2017-04-08 ENCOUNTER — Telehealth: Payer: Self-pay | Admitting: Gastroenterology

## 2017-04-08 NOTE — Telephone Encounter (Signed)
Patient states that she started having intermittent lower abdominal pain, she took a Bentyl last night and it did help relieve some of the pain. She had a bm this morning, reports it was normal, no blood. She denies any fever, n/v, no abdominal distention. Suggested she take another Bentyl now to see if she can't get some relief and I would let Dr. Havery Moros know.

## 2017-04-08 NOTE — Telephone Encounter (Signed)
Thanks for the update.  I would have her continue Bentyl as needed and monitor if she is feeling better. If she has worsening pain or symptoms consistent with prior diverticulitis moving forward, she should contact us again. Thanks

## 2017-04-08 NOTE — Telephone Encounter (Signed)
Spoke to patient let her know to continue the Bentyl and monitor symptoms, if worsens she needs to let us know.

## 2017-04-12 ENCOUNTER — Ambulatory Visit (INDEPENDENT_AMBULATORY_CARE_PROVIDER_SITE_OTHER): Payer: 59 | Admitting: *Deleted

## 2017-04-12 DIAGNOSIS — I480 Paroxysmal atrial fibrillation: Secondary | ICD-10-CM

## 2017-04-12 DIAGNOSIS — Z5181 Encounter for therapeutic drug level monitoring: Secondary | ICD-10-CM

## 2017-04-12 DIAGNOSIS — I4891 Unspecified atrial fibrillation: Secondary | ICD-10-CM | POA: Diagnosis not present

## 2017-04-12 LAB — POCT INR: INR: 2.1

## 2017-04-12 NOTE — Patient Instructions (Signed)
Description   Stop Lovenox injections.  Continue taking coumadin 6mg  daily except 4mg  on Mondays, Wednesdays, and Fridays.  Recheck INR in 3 weeks.  [facility] [phone] Main # [phone]

## 2017-04-13 ENCOUNTER — Encounter: Payer: Self-pay | Admitting: Gastroenterology

## 2017-04-14 ENCOUNTER — Other Ambulatory Visit: Payer: Self-pay | Admitting: *Deleted

## 2017-04-14 MED ORDER — WARFARIN SODIUM 2 MG PO TABS: ORAL_TABLET | ORAL | 1 refills | Status: DC

## 2017-05-03 ENCOUNTER — Ambulatory Visit (INDEPENDENT_AMBULATORY_CARE_PROVIDER_SITE_OTHER): Payer: 59

## 2017-05-03 DIAGNOSIS — Z5181 Encounter for therapeutic drug level monitoring: Secondary | ICD-10-CM

## 2017-05-03 DIAGNOSIS — I480 Paroxysmal atrial fibrillation: Secondary | ICD-10-CM | POA: Diagnosis not present

## 2017-05-03 DIAGNOSIS — I4891 Unspecified atrial fibrillation: Secondary | ICD-10-CM

## 2017-05-03 LAB — POCT INR: INR: 2.9

## 2017-05-03 NOTE — Patient Instructions (Signed)
Description   Continue on same dosage 6mg  daily except 4mg  on Mondays, Wednesdays, and Fridays.  Recheck INR in 4 weeks.  [facility] [phone] Main # [phone]

## 2017-05-17 DIAGNOSIS — J069 Acute upper respiratory infection, unspecified: Secondary | ICD-10-CM | POA: Diagnosis not present

## 2017-05-17 DIAGNOSIS — K5792 Diverticulitis of intestine, part unspecified, without perforation or abscess without bleeding: Secondary | ICD-10-CM | POA: Diagnosis not present

## 2017-05-17 DIAGNOSIS — J45909 Unspecified asthma, uncomplicated: Secondary | ICD-10-CM | POA: Diagnosis not present

## 2017-05-18 ENCOUNTER — Telehealth: Payer: Self-pay | Admitting: Gastroenterology

## 2017-05-18 NOTE — Telephone Encounter (Signed)
Okay thanks for letting me know

## 2017-05-18 NOTE — Telephone Encounter (Signed)
Patient has had recent URI and had seen her PCP. Sunday spiked a fever of 102 and had LLQ pain, watery diarrhea. She saw PCP yesterday and he felt she was having a diverticulitis flare and started her on Cipro and Flagyl x 10 days. She did have a formed stool today, did have LLQ pain with the bm but feels better now. She does have a follow up visit on 3/12. She just wanted to let you know about antibiotics. I did tell her to contact office if symptoms worsen or do not get better.

## 2017-05-28 DIAGNOSIS — G5603 Carpal tunnel syndrome, bilateral upper limbs: Secondary | ICD-10-CM | POA: Diagnosis not present

## 2017-05-28 DIAGNOSIS — M65332 Trigger finger, left middle finger: Secondary | ICD-10-CM | POA: Diagnosis not present

## 2017-05-28 DIAGNOSIS — M65322 Trigger finger, left index finger: Secondary | ICD-10-CM | POA: Diagnosis not present

## 2017-05-31 ENCOUNTER — Ambulatory Visit (INDEPENDENT_AMBULATORY_CARE_PROVIDER_SITE_OTHER): Payer: 59 | Admitting: *Deleted

## 2017-05-31 DIAGNOSIS — Z5181 Encounter for therapeutic drug level monitoring: Secondary | ICD-10-CM | POA: Diagnosis not present

## 2017-05-31 DIAGNOSIS — I480 Paroxysmal atrial fibrillation: Secondary | ICD-10-CM | POA: Diagnosis not present

## 2017-05-31 DIAGNOSIS — I4891 Unspecified atrial fibrillation: Secondary | ICD-10-CM

## 2017-05-31 LAB — POCT INR: INR: 3

## 2017-05-31 NOTE — Patient Instructions (Signed)
Description   Continue on same dosage 6mg  daily except 4mg  on Mondays, Wednesdays, and Fridays.  Recheck INR in 4 weeks.  [facility] [phone] Main # [phone]

## 2017-06-01 ENCOUNTER — Telehealth: Payer: Self-pay

## 2017-06-01 ENCOUNTER — Ambulatory Visit (INDEPENDENT_AMBULATORY_CARE_PROVIDER_SITE_OTHER): Payer: 59 | Admitting: Gastroenterology

## 2017-06-01 ENCOUNTER — Encounter: Payer: Self-pay | Admitting: Gastroenterology

## 2017-06-01 VITALS — BP 134/80 | HR 92 | Ht 65.0 in | Wt 231.6 lb

## 2017-06-01 DIAGNOSIS — K760 Fatty (change of) liver, not elsewhere classified: Secondary | ICD-10-CM | POA: Diagnosis not present

## 2017-06-01 DIAGNOSIS — K5732 Diverticulitis of large intestine without perforation or abscess without bleeding: Secondary | ICD-10-CM | POA: Diagnosis not present

## 2017-06-01 DIAGNOSIS — R1310 Dysphagia, unspecified: Secondary | ICD-10-CM

## 2017-06-01 DIAGNOSIS — K219 Gastro-esophageal reflux disease without esophagitis: Secondary | ICD-10-CM | POA: Diagnosis not present

## 2017-06-01 MED ORDER — OMEPRAZOLE 40 MG PO CPDR: 40.0000 mg | DELAYED_RELEASE_CAPSULE | Freq: Two times a day (BID) | ORAL | 3 refills | Status: DC

## 2017-06-01 NOTE — Progress Notes (Signed)
HPI :   62 year old female here for reassessment.  She had her first episode of diverticulitis in July 2018 when she presented with lower abdominal pain and fever.  CT scan at the time showed sigmoid diverticulitis.  She was treated with Cipro and Flagyl with improvement of symptoms.  Again in September she presented with similar symptoms and was given antibiotics again which improved her symptoms.  She again had symptoms in December although states she had no fever at the time.  Repeat CT scan showed diverticulosis but no diverticulitis.  Also incidentally noted was fatty liver.  She had a colonoscopy in January 15 of this year showing diverticulosis in the left colon with multiple polyps removed, 3 of which were adenomatous.  She also had internal hemorrhoids.  She unfortunately had another recurrence of symptoms with left lower quadrant pain and fever a few weeks ago.  She states she felt like she had diverticulitis again.  We again treated her with antibiotics and her symptoms slowly improved over 4 days.  She recently completed another course of antibiotics and states she is mostly back to normal, only some mild left upper quadrant pain.  She states there is no blood in her stools her bowels are otherwise regular.  She also complains of reflux which is been bothering her recently.  She endorses pyrosis and regurgitation, as well as cough which she thinks is related to her reflux.  She is taking Prilosec 40 mg once a day and has been having frequent breakthrough symptoms.  She had daily symptoms over the past week, along with nocturnal symptoms which wakes her up.  She has tried twice daily dosing which she states does help.  She does have occasional dysphagia to both solids and liquids which comes and goes and intermittent.  She states she had an EGD over 20 years ago and does not know the result.  She does have a history of Coumadin use for history of DVT and atrial fibrillation.  She has had  ablation therapies for atrial fibrillation.  Last echocardiogram shows EF of 65-70%.  Colonoscopy 04/06/2017 - transverse polyps x 2 cold snare, transverse polyps x 3 cold forceps, sigmoid polyp - path shows 3 adenomas total, diverticulosis in left colon, internal hemorrhoids, recall colonoscopy 3 years    Past Medical History:  Diagnosis Date  . Allergic rhinitis   . Arthritis    "back" (05/07/2016)  . Asthma   . Atrial flutter ([place])    a. Remotely per notes.  . Carpal tunnel syndrome, bilateral   . Colon polyps   . Diverticulitis   . DM (diabetes mellitus) ([place])   . DVT (deep venous thrombosis) ([place]) 1980s x2 -    between birth of two sons. Saw hematology - was told she has a hypercoagulable disorder, should be on Coumadin lifelong.  . Family history of adverse reaction to anesthesia    "mother gets PONV"  . Gallbladder disease   . GERD (gastroesophageal reflux disease)   . Halothane adverse reaction    narrow small opening  . Heart murmur   . History of hiatal hernia    "small one/CTA 05/01/2016" (05/07/2016)  . HTN (hypertension)   . Hypercholesteremia    hx; "brought it down w/diet" (05/07/2016)  . Hyperglycemia    a. A1c 6.1 in 2014.  . Inguinal hernia   . Left sided sciatica   . Normal cardiac stress test 06/2012  . Obesity   . OSA (obstructive sleep apnea)  a. Mild, did not tolerate CPAP (05/07/2016)  . PAF (paroxysmal atrial fibrillation) ([place])    a. s/p afib ablation at [facility] 2010 (Dr. Annabell Howells). b. Recurrent AF s/p DCCV 06/2010. c. On flecainide.   Marland Kitchen PONV (postoperative nausea and vomiting)   . PPD positive, treated 1977   "treated for 1 yr after exposure to patient"  . RSV infection ~ 2006  . Wandering (atrial) pacemaker    a. Remotely per notes.     Past Surgical History:  Procedure Laterality Date  . ATRIAL ABLATION SURGERY  05/07/2016   "fib and flutter"  . ATRIAL FIBRILLATION ABLATION  2010   at [facility]  . ATRIAL FIBRILLATION ABLATION N/A  05/07/2016   Procedure: Atrial Fibrillation Ablation;  Surgeon: Thompson Grayer, MD;  Location: [facility];  Service: Cardiovascular;  Laterality: N/A;  . BREAST CYST ASPIRATION Bilateral   . CARDIAC CATHETERIZATION  2008  . CARPAL TUNNEL RELEASE Bilateral   . CESAREAN SECTION  1986; 1988  . COLONOSCOPY  X 2  . COLONOSCOPY W/ BIOPSIES AND POLYPECTOMY  X 2  . ESOPHAGOGASTRODUODENOSCOPY    . INGUINAL HERNIA REPAIR Right   . LAPAROSCOPIC CHOLECYSTECTOMY  1989  . TMJ ARTHROPLASTY    . TUBAL LIGATION  1988   Family History  Problem Relation Age of Onset  . Stroke Mother   . Heart attack Mother        2 previous MIs, 3 stents - CAD first diagnosed 64  . Thyroid disease Mother   . Colon polyps Father   . Lupus Sister   . Heart attack Paternal Grandfather        Died at 89 of massive MI  . Stroke Maternal Grandfather        Multiple family members  . Stroke Paternal Grandmother   . Thyroid disease Sister        x 2  . Colon cancer Neg Hx   . Stomach cancer Neg Hx    Social History   Tobacco Use  . Smoking status: Never Smoker  . Smokeless tobacco: Never Used  Substance Use Topics  . Alcohol use: Yes    Comment: 05/07/2016   . Drug use: No   Current Outpatient Medications  Medication Sig Dispense Refill  . acetaminophen (TYLENOL) 650 MG CR tablet Take 1,300 mg by mouth 2 (two) times daily.    . cetirizine (ZYRTEC) 10 MG tablet Take 10 mg by mouth daily.      . Cholecalciferol (VITAMIN D3) 1000 UNITS CAPS Take 2,000 Units by mouth daily.     Marland Kitchen dicyclomine (BENTYL) 10 MG capsule Take 1 tablet by mouth 30 min before meals and at bedtime as needed for pain, cramping. 120 capsule 1  . enoxaparin (LOVENOX) 100 MG/ML injection Inject 1 mL (100 mg total) into the skin every 12 (twelve) hours. 20 Syringe 1  . FLOVENT HFA 220 MCG/ACT inhaler Inhale 1 puff into the lungs 2 (two) times daily.  1  . fluticasone (FLONASE) 50 MCG/ACT nasal spray Place 1 spray into the nose 2 (two) times  daily.     Marland Kitchen losartan (COZAAR) 25 MG tablet Take 1 tablet (25 mg total) by mouth daily. 90 tablet 0  . Multiple Vitamin (MULTIVITAMIN WITH MINERALS) TABS tablet Take 1 tablet by mouth daily.    Marland Kitchen omeprazole (PRILOSEC) 40 MG capsule Take 40 mg by mouth daily.     . polyethylene glycol (MIRALAX) packet Take 17 g by mouth daily as needed (  for constipation.).    Marland Kitchen PROAIR HFA 108 (90 BASE) MCG/ACT inhaler Inhale 2 puffs into the lungs every 6 (six) hours as needed for wheezing or shortness of breath.     . venlafaxine XR (EFFEXOR-XR) 37.5 MG 24 hr capsule Take 37.5 mg by mouth daily with breakfast.     . verapamil (CALAN) 80 MG tablet Take 160 mg by mouth 2 (two) times daily.    Marland Kitchen warfarin (COUMADIN) 2 MG tablet TAKE AS DIRECTED BY COUMADIN CLINIC 225 tablet 1   Current Facility-Administered Medications  Medication Dose Route Frequency Provider Last Rate Last Dose  . 0.9 %  sodium chloride infusion  500 mL Intravenous Once Armbruster, Carlota Raspberry, MD      . triamcinolone acetonide (KENALOG) 10 MG/ML injection 10 mg  10 mg Other Once Harriet Masson, DPM      . triamcinolone acetonide (KENALOG) 10 MG/ML injection 10 mg  10 mg Other Once Harriet Masson, DPM       Allergies  Allergen Reactions  . Aspirin Other (See Comments)    Makes asthma worse, makes stomach hurt  . Ivp Dye [Iodinated Diagnostic Agents] Anaphylaxis    Required epinephrine. Since then, has tolerated with premed.  . Latex Other (See Comments)    Wheezing/cough   . Meperidine Hcl Other (See Comments)    Projectile vomiting   . Penicillins Anaphylaxis    Has patient had a PCN reaction causing immediate rash, facial/tongue/throat swelling, SOB or lightheadedness with hypotension:Yes Has patient had a PCN reaction causing severe rash involving mucus membranes or skin necrosis:Yes Has patient had a PCN reaction that required hospitalization:Treated in ER w/epi & breathing treatments Has patient had a PCN reaction occurring within  the last 10 years:No If all of the above answers are "NO", then may proceed with Cephalosporin use.   . Statins Other (See Comments)    Severe leg pain - has tried most of them except the newest one  . Sulfonamide Derivatives Anaphylaxis  . Food     Nuts-mucus membranes inflamed/cough Bananas-mucus membranes inflamed/cough     Review of Systems: All systems reviewed and negative except where noted in HPI.   Lab Results  Component Value Date   ALT 21 02/25/2017   AST 19 02/25/2017   ALKPHOS 90 02/25/2017   BILITOT 0.3 02/25/2017    Lab Results  Component Value Date   CREATININE 0.64 02/25/2017   BUN 10 02/25/2017   NA 138 02/25/2017   K 3.5 02/25/2017   CL 99 02/25/2017   CO2 30 02/25/2017    Lab Results  Component Value Date   ALT 21 02/25/2017   AST 19 02/25/2017   ALKPHOS 90 02/25/2017   BILITOT 0.3 02/25/2017   Lab Results  Component Value Date   WBC 5.7 11/24/2016   HGB 13.4 11/24/2016   HCT 40.5 11/24/2016   MCV 88.4 11/24/2016   PLT 417.0 (H) 11/24/2016      Physical Exam: BP 134/80 (Cuff Size: Large)   Pulse 92   Ht 5\' 5"  (1.651 m)   Wt 231 lb 9.6 oz (105.1 kg)   BMI 38.54 kg/m  Constitutional: Pleasant,well-developed, female in no acute distress. HEENT: Normocephalic and atraumatic. Conjunctivae are normal. No scleral icterus. Neck supple.  Cardiovascular: Normal rate, regular rhythm.  Pulmonary/chest: Effort normal and breath sounds normal. No wheezing, rales or rhonchi. Abdominal: Soft, protuberant, mild LLQ TTP , There are no masses palpable. No hepatomegaly. Extremities: no edema Lymphadenopathy: No cervical adenopathy noted. Neurological:  Alert and oriented to person place and time. Skin: Skin is warm and dry. No rashes noted. Psychiatric: Normal mood and affect. Behavior is normal.   ASSESSMENT AND PLAN: 62 year old female here for reassessment of following issues:  Diverticulitis - left-sided diverticulosis noted on colonoscopy,  she has prior CT imaging consistent with diverticulitis in this area.  She has had at least 4 episodes since last summer of left lower quadrant pain concerning for diverticulitis, 3 of these episodes associated with fever.  She has responded appropriately to antibiotics.  I think she likely has had recurrent diverticulitis versus symptomatic diverticulosis.  We discussed options moving forward.  Given her multiple episodes of this within the past year she is requesting a surgical consultation.  I discussed with this would entail for her, she wants to proceed with an evaluation in at least 2 more from the surgeon to discuss with the surgery entails.  In the interim recommend MiraLAX daily to keep stools loose.  GERD / dysphagia - recent worsening of reflux symptoms despite omeprazole once a day.  Recommend she increase her dose to 40 mg twice daily and will further evaluate her symptoms with an EGD potentially dilate pending findings.  She will need to hold her Coumadin 5 days before the procedure and has required Lovenox bridge in the past.  We will discuss this with her prescribing physician.  She agreed with the plan.  Fatty liver - noted incidentally on imaging previously, her recent LFTs are normal.  She will continue to work on weight loss, minimize alcohol use.  Monte Sereno Cellar, MD [facility] Pager [phone]

## 2017-06-01 NOTE — Patient Instructions (Addendum)
You have been scheduled for an endoscopy. Please follow written instructions given to you at your visit today. If you use inhalers (even only as needed), please bring them with you on the day of your procedure. Your physician has requested that you go to www.startemmi.com and enter the access code given to you at your visit today. This web site gives a general overview about your procedure. However, you should still follow specific instructions given to you by our office regarding your preparation for the procedure.  You will be contacted by our office prior to your procedure for directions on holding your Coumadin.  If you do not hear from our office 1 week prior to your scheduled procedure, please call [phone] to discuss.    If you are age 20 or older, your body mass index should be between 23-30. Your Body mass index is 38.54 kg/m. If this is out of the aforementioned range listed, please consider follow up with your Primary Care Provider.  If you are age 73 or younger, your body mass index should be between 19-25. Your Body mass index is 38.54 kg/m. If this is out of the aformentioned range listed, please consider follow up with your Primary Care Provider.   We have sent the following medications to your pharmacy for you to pick up at your convenience: Omeprazole 40mg , twice a day  Please start taking Miralax as directed, daily.  We will refer you to [facility].  They will reach out to you to schedule an appointment.  Please let us know if you have not heard from them within a week or two.   Thank you for entrusting me with your care and for choosing [facility], Dr. [place] Cellar

## 2017-06-01 NOTE — Telephone Encounter (Signed)
   [name] [facility] [id number] [phone]  Dear Dr. Rayann Heman:  We have scheduled the above named patient for a(n) endoscopy procedure. Our records show that (s)he is on anticoagulation therapy.  Please advise as to whether the patient may come off their therapy of Coumadin 5 days prior to their procedure which is scheduled for 06-15-17.  Please route your response to Lemar Lofty or fax response to [phone].  Sincerely,    [facility]

## 2017-06-03 ENCOUNTER — Telehealth: Payer: Self-pay

## 2017-06-03 NOTE — Telephone Encounter (Signed)
Called pt and left message regarding holding Coumadin for 5 days prior to her procedure.  Asked her to call me back to confirm understanding and to verify she has or will contact cardiology regarding the Lovenox bridge.

## 2017-06-03 NOTE — Telephone Encounter (Signed)
Pt has appt with [facility], Dr. Johney Maine, on 06-14-17. Pt has been notified.

## 2017-06-03 NOTE — Telephone Encounter (Signed)
Patient with diagnosis of atrial fibrillation/prior DVT on warfarin for anticoagulation.    Procedure: endoscopy Date of procedure: 06/15/17  CHADS2-VASc score of  4 (HTN, stroke/tia equivalent (prior DVT, hypercoagulable state) x 2, female)  CrCl  Platelet count 417  Per office protocol, patient can hold warfarin for 5 days prior to procedure.   Patient wil need bridging with Lovenox (enoxaparin) around procedure.  Her warfarin is managed by our [employer], we will arrange bridging for before and after procedure

## 2017-06-03 NOTE — Telephone Encounter (Signed)
Patient calling to let Jan know that she got her message and has already been in contact with the Coumadin clinic.

## 2017-06-07 ENCOUNTER — Ambulatory Visit (INDEPENDENT_AMBULATORY_CARE_PROVIDER_SITE_OTHER): Payer: 59 | Admitting: *Deleted

## 2017-06-07 DIAGNOSIS — R200 Anesthesia of skin: Secondary | ICD-10-CM | POA: Insufficient documentation

## 2017-06-07 DIAGNOSIS — I4891 Unspecified atrial fibrillation: Secondary | ICD-10-CM

## 2017-06-07 DIAGNOSIS — Z86718 Personal history of other venous thrombosis and embolism: Secondary | ICD-10-CM

## 2017-06-07 DIAGNOSIS — Z5181 Encounter for therapeutic drug level monitoring: Secondary | ICD-10-CM

## 2017-06-07 DIAGNOSIS — I480 Paroxysmal atrial fibrillation: Secondary | ICD-10-CM

## 2017-06-07 LAB — POCT INR: INR: 2.9

## 2017-06-07 MED ORDER — ENOXAPARIN SODIUM 100 MG/ML ~~LOC~~ SOLN: 100.0000 mg | Freq: Two times a day (BID) | SUBCUTANEOUS | 0 refills | Status: DC

## 2017-06-07 NOTE — Patient Instructions (Addendum)
March 20,.2019 : Last dose of Coumadin.  March 21,2019 : No Coumadin or Lovenox.  March 22,2019 : Inject Lovenox 100mg  in the fatty abdominal tissue at least 2 inches from the belly button twice a day about 12 hours apart, 8am and 8pm rotate sites. No Coumadin.  March 23,2019 : Inject Lovenox in the fatty tissue every 12 hours, 8am and 8pm. No Coumadin.  March 24,2019 : Inject Lovenox in the fatty tissue every 12 hours, 8am and 8pm. No Coumadin.  March 25,2019 : Inject Lovenox in the fatty tissue in the morning at 8 am (No PM dose). No Coumadin.  March 26,2019 : Procedure Day - No Lovenox - Resume Coumadin in the evening or as directed by doctor (take an extra half tablet with usual dose for 2 days then resume normal dose).  March 27,2019 : Resume Lovenox inject in the fatty tissue every 12 hours and take Coumadin.  March 28,2019 : Inject Lovenox in the fatty tissue every 12 hours and take Coumadin.  March 29.2019 : Inject Lovenox in the fatty tissue every 12 hours and take Coumadin.  March 30,2019 : Inject Lovenox in the fatty tissue every 12 hours and take Coumadin.  March 31,2019 : Inject Lovenox in the fatty tissue every 12 hours and take Coumadin.  06/21/2017 : Coumadin appt to check INR. Description   Continue same dose of coumadin  6mg  daily except 4mg  on Mondays, Wednesdays, and Fridays. Last day to take coumadin is Wednesday March 20th See pt instructions  Recheck INR 1 week after procedure   Call Coumadin Clinic [phone] Main # [phone] with any questions

## 2017-06-08 DIAGNOSIS — Z20828 Contact with and (suspected) exposure to other viral communicable diseases: Secondary | ICD-10-CM | POA: Diagnosis not present

## 2017-06-08 DIAGNOSIS — R52 Pain, unspecified: Secondary | ICD-10-CM | POA: Diagnosis not present

## 2017-06-08 DIAGNOSIS — R05 Cough: Secondary | ICD-10-CM | POA: Diagnosis not present

## 2017-06-08 DIAGNOSIS — R0789 Other chest pain: Secondary | ICD-10-CM | POA: Diagnosis not present

## 2017-06-08 DIAGNOSIS — J029 Acute pharyngitis, unspecified: Secondary | ICD-10-CM | POA: Diagnosis not present

## 2017-06-08 DIAGNOSIS — B349 Viral infection, unspecified: Secondary | ICD-10-CM | POA: Diagnosis not present

## 2017-06-11 DIAGNOSIS — R200 Anesthesia of skin: Secondary | ICD-10-CM | POA: Diagnosis not present

## 2017-06-11 DIAGNOSIS — G5603 Carpal tunnel syndrome, bilateral upper limbs: Secondary | ICD-10-CM | POA: Diagnosis not present

## 2017-06-14 ENCOUNTER — Ambulatory Visit: Payer: Self-pay | Admitting: Surgery

## 2017-06-14 DIAGNOSIS — K5732 Diverticulitis of large intestine without perforation or abscess without bleeding: Secondary | ICD-10-CM | POA: Diagnosis not present

## 2017-06-14 DIAGNOSIS — Z01818 Encounter for other preprocedural examination: Secondary | ICD-10-CM | POA: Diagnosis not present

## 2017-06-14 NOTE — H&P (Signed)
[name] Documented: 06/29/1446 10:16 AM Location: [facility] Patient #: [id number] DOB: Jul 09, 1955 Married / Language: Cleophus Molt / Race: White Female  History of Present Illness Adin Hector MD; 06/14/2017 10:48 AM) The patient is a 62 year old female who presents with diverticulitis. Note for "Diverticulitis": ` ` ` Patient sent for surgical consultation at the request of Dr. Dougherty Cellar  Chief Complaint: Recurrent sigmoid diverticulitis  The patient is a morbidly obese female that has been struggling with recurrent attacks of sigmoid diverticulitis. No prior attacks until July 2018. She has not been able to get off antibiotics more than a month before she gets another attack. She just came off her fourth attack earlier this month. She had a colonoscopy that showed some hyperplastic and tubular adenomatous small polyps. Left-sided colon diverticulosis. Because of the repeated flares, she discussed with her gastroenterologist. Recommendation made for considering surgical consultation. She's had a cholecystectomy, C-sections 2, tubal ligation, right inguinal hernia repair. None recently. He is to live in [place]. Most surgery done up there. Her bowels a couple times a day. When she gets diverticulitis, the Rund loose with diarrhea. Walk about 20 minutes without difficulty. She does have atrial fibrillation. She is on warfarin anticoagulation. She is followed by cardiology. Dr. Rayann Heman. Usually when she gets her procedure, he transitions her to Lovenox shots. She does not smoke. She is not a diabetic. Hypertension under control.  No personal nor family history of GI/colon cancer, inflammatory bowel disease, irritable bowel syndrome, allergy such as Celiac Sprue, dietary/dairy problems, colitis, ulcers nor gastritis. No recent sick contacts/gastroenteritis. No travel outside the country. No changes in diet. No dysphagia to solids or liquids. No  significant heartburn or reflux. No hematochezia, hematemesis, coffee ground emesis. No evidence of prior gastric/peptic ulceration.  (Review of systems as stated in this history (HPI) or in the review of systems. Otherwise all other 12 point ROS are negative) ` ` `   Past Surgical History (Tanisha A. Owens Shark, [place]; 06/14/2017 10:19 AM) Breast Biopsy Bilateral. Cesarean Section - Multiple Colon Polyp Removal - Colonoscopy Gallbladder Surgery - Laparoscopic Laparoscopic Inguinal Hernia Surgery Right. Oral Surgery  Diagnostic Studies History (Tanisha A. Owens Shark, [place]; 06/14/2017 10:19 AM) Colonoscopy within last year Mammogram within last year Pap Smear 1-5 years ago  Allergies (Tanisha A. Owens Shark, RMA; 06/14/2017 10:21 AM) Latex Dyes Demerol *ANALGESICS - OPIOID* Statins Penicillins Sulfa Antibiotics Allergies Reconciled  Medication History (Tanisha A. Owens Shark, RMA; 06/14/2017 10:22 AM) Losartan Potassium (25MG  Tablet, Oral) Active. Omeprazole (40MG  Capsule DR, Oral) Active. Oseltamivir Phosphate (75MG  Capsule, Oral) Active. Venlafaxine HCl (37.5MG  Tablet, Oral) Active. Warfarin Sodium (2MG  Tablet, Oral) Active. Verapamil HCl (80MG  Tablet, Oral) Active. Enoxaparin Sodium (100MG /ML Solution, Subcutaneous) Active. Medications Reconciled  Social History (Tanisha A. Owens Shark, [place]; 06/14/2017 10:19 AM) Alcohol use Occasional alcohol use. Caffeine use Tea. No drug use Tobacco use Never smoker.  Family History (Tanisha A. Owens Shark, [place]; 06/14/2017 10:19 AM) Alcohol Abuse Mother. Anesthetic complications Mother. Arthritis Father, Sister. Colon Polyps Father, Sister. Diabetes Mellitus Father. Heart Disease Mother. Heart disease in female family member before age 46 Hypertension Father, Mother. Thyroid problems Sister.  Pregnancy / Birth History (Tanisha A. Owens Shark, [place]; 06/14/2017 10:19 AM) Age at menarche 51 years. Age of menopause <44 Gravida  2 Maternal age 71-30 Para 2  Other Problems (Tanisha A. Owens Shark, [place]; 06/14/2017 10:19 AM) Asthma Atrial Fibrillation Back Pain Gastroesophageal Reflux Disease General anesthesia - complications Heart murmur Hemorrhoids Pulmonary Embolism / Blood Clot in Legs Sleep Apnea  Review of Systems (Tanisha A. Brown RMA; 06/14/2017 10:19 AM) General Present- Appetite Loss, Fatigue and Night Sweats. Not Present- Chills, Fever, Weight Gain and Weight Loss. Skin Not Present- Change in Wart/Mole, Dryness, Hives, Jaundice, New Lesions, Non-Healing Wounds, Rash and Ulcer. HEENT Present- Seasonal Allergies, Sinus Pain and Wears glasses/contact lenses. Not Present- Earache, Hearing Loss, Hoarseness, Nose Bleed, Oral Ulcers, Ringing in the Ears, Sore Throat, Visual Disturbances and Yellow Eyes. Respiratory Present- Difficulty Breathing and Wheezing. Not Present- Bloody sputum, Chronic Cough and Snoring. Breast Not Present- Breast Mass, Breast Pain, Nipple Discharge and Skin Changes. Cardiovascular Present- Palpitations and Shortness of Breath. Not Present- Chest Pain, Difficulty Breathing Lying Down, Leg Cramps, Rapid Heart Rate and Swelling of Extremities. Gastrointestinal Present- Abdominal Pain, Hemorrhoids and Indigestion. Not Present- Bloating, Bloody Stool, Change in Bowel Habits, Chronic diarrhea, Constipation, Difficulty Swallowing, Excessive gas, Gets full quickly at meals, Nausea, Rectal Pain and Vomiting. Female Genitourinary Present- Urgency. Not Present- Frequency, Nocturia, Painful Urination and Pelvic Pain. Musculoskeletal Present- Back Pain. Not Present- Joint Pain, Joint Stiffness, Muscle Pain, Muscle Weakness and Swelling of Extremities. Neurological Not Present- Decreased Memory, Fainting, Headaches, Numbness, Seizures, Tingling, Tremor, Trouble walking and Weakness. Psychiatric Not Present- Anxiety, Bipolar, Change in Sleep Pattern, Depression, Fearful and Frequent  crying. Endocrine Not Present- Cold Intolerance, Excessive Hunger, Hair Changes, Heat Intolerance, Hot flashes and New Diabetes. Hematology Present- Blood Thinners. Not Present- Easy Bruising, Excessive bleeding, Gland problems, HIV and Persistent Infections.  Vitals (Tanisha A. Brown RMA; 06/14/2017 10:20 AM) 06/14/2017 10:20 AM Weight: 231.8 lb Height: 64in Body Surface Area: 2.08 m Body Mass Index: 39.79 kg/m  Temp.: 98.62F  Pulse: 82 (Regular)  BP: 136/84 (Sitting, Left Arm, Standard)      Physical Exam Adin Hector MD; 06/14/2017 10:42 AM)  General Mental Status-Alert. General Appearance-Not in acute distress, Not Sickly. Orientation-Oriented X3. Hydration-Well hydrated. Voice-Normal.  Integumentary Global Assessment Upon inspection and palpation of skin surfaces of the - Axillae: non-tender, no inflammation or ulceration, no drainage. and Distribution of scalp and body hair is normal. General Characteristics Temperature - normal warmth is noted.  Head and Neck Head-normocephalic, atraumatic with no lesions or palpable masses. Face Global Assessment - atraumatic, no absence of expression. Neck Global Assessment - no abnormal movements, no bruit auscultated on the right, no bruit auscultated on the left, no decreased range of motion, non-tender. Trachea-midline. Thyroid Gland Characteristics - non-tender.  Eye Eyeball - Left-Extraocular movements intact, No Nystagmus. Eyeball - Right-Extraocular movements intact, No Nystagmus. Cornea - Left-No Hazy. Cornea - Right-No Hazy. Sclera/Conjunctiva - Left-No scleral icterus, No Discharge. Sclera/Conjunctiva - Right-No scleral icterus, No Discharge. Pupil - Left-Direct reaction to light normal. Pupil - Right-Direct reaction to light normal.  ENMT Ears Pinna - Left - no drainage observed, no generalized tenderness observed. Right - no drainage observed, no generalized  tenderness observed. Nose and Sinuses External Inspection of the Nose - no destructive lesion observed. Inspection of the nares - Left - quiet respiration. Right - quiet respiration. Mouth and Throat Lips - Upper Lip - no fissures observed, no pallor noted. Lower Lip - no fissures observed, no pallor noted. Nasopharynx - no discharge present. Oral Cavity/Oropharynx - Tongue - no dryness observed. Oral Mucosa - no cyanosis observed. Hypopharynx - no evidence of airway distress observed.  Chest and Lung Exam Inspection Movements - Normal and Symmetrical. Accessory muscles - No use of accessory muscles in breathing. Palpation Palpation of the chest reveals - Non-tender. Auscultation Breath sounds - Normal and Clear.  Cardiovascular Auscultation Rhythm - Regular. Murmurs & Other Heart Sounds - Auscultation of the heart reveals - No Murmurs and No Systolic Clicks.  Abdomen Inspection Inspection of the abdomen reveals - No Visible peristalsis and No Abnormal pulsations. Umbilicus - No Bleeding, No Urine drainage. Palpation/Percussion Palpation and Percussion of the abdomen reveal - Soft, Non Tender, No Rebound tenderness, No Rigidity (guarding) and No Cutaneous hyperesthesia. Note: Morbidly obese but otherwise soft. Numerous laparoscopic and lower abdominal incisions. Otherwise soft and nontender. Nondistended. Moderate distasis recti. No umbilical or other anterior abdominal wall hernias  Female Genitourinary Sexual Maturity Tanner 5 - Adult hair pattern. Note: No vaginal bleeding nor discharge  Peripheral Vascular Upper Extremity Inspection - Left - No Cyanotic nailbeds, Not Ischemic. Right - No Cyanotic nailbeds, Not Ischemic.  Neurologic Neurologic evaluation reveals -normal attention span and ability to concentrate, able to name objects and repeat phrases. Appropriate fund of knowledge , normal sensation and normal coordination. Mental Status Affect - not angry, not  paranoid. Cranial Nerves-Normal Bilaterally. Gait-Normal.  Neuropsychiatric Mental status exam performed with findings of-able to articulate well with normal speech/language, rate, volume and coherence, thought content normal with ability to perform basic computations and apply abstract reasoning and no evidence of hallucinations, delusions, obsessions or homicidal/suicidal ideation.  Musculoskeletal Global Assessment Spine, Ribs and Pelvis - no instability, subluxation or laxity. Right Upper Extremity - no instability, subluxation or laxity.  Lymphatic Head & Neck  General Head & Neck Lymphatics: Bilateral - Description - No Localized lymphadenopathy. Axillary  General Axillary Region: Bilateral - Description - No Localized lymphadenopathy. Femoral & Inguinal  Generalized Femoral & Inguinal Lymphatics: Left - Description - No Localized lymphadenopathy. Right - Description - No Localized lymphadenopathy.    Assessment & Plan Adin Hector MD; 06/14/2017 10:49 AM)  DIVERTICULITIS OF LARGE INTESTINE WITHOUT PERFORATION OR ABSCESS WITHOUT BLEEDING (K57.32) Impression: Recurrent sigmoid diverticulitis, just getting over her fourth attack in 8 months. This most recent one rather intense.  I think she's entering a phase of chronic diverticulitis and would benefit from resection of the chronically inflamed segment. Reasonable robotic sigmoid colectomy. The ERAS protocol pathway. Because of yet another attack, she wishes to proceed with surgery.  I would like to make sure it safe to come off her warfarin anticoagulation. She notes her cardiologist usually has her do a Lovenox bridge. I did note there increased risks of bleeding and transfusion readmission rates, but that is much lesser risk than dealing with a stroke or heart attack. Low threshold to have cardiology involved perioperatively the least.  She gets another attack, I would recommend she go back on oral antibiotics and  stay on them until surgery can happen. Fortunately, she's not had a severely complex attack nor has any stricturing or any major concerns that would delay surgery.  Current Plans Pt Education - [facility] Diverticular Disease (AT) I recommended obtaining preoperative cardiac clearance for recommendations on management of anticoagualtion perioperatively:  1. Timing of holding anticoagulation 2. Need for any bridge therapy (SQ enoxaparin, IV heparin, IV Aggrastat, etc) preop/postop. 3. Desired timing of resumption of anticoagulation.  In general from Dr. Johney Maine' standpoint:  Aspirin is okay to continue perioperatively (81mg  or 325mg ) & does not need to be held  Hold warfarin 5 dayspreoperatively. Consider PT/INR level on arrival to short stay the day of surgery. No need to check PT/INR level on preop visit  Hold P2Y12 inibitors such as clopidrogel (Plavix) 4 dayspreoperatively  Hold direct thrombin / factor Xa inhibitors Lennette Bihari,  Pradaxa, Eliquis, etc) 2 dayspreoperatively   Request clearance by cardiology to better assess operative risk & see if a reevaluation, further workup, etc is needed. Also recommendations on how medications such as for anticoagulation and blood pressure should be managed/held/restarted after surgery.   PREOP COLON - ENCOUNTER FOR PREOPERATIVE EXAMINATION FOR GENERAL SURGICAL PROCEDURE (Z01.818)  Current Plans You are being scheduled for surgery- Our schedulers will call you.  You should hear from our office's scheduling department within 5 working days about the location, date, and time of surgery. We try to make accommodations for patient's preferences in scheduling surgery, but sometimes the OR schedule or the surgeon's schedule prevents [place] from making those accommodations.  If you have not heard from our office [phone]) in 5 working days, call the office and ask for your surgeon's nurse.  If you have other questions about your  diagnosis, plan, or surgery, call the office and ask for your surgeon's nurse.  Written instructions provided The anatomy & physiology of the digestive tract was discussed. The pathophysiology of the colon was discussed. Natural history risks without surgery was discussed. I feel the risks of no intervention will lead to serious problems that outweigh the operative risks; therefore, I recommended a partial colectomy to remove the pathology. Minimally invasive (Robotic/Laparoscopic) & open techniques were discussed.  Risks such as bleeding, infection, abscess, leak, reoperation, possible ostomy, hernia, heart attack, death, and other risks were discussed. I noted a good likelihood this will help address the problem. Goals of post-operative recovery were discussed as well. Need for adequate nutrition, daily bowel regimen and healthy physical activity, to optimize recovery was noted as well. We will work to minimize complications. Educational materials were available as well. Questions were answered. The patient expresses understanding & wishes to proceed with surgery.  Pt Education - [facility] Colon Bowel Prep 2018 ERAS/Miralax/Antibiotics Started Neomycin Sulfate 500 MG Oral Tablet, 2 (two) Tablet SEE NOTE, #6, 06/14/2017, No Refill. Local Order: TAKE TWO TABLETS AT 2 PM, 3 PM, AND 10 PM THE DAY PRIOR TO SURGERY Started Flagyl 500 MG Oral Tablet, 2 (two) Tablet SEE NOTE, #6, 06/14/2017, No Refill. Local Order: Take at 2pm, 3pm, and 10pm the day prior to your colon operation Pt Education - Pamphlet Given - Laparoscopic Colorectal Surgery: discussed with patient and provided information. Pt Education - [facility] Colectomy post-op instructions: discussed with patient and provided information.  Adin Hector, M.D., F.A.C.S. Gastrointestinal and Minimally Invasive Surgery [facility] [address] [place], [place] [address] [phone] Main / Paging

## 2017-06-15 ENCOUNTER — Ambulatory Visit (AMBULATORY_SURGERY_CENTER): Payer: 59 | Admitting: Gastroenterology

## 2017-06-15 ENCOUNTER — Encounter: Payer: Self-pay | Admitting: Gastroenterology

## 2017-06-15 ENCOUNTER — Other Ambulatory Visit: Payer: Self-pay

## 2017-06-15 VITALS — BP 140/63 | HR 75 | Temp 98.4°F | Resp 15 | Ht 65.0 in | Wt 231.0 lb

## 2017-06-15 DIAGNOSIS — K219 Gastro-esophageal reflux disease without esophagitis: Secondary | ICD-10-CM

## 2017-06-15 DIAGNOSIS — K317 Polyp of stomach and duodenum: Secondary | ICD-10-CM

## 2017-06-15 DIAGNOSIS — R1310 Dysphagia, unspecified: Secondary | ICD-10-CM

## 2017-06-15 DIAGNOSIS — J45909 Unspecified asthma, uncomplicated: Secondary | ICD-10-CM | POA: Diagnosis not present

## 2017-06-15 DIAGNOSIS — I4891 Unspecified atrial fibrillation: Secondary | ICD-10-CM | POA: Diagnosis not present

## 2017-06-15 MED ORDER — SUCRALFATE 1 GM/10ML PO SUSP: 1.0000 g | Freq: Four times a day (QID) | ORAL | 1 refills | Status: DC | PRN

## 2017-06-15 MED ORDER — SODIUM CHLORIDE 0.9 % IV SOLN: 500.0000 mL | Freq: Once | INTRAVENOUS | Status: DC

## 2017-06-15 MED ORDER — PANTOPRAZOLE SODIUM 40 MG PO TBEC: 40.0000 mg | DELAYED_RELEASE_TABLET | Freq: Two times a day (BID) | ORAL | 3 refills | Status: DC

## 2017-06-15 NOTE — Progress Notes (Signed)
Report given to PACU, vss 

## 2017-06-15 NOTE — Patient Instructions (Signed)
Discharge instructions given. Biopsies taken. Prescription sent to pharmacy. YOU HAD AN ENDOSCOPIC PROCEDURE TODAY AT [facility]:   Refer to the procedure report that was given to you for any specific questions about what was found during the examination.  If the procedure report does not answer your questions, please call your gastroenterologist to clarify.  If you requested that your care partner not be given the details of your procedure findings, then the procedure report has been included in a sealed envelope for you to review at your convenience later.  YOU SHOULD EXPECT: Some feelings of bloating in the abdomen. Passage of more gas than usual.  Walking can help get rid of the air that was put into your GI tract during the procedure and reduce the bloating. If you had a lower endoscopy (such as a colonoscopy or flexible sigmoidoscopy) you may notice spotting of blood in your stool or on the toilet paper. If you underwent a bowel prep for your procedure, you may not have a normal bowel movement for a few days.  Please Note:  You might notice some irritation and congestion in your nose or some drainage.  This is from the oxygen used during your procedure.  There is no need for concern and it should clear up in a day or so.  SYMPTOMS TO REPORT IMMEDIATELY:   Following lower endoscopy (colonoscopy or flexible sigmoidoscopy):  Excessive amounts of blood in the stool  Significant tenderness or worsening of abdominal pains  Swelling of the abdomen that is new, acute  Fever of 100F or higher   Following upper endoscopy (EGD)  Vomiting of blood or coffee ground material  New chest pain or pain under the shoulder blades  Painful or persistently difficult swallowing  New shortness of breath  Fever of 100F or higher  Black, tarry-looking stools  For urgent or emergent issues, a gastroenterologist can be reached at any hour by calling [phone].   DIET:  We do  recommend a small meal at first, but then you may proceed to your regular diet.  Drink plenty of fluids but you should avoid alcoholic beverages for 24 hours.  ACTIVITY:  You should plan to take it easy for the rest of today and you should NOT DRIVE or use heavy machinery until tomorrow (because of the sedation medicines used during the test).    FOLLOW UP: Our staff will call the number listed on your records the next business day following your procedure to check on you and address any questions or concerns that you may have regarding the information given to you following your procedure. If we do not reach you, we will leave a message.  However, if you are feeling well and you are not experiencing any problems, there is no need to return our call.  We will assume that you have returned to your regular daily activities without incident.  If any biopsies were taken you will be contacted by phone or by letter within the next 1-3 weeks.  Please call us at [phone] if you have not heard about the biopsies in 3 weeks.    SIGNATURES/CONFIDENTIALITY: You and/or your care partner have signed paperwork which will be entered into your electronic medical record.  These signatures attest to the fact that that the information above on your After Visit Summary has been reviewed and is understood.  Full responsibility of the confidentiality of this discharge information lies with you and/or your care-partner.

## 2017-06-15 NOTE — Op Note (Signed)
[facility] Patient Name: [name] Procedure Date: 06/15/2017 1:27 PM MRN: [id number] Endoscopist: Remo Lipps P. Kayliana Codd MD, MD Age: 62 Referring MD:  Date of Birth: 10/06/1955 Gender: Female Account #: [id number] Procedure:                Upper GI endoscopy Indications:              Dysphagia, Heartburn Medicines:                Monitored Anesthesia Care Procedure:                Pre-Anesthesia Assessment:                           - Prior to the procedure, a History and Physical                            was performed, and patient medications and                            allergies were reviewed. The patient's tolerance of                            previous anesthesia was also reviewed. The risks                            and benefits of the procedure and the sedation                            options and risks were discussed with the patient.                            All questions were answered, and informed consent                            was obtained. Prior Anticoagulants: The patient has                            taken Lovenox (enoxaparin), last dose was 1 day                            prior to procedure. Last took coumadin 5 days ago.                            ASA Grade Assessment: III - A patient with severe                            systemic disease. After reviewing the risks and                            benefits, the patient was deemed in satisfactory                            condition to undergo the procedure.  After obtaining informed consent, the endoscope was                            passed under direct vision. Throughout the                            procedure, the patient's blood pressure, pulse, and                            oxygen saturations were monitored continuously. The                            Endoscope was introduced through the mouth, and                            advanced to the second part of  duodenum. The upper                            GI endoscopy was accomplished without difficulty.                            The patient tolerated the procedure well. Scope In: Scope Out: Findings:                 Esophagogastric landmarks were identified: the                            Z-line was found at 34 cm, the gastroesophageal                            junction was found at 34 cm and the upper extent of                            the gastric folds was found at 37 cm from the                            incisors.                           A 3 cm hiatal hernia was present.                           The exam of the esophagus was otherwise normal. No                            stenosis / stricture noted.                           Two polyps - 5-51mm each in close proximity to each                            other, cummulative 10 mm in size or so, sessile  ulcerated / inflamed polyp was found in the cardia.                            Biopsy was taken at the base / nonulcerated area                            for histology.                           Multiple 3 to 15 mm sessile polyps were found in                            the entire examined stomach. Some were erythematous                            / inflamed. Biopsies of the largest polyp were                            taken with a cold forceps for histology.                           The exam of the stomach was otherwise normal.                           The duodenal bulb and second portion of the                            duodenum were normal. Complications:            No immediate complications. Estimated blood loss:                            Minimal. Estimated Blood Loss:     Estimated blood loss was minimal. Impression:               - Esophagogastric landmarks identified.                           - 3 cm hiatal hernia.                           - Normal esophagus otherwise - no Barrett's                             esophagus, no stenosis /                           stricture                           - Ulcerated / inflamed gastric polyp - I suspect                            inflammatory / hyperplastic / fundic gland.  Biopsied.                           - Multiple benign appearing gastric polyps, some                            inflamed, largest biopsied.                           - Normal duodenal bulb and second portion of the                            duodenum.                           Cardia polyp is likely benign but ulcerated /                            inflamed, at risk for bleeding given                            anticoagulation use. Not removed today given risks                            for bleeding, will need removal at the hospital. Recommendation:           - Patient has a contact number available for                            emergencies. The signs and symptoms of potential                            delayed complications were discussed with the                            patient. Return to normal activities tomorrow.                            Written discharge instructions were provided to the                            patient.                           - Resume previous diet.                           - Continue present medications.                           - Resume coumadin tonight                           - Resume lovenox tonight                           - Consider switching to another PPI (protonix 40mg   BID) if continued breakthrough despite omeprazole                            (patient states improved on BID)                           - Add liquid carafate 10cc po q 6 hours PRN                           - Await pathology results. Remo Lipps P. Anneta Rounds MD, MD 06/15/2017 1:59:11 PM This report has been signed electronically.

## 2017-06-16 ENCOUNTER — Telehealth: Payer: Self-pay | Admitting: *Deleted

## 2017-06-16 ENCOUNTER — Telehealth: Payer: Self-pay | Admitting: Gastroenterology

## 2017-06-16 ENCOUNTER — Telehealth: Payer: Self-pay

## 2017-06-16 DIAGNOSIS — K219 Gastro-esophageal reflux disease without esophagitis: Secondary | ICD-10-CM

## 2017-06-16 NOTE — Telephone Encounter (Signed)
Dr Havery Moros- Your last office note 06/01/17, indicates that patient has been increased to omeprazole 40 mg twice daily. Your endoscopy note indicates that we should consider another PPI (protonix 40 mg bid) IF continued breakthrough on omeprazole. However, that note also indicates that patient says they have improved on bid omeprazole. It appears that pantoprazole was sent into the pharmacy from [facility] on the day of procedure in place of omeprazole. Please clarify.... You do want patient to continue omeprazole twice daily as long as they are doing well, correct? Pantoprazole was only IF they are no longer doing well?!

## 2017-06-16 NOTE — Telephone Encounter (Signed)
Left message

## 2017-06-16 NOTE — Telephone Encounter (Addendum)
   [facility] Pre-operative Risk Assessment    Request for surgical clearance:  1. What type of surgery is being performed? Robotic resection of sigmoid colon    2. When is this surgery scheduled? Pending    3. What type of clearance is required (medical clearance vs. Pharmacy clearance to hold med vs. Both)? Both   4. Are there any medications that need to be held prior to surgery and how long?Coumadin, 2 days     5. Practice name and name of physician performing surgery? [facility], Dr. Johney Maine    6. What is your office phone and fax number?  [phone], fax: [phone]   7. Anesthesia type (None, local, MAC, general) ? General    Joaquim Lai 06/16/2017, 3:58 PM  _________________________________________________________________   (provider comments below)

## 2017-06-16 NOTE — Telephone Encounter (Signed)
  Follow up Call-  Call back number 06/15/2017 04/06/2017  Post procedure Call Back phone  # [phone]  Permission to leave phone message Yes Yes  Some recent data might be hidden     Patient questions:  Do you have a fever, pain , or abdominal swelling? No. Pain Score  0 *  Have you tolerated food without any problems? Yes.    Have you been able to return to your normal activities? Yes.    Do you have any questions about your discharge instructions: Diet   No. Medications  No. Follow up visit  Yes.    Do you have questions or concerns about your Care? No.  Actions: * If pain score is 4 or above Physician/ provider Notified  Armbruster.  Pt. Wants to know if her surgery for diverticulitis will be in concert with removal of cardia polyp.  I told her to call your nurse to see what your advice is about these procedures being done at the same time

## 2017-06-16 NOTE — Telephone Encounter (Signed)
Patient advised that once the pathology is back then we will contact her.

## 2017-06-16 NOTE — Telephone Encounter (Signed)
Please see phone note from Waldo Laine today, I have not talked to patient.

## 2017-06-16 NOTE — Telephone Encounter (Signed)
Thanks [name]. Removal of polyps will be done at a separate time / visit from her colon surgery. I need pathology results back first prior to removing polyps. She will be contacted when this is resulted.

## 2017-06-17 ENCOUNTER — Encounter: Payer: Self-pay | Admitting: *Deleted

## 2017-06-17 MED ORDER — PANTOPRAZOLE SODIUM 40 MG PO TBEC: 40.0000 mg | DELAYED_RELEASE_TABLET | Freq: Two times a day (BID) | ORAL | 3 refills | Status: DC

## 2017-06-17 NOTE — Telephone Encounter (Signed)
Ok, will send enough protonix for twice daily dosing then. It appears only 90 tabs were sent which will not hold her over for 90 day script at twice daily dosing.

## 2017-06-17 NOTE — Telephone Encounter (Signed)
Thanks for the message, sorry I should have been more clear. The patient stated she felt improved on twice daily omprazole compared to once daily dosing, but symptoms were not resolved completely and she continued to have breakthrough every other day. I had recommended trying a different PPI and recommended protonix 40mg  BID and see if she had a better response. Thanks

## 2017-06-18 ENCOUNTER — Ambulatory Visit
Admission: RE | Admit: 2017-06-18 | Discharge: 2017-06-18 | Disposition: A | Discharge status: Home / Self Care | Payer: 59 | Source: Ambulatory Visit | Attending: Family Medicine | Admitting: Family Medicine

## 2017-06-18 ENCOUNTER — Other Ambulatory Visit: Payer: Self-pay | Admitting: Family Medicine

## 2017-06-18 DIAGNOSIS — J209 Acute bronchitis, unspecified: Secondary | ICD-10-CM

## 2017-06-18 DIAGNOSIS — R0981 Nasal congestion: Secondary | ICD-10-CM | POA: Diagnosis not present

## 2017-06-18 DIAGNOSIS — R5383 Other fatigue: Secondary | ICD-10-CM | POA: Diagnosis not present

## 2017-06-18 DIAGNOSIS — R0602 Shortness of breath: Secondary | ICD-10-CM | POA: Diagnosis not present

## 2017-06-18 DIAGNOSIS — R05 Cough: Secondary | ICD-10-CM | POA: Diagnosis not present

## 2017-06-18 NOTE — Telephone Encounter (Signed)
Patient called and made aware. Appointment scheduled for 06/22/17 with Rosaria Ferries, PA.

## 2017-06-18 NOTE — Telephone Encounter (Signed)
   Primary Cardiologist:James Allred, MD  Chart reviewed as part of pre-operative protocol coverage. Because of [name]'s past medical history and time since last visit, he/she will require a follow-up visit in order to better assess preoperative cardiovascular risk.  Pre-op covering staff: - Please schedule appointment and call patient to inform them. - Please contact requesting surgeon's office via preferred method (i.e, phone, fax) to inform them of need for appointment prior to surgery.  Kerin Ransom, PA-C  06/18/2017, 3:16 PM

## 2017-06-21 ENCOUNTER — Ambulatory Visit (INDEPENDENT_AMBULATORY_CARE_PROVIDER_SITE_OTHER): Payer: 59 | Admitting: *Deleted

## 2017-06-21 DIAGNOSIS — I480 Paroxysmal atrial fibrillation: Secondary | ICD-10-CM | POA: Diagnosis not present

## 2017-06-21 DIAGNOSIS — I4891 Unspecified atrial fibrillation: Secondary | ICD-10-CM

## 2017-06-21 DIAGNOSIS — Z5181 Encounter for therapeutic drug level monitoring: Secondary | ICD-10-CM

## 2017-06-21 LAB — POCT INR: INR: 2.7

## 2017-06-21 NOTE — Patient Instructions (Signed)
Description   Stop Lovenox injections. Continue same dose of coumadin 6mg  daily except 4mg  on Mondays, Wednesdays, and Fridays. Recheck INR 2 weeks.  Call Coumadin Clinic [phone] Main # [phone] with any questions

## 2017-06-22 ENCOUNTER — Encounter: Payer: Self-pay | Admitting: Physician Assistant

## 2017-06-22 ENCOUNTER — Ambulatory Visit (INDEPENDENT_AMBULATORY_CARE_PROVIDER_SITE_OTHER): Payer: 59 | Admitting: Physician Assistant

## 2017-06-22 VITALS — BP 132/88 | HR 50 | Ht 65.0 in | Wt 233.4 lb

## 2017-06-22 DIAGNOSIS — Z86718 Personal history of other venous thrombosis and embolism: Secondary | ICD-10-CM | POA: Diagnosis not present

## 2017-06-22 DIAGNOSIS — Z01818 Encounter for other preprocedural examination: Secondary | ICD-10-CM

## 2017-06-22 DIAGNOSIS — I480 Paroxysmal atrial fibrillation: Secondary | ICD-10-CM | POA: Diagnosis not present

## 2017-06-22 NOTE — Patient Instructions (Signed)
You cleared for surgery! We will fax clearance information to your surgeon's office. Please keep your pharmacy appointment for lovenox bridging.  Rosaria Ferries, PA-C recommends that you schedule a follow-up appointment in 6 months with Dr Rayann Heman. You will receive a reminder letter in the mail two months in advance. If you don't receive a letter, please call our office to schedule the follow-up appointment.  If you need a refill on your cardiac medications before your next appointment, please call your pharmacy.

## 2017-06-22 NOTE — Progress Notes (Signed)
Cardiology Office Note   Date:  06/22/2017   ID:  [name], DOB [birth year], MRN [id number]  PCP:  Antony Contras, MD  Cardiologist: Dr. Rayann Heman, 11/11/2016  Rosaria Ferries, PA-C    History of Present Illness: [name] is a 62 y.o. female with a history of atrial fib s/p ablation x 2 ([facility] 2010 & [facility] 04/2016), on coumadin, DM, HTN, HLD, obesity, mild OSA  Pt evaluated for diverticulitis, needs robotic resection of sigmoid colon, preop eval appointment arranged.  [name] presents for cardiology evaluation.  She has had 4 bouts of diverticulitis in the last 9 months. At the end of the last episode, she got a sinus infection, then bronchitis. She has been fighting bronchitis recently, is at the end of a steroid taper. She is finally starting to feel that she is getting over all this.   She tracks her HR on her [employer] monitor. She follows her heart rate and checks it when she has symptoms. She has not had any recurrence of the afib since the ablation, has PACs and PVCs at times. She notices her p waves disappear at times, HR can be fast or slow. No sx with this.   She works from home and her office is upstairs. She is reviewing medical records for [facility]. She can do a whole floor without stopping. She will be SOB at the top, but that resolves very quickly.   She will occasionally get some CP w/ palpitations or with SOB. The sx are relieved by using her inhaler or taking extra verapamil. She has not had to do that recently.   Weight is stable no LE edema, no orthopnea or PND.    Past Medical History:  Diagnosis Date  . Allergic rhinitis   . Arthritis    "back" (05/07/2016)  . Asthma   . Atrial flutter ([place])    a. Remotely per notes.  . Carpal tunnel syndrome, bilateral   . Colon polyps   . Diverticulitis   . DM (diabetes mellitus) ([place])   . DVT (deep venous thrombosis) ([place]) 1980s x2 -    between birth of two sons. Saw hematology - was told she  has a hypercoagulable disorder, should be on Coumadin lifelong.  . Family history of adverse reaction to anesthesia    "mother gets PONV"  . Gallbladder disease   . GERD (gastroesophageal reflux disease)   . Halothane adverse reaction    narrow small opening  . Heart murmur   . Hiatal hernia   . History of hiatal hernia    "small one/CTA 05/01/2016" (05/07/2016)  . HTN (hypertension)   . Hypercholesteremia    hx; "brought it down w/diet" (05/07/2016)  . Hyperglycemia    a. A1c 6.1 in 2014.  . Inguinal hernia   . Left sided sciatica   . Normal cardiac stress test 06/2012  . Obesity   . OSA (obstructive sleep apnea)    a. Mild, did not tolerate CPAP (05/07/2016)  . PAF (paroxysmal atrial fibrillation) ([place])    a. s/p afib ablation at [facility] 2010 (Dr. Annabell Howells). b. Recurrent AF s/p DCCV 06/2010. c. On flecainide.   Marland Kitchen PONV (postoperative nausea and vomiting)   . PPD positive, treated 1977   "treated for 1 yr after exposure to patient"  . RSV infection ~ 2006  . Wandering (atrial) pacemaker    a. Remotely per notes.    Past Surgical History:  Procedure Laterality Date  . ATRIAL  ABLATION SURGERY  05/07/2016   "fib and flutter"  . ATRIAL FIBRILLATION ABLATION  2010   at [facility]  . ATRIAL FIBRILLATION ABLATION N/A 05/07/2016   Procedure: Atrial Fibrillation Ablation;  Surgeon: Thompson Grayer, MD;  Location: [facility];  Service: Cardiovascular;  Laterality: N/A;  . BREAST CYST ASPIRATION Bilateral   . CARDIAC CATHETERIZATION  2008  . CARPAL TUNNEL RELEASE Bilateral   . CESAREAN SECTION  1986; 1988  . COLONOSCOPY  X 2  . COLONOSCOPY W/ BIOPSIES AND POLYPECTOMY  X 2  . ESOPHAGOGASTRODUODENOSCOPY    . INGUINAL HERNIA REPAIR Right   . LAPAROSCOPIC CHOLECYSTECTOMY  1989  . TMJ ARTHROPLASTY    . TUBAL LIGATION  1988    Current Outpatient Medications  Medication Sig Dispense Refill  . acetaminophen (TYLENOL) 650 MG CR tablet Take 1,300 mg by mouth 2 (two) times  daily.    . cetirizine (ZYRTEC) 10 MG tablet Take 10 mg by mouth daily.      . Cholecalciferol (VITAMIN D3) 1000 UNITS CAPS Take 2,000 Units by mouth daily.     Marland Kitchen dicyclomine (BENTYL) 10 MG capsule Take 1 tablet by mouth 30 min before meals and at bedtime as needed for pain, cramping. 120 capsule 1  . enoxaparin (LOVENOX) 100 MG/ML injection Inject 1 mL (100 mg total) into the skin every 12 (twelve) hours. 10 Syringe 0  . FLOVENT HFA 220 MCG/ACT inhaler Inhale 1 puff into the lungs 2 (two) times daily.  1  . fluticasone (FLONASE) 50 MCG/ACT nasal spray Place 1 spray into the nose 2 (two) times daily.     Marland Kitchen losartan (COZAAR) 25 MG tablet Take 1 tablet (25 mg total) by mouth daily. 90 tablet 0  . Multiple Vitamin (MULTIVITAMIN WITH MINERALS) TABS tablet Take 1 tablet by mouth daily.    . pantoprazole (PROTONIX) 40 MG tablet Take 1 tablet (40 mg total) by mouth 2 (two) times daily. 180 tablet 3  . polyethylene glycol (MIRALAX) packet Take 17 g by mouth daily.    Marland Kitchen PROAIR HFA 108 (90 BASE) MCG/ACT inhaler Inhale 2 puffs into the lungs every 6 (six) hours as needed for wheezing or shortness of breath.     . sucralfate (CARAFATE) 1 GM/10ML suspension Take 10 mLs (1 g total) by mouth every 6 (six) hours as needed. 420 mL 1  . venlafaxine XR (EFFEXOR-XR) 37.5 MG 24 hr capsule Take 37.5 mg by mouth daily with breakfast.     . verapamil (CALAN) 80 MG tablet Take 160 mg by mouth 2 (two) times daily.    Marland Kitchen warfarin (COUMADIN) 2 MG tablet TAKE AS DIRECTED BY COUMADIN CLINIC 225 tablet 1   Current Facility-Administered Medications  Medication Dose Route Frequency Provider Last Rate Last Dose  . 0.9 %  sodium chloride infusion  500 mL Intravenous Once Armbruster, Carlota Raspberry, MD      . 0.9 %  sodium chloride infusion  500 mL Intravenous Once Armbruster, Carlota Raspberry, MD      . triamcinolone acetonide (KENALOG) 10 MG/ML injection 10 mg  10 mg Other Once Harriet Masson, DPM      . triamcinolone acetonide (KENALOG) 10  MG/ML injection 10 mg  10 mg Other Once Harriet Masson, DPM        Allergies:   Aspirin; Ivp dye [iodinated diagnostic agents]; Latex; Meperidine hcl; Penicillins; Statins; Sulfonamide derivatives; and Food    Social History:  The patient  reports that she has never smoked. She has  never used smokeless tobacco. She reports that she drinks alcohol. She reports that she does not use drugs.   Family History:  The patient's family history includes Colon polyps in her father; Heart attack in her mother and paternal grandfather; Lupus in her sister; Stroke in her maternal grandfather, mother, and paternal grandmother; Thyroid disease in her mother and sister.    ROS:  Please see the history of present illness. All other systems are reviewed and negative.    PHYSICAL EXAM: VS:  BP 132/88 (BP Location: Left Arm, Patient Position: Sitting, Cuff Size: Large)   Pulse (!) 50   Ht 5\' 5"  (1.651 m)   Wt 233 lb 6.4 oz (105.9 kg)   BMI 38.84 kg/m  , BMI Body mass index is 38.84 kg/m. GEN: Well nourished, well developed, female in no acute distress  HEENT: normal for age  Neck: no JVD, no carotid bruit, no masses Cardiac: RRR; soft murmur, no rubs, or gallops Respiratory:  clear to auscultation bilaterally, normal work of breathing GI: soft, nontender, nondistended, + BS MS: no deformity or atrophy; no edema; distal pulses are 2+ in all 4 extremities   Skin: warm and dry, no rash Neuro:  Strength and sensation are intact Psych: euthymic mood, full affect   EKG:  EKG is ordered today. The ekg ordered today demonstrates sinus brady, HR 50. No acute ischemic changes, no change other than rate, from 10/2016  MYOVIEW: 05/14/2015  Nuclear stress EF: 67%.  There was no ST segment deviation noted during stress.  There is a large defect of moderate severity present in the basal inferolateral, basal anterolateral, mid inferolateral, apical lateral and apex location. The defect is non-reversible.There  is a medium defect of moderate severity present in the mid anteroseptal, mid inferoseptal and apical septal location. The defect is non-reversible. Defects are consistent with breast attenuation artifact. No ischemia noted.  This is a low risk study.  The left ventricular ejection fraction is hyperdynamic (>65%).  ECHO: 09/02/2015 - Left ventricle: The cavity size was normal. Wall thickness was   increased in a pattern of mild LVH. Systolic function was   vigorous. The estimated ejection fraction was in the range of 65%   to 70%. Wall motion was normal; there were no regional wall   motion abnormalities. Features are consistent with a pseudonormal   left ventricular filling pattern, with concomitant abnormal   relaxation and increased filling pressure (grade 2 diastolic   dysfunction). - Mitral valve: Calcified annulus. - Left atrium: The atrium was at the upper limits of normal in size. - Right atrium: Central venous pressure (est): 3 mm Hg. - Tricuspid valve: There was trivial regurgitation. - Pulmonary arteries: Systolic pressure could not be accurately   estimated. - Pericardium, extracardiac: A prominent pericardial fat pad was   present. Impressions: - Mild LVH with LVEF 65-70%. Grade 2 diastolic dysfunction with   increased LV filling pressure. Upper normal left atrial chamber   size. Mildly calcified mitral annulus. Trivial tricuspid   regurgitation. Prominent pericardial fat pad.  Recent Labs: 11/24/2016: Hemoglobin 13.4; Platelets 417.0 02/25/2017: ALT 21; BUN 10; Creatinine, Ser 0.64; Potassium 3.5; Sodium 138    Lipid Panel    Component Value Date/Time   CHOL 238 (H) 01/13/2013 0139   TRIG 247 (H) 01/13/2013 0139   HDL 48 01/13/2013 0139   CHOLHDL 5.0 01/13/2013 0139   VLDL 49 (H) 01/13/2013 0139   LDLCALC 141 (H) 01/13/2013 0139     Wt Readings from  Last 3 Encounters:  06/22/17 233 lb 6.4 oz (105.9 kg)  06/15/17 231 lb (104.8 kg)  06/01/17 231 lb 9.6 oz  (105.1 kg)     Other studies Reviewed: Additional studies/ records that were reviewed today include: office notes, hospital records and testing.  ASSESSMENT AND PLAN:  1.  Preop evaluation: She is having a moderate risk surgery.  She can walk up steps and therefore can do 4 metastases.  She is not having any ischemic symptoms.  She has no history of ischemic heart disease.  She is at acceptable risk for the planned procedure without additional cardiac workup.  2.  Chronic anticoagulation: She was told in the past that she had a hypercoagulable state and must be on lifelong Coumadin.  She has been bridged with Lovenox for procedures in the past.  She has an appointment with the Coumadin clinic to arrange this again.  3.  Hypertension: Her blood pressure is minimally above target today, the patient states that it normally runs okay at home.  No med changes.   Current medicines are reviewed at length with the patient today.  The patient does not have concerns regarding medicines.  The following changes have been made:  no change  Labs/ tests ordered today include:   Orders Placed This Encounter  Procedures  . EKG 12-Lead     Disposition:   FU with Dr. Rayann Heman  Signed, Rosaria Ferries, PA-C  06/22/2017 12:19 PM    [place] Phone: [phone]; Fax: [phone]  This note was written with the assistance of speech recognition software. Please excuse any transcriptional errors.

## 2017-06-23 ENCOUNTER — Other Ambulatory Visit: Payer: Self-pay

## 2017-06-23 ENCOUNTER — Telehealth: Payer: Self-pay

## 2017-06-23 DIAGNOSIS — K317 Polyp of stomach and duodenum: Secondary | ICD-10-CM

## 2017-06-23 NOTE — Telephone Encounter (Signed)
[facility] Pre-operative Risk Assessment     Request for surgical clearance:     Endoscopy Procedure  What type of surgery is being performed?     EGD with polypectomy at hospital  When is this surgery scheduled?     07/19/17  What type of clearance is required ?   Pharmacy  Are there any medications that need to be held prior to surgery and how long? Coumadin 5 days, with a lovenox bridge  Practice name and name of physician performing surgery?      [facility], Dr. Havery Moros  What is your office phone and fax number?      Phone- [phone]  [phone]  Anesthesia type (None, local, MAC, general) ?       MAC

## 2017-06-24 ENCOUNTER — Telehealth: Payer: Self-pay | Admitting: *Deleted

## 2017-06-24 NOTE — Telephone Encounter (Signed)
Received a voicemail from the pt & she stated she is having a GI procedure on 07/19/17  By Dr. Havery Moros and on 08/06/17 she is having Colon procedure by Dr. Johney Maine. She stated she has to hold her Warfarin/Coumadin & do Lovenox Bridge for these procedure. Pt states that the Doctors will be sending over clearance for these procedures in order to approve to hold & proceed with Lovenox. Pt is aware once the Clearance paperwork is received the Preop Clearance team will place a note in the system and send to our Pharmacist so we can ensure proper protocol is followed & she verbalized understanding.

## 2017-06-26 ENCOUNTER — Encounter (HOSPITAL_BASED_OUTPATIENT_CLINIC_OR_DEPARTMENT_OTHER): Payer: Self-pay | Admitting: Emergency Medicine

## 2017-06-26 ENCOUNTER — Other Ambulatory Visit: Payer: Self-pay

## 2017-06-26 ENCOUNTER — Emergency Department (HOSPITAL_BASED_OUTPATIENT_CLINIC_OR_DEPARTMENT_OTHER)
Admission: EM | Admit: 2017-06-26 | Discharge: 2017-06-26 | Disposition: A | Discharge status: Home / Self Care | Payer: 59 | Attending: Emergency Medicine | Admitting: Emergency Medicine

## 2017-06-26 ENCOUNTER — Emergency Department (HOSPITAL_BASED_OUTPATIENT_CLINIC_OR_DEPARTMENT_OTHER): Payer: 59

## 2017-06-26 DIAGNOSIS — R222 Localized swelling, mass and lump, trunk: Secondary | ICD-10-CM | POA: Diagnosis present

## 2017-06-26 DIAGNOSIS — R079 Chest pain, unspecified: Secondary | ICD-10-CM | POA: Diagnosis not present

## 2017-06-26 DIAGNOSIS — R0789 Other chest pain: Secondary | ICD-10-CM | POA: Diagnosis not present

## 2017-06-26 DIAGNOSIS — Z7901 Long term (current) use of anticoagulants: Secondary | ICD-10-CM | POA: Insufficient documentation

## 2017-06-26 DIAGNOSIS — R0602 Shortness of breath: Secondary | ICD-10-CM | POA: Diagnosis not present

## 2017-06-26 DIAGNOSIS — L089 Local infection of the skin and subcutaneous tissue, unspecified: Secondary | ICD-10-CM

## 2017-06-26 DIAGNOSIS — Z79899 Other long term (current) drug therapy: Secondary | ICD-10-CM | POA: Diagnosis not present

## 2017-06-26 DIAGNOSIS — Z91040 Latex allergy status: Secondary | ICD-10-CM | POA: Diagnosis not present

## 2017-06-26 DIAGNOSIS — E7800 Pure hypercholesterolemia, unspecified: Secondary | ICD-10-CM | POA: Diagnosis not present

## 2017-06-26 DIAGNOSIS — I480 Paroxysmal atrial fibrillation: Secondary | ICD-10-CM | POA: Diagnosis not present

## 2017-06-26 DIAGNOSIS — E119 Type 2 diabetes mellitus without complications: Secondary | ICD-10-CM | POA: Insufficient documentation

## 2017-06-26 DIAGNOSIS — I10 Essential (primary) hypertension: Secondary | ICD-10-CM | POA: Insufficient documentation

## 2017-06-26 DIAGNOSIS — J45909 Unspecified asthma, uncomplicated: Secondary | ICD-10-CM | POA: Insufficient documentation

## 2017-06-26 DIAGNOSIS — L723 Sebaceous cyst: Secondary | ICD-10-CM | POA: Diagnosis not present

## 2017-06-26 DIAGNOSIS — Z86718 Personal history of other venous thrombosis and embolism: Secondary | ICD-10-CM | POA: Insufficient documentation

## 2017-06-26 LAB — CBC
HCT: 40.1 % (ref 36.0–46.0)
HEMOGLOBIN: 13.4 g/dL (ref 12.0–15.0)
MCH: 29.5 pg (ref 26.0–34.0)
MCHC: 33.4 g/dL (ref 30.0–36.0)
MCV: 88.1 fL (ref 78.0–100.0)
PLATELETS: 373 10*3/uL (ref 150–400)
RBC: 4.55 MIL/uL (ref 3.87–5.11)
RDW: 13.8 % (ref 11.5–15.5)
WBC: 6.9 10*3/uL (ref 4.0–10.5)

## 2017-06-26 LAB — BASIC METABOLIC PANEL
ANION GAP: 11 (ref 5–15)
BUN: 13 mg/dL (ref 6–20)
CHLORIDE: 104 mmol/L (ref 101–111)
CO2: 21 mmol/L — ABNORMAL LOW (ref 22–32)
Calcium: 8.9 mg/dL (ref 8.9–10.3)
Creatinine, Ser: 0.59 mg/dL (ref 0.44–1.00)
GFR calc Af Amer: >60 mL/min (ref >60)
Glucose, Bld: 183 mg/dL — ABNORMAL HIGH (ref 65–99)
POTASSIUM: 3.3 mmol/L — AB (ref 3.5–5.1)
SODIUM: 136 mmol/L (ref 135–145)

## 2017-06-26 LAB — TROPONIN I: Troponin I: <0.03 ng/mL (ref <0.03)

## 2017-06-26 MED ORDER — LIDOCAINE-EPINEPHRINE (PF) 2 %-1:200000 IJ SOLN
10.0000 mL | Freq: Once | INTRAMUSCULAR | Status: AC
  Administered 2017-06-26: 10 mL
  Filled 2017-06-26: qty 10

## 2017-06-26 NOTE — Discharge Instructions (Signed)
Please read and follow all provided instructions.  Your diagnoses today include:  1. Infected sebaceous cyst of skin   2. Chest pain, unspecified type     Tests performed today include:  An EKG of your heart  A chest x-ray  Cardiac enzymes - a blood test for heart muscle damage  Blood counts and electrolytes  Vital signs. See below for your results today.   Medications prescribed:   None  Take any prescribed medications only as directed.  Follow-up instructions: Please follow-up with your primary care provider as soon as you can for further evaluation of your symptoms.   Return instructions:  SEEK IMMEDIATE MEDICAL ATTENTION IF:  You have severe chest pain, especially if the pain is crushing or pressure-like and spreads to the arms, back, neck, or jaw, or if you have sweating, nausea (feeling sick to your stomach), or shortness of breath. THIS IS AN EMERGENCY. Don't wait to see if the pain will go away. Get medical help at once. Call 911 or 0 (operator). DO NOT drive yourself to the hospital.   Your chest pain gets worse and does not go away with rest.   You have an attack of chest pain lasting longer than usual, despite rest and treatment with the medications your caregiver has prescribed.   You wake from sleep with chest pain or shortness of breath.  You feel dizzy or faint.  You have chest pain not typical of your usual pain for which you originally saw your caregiver.   You have any other emergent concerns regarding your health.  Additional Information: Chest pain comes from many different causes. Your caregiver has diagnosed you as having chest pain that is not specific for one problem, but does not require admission.  You are at low risk for an acute heart condition or other serious illness.   Your vital signs today were: BP 131/72 (BP Location: Right Arm)    Pulse 72    Temp 98.6 F (37 C) (Oral)    Resp 14    Ht 5\' 5"  (1.651 m)    Wt 104.8 kg (231 lb)    SpO2  95%    BMI 38.44 kg/m  If your blood pressure (BP) was elevated above 135/85 this visit, please have this repeated by your doctor within one month. --------------

## 2017-06-26 NOTE — ED Triage Notes (Addendum)
Abscess to right groin area and palpations while in waiting room and having chest pressure . Was seen by Cardiologist on 4/.4 for clearance for surgery

## 2017-06-26 NOTE — ED Notes (Signed)
gingerale and snack given with verbal approval from [place], [place]

## 2017-06-26 NOTE — ED Provider Notes (Signed)
[facility] EMERGENCY DEPARTMENT Provider Note   CSN: [id number] Arrival date & time: 06/26/17  1114     History   Chief Complaint Chief Complaint  Patient presents with  . Abscess    chest pain     HPI [name] is a 62 y.o. female.  Patient with history of hypertension, high cholesterol, diabetes, no previous ACS history, history of atrial fibrillation status post ablation, DVT on Coumadin --presents with complaints of pelvis abscess, first noticed yesterday.  No drainage or fever.  She has not had one this big before.  Area is tender.  In addition, patient reports chest pain and palpitations.  She has been getting these intermittently for about 1 week.  Last episode was just prior to arrival here.  Patient describes sensation of heart racing with associated left-sided chest pain with radiation to the shoulder and neck.  No associated diaphoresis, shortness of breath, vomiting.  Episodes will last for about an hour and then resolved.  Symptoms are currently improved.     Past Medical History:  Diagnosis Date  . Allergic rhinitis   . Arthritis    "back" (05/07/2016)  . Asthma   . Atrial flutter ([place])    a. Remotely per notes.  . Carpal tunnel syndrome, bilateral   . Colon polyps   . Diverticulitis   . DM (diabetes mellitus) ([place])   . DVT (deep venous thrombosis) ([place]) 1980s x2 -    between birth of two sons. Saw hematology - was told she has a hypercoagulable disorder, should be on Coumadin lifelong.  . Family history of adverse reaction to anesthesia    "mother gets PONV"  . Gallbladder disease   . GERD (gastroesophageal reflux disease)   . Halothane adverse reaction    narrow small opening  . Heart murmur   . Hiatal hernia   . History of hiatal hernia    "small one/CTA 05/01/2016" (05/07/2016)  . HTN (hypertension)   . Hypercholesteremia    hx; "brought it down w/diet" (05/07/2016)  . Hyperglycemia    a. A1c 6.1 in 2014.  . Inguinal hernia   .  Left sided sciatica   . Normal cardiac stress test 06/2012  . Obesity   . OSA (obstructive sleep apnea)    a. Mild, did not tolerate CPAP (05/07/2016)  . PAF (paroxysmal atrial fibrillation) ([place])    a. s/p afib ablation at [facility] 2010 (Dr. Annabell Howells). b. Recurrent AF s/p DCCV 06/2010. c. On flecainide.   Marland Kitchen PONV (postoperative nausea and vomiting)   . PPD positive, treated 1977   "treated for 1 yr after exposure to patient"  . RSV infection ~ 2006  . Wandering (atrial) pacemaker    a. Remotely per notes.    Patient Active Problem List   Diagnosis Date Noted  . Diverticulitis of colon 11/24/2016  . A-fib ([place]) 05/07/2016  . Obesity 01/10/2014  . Sleep apnea 01/10/2014  . Encounter for therapeutic drug monitoring 04/26/2013  . Essential hypertension 03/01/2013  . History of DVT (deep vein thrombosis) 01/12/2013  . Hyperglycemia 01/12/2013  . Chest pain 06/15/2012  . Shortness of breath 06/15/2012  . Dizziness 07/09/2010  . HYPERCHOLESTEROLEMIA 09/10/2009  . CARPAL TUNNEL SYNDROME 09/10/2009  . Asthma 09/10/2009  . TMJ SYNDROME 09/10/2009  . GERD 09/10/2009  . GALLBLADDER DISEASE 09/10/2009  . Paroxysmal atrial fibrillation ([place]) 09/09/2009    Past Surgical History:  Procedure Laterality Date  . ATRIAL ABLATION SURGERY  05/07/2016   "fib and  flutter"  . ATRIAL FIBRILLATION ABLATION  2010   at [facility]  . ATRIAL FIBRILLATION ABLATION N/A 05/07/2016   Procedure: Atrial Fibrillation Ablation;  Surgeon: Thompson Grayer, MD;  Location: [facility];  Service: Cardiovascular;  Laterality: N/A;  . BREAST CYST ASPIRATION Bilateral   . CARDIAC CATHETERIZATION  2008  . CARPAL TUNNEL RELEASE Bilateral   . CESAREAN SECTION  1986; 1988  . COLONOSCOPY  X 2  . COLONOSCOPY W/ BIOPSIES AND POLYPECTOMY  X 2  . ESOPHAGOGASTRODUODENOSCOPY    . INGUINAL HERNIA REPAIR Right   . LAPAROSCOPIC CHOLECYSTECTOMY  1989  . TMJ ARTHROPLASTY    . TUBAL LIGATION  1988     OB History     None      Home Medications    Prior to Admission medications   Medication Sig Start Date End Date Taking? Authorizing Provider  acetaminophen (TYLENOL) 650 MG CR tablet Take 1,300 mg by mouth 2 (two) times daily.    [provider]  cetirizine (ZYRTEC) 10 MG tablet Take 10 mg by mouth daily.      [provider]  Cholecalciferol (VITAMIN D3) 1000 UNITS CAPS Take 2,000 Units by mouth daily.     [provider]  dicyclomine (BENTYL) 10 MG capsule Take 1 tablet by mouth 30 min before meals and at bedtime as needed for pain, cramping. 11/24/16   Esterwood, Amy S, PA-C  enoxaparin (LOVENOX) 100 MG/ML injection Inject 1 mL (100 mg total) into the skin every 12 (twelve) hours. 06/07/17   Allred, Jeneen Rinks, MD  FLOVENT HFA 220 MCG/ACT inhaler Inhale 1 puff into the lungs 2 (two) times daily. 04/20/16   [provider]  fluticasone (FLONASE) 50 MCG/ACT nasal spray Place 1 spray into the nose 2 (two) times daily.  06/02/12   [provider]  losartan (COZAAR) 25 MG tablet Take 1 tablet (25 mg total) by mouth daily. 12/20/13   Allred, Jeneen Rinks, MD  Multiple Vitamin (MULTIVITAMIN WITH MINERALS) TABS tablet Take 1 tablet by mouth daily.    [provider]  pantoprazole (PROTONIX) 40 MG tablet Take 1 tablet (40 mg total) by mouth 2 (two) times daily. 06/17/17   Armbruster, Carlota Raspberry, MD  polyethylene glycol [facility]) packet Take 17 g by mouth daily.    [provider]  PROAIR HFA 108 (90 BASE) MCG/ACT inhaler Inhale 2 puffs into the lungs every 6 (six) hours as needed for wheezing or shortness of breath.  03/22/12   [provider]  sucralfate (CARAFATE) 1 GM/10ML suspension Take 10 mLs (1 g total) by mouth every 6 (six) hours as needed. 06/15/17   Yetta Flock, MD  venlafaxine XR (EFFEXOR-XR) 37.5 MG 24 hr capsule Take 37.5 mg by mouth daily with breakfast.  11/21/12   [provider]  verapamil (CALAN) 80 MG tablet Take 160 mg by  mouth 2 (two) times daily.    [provider]  warfarin (COUMADIN) 2 MG tablet TAKE AS DIRECTED BY COUMADIN CLINIC 04/14/17   Thompson Grayer, MD    Family History Family History  Problem Relation Age of Onset  . Stroke Mother   . Heart attack Mother        2 previous MIs, 3 stents - CAD first diagnosed 33  . Thyroid disease Mother   . Colon polyps Father   . Lupus Sister   . Heart attack Paternal Grandfather        Died at 69 of massive MI  .  Stroke Maternal Grandfather        Multiple family members  . Stroke Paternal Grandmother   . Thyroid disease Sister        x 2  . Colon cancer Neg Hx   . Stomach cancer Neg Hx     Social History Social History   Tobacco Use  . Smoking status: Never Smoker  . Smokeless tobacco: Never Used  Substance Use Topics  . Alcohol use: Yes    Comment: 05/07/2016   . Drug use: No     Allergies   Aspirin; Ivp dye [iodinated diagnostic agents]; Latex; Meperidine hcl; Penicillins; Statins; Sulfonamide derivatives; and Food   Review of Systems Review of Systems  Constitutional: Negative for diaphoresis and fever.  Eyes: Negative for redness.  Respiratory: Negative for cough and shortness of breath.   Cardiovascular: Positive for chest pain and palpitations. Negative for leg swelling.  Gastrointestinal: Negative for abdominal pain, nausea and vomiting.  Genitourinary: Negative for dysuria.  Musculoskeletal: Negative for back pain and neck pain.  Skin: Positive for color change. Negative for rash.       Positive for abscess.  Neurological: Negative for syncope and light-headedness.  Hematological: Negative for adenopathy.  Psychiatric/Behavioral: The patient is not nervous/anxious.      Physical Exam Updated Vital Signs BP 138/84 (BP Location: Left Arm)   Pulse 98   Temp 99.1 F (37.3 C) (Oral)   Resp 18   Ht 5\' 5"  (1.651 m)   Wt 104.8 kg (231 lb)   SpO2 98%   BMI 38.44 kg/m   Physical Exam  Constitutional: She  appears well-developed and well-nourished.  HENT:  Head: Normocephalic and atraumatic.  Mouth/Throat: Mucous membranes are normal. Mucous membranes are not dry.  Eyes: Conjunctivae are normal.  Neck: Trachea normal and normal range of motion. Neck supple. Normal carotid pulses and no JVD present. No muscular tenderness present. Carotid bruit is not present. No tracheal deviation present.  Cardiovascular: Normal rate, regular rhythm, S1 normal, S2 normal, normal heart sounds and intact distal pulses. Exam reveals no decreased pulses.  No murmur heard. Pulmonary/Chest: Effort normal. No respiratory distress. She has no wheezes. She exhibits no tenderness.  Abdominal: Soft. Normal aorta and bowel sounds are normal. There is no tenderness. There is no rebound and no guarding.  Musculoskeletal: Normal range of motion.  Neurological: She is alert.  Skin: Skin is warm and dry. She is not diaphoretic. No cyanosis. No pallor.  Patient with a 1 cm area of induration over the pelvis area consistent with small abscess.  No significant surrounding cellulitis.  No active drainage.  Psychiatric: She has a normal mood and affect.  Nursing note and vitals reviewed.    ED Treatments / Results  Labs (all labs ordered are listed, but only abnormal results are displayed) Labs Reviewed  BASIC METABOLIC PANEL - Abnormal; Notable for the following components:      Result Value   Potassium 3.3 (*)    CO2 21 (*)    Glucose, Bld 183 (*)    All other components within normal limits  CBC  TROPONIN I    EKG EKG Interpretation  Date/Time:  Saturday June 26 2017 11:32:18 EDT Ventricular Rate:  99 PR Interval:  140 QRS Duration: 74 QT Interval:  358 QTC Calculation: 459 R Axis:   1 Text Interpretation:  Normal sinus rhythm Anterior infarct , age undetermined Abnormal ECG When compared to prior, no significant changes seen.  no STEMI Confirmed by Tegeler,  Gerald Stabs [phone]) on 06/26/2017 1:35:15  PM   Radiology Dg Chest 2 View  Result Date: 06/26/2017 CLINICAL DATA:  One-week history of palpitations, chest pain and shortness of breath. History of atrial fibrillation with prior ablation procedure. EXAM: CHEST - 2 VIEW COMPARISON:  06/18/2017, 07/08/2015 and earlier, including cardiac CT for morphology of the pulmonary veins on 05/01/2016. FINDINGS: Cardiomediastinal silhouette unremarkable, unchanged. Lungs clear. Bronchovascular markings normal. Pulmonary vascularity normal. No visible pleural effusions. No pneumothorax. Degenerative changes involving the thoracic spine. IMPRESSION: No acute cardiopulmonary disease. Electronically Signed   By: Evangeline Dakin M.D.   On: 06/26/2017 13:03    Procedures .Marland KitchenIncision and Drainage Date/Time: 06/26/2017 2:16 PM Performed by: Carlisle Cater, PA-C Authorized by: Carlisle Cater, PA-C   Consent:    Consent obtained:  Verbal   Consent given by:  Patient   Risks discussed:  Bleeding and infection   Alternatives discussed:  No treatment Location:    Type:  Abscess   Location:  Trunk   Trunk location:  Abdomen Pre-procedure details:    Skin preparation:  Betadine Anesthesia (see MAR for exact dosages):    Anesthesia method:  Local infiltration   Local anesthetic:  Lidocaine 2% WITH epi Procedure details:    Incision types:  Stab incision   Scalpel blade:  11   Wound management:  Probed and deloculated   Drainage:  Purulent   Drainage amount:  Moderate   Wound treatment:  Wound left open   Packing materials:  None Post-procedure details:    Patient tolerance of procedure:  Tolerated well, no immediate complications   (including critical care time)  Medications Ordered in ED Medications  lidocaine-EPINEPHrine (XYLOCAINE W/EPI) 2 %-1:200000 (PF) injection 10 mL (has no administration in time range)      Initial Impression / Assessment and Plan / ED Course  I have reviewed the triage vital signs and the nursing notes.  Pertinent  labs & imaging results that were available during my care of the patient were reviewed by me and considered in my medical decision making (see chart for details).     Patient seen and examined. EKG reviewed. Work-up initiated. Medications ordered.   Vital signs reviewed and are as follows: BP 138/84 (BP Location: Left Arm)   Pulse 98   Temp 99.1 F (37.3 C) (Oral)   Resp 18   Ht 5\' 5"  (1.651 m)   Wt 104.8 kg (231 lb)   SpO2 98%   BMI 38.44 kg/m   2:16 PM patient updated on results to this point.  Will need delta troponin in approximately 1 hour.  Symptoms remain controlled.  She tolerated incision and drainage without complication.  5:06 PM patient updated on repeat troponin results.  We will discharged home at this time.  Repeat EKG reviewed with Dr. Sherry Ruffing.  Encourage patient to follow-up with her primary care doctor in the upcoming week regarding her episodes of chest pain.   EKG Interpretation  Date/Time:  Saturday June 26 2017 15:51:33 EDT Ventricular Rate:  73 PR Interval:  140 QRS Duration: 94 QT Interval:  421 QTC Calculation: 464 R Axis:   -10 Text Interpretation:  Sinus or ectopic atrial rhythm Low voltage, precordial leads When compared to prior, no changes seen.  No STEMI Confirmed by Antony Blackbird [phone]) on 06/26/2017 4:44:19 PM      5:07 PM Patient was counseled to return with severe chest pain, especially if the pain is crushing or pressure-like and spreads to the arms, back, neck, or  jaw, or if they have sweating, nausea, or shortness of breath with the pain. They were encouraged to call 911 with these symptoms.   They were also told to return if their chest pain gets worse and does not go away with rest, they have an attack of chest pain lasting longer than usual despite rest and treatment with the medications their caregiver has prescribed, if they wake from sleep with chest pain or shortness of breath, if they feel dizzy or faint, if they have chest pain not  typical of their usual pain, or if they have any other emergent concerns regarding their health.  The patient verbalized understanding and agreed.    Final Clinical Impressions(s) / ED Diagnoses   Final diagnoses:  Infected sebaceous cyst of skin  Chest pain, unspecified type   Infected sebaceous cyst of skin: I&D performed without complication.  No significant associated cellulitis, do not feel antibiotics indicated at this time.  Patient is not diabetic.  Patient with atypical chest pain and palpitations.  Symptoms are not exertional.  She does not have diaphoresis or vomiting.  Troponin negative x2.  EKG without ischemic findings or acute changes during emergency department stay.  Do not feel that she requires admission for workup today.  She will follow-up with her PCP regarding this. HEART = 3. Seems reliable to return with worsening or persistent symptoms.  ED Discharge Orders    None       Carlisle Cater, [place] 06/26/17 1708    Tegeler, Gwenyth Allegra, MD 06/26/17 [phone]

## 2017-06-26 NOTE — ED Notes (Signed)
PA Geiple has lidocaine for administration for abscess

## 2017-06-26 NOTE — ED Notes (Signed)
ED Provider at bedside. 

## 2017-06-26 NOTE — ED Notes (Signed)
Patient transported to X-ray 

## 2017-06-28 ENCOUNTER — Encounter: Payer: Self-pay | Admitting: Gastroenterology

## 2017-07-05 ENCOUNTER — Other Ambulatory Visit: Payer: Self-pay

## 2017-07-05 ENCOUNTER — Telehealth: Payer: Self-pay

## 2017-07-05 ENCOUNTER — Telehealth: Payer: Self-pay | Admitting: Gastroenterology

## 2017-07-05 NOTE — Telephone Encounter (Signed)
Note   Previously routed on 06/23/17, have not heard back on pharmacy clearance. Thank you.   [facility] Pre-operative Risk Assessment    Request for surgical clearance:     Endoscopy Procedure  What type of surgery is being performed?     EGD with polypectomy at hospital  When is this surgery scheduled?     07/19/17  What type of clearance is required ?   Pharmacy  Are there any medications that need to be held prior to surgery and how long? Coumadin 5 days, with a lovenox bridge  Practice name and name of physician performing surgery?      [facility], Dr. Havery Moros  What is your office phone and fax number?      Phone- [phone]  [phone]  Anesthesia type (None, local, MAC, general) ?       MAC

## 2017-07-05 NOTE — Telephone Encounter (Signed)
Left message for patient that I have not heard back from clearance pharmacy about holding coumadin and lovenox bridge. I have resent the requested information again, waiting on them in order to mail patient her prep instructions.

## 2017-07-05 NOTE — Telephone Encounter (Signed)
Patient with diagnosis of Afib on warfarin for anticoagulation.  She was also diagnosed with hypercoagulable state after recurrent DVT.   Procedure: colonoscopy Date of procedure: 04/06/17  CHADS2-VASc score of  At least 2 (CHF, HTN, AGE, DM2, stroke/tia x 2, CAD, AGE, female)  CrCl 167mL/min  Per office protocol, patient can hold warfarin for 5 days prior to procedure.   Patient will need bridging with Lovenox (enoxaparin) around procedure.

## 2017-07-05 NOTE — Telephone Encounter (Signed)
Pt states she has an appt with Coumadin clinic on 07/13/17 states this will be the last day she is supposes to take coumadin.

## 2017-07-06 ENCOUNTER — Encounter: Payer: Self-pay | Admitting: Gastroenterology

## 2017-07-06 NOTE — Telephone Encounter (Signed)
Mailed patient prep instructions.

## 2017-07-06 NOTE — Telephone Encounter (Signed)
   Patient has an appointment 07/13/2017 with the Coumadin clinic.  She needs Lovenox and this will be taken care of at that appointment.  That is the last day she is supposed to take her Coumadin prior to her surgery on 07/19/2017.  Rosaria Ferries, PA-C 07/06/2017 5:01 PM Beeper [phone]

## 2017-07-07 ENCOUNTER — Emergency Department (HOSPITAL_COMMUNITY): Payer: 59

## 2017-07-07 ENCOUNTER — Encounter (HOSPITAL_COMMUNITY): Payer: Self-pay

## 2017-07-07 ENCOUNTER — Observation Stay (HOSPITAL_COMMUNITY)
Admission: EM | Admit: 2017-07-07 | Discharge: 2017-07-08 | Disposition: A | Discharge status: Home / Self Care | Payer: 59 | Attending: Internal Medicine | Admitting: Internal Medicine

## 2017-07-07 ENCOUNTER — Other Ambulatory Visit: Payer: Self-pay

## 2017-07-07 DIAGNOSIS — E876 Hypokalemia: Secondary | ICD-10-CM | POA: Diagnosis not present

## 2017-07-07 DIAGNOSIS — Z6838 Body mass index (BMI) 38.0-38.9, adult: Secondary | ICD-10-CM | POA: Diagnosis not present

## 2017-07-07 DIAGNOSIS — J45909 Unspecified asthma, uncomplicated: Secondary | ICD-10-CM | POA: Diagnosis not present

## 2017-07-07 DIAGNOSIS — E669 Obesity, unspecified: Secondary | ICD-10-CM | POA: Insufficient documentation

## 2017-07-07 DIAGNOSIS — Z79899 Other long term (current) drug therapy: Secondary | ICD-10-CM | POA: Insufficient documentation

## 2017-07-07 DIAGNOSIS — Z91010 Allergy to peanuts: Secondary | ICD-10-CM | POA: Insufficient documentation

## 2017-07-07 DIAGNOSIS — K219 Gastro-esophageal reflux disease without esophagitis: Secondary | ICD-10-CM | POA: Diagnosis present

## 2017-07-07 DIAGNOSIS — Z886 Allergy status to analgesic agent status: Secondary | ICD-10-CM | POA: Diagnosis not present

## 2017-07-07 DIAGNOSIS — R112 Nausea with vomiting, unspecified: Secondary | ICD-10-CM | POA: Insufficient documentation

## 2017-07-07 DIAGNOSIS — Z91041 Radiographic dye allergy status: Secondary | ICD-10-CM | POA: Insufficient documentation

## 2017-07-07 DIAGNOSIS — S199XXA Unspecified injury of neck, initial encounter: Secondary | ICD-10-CM | POA: Diagnosis not present

## 2017-07-07 DIAGNOSIS — R42 Dizziness and giddiness: Secondary | ICD-10-CM | POA: Insufficient documentation

## 2017-07-07 DIAGNOSIS — S0990XA Unspecified injury of head, initial encounter: Secondary | ICD-10-CM | POA: Diagnosis not present

## 2017-07-07 DIAGNOSIS — S0003XA Contusion of scalp, initial encounter: Secondary | ICD-10-CM | POA: Diagnosis not present

## 2017-07-07 DIAGNOSIS — E119 Type 2 diabetes mellitus without complications: Secondary | ICD-10-CM | POA: Diagnosis not present

## 2017-07-07 DIAGNOSIS — Z880 Allergy status to penicillin: Secondary | ICD-10-CM | POA: Diagnosis not present

## 2017-07-07 DIAGNOSIS — Z86718 Personal history of other venous thrombosis and embolism: Secondary | ICD-10-CM

## 2017-07-07 DIAGNOSIS — I10 Essential (primary) hypertension: Secondary | ICD-10-CM | POA: Diagnosis not present

## 2017-07-07 DIAGNOSIS — Z7901 Long term (current) use of anticoagulants: Secondary | ICD-10-CM | POA: Insufficient documentation

## 2017-07-07 DIAGNOSIS — Z91018 Allergy to other foods: Secondary | ICD-10-CM | POA: Insufficient documentation

## 2017-07-07 DIAGNOSIS — S098XXA Other specified injuries of head, initial encounter: Secondary | ICD-10-CM | POA: Diagnosis not present

## 2017-07-07 DIAGNOSIS — W19XXXA Unspecified fall, initial encounter: Secondary | ICD-10-CM | POA: Diagnosis present

## 2017-07-07 DIAGNOSIS — Z91040 Latex allergy status: Secondary | ICD-10-CM | POA: Insufficient documentation

## 2017-07-07 DIAGNOSIS — G44209 Tension-type headache, unspecified, not intractable: Secondary | ICD-10-CM | POA: Diagnosis not present

## 2017-07-07 DIAGNOSIS — S0101XA Laceration without foreign body of scalp, initial encounter: Secondary | ICD-10-CM | POA: Diagnosis not present

## 2017-07-07 DIAGNOSIS — Z888 Allergy status to other drugs, medicaments and biological substances status: Secondary | ICD-10-CM | POA: Diagnosis not present

## 2017-07-07 DIAGNOSIS — I480 Paroxysmal atrial fibrillation: Secondary | ICD-10-CM | POA: Diagnosis present

## 2017-07-07 DIAGNOSIS — E785 Hyperlipidemia, unspecified: Secondary | ICD-10-CM | POA: Insufficient documentation

## 2017-07-07 DIAGNOSIS — W11XXXA Fall on and from ladder, initial encounter: Secondary | ICD-10-CM | POA: Insufficient documentation

## 2017-07-07 DIAGNOSIS — M47812 Spondylosis without myelopathy or radiculopathy, cervical region: Secondary | ICD-10-CM | POA: Insufficient documentation

## 2017-07-07 DIAGNOSIS — G4733 Obstructive sleep apnea (adult) (pediatric): Secondary | ICD-10-CM | POA: Diagnosis not present

## 2017-07-07 DIAGNOSIS — Z882 Allergy status to sulfonamides status: Secondary | ICD-10-CM | POA: Diagnosis not present

## 2017-07-07 DIAGNOSIS — Z86010 Personal history of colonic polyps: Secondary | ICD-10-CM | POA: Insufficient documentation

## 2017-07-07 LAB — CBC WITH DIFFERENTIAL/PLATELET
Basophils Absolute: 0 10*3/uL (ref 0.0–0.1)
Basophils Relative: 0 %
EOS ABS: 0 10*3/uL (ref 0.0–0.7)
EOS PCT: 0 %
HCT: 38.8 % (ref 36.0–46.0)
Hemoglobin: 12.8 g/dL (ref 12.0–15.0)
LYMPHS ABS: 2.5 10*3/uL (ref 0.7–4.0)
Lymphocytes Relative: 18 %
MCH: 29.6 pg (ref 26.0–34.0)
MCHC: 33 g/dL (ref 30.0–36.0)
MCV: 89.8 fL (ref 78.0–100.0)
MONO ABS: 1 10*3/uL (ref 0.1–1.0)
MONOS PCT: 7 %
Neutro Abs: 10.8 10*3/uL — ABNORMAL HIGH (ref 1.7–7.7)
Neutrophils Relative %: 75 %
PLATELETS: 272 10*3/uL (ref 150–400)
RBC: 4.32 MIL/uL (ref 3.87–5.11)
RDW: 13.8 % (ref 11.5–15.5)
WBC: 14.3 10*3/uL — AB (ref 4.0–10.5)

## 2017-07-07 LAB — COMPREHENSIVE METABOLIC PANEL
ALT: 32 U/L (ref 14–54)
AST: 33 U/L (ref 15–41)
Albumin: 3.7 g/dL (ref 3.5–5.0)
Alkaline Phosphatase: 75 U/L (ref 38–126)
Anion gap: 13 (ref 5–15)
BUN: 8 mg/dL (ref 6–20)
CHLORIDE: 102 mmol/L (ref 101–111)
CO2: 23 mmol/L (ref 22–32)
CREATININE: 0.82 mg/dL (ref 0.44–1.00)
Calcium: 9.4 mg/dL (ref 8.9–10.3)
GFR calc Af Amer: >60 mL/min (ref >60)
GLUCOSE: 153 mg/dL — AB (ref 65–99)
Potassium: 3.4 mmol/L — ABNORMAL LOW (ref 3.5–5.1)
Sodium: 138 mmol/L (ref 135–145)
Total Bilirubin: 0.5 mg/dL (ref 0.3–1.2)
Total Protein: 6.5 g/dL (ref 6.5–8.1)

## 2017-07-07 LAB — PROTIME-INR
INR: 3.56
PROTHROMBIN TIME: 35.3 s — AB (ref 11.4–15.2)

## 2017-07-07 MED ORDER — LIDOCAINE-EPINEPHRINE (PF) 2 %-1:200000 IJ SOLN
10.0000 mL | Freq: Once | INTRAMUSCULAR | Status: AC
Start: 2017-07-07 — End: 2017-07-08
  Filled 2017-07-07: qty 20

## 2017-07-07 MED ORDER — ONDANSETRON HCL 4 MG/2ML IJ SOLN
4.0000 mg | Freq: Once | INTRAMUSCULAR | Status: AC
  Administered 2017-07-07: 4 mg via INTRAVENOUS
  Filled 2017-07-07: qty 2

## 2017-07-07 MED ORDER — LIDOCAINE-EPINEPHRINE (PF) 2 %-1:200000 IJ SOLN
20.0000 mL | Freq: Once | INTRAMUSCULAR | Status: AC
  Administered 2017-07-08: 20 mL
  Filled 2017-07-07: qty 20

## 2017-07-07 MED ORDER — LIDOCAINE-EPINEPHRINE (PF) 2 %-1:200000 IJ SOLN
20.0000 mL | Freq: Once | INTRAMUSCULAR | Status: AC
  Administered 2017-07-07: 20 mL
  Filled 2017-07-07: qty 20

## 2017-07-07 NOTE — ED Notes (Addendum)
77mins after pressure dressing applied, bleeding on back of head is not controlled. MD bedside at this time, orders received

## 2017-07-07 NOTE — ED Triage Notes (Signed)
Pt arrives to ED from home with complaints of falling from approx 6 foot ladder since this afternoon. EMS reports pt fell down to her feet, onto her butt, and hit her head on concrete. pt had no LOC, had n/v zofran given en route. Pt is alert and oriented, hx of afib on coumadin. Bleeding controlled upon arrival. Pt placed in position of comfort with bed locked and lowered, call bell in reach.

## 2017-07-07 NOTE — ED Provider Notes (Signed)
[facility] EMERGENCY DEPARTMENT Provider Note   CSN: [id number] Arrival date & time: 07/07/17  1925     History   Chief Complaint Chief Complaint  Patient presents with  . Head Injury  . Fall    HPI [name] is a 62 y.o. female  With a history of anaphylaxis to IV contrast, DM, DVT, A. fib on Coumadin ,presents today for evaluation after a fall.  She reports that she was on a approximately 6 foot ladder and's to step.  She reports that she fell approximately 6 feet landing on her bilateral feet, onto her back, and then hit her head on the concrete.  She reportedly had no loss of consciousness, however had nausea with vomiting and was given Zofran in route.  Reports pain in her head.  She says her pain is worse in the back of her head where she has a wound.   HPI  Past Medical History:  Diagnosis Date  . Allergic rhinitis   . Arthritis    "back" (05/07/2016)  . Asthma   . Atrial flutter ([place])    a. Remotely per notes.  . Carpal tunnel syndrome, bilateral   . Colon polyps   . Diverticulitis   . DM (diabetes mellitus) ([place])   . DVT (deep venous thrombosis) ([place]) 1980s x2 -    between birth of two sons. Saw hematology - was told she has a hypercoagulable disorder, should be on Coumadin lifelong.  . Family history of adverse reaction to anesthesia    "mother gets PONV"  . Gallbladder disease   . GERD (gastroesophageal reflux disease)   . Halothane adverse reaction    narrow small opening  . Heart murmur   . Hiatal hernia   . History of hiatal hernia    "small one/CTA 05/01/2016" (05/07/2016)  . HTN (hypertension)   . Hypercholesteremia    hx; "brought it down w/diet" (05/07/2016)  . Hyperglycemia    a. A1c 6.1 in 2014.  . Inguinal hernia   . Left sided sciatica   . Normal cardiac stress test 06/2012  . Obesity   . OSA (obstructive sleep apnea)    a. Mild, did not tolerate CPAP (05/07/2016)  . PAF (paroxysmal atrial fibrillation) ([place])    a. s/p  afib ablation at [facility] 2010 (Dr. Annabell Howells). b. Recurrent AF s/p DCCV 06/2010. c. On flecainide.   Marland Kitchen PONV (postoperative nausea and vomiting)   . PPD positive, treated 1977   "treated for 1 yr after exposure to patient"  . RSV infection ~ 2006  . Wandering (atrial) pacemaker    a. Remotely per notes.    Patient Active Problem List   Diagnosis Date Noted  . Diverticulitis of colon 11/24/2016  . A-fib ([place]) 05/07/2016  . Obesity 01/10/2014  . Sleep apnea 01/10/2014  . Encounter for therapeutic drug monitoring 04/26/2013  . Essential hypertension 03/01/2013  . History of DVT (deep vein thrombosis) 01/12/2013  . Hyperglycemia 01/12/2013  . Chest pain 06/15/2012  . Shortness of breath 06/15/2012  . Dizziness 07/09/2010  . HYPERCHOLESTEROLEMIA 09/10/2009  . CARPAL TUNNEL SYNDROME 09/10/2009  . Asthma 09/10/2009  . TMJ SYNDROME 09/10/2009  . GERD 09/10/2009  . GALLBLADDER DISEASE 09/10/2009  . Paroxysmal atrial fibrillation ([place]) 09/09/2009    Past Surgical History:  Procedure Laterality Date  . ATRIAL ABLATION SURGERY  05/07/2016   "fib and flutter"  . ATRIAL FIBRILLATION ABLATION  2010   at [facility]  . ATRIAL FIBRILLATION ABLATION  N/A 05/07/2016   Procedure: Atrial Fibrillation Ablation;  Surgeon: Thompson Grayer, MD;  Location: [facility] CV LAB;  Service: Cardiovascular;  Laterality: N/A;  . BREAST CYST ASPIRATION Bilateral   . CARDIAC CATHETERIZATION  2008  . CARPAL TUNNEL RELEASE Bilateral   . CESAREAN SECTION  1986; 1988  . COLONOSCOPY  X 2  . COLONOSCOPY W/ BIOPSIES AND POLYPECTOMY  X 2  . ESOPHAGOGASTRODUODENOSCOPY    . INGUINAL HERNIA REPAIR Right   . LAPAROSCOPIC CHOLECYSTECTOMY  1989  . TMJ ARTHROPLASTY    . TUBAL LIGATION  1988     OB History   None      Home Medications    Prior to Admission medications   Medication Sig Start Date End Date Taking? Authorizing Provider  acetaminophen (TYLENOL) 650 MG CR tablet Take 1,300 mg by mouth 2 (two)  times daily.   Yes [provider]  cetirizine (ZYRTEC) 10 MG tablet Take 10 mg by mouth daily.     Yes [provider]  Cholecalciferol (VITAMIN D3) 1000 UNITS CAPS Take 2,000 Units by mouth daily.    Yes [provider]  dicyclomine (BENTYL) 10 MG capsule Take 1 tablet by mouth 30 min before meals and at bedtime as needed for pain, cramping. 11/24/16  Yes Esterwood, Amy S, PA-C  FLOVENT HFA 220 MCG/ACT inhaler Inhale 1 puff into the lungs 2 (two) times daily. 04/20/16  Yes [provider]  fluticasone (FLONASE) 50 MCG/ACT nasal spray Place 1 spray into both nostrils 2 (two) times daily.  06/02/12  Yes [provider]  losartan (COZAAR) 25 MG tablet Take 1 tablet (25 mg total) by mouth daily. 12/20/13  Yes Allred, Jeneen Rinks, MD  Multiple Vitamin (MULTIVITAMIN WITH MINERALS) TABS tablet Take 1 tablet by mouth daily.   Yes [provider]  omeprazole (PRILOSEC) 40 MG capsule Take 40 mg by mouth 2 (two) times daily.   Yes [provider]  polyethylene glycol (MIRALAX) packet Take 17 g by mouth every evening.    Yes [provider]  PROAIR HFA 108 (90 BASE) MCG/ACT inhaler Inhale 2 puffs into the lungs every 6 (six) hours as needed for wheezing or shortness of breath.  03/22/12  Yes [provider]  venlafaxine XR (EFFEXOR-XR) 37.5 MG 24 hr capsule Take 37.5 mg by mouth daily with breakfast.  11/21/12  Yes [provider]  verapamil (CALAN) 80 MG tablet Take 160 mg by mouth 2 (two) times daily.   Yes [provider]  warfarin (COUMADIN) 2 MG tablet TAKE AS DIRECTED BY COUMADIN CLINIC Patient taking differently: Take 4-6 mg by mouth See admin instructions. Take 6 mg by mouth in the morning on Sun/Tues/Thurs/Sat and 4 mg on Mon/Wed/Fri 04/14/17  Yes Allred, Jeneen Rinks, MD  enoxaparin (LOVENOX) 100 MG/ML injection Inject 1 mL (100 mg total) into the skin every 12 (twelve) hours. 06/07/17   Allred, Jeneen Rinks, MD  pantoprazole  (PROTONIX) 40 MG tablet Take 1 tablet (40 mg total) by mouth 2 (two) times daily. Patient not taking: Reported on 07/07/2017 06/17/17   Yetta Flock, MD  sucralfate (CARAFATE) 1 GM/10ML suspension Take 10 mLs (1 g total) by mouth every 6 (six) hours as needed. Patient taking differently: Take 1 g by mouth every 6 (six) hours as needed (for GERD symptoms).  06/15/17   Armbruster, Carlota Raspberry, MD    Family History Family History  Problem Relation Age of Onset  . Stroke Mother   . Heart attack Mother  2 previous MIs, 3 stents - CAD first diagnosed 14  . Thyroid disease Mother   . Colon polyps Father   . Lupus Sister   . Heart attack Paternal Grandfather        Died at 71 of massive MI  . Stroke Maternal Grandfather        Multiple family members  . Stroke Paternal Grandmother   . Thyroid disease Sister        x 2  . Colon cancer Neg Hx   . Stomach cancer Neg Hx     Social History Social History   Tobacco Use  . Smoking status: Never Smoker  . Smokeless tobacco: Never Used  Substance Use Topics  . Alcohol use: Yes    Comment: 05/07/2016   . Drug use: No     Allergies   Aspirin; Ivp dye [iodinated diagnostic agents]; Latex; Meperidine hcl; Penicillins; Statins; Sulfonamide derivatives; Banana; Food; Peanut-containing drug products; and Tape   Review of Systems Review of Systems  Constitutional: Negative for chills and fever.  Eyes: Negative for visual disturbance.  Respiratory: Negative for shortness of breath.   Cardiovascular: Negative for chest pain and palpitations.  Gastrointestinal: Positive for nausea and vomiting. Negative for abdominal pain.  Genitourinary: Negative for dysuria.  Musculoskeletal: Negative for back pain, neck pain and neck stiffness.  Skin: Positive for wound. Negative for color change.  Neurological: Positive for dizziness and headaches. Negative for syncope, speech difficulty, weakness and numbness.  Psychiatric/Behavioral: Negative  for confusion.  All other systems reviewed and are negative.    Physical Exam Updated Vital Signs BP (!) 130/58   Pulse 74   Temp 97.9 F (36.6 C) (Oral)   Resp 18   Ht 5\' 5"  (1.651 m)   Wt 104.8 kg (231 lb)   SpO2 96%   BMI 38.44 kg/m   Physical Exam  Constitutional: She is oriented to person, place, and time. She appears well-developed and well-nourished. No distress.  HENT:  Head: Normocephalic.  Mouth/Throat: Oropharynx is clear and moist.  Obvious wound to occiput.  When attempted to view wound there was brisk bleeding from the area.  Wound covered.  Eyes: Pupils are equal, round, and reactive to light. Conjunctivae are normal. Right eye exhibits no discharge. Left eye exhibits no discharge. No scleral icterus.  Neck:  ROM not tested.  Cardiovascular: Normal rate, regular rhythm, normal heart sounds and intact distal pulses.  Pulmonary/Chest: Effort normal and breath sounds normal. No stridor. No respiratory distress.  Abdominal: She exhibits no distension.  Musculoskeletal: She exhibits no edema or deformity.  C/T/L-spine palpated without step-offs, deformities, or localized tenderness. 5/5 strength bilateral plantar flexion and grip strength. Extremities palpated, all are soft, easily compressible with out localized TTP, crepitus or deformity.    Neurological: She is alert and oriented to person, place, and time. She exhibits normal muscle tone.  Skin: Skin is warm and dry. She is not diaphoretic.  Psychiatric: She has a normal mood and affect. Her behavior is normal.  Nursing note and vitals reviewed.    ED Treatments / Results  Labs (all labs ordered are listed, but only abnormal results are displayed) Labs Reviewed  COMPREHENSIVE METABOLIC PANEL - Abnormal; Notable for the following components:      Result Value   Potassium 3.4 (*)    Glucose, Bld 153 (*)    All other components within normal limits  CBC WITH DIFFERENTIAL/PLATELET - Abnormal; Notable for  the following components:   WBC 14.3 (*)  Neutro Abs 10.8 (*)    All other components within normal limits  PROTIME-INR - Abnormal; Notable for the following components:   Prothrombin Time 35.3 (*)    All other components within normal limits    EKG None  Radiology Ct Head Wo Contrast  Result Date: 07/07/2017 CLINICAL DATA:  Fall from ladder, head injury, posterior midline scalp hematoma close to the vertex. EXAM: CT HEAD WITHOUT CONTRAST CT CERVICAL SPINE WITHOUT CONTRAST TECHNIQUE: Multidetector CT imaging of the head and cervical spine was performed following the standard protocol without intravenous contrast. Multiplanar CT image reconstructions of the cervical spine were also generated. COMPARISON:  None. FINDINGS: CT HEAD FINDINGS Brain: No acute intracranial hemorrhage, mass lesion, acute infarction, midline shift, herniation, hydrocephalus, or extra-axial fluid collection. Normal gray-white matter differentiation. Cisterns are patent. No cerebellar abnormality. Vascular: No hyperdense vessel or unexpected calcification. Skull: Normal. Negative for fracture or focal lesion. Sinuses/Orbits: No acute finding. Other: Posterior vertex high scalp injury with hematoma of the soft tissues. CT CERVICAL SPINE FINDINGS Alignment: Normal. Skull base and vertebrae: No acute fracture. No primary bone lesion or focal pathologic process. Soft tissues and spinal canal: No prevertebral fluid or swelling. No visible canal hematoma. Disc levels: Degenerate spondylosis, most pronounced at C5-6 with disc space narrowing, sclerosis and osteophytes. Multilevel facet arthropathy posteriorly. No malalignment, subluxation, or dislocation. Degenerative changes of the C1-2 articulation. Upper chest: Negative. Other: None. IMPRESSION: Posterior vertex high scalp injury with hematoma No acute intracranial abnormality by noncontrast CT No acute cervical spine fracture or malalignment. Cervical degenerative spondylosis.  Electronically Signed   By: Jerilynn Mages.  Shick M.D.   On: 07/07/2017 20:39   Ct Cervical Spine Wo Contrast  Result Date: 07/07/2017 CLINICAL DATA:  Fall from ladder, head injury, posterior midline scalp hematoma close to the vertex. EXAM: CT HEAD WITHOUT CONTRAST CT CERVICAL SPINE WITHOUT CONTRAST TECHNIQUE: Multidetector CT imaging of the head and cervical spine was performed following the standard protocol without intravenous contrast. Multiplanar CT image reconstructions of the cervical spine were also generated. COMPARISON:  None. FINDINGS: CT HEAD FINDINGS Brain: No acute intracranial hemorrhage, mass lesion, acute infarction, midline shift, herniation, hydrocephalus, or extra-axial fluid collection. Normal gray-white matter differentiation. Cisterns are patent. No cerebellar abnormality. Vascular: No hyperdense vessel or unexpected calcification. Skull: Normal. Negative for fracture or focal lesion. Sinuses/Orbits: No acute finding. Other: Posterior vertex high scalp injury with hematoma of the soft tissues. CT CERVICAL SPINE FINDINGS Alignment: Normal. Skull base and vertebrae: No acute fracture. No primary bone lesion or focal pathologic process. Soft tissues and spinal canal: No prevertebral fluid or swelling. No visible canal hematoma. Disc levels: Degenerate spondylosis, most pronounced at C5-6 with disc space narrowing, sclerosis and osteophytes. Multilevel facet arthropathy posteriorly. No malalignment, subluxation, or dislocation. Degenerative changes of the C1-2 articulation. Upper chest: Negative. Other: None. IMPRESSION: Posterior vertex high scalp injury with hematoma No acute intracranial abnormality by noncontrast CT No acute cervical spine fracture or malalignment. Cervical degenerative spondylosis. Electronically Signed   By: Jerilynn Mages.  Shick M.D.   On: 07/07/2017 20:39    Procedures .Marland KitchenLaceration Repair Date/Time: 07/08/2017 12:46 AM Performed by: Lorin Glass, PA-C Authorized by: Lorin Glass, PA-C   Consent:    Consent obtained:  Verbal   Consent given by:  Patient   Risks discussed:  Infection, need for additional repair, poor cosmetic result, pain, retained foreign body, tendon damage, vascular damage, poor wound healing and nerve damage   Alternatives discussed:  No treatment and referral (  Alternative wound closures) Anesthesia (see MAR for exact dosages):    Anesthesia method:  Local infiltration   Local anesthetic:  Lidocaine 2% WITH epi Laceration details:    Location:  Scalp   Scalp location:  Occipital   Length (cm):  3 Repair type:    Repair type:  Intermediate Pre-procedure details:    Preparation:  Patient was prepped and draped in usual sterile fashion and imaging obtained to evaluate for foreign bodies Exploration:    Hemostasis achieved with:  Epinephrine and direct pressure Skin repair:    Repair method:  Sutures   Suture size:  4-0   Suture material:  Prolene   Suture technique:  Simple interrupted   Number of sutures:  1 Post-procedure details:    Dressing:  Bulky dressing Comments:     Bleeding still uncontrolled.  Large subcutaneous hematoma palpable.  Pressure dressing was applied for 90 minutes, wound continued to bleed.  Please see note by Dr. Tomi Bamberger for additional repair.   (including critical care time)  Medications Ordered in ED Medications  Tdap (BOOSTRIX) injection 0.5 mL (has no administration in time range)  sodium chloride 0.9 % bolus 500 mL (has no administration in time range)  ondansetron (ZOFRAN) injection 4 mg (4 mg Intravenous Given 07/07/17 2055)  lidocaine-EPINEPHrine (XYLOCAINE W/EPI) 2 %-1:200000 (PF) injection 20 mL (20 mLs Infiltration Given by Other 07/07/17 2152)  lidocaine-EPINEPHrine (XYLOCAINE W/EPI) 2 %-1:200000 (PF) injection 20 mL (20 mLs Infiltration Given by Other 07/08/17 0003)  lidocaine-EPINEPHrine (XYLOCAINE W/EPI) 2 %-1:200000 (PF) injection 10 mL (0 mLs Infiltration Return to [facility] 07/08/17 0056)       Initial Impression / Assessment and Plan / ED Course  I have reviewed the triage vital signs and the nursing notes.  Pertinent labs & imaging results that were available during my care of the patient were reviewed by me and considered in my medical decision making (see chart for details).  Clinical Course as of Jul 08 57  Wed Jul 07, 2017  2033 Patient nurse in room.  Labs being drawn.  Head wound re-wrapped.     [EH]  2144 Attempted to suture wound, is subcutaneous hematoma.  No obvious laceration found/felt.  Appears to be a small puncture in a hematoma causing pressure build up and    [EH]  Thu Jul 08, 2017  0055 Spoke with Dr. Blaine Hamper who will admit patient.    [EH]    Clinical Course User Index [EH] Lorin Glass, PA-C   Patient presents today for evaluation after a fall.  She was reportedly approximately 6 feet up on a ladder when she went to step down, missing her step landing on her feet causing her to fall onto her buttocks and then hit her head on concrete.  She is chronically anticoagulated with Coumadin for A. Fib.  Patient has obvious wound on the back of her head.  CT head and neck were obtained without acute abnormalities.  Labs were obtained, INR 3.56, slightly higher than her goal of 2-3.  Glucose mildly elevated at 153, white count 14.3.  Patient did not have any midline C/T/L spine pain.  No heal or leg pain.  There was difficulty in controlling her head bleeding and continued dizziness hospitalist was consulted for admission.  I spoke with Dr. Blaine Hamper who agreed to admit patient.    Final Clinical Impressions(s) / ED Diagnoses   Final diagnoses:  Injury of head, initial encounter  Fall, initial encounter  Laceration of scalp, initial  encounter    ED Discharge Orders    None       Ollen Gross 07/08/17 Lutricia Feil    Dorie Rank, MD 07/08/17 1154

## 2017-07-08 ENCOUNTER — Other Ambulatory Visit: Payer: Self-pay

## 2017-07-08 DIAGNOSIS — I480 Paroxysmal atrial fibrillation: Secondary | ICD-10-CM

## 2017-07-08 DIAGNOSIS — R112 Nausea with vomiting, unspecified: Secondary | ICD-10-CM | POA: Diagnosis not present

## 2017-07-08 DIAGNOSIS — Z86718 Personal history of other venous thrombosis and embolism: Secondary | ICD-10-CM | POA: Diagnosis not present

## 2017-07-08 DIAGNOSIS — I10 Essential (primary) hypertension: Secondary | ICD-10-CM

## 2017-07-08 DIAGNOSIS — K219 Gastro-esophageal reflux disease without esophagitis: Secondary | ICD-10-CM

## 2017-07-08 DIAGNOSIS — J45909 Unspecified asthma, uncomplicated: Secondary | ICD-10-CM

## 2017-07-08 DIAGNOSIS — E876 Hypokalemia: Secondary | ICD-10-CM | POA: Diagnosis not present

## 2017-07-08 DIAGNOSIS — W19XXXA Unspecified fall, initial encounter: Secondary | ICD-10-CM | POA: Diagnosis present

## 2017-07-08 DIAGNOSIS — S0990XA Unspecified injury of head, initial encounter: Secondary | ICD-10-CM

## 2017-07-08 DIAGNOSIS — S0101XA Laceration without foreign body of scalp, initial encounter: Secondary | ICD-10-CM | POA: Diagnosis not present

## 2017-07-08 DIAGNOSIS — E119 Type 2 diabetes mellitus without complications: Secondary | ICD-10-CM | POA: Diagnosis not present

## 2017-07-08 DIAGNOSIS — W19XXXS Unspecified fall, sequela: Secondary | ICD-10-CM

## 2017-07-08 DIAGNOSIS — R42 Dizziness and giddiness: Secondary | ICD-10-CM | POA: Diagnosis not present

## 2017-07-08 LAB — BASIC METABOLIC PANEL
Anion gap: 9 (ref 5–15)
BUN: 11 mg/dL (ref 6–20)
CHLORIDE: 104 mmol/L (ref 101–111)
CO2: 23 mmol/L (ref 22–32)
Calcium: 9.1 mg/dL (ref 8.9–10.3)
Creatinine, Ser: 0.75 mg/dL (ref 0.44–1.00)
GFR calc non Af Amer: >60 mL/min (ref >60)
Glucose, Bld: 134 mg/dL — ABNORMAL HIGH (ref 65–99)
POTASSIUM: 4.6 mmol/L (ref 3.5–5.1)
SODIUM: 136 mmol/L (ref 135–145)

## 2017-07-08 LAB — CBC
HEMATOCRIT: 36 % (ref 36.0–46.0)
Hemoglobin: 11.7 g/dL — ABNORMAL LOW (ref 12.0–15.0)
MCH: 29.2 pg (ref 26.0–34.0)
MCHC: 32.5 g/dL (ref 30.0–36.0)
MCV: 89.8 fL (ref 78.0–100.0)
Platelets: 286 10*3/uL (ref 150–400)
RBC: 4.01 MIL/uL (ref 3.87–5.11)
RDW: 14.1 % (ref 11.5–15.5)
WBC: 9.6 10*3/uL (ref 4.0–10.5)

## 2017-07-08 LAB — PROTIME-INR
INR: 3.03
Prothrombin Time: 31.1 seconds — ABNORMAL HIGH (ref 11.4–15.2)

## 2017-07-08 LAB — HIV ANTIBODY (ROUTINE TESTING W REFLEX): HIV Screen 4th Generation wRfx: NONREACTIVE

## 2017-07-08 MED ORDER — SODIUM CHLORIDE 0.9 % IV BOLUS
500.0000 mL | Freq: Once | INTRAVENOUS | Status: AC
  Administered 2017-07-08: 500 mL via INTRAVENOUS

## 2017-07-08 MED ORDER — ONDANSETRON HCL 4 MG/2ML IJ SOLN
4.0000 mg | Freq: Three times a day (TID) | INTRAMUSCULAR | Status: DC | PRN
  Administered 2017-07-08: 4 mg via INTRAVENOUS
  Filled 2017-07-08: qty 2

## 2017-07-08 MED ORDER — SUCRALFATE 1 GM/10ML PO SUSP: 1.0000 g | Freq: Four times a day (QID) | ORAL | Status: DC | PRN

## 2017-07-08 MED ORDER — ACETAMINOPHEN 325 MG PO TABS
650.0000 mg | ORAL_TABLET | Freq: Four times a day (QID) | ORAL | Status: DC | PRN
  Administered 2017-07-08 (×3): 650 mg via ORAL
  Filled 2017-07-08 (×3): qty 2

## 2017-07-08 MED ORDER — ZOLPIDEM TARTRATE 5 MG PO TABS
5.0000 mg | ORAL_TABLET | Freq: Every evening | ORAL | Status: DC | PRN
  Filled 2017-07-08: qty 1

## 2017-07-08 MED ORDER — LOSARTAN POTASSIUM 25 MG PO TABS
25.0000 mg | ORAL_TABLET | Freq: Every day | ORAL | Status: DC
  Administered 2017-07-08: 25 mg via ORAL
  Filled 2017-07-08: qty 1

## 2017-07-08 MED ORDER — POLYETHYLENE GLYCOL 3350 17 G PO PACK: 17.0000 g | PACK | Freq: Every evening | ORAL | Status: DC

## 2017-07-08 MED ORDER — VITAMIN D 1000 UNITS PO TABS
2000.0000 [IU] | ORAL_TABLET | Freq: Every day | ORAL | Status: DC
  Administered 2017-07-08: 2000 [IU] via ORAL
  Filled 2017-07-08 (×2): qty 2

## 2017-07-08 MED ORDER — DICYCLOMINE HCL 10 MG PO CAPS
10.0000 mg | ORAL_CAPSULE | Freq: Three times a day (TID) | ORAL | Status: DC
  Filled 2017-07-08 (×2): qty 1

## 2017-07-08 MED ORDER — BUDESONIDE 0.25 MG/2ML IN SUSP
0.2500 mg | Freq: Two times a day (BID) | RESPIRATORY_TRACT | Status: DC
  Administered 2017-07-08: 0.25 mg via RESPIRATORY_TRACT
  Filled 2017-07-08 (×2): qty 2

## 2017-07-08 MED ORDER — HYDRALAZINE HCL 20 MG/ML IJ SOLN: 5.0000 mg | INTRAMUSCULAR | Status: DC | PRN

## 2017-07-08 MED ORDER — LORATADINE 10 MG PO TABS
10.0000 mg | ORAL_TABLET | Freq: Every day | ORAL | Status: DC
Start: 2017-07-08 — End: 2017-07-08
  Administered 2017-07-08: 10 mg via ORAL
  Filled 2017-07-08 (×2): qty 1

## 2017-07-08 MED ORDER — ALBUTEROL SULFATE (2.5 MG/3ML) 0.083% IN NEBU: 3.0000 mL | INHALATION_SOLUTION | Freq: Four times a day (QID) | RESPIRATORY_TRACT | Status: DC | PRN

## 2017-07-08 MED ORDER — VERAPAMIL HCL 80 MG PO TABS
160.0000 mg | ORAL_TABLET | Freq: Two times a day (BID) | ORAL | Status: DC
  Administered 2017-07-08: 160 mg via ORAL
  Filled 2017-07-08 (×2): qty 2

## 2017-07-08 MED ORDER — DICYCLOMINE HCL 10 MG PO CAPS: 10.0000 mg | ORAL_CAPSULE | Freq: Every evening | ORAL | Status: DC | PRN

## 2017-07-08 MED ORDER — TETANUS-DIPHTH-ACELL PERTUSSIS 5-2.5-18.5 LF-MCG/0.5 IM SUSP
0.5000 mL | Freq: Once | INTRAMUSCULAR | Status: AC
  Administered 2017-07-08: 0.5 mL via INTRAMUSCULAR
  Filled 2017-07-08: qty 0.5

## 2017-07-08 MED ORDER — VERAPAMIL HCL 80 MG PO TABS
160.0000 mg | ORAL_TABLET | Freq: Two times a day (BID) | ORAL | Status: DC
  Administered 2017-07-08: 160 mg via ORAL
  Filled 2017-07-08: qty 2

## 2017-07-08 MED ORDER — WARFARIN - PHARMACIST DOSING INPATIENT: Freq: Every day | Status: DC

## 2017-07-08 MED ORDER — PANTOPRAZOLE SODIUM 40 MG PO TBEC
40.0000 mg | DELAYED_RELEASE_TABLET | Freq: Two times a day (BID) | ORAL | Status: DC
  Administered 2017-07-08 (×2): 40 mg via ORAL
  Filled 2017-07-08 (×2): qty 1

## 2017-07-08 MED ORDER — VENLAFAXINE HCL ER 37.5 MG PO CP24
37.5000 mg | ORAL_CAPSULE | Freq: Every day | ORAL | Status: DC
  Administered 2017-07-08: 37.5 mg via ORAL
  Filled 2017-07-08: qty 1

## 2017-07-08 MED ORDER — FLUTICASONE PROPIONATE 50 MCG/ACT NA SUSP
1.0000 | Freq: Two times a day (BID) | NASAL | Status: DC
  Filled 2017-07-08: qty 16

## 2017-07-08 MED ORDER — FLUTICASONE PROPIONATE HFA 220 MCG/ACT IN AERO: 1.0000 | INHALATION_SPRAY | Freq: Two times a day (BID) | RESPIRATORY_TRACT | Status: DC

## 2017-07-08 MED ORDER — POTASSIUM CHLORIDE 20 MEQ/15ML (10%) PO SOLN
20.0000 meq | Freq: Once | ORAL | Status: AC
  Administered 2017-07-08: 20 meq via ORAL
  Filled 2017-07-08: qty 15

## 2017-07-08 MED ORDER — ADULT MULTIVITAMIN W/MINERALS CH
1.0000 | ORAL_TABLET | Freq: Every day | ORAL | Status: DC
  Administered 2017-07-08: 1 via ORAL
  Filled 2017-07-08: qty 1

## 2017-07-08 MED ORDER — WARFARIN SODIUM 4 MG PO TABS
4.0000 mg | ORAL_TABLET | Freq: Once | ORAL | Status: DC
  Filled 2017-07-08: qty 1

## 2017-07-08 NOTE — Discharge Summary (Signed)
Physician Discharge Summary  Lennyx Verdell [facility] DPO:[id number] DOB: 1956/03/08 DOA: 07/07/2017  PCP: Antony Contras, MD  Admit date: 07/07/2017 Discharge date: 07/08/2017   Recommendations for Outpatient Follow-Up:   1. INR check next week 2. Concussion protocol--print off given with AVS 3. Cbc, BMP at next office visit 4. Suture check next week (placed by ER doc)   Discharge Diagnosis:   Principal Problem:   Fall Active Problems:   Paroxysmal atrial fibrillation (HCC)   Asthma   GERD   History of DVT (deep vein thrombosis)   Essential hypertension   Scalp laceration   Hypokalemia   Discharge disposition:  Home.:  Discharge Condition: Improved.  Diet recommendation: Low sodium, heart healthy  Wound care: None.   History of Present Illness:   [name] is a 62 y.o. female with medical history significant of atrial fibrillation on Coumadin, hypertension, hyperlipidemia, diabetes mellitus, asthma, GERD not on CPAP, DVT, diverticulitis, who presents with fall and head injury.  Pt states that she missed her steps and fell off ladder of 6-foot, landing on her bilateral feet, onto her back, and then hit her head on the concrete. She has scalp laceration and hematoma to the back of head. She states that she has had significant amount of bleeding. Patient had nausea and vomiting which has resolved now. No abdominal pain or diarrhea. Patient does not have LOC. She feel dizzy now. No unilateral weakness, numbness or tenderness extremities. Patient does not have chest pain, shortness breath, cough, fever or chills. No symptoms of UTI.     Hospital Course by Problem:   Fall with scalp laceration -repaired in ER -suspect concussion -symptoms improved on afternoon of discharge, and patient wanted to go home -CT scans stable-- no neuro changes  GERd protonix  Hypokalemia -repleted   Medical Consultants:    None.   Discharge Exam:   Vitals:   07/08/17 1527  07/08/17 1741  BP: (!) 142/77 128/65  Pulse: 90 84  Resp: (!) 21 20  Temp:    SpO2: 95% 95%   Vitals:   07/08/17 1149 07/08/17 1417 07/08/17 1527 07/08/17 1741  BP:  (!) 148/84 (!) 142/77 128/65  Pulse:  98 90 84  Resp:  (!) 21 (!) 21 20  Temp:      TempSrc:      SpO2: 96% 95% 95% 95%  Weight:      Height:        Gen:  NAD- up walking w/o issues   The results of significant diagnostics from this hospitalization (including imaging, microbiology, ancillary and laboratory) are listed below for reference.     Procedures and Diagnostic Studies:   Ct Head Wo Contrast  Result Date: 07/07/2017 CLINICAL DATA:  Fall from ladder, head injury, posterior midline scalp hematoma close to the vertex. EXAM: CT HEAD WITHOUT CONTRAST CT CERVICAL SPINE WITHOUT CONTRAST TECHNIQUE: Multidetector CT imaging of the head and cervical spine was performed following the standard protocol without intravenous contrast. Multiplanar CT image reconstructions of the cervical spine were also generated. COMPARISON:  None. FINDINGS: CT HEAD FINDINGS Brain: No acute intracranial hemorrhage, mass lesion, acute infarction, midline shift, herniation, hydrocephalus, or extra-axial fluid collection. Normal gray-white matter differentiation. Cisterns are patent. No cerebellar abnormality. Vascular: No hyperdense vessel or unexpected calcification. Skull: Normal. Negative for fracture or focal lesion. Sinuses/Orbits: No acute finding. Other: Posterior vertex high scalp injury with hematoma of the soft tissues. CT CERVICAL SPINE FINDINGS Alignment: Normal. Skull base and vertebrae: No acute  fracture. No primary bone lesion or focal pathologic process. Soft tissues and spinal canal: No prevertebral fluid or swelling. No visible canal hematoma. Disc levels: Degenerate spondylosis, most pronounced at C5-6 with disc space narrowing, sclerosis and osteophytes. Multilevel facet arthropathy posteriorly. No malalignment, subluxation, or  dislocation. Degenerative changes of the C1-2 articulation. Upper chest: Negative. Other: None. IMPRESSION: Posterior vertex high scalp injury with hematoma No acute intracranial abnormality by noncontrast CT No acute cervical spine fracture or malalignment. Cervical degenerative spondylosis. Electronically Signed   By: Jerilynn Mages.  Shick M.D.   On: 07/07/2017 20:39   Ct Cervical Spine Wo Contrast  Result Date: 07/07/2017 CLINICAL DATA:  Fall from ladder, head injury, posterior midline scalp hematoma close to the vertex. EXAM: CT HEAD WITHOUT CONTRAST CT CERVICAL SPINE WITHOUT CONTRAST TECHNIQUE: Multidetector CT imaging of the head and cervical spine was performed following the standard protocol without intravenous contrast. Multiplanar CT image reconstructions of the cervical spine were also generated. COMPARISON:  None. FINDINGS: CT HEAD FINDINGS Brain: No acute intracranial hemorrhage, mass lesion, acute infarction, midline shift, herniation, hydrocephalus, or extra-axial fluid collection. Normal gray-white matter differentiation. Cisterns are patent. No cerebellar abnormality. Vascular: No hyperdense vessel or unexpected calcification. Skull: Normal. Negative for fracture or focal lesion. Sinuses/Orbits: No acute finding. Other: Posterior vertex high scalp injury with hematoma of the soft tissues. CT CERVICAL SPINE FINDINGS Alignment: Normal. Skull base and vertebrae: No acute fracture. No primary bone lesion or focal pathologic process. Soft tissues and spinal canal: No prevertebral fluid or swelling. No visible canal hematoma. Disc levels: Degenerate spondylosis, most pronounced at C5-6 with disc space narrowing, sclerosis and osteophytes. Multilevel facet arthropathy posteriorly. No malalignment, subluxation, or dislocation. Degenerative changes of the C1-2 articulation. Upper chest: Negative. Other: None. IMPRESSION: Posterior vertex high scalp injury with hematoma No acute intracranial abnormality by  noncontrast CT No acute cervical spine fracture or malalignment. Cervical degenerative spondylosis. Electronically Signed   By: Jerilynn Mages.  Shick M.D.   On: 07/07/2017 20:39     Labs:   Basic Metabolic Panel: Recent Labs  Lab 07/07/17 2037 07/08/17 0425  NA 138 136  K 3.4* 4.6  CL 102 104  CO2 23 23  GLUCOSE 153* 134*  BUN 8 11  CREATININE 0.82 0.75  CALCIUM 9.4 9.1   GFR Estimated Creatinine Clearance: 86.5 mL/min (by C-G formula based on SCr of 0.75 mg/dL). Liver Function Tests: Recent Labs  Lab 07/07/17 2037  AST 33  ALT 32  ALKPHOS 75  BILITOT 0.5  PROT 6.5  ALBUMIN 3.7   No results for input(s): LIPASE, AMYLASE in the last 168 hours. No results for input(s): AMMONIA in the last 168 hours. Coagulation profile Recent Labs  Lab 07/07/17 2037 07/08/17 1417  INR 3.56 3.03    CBC: Recent Labs  Lab 07/07/17 2037 07/08/17 0425  WBC 14.3* 9.6  NEUTROABS 10.8*  --   HGB 12.8 11.7*  HCT 38.8 36.0  MCV 89.8 89.8  PLT 272 286   Cardiac Enzymes: No results for input(s): CKTOTAL, CKMB, CKMBINDEX, TROPONINI in the last 168 hours. BNP: Invalid input(s): POCBNP CBG: No results for input(s): GLUCAP in the last 168 hours. D-Dimer No results for input(s): DDIMER in the last 72 hours. Hgb A1c No results for input(s): HGBA1C in the last 72 hours. Lipid Profile No results for input(s): CHOL, HDL, LDLCALC, TRIG, CHOLHDL, LDLDIRECT in the last 72 hours. Thyroid function studies No results for input(s): TSH, T4TOTAL, T3FREE, THYROIDAB in the last 72 hours.  Invalid input(s):  FREET3 Anemia work up No results for input(s): VITAMINB12, FOLATE, FERRITIN, TIBC, IRON, RETICCTPCT in the last 72 hours. Microbiology No results found for this or any previous visit (from the past 240 hour(s)).   Discharge Instructions:   Discharge Instructions    Diet - low sodium heart healthy   Complete by:  As directed    Discharge instructions   Complete by:  As directed    Follow up  with PcP next week for wound check, INR, and CBC   Increase activity slowly   Complete by:  As directed      Allergies as of 07/08/2017      Reactions   Aspirin Other (See Comments)   Makes asthma worse and makes stomach hurt   Ivp Dye [iodinated Diagnostic Agents] Anaphylaxis   Required epinephrine. Since then, has tolerated with premed.   Latex Other (See Comments), Cough   Wheezing/cough   Meperidine Hcl Other (See Comments)   Projectile vomiting   Penicillins Anaphylaxis   Has patient had a PCN reaction causing immediate rash, facial/tongue/throat swelling, SOB or lightheadedness with hypotension:Yes Has patient had a PCN reaction causing severe rash involving mucus membranes or skin necrosis:Yes Has patient had a PCN reaction that required hospitalization:Treated in ER w/epi & breathing treatments Has patient had a PCN reaction occurring within the last 10 years:No If all of the above answers are "NO", then may proceed with Cephalosporin use.   Statins Other (See Comments)   Severe leg pain - has tried most of them   Sulfonamide Derivatives Anaphylaxis   Banana Swelling, Cough   Inside of the mouth swells   Food Other (See Comments), Cough   Nuts- mucus membranes inflamed/cough   Peanut-containing Drug Products Swelling, Cough   Inside of the mouth swells   Tape Hives, Dermatitis   No plastic tape!!      Medication List    STOP taking these medications   pantoprazole 40 MG tablet Commonly known as:  PROTONIX     TAKE these medications   acetaminophen 650 MG CR tablet Commonly known as:  TYLENOL Take 1,300 mg by mouth 2 (two) times daily.   cetirizine 10 MG tablet Commonly known as:  ZYRTEC Take 10 mg by mouth daily.   dicyclomine 10 MG capsule Commonly known as:  BENTYL Take 1 tablet by mouth 30 min before meals and at bedtime as needed for pain, cramping.   enoxaparin 100 MG/ML injection Commonly known as:  LOVENOX Inject 1 mL (100 mg total) into the skin  every 12 (twelve) hours.   FLOVENT HFA 220 MCG/ACT inhaler Generic drug:  fluticasone Inhale 1 puff into the lungs 2 (two) times daily.   fluticasone 50 MCG/ACT nasal spray Commonly known as:  FLONASE Place 1 spray into both nostrils 2 (two) times daily.   losartan 25 MG tablet Commonly known as:  COZAAR Take 1 tablet (25 mg total) by mouth daily.   MIRALAX packet Generic drug:  polyethylene glycol Take 17 g by mouth every evening.   multivitamin with minerals Tabs tablet Take 1 tablet by mouth daily.   omeprazole 40 MG capsule Commonly known as:  PRILOSEC Take 40 mg by mouth 2 (two) times daily.   PROAIR HFA 108 (90 Base) MCG/ACT inhaler Generic drug:  albuterol Inhale 2 puffs into the lungs every 6 (six) hours as needed for wheezing or shortness of breath.   sucralfate 1 GM/10ML suspension Commonly known as:  CARAFATE Take 10 mLs (1 g total) by  mouth every 6 (six) hours as needed. What changed:  reasons to take this   venlafaxine XR 37.5 MG 24 hr capsule Commonly known as:  EFFEXOR-XR Take 37.5 mg by mouth daily with breakfast.   verapamil 80 MG tablet Commonly known as:  CALAN Take 160 mg by mouth 2 (two) times daily.   Vitamin D3 1000 units Caps Take 2,000 Units by mouth daily.   warfarin 2 MG tablet Commonly known as:  COUMADIN Take as directed. If you are unsure how to take this medication, talk to your nurse or doctor. Original instructions:  TAKE AS DIRECTED BY COUMADIN CLINIC What changed:    how much to take  how to take this  when to take this  additional instructions      Follow-up Information    Antony Contras, MD Follow up in 1 week(s).   Specialty:  Family Medicine Why:  have  INR checked before starting lovenox Contact information: [place], [place] [address] [phone]        Thompson Grayer, MD .   Specialty:  Cardiology Contact information: [address] [place]  [address] [phone]            Time coordinating discharge: 35 min  Signed:  Geradine Girt   [facility] 07/08/2017, 5:44 PM

## 2017-07-08 NOTE — ED Notes (Signed)
Provider called for an update on pt, pt stating she is feeling better and feels she can go home; VSS, NSR. A&Ox4, family at bedside. Provider working on discharge papers at this time.

## 2017-07-08 NOTE — ED Notes (Signed)
While ambulating pt pts HR went from 80s to 120bpm.  Pt stated she feels better then she did.

## 2017-07-08 NOTE — ED Provider Notes (Signed)
Pt presented after a fall.  Scalp laceration, hematoma noted.  Pt taking coumadin. Physical Exam  BP (!) 156/86   Pulse 94   Temp 97.9 F (36.6 C) (Oral)   Resp (!) 23   Ht 1.651 m (5\' 5" )   Wt 104.8 kg (231 lb)   SpO2 96%   BMI 38.44 kg/m   Physical Exam Hematoma noted posterior occiput, persitent bleeding noted on my exam after initial repair attempt.  Matted blood and macerated tissue made the examination difficult.  Wound cleansed and explored thoroughly.  2 cm tissue laceration visualized and palpated.  ED Course/Procedures    .Marland KitchenLaceration Repair Date/Time: 07/08/2017 11:51 AM Performed by: Dorie Rank, MD Authorized by: Dorie Rank, MD   Consent:    Consent obtained:  Verbal   Consent given by:  Patient   Risks discussed:  Infection, need for additional repair, pain, poor cosmetic result and poor wound healing   Alternatives discussed:  No treatment and delayed treatment Universal protocol:    Procedure explained and questions answered to patient or proxy's satisfaction: yes     Relevant documents present and verified: yes     Test results available and properly labeled: yes     Imaging studies available: yes     Required blood products, implants, devices, and special equipment available: yes     Site/side marked: yes     Immediately prior to procedure, a time out was called: yes     Patient identity confirmed:  Verbally with patient Anesthesia (see MAR for exact dosages):    Anesthesia method:  Local infiltration   Local anesthetic:  Procaine 1% WITH epi Laceration details:    Location:  Scalp   Length (cm):  2   Depth (mm):  3 Repair type:    Repair type:  Complex Pre-procedure details:    Preparation:  Patient was prepped and draped in usual sterile fashion Exploration:    Hemostasis achieved with:  Direct pressure   Contaminated: no   Treatment:    Area cleansed with:  Saline   Amount of cleaning:  Standard Skin repair:    Repair method:  Sutures  Suture size:  3-0   Suture material:  Prolene   Suture technique:  Simple interrupted and running   Number of sutures:  8 Approximation:    Approximation:  Close Comments:     Several sutures placed to control bleeding.  Ultimately hemostasis achieved    MDM  Additional laceration noted on my exam after initial repair.  Additional suturing performed with eventual hemostasis.     Dorie Rank, MD 07/08/17 1154

## 2017-07-08 NOTE — ED Notes (Signed)
Pt ambulated to bedside commode; HR increased to 120. Pt back in bed, states she is concerned to ambulate hallway at this time with HR increasing while on bedside commode.

## 2017-07-08 NOTE — ED Notes (Signed)
Patient assisted to [facility] c/o being very dizzy when she sits up . Stayed with patient while she was on [facility]. Stretcher sheets changed and warm blanket given . C/o headache Medication given. . Dressing is dry and intact to head.

## 2017-07-08 NOTE — ED Notes (Signed)
Lunch tray provided. 

## 2017-07-08 NOTE — Discharge Instructions (Signed)
Concussion, Adult  A concussion is a brain injury from a direct hit (blow) to the head or body. This injury causes the brain to shake quickly back and forth inside the skull. It is caused by:   A hit to the head.   A quick and sudden movement (jolt) of the head or neck.    How fast you will get better from a concussion depends on many things like how bad your concussion was, what part of your brain was hurt, how old you are, and how healthy you were before the concussion. Recovery can take time. It is important to wait to return to activity until a doctor says it is safe and your symptoms are all gone.  Follow these instructions at home:  Activity   Limit activities that need a lot of thought or concentration. These include:  ? Homework or work for your job.  ? Watching TV.  ? Computer work.  ? Playing memory games and puzzles.   Rest. Rest helps the brain to heal. Make sure you:  ? Get plenty of sleep at night. Do not stay up late.  ? Go to bed at the same time every day.  ? Rest during the day. Take naps or rest breaks when you feel tired.   It can be dangerous if you get another concussion before the first one has healed Do not do activities that could cause a second concussion, such as riding a bike or playing sports.   Ask your doctor when you can return to your normal activities, like driving, riding a bike, or using machinery. Your ability to react may be slower. Do not do these activities if you are dizzy. Your doctor will likely give you a plan for slowly going back to activities.  General instructions   Take over-the-counter and prescription medicines only as told by your doctor.   Do not drink alcohol until your doctor says you can.   If it is harder than usual to remember things, write them down.   If you are easily distracted, try to do one thing at a time. For example, do not try to watch TV while making dinner.   Talk with family members or close friends when you need to make important  decisions.   Watch your symptoms and tell other people to do the same. Other problems (complications) can happen after a concussion. Older adults with a brain injury may have a higher risk of serious problems, such as a blood clot in the brain.   Tell your teachers, school nurse, school counselor, coach, athletic trainer, or work manager about your injury and symptoms. Tell them about what you can or cannot do. They should watch for:  ? More problems with attention or concentration.  ? More trouble remembering or learning new information.  ? More time needed to do tasks or assignments.  ? Being more annoyed (irritable) or having a harder time dealing with stress.  ? Any other symptoms that get worse.   Keep all follow-up visits as told by your health care provider. This is important.  Prevention   It is very important that you donot get another brain injury, especially before you have healed. In rare cases, another injury can cause permanent brain damage, brain swelling, or death. You have the most risk if you get another head injury in the first 7-10 days after you were hurt before. To avoid injuries:  ? Wear a seat belt when you ride in   a car.  ? Do not drink too much alcohol.  ? Avoid activities that could make you get a second concussion, like contact sports.  ? Wear a helmet when you do activities like:   Biking.   Skiing.   Skateboarding.   Skating.  ? Make your home safe by:   Removing things from the floor or stairs that could make you trip.   Using grab bars in bathrooms and handrails by stairs.   Placing non-slip mats on floors and in bathtubs.   Putting more light in dark areas.  Contact a doctor if:   Your symptoms get worse.   You have new symptoms.   You keep having symptoms for more than 2 weeks.  Get help right away if:   You have bad headaches, or your headaches get worse.   You have weakness in any part of your body.   You have loss of feeling (numbness).   You feel off  balance.   You keep throwing up (vomiting).   You feel more sleepy.   The black center of one eye (pupil) is bigger than the other one.   You twitch or shake violently (convulse) or have a seizure.   Your speech is not clear (is slurred).   You feel more tired, more confused, or more annoyed.   You do not recognize people or places.   You have neck pain.   It is hard to wake you up.   You have strange behavior changes.   You pass out (lose consciousness).  Summary   A concussion is a brain injury from a direct hit (blow) to the head or body.   This condition is treated with rest and careful watching of symptoms.   If you keep having symptoms for more than 2 weeks, call your doctor.  This information is not intended to replace advice given to you by your health care provider. Make sure you discuss any questions you have with your health care provider.  Document Released: 02/25/2009 Document Revised: 02/22/2016 Document Reviewed: 02/22/2016  Elsevier Interactive Patient Education  2017 [employer]

## 2017-07-08 NOTE — ED Notes (Signed)
RT at bedside to [facility].

## 2017-07-08 NOTE — Progress Notes (Signed)
Patient admitted after midnight, please see H&P.  Will recheck INR this AM.  Symptoms currently are consistent with concussion.  Plan to observe with neuro checks and re-image if needed.  Ambulate patient (c/o dizziness).  Will re-assess this PM and possible d/c thereafter.  Eulogio Bear DO

## 2017-07-08 NOTE — ED Notes (Signed)
HH/CARB MOD TRAY ORDERED

## 2017-07-08 NOTE — H&P (Signed)
History and Physical    [name] BHA:[id number] DOB: Mar 21, 1956 DOA: 07/07/2017  Referring MD/NP/PA:   PCP: Antony Contras, MD   Patient coming from:  The patient is coming from home.  At baseline, pt is independent for most of ADL.   Chief Complaint: fall and head injur  HPI: [name] is a 62 y.o. female with medical history significant of atrial fibrillation on Coumadin, hypertension, hyperlipidemia, diabetes mellitus, asthma, GERD not on CPAP, DVT, diverticulitis, who presents with fall and head injury.  Pt states that she missed her steps and fell off ladder of 6-foot, landing on her bilateral feet, onto her back, and then hit her head on the concrete. She has scalp laceration and hematoma to the back of head. She states that she has had significant amount of bleeding. Patient had nausea and vomiting which has resolved now. No abdominal pain or diarrhea. Patient does not have LOC. She feel dizzy now. No unilateral weakness, numbness or tenderness extremities. Patient does not have chest pain, shortness breath, cough, fever or chills. No symptoms of UTI.  ED Course: pt was found to have hemoglobin 12.8, WBC 14.3, negative troponin, INR 3.56,  Potassium of 3.4, U normal, no tachycardia, no tachypnea, temperature normal, oxygen saturation 96% on room air. CT head is negative for acute intracranial abnormalities. CT of C-spine is negative for acute issues, but showed degenerative spondylosis. Patient is placed on telemetry bed for observation.  Review of Systems:   General: no fevers, chills, no body weight gain, has fatigue HEENT: no blurry vision, hearing changes or sore throat Respiratory: no dyspnea, coughing, wheezing CV: no chest pain, no palpitations GI: no nausea, vomiting, abdominal pain, diarrhea, constipation GU: no dysuria, burning on urination, increased urinary frequency, hematuria  Ext: no leg edema Neuro: no unilateral weakness, numbness, or tingling, no  vision change or hearing loss. Had fall. Skin: has scalp laceration and hematoma MSK: No muscle spasm, no deformity, no limitation of range of movement in spin Heme: No easy bruising.  Travel history: No recent long distant travel.  Allergy:  Allergies  Allergen Reactions  . Aspirin Other (See Comments)    Makes asthma worse and makes stomach hurt  . Ivp Dye [Iodinated Diagnostic Agents] Anaphylaxis    Required epinephrine. Since then, has tolerated with premed.  . Latex Other (See Comments) and Cough    Wheezing/cough   . Meperidine Hcl Other (See Comments)    Projectile vomiting   . Penicillins Anaphylaxis    Has patient had a PCN reaction causing immediate rash, facial/tongue/throat swelling, SOB or lightheadedness with hypotension:Yes Has patient had a PCN reaction causing severe rash involving mucus membranes or skin necrosis:Yes Has patient had a PCN reaction that required hospitalization:Treated in ER w/epi & breathing treatments Has patient had a PCN reaction occurring within the last 10 years:No If all of the above answers are "NO", then may proceed with Cephalosporin use.   . Statins Other (See Comments)    Severe leg pain - has tried most of them  . Sulfonamide Derivatives Anaphylaxis  . Banana Swelling and Cough    Inside of the mouth swells  . Food Other (See Comments) and Cough    Nuts- mucus membranes inflamed/cough   . Peanut-Containing Drug Products Swelling and Cough    Inside of the mouth swells  . Tape Hives and Dermatitis    No plastic tape!!    Past Medical History:  Diagnosis Date  . Allergic rhinitis   .  Arthritis    "back" (05/07/2016)  . Asthma   . Atrial flutter ([place])    a. Remotely per notes.  . Carpal tunnel syndrome, bilateral   . Colon polyps   . Diverticulitis   . DM (diabetes mellitus) ([place])   . DVT (deep venous thrombosis) ([place]) 1980s x2 -    between birth of two sons. Saw hematology - was told she has a hypercoagulable disorder,  should be on Coumadin lifelong.  . Family history of adverse reaction to anesthesia    "mother gets PONV"  . Gallbladder disease   . GERD (gastroesophageal reflux disease)   . Halothane adverse reaction    narrow small opening  . Heart murmur   . Hiatal hernia   . History of hiatal hernia    "small one/CTA 05/01/2016" (05/07/2016)  . HTN (hypertension)   . Hypercholesteremia    hx; "brought it down w/diet" (05/07/2016)  . Hyperglycemia    a. A1c 6.1 in 2014.  . Inguinal hernia   . Left sided sciatica   . Normal cardiac stress test 06/2012  . Obesity   . OSA (obstructive sleep apnea)    a. Mild, did not tolerate CPAP (05/07/2016)  . PAF (paroxysmal atrial fibrillation) ([place])    a. s/p afib ablation at [facility] 2010 (Dr. Annabell Howells). b. Recurrent AF s/p DCCV 06/2010. c. On flecainide.   Marland Kitchen PONV (postoperative nausea and vomiting)   . PPD positive, treated 1977   "treated for 1 yr after exposure to patient"  . RSV infection ~ 2006  . Wandering (atrial) pacemaker    a. Remotely per notes.    Past Surgical History:  Procedure Laterality Date  . ATRIAL ABLATION SURGERY  05/07/2016   "fib and flutter"  . ATRIAL FIBRILLATION ABLATION  2010   at [facility]  . ATRIAL FIBRILLATION ABLATION N/A 05/07/2016   Procedure: Atrial Fibrillation Ablation;  Surgeon: Thompson Grayer, MD;  Location: [facility];  Service: Cardiovascular;  Laterality: N/A;  . BREAST CYST ASPIRATION Bilateral   . CARDIAC CATHETERIZATION  2008  . CARPAL TUNNEL RELEASE Bilateral   . CESAREAN SECTION  1986; 1988  . COLONOSCOPY  X 2  . COLONOSCOPY W/ BIOPSIES AND POLYPECTOMY  X 2  . ESOPHAGOGASTRODUODENOSCOPY    . INGUINAL HERNIA REPAIR Right   . LAPAROSCOPIC CHOLECYSTECTOMY  1989  . TMJ ARTHROPLASTY    . TUBAL LIGATION  1988    Social History:  reports that she has never smoked. She has never used smokeless tobacco. She reports that she drinks alcohol. She reports that she does not use drugs.  Family  History:  Family History  Problem Relation Age of Onset  . Stroke Mother   . Heart attack Mother        2 previous MIs, 3 stents - CAD first diagnosed 54  . Thyroid disease Mother   . Colon polyps Father   . Lupus Sister   . Heart attack Paternal Grandfather        Died at 64 of massive MI  . Stroke Maternal Grandfather        Multiple family members  . Stroke Paternal Grandmother   . Thyroid disease Sister        x 2  . Colon cancer Neg Hx   . Stomach cancer Neg Hx      Prior to Admission medications   Medication Sig Start Date End Date Taking? Authorizing Provider  acetaminophen (TYLENOL) 650 MG CR tablet Take 1,300  mg by mouth 2 (two) times daily.   Yes [provider]  cetirizine (ZYRTEC) 10 MG tablet Take 10 mg by mouth daily.     Yes [provider]  Cholecalciferol (VITAMIN D3) 1000 UNITS CAPS Take 2,000 Units by mouth daily.    Yes [provider]  dicyclomine (BENTYL) 10 MG capsule Take 1 tablet by mouth 30 min before meals and at bedtime as needed for pain, cramping. 11/24/16  Yes Esterwood, Amy S, PA-C  FLOVENT HFA 220 MCG/ACT inhaler Inhale 1 puff into the lungs 2 (two) times daily. 04/20/16  Yes [provider]  fluticasone (FLONASE) 50 MCG/ACT nasal spray Place 1 spray into both nostrils 2 (two) times daily.  06/02/12  Yes [provider]  losartan (COZAAR) 25 MG tablet Take 1 tablet (25 mg total) by mouth daily. 12/20/13  Yes Allred, Jeneen Rinks, MD  Multiple Vitamin (MULTIVITAMIN WITH MINERALS) TABS tablet Take 1 tablet by mouth daily.   Yes [provider]  omeprazole (PRILOSEC) 40 MG capsule Take 40 mg by mouth 2 (two) times daily.   Yes [provider]  polyethylene glycol (MIRALAX) packet Take 17 g by mouth every evening.    Yes [provider]  PROAIR HFA 108 (90 BASE) MCG/ACT inhaler Inhale 2 puffs into the lungs every 6 (six) hours as needed for wheezing or shortness of breath.  03/22/12  Yes [provider]  venlafaxine XR (EFFEXOR-XR) 37.5 MG 24 hr capsule Take 37.5 mg by mouth daily with breakfast.  11/21/12  Yes [provider]  verapamil (CALAN) 80 MG tablet Take 160 mg by mouth 2 (two) times daily.   Yes [provider]  warfarin (COUMADIN) 2 MG tablet TAKE AS DIRECTED BY COUMADIN CLINIC Patient taking differently: Take 4-6 mg by mouth See admin instructions. Take 6 mg by mouth in the morning on Sun/Tues/Thurs/Sat and 4 mg on Mon/Wed/Fri 04/14/17  Yes Allred, Jeneen Rinks, MD  enoxaparin (LOVENOX) 100 MG/ML injection Inject 1 mL (100 mg total) into the skin every 12 (twelve) hours. 06/07/17   Allred, Jeneen Rinks, MD  pantoprazole (PROTONIX) 40 MG tablet Take 1 tablet (40 mg total) by mouth 2 (two) times daily. Patient not taking: Reported on 07/07/2017 06/17/17   Yetta Flock, MD  sucralfate (CARAFATE) 1 GM/10ML suspension Take 10 mLs (1 g total) by mouth every 6 (six) hours as needed. Patient taking differently: Take 1 g by mouth every 6 (six) hours as needed (for GERD symptoms).  06/15/17   Yetta Flock, MD    Physical Exam: Vitals:   07/07/17 1928 07/07/17 1933 07/07/17 2015 07/07/17 2038  BP:   (!) 130/58   Pulse:   74   Resp:   18   Temp:    97.9 F (36.6 C)  TempSrc:    Oral  SpO2: 94%  96%   Weight:  104.8 kg (231 lb)    Height:  5\' 5"  (1.651 m)     General: Not in acute distress HEENT: pt's head is wrapped up by EDP. I did not open it. Per EDP, pt has scalp laceration and hematoma to the back of head. Laceration was sutured up. Bleeding is controlled now.       Eyes: PERRL, EOMI, no scleral icterus.       ENT: No discharge from the ears and nose, no pharynx injection, no tonsillar enlargement.        Neck: No JVD, no bruit, no mass felt. Heme: No neck lymph  node enlargement. Cardiac: S1/S2, RRR, No murmurs, No gallops or rubs. Respiratory: No rales, wheezing, rhonchi or rubs. GI: Soft, nondistended, nontender, no rebound pain, no organomegaly,  BS present. GU: No hematuria Ext: No pitting leg edema bilaterally. 2+DP/PT pulse bilaterally. Musculoskeletal: No joint deformities, No joint redness or warmth, no limitation of ROM in spin. Skin: No rashes.  Neuro: Alert, oriented X3, cranial nerves II-XII grossly intact, moves all extremities normally.  Psych: Patient is not psychotic, no suicidal or hemocidal ideation.  Labs on Admission: I have personally reviewed following labs and imaging studies  CBC: Recent Labs  Lab 07/07/17 2037  WBC 14.3*  NEUTROABS 10.8*  HGB 12.8  HCT 38.8  MCV 89.8  PLT 010   Basic Metabolic Panel: Recent Labs  Lab 07/07/17 2037  NA 138  K 3.4*  CL 102  CO2 23  GLUCOSE 153*  BUN 8  CREATININE 0.82  CALCIUM 9.4   GFR: Estimated Creatinine Clearance: 84.4 mL/min (by C-G formula based on SCr of 0.82 mg/dL). Liver Function Tests: Recent Labs  Lab 07/07/17 2037  AST 33  ALT 32  ALKPHOS 75  BILITOT 0.5  PROT 6.5  ALBUMIN 3.7   No results for input(s): LIPASE, AMYLASE in the last 168 hours. No results for input(s): AMMONIA in the last 168 hours. Coagulation Profile: Recent Labs  Lab 07/07/17 2037  INR 3.56   Cardiac Enzymes: No results for input(s): CKTOTAL, CKMB, CKMBINDEX, TROPONINI in the last 168 hours. BNP (last 3 results) No results for input(s): PROBNP in the last 8760 hours. HbA1C: No results for input(s): HGBA1C in the last 72 hours. CBG: No results for input(s): GLUCAP in the last 168 hours. Lipid Profile: No results for input(s): CHOL, HDL, LDLCALC, TRIG, CHOLHDL, LDLDIRECT in the last 72 hours. Thyroid Function Tests: No results for input(s): TSH, T4TOTAL, FREET4, T3FREE, THYROIDAB in the last 72 hours. Anemia Panel: No results for input(s): VITAMINB12, FOLATE, FERRITIN, TIBC, IRON, RETICCTPCT in the last 72 hours. Urine analysis:    Component Value Date/Time   COLORURINE YELLOW 10/07/2016 [place] 10/07/2016 1303   LABSPEC 1.020 10/07/2016  1303   PHURINE 6.0 10/07/2016 1303   GLUCOSEU NEGATIVE 10/07/2016 1303   HGBUR NEGATIVE 10/07/2016 1303   BILIRUBINUR NEGATIVE 10/07/2016 1303   KETONESUR NEGATIVE 10/07/2016 1303   PROTEINUR NEGATIVE 10/07/2016 1303   UROBILINOGEN 1.0 11/29/2014 1300   NITRITE NEGATIVE 10/07/2016 1303   LEUKOCYTESUR TRACE (A) 10/07/2016 1303   Sepsis Labs: @LABRCNTIP (procalcitonin:4,lacticidven:4) )No results found for this or any previous visit (from the past 240 hour(s)).   Radiological Exams on Admission: Ct Head Wo Contrast  Result Date: 07/07/2017 CLINICAL DATA:  Fall from ladder, head injury, posterior midline scalp hematoma close to the vertex. EXAM: CT HEAD WITHOUT CONTRAST CT CERVICAL SPINE WITHOUT CONTRAST TECHNIQUE: Multidetector CT imaging of the head and cervical spine was performed following the standard protocol without intravenous contrast. Multiplanar CT image reconstructions of the cervical spine were also generated. COMPARISON:  None. FINDINGS: CT HEAD FINDINGS Brain: No acute intracranial hemorrhage, mass lesion, acute infarction, midline shift, herniation, hydrocephalus, or extra-axial fluid collection. Normal gray-white matter differentiation. Cisterns are patent. No cerebellar abnormality. Vascular: No hyperdense vessel or unexpected calcification. Skull: Normal. Negative for fracture or focal lesion. Sinuses/Orbits: No acute finding. Other: Posterior vertex high scalp injury with hematoma of the soft tissues. CT CERVICAL SPINE FINDINGS Alignment: Normal. Skull base and vertebrae: No acute fracture. No primary bone lesion or focal pathologic process. Soft tissues  and spinal canal: No prevertebral fluid or swelling. No visible canal hematoma. Disc levels: Degenerate spondylosis, most pronounced at C5-6 with disc space narrowing, sclerosis and osteophytes. Multilevel facet arthropathy posteriorly. No malalignment, subluxation, or dislocation. Degenerative changes of the C1-2 articulation.  Upper chest: Negative. Other: None. IMPRESSION: Posterior vertex high scalp injury with hematoma No acute intracranial abnormality by noncontrast CT No acute cervical spine fracture or malalignment. Cervical degenerative spondylosis. Electronically Signed   By: Jerilynn Mages.  Shick M.D.   On: 07/07/2017 20:39   Ct Cervical Spine Wo Contrast  Result Date: 07/07/2017 CLINICAL DATA:  Fall from ladder, head injury, posterior midline scalp hematoma close to the vertex. EXAM: CT HEAD WITHOUT CONTRAST CT CERVICAL SPINE WITHOUT CONTRAST TECHNIQUE: Multidetector CT imaging of the head and cervical spine was performed following the standard protocol without intravenous contrast. Multiplanar CT image reconstructions of the cervical spine were also generated. COMPARISON:  None. FINDINGS: CT HEAD FINDINGS Brain: No acute intracranial hemorrhage, mass lesion, acute infarction, midline shift, herniation, hydrocephalus, or extra-axial fluid collection. Normal gray-white matter differentiation. Cisterns are patent. No cerebellar abnormality. Vascular: No hyperdense vessel or unexpected calcification. Skull: Normal. Negative for fracture or focal lesion. Sinuses/Orbits: No acute finding. Other: Posterior vertex high scalp injury with hematoma of the soft tissues. CT CERVICAL SPINE FINDINGS Alignment: Normal. Skull base and vertebrae: No acute fracture. No primary bone lesion or focal pathologic process. Soft tissues and spinal canal: No prevertebral fluid or swelling. No visible canal hematoma. Disc levels: Degenerate spondylosis, most pronounced at C5-6 with disc space narrowing, sclerosis and osteophytes. Multilevel facet arthropathy posteriorly. No malalignment, subluxation, or dislocation. Degenerative changes of the C1-2 articulation. Upper chest: Negative. Other: None. IMPRESSION: Posterior vertex high scalp injury with hematoma No acute intracranial abnormality by noncontrast CT No acute cervical spine fracture or malalignment.  Cervical degenerative spondylosis. Electronically Signed   By: Jerilynn Mages.  Shick M.D.   On: 07/07/2017 20:39     EKG:  Not done in ED.  Assessment/Plan Principal Problem:   Fall Active Problems:   Paroxysmal atrial fibrillation (HCC)   Asthma   GERD   History of DVT (deep vein thrombosis)   Essential hypertension   Scalp laceration   Hypokalemia   Fall and Scalp laceration: pt seems to have had a Dealer fall.  Scalp laceration was sutured up. Bleeding is controlled now. Patient feels dizzy, but No focal neurologic findings on physical examination. Hemodynamically stable. Hemoglobin 12.8 on admission.  -will place on tele bed for obs -when necessary Tylenol for pain -Frequent neuro checks -repeat CBC for Hgb level in AM  Paroxysmal atrial fibrillation ([place]): CHA2DS2-VASc Score is 3, needs oral anticoagulation. Patient is on Coumadin at home. INR is 3.56 on admission. Heart rate is well controlled. -will hold coumadin tonight. Will not reverse coumadin since her bleeding is controled. -continue verapamil   GERD: -Protonix  Asthma: stable. No wheezing -continue home inhalers -prn albuterol nebs  History of DVT (deep vein thrombosis): on coumadin. INR 3.56 -hold coumadin,   Essential hypertension: -continue verapamil, Cozaar -IV Hydralazine when necessary  Hypokalemia: K= 3.4 on admission. - Repleted   DVT ppx: none (INR=3.56) Code Status: Full code Family Communication:   Yes, patient's  husband at bed side Disposition Plan:  Anticipate discharge back to previous home environment Consults called:  None Admission status: Obs / tele      Date of Service 07/08/2017    Ivor Costa [facility] Pager [phone]  If 7PM-7AM, please contact night-coverage [email or web address] Password [email or web address]  07/08/2017, 1:25 AM

## 2017-07-08 NOTE — Progress Notes (Signed)
ANTICOAGULATION CONSULT NOTE - Initial Consult  Pharmacy Consult for warfarin Indication: atrial fibrillation  Allergies  Allergen Reactions  . Aspirin Other (See Comments)    Makes asthma worse and makes stomach hurt  . Ivp Dye [Iodinated Diagnostic Agents] Anaphylaxis    Required epinephrine. Since then, has tolerated with premed.  . Latex Other (See Comments) and Cough    Wheezing/cough   . Meperidine Hcl Other (See Comments)    Projectile vomiting   . Penicillins Anaphylaxis    Has patient had a PCN reaction causing immediate rash, facial/tongue/throat swelling, SOB or lightheadedness with hypotension:Yes Has patient had a PCN reaction causing severe rash involving mucus membranes or skin necrosis:Yes Has patient had a PCN reaction that required hospitalization:Treated in ER w/epi & breathing treatments Has patient had a PCN reaction occurring within the last 10 years:No If all of the above answers are "NO", then may proceed with Cephalosporin use.   . Statins Other (See Comments)    Severe leg pain - has tried most of them  . Sulfonamide Derivatives Anaphylaxis  . Banana Swelling and Cough    Inside of the mouth swells  . Food Other (See Comments) and Cough    Nuts- mucus membranes inflamed/cough   . Peanut-Containing Drug Products Swelling and Cough    Inside of the mouth swells  . Tape Hives and Dermatitis    No plastic tape!!    Patient Measurements: Height: 5\' 5"  (165.1 cm) Weight: 231 lb (104.8 kg) IBW/kg (Calculated) : 57  Vital Signs: BP: 148/84 (04/18 1417) Pulse Rate: 98 (04/18 1417)  Labs: Recent Labs    07/07/17 2037 07/08/17 0425 07/08/17 1417  HGB 12.8 11.7*  --   HCT 38.8 36.0  --   PLT 272 286  --   LABPROT 35.3*  --  31.1*  INR 3.56  --  3.03  CREATININE 0.82 0.75  --     Estimated Creatinine Clearance: 86.5 mL/min (by C-G formula based on SCr of 0.75 mg/dL).   Medical History: Past Medical History:  Diagnosis Date  . Allergic  rhinitis   . Arthritis    "back" (05/07/2016)  . Asthma   . Atrial flutter ([place])    a. Remotely per notes.  . Carpal tunnel syndrome, bilateral   . Colon polyps   . Diverticulitis   . DM (diabetes mellitus) ([place])   . DVT (deep venous thrombosis) ([place]) 1980s x2 -    between birth of two sons. Saw hematology - was told she has a hypercoagulable disorder, should be on Coumadin lifelong.  . Family history of adverse reaction to anesthesia    "mother gets PONV"  . Gallbladder disease   . GERD (gastroesophageal reflux disease)   . Halothane adverse reaction    narrow small opening  . Heart murmur   . Hiatal hernia   . History of hiatal hernia    "small one/CTA 05/01/2016" (05/07/2016)  . HTN (hypertension)   . Hypercholesteremia    hx; "brought it down w/diet" (05/07/2016)  . Hyperglycemia    a. A1c 6.1 in 2014.  . Inguinal hernia   . Left sided sciatica   . Normal cardiac stress test 06/2012  . Obesity   . OSA (obstructive sleep apnea)    a. Mild, did not tolerate CPAP (05/07/2016)  . PAF (paroxysmal atrial fibrillation) ([place])    a. s/p afib ablation at [facility] 2010 (Dr. Annabell Howells). b. Recurrent AF s/p DCCV 06/2010. c. On flecainide.   Marland Kitchen PONV (  postoperative nausea and vomiting)   . PPD positive, treated 1977   "treated for 1 yr after exposure to patient"  . RSV infection ~ 2006  . Wandering (atrial) pacemaker    a. Remotely per notes.    Medications:  Scheduled:  . budesonide (PULMICORT) nebulizer solution  0.25 mg Nebulization BID  . cholecalciferol  2,000 Units Oral Daily  . dicyclomine  10 mg Oral TID AC  . fluticasone  1 spray Each Nare BID  . loratadine  10 mg Oral Daily  . losartan  25 mg Oral Daily  . multivitamin with minerals  1 tablet Oral Daily  . pantoprazole  40 mg Oral BID  . polyethylene glycol  17 g Oral QPM  . venlafaxine XR  37.5 mg Oral Q breakfast  . verapamil  160 mg Oral BID    Assessment: 62 yo female admitted 4/18 early AM after fall where she  hit her head on concrete and had bleeding laceration. CT of head and spine show no intracranial bleed but a hematoma to the back of the head.  Patient on warfarin outpatient for afib. Home dose: 6 mg on Su/Tu/Th/Sa and 4 mg on M/W/F. Last dose taken 4/17. INR 3.56 when admitted and now 3.03. Will dose tonight at a lower dose based on higher INR.  Goal of Therapy:  INR 2-3 Monitor platelets by anticoagulation protocol: Yes   Plan:  Warfarin 4 mg tonight x 1 Daily INR  Blaine Hamper [name] 07/08/2017,3:26 PM [phone]

## 2017-07-13 ENCOUNTER — Encounter (HOSPITAL_COMMUNITY): Payer: Self-pay

## 2017-07-13 ENCOUNTER — Other Ambulatory Visit: Payer: Self-pay

## 2017-07-13 ENCOUNTER — Ambulatory Visit (INDEPENDENT_AMBULATORY_CARE_PROVIDER_SITE_OTHER): Payer: 59 | Admitting: Pharmacist

## 2017-07-13 DIAGNOSIS — Z5181 Encounter for therapeutic drug level monitoring: Secondary | ICD-10-CM | POA: Diagnosis not present

## 2017-07-13 DIAGNOSIS — I480 Paroxysmal atrial fibrillation: Secondary | ICD-10-CM

## 2017-07-13 DIAGNOSIS — I4891 Unspecified atrial fibrillation: Secondary | ICD-10-CM

## 2017-07-13 LAB — POCT INR: INR: 2.1

## 2017-07-13 NOTE — Patient Instructions (Addendum)
Description   Follow instructions below for lovenox dosing around procedure. Continue same dose of coumadin 6mg  daily except 4mg  on Mondays, Wednesdays, and Fridays. Recheck INR 2 weeks.  Call Coumadin Clinic [phone] Main # [phone] with any questions     07/13/17: Last dose of Coumadin.  07/14/17: No Coumadin or Lovenox.  07/15/17: Inject Lovenox 100mg  in the fatty abdominal tissue at least 2 inches from the belly button twice a day about 12 hours apart, 8am and 8pm rotate sites. No Coumadin.  07/16/17: Inject Lovenox in the fatty tissue every 12 hours, 8am and 8pm. No Coumadin.  07/17/17: Inject Lovenox in the fatty tissue every 12 hours, 8am and 8pm. No Coumadin.  07/18/17: Inject Lovenox in the fatty tissue in the morning at 8 am (No PM dose). No Coumadin.  07/19/17: Procedure Day - No Lovenox - Resume Coumadin in the evening or as directed by doctor.  07/20/17: Resume Lovenox inject in the fatty tissue every 12 hours and take Coumadin.  07/21/17: Inject Lovenox in the fatty tissue every 12 hours and take Coumadin.  07/22/17: Inject Lovenox in the fatty tissue every 12 hours and take Coumadin.  07/23/17: Coumadin appt to check INR.

## 2017-07-15 ENCOUNTER — Other Ambulatory Visit (HOSPITAL_COMMUNITY): Payer: Self-pay | Admitting: Family Medicine

## 2017-07-15 DIAGNOSIS — R1030 Lower abdominal pain, unspecified: Secondary | ICD-10-CM

## 2017-07-15 DIAGNOSIS — Z4802 Encounter for removal of sutures: Secondary | ICD-10-CM | POA: Diagnosis not present

## 2017-07-19 ENCOUNTER — Other Ambulatory Visit: Payer: Self-pay

## 2017-07-19 ENCOUNTER — Encounter (HOSPITAL_COMMUNITY): Payer: Self-pay | Admitting: *Deleted

## 2017-07-19 ENCOUNTER — Encounter (HOSPITAL_COMMUNITY)
Admission: RE | Disposition: A | Discharge status: Home / Self Care | Payer: Self-pay | Source: Ambulatory Visit | Attending: Gastroenterology

## 2017-07-19 ENCOUNTER — Ambulatory Visit (HOSPITAL_COMMUNITY): Payer: 59 | Admitting: Anesthesiology

## 2017-07-19 ENCOUNTER — Ambulatory Visit (HOSPITAL_COMMUNITY)
Admission: RE | Admit: 2017-07-19 | Discharge: 2017-07-19 | Disposition: A | Discharge status: Home / Self Care | Payer: 59 | Source: Ambulatory Visit | Attending: Gastroenterology | Admitting: Gastroenterology

## 2017-07-19 DIAGNOSIS — Z86718 Personal history of other venous thrombosis and embolism: Secondary | ICD-10-CM | POA: Diagnosis not present

## 2017-07-19 DIAGNOSIS — I10 Essential (primary) hypertension: Secondary | ICD-10-CM | POA: Insufficient documentation

## 2017-07-19 DIAGNOSIS — K317 Polyp of stomach and duodenum: Secondary | ICD-10-CM

## 2017-07-19 DIAGNOSIS — K219 Gastro-esophageal reflux disease without esophagitis: Secondary | ICD-10-CM | POA: Insufficient documentation

## 2017-07-19 DIAGNOSIS — G4733 Obstructive sleep apnea (adult) (pediatric): Secondary | ICD-10-CM | POA: Diagnosis not present

## 2017-07-19 DIAGNOSIS — Z7951 Long term (current) use of inhaled steroids: Secondary | ICD-10-CM | POA: Insufficient documentation

## 2017-07-19 DIAGNOSIS — Z86010 Personal history of colonic polyps: Secondary | ICD-10-CM | POA: Insufficient documentation

## 2017-07-19 DIAGNOSIS — E7800 Pure hypercholesterolemia, unspecified: Secondary | ICD-10-CM | POA: Insufficient documentation

## 2017-07-19 DIAGNOSIS — Z79899 Other long term (current) drug therapy: Secondary | ICD-10-CM | POA: Diagnosis not present

## 2017-07-19 DIAGNOSIS — K449 Diaphragmatic hernia without obstruction or gangrene: Secondary | ICD-10-CM | POA: Insufficient documentation

## 2017-07-19 DIAGNOSIS — I480 Paroxysmal atrial fibrillation: Secondary | ICD-10-CM | POA: Diagnosis not present

## 2017-07-19 DIAGNOSIS — Z7901 Long term (current) use of anticoagulants: Secondary | ICD-10-CM | POA: Diagnosis not present

## 2017-07-19 DIAGNOSIS — Z6838 Body mass index (BMI) 38.0-38.9, adult: Secondary | ICD-10-CM | POA: Insufficient documentation

## 2017-07-19 HISTORY — PX: POLYPECTOMY: SHX5525

## 2017-07-19 HISTORY — PX: ESOPHAGOGASTRODUODENOSCOPY (EGD) WITH PROPOFOL: SHX5813

## 2017-07-19 HISTORY — DX: Cardiac arrhythmia, unspecified: I49.9

## 2017-07-19 HISTORY — DX: Prediabetes: R73.03

## 2017-07-19 SURGERY — ESOPHAGOGASTRODUODENOSCOPY (EGD) WITH PROPOFOL
Anesthesia: Monitor Anesthesia Care

## 2017-07-19 MED ORDER — LACTATED RINGERS IV SOLN
INTRAVENOUS | Status: DC
  Administered 2017-07-19: 1000 mL via INTRAVENOUS

## 2017-07-19 MED ORDER — PROPOFOL 10 MG/ML IV BOLUS
INTRAVENOUS | Status: AC
  Filled 2017-07-19: qty 40

## 2017-07-19 MED ORDER — PROPOFOL 500 MG/50ML IV EMUL
INTRAVENOUS | Status: DC | PRN
  Administered 2017-07-19: 75 ug/kg/min via INTRAVENOUS

## 2017-07-19 MED ORDER — PROPOFOL 10 MG/ML IV BOLUS
INTRAVENOUS | Status: DC | PRN
  Administered 2017-07-19: 50 mg via INTRAVENOUS

## 2017-07-19 MED ORDER — EPINEPHRINE PF 1 MG/10ML IJ SOSY
PREFILLED_SYRINGE | INTRAMUSCULAR | Status: DC | PRN
  Administered 2017-07-19: 4 mL

## 2017-07-19 MED ORDER — EPINEPHRINE PF 1 MG/10ML IJ SOSY
PREFILLED_SYRINGE | INTRAMUSCULAR | Status: AC
  Filled 2017-07-19: qty 10

## 2017-07-19 MED ORDER — ONDANSETRON HCL 4 MG/2ML IJ SOLN
INTRAMUSCULAR | Status: DC | PRN
  Administered 2017-07-19: 4 mg via INTRAVENOUS

## 2017-07-19 MED ORDER — LIDOCAINE 2% (20 MG/ML) 5 ML SYRINGE
INTRAMUSCULAR | Status: DC | PRN
  Administered 2017-07-19: 100 mg via INTRAVENOUS

## 2017-07-19 MED ORDER — SODIUM CHLORIDE 0.9 % IV SOLN: INTRAVENOUS | Status: DC

## 2017-07-19 SURGICAL SUPPLY — 14 items

## 2017-07-19 NOTE — Transfer of Care (Signed)
Immediate Anesthesia Transfer of Care Note  Patient: [name]  Procedure(s) Performed: ESOPHAGOGASTRODUODENOSCOPY (EGD) WITH PROPOFOL (N/A ) POLYPECTOMY (N/A )  Patient Location: PACU and Endoscopy Unit  Anesthesia Type:MAC  Level of Consciousness: awake, alert  and oriented  Airway & Oxygen Therapy: Patient Spontanous Breathing  Post-op Assessment: Post -op Vital signs reviewed and stable  Post vital signs: Reviewed and stable  Last Vitals:  Vitals Value Taken Time  BP    Temp 36.8 C 07/19/2017 11:05 AM  Pulse 80 07/19/2017 11:10 AM  Resp 20 07/19/2017 11:10 AM  SpO2 94 % 07/19/2017 11:10 AM  Vitals shown include unvalidated device data.  Last Pain:  Vitals:   07/19/17 1105  TempSrc: Oral  PainSc: 0-No pain         Complications: No apparent anesthesia complications

## 2017-07-19 NOTE — Interval H&P Note (Signed)
History and Physical Interval Note:  07/19/2017 10:11 AM  Weston Brass Broxterman  has presented today for surgery, with the diagnosis of gastric polyp  The various methods of treatment have been discussed with the patient and family. After consideration of risks, benefits and other options for treatment, the patient has consented to  Procedure(s): ESOPHAGOGASTRODUODENOSCOPY (EGD) WITH PROPOFOL (N/A) POLYPECTOMY (N/A) as a surgical intervention .  The patient's history has been reviewed, patient examined, no change in status, stable for surgery.  I have reviewed the patient's chart and labs.  Questions were answered to the patient's satisfaction.     [place]

## 2017-07-19 NOTE — Anesthesia Postprocedure Evaluation (Signed)
Anesthesia Post Note  Patient: Carroll Ranney Rempel  Procedure(s) Performed: ESOPHAGOGASTRODUODENOSCOPY (EGD) WITH PROPOFOL (N/A ) POLYPECTOMY (N/A )     Patient location during evaluation: PACU Anesthesia Type: MAC Level of consciousness: awake and alert Pain management: pain level controlled Vital Signs Assessment: post-procedure vital signs reviewed and stable Respiratory status: spontaneous breathing, nonlabored ventilation and respiratory function stable Cardiovascular status: stable and blood pressure returned to baseline Postop Assessment: no apparent nausea or vomiting Anesthetic complications: no    Last Vitals:  Vitals:   07/19/17 1105 07/19/17 1110  BP:  (!) 167/86  Pulse: 83 80  Resp: 18 20  Temp: 36.8 C   SpO2: 99% 94%    Last Pain:  Vitals:   07/19/17 1110  TempSrc:   PainSc: 0-No pain                 Lynda Rainwater

## 2017-07-19 NOTE — H&P (Signed)
HPI:   [name] is a 62 y.o. female with history of Coumadin use for history of DVT and atrial fibrillation.  She has had ablation therapies for atrial fibrillation.  Last echocardiogram shows EF of 65-70%.  She had a recent EGD in March showing multiple large gastric polyps. 2 polyps at the cardiac were ulcerated and at risk for bleeding while on anticoagulation. She is here for EGD with polypectomy of cardia polyps and to remove largest of gastric body polyps for sampling. Coumadin off 5 days, on lovenox bridge.   Past Medical History:  Diagnosis Date  . Allergic rhinitis   . Arthritis    "back" (05/07/2016)  . Asthma   . Atrial flutter ([place])    a. Remotely per notes.  . Carpal tunnel syndrome, bilateral   . Colon polyps   . Diverticulitis   . DVT (deep venous thrombosis) ([place]) 1980s x2 -    between birth of two sons. Saw hematology - was told she has a hypercoagulable disorder, should be on Coumadin lifelong.  Marland Kitchen Dysrhythmia    atrial fib  . Family history of adverse reaction to anesthesia    "mother gets PONV"  . Gallbladder disease   . GERD (gastroesophageal reflux disease)   . Halothane adverse reaction    narrow small opening  . Heart murmur   . Hiatal hernia   . History of hiatal hernia    "small one/CTA 05/01/2016" (05/07/2016)  . HTN (hypertension)   . Hypercholesteremia    hx; "brought it down w/diet" (05/07/2016)  . Hyperglycemia    a. A1c 6.1 in 2014.  . Inguinal hernia   . Left sided sciatica   . Normal cardiac stress test 06/2012  . Obesity   . OSA (obstructive sleep apnea)    a. Mild, did not tolerate CPAP (05/07/2016)  . PAF (paroxysmal atrial fibrillation) ([place])    a. s/p afib ablation [facility] 2010 (Dr. Annabell Howells). b. Recurrent AF s/p DCCV 06/2010. c. On flecainide.   Marland Kitchen PONV (postoperative nausea and vomiting)   . PPD positive, treated 1977   "treated for 1 yr after exposure to patient"  . Pre-diabetes   . RSV infection ~ 2006    . Wandering (atrial) pacemaker    a. Remotely per notes.  (  pt. states has a wandering p wave ) no pacemaker    Past Surgical History:  Procedure Laterality Date  . ATRIAL ABLATION SURGERY  05/07/2016   "fib and flutter"  . ATRIAL FIBRILLATION ABLATION  2010   at [facility]  . ATRIAL FIBRILLATION ABLATION N/A 05/07/2016   Procedure: Atrial Fibrillation Ablation;  Surgeon: Thompson Grayer, MD;  Location: [facility];  Service: Cardiovascular;  Laterality: N/A;  . BREAST CYST ASPIRATION Bilateral   . CARDIAC CATHETERIZATION  2008  . CARPAL TUNNEL RELEASE Bilateral   . CESAREAN SECTION  1986; 1988  . COLONOSCOPY  X 2  . COLONOSCOPY W/ BIOPSIES AND POLYPECTOMY  X 2  . ESOPHAGOGASTRODUODENOSCOPY    . INGUINAL HERNIA REPAIR Right   . LAPAROSCOPIC CHOLECYSTECTOMY  1989  . TMJ ARTHROPLASTY    . TUBAL LIGATION  1988    Family History  Problem Relation Age of Onset  . Stroke Mother   . Heart attack Mother        2 previous MIs, 3 stents - CAD first diagnosed 89  . Thyroid disease Mother   .  Colon polyps Father   . Lupus Sister   . Heart attack Paternal Grandfather        Died at 39 of massive MI  . Stroke Maternal Grandfather        Multiple family members  . Stroke Paternal Grandmother   . Thyroid disease Sister        x 2  . Colon cancer Neg Hx   . Stomach cancer Neg Hx      Social History   Tobacco Use  . Smoking status: Never Smoker  . Smokeless tobacco: Never Used  Substance Use Topics  . Alcohol use: Not Currently    Comment: 07-13-17  . Drug use: No    Prior to Admission medications   Medication Sig Start Date End Date Taking? Authorizing Provider  acetaminophen (TYLENOL) 650 MG CR tablet Take 1,300 mg by mouth 2 (two) times daily.   Yes [provider]  cetirizine (ZYRTEC) 10 MG tablet Take 10 mg by mouth daily.     Yes [provider]  Cholecalciferol (VITAMIN D3) 1000 UNITS CAPS Take 2,000 Units by mouth daily.    Yes [provider]  dicyclomine (BENTYL) 10 MG capsule Take 1 tablet by mouth 30 min before meals and at bedtime as needed for pain, cramping. 11/24/16  Yes Esterwood, Amy S, PA-C  enoxaparin (LOVENOX) 100 MG/ML injection Inject 1 mL (100 mg total) into the skin every 12 (twelve) hours. 06/07/17  Yes Allred, Jeneen Rinks, MD  FLOVENT HFA 220 MCG/ACT inhaler Inhale 1 puff into the lungs 2 (two) times daily. 04/20/16  Yes [provider]  fluticasone (FLONASE) 50 MCG/ACT nasal spray Place 1 spray into both nostrils 2 (two) times daily.  06/02/12  Yes [provider]  losartan (COZAAR) 25 MG tablet Take 1 tablet (25 mg total) by mouth daily. 12/20/13  Yes Allred, Jeneen Rinks, MD  Multiple Vitamin (MULTIVITAMIN WITH MINERALS) TABS tablet Take 1 tablet by mouth daily.   Yes [provider]  omeprazole (PRILOSEC) 40 MG capsule Take 40 mg by mouth 2 (two) times daily.   Yes [provider]  polyethylene glycol (MIRALAX) packet Take 17 g by mouth every evening.    Yes [provider]  PROAIR HFA 108 (90 BASE) MCG/ACT inhaler Inhale 2 puffs into the lungs every 6 (six) hours as needed for wheezing or shortness of breath.  03/22/12  Yes [provider]  venlafaxine XR (EFFEXOR-XR) 37.5 MG 24 hr capsule Take 37.5 mg by mouth daily with breakfast.  11/21/12  Yes [provider]  verapamil (CALAN) 80 MG tablet Take 160 mg by mouth 2 (two) times daily.   Yes [provider]  warfarin (COUMADIN) 2 MG tablet TAKE AS DIRECTED BY COUMADIN CLINIC Patient taking differently: Take 4-6 mg by mouth See admin instructions. Take 6 mg by mouth in the morning on Sun/Tues/Thurs/Sat and 4 mg on Mon/Wed/Fri 04/14/17  Yes Allred, Jeneen Rinks, MD  sucralfate (CARAFATE) 1 GM/10ML suspension Take 10 mLs (1 g total) by mouth every 6 (six) hours as needed. Patient taking differently: Take 1 g by mouth every 6 (six) hours as needed (for GERD symptoms).  06/15/17   Ladainian Therien, Carlota Raspberry, MD     Current Facility-Administered Medications  Medication Dose Route Frequency Provider Last Rate Last Dose  . 0.9 %  sodium chloride infusion   Intravenous Continuous Kiran Carline, Carlota Raspberry, MD      . lactated ringers infusion   Intravenous Continuous Ryeleigh Santore, Carlota Raspberry, MD 10  mL/hr at 07/19/17 0913 1,000 mL at 07/19/17 0913    Allergies as of 06/23/2017 - Review Complete 06/22/2017  Allergen Reaction Noted  . Aspirin Other (See Comments) 01/12/2013  . Ivp dye [iodinated diagnostic agents] Anaphylaxis 01/12/2013  . Latex Other (See Comments)   . Meperidine hcl Other (See Comments) 09/10/2009  . Penicillins Anaphylaxis 09/10/2009  . Statins Other (See Comments) 01/12/2013  . Sulfonamide derivatives Anaphylaxis 09/10/2009  . Food  05/04/2016     Review of Systems:    As per HPI, otherwise negative    Physical Exam:  Vital signs in last 24 hours: Temp:  [98.1 F (36.7 C)] 98.1 F (36.7 C) (04/29 0906) Resp:  [14] 14 (04/29 0906) BP: (152)/(68) 152/68 (04/29 0906) SpO2:  [95 %] 95 % (04/29 0906)   General:   Pleasant female in NAD Lungs:  Respirations even and unlabored. Lungs clear to auscultation bilaterally.    Heart:  Regular rate and rhythm Abdomen:  Soft, nondistended, nontender.  Extremities:  Without edema. Neurologic:  Alert and  oriented x4;  grossly normal neurologically. Psych:  Alert and cooperative. Normal affect.             Lab Results  Component Value Date   WBC 9.6 07/08/2017   HGB 11.7 (L) 07/08/2017   HCT 36.0 07/08/2017   MCV 89.8 07/08/2017   PLT 286 07/08/2017    Lab Results  Component Value Date   CREATININE 0.75 07/08/2017   BUN 11 07/08/2017   NA 136 07/08/2017   K 4.6 07/08/2017   CL 104 07/08/2017   CO2 23 07/08/2017    Lab Results  Component Value Date   ALT 32 07/07/2017   AST 33 07/07/2017   ALKPHOS 75 07/07/2017   BILITOT 0.5 07/07/2017      Impression / Plan:   62 y/o female with multiple large gastric polyps, some  ulcerated, on anticoagulation. Here for EGD with polypectomy as outlined above. I have discussed risks / benefits of EGD with the patient. She is off coumadin for 5 days and on lovenox bridge. She wishes to proceed. Further recommendations pending the results.   [place] Cellar, MD [facility]

## 2017-07-19 NOTE — Anesthesia Preprocedure Evaluation (Signed)
Anesthesia Evaluation  Patient identified by MRN, date of birth, ID band Patient awake    Reviewed: Allergy & Precautions, NPO status , Patient's Chart, lab work & pertinent test results  History of Anesthesia Complications (+) PONV  Airway Mallampati: III  TM Distance: >3 FB Neck ROM: Full    Dental  (+) Dental Advisory Given, Chipped   Pulmonary asthma , sleep apnea (does not need CPAP since mild) ,    breath sounds clear to auscultation       Cardiovascular hypertension, Pt. on medications (-) angina+ DVT  + dysrhythmias Atrial Fibrillation  Rhythm:Regular Rate:Normal  '17 ECHO: EF 65-70%, valves OK   Neuro/Psych negative neurological ROS     GI/Hepatic Neg liver ROS, GERD  Medicated and Controlled,  Endo/Other  Morbid obesity  Renal/GU negative Renal ROS     Musculoskeletal   Abdominal (+) + obese,   Peds  Hematology  (+) Blood dyscrasia, , Coumadin: INR 2.01   Anesthesia Other Findings   Reproductive/Obstetrics                             Anesthesia Physical  Anesthesia Plan  ASA: III  Anesthesia Plan: MAC   Post-op Pain Management:    Induction: Intravenous  PONV Risk Score and Plan: 3 and Treatment may vary due to age or medical condition  Airway Management Planned:   Additional Equipment:   Intra-op Plan:   Post-operative Plan:   Informed Consent: I have reviewed the patients History and Physical, chart, labs and discussed the procedure including the risks, benefits and alternatives for the proposed anesthesia with the patient or authorized representative who has indicated his/her understanding and acceptance.   Dental advisory given  Plan Discussed with: CRNA and Surgeon  Anesthesia Plan Comments:         Anesthesia Quick Evaluation

## 2017-07-19 NOTE — Op Note (Signed)
[facility] Patient Name: [name] Procedure Date: 07/19/2017 MRN: [id number] Attending MD: Carlota Raspberry. Mirenda Baltazar MD, MD Date of Birth: 09-09-1955 CSN: [id number] Age: 62 Admit Type: Outpatient Procedure:                Upper GI endoscopy Indications:              For therapy of gastric polyps Providers:                Remo Lipps P. Shizue Kaseman MD, MD, Cleda Daub, RN,                            Cherylynn Ridges, Technician, Stephanie [place] CRNA Referring MD:              Medicines:                Monitored Anesthesia Care Complications:            No immediate complications. Estimated blood loss:                            Minimal. Estimated Blood Loss:     Estimated blood loss was minimal. Procedure:                Pre-Anesthesia Assessment:                           - Prior to the procedure, a History and Physical                            was performed, and patient medications and                            allergies were reviewed. The patient's tolerance of                            previous anesthesia was also reviewed. The risks                            and benefits of the procedure and the sedation                            options and risks were discussed with the patient.                            All questions were answered, and informed consent                            was obtained. Prior Anticoagulants: The patient has                            taken Lovenox (enoxaparin), last dose was 1 day                            prior to procedure. After reviewing the risks and  benefits, the patient was deemed in satisfactory                            condition to undergo the procedure.                           After obtaining informed consent, the endoscope was                            passed under direct vision. Throughout the                            procedure, the patient's blood pressure, pulse, and           oxygen saturations were monitored continuously. The                            EG-[id number] ([id number]) scope was introduced through the                            mouth, and advanced to the second part of duodenum.                            The upper GI endoscopy was accomplished without                            difficulty. The patient tolerated the procedure                            well. Scope In: Scope Out: Findings:      Esophagogastric landmarks were identified: the Z-line was found at 34       cm, the gastroesophageal junction was found at 34 cm and the upper       extent of the gastric folds was found at 37 cm from the incisors.      A 3 cm hiatal hernia was present.      The exam of the esophagus was otherwise normal.      Three medium sized sessile polyps (5-510m in size) in close proximity to       each other were found in the cardia, within the hernia sac, (>1cm in       size total aggregate) appeared inflamed and diffusely ulcerated. Area       was successfully injected with of a 1:10,000 solution of epinephrine for       drug delivery prior to polypectomy. These polyps were removed with a hot       snare. Resection and retrieval were complete. There was some mild       bleeding post polypectomy. Three hemostatic clips were successfully       placed across the sites post polypectomy. There was no bleeding at the       end of the procedure.      Multiple large gastric polyps were noted in the gastric body, ranging in       size from 5-150min size. Three representative polyps were removed (2       erythematous / inflamed each 5-10m21mn size, and the largest polyp 30m60m  in size). These polyps were removed with a hot snare. Resection and       retrieval were complete.      The exam of the stomach was otherwise normal.      The duodenal bulb and second portion of the duodenum were normal. Impression:               - Esophagogastric landmarks identified.                            - 3 cm hiatal hernia.                           - Normal esophagus                           - Three gastric polyps in the cardia / hernia sac,                            ulcerated / inflamed. Injected with epinephrine and                            then resected and retrieved. 3 hemostasis clips                            were placed.                           - Multiple large gastric body polyps. 3                            representative samples resected and retrieved.                           - Normal duodenal bulb and second portion of the                            duodenum. Moderate Sedation:      No moderate sedation, case performed with MAC Recommendation:           - Patient has a contact number available for                            emergencies. The signs and symptoms of potential                            delayed complications were discussed with the                            patient. Return to normal activities tomorrow.                            Written discharge instructions were provided to the                            patient.                           -  Full liquid diet today, then soft diet for 2                            days, then advance as tolerated                           - Continue present medications. Continue omeprazole                           - Resume Coumadin tonight                           - Resume lovenox tomorrow                           - Await pathology results. Procedure Code(s):        --- Professional ---                           [phone], Esophagogastroduodenoscopy, flexible,                            transoral; with removal of tumor(s), polyp(s), or                            other lesion(s) by snare technique                           [id number], Esophagogastroduodenoscopy, flexible,                            transoral; with directed submucosal injection(s),                            any substance Diagnosis Code(s):         --- Professional ---                           K44.9, Diaphragmatic hernia without obstruction or                            gangrene                           K31.7, Polyp of stomach and duodenum CPT copyright 2017 American Medical Association. All rights reserved. The codes documented in this report are preliminary and upon coder review may  be revised to meet current compliance requirements. Remo Lipps P. Lauriel Helin MD, MD 07/19/2017 11:10:07 AM This report has been signed electronically. Number of Addenda: 0

## 2017-07-19 NOTE — Discharge Instructions (Signed)
YOU HAD AN ENDOSCOPIC PROCEDURE TODAY: Refer to the procedure report and other information in the discharge instructions given to you for any specific questions about what was found during the examination. If this information does not answer your questions, please call [place] office at [phone] to clarify.  ° °YOU SHOULD EXPECT: Some feelings of bloating in the abdomen. Passage of more gas than usual. Walking can help get rid of the air that was put into your GI tract during the procedure and reduce the bloating. If you had a lower endoscopy (such as a colonoscopy or flexible sigmoidoscopy) you may notice spotting of blood in your stool or on the toilet paper. Some abdominal soreness may be present for a day or two, also. ° °DIET: Your first meal following the procedure should be a light meal and then it is ok to progress to your normal diet. A half-sandwich or bowl of soup is an example of a good first meal. Heavy or fried foods are harder to digest and may make you feel nauseous or bloated. Drink plenty of fluids but you should avoid alcoholic beverages for 24 hours. If you had a esophageal dilation, please see attached instructions for diet.   ° °ACTIVITY: Your care partner should take you home directly after the procedure. You should plan to take it easy, moving slowly for the rest of the day. You can resume normal activity the day after the procedure however YOU SHOULD NOT DRIVE, use power tools, machinery or perform tasks that involve climbing or major physical exertion for 24 hours (because of the sedation medicines used during the test).  ° °SYMPTOMS TO REPORT IMMEDIATELY: °A gastroenterologist can be reached at any hour. Please call [phone]  for any of the following symptoms:  °Following lower endoscopy (colonoscopy, flexible sigmoidoscopy) °Excessive amounts of blood in the stool  °Significant tenderness, worsening of abdominal pains  °Swelling of the abdomen that is new, acute  °Fever of 100° or  higher  °Following upper endoscopy (EGD, EUS, ERCP, esophageal dilation) °Vomiting of blood or coffee ground material  °New, significant abdominal pain  °New, significant chest pain or pain under the shoulder blades  °Painful or persistently difficult swallowing  °New shortness of breath  °Black, tarry-looking or red, bloody stools ° °FOLLOW UP:  °If any biopsies were taken you will be contacted by phone or by letter within the next 1-3 weeks. Call [phone]  if you have not heard about the biopsies in 3 weeks.  °Please also call with any specific questions about appointments or follow up tests. ° °

## 2017-07-20 ENCOUNTER — Encounter (HOSPITAL_COMMUNITY): Payer: Self-pay | Admitting: Gastroenterology

## 2017-07-21 ENCOUNTER — Encounter: Payer: Self-pay | Admitting: Gastroenterology

## 2017-07-23 ENCOUNTER — Ambulatory Visit (INDEPENDENT_AMBULATORY_CARE_PROVIDER_SITE_OTHER): Payer: 59 | Admitting: *Deleted

## 2017-07-23 DIAGNOSIS — S0101XD Laceration without foreign body of scalp, subsequent encounter: Secondary | ICD-10-CM | POA: Diagnosis not present

## 2017-07-23 DIAGNOSIS — I480 Paroxysmal atrial fibrillation: Secondary | ICD-10-CM

## 2017-07-23 DIAGNOSIS — I4891 Unspecified atrial fibrillation: Secondary | ICD-10-CM

## 2017-07-23 DIAGNOSIS — Z5181 Encounter for therapeutic drug level monitoring: Secondary | ICD-10-CM

## 2017-07-23 DIAGNOSIS — Z8719 Personal history of other diseases of the digestive system: Secondary | ICD-10-CM | POA: Diagnosis not present

## 2017-07-23 LAB — POCT INR: INR: 1.5

## 2017-07-23 NOTE — Patient Instructions (Signed)
Description   Today May 3rd take 6mg  then tomorrow May 4th take 8mg  then continue  same dose of coumadin 6mg  daily except 4mg  on Mondays, Wednesdays, and Fridays. Recheck INR Friday May 10th .  Call [facility] [phone] Main # [phone] with any questions

## 2017-07-29 ENCOUNTER — Ambulatory Visit (INDEPENDENT_AMBULATORY_CARE_PROVIDER_SITE_OTHER): Payer: 59 | Admitting: *Deleted

## 2017-07-29 DIAGNOSIS — Z5181 Encounter for therapeutic drug level monitoring: Secondary | ICD-10-CM | POA: Diagnosis not present

## 2017-07-29 DIAGNOSIS — I480 Paroxysmal atrial fibrillation: Secondary | ICD-10-CM

## 2017-07-29 DIAGNOSIS — I4891 Unspecified atrial fibrillation: Secondary | ICD-10-CM | POA: Diagnosis not present

## 2017-07-29 LAB — POCT INR: INR: 1.8

## 2017-07-29 NOTE — Patient Instructions (Addendum)
07/31/17: Last dose of Coumadin.  08/01/17: No Coumadin or Lovenox.  08/02/17: Inject Lovenox 100mg  in the fatty abdominal tissue at least 2 inches from the belly button twice a day about 12 hours apart, 8am and 8pm rotate sites. No Coumadin.  08/03/17: Inject Lovenox in the fatty tissue every 12 hours, 8am and 8pm. No Coumadin.  08/04/17: Inject Lovenox in the fatty tissue every 12 hours, 8am and 8pm. No Coumadin.  08/05/17: Inject Lovenox in the fatty tissue in the morning at 8 am (No PM dose). No Coumadin.  08/06/17: Procedure Day - No Lovenox - Resume Coumadin in the evening or as directed by doctor (pt will be admitted for a couple of days)   Call when you get Discharged [phone]   08/12/17: Coumadin appt to check INR. Description   Continue taking same dose of coumadin 6mg  daily except 4mg  on Mondays, Wednesdays, and Fridays. Refer to pt instructions.  Recheck INR 1 week after procedure.  Call [facility]in Clinic [phone] Main # [phone] with any questions

## 2017-07-29 NOTE — Progress Notes (Signed)
06-22-17 (Epic) Cardiac Clearance from Rosaria Ferries, PA  07-08-17 (Epic) EKG  06-26-17 (Epic) CXR  09-02-15 (Epic) ECHO

## 2017-07-29 NOTE — Patient Instructions (Addendum)
[name]  [birth year]   Your procedure is scheduled on: 08-06-17   Report to [facility] Main  Entrance     Report to admitting at 5:30 AM    Call this number if you have problems the morning of surgery [phone]   Remember:DRINK 2 PRESURGERY ENSURE DRINKS THE NIGHT BEFORE SURGERY AT 1000 PM AND 1 PRESURGERY DRINK THE DAY OF THE PROCEDURE 3 HOURS PRIOR TO SCHEDULED SURGERY. NO SOLIDS AFTER MIDNIGHT THE DAY PRIOR TO THE SURGERY. NOTHING BY MOUTH EXCEPT CLEAR LIQUIDS UNTIL THREE HOURS PRIOR TO SCHEDULED SURGERY. PLEASE FINISH PRESURGERY ENSURE DRINK PER SURGEON ORDER 3 HOURS PRIOR TO SCHEDULED SURGERY TIME WHICH NEEDS TO BE COMPLETED AT 4:30 AM.    CLEAR LIQUID DIET   Foods Allowed                                                                     Foods Excluded  Coffee and tea, regular and decaf                             liquids that you cannot  Plain Jell-O in any flavor                                             see through such as: Fruit ices (not with fruit pulp)                                     milk, soups, orange juice  Iced Popsicles                                    All solid food Carbonated beverages, regular and diet                                    Cranberry, grape and apple juices Sports drinks like Gatorade Lightly seasoned clear broth or consume(fat free) Sugar, honey syrup  Sample Menu Breakfast                                Lunch                                     Supper Cranberry juice                    Beef broth                            Chicken broth Jell-O  Grape juice                           Apple juice Coffee or tea                        Jell-O                                      Popsicle                                                Coffee or tea                        Coffee or tea  _____________________________________________________________________    Take these  medicines the morning of surgery with A SIP OF WATER: Cetirizine (Zyrtec), Omeprazole (Prilosec), and Venlafaxine XR                                You may not have any metal on your body including hair pins and              piercings  Do not wear jewelry, make-up, lotions, powders or perfumes, deodorant             Do not wear nail polish.  Do not shave  48 hours prior to surgery.     Do not bring valuables to the hospital. [place].  Contacts, dentures or bridgework may not be worn into surgery.  Leave suitcase in the car. After surgery it may be brought to your room.     Special Instructions:Please consume a Clear Liquid Diet the Day of Prep              Please read over the following fact sheets you were given: _____________________________________________________________________           [facility] - Preparing for Surgery Before surgery, you can play an important role.  Because skin is not sterile, your skin needs to be as free of germs as possible.  You can reduce the number of germs on your skin by washing with CHG (chlorahexidine gluconate) soap before surgery.  CHG is an antiseptic cleaner which kills germs and bonds with the skin to continue killing germs even after washing. Please DO NOT use if you have an allergy to CHG or antibacterial soaps.  If your skin becomes reddened/irritated stop using the CHG and inform your nurse when you arrive at Short Stay. Do not shave (including legs and underarms) for at least 48 hours prior to the first CHG shower.  You may shave your face/neck. Please follow these instructions carefully:  1.  Shower with CHG Soap the night before surgery and the  morning of Surgery.  2.  If you choose to wash your hair, wash your hair first as usual with your  normal  shampoo.  3.  After you shampoo, rinse your hair and body thoroughly to remove the  shampoo.  4.  Use CHG as you  would any other liquid soap.  You can apply chg directly  to the skin and wash                       Gently with a scrungie or clean washcloth.  5.  Apply the CHG Soap to your body ONLY FROM THE NECK DOWN.   Do not use on face/ open                           Wound or open sores. Avoid contact with eyes, ears mouth and genitals (private parts).                       Wash face,  Genitals (private parts) with your normal soap.             6.  Wash thoroughly, paying special attention to the area where your surgery  will be performed.  7.  Thoroughly rinse your body with warm water from the neck down.  8.  DO NOT shower/wash with your normal soap after using and rinsing off  the CHG Soap.                9.  Pat yourself dry with a clean towel.            10.  Wear clean pajamas.            11.  Place clean sheets on your bed the night of your first shower and do not  sleep with pets. Day of Surgery : Do not apply any lotions/deodorants the morning of surgery.  Please wear clean clothes to the hospital/surgery center.  FAILURE TO FOLLOW THESE INSTRUCTIONS MAY RESULT IN THE CANCELLATION OF YOUR SURGERY PATIENT SIGNATURE_________________________________  NURSE SIGNATURE__________________________________  ________________________________________________________________________  WHAT IS A BLOOD TRANSFUSION? Blood Transfusion Information  A transfusion is the replacement of blood or some of its parts. Blood is made up of multiple cells which provide different functions.  Red blood cells carry oxygen and are used for blood loss replacement.  White blood cells fight against infection.  Platelets control bleeding.  Plasma helps clot blood.  Other blood products are available for specialized needs, such as hemophilia or other clotting disorders. BEFORE THE TRANSFUSION  Who gives blood for transfusions?   Healthy volunteers who are fully evaluated to make sure their blood is safe. This is blood  bank blood. Transfusion therapy is the safest it has ever been in the practice of medicine. Before blood is taken from a donor, a complete history is taken to make sure that person has no history of diseases nor engages in risky social behavior (examples are intravenous drug use or sexual activity with multiple partners). The donor's travel history is screened to minimize risk of transmitting infections, such as malaria. The donated blood is tested for signs of infectious diseases, such as HIV and hepatitis. The blood is then tested to be sure it is compatible with you in order to minimize the chance of a transfusion reaction. If you or a relative donates blood, this is often done in anticipation of surgery and is not appropriate for emergency situations. It takes many days to process the donated blood. RISKS AND COMPLICATIONS Although transfusion therapy is very safe and saves many lives, the main dangers of transfusion include:   Getting an infectious disease.  Developing a transfusion reaction.  This is an allergic reaction to something in the blood you were given. Every precaution is taken to prevent this. The decision to have a blood transfusion has been considered carefully by your caregiver before blood is given. Blood is not given unless the benefits outweigh the risks. AFTER THE TRANSFUSION  Right after receiving a blood transfusion, you will usually feel much better and more energetic. This is especially true if your red blood cells have gotten low (anemic). The transfusion raises the level of the red blood cells which carry oxygen, and this usually causes an energy increase.  The nurse administering the transfusion will monitor you carefully for complications. HOME CARE INSTRUCTIONS  No special instructions are needed after a transfusion. You may find your energy is better. Speak with your caregiver about any limitations on activity for underlying diseases you may have. SEEK MEDICAL CARE  IF:   Your condition is not improving after your transfusion.  You develop redness or irritation at the intravenous (IV) site. SEEK IMMEDIATE MEDICAL CARE IF:  Any of the following symptoms occur over the next 12 hours:  Shaking chills.  You have a temperature by mouth above 102 F (38.9 C), not controlled by medicine.  Chest, back, or muscle pain.  People around you feel you are not acting correctly or are confused.  Shortness of breath or difficulty breathing.  Dizziness and fainting.  You get a rash or develop hives.  You have a decrease in urine output.  Your urine turns a dark color or changes to pink, red, or brown. Any of the following symptoms occur over the next 10 days:  You have a temperature by mouth above 102 F (38.9 C), not controlled by medicine.  Shortness of breath.  Weakness after normal activity.  The white part of the eye turns yellow (jaundice).  You have a decrease in the amount of urine or are urinating less often.  Your urine turns a dark color or changes to pink, red, or brown. Document Released: 03/06/2000 Document Revised: 06/01/2011 Document Reviewed: 10/24/2007 [facility] Patient Information 2014 [place], [place].  _______________________________________________________________________

## 2017-07-30 DIAGNOSIS — J309 Allergic rhinitis, unspecified: Secondary | ICD-10-CM | POA: Diagnosis not present

## 2017-07-30 DIAGNOSIS — E782 Mixed hyperlipidemia: Secondary | ICD-10-CM | POA: Diagnosis not present

## 2017-07-30 DIAGNOSIS — E1169 Type 2 diabetes mellitus with other specified complication: Secondary | ICD-10-CM | POA: Diagnosis not present

## 2017-07-30 DIAGNOSIS — J45909 Unspecified asthma, uncomplicated: Secondary | ICD-10-CM | POA: Diagnosis not present

## 2017-07-30 DIAGNOSIS — K219 Gastro-esophageal reflux disease without esophagitis: Secondary | ICD-10-CM | POA: Diagnosis not present

## 2017-07-30 DIAGNOSIS — I10 Essential (primary) hypertension: Secondary | ICD-10-CM | POA: Diagnosis not present

## 2017-07-30 DIAGNOSIS — N951 Menopausal and female climacteric states: Secondary | ICD-10-CM | POA: Diagnosis not present

## 2017-07-30 DIAGNOSIS — Z8719 Personal history of other diseases of the digestive system: Secondary | ICD-10-CM | POA: Diagnosis not present

## 2017-07-30 DIAGNOSIS — S0101XD Laceration without foreign body of scalp, subsequent encounter: Secondary | ICD-10-CM | POA: Diagnosis not present

## 2017-08-02 ENCOUNTER — Encounter (HOSPITAL_COMMUNITY)
Admission: RE | Admit: 2017-08-02 | Discharge: 2017-08-02 | Disposition: A | Discharge status: Home / Self Care | Payer: 59 | Source: Ambulatory Visit | Attending: Surgery | Admitting: Surgery

## 2017-08-02 ENCOUNTER — Encounter (HOSPITAL_COMMUNITY): Payer: Self-pay

## 2017-08-02 ENCOUNTER — Other Ambulatory Visit: Payer: Self-pay

## 2017-08-02 DIAGNOSIS — Z0183 Encounter for blood typing: Secondary | ICD-10-CM | POA: Insufficient documentation

## 2017-08-02 DIAGNOSIS — Z01818 Encounter for other preprocedural examination: Secondary | ICD-10-CM | POA: Diagnosis not present

## 2017-08-02 DIAGNOSIS — Z01812 Encounter for preprocedural laboratory examination: Secondary | ICD-10-CM | POA: Diagnosis not present

## 2017-08-02 DIAGNOSIS — K5732 Diverticulitis of large intestine without perforation or abscess without bleeding: Secondary | ICD-10-CM | POA: Diagnosis not present

## 2017-08-02 LAB — BASIC METABOLIC PANEL
Anion gap: 9 (ref 5–15)
BUN: 10 mg/dL (ref 6–20)
CHLORIDE: 101 mmol/L (ref 101–111)
CO2: 25 mmol/L (ref 22–32)
Calcium: 8.7 mg/dL — ABNORMAL LOW (ref 8.9–10.3)
Creatinine, Ser: 0.63 mg/dL (ref 0.44–1.00)
GFR calc non Af Amer: >60 mL/min (ref >60)
Glucose, Bld: 100 mg/dL — ABNORMAL HIGH (ref 65–99)
POTASSIUM: 3.5 mmol/L (ref 3.5–5.1)
SODIUM: 135 mmol/L (ref 135–145)

## 2017-08-02 LAB — HEMOGLOBIN A1C
Hgb A1c MFr Bld: 6.1 % — ABNORMAL HIGH (ref 4.8–5.6)
Mean Plasma Glucose: 128.37 mg/dL

## 2017-08-02 LAB — CBC
HEMATOCRIT: 34.9 % — AB (ref 36.0–46.0)
Hemoglobin: 11.2 g/dL — ABNORMAL LOW (ref 12.0–15.0)
MCH: 28.4 pg (ref 26.0–34.0)
MCHC: 32.1 g/dL (ref 30.0–36.0)
MCV: 88.6 fL (ref 78.0–100.0)
PLATELETS: 463 10*3/uL — AB (ref 150–400)
RBC: 3.94 MIL/uL (ref 3.87–5.11)
RDW: 13.6 % (ref 11.5–15.5)
WBC: 6.4 10*3/uL (ref 4.0–10.5)

## 2017-08-02 LAB — ABO/RH: ABO/RH(D): A NEG

## 2017-08-05 MED ORDER — SODIUM CHLORIDE 0.9 % IV SOLN
INTRAVENOUS | Status: DC
  Filled 2017-08-05: qty 6

## 2017-08-05 MED ORDER — GENTAMICIN SULFATE 40 MG/ML IJ SOLN
5.0000 mg/kg | INTRAMUSCULAR | Status: AC
  Administered 2017-08-06: 380 mg via INTRAVENOUS
  Filled 2017-08-05: qty 9.5

## 2017-08-05 MED ORDER — BUPIVACAINE LIPOSOME 1.3 % IJ SUSP
20.0000 mL | INTRAMUSCULAR | Status: DC
  Filled 2017-08-05: qty 20

## 2017-08-05 MED ORDER — CLINDAMYCIN PHOSPHATE 900 MG/50ML IV SOLN
900.0000 mg | INTRAVENOUS | Status: AC
  Administered 2017-08-06: 900 mg via INTRAVENOUS
  Filled 2017-08-05: qty 50

## 2017-08-05 NOTE — Anesthesia Preprocedure Evaluation (Addendum)
Anesthesia Evaluation  Patient identified by MRN, date of birth, ID band Patient awake    Reviewed: Allergy & Precautions, NPO status , Patient's Chart, lab work & pertinent test results  History of Anesthesia Complications (+) PONV  Airway Mallampati: III  TM Distance: >3 FB Neck ROM: Full    Dental  (+) Dental Advisory Given, Chipped   Pulmonary asthma , sleep apnea (does not need CPAP since mild) ,    breath sounds clear to auscultation       Cardiovascular hypertension, Pt. on medications (-) angina+ DVT  + dysrhythmias Atrial Fibrillation  Rhythm:Regular Rate:Normal  '17 ECHO: EF 65-70%, valves OK   Neuro/Psych negative neurological ROS     GI/Hepatic Neg liver ROS, GERD  Medicated and Controlled,  Endo/Other  Morbid obesity  Renal/GU negative Renal ROS     Musculoskeletal   Abdominal (+) + obese,   Peds  Hematology  (+) Blood dyscrasia, , Coumadin: INR 2.01   Anesthesia Other Findings All: ASA, Latex  Reproductive/Obstetrics                             Lab Results  Component Value Date   WBC 6.4 08/02/2017   HGB 11.2 (L) 08/02/2017   HCT 34.9 (L) 08/02/2017   MCV 88.6 08/02/2017   PLT 463 (H) 08/02/2017    Anesthesia Physical Anesthesia Plan  ASA: III  Anesthesia Plan: General   Post-op Pain Management:    Induction: Inhalational  PONV Risk Score and Plan: 3 and Treatment may vary due to age or medical condition  Airway Management Planned: Oral ETT  Additional Equipment:   Intra-op Plan:   Post-operative Plan: Extubation in OR  Informed Consent: I have reviewed the patients History and Physical, chart, labs and discussed the procedure including the risks, benefits and alternatives for the proposed anesthesia with the patient or authorized representative who has indicated his/her understanding and acceptance.   Dental advisory given  Plan Discussed with:  CRNA  Anesthesia Plan Comments:         Anesthesia Quick Evaluation

## 2017-08-06 ENCOUNTER — Other Ambulatory Visit: Payer: Self-pay

## 2017-08-06 ENCOUNTER — Inpatient Hospital Stay (HOSPITAL_COMMUNITY)
Admission: RE | Admit: 2017-08-06 | Discharge: 2017-08-11 | DRG: 331 | Disposition: A | Discharge status: Home / Self Care | Payer: 59 | Source: Ambulatory Visit | Attending: Surgery | Admitting: Surgery

## 2017-08-06 ENCOUNTER — Inpatient Hospital Stay (HOSPITAL_COMMUNITY): Payer: 59 | Admitting: Anesthesiology

## 2017-08-06 ENCOUNTER — Encounter (HOSPITAL_COMMUNITY)
Admission: RE | Disposition: A | Discharge status: Home / Self Care | Payer: Self-pay | Source: Ambulatory Visit | Attending: Surgery

## 2017-08-06 ENCOUNTER — Encounter (HOSPITAL_COMMUNITY): Payer: Self-pay | Admitting: *Deleted

## 2017-08-06 DIAGNOSIS — Z885 Allergy status to narcotic agent status: Secondary | ICD-10-CM | POA: Diagnosis not present

## 2017-08-06 DIAGNOSIS — Z7951 Long term (current) use of inhaled steroids: Secondary | ICD-10-CM | POA: Diagnosis not present

## 2017-08-06 DIAGNOSIS — Z79899 Other long term (current) drug therapy: Secondary | ICD-10-CM

## 2017-08-06 DIAGNOSIS — M4690 Unspecified inflammatory spondylopathy, site unspecified: Secondary | ICD-10-CM | POA: Diagnosis present

## 2017-08-06 DIAGNOSIS — Z91048 Other nonmedicinal substance allergy status: Secondary | ICD-10-CM

## 2017-08-06 DIAGNOSIS — F329 Major depressive disorder, single episode, unspecified: Secondary | ICD-10-CM | POA: Diagnosis present

## 2017-08-06 DIAGNOSIS — Z825 Family history of asthma and other chronic lower respiratory diseases: Secondary | ICD-10-CM

## 2017-08-06 DIAGNOSIS — Z886 Allergy status to analgesic agent status: Secondary | ICD-10-CM

## 2017-08-06 DIAGNOSIS — Z882 Allergy status to sulfonamides status: Secondary | ICD-10-CM

## 2017-08-06 DIAGNOSIS — Z91040 Latex allergy status: Secondary | ICD-10-CM

## 2017-08-06 DIAGNOSIS — E876 Hypokalemia: Secondary | ICD-10-CM | POA: Diagnosis present

## 2017-08-06 DIAGNOSIS — K5732 Diverticulitis of large intestine without perforation or abscess without bleeding: Secondary | ICD-10-CM | POA: Diagnosis not present

## 2017-08-06 DIAGNOSIS — Z8261 Family history of arthritis: Secondary | ICD-10-CM

## 2017-08-06 DIAGNOSIS — R7611 Nonspecific reaction to tuberculin skin test without active tuberculosis: Secondary | ICD-10-CM | POA: Diagnosis present

## 2017-08-06 DIAGNOSIS — Z91041 Radiographic dye allergy status: Secondary | ICD-10-CM

## 2017-08-06 DIAGNOSIS — Z9049 Acquired absence of other specified parts of digestive tract: Secondary | ICD-10-CM

## 2017-08-06 DIAGNOSIS — I480 Paroxysmal atrial fibrillation: Secondary | ICD-10-CM | POA: Diagnosis present

## 2017-08-06 DIAGNOSIS — Z8371 Family history of colonic polyps: Secondary | ICD-10-CM

## 2017-08-06 DIAGNOSIS — K5730 Diverticulosis of large intestine without perforation or abscess without bleeding: Secondary | ICD-10-CM | POA: Diagnosis not present

## 2017-08-06 DIAGNOSIS — K219 Gastro-esophageal reflux disease without esophagitis: Secondary | ICD-10-CM | POA: Diagnosis present

## 2017-08-06 DIAGNOSIS — G4733 Obstructive sleep apnea (adult) (pediatric): Secondary | ICD-10-CM | POA: Diagnosis present

## 2017-08-06 DIAGNOSIS — E669 Obesity, unspecified: Secondary | ICD-10-CM | POA: Diagnosis present

## 2017-08-06 DIAGNOSIS — Z6838 Body mass index (BMI) 38.0-38.9, adult: Secondary | ICD-10-CM | POA: Diagnosis not present

## 2017-08-06 DIAGNOSIS — J45909 Unspecified asthma, uncomplicated: Secondary | ICD-10-CM | POA: Diagnosis present

## 2017-08-06 DIAGNOSIS — Z86010 Personal history of colonic polyps: Secondary | ICD-10-CM

## 2017-08-06 DIAGNOSIS — Z7901 Long term (current) use of anticoagulants: Secondary | ICD-10-CM | POA: Diagnosis not present

## 2017-08-06 DIAGNOSIS — Z9851 Tubal ligation status: Secondary | ICD-10-CM

## 2017-08-06 DIAGNOSIS — Z888 Allergy status to other drugs, medicaments and biological substances status: Secondary | ICD-10-CM

## 2017-08-06 DIAGNOSIS — I10 Essential (primary) hypertension: Secondary | ICD-10-CM | POA: Diagnosis not present

## 2017-08-06 DIAGNOSIS — Z91018 Allergy to other foods: Secondary | ICD-10-CM

## 2017-08-06 DIAGNOSIS — Z87892 Personal history of anaphylaxis: Secondary | ICD-10-CM

## 2017-08-06 DIAGNOSIS — Z8249 Family history of ischemic heart disease and other diseases of the circulatory system: Secondary | ICD-10-CM

## 2017-08-06 DIAGNOSIS — Z86718 Personal history of other venous thrombosis and embolism: Secondary | ICD-10-CM

## 2017-08-06 DIAGNOSIS — Z91010 Allergy to peanuts: Secondary | ICD-10-CM

## 2017-08-06 DIAGNOSIS — G4730 Sleep apnea, unspecified: Secondary | ICD-10-CM | POA: Diagnosis present

## 2017-08-06 DIAGNOSIS — Z880 Allergy status to penicillin: Secondary | ICD-10-CM

## 2017-08-06 HISTORY — DX: Paroxysmal atrial fibrillation: I48.0

## 2017-08-06 HISTORY — PX: PROCTOSCOPY: SHX2266

## 2017-08-06 LAB — TYPE AND SCREEN
ABO/RH(D): A NEG
Antibody Screen: NEGATIVE

## 2017-08-06 SURGERY — COLECTOMY, PARTIAL, ROBOT-ASSISTED, LAPAROSCOPIC
Anesthesia: General | Site: Abdomen

## 2017-08-06 MED ORDER — MIDAZOLAM HCL 2 MG/2ML IJ SOLN
INTRAMUSCULAR | Status: AC
  Filled 2017-08-06: qty 2

## 2017-08-06 MED ORDER — DEXAMETHASONE SODIUM PHOSPHATE 10 MG/ML IJ SOLN
INTRAMUSCULAR | Status: DC | PRN
  Administered 2017-08-06: 8 mg via INTRAVENOUS

## 2017-08-06 MED ORDER — BUPIVACAINE-EPINEPHRINE (PF) 0.5% -1:200000 IJ SOLN
INTRAMUSCULAR | Status: AC
  Filled 2017-08-06: qty 30

## 2017-08-06 MED ORDER — BUPIVACAINE-EPINEPHRINE (PF) 0.5% -1:200000 IJ SOLN
INTRAMUSCULAR | Status: DC | PRN
  Administered 2017-08-06: 60 mL

## 2017-08-06 MED ORDER — ONDANSETRON HCL 4 MG/2ML IJ SOLN
INTRAMUSCULAR | Status: AC
  Filled 2017-08-06: qty 2

## 2017-08-06 MED ORDER — BISACODYL 5 MG PO TBEC
20.0000 mg | DELAYED_RELEASE_TABLET | Freq: Once | ORAL | Status: DC
  Filled 2017-08-06: qty 4

## 2017-08-06 MED ORDER — LACTATED RINGERS IV SOLN
INTRAVENOUS | Status: DC
  Administered 2017-08-06 (×2): via INTRAVENOUS

## 2017-08-06 MED ORDER — MEPERIDINE HCL 50 MG/ML IJ SOLN: 6.2500 mg | INTRAMUSCULAR | Status: DC | PRN

## 2017-08-06 MED ORDER — PROMETHAZINE HCL 25 MG/ML IJ SOLN
INTRAMUSCULAR | Status: AC
  Filled 2017-08-06: qty 1

## 2017-08-06 MED ORDER — EPHEDRINE SULFATE-NACL 50-0.9 MG/10ML-% IV SOSY
PREFILLED_SYRINGE | INTRAVENOUS | Status: DC | PRN
  Administered 2017-08-06 (×3): 5 mg via INTRAVENOUS

## 2017-08-06 MED ORDER — HYDROMORPHONE HCL 1 MG/ML IJ SOLN
INTRAMUSCULAR | Status: AC
  Filled 2017-08-06: qty 1

## 2017-08-06 MED ORDER — FENTANYL CITRATE (PF) 250 MCG/5ML IJ SOLN
INTRAMUSCULAR | Status: AC
Start: 2017-08-06 — End: ?
  Filled 2017-08-06: qty 5

## 2017-08-06 MED ORDER — LIDOCAINE 2% (20 MG/ML) 5 ML SYRINGE
INTRAMUSCULAR | Status: DC | PRN
  Administered 2017-08-06: 60 mg via INTRAVENOUS

## 2017-08-06 MED ORDER — LIP MEDEX EX OINT
TOPICAL_OINTMENT | CUTANEOUS | Status: AC
  Filled 2017-08-06: qty 7

## 2017-08-06 MED ORDER — LIDOCAINE HCL 2 % IJ SOLN
INTRAMUSCULAR | Status: AC
  Filled 2017-08-06: qty 20

## 2017-08-06 MED ORDER — BUPIVACAINE LIPOSOME 1.3 % IJ SUSP
INTRAMUSCULAR | Status: DC | PRN
  Administered 2017-08-06: 20 mL

## 2017-08-06 MED ORDER — LIDOCAINE 2% (20 MG/ML) 5 ML SYRINGE
INTRAMUSCULAR | Status: AC
  Filled 2017-08-06: qty 5

## 2017-08-06 MED ORDER — NEOMYCIN SULFATE 500 MG PO TABS: 1000.0000 mg | ORAL_TABLET | ORAL | Status: DC

## 2017-08-06 MED ORDER — FENTANYL CITRATE (PF) 100 MCG/2ML IJ SOLN
INTRAMUSCULAR | Status: DC | PRN
  Administered 2017-08-06: 50 ug via INTRAVENOUS
  Administered 2017-08-06: 100 ug via INTRAVENOUS

## 2017-08-06 MED ORDER — DIPHENHYDRAMINE HCL 50 MG/ML IJ SOLN
12.5000 mg | Freq: Four times a day (QID) | INTRAMUSCULAR | Status: DC | PRN
Start: 2017-08-06 — End: 2017-08-11

## 2017-08-06 MED ORDER — CLINDAMYCIN PHOSPHATE 900 MG/50ML IV SOLN
900.0000 mg | Freq: Three times a day (TID) | INTRAVENOUS | Status: AC
  Administered 2017-08-06: 900 mg via INTRAVENOUS
  Filled 2017-08-06: qty 50

## 2017-08-06 MED ORDER — SACCHAROMYCES BOULARDII 250 MG PO CAPS
250.0000 mg | ORAL_CAPSULE | Freq: Two times a day (BID) | ORAL | Status: DC
  Administered 2017-08-06 – 2017-08-09 (×7): 250 mg via ORAL
  Filled 2017-08-06 (×7): qty 1

## 2017-08-06 MED ORDER — METOCLOPRAMIDE HCL 5 MG/ML IJ SOLN
10.0000 mg | Freq: Four times a day (QID) | INTRAMUSCULAR | Status: DC | PRN
  Administered 2017-08-09 – 2017-08-10 (×3): 10 mg via INTRAVENOUS
  Filled 2017-08-06 (×3): qty 2

## 2017-08-06 MED ORDER — GABAPENTIN 300 MG PO CAPS
300.0000 mg | ORAL_CAPSULE | ORAL | Status: AC
  Administered 2017-08-06: 300 mg via ORAL
  Filled 2017-08-06: qty 1

## 2017-08-06 MED ORDER — ALVIMOPAN 12 MG PO CAPS
12.0000 mg | ORAL_CAPSULE | ORAL | Status: AC
  Administered 2017-08-06: 12 mg via ORAL
  Filled 2017-08-06: qty 1

## 2017-08-06 MED ORDER — ENOXAPARIN SODIUM 40 MG/0.4ML ~~LOC~~ SOLN
40.0000 mg | Freq: Once | SUBCUTANEOUS | Status: AC
  Administered 2017-08-06: 40 mg via SUBCUTANEOUS
  Filled 2017-08-06: qty 0.4

## 2017-08-06 MED ORDER — HYDROCORTISONE 1 % EX CREA
1.0000 "application " | TOPICAL_CREAM | Freq: Three times a day (TID) | CUTANEOUS | Status: DC | PRN
  Filled 2017-08-06: qty 28

## 2017-08-06 MED ORDER — PROPOFOL 10 MG/ML IV BOLUS
INTRAVENOUS | Status: AC
  Filled 2017-08-06: qty 20

## 2017-08-06 MED ORDER — HYDROMORPHONE HCL 1 MG/ML IJ SOLN
0.5000 mg | INTRAMUSCULAR | Status: DC | PRN
  Administered 2017-08-07 – 2017-08-09 (×12): 1 mg via INTRAVENOUS
  Filled 2017-08-06 (×12): qty 1

## 2017-08-06 MED ORDER — ACETAMINOPHEN 500 MG PO TABS
1000.0000 mg | ORAL_TABLET | Freq: Four times a day (QID) | ORAL | Status: DC
  Administered 2017-08-06 – 2017-08-11 (×17): 1000 mg via ORAL
  Filled 2017-08-06 (×19): qty 2

## 2017-08-06 MED ORDER — ACETAMINOPHEN 10 MG/ML IV SOLN
1000.0000 mg | Freq: Once | INTRAVENOUS | Status: DC | PRN
  Administered 2017-08-06: 1000 mg via INTRAVENOUS

## 2017-08-06 MED ORDER — LACTATED RINGERS IV BOLUS: 1000.0000 mL | Freq: Three times a day (TID) | INTRAVENOUS | Status: AC | PRN

## 2017-08-06 MED ORDER — PROCHLORPERAZINE MALEATE 10 MG PO TABS: 10.0000 mg | ORAL_TABLET | Freq: Four times a day (QID) | ORAL | Status: DC | PRN

## 2017-08-06 MED ORDER — LIP MEDEX EX OINT
1.0000 "application " | TOPICAL_OINTMENT | Freq: Two times a day (BID) | CUTANEOUS | Status: DC
  Administered 2017-08-06 – 2017-08-10 (×8): 1 via TOPICAL
  Filled 2017-08-06 (×2): qty 7

## 2017-08-06 MED ORDER — HYDROMORPHONE HCL 1 MG/ML IJ SOLN
0.2500 mg | INTRAMUSCULAR | Status: DC | PRN
  Administered 2017-08-06 (×3): 0.5 mg via INTRAVENOUS

## 2017-08-06 MED ORDER — KETAMINE HCL 10 MG/ML IJ SOLN
INTRAMUSCULAR | Status: AC
  Filled 2017-08-06: qty 1

## 2017-08-06 MED ORDER — GENTAMICIN SULFATE 40 MG/ML IJ SOLN
INTRAMUSCULAR | Status: DC | PRN
  Administered 2017-08-06: 1000 mL

## 2017-08-06 MED ORDER — ENOXAPARIN SODIUM 40 MG/0.4ML ~~LOC~~ SOLN
40.0000 mg | SUBCUTANEOUS | Status: DC
  Administered 2017-08-07 – 2017-08-08 (×2): 40 mg via SUBCUTANEOUS
  Filled 2017-08-06 (×2): qty 0.4

## 2017-08-06 MED ORDER — METHOCARBAMOL 500 MG PO TABS: 1000.0000 mg | ORAL_TABLET | Freq: Four times a day (QID) | ORAL | Status: DC | PRN

## 2017-08-06 MED ORDER — LACTATED RINGERS IV SOLN: INTRAVENOUS | Status: AC

## 2017-08-06 MED ORDER — LIDOCAINE 2% (20 MG/ML) 5 ML SYRINGE
INTRAMUSCULAR | Status: DC | PRN
  Administered 2017-08-06: 1.5 mg/kg/h via INTRAVENOUS

## 2017-08-06 MED ORDER — METOPROLOL TARTRATE 5 MG/5ML IV SOLN: 5.0000 mg | Freq: Four times a day (QID) | INTRAVENOUS | Status: DC | PRN

## 2017-08-06 MED ORDER — ONDANSETRON HCL 4 MG PO TABS
4.0000 mg | ORAL_TABLET | Freq: Four times a day (QID) | ORAL | Status: DC | PRN
  Administered 2017-08-09 – 2017-08-11 (×2): 4 mg via ORAL
  Filled 2017-08-06 (×2): qty 1

## 2017-08-06 MED ORDER — SCOPOLAMINE 1 MG/3DAYS TD PT72
MEDICATED_PATCH | TRANSDERMAL | Status: DC | PRN
  Administered 2017-08-06: 1 via TRANSDERMAL

## 2017-08-06 MED ORDER — ROCURONIUM BROMIDE 10 MG/ML (PF) SYRINGE
PREFILLED_SYRINGE | INTRAVENOUS | Status: AC
  Filled 2017-08-06: qty 5

## 2017-08-06 MED ORDER — SODIUM CHLORIDE 0.9 % IJ SOLN
INTRAMUSCULAR | Status: AC
  Filled 2017-08-06: qty 10

## 2017-08-06 MED ORDER — SCOPOLAMINE 1 MG/3DAYS TD PT72
MEDICATED_PATCH | TRANSDERMAL | Status: AC
  Filled 2017-08-06: qty 1

## 2017-08-06 MED ORDER — PROPOFOL 10 MG/ML IV BOLUS
INTRAVENOUS | Status: DC | PRN
  Administered 2017-08-06: 150 mg via INTRAVENOUS

## 2017-08-06 MED ORDER — ROCURONIUM BROMIDE 10 MG/ML (PF) SYRINGE
PREFILLED_SYRINGE | INTRAVENOUS | Status: DC | PRN
  Administered 2017-08-06: 60 mg via INTRAVENOUS
  Administered 2017-08-06: 20 mg via INTRAVENOUS

## 2017-08-06 MED ORDER — ENSURE SURGERY PO LIQD
237.0000 mL | Freq: Two times a day (BID) | ORAL | Status: DC
  Filled 2017-08-06 (×11): qty 237

## 2017-08-06 MED ORDER — ALUM & MAG HYDROXIDE-SIMETH 200-200-20 MG/5ML PO SUSP: 30.0000 mL | Freq: Four times a day (QID) | ORAL | Status: DC | PRN

## 2017-08-06 MED ORDER — GLYCOPYRROLATE 0.2 MG/ML IV SOSY
PREFILLED_SYRINGE | INTRAVENOUS | Status: DC | PRN
  Administered 2017-08-06: .2 mg via INTRAVENOUS

## 2017-08-06 MED ORDER — PROMETHAZINE HCL 25 MG/ML IJ SOLN: 6.2500 mg | INTRAMUSCULAR | Status: DC | PRN

## 2017-08-06 MED ORDER — SUGAMMADEX SODIUM 200 MG/2ML IV SOLN
INTRAVENOUS | Status: DC | PRN
  Administered 2017-08-06: 500 mg via INTRAVENOUS

## 2017-08-06 MED ORDER — DEXAMETHASONE SODIUM PHOSPHATE 10 MG/ML IJ SOLN
INTRAMUSCULAR | Status: AC
  Filled 2017-08-06: qty 1

## 2017-08-06 MED ORDER — HYDROCORTISONE 2.5 % RE CREA
1.0000 "application " | TOPICAL_CREAM | Freq: Four times a day (QID) | RECTAL | Status: DC | PRN
  Filled 2017-08-06: qty 28.35

## 2017-08-06 MED ORDER — PROCHLORPERAZINE EDISYLATE 10 MG/2ML IJ SOLN
5.0000 mg | Freq: Four times a day (QID) | INTRAMUSCULAR | Status: DC | PRN
  Administered 2017-08-08: 5 mg via INTRAVENOUS
  Filled 2017-08-06: qty 2

## 2017-08-06 MED ORDER — POLYETHYLENE GLYCOL 3350 17 GM/SCOOP PO POWD: 1.0000 | Freq: Once | ORAL | Status: DC

## 2017-08-06 MED ORDER — ACETAMINOPHEN 500 MG PO TABS
1000.0000 mg | ORAL_TABLET | ORAL | Status: AC
  Administered 2017-08-06: 1000 mg via ORAL
  Filled 2017-08-06: qty 2

## 2017-08-06 MED ORDER — ACETAMINOPHEN 10 MG/ML IV SOLN
INTRAVENOUS | Status: AC
  Filled 2017-08-06: qty 100

## 2017-08-06 MED ORDER — SUGAMMADEX SODIUM 500 MG/5ML IV SOLN
INTRAVENOUS | Status: AC
  Filled 2017-08-06: qty 5

## 2017-08-06 MED ORDER — TRAMADOL HCL 50 MG PO TABS: 50.0000 mg | ORAL_TABLET | Freq: Four times a day (QID) | ORAL | 0 refills | Status: DC | PRN

## 2017-08-06 MED ORDER — 0.9 % SODIUM CHLORIDE (POUR BTL) OPTIME
TOPICAL | Status: DC | PRN
  Administered 2017-08-06: 2000 mL

## 2017-08-06 MED ORDER — HYDROCODONE-ACETAMINOPHEN 7.5-325 MG PO TABS: 1.0000 | ORAL_TABLET | Freq: Once | ORAL | Status: DC | PRN

## 2017-08-06 MED ORDER — PHENYLEPHRINE 40 MCG/ML (10ML) SYRINGE FOR IV PUSH (FOR BLOOD PRESSURE SUPPORT)
PREFILLED_SYRINGE | INTRAVENOUS | Status: DC | PRN
  Administered 2017-08-06 (×2): 80 ug via INTRAVENOUS

## 2017-08-06 MED ORDER — ONDANSETRON HCL 4 MG/2ML IJ SOLN
4.0000 mg | Freq: Four times a day (QID) | INTRAMUSCULAR | Status: DC | PRN
  Administered 2017-08-06 – 2017-08-11 (×6): 4 mg via INTRAVENOUS
  Filled 2017-08-06 (×7): qty 2

## 2017-08-06 MED ORDER — MAGIC MOUTHWASH
15.0000 mL | Freq: Four times a day (QID) | ORAL | Status: DC | PRN
  Filled 2017-08-06: qty 15

## 2017-08-06 MED ORDER — METRONIDAZOLE 500 MG PO TABS: 1000.0000 mg | ORAL_TABLET | ORAL | Status: DC

## 2017-08-06 MED ORDER — MENTHOL 3 MG MT LOZG
1.0000 | LOZENGE | OROMUCOSAL | Status: DC | PRN
  Administered 2017-08-06: 3 mg via ORAL
  Filled 2017-08-06: qty 9

## 2017-08-06 MED ORDER — ALVIMOPAN 12 MG PO CAPS
12.0000 mg | ORAL_CAPSULE | Freq: Two times a day (BID) | ORAL | Status: DC
  Administered 2017-08-07 – 2017-08-08 (×4): 12 mg via ORAL
  Filled 2017-08-06 (×4): qty 1

## 2017-08-06 MED ORDER — GABAPENTIN 300 MG PO CAPS
300.0000 mg | ORAL_CAPSULE | Freq: Two times a day (BID) | ORAL | Status: DC
  Administered 2017-08-06 – 2017-08-09 (×7): 300 mg via ORAL
  Filled 2017-08-06 (×7): qty 1

## 2017-08-06 MED ORDER — HYDRALAZINE HCL 20 MG/ML IJ SOLN: 10.0000 mg | INTRAMUSCULAR | Status: DC | PRN

## 2017-08-06 MED ORDER — PHENOL 1.4 % MT LIQD
1.0000 | OROMUCOSAL | Status: DC | PRN
  Filled 2017-08-06: qty 177

## 2017-08-06 MED ORDER — KETAMINE HCL 10 MG/ML IJ SOLN
INTRAMUSCULAR | Status: DC | PRN
  Administered 2017-08-06: 50 mg via INTRAVENOUS

## 2017-08-06 MED ORDER — MIDAZOLAM HCL 5 MG/5ML IJ SOLN
INTRAMUSCULAR | Status: DC | PRN
  Administered 2017-08-06: 2 mg via INTRAVENOUS

## 2017-08-06 MED ORDER — TRAMADOL HCL 50 MG PO TABS: 50.0000 mg | ORAL_TABLET | Freq: Four times a day (QID) | ORAL | Status: DC | PRN

## 2017-08-06 MED ORDER — PHENYLEPHRINE 40 MCG/ML (10ML) SYRINGE FOR IV PUSH (FOR BLOOD PRESSURE SUPPORT)
PREFILLED_SYRINGE | INTRAVENOUS | Status: AC
  Filled 2017-08-06: qty 10

## 2017-08-06 MED ORDER — DIPHENHYDRAMINE HCL 12.5 MG/5ML PO ELIX: 12.5000 mg | ORAL_SOLUTION | Freq: Four times a day (QID) | ORAL | Status: DC | PRN

## 2017-08-06 MED ORDER — METHOCARBAMOL 1000 MG/10ML IJ SOLN
1000.0000 mg | Freq: Four times a day (QID) | INTRAVENOUS | Status: DC | PRN
  Filled 2017-08-06: qty 10

## 2017-08-06 MED ORDER — EPHEDRINE SULFATE 50 MG/ML IJ SOLN
INTRAMUSCULAR | Status: AC
  Filled 2017-08-06: qty 1

## 2017-08-06 MED ORDER — GUAIFENESIN-DM 100-10 MG/5ML PO SYRP: 10.0000 mL | ORAL_SOLUTION | ORAL | Status: DC | PRN

## 2017-08-06 SURGICAL SUPPLY — 97 items
APPLIER CLIP 5 13 M/L LIGAMAX5 (MISCELLANEOUS)
APPLIER CLIP ROT 10 11.4 M/L (STAPLE)
BLADE EXTENDED COATED 6.5IN (ELECTRODE) ×3 IMPLANT
CANNULA REDUC XI 12-8 STAPL (CANNULA) ×1
CANNULA REDUCER 12-8 DVNC XI (CANNULA) ×2 IMPLANT
CHLORAPREP W/TINT 26ML (MISCELLANEOUS) ×3 IMPLANT
CLIP APPLIE 5 13 M/L LIGAMAX5 (MISCELLANEOUS) IMPLANT
CLIP APPLIE ROT 10 11.4 M/L (STAPLE) IMPLANT
CLIP VESOLOCK LG 6/CT PURPLE (CLIP) IMPLANT
CLIP VESOLOCK MED LG 6/CT (CLIP) IMPLANT
COVER SURGICAL LIGHT HANDLE (MISCELLANEOUS) ×6 IMPLANT
COVER TIP SHEARS 8 DVNC (MISCELLANEOUS) ×2 IMPLANT
COVER TIP SHEARS 8MM DA VINCI (MISCELLANEOUS) ×1
DECANTER SPIKE VIAL GLASS SM (MISCELLANEOUS) ×3 IMPLANT
DEVICE TROCAR PUNCTURE CLOSURE (ENDOMECHANICALS) IMPLANT
DRAIN CHANNEL 19F RND (DRAIN) IMPLANT
DRAPE ARM DVNC X/XI (DISPOSABLE) ×8 IMPLANT
DRAPE COLUMN DVNC XI (DISPOSABLE) ×2 IMPLANT
DRAPE DA VINCI XI ARM (DISPOSABLE) ×4
DRAPE DA VINCI XI COLUMN (DISPOSABLE) ×1
DRAPE SURG IRRIG POUCH 19X23 (DRAPES) ×3 IMPLANT
DRSG OPSITE POSTOP 4X10 (GAUZE/BANDAGES/DRESSINGS) IMPLANT
DRSG OPSITE POSTOP 4X6 (GAUZE/BANDAGES/DRESSINGS) IMPLANT
DRSG OPSITE POSTOP 4X8 (GAUZE/BANDAGES/DRESSINGS) IMPLANT
DRSG TEGADERM 2-3/8X2-3/4 SM (GAUZE/BANDAGES/DRESSINGS) IMPLANT
DRSG TEGADERM 4X4.75 (GAUZE/BANDAGES/DRESSINGS) IMPLANT
ELECT PENCIL ROCKER SW 15FT (MISCELLANEOUS) ×3 IMPLANT
ELECT REM PT RETURN 15FT ADLT (MISCELLANEOUS) ×3 IMPLANT
ENDOLOOP SUT PDS II  0 18 (SUTURE)
ENDOLOOP SUT PDS II 0 18 (SUTURE) IMPLANT
EVACUATOR SILICONE 100CC (DRAIN) IMPLANT
GAUZE SPONGE 2X2 8PLY STRL LF (GAUZE/BANDAGES/DRESSINGS) ×2 IMPLANT
GAUZE SPONGE 4X4 12PLY STRL (GAUZE/BANDAGES/DRESSINGS) ×3 IMPLANT
GLOVE ECLIPSE 8.0 STRL XLNG CF (GLOVE) ×15 IMPLANT
GLOVE INDICATOR 8.0 STRL GRN (GLOVE) ×15 IMPLANT
GOWN STRL REUS W/TWL XL LVL3 (GOWN DISPOSABLE) ×15 IMPLANT
GRASPER ENDOPATH ANVIL 10MM (MISCELLANEOUS) IMPLANT
GRASPER SUT TROCAR 14GX15 (MISCELLANEOUS) ×3 IMPLANT
HOLDER FOLEY CATH W/STRAP (MISCELLANEOUS) ×3 IMPLANT
IRRIG SUCT STRYKERFLOW 2 WTIP (MISCELLANEOUS)
IRRIGATION SUCT STRKRFLW 2 WTP (MISCELLANEOUS) IMPLANT
IRRIGATOR SUCT 8 DISP DVNC XI (IRRIGATION / IRRIGATOR) IMPLANT
IRRIGATOR SUCTION 8MM XI DISP (IRRIGATION / IRRIGATOR)
KIT PROCEDURE DA VINCI SI (MISCELLANEOUS) ×1
KIT PROCEDURE DVNC SI (MISCELLANEOUS) ×2 IMPLANT
LUBRICANT JELLY K Y 4OZ (MISCELLANEOUS) ×3 IMPLANT
NEEDLE HYPO 22GX1.5 SAFETY (NEEDLE) ×3 IMPLANT
NEEDLE INSUFFLATION 14GA 120MM (NEEDLE) ×3 IMPLANT
NEEDLE SPNL 22GX3.5 QUINCKE BK (NEEDLE) ×3 IMPLANT
PACK CARDIOVASCULAR III (CUSTOM PROCEDURE TRAY) ×3 IMPLANT
PACK COLON (CUSTOM PROCEDURE TRAY) ×3 IMPLANT
PAD POSITIONING PINK XL (MISCELLANEOUS) ×3 IMPLANT
PORT LAP GEL ALEXIS MED 5-9CM (MISCELLANEOUS) ×3 IMPLANT
SCISSORS LAP 5X35 DISP (ENDOMECHANICALS) ×3 IMPLANT
SEAL CANN UNIV 5-8 DVNC XI (MISCELLANEOUS) ×6 IMPLANT
SEAL XI 5MM-8MM UNIVERSAL (MISCELLANEOUS) ×3
SEALER VESSEL DA VINCI XI (MISCELLANEOUS) ×1
SEALER VESSEL EXT DVNC XI (MISCELLANEOUS) ×2 IMPLANT
SLEEVE ADV FIXATION 5X100MM (TROCAR) ×3 IMPLANT
SOLUTION ELECTROLUBE (MISCELLANEOUS) ×3 IMPLANT
SPONGE GAUZE 2X2 STER 10/PKG (GAUZE/BANDAGES/DRESSINGS) ×1
STAPLER 45 BLU RELOAD XI (STAPLE) IMPLANT
STAPLER 45 BLUE RELOAD XI (STAPLE)
STAPLER 45 GREEN RELOAD XI (STAPLE) ×2
STAPLER 45 GRN RELOAD XI (STAPLE) ×4 IMPLANT
STAPLER CANNULA SEAL DVNC XI (STAPLE) ×2 IMPLANT
STAPLER CANNULA SEAL XI (STAPLE) ×1
STAPLER SHEATH (SHEATH) ×1
STAPLER SHEATH ENDOWRIST DVNC (SHEATH) ×2 IMPLANT
SUT MNCRL AB 4-0 PS2 18 (SUTURE) ×3 IMPLANT
SUT PDS AB 1 CT1 27 (SUTURE) ×6 IMPLANT
SUT PDS AB 1 CTX 36 (SUTURE) IMPLANT
SUT PDS AB 1 TP1 96 (SUTURE) IMPLANT
SUT PDS AB 2-0 CT2 27 (SUTURE) IMPLANT
SUT PROLENE 0 CT 2 (SUTURE) ×3 IMPLANT
SUT PROLENE 2 0 KS (SUTURE) ×3 IMPLANT
SUT PROLENE 2 0 SH DA (SUTURE) IMPLANT
SUT SILK 2 0 (SUTURE) ×1
SUT SILK 2 0 SH CR/8 (SUTURE) ×3 IMPLANT
SUT SILK 2-0 18XBRD TIE 12 (SUTURE) ×2 IMPLANT
SUT SILK 3 0 (SUTURE) ×1
SUT SILK 3 0 SH CR/8 (SUTURE) ×3 IMPLANT
SUT SILK 3-0 18XBRD TIE 12 (SUTURE) ×2 IMPLANT
SUT V-LOC BARB 180 2/0GR6 GS22 (SUTURE)
SUT VIC AB 3-0 SH 18 (SUTURE) ×3 IMPLANT
SUT VIC AB 3-0 SH 27 (SUTURE) ×1
SUT VIC AB 3-0 SH 27XBRD (SUTURE) ×2 IMPLANT
SUT VICRYL 0 UR6 27IN ABS (SUTURE) ×3 IMPLANT
SUTURE V-LC BRB 180 2/0GR6GS22 (SUTURE) IMPLANT
SYR 10ML LL (SYRINGE) ×3 IMPLANT
TAPE CLOTH SURG 4X10 WHT LF (GAUZE/BANDAGES/DRESSINGS) ×3 IMPLANT
TAPE UMBILICAL COTTON 1/8X30 (MISCELLANEOUS) ×3 IMPLANT
TOWEL OR NON WOVEN STRL DISP B (DISPOSABLE) ×3 IMPLANT
TRAY FOLEY CATH SILVER 16FR LF (SET/KITS/TRAYS/PACK) ×3 IMPLANT
TROCAR ADV FIXATION 5X100MM (TROCAR) ×3 IMPLANT
TUBING CONNECTING 10 (TUBING) ×6 IMPLANT
TUBING INSUFFLATION 10FT LAP (TUBING) ×3 IMPLANT

## 2017-08-06 NOTE — Discharge Instructions (Signed)
PLEASE GIVE LOVENOX SHOTS IN THIGH (NOT ABDOMEN)   SURGERY: POST OP INSTRUCTIONS (Surgery for small bowel obstruction, colon resection, etc)   ######################################################################  EAT Gradually transition to a high fiber diet with a fiber supplement over the next few days after discharge  WALK Walk an hour a day.  Control your pain to do that.    CONTROL PAIN Control pain so that you can walk, sleep, tolerate sneezing/coughing, go up/down stairs.  HAVE A BOWEL MOVEMENT DAILY Keep your bowels regular to avoid problems.  OK to try a laxative to override constipation.  OK to use an antidairrheal to slow down diarrhea.  Call if not better after 2 tries  CALL IF YOU HAVE PROBLEMS/CONCERNS Call if you are still struggling despite following these instructions. Call if you have concerns not answered by these instructions  ######################################################################   DIET Follow a light diet the first few days at home.  Start with a bland diet such as soups, liquids, starchy foods, low fat foods, etc.  If you feel full, bloated, or constipated, stay on a ful liquid or pureed/blenderized diet for a few days until you feel better and no longer constipated. Be sure to drink plenty of fluids every day to avoid getting dehydrated (feeling dizzy, not urinating, etc.). Gradually add a fiber supplement to your diet over the next week.  Gradually get back to a regular solid diet.  Avoid fast food or heavy meals the first week as you are more likely to get nauseated. It is expected for your digestive tract to need a few months to get back to normal.  It is common for your bowel movements and stools to be irregular.  You will have occasional bloating and cramping that should eventually fade away.  Until you are eating solid food normally, off all pain medications, and back to regular activities; your bowels will not be normal. Focus on  eating a low-fat, high fiber diet the rest of your life (See Getting to [place], below).  CARE of your INCISION or WOUND It is good for closed incision and even open wounds to be washed every day.  Shower every day.  Short baths are fine.  Wash the incisions and wounds clean with soap & water.    If you have a closed incision(s), wash the incision with soap & water every day.  You may leave closed incisions open to air if it is dry.   You may cover the incision with clean gauze & replace it after your daily shower for comfort. If you have skin tapes (Steristrips) or skin glue (Dermabond) on your incision, leave them in place.  They will fall off on their own like a scab.  You may trim any edges that curl up with clean scissors.  If you have staples, set up an appointment for them to be removed in the office in 10 days after surgery.  If you have a drain, wash around the skin exit site with soap & water and place a new dressing of gauze or band aid around the skin every day.  Keep the drain site clean & dry.    If you have an open wound with packing, see wound care instructions.  In general, it is encouraged that you remove your dressing and packing, shower with soap & water, and replace your dressing once a day.  Pack the wound with clean gauze moistened with normal (0.9%) saline to keep the wound moist & uninfected.  Pressure  on the dressing for 30 minutes will stop most wound bleeding.  Eventually your body will heal & pull the open wound closed over the next few months.  Raw open wounds will occasionally bleed or secrete yellow drainage until it heals closed.  Drain sites will drain a little until the drain is removed.  Even closed incisions can have mild bleeding or drainage the first few days until the skin edges scab over & seal.   If you have an open wound with a wound vac, see wound vac care instructions.     ACTIVITIES as tolerated Start light daily activities --- self-care,  walking, climbing stairs-- beginning the day after surgery.  Gradually increase activities as tolerated.  Control your pain to be active.  Stop when you are tired.  Ideally, walk several times a day, eventually an hour a day.   Most people are back to most day-to-day activities in a few weeks.  It takes 4-8 weeks to get back to unrestricted, intense activity. If you can walk 30 minutes without difficulty, it is safe to try more intense activity such as jogging, treadmill, bicycling, low-impact aerobics, swimming, etc. Save the most intensive and strenuous activity for last (Usually 4-8 weeks after surgery) such as sit-ups, heavy lifting, contact sports, etc.  Refrain from any intense heavy lifting or straining until you are off narcotics for pain control.  You will have off days, but things should improve week-by-week. DO NOT PUSH THROUGH PAIN.  Let pain be your guide: If it hurts to do something, don't do it.  Pain is your body warning you to avoid that activity for another week until the pain goes down. You may drive when you are no longer taking narcotic prescription pain medication, you can comfortably wear a seatbelt, and you can safely make sudden turns/stops to protect yourself without hesitating due to pain. You may have sexual intercourse when it is comfortable. If it hurts to do something, stop.  MEDICATIONS Take your usually prescribed home medications unless otherwise directed.   Blood thinners:  Usually you can restart any strong blood thinners after the second postoperative day.  It is OK to take aspirin right away.     If you are on strong blood thinners (warfarin/Coumadin, Plavix, Xerelto, Eliquis, Pradaxa, etc), discuss with your surgeon, medicine PCP, and/or cardiologist for instructions on when to restart the blood thinner & if blood monitoring is needed (PT/INR blood check, etc).     PAIN CONTROL Pain after surgery or related to activity is often due to strain/injury to muscle,  tendon, nerves and/or incisions.  This pain is usually short-term and will improve in a few months.  To help speed the process of healing and to get back to regular activity more quickly, DO THE FOLLOWING THINGS TOGETHER: 1. Increase activity gradually.  DO NOT PUSH THROUGH PAIN 2. Use Ice and/or Heat 3. Try Gentle Massage and/or Stretching 4. Take over the counter pain medication 5. Take Narcotic prescription pain medication for more severe pain  Good pain control = faster recovery.  It is better to take more medicine to be more active than to stay in bed all day to avoid medications. 1.  Increase activity gradually Avoid heavy lifting at first, then increase to lifting as tolerated over the next 6 weeks. Do not push through the pain.  Listen to your body and avoid positions and maneuvers than reproduce the pain.  Wait a few days before trying something more intense Walking an hour  a day is encouraged to help your body recover faster and more safely.  Start slowly and stop when getting sore.  If you can walk 30 minutes without stopping or pain, you can try more intense activity (running, jogging, aerobics, cycling, swimming, treadmill, sex, sports, weightlifting, etc.) Remember: If it hurts to do it, then dont do it! 2. Use Ice and/or Heat You will have swelling and bruising around the incisions.  This will take several weeks to resolve. Ice packs or heating pads (6-8 times a day, 30-60 minutes at a time) will help sooth soreness & bruising. Some people prefer to use ice alone, heat alone, or alternate between ice & heat.  Experiment and see what works best for you.  Consider trying ice for the first few days to help decrease swelling and bruising; then, switch to heat to help relax sore spots and speed recovery. Shower every day.  Short baths are fine.  It feels good!  Keep the incisions and wounds clean with soap & water.   3. Try Gentle Massage and/or Stretching Massage at the area of pain  many times a day Stop if you feel pain - do not overdo it 4. Take over the counter pain medication This helps the muscle and nerve tissues become less irritable and calm down faster Choose ONE of the following over-the-counter anti-inflammatory medications: Acetaminophen 500mg  tabs (Tylenol) 1-2 pills with every meal and just before bedtime (avoid if you have liver problems or if you have acetaminophen in you narcotic prescription) Naproxen 220mg  tabs (ex. Aleve, Naprosyn) 1-2 pills twice a day (avoid if you have kidney, stomach, IBD, or bleeding problems) Ibuprofen 200mg  tabs (ex. Advil, Motrin) 3-4 pills with every meal and just before bedtime (avoid if you have kidney, stomach, IBD, or bleeding problems) Take with food/snack several times a day as directed for at least 2 weeks to help keep pain / soreness down & more manageable. 5. Take Narcotic prescription pain medication for more severe pain A prescription for strong pain control is often given to you upon discharge (for example: oxycodone/Percocet, hydrocodone/Norco/Vicodin, or tramadol/Ultram) Take your pain medication as prescribed. Be mindful that most narcotic prescriptions contain Tylenol (acetaminophen) as well - avoid taking too much Tylenol. If you are having problems/concerns with the prescription medicine (does not control pain, nausea, vomiting, rash, itching, etc.), please call us [phone] to see if we need to switch you to a different pain medicine that will work better for you and/or control your side effects better. If you need a refill on your pain medication, you must call the office before 4 pm and on weekdays only.  By federal law, prescriptions for narcotics cannot be called into a pharmacy.  They must be filled out on paper & picked up from our office by the patient or authorized caretaker.  Prescriptions cannot be filled after 4 pm nor on weekends.    WHEN TO CALL us [phone] Severe uncontrolled or worsening  pain  Fever over 101 F (38.5 C) Concerns with the incision: Worsening pain, redness, rash/hives, swelling, bleeding, or drainage Reactions / problems with new medications (itching, rash, hives, nausea, etc.) Nausea and/or vomiting Difficulty urinating Difficulty breathing Worsening fatigue, dizziness, lightheadedness, blurred vision Other concerns If you are not getting better after two weeks or are noticing you are getting worse, contact our office [phone] for further advice.  We may need to adjust your medications, re-evaluate you in the office, send you to the emergency room, or  see what other things we can do to help. The clinic staff is available to answer your questions during regular business hours (8:30am-5pm).  Please dont hesitate to call and ask to speak to one of our nurses for clinical concerns.    A surgeon from [facility] is always on call at the hospitals 24 hours/day If you have a medical emergency, go to the nearest emergency room or call 911.  FOLLOW UP in our office One the day of your discharge from the hospital (or the next business weekday), please call [facility] to set up or confirm an appointment to see your surgeon in the office for a follow-up appointment.  Usually it is 2-3 weeks after your surgery.   If you have skin staples at your incision(s), let the office know so we can set up a time in the office for the nurse to remove them (usually around 10 days after surgery). Make sure that you call for appointments the day of discharge (or the next business weekday) from the hospital to ensure a convenient appointment time. IF YOU HAVE DISABILITY OR FAMILY LEAVE FORMS, BRING THEM TO THE OFFICE FOR PROCESSING.  DO NOT GIVE THEM TO YOUR DOCTOR.  [facility] [address] [place], [place], [place]  [address] ? [phone] - Main [phone] - [place],  [phone] -  Fax [email or web address]  GETTING TO GOOD BOWEL HEALTH. It is expected for your digestive tract to need a few months to get back to normal.  It is common for your bowel movements and stools to be irregular.  You will have occasional bloating and cramping that should eventually fade away.  Until you are eating solid food normally, off all pain medications, and back to regular activities; your bowels will not be normal.   Avoiding constipation The goal: ONE SOFT BOWEL MOVEMENT A DAY!    Drink plenty of fluids.  Choose water first. TAKE A FIBER SUPPLEMENT EVERY DAY THE REST OF YOUR LIFE During your first week back home, gradually add back a fiber supplement every day Experiment which form you can tolerate.   There are many forms such as powders, tablets, wafers, gummies, etc Psyllium bran (Metamucil), methylcellulose (Citrucel), Miralax or Glycolax, Benefiber, Flax Seed.  Adjust the dose week-by-week (1/2 dose/day to 6 doses a day) until you are moving your bowels 1-2 times a day.  Cut back the dose or try a different fiber product if it is giving you problems such as diarrhea or bloating. Sometimes a laxative is needed to help jump-start bowels if constipated until the fiber supplement can help regulate your bowels.  If you are tolerating eating & you are farting, it is okay to try a gentle laxative such as double dose MiraLax, prune juice, or Milk of Magnesia.  Avoid using laxatives too often. Stool softeners can sometimes help counteract the constipating effects of narcotic pain medicines.  It can also cause diarrhea, so avoid using for too long. If you are still constipated despite taking fiber daily, eating solids, and a few doses of laxatives, call our office. Controlling diarrhea Try drinking liquids and eating bland foods for a few days to avoid stressing your intestines further. Avoid dairy products (especially milk & ice cream) for a short time.  The intestines often can lose the  ability to digest lactose when stressed. Avoid foods that cause gassiness or bloating.  Typical foods include beans and other legumes, cabbage, broccoli, and dairy foods.  Avoid greasy, spicy, fast foods.  Every person has some sensitivity to other foods, so listen to your body and avoid those foods that trigger problems for you. Probiotics (such as active yogurt, Align, etc) may help repopulate the intestines and colon with normal bacteria and calm down a sensitive digestive tract Adding a fiber supplement gradually can help thicken stools by absorbing excess fluid and retrain the intestines to act more normally.  Slowly increase the dose over a few weeks.  Too much fiber too soon can backfire and cause cramping & bloating. It is okay to try and slow down diarrhea with a few doses of antidiarrheal medicines.   Bismuth subsalicylate (ex. Kayopectate, Pepto Bismol) for a few doses can help control diarrhea.  Avoid if pregnant.   Loperamide (Imodium) can slow down diarrhea.  Start with one tablet (2mg ) first.  Avoid if you are having fevers or severe pain.  ILEOSTOMY PATIENTS WILL HAVE CHRONIC DIARRHEA since their colon is not in use.    Drink plenty of liquids.  You will need to drink even more glasses of water/liquid a day to avoid getting dehydrated. Record output from your ileostomy.  Expect to empty the bag every 3-4 hours at first.  Most people with a permanent ileostomy empty their bag 4-6 times at the least.   Use antidiarrheal medicine (especially Imodium) several times a day to avoid getting dehydrated.  Start with a dose at bedtime & breakfast.  Adjust up or down as needed.  Increase antidiarrheal medications as directed to avoid emptying the bag more than 8 times a day (every 3 hours). Work with your wound ostomy nurse to learn care for your ostomy.  See ostomy care instructions. TROUBLESHOOTING IRREGULAR BOWELS 1) Start with a soft & bland diet. No spicy, greasy, or fried foods.  2) Avoid  gluten/wheat or dairy products from diet to see if symptoms improve. 3) Miralax 17gm or flax seed mixed in [place]. water or juice-daily. May use 2-4 times a day as needed. 4) Gas-X, Phazyme, etc. as needed for gas & bloating.  5) Prilosec (omeprazole) over-the-counter as needed 6)  Consider probiotics (Align, Activa, etc) to help calm the bowels down  Call your doctor if you are getting worse or not getting better.  Sometimes further testing (cultures, endoscopy, X-ray studies, CT scans, bloodwork, etc.) may be needed to help diagnose and treat the cause of the diarrhea. [facility], [place], [place], [place], [place]  [address] [phone] - Main.    [phone]  - Toll Free.   [phone] - Fax [email or web address]   Diverticulitis Diverticulitis is infection or inflammation of small pouches (diverticula) in the colon that form due to a condition called diverticulosis. Diverticula can trap stool (feces) and bacteria, causing infection and inflammation. Diverticulitis may cause severe stomach pain and diarrhea. It may lead to tissue damage in the colon that causes bleeding. The diverticula may also burst (rupture) and cause infected stool to enter other areas of the abdomen. Complications of diverticulitis can include:  Bleeding.  Severe infection.  Severe pain.  Rupture (perforation) of the colon.  Blockage (obstruction) of the colon.  What are the causes? This condition is caused by stool becoming trapped in the diverticula, which allows bacteria to grow in the diverticula. This leads to inflammation and infection. What increases the risk? You are more likely to develop this condition if:  You have diverticulosis. The risk for diverticulosis increases if: ? You are overweight or  obese. ? You use tobacco products. ? You do not get enough exercise.  You eat a diet that does not include enough fiber. High-fiber foods include fruits,  vegetables, beans, nuts, and whole grains.  What are the signs or symptoms? Symptoms of this condition may include:  Pain and tenderness in the abdomen. The pain is normally located on the left side of the abdomen, but it may occur in other areas.  Fever and chills.  Bloating.  Cramping.  Nausea.  Vomiting.  Changes in bowel routines.  Blood in your stool.  How is this diagnosed? This condition is diagnosed based on:  Your medical history.  A physical exam.  Tests to make sure there is nothing else causing your condition. These tests may include: ? Blood tests. ? Urine tests. ? Imaging tests of the abdomen, including X-rays, ultrasounds, MRIs, or CT scans.  How is this treated? Most cases of this condition are mild and can be treated at home. Treatment may include:  Taking over-the-counter pain medicines.  Following a clear liquid diet.  Taking antibiotic medicines by mouth.  Rest.  More severe cases may need to be treated at a hospital. Treatment may include:  Not eating or drinking.  Taking prescription pain medicine.  Receiving antibiotic medicines through an IV tube.  Receiving fluids and nutrition through an IV tube.  Surgery.  When your condition is under control, your health care provider may recommend that you have a colonoscopy. This is an exam to look at the entire large intestine. During the exam, a lubricated, bendable tube is inserted into the anus and then passed into the rectum, colon, and other parts of the large intestine. A colonoscopy can show how severe your diverticula are and whether something else may be causing your symptoms. Follow these instructions at home: Medicines  Take over-the-counter and prescription medicines only as told by your health care provider. These include fiber supplements, probiotics, and stool softeners.  If you were prescribed an antibiotic medicine, take it as told by your health care provider. Do not stop  taking the antibiotic even if you start to feel better.  Do not drive or use heavy machinery while taking prescription pain medicine. General instructions  Follow a full liquid diet or another diet as directed by your health care provider. After your symptoms improve, your health care provider may tell you to change your diet. He or she may recommend that you eat a diet that contains at least 25 g (25 grams) of fiber daily. Fiber makes it easier to pass stool. Healthy sources of fiber include: ? Berries. One cup contains 4-8 grams of fiber. ? Beans or lentils. One half cup contains 5-8 grams of fiber. ? Green vegetables. One cup contains 4 grams of fiber.  Exercise for at least 30 minutes, 3 times each week. You should exercise hard enough to raise your heart rate and break a sweat.  Keep all follow-up visits as told by your health care provider. This is important. You may need a colonoscopy. Contact a health care provider if:  Your pain does not improve.  You have a hard time drinking or eating food.  Your bowel movements do not return to normal. Get help right away if:  Your pain gets worse.  Your symptoms do not get better with treatment.  Your symptoms suddenly get worse.  You have a fever.  You vomit more than one time.  You have stools that are bloody, black, or tarry. Summary  Diverticulitis is infection or inflammation of small pouches (diverticula) in the colon that form due to a condition called diverticulosis. Diverticula can trap stool (feces) and bacteria, causing infection and inflammation.  You are at higher risk for this condition if you have diverticulosis and you eat a diet that does not include enough fiber.  Most cases of this condition are mild and can be treated at home. More severe cases may need to be treated at a hospital.  When your condition is under control, your health care provider may recommend that you have an exam called a colonoscopy. This  exam can show how severe your diverticula are and whether something else may be causing your symptoms. This information is not intended to replace advice given to you by your health care provider. Make sure you discuss any questions you have with your health care provider. Document Released: 12/17/2004 Document Revised: 04/11/2016 Document Reviewed: 04/11/2016 Elsevier Interactive Patient Education  [employer].

## 2017-08-06 NOTE — Addendum Note (Signed)
Addendum  created 08/06/17 1407 by Mitzie Na, CRNA   Intraprocedure Flowsheets edited

## 2017-08-06 NOTE — Transfer of Care (Signed)
Immediate Anesthesia Transfer of Care Note  Patient: [name]  Procedure(s) Performed: XI ROBOT DISTAL SIGMOID COLECTOMY ERAS PATHWAY (N/A Abdomen) PROCTOSCOPY (N/A )  Patient Location: PACU  Anesthesia Type:General  Level of Consciousness: drowsy and patient cooperative  Airway & Oxygen Therapy: Patient Spontanous Breathing and Patient connected to face mask oxygen  Post-op Assessment: Report given to RN, Post -op Vital signs reviewed and stable and Patient moving all extremities  Post vital signs: Reviewed and stable  Last Vitals:  Vitals Value Taken Time  BP 144/89 08/06/2017 10:18 AM  Temp    Pulse 102 08/06/2017 10:20 AM  Resp 12 08/06/2017 10:20 AM  SpO2 93 % 08/06/2017 10:20 AM  Vitals shown include unvalidated device data.  Last Pain:  Vitals:   08/06/17 1194  TempSrc:   PainSc: 0-No pain         Complications: No apparent anesthesia complications

## 2017-08-06 NOTE — Interval H&P Note (Signed)
History and Physical Interval Note:  08/06/2017 7:01 AM  [name]  has presented today for surgery, with the diagnosis of recurrent sigmoid diverticulitis  The various methods of treatment have been discussed with the patient and family. After consideration of risks, benefits and other options for treatment, the patient has consented to  Procedure(s): XI [place] (N/A) as a surgical intervention .  The patient's history has been reviewed, patient examined, no change in status, stable for surgery.  I have reviewed the patient's chart and labs.  Questions were answered to the patient's satisfaction.    I have re-reviewed the the patient's records, history, medications, and allergies.  I have re-examined the patient.  I again discussed intraoperative plans and goals of post-operative recovery.  The patient agrees to proceed.  [place]  8[birth year]4 [id number]  Patient Care Team: Antony Contras, MD as PCP - General (Family Medicine) Thompson Grayer, MD as PCP - Cardiology (Cardiology) Michael Boston, MD as Consulting Physician (General Surgery) Armbruster, Carlota Raspberry, MD as Consulting Physician (Gastroenterology) Serena Colonel, RN as [facility]  Patient Active Problem List   Diagnosis Date Noted  . Chronic anticoagulation (warfarin for Afib) 08/06/2017  . Gastric polyp   . Fall 07/08/2017  . Scalp laceration 07/08/2017  . Hypokalemia 07/08/2017  . Diverticulitis of sigmoid colon 11/24/2016  . A-fib ([place]) 05/07/2016  . Obesity 01/10/2014  . Sleep apnea 01/10/2014  . Encounter for therapeutic drug monitoring 04/26/2013  . Essential hypertension 03/01/2013  . History of DVT (deep vein thrombosis) 01/12/2013  . Hyperglycemia 01/12/2013  . Chest pain 06/15/2012  . Shortness of breath 06/15/2012  . Dizziness 07/09/2010  . HYPERCHOLESTEROLEMIA 09/10/2009  . CARPAL TUNNEL SYNDROME 09/10/2009  . Asthma 09/10/2009   . TMJ SYNDROME 09/10/2009  . GERD 09/10/2009  . GALLBLADDER DISEASE 09/10/2009  . Paroxysmal atrial fibrillation ([place]) 09/09/2009    Past Medical History:  Diagnosis Date  . Allergic rhinitis   . Arthritis    "back" (05/07/2016)  . Asthma   . Atrial flutter ([place])    a. Remotely per notes.  . Carpal tunnel syndrome, bilateral   . Colon polyps   . Diverticulitis   . DVT (deep venous thrombosis) ([place]) 1980s x2 -    between birth of two sons. Saw hematology - was told she has a hypercoagulable disorder, should be on Coumadin lifelong.  Marland Kitchen Dysrhythmia    atrial fib  . Family history of adverse reaction to anesthesia    "mother gets PONV"  . Gallbladder disease   . GERD (gastroesophageal reflux disease)   . Halothane adverse reaction    narrow small opening  . Heart murmur   . Hiatal hernia   . History of hiatal hernia    "small one/CTA 05/01/2016" (05/07/2016)  . HTN (hypertension)   . Hypercholesteremia    hx; "brought it down w/diet" (05/07/2016)  . Hyperglycemia    a. A1c 6.1 in 2014.  . Inguinal hernia   . Left sided sciatica   . Normal cardiac stress test 06/2012  . Obesity   . OSA (obstructive sleep apnea)    a. Mild, did not tolerate CPAP (05/07/2016)  . PAF (paroxysmal atrial fibrillation) ([place])    a. s/p afib ablation at [facility] 2010 (Dr. Annabell Howells). b. Recurrent AF s/p DCCV 06/2010. c. On flecainide.   Marland Kitchen PONV (postoperative nausea and vomiting)   . PPD positive, treated 1977   "treated for 1 yr  after exposure to patient"  . Pre-diabetes   . RSV infection ~ 2006  . Wandering (atrial) pacemaker    a. Remotely per notes.  (  pt. states has a wandering p wave ) no pacemaker    Past Surgical History:  Procedure Laterality Date  . ATRIAL ABLATION SURGERY  05/07/2016   "fib and flutter"  . ATRIAL FIBRILLATION ABLATION  2010   at [facility]  . ATRIAL FIBRILLATION ABLATION N/A 05/07/2016   Procedure: Atrial Fibrillation Ablation;  Surgeon: Thompson Grayer, MD;   Location: [facility];  Service: Cardiovascular;  Laterality: N/A;  . BREAST CYST ASPIRATION Bilateral   . CARDIAC CATHETERIZATION  2008  . CARPAL TUNNEL RELEASE Bilateral   . CESAREAN SECTION  1986; 1988  . COLONOSCOPY  X 2  . COLONOSCOPY W/ BIOPSIES AND POLYPECTOMY  X 2  . ESOPHAGOGASTRODUODENOSCOPY    . ESOPHAGOGASTRODUODENOSCOPY (EGD) WITH PROPOFOL N/A 07/19/2017   Procedure: ESOPHAGOGASTRODUODENOSCOPY (EGD) WITH PROPOFOL;  Surgeon: Yetta Flock, MD;  Location: [facility];  Service: Gastroenterology;  Laterality: N/A;  . INGUINAL HERNIA REPAIR Right   . LAPAROSCOPIC CHOLECYSTECTOMY  1989  . POLYPECTOMY N/A 07/19/2017   Procedure: POLYPECTOMY;  Surgeon: Yetta Flock, MD;  Location: [facility];  Service: Gastroenterology;  Laterality: N/A;  . TMJ ARTHROPLASTY    . TUBAL LIGATION  1988    Social History   Socioeconomic History  . Marital status: Married    Spouse name: Not on file  . Number of children: 2  . Years of education: Not on file  . Highest education level: Not on file  Occupational History  . Occupation: Programmer, multimedia: [employer]  . Financial resource strain: Not on file  . Food insecurity:    Worry: Not on file    Inability: Not on file  . Transportation needs:    Medical: Not on file    Non-medical: Not on file  Tobacco Use  . Smoking status: Never Smoker  . Smokeless tobacco: Never Used  Substance and Sexual Activity  . Alcohol use: Not Currently    Comment: 07-13-17  . Drug use: No  . Sexual activity: Not Currently  Lifestyle  . Physical activity:    Days per week: Not on file    Minutes per session: Not on file  . Stress: Not on file  Relationships  . Social connections:    Talks on phone: Not on file    Gets together: Not on file    Attends religious service: Not on file    Active member of club or organization: Not on file    Attends meetings of clubs or organizations: Not on file     Relationship status: Not on file  . Intimate partner violence:    Fear of current or ex partner: Not on file    Emotionally abused: Not on file    Physically abused: Not on file    Forced sexual activity: Not on file  Other Topics Concern  . Not on file  Social History Narrative  . Not on file    Family History  Problem Relation Age of Onset  . Stroke Mother   . Heart attack Mother        2 previous MIs, 3 stents - CAD first diagnosed 56  . Thyroid disease Mother   . Colon polyps Father   . Lupus Sister   . Heart attack Paternal Grandfather  Died at 21 of massive MI  . Stroke Maternal Grandfather        Multiple family members  . Stroke Paternal Grandmother   . Thyroid disease Sister        x 2  . Colon cancer Neg Hx   . Stomach cancer Neg Hx     Medications Prior to Admission  Medication Sig Dispense Refill Last Dose  . acetaminophen (TYLENOL) 650 MG CR tablet Take 1,300 mg by mouth 2 (two) times daily.   08/05/2017 at 1600  . cetirizine (ZYRTEC) 10 MG tablet Take 10 mg by mouth daily.     08/06/2017 at 0400  . Cholecalciferol (VITAMIN D3) 1000 UNITS CAPS Take 2,000 Units by mouth daily.    08/05/2017 at 0800  . dicyclomine (BENTYL) 10 MG capsule Take 1 tablet by mouth 30 min before meals and at bedtime as needed for pain, cramping. 120 capsule 1 08/04/2017 at 2000  . enoxaparin (LOVENOX) 100 MG/ML injection Inject 1 mL (100 mg total) into the skin every 12 (twelve) hours. 10 Syringe 0 08/05/2017 at 0800  . FLOVENT HFA 220 MCG/ACT inhaler Inhale 1 puff into the lungs 2 (two) times daily.  1 08/06/2017 at 0400  . fluticasone (FLONASE) 50 MCG/ACT nasal spray Place 1 spray into both nostrils 2 (two) times daily.    08/06/2017 at 0400  . losartan (COZAAR) 25 MG tablet Take 1 tablet (25 mg total) by mouth daily. 90 tablet 0 08/05/2017 at 0800  . Multiple Vitamin (MULTIVITAMIN WITH MINERALS) TABS tablet Take 1 tablet by mouth daily.   08/05/2017 at 0800  . omeprazole (PRILOSEC) 40  MG capsule Take 40 mg by mouth 2 (two) times daily.   08/06/2017 at 0400  . polyethylene glycol (MIRALAX) packet Take 17 g by mouth every evening.    08/05/2017 at Unknown time  . sucralfate (CARAFATE) 1 GM/10ML suspension Take 10 mLs (1 g total) by mouth every 6 (six) hours as needed. (Patient taking differently: Take 1 g by mouth every 6 (six) hours as needed (for GERD symptoms). ) 420 mL 1 Not yet at Not yet  . venlafaxine XR (EFFEXOR-XR) 37.5 MG 24 hr capsule Take 37.5 mg by mouth daily with breakfast.    08/06/2017 at 0400  . verapamil (CALAN) 80 MG tablet Take 160 mg by mouth 2 (two) times daily.   08/05/2017 at 2200  . PROAIR HFA 108 (90 BASE) MCG/ACT inhaler Inhale 2 puffs into the lungs every 6 (six) hours as needed for wheezing or shortness of breath.    More than a month at Unknown time  . warfarin (COUMADIN) 2 MG tablet TAKE AS DIRECTED BY COUMADIN CLINIC (Patient taking differently: Take 4-6 mg by mouth See admin instructions. Take 6 mg by mouth in the morning on Sun/Tues/Thurs/Sat and 4 mg on Mon/Wed/Fri) 225 tablet 1 07/31/2017    Current Facility-Administered Medications  Medication Dose Route Frequency Provider Last Rate Last Dose  . bisacodyl (DULCOLAX) EC tablet 20 mg  20 mg Oral Once Michael Boston, MD      . bupivacaine liposome (EXPAREL) 1.3 % injection 266 mg  20 mL Infiltration On Call to OR Michael Boston, MD      . clindamycin (CLEOCIN) 900 mg, gentamicin (GARAMYCIN) 240 mg in sodium chloride 0.9 % 1,000 mL for intraperitoneal lavage   Intraperitoneal To OR Michael Boston, MD      . clindamycin (CLEOCIN) IVPB 900 mg  900 mg Intravenous 60 min Pre-Op Michael Boston, MD  And  . gentamicin (GARAMYCIN) 380 mg in dextrose 5 % 100 mL IVPB  5 mg/kg (Adjusted) Intravenous 60 min Pre-Op Michael Boston, MD      . lactated ringers infusion   Intravenous Continuous Lyn Hollingshead, MD         Allergies  Allergen Reactions  . Aspirin Other (See Comments)    Makes asthma worse and  makes stomach hurt  . Ivp Dye [Iodinated Diagnostic Agents] Anaphylaxis    Required epinephrine. Since then, has tolerated with premed.  . Latex Other (See Comments) and Cough    Wheezing/cough   . Meperidine Hcl Other (See Comments)    Projectile vomiting   . Penicillins Anaphylaxis    Has patient had a PCN reaction causing immediate rash, facial/tongue/throat swelling, SOB or lightheadedness with hypotension:Yes Has patient had a PCN reaction causing severe rash involving mucus membranes or skin necrosis:Yes Has patient had a PCN reaction that required hospitalization:Treated in ER w/epi & breathing treatments Has patient had a PCN reaction occurring within the last 10 years:No If all of the above answers are "NO", then may proceed with Cephalosporin use.   . Statins Other (See Comments)    Severe leg pain - has tried most of them  . Sulfonamide Derivatives Anaphylaxis  . Banana Swelling and Cough    Inside of the mouth swells  . Food Other (See Comments) and Cough    Nuts- mucus membranes inflamed/cough   . Peanut-Containing Drug Products Swelling and Cough    Inside of the mouth swells  . Tape Hives and Dermatitis    No plastic tape!!    BP (!) 167/103   Pulse 81   Temp 99.3 F (37.4 C) (Oral)   Resp 18   Ht 5\' 5"  (1.651 m)   Wt 104.3 kg (230 lb)   SpO2 99%   BMI 38.27 kg/m   Labs: No results found for this or any previous visit (from the past 48 hour(s)).  Imaging / Studies: Ct Head Wo Contrast  Result Date: 07/07/2017 CLINICAL DATA:  Fall from ladder, head injury, posterior midline scalp hematoma close to the vertex. EXAM: CT HEAD WITHOUT CONTRAST CT CERVICAL SPINE WITHOUT CONTRAST TECHNIQUE: Multidetector CT imaging of the head and cervical spine was performed following the standard protocol without intravenous contrast. Multiplanar CT image reconstructions of the cervical spine were also generated. COMPARISON:  None. FINDINGS: CT HEAD FINDINGS Brain: No acute  intracranial hemorrhage, mass lesion, acute infarction, midline shift, herniation, hydrocephalus, or extra-axial fluid collection. Normal gray-white matter differentiation. Cisterns are patent. No cerebellar abnormality. Vascular: No hyperdense vessel or unexpected calcification. Skull: Normal. Negative for fracture or focal lesion. Sinuses/Orbits: No acute finding. Other: Posterior vertex high scalp injury with hematoma of the soft tissues. CT CERVICAL SPINE FINDINGS Alignment: Normal. Skull base and vertebrae: No acute fracture. No primary bone lesion or focal pathologic process. Soft tissues and spinal canal: No prevertebral fluid or swelling. No visible canal hematoma. Disc levels: Degenerate spondylosis, most pronounced at C5-6 with disc space narrowing, sclerosis and osteophytes. Multilevel facet arthropathy posteriorly. No malalignment, subluxation, or dislocation. Degenerative changes of the C1-2 articulation. Upper chest: Negative. Other: None. IMPRESSION: Posterior vertex high scalp injury with hematoma No acute intracranial abnormality by noncontrast CT No acute cervical spine fracture or malalignment. Cervical degenerative spondylosis. Electronically Signed   By: Jerilynn Mages.  Shick M.D.   On: 07/07/2017 20:39   Ct Cervical Spine Wo Contrast  Result Date: 07/07/2017 CLINICAL DATA:  Fall from ladder, head injury,  posterior midline scalp hematoma close to the vertex. EXAM: CT HEAD WITHOUT CONTRAST CT CERVICAL SPINE WITHOUT CONTRAST TECHNIQUE: Multidetector CT imaging of the head and cervical spine was performed following the standard protocol without intravenous contrast. Multiplanar CT image reconstructions of the cervical spine were also generated. COMPARISON:  None. FINDINGS: CT HEAD FINDINGS Brain: No acute intracranial hemorrhage, mass lesion, acute infarction, midline shift, herniation, hydrocephalus, or extra-axial fluid collection. Normal gray-white matter differentiation. Cisterns are patent. No  cerebellar abnormality. Vascular: No hyperdense vessel or unexpected calcification. Skull: Normal. Negative for fracture or focal lesion. Sinuses/Orbits: No acute finding. Other: Posterior vertex high scalp injury with hematoma of the soft tissues. CT CERVICAL SPINE FINDINGS Alignment: Normal. Skull base and vertebrae: No acute fracture. No primary bone lesion or focal pathologic process. Soft tissues and spinal canal: No prevertebral fluid or swelling. No visible canal hematoma. Disc levels: Degenerate spondylosis, most pronounced at C5-6 with disc space narrowing, sclerosis and osteophytes. Multilevel facet arthropathy posteriorly. No malalignment, subluxation, or dislocation. Degenerative changes of the C1-2 articulation. Upper chest: Negative. Other: None. IMPRESSION: Posterior vertex high scalp injury with hematoma No acute intracranial abnormality by noncontrast CT No acute cervical spine fracture or malalignment. Cervical degenerative spondylosis. Electronically Signed   By: Jerilynn Mages.  Shick M.D.   On: 07/07/2017 20:39     .Adin Hector, M.D., F.A.C.S. Gastrointestinal and Minimally Invasive Surgery [facility] [address] [place],  [address] [phone] [address] / Paging  08/06/2017 7:01 AM    Adin Hector

## 2017-08-06 NOTE — Anesthesia Postprocedure Evaluation (Signed)
Anesthesia Post Note  Patient: [name]  Procedure(s) Performed: XI ROBOTIC SIGMOID COLECTOMY ERAS PATHWAY (N/A Abdomen) PROCTOSCOPY (N/A )     Patient location during evaluation: PACU Anesthesia Type: General Level of consciousness: awake and alert Pain management: pain level controlled Vital Signs Assessment: post-procedure vital signs reviewed and stable Respiratory status: spontaneous breathing, nonlabored ventilation, respiratory function stable and patient connected to nasal cannula oxygen Cardiovascular status: blood pressure returned to baseline and stable Postop Assessment: no apparent nausea or vomiting Anesthetic complications: no    Last Vitals:  Vitals:   08/06/17 1300 08/06/17 1330  BP: 140/81 125/79  Pulse: 95 90  Resp: 16 16  Temp: 37.1 C 37.5 C  SpO2: 94% 93%    Last Pain:  Vitals:   08/06/17 1315  TempSrc:   PainSc: 2                  [place]et [place]

## 2017-08-06 NOTE — Anesthesia Procedure Notes (Signed)
Procedure Name: Intubation Date/Time: 08/06/2017 7:00 AM Performed by: Mitzie Na, CRNA Pre-anesthesia Checklist: Patient identified, Emergency Drugs available, Suction available and Patient being monitored Patient Re-evaluated:Patient Re-evaluated prior to induction Oxygen Delivery Method: Circle system utilized Preoxygenation: Pre-oxygenation with 100% oxygen Induction Type: IV induction Ventilation: Oral airway inserted - appropriate to patient size and Mask ventilation without difficulty Laryngoscope Size: Mac and 3 Grade View: Grade III Tube type: Oral Tube size: 7.0 mm Number of attempts: 1 Airway Equipment and Method: Stylet Placement Confirmation: ETT inserted through vocal cords under direct vision,  positive ETCO2 and breath sounds checked- equal and bilateral Secured at: 23 cm Tube secured with: Tape Dental Injury: Teeth and Oropharynx as per pre-operative assessment

## 2017-08-06 NOTE — H&P (Signed)
AYDE RECORD DOB: [birth year] Married / Language: English / Race: White Female  Patient Care Team: Antony Contras, MD as PCP - General (Family Medicine) Thompson Grayer, MD as PCP - Cardiology (Cardiology) Michael Boston, MD as Consulting Physician (General Surgery) Armbruster, Carlota Raspberry, MD as Consulting Physician (Gastroenterology) Serena Colonel, RN as [place] Management  ` ` Patient sent for surgical consultation at the request of Dr. Dundy Cellar  Chief Complaint: Recurrent sigmoid diverticulitis  The patient is a morbidly obese female that has been struggling with recurrent attacks of sigmoid diverticulitis. No prior attacks until July 2018. She has not been able to get off antibiotics more than a month before she gets another attack. She just came off her fourth attack earlier this month. She had a colonoscopy that showed some hyperplastic and tubular adenomatous small polyps. Left-sided colon diverticulosis. Because of the repeated flares, she discussed with her gastroenterologist. Recommendation made for considering surgical consultation. She's had a cholecystectomy, C-sections 2, tubal ligation, right inguinal hernia repair. None recently. He is to live in [place]. Most surgery done up there. Her bowels a couple times a day. When she gets diverticulitis, they run loose with diarrhea. She can walk about 20 minutes without difficulty. She does have atrial fibrillation. She is on warfarin anticoagulation. She is followed by cardiology. Dr. Rayann Heman. Usually when she gets her procedure, he transitions her to Lovenox shots. She does not smoke. She is not a diabetic. Hypertension under control.  No personal nor family history of GI/colon cancer, inflammatory bowel disease, irritable bowel syndrome, allergy such as Celiac Sprue, dietary/dairy problems, colitis, ulcers nor gastritis. No recent sick contacts/gastroenteritis. No travel  outside the country. No changes in diet. No dysphagia to solids or liquids. No significant heartburn or reflux. No hematochezia, hematemesis, coffee ground emesis. No evidence of prior gastric/peptic ulceration.  She had recent EGD and removal of gastric polyps.  I&D abd wall abscess last month - finished ABx.  [place] w Community education officer.  No other new events.  Transitioned off warfarin for surgery  (Review of systems as stated in this history (HPI) or in the review of systems. Otherwise all other 12 point ROS are negative) ` ` `   Past Surgical History (Tanisha A. Owens Shark, [place]; 06/14/2017 10:19 AM) Breast Biopsy Bilateral. Cesarean Section - Multiple Colon Polyp Removal - Colonoscopy Gallbladder Surgery - Laparoscopic Laparoscopic Inguinal Hernia Surgery Right. Oral Surgery  Diagnostic Studies History (Tanisha A. Owens Shark, [place]; 06/14/2017 10:19 AM) Colonoscopy within last year Mammogram within last year Pap Smear 1-5 years ago  Allergies (Tanisha A. Owens Shark, RMA; 06/14/2017 10:21 AM) Latex Dyes Demerol *ANALGESICS - OPIOID* Statins Penicillins Sulfa Antibiotics Allergies Reconciled  Medication History (Tanisha A. Owens Shark, RMA; 06/14/2017 10:22 AM) Losartan Potassium (25MG  Tablet, Oral) Active. Omeprazole (40MG  Capsule DR, Oral) Active. Oseltamivir Phosphate (75MG  Capsule, Oral) Active. Venlafaxine HCl (37.5MG  Tablet, Oral) Active. Warfarin Sodium (2MG  Tablet, Oral) Active. Verapamil HCl (80MG  Tablet, Oral) Active. Enoxaparin Sodium (100MG /ML Solution, Subcutaneous) Active. Medications Reconciled  Social History (Tanisha A. Owens Shark, [place]; 06/14/2017 10:19 AM) Alcohol use Occasional alcohol use. Caffeine use Tea. No drug use Tobacco use Never smoker.  Family History (Tanisha A. Owens Shark, [place]; 06/14/2017 10:19 AM) Alcohol Abuse Mother. Anesthetic complications Mother. Arthritis Father, Sister. Colon Polyps Father, Sister. Diabetes Mellitus  Father. Heart Disease Mother. Heart disease in female family member before age 10 Hypertension Father, Mother. Thyroid problems Sister.  Pregnancy / Birth History (Tanisha A. Owens Shark, [place]; 06/14/2017 10:19 AM) Age at  menarche 9 years. Age of menopause <44 Gravida 2 Maternal age 74-30 Para 2  Other Problems (Tanisha A. Owens Shark, [place]; 06/14/2017 10:19 AM) Asthma Atrial Fibrillation Back Pain Gastroesophageal Reflux Disease General anesthesia - complications Heart murmur Hemorrhoids Pulmonary Embolism / Blood Clot in Legs Sleep Apnea     Review of Systems (Tanisha A. Brown RMA; 06/14/2017 10:19 AM) General Present- Appetite Loss, Fatigue and Night Sweats. Not Present- Chills, Fever, Weight Gain and Weight Loss. Skin Not Present- Change in Wart/Mole, Dryness, Hives, Jaundice, New Lesions, Non-Healing Wounds, Rash and Ulcer. HEENT Present- Seasonal Allergies, Sinus Pain and Wears glasses/contact lenses. Not Present- Earache, Hearing Loss, Hoarseness, Nose Bleed, Oral Ulcers, Ringing in the Ears, Sore Throat, Visual Disturbances and Yellow Eyes. Respiratory Present- Difficulty Breathing and Wheezing. Not Present- Bloody sputum, Chronic Cough and Snoring. Breast Not Present- Breast Mass, Breast Pain, Nipple Discharge and Skin Changes. Cardiovascular Present- Palpitations and Shortness of Breath. Not Present- Chest Pain, Difficulty Breathing Lying Down, Leg Cramps, Rapid Heart Rate and Swelling of Extremities. Gastrointestinal Present- Abdominal Pain, Hemorrhoids and Indigestion. Not Present- Bloating, Bloody Stool, Change in Bowel Habits, Chronic diarrhea, Constipation, Difficulty Swallowing, Excessive gas, Gets full quickly at meals, Nausea, Rectal Pain and Vomiting. Female Genitourinary Present- Urgency. Not Present- Frequency, Nocturia, Painful Urination and Pelvic Pain. Musculoskeletal Present- Back Pain. Not Present- Joint Pain, Joint Stiffness, Muscle Pain, Muscle  Weakness and Swelling of Extremities. Neurological Not Present- Decreased Memory, Fainting, Headaches, Numbness, Seizures, Tingling, Tremor, Trouble walking and Weakness. Psychiatric Not Present- Anxiety, Bipolar, Change in Sleep Pattern, Depression, Fearful and Frequent crying. Endocrine Not Present- Cold Intolerance, Excessive Hunger, Hair Changes, Heat Intolerance, Hot flashes and New Diabetes. Hematology Present- Blood Thinners. Not Present- Easy Bruising, Excessive bleeding, Gland problems, HIV and Persistent Infections.  Vitals (Tanisha A. Brown RMA; 06/14/2017 10:20 AM) 06/14/2017 10:20 AM Weight: 231.8 lb Height: 64in Body Surface Area: 2.08 m Body Mass Index: 39.79 kg/m  Temp.: 98.83F  Pulse: 82 (Regular)  BP: 136/84 (Sitting, Left Arm, Standard)  BP (!) 167/103   Pulse 81   Temp 99.3 F (37.4 C) (Oral)   Resp 18   Ht 5\' 5"  (1.651 m)   Wt 104.3 kg (230 lb)   SpO2 99%   BMI 38.27 kg/m      Physical Exam Adin Hector MD; 06/14/2017 10:42 AM)  General Mental Status-Alert. General Appearance-Not in acute distress, Not Sickly. Orientation-Oriented X3. Hydration-Well hydrated. Voice-Normal.  Integumentary Global Assessment Upon inspection and palpation of skin surfaces of the - Axillae: non-tender, no inflammation or ulceration, no drainage. and Distribution of scalp and body hair is normal. General Characteristics Temperature - normal warmth is noted.  Head and Neck Head-normocephalic, atraumatic with no lesions or palpable masses. Face Global Assessment - atraumatic, no absence of expression. Neck Global Assessment - no abnormal movements, no bruit auscultated on the right, no bruit auscultated on the left, no decreased range of motion, non-tender. Trachea-midline. Thyroid Gland Characteristics - non-tender.  Eye Eyeball - Left-Extraocular movements intact, No Nystagmus. Eyeball - Right-Extraocular movements intact,  No Nystagmus. Cornea - Left-No Hazy. Cornea - Right-No Hazy. Sclera/Conjunctiva - Left-No scleral icterus, No Discharge. Sclera/Conjunctiva - Right-No scleral icterus, No Discharge. Pupil - Left-Direct reaction to light normal. Pupil - Right-Direct reaction to light normal.  ENMT Ears Pinna - Left - no drainage observed, no generalized tenderness observed. Right - no drainage observed, no generalized tenderness observed. Nose and Sinuses External Inspection of the Nose - no destructive lesion observed. Inspection of the nares -  Left - quiet respiration. Right - quiet respiration. Mouth and Throat Lips - Upper Lip - no fissures observed, no pallor noted. Lower Lip - no fissures observed, no pallor noted. Nasopharynx - no discharge present. Oral Cavity/Oropharynx - Tongue - no dryness observed. Oral Mucosa - no cyanosis observed. Hypopharynx - no evidence of airway distress observed.  Chest and Lung Exam Inspection Movements - Normal and Symmetrical. Accessory muscles - No use of accessory muscles in breathing. Palpation Palpation of the chest reveals - Non-tender. Auscultation Breath sounds - Normal and Clear.  Cardiovascular Auscultation Rhythm - Regular. Murmurs & Other Heart Sounds - Auscultation of the heart reveals - No Murmurs and No Systolic Clicks.  Abdomen Inspection Inspection of the abdomen reveals - No Visible peristalsis and No Abnormal pulsations. Umbilicus - No Bleeding, No Urine drainage. Palpation/Percussion Palpation and Percussion of the abdomen reveal - Soft, Non Tender, No Rebound tenderness, No Rigidity (guarding) and No Cutaneous hyperesthesia. Note: Morbidly obese but otherwise soft. Numerous laparoscopic and lower abdominal incisions. Otherwise soft and nontender. Nondistended. Moderate distasis recti. No umbilical or other anterior abdominal wall hernias  Female Genitourinary Sexual Maturity Tanner 5 - Adult hair pattern. Note: No  vaginal bleeding nor discharge  Peripheral Vascular Upper Extremity Inspection - Left - No Cyanotic nailbeds, Not Ischemic. Right - No Cyanotic nailbeds, Not Ischemic.  Neurologic Neurologic evaluation reveals -normal attention span and ability to concentrate, able to name objects and repeat phrases. Appropriate fund of knowledge , normal sensation and normal coordination. Mental Status Affect - not angry, not paranoid. Cranial Nerves-Normal Bilaterally. Gait-Normal.  Neuropsychiatric Mental status exam performed with findings of-able to articulate well with normal speech/language, rate, volume and coherence, thought content normal with ability to perform basic computations and apply abstract reasoning and no evidence of hallucinations, delusions, obsessions or homicidal/suicidal ideation.  Musculoskeletal Global Assessment Spine, Ribs and Pelvis - no instability, subluxation or laxity. Right Upper Extremity - no instability, subluxation or laxity.  Lymphatic Head & Neck  General Head & Neck Lymphatics: Bilateral - Description - No Localized lymphadenopathy. Axillary  General Axillary Region: Bilateral - Description - No Localized lymphadenopathy. Femoral & Inguinal  Generalized Femoral & Inguinal Lymphatics: Left - Description - No Localized lymphadenopathy. Right - Description - No Localized lymphadenopathy.    Assessment & Plan Adin Hector MD; 06/14/2017 10:49 AM)  DIVERTICULITIS OF LARGE INTESTINE WITHOUT PERFORATION OR ABSCESS WITHOUT BLEEDING (K57.32) Impression: Recurrent sigmoid diverticulitis, just getting over her fourth attack in 8 months. This most recent one rather intense.  I think she's entering a phase of chronic diverticulitis and would benefit from resection of the chronically inflamed segment. Reasonable robotic sigmoid colectomy. The ERAS protocol pathway. Because of yet another attack, she wishes to proceed with surgery.  Off  warfarin.  Cleared by cardiology.  Lovenox bridge done.  The anatomy & physiology of the digestive tract was discussed. The pathophysiology of the colon was discussed. Natural history risks without surgery was discussed. I feel the risks of no intervention will lead to serious problems that outweigh the operative risks; therefore, I recommended a partial colectomy to remove the pathology. Minimally invasive (Robotic/Laparoscopic) & open techniques were discussed.  Risks such as bleeding, infection, abscess, leak, reoperation, possible ostomy, hernia, heart attack, death, and other risks were discussed. I noted a good likelihood this will help address the problem. Goals of post-operative recovery were discussed as well. Need for adequate nutrition, daily bowel regimen and healthy physical activity, to optimize recovery was noted as  well. We will work to minimize complications. Educational materials were available as well. Questions were answered. The patient & family express understanding & wishes to proceed with surgery.  Pt Education - [facility] Colon Bowel Prep 2018 ERAS/Miralax/Antibiotics Started Neomycin Sulfate 500 MG Oral Tablet, 2 (two) Tablet SEE NOTE, #6, 06/14/2017, No Refill. Local Order: TAKE TWO TABLETS AT 2 PM, 3 PM, AND 10 PM THE DAY PRIOR TO SURGERY Started Flagyl 500 MG Oral Tablet, 2 (two) Tablet SEE NOTE, #6, 06/14/2017, No Refill. Local Order: Take at 2pm, 3pm, and 10pm the day prior to your colon operation Pt Education - Pamphlet Given - Laparoscopic Colorectal Surgery: discussed with patient and provided information. Pt Education - [facility] Colectomy post-op instructions: discussed with patient and provided information.  Adin Hector, M.D., F.A.C.S. Gastrointestinal and Minimally Invasive Surgery [facility] [address], [place], [place] [address] [phone] Main / Paging

## 2017-08-06 NOTE — Plan of Care (Signed)
Pt alert and oriented, resting, pain controlled at this time.  OOB to chair and ambulated to bathroom.  Will ambulate in hall when pt ready.  Some dizziness with ambulation.  Serous drainage from lap site on RLQ-dressing changed due to saturation.  RN will monitor.

## 2017-08-06 NOTE — Op Note (Signed)
08/06/2017  9:59 AM  PATIENT:  [name]  62 y.o. female  Patient Care Team: Antony Contras, MD as PCP - General (Family Medicine) Thompson Grayer, MD as PCP - Cardiology (Cardiology) Michael Boston, MD as Consulting Physician (General Surgery) Armbruster, Carlota Raspberry, MD as Consulting Physician (Gastroenterology) Serena Colonel, RN as [place] Management  PRE-OPERATIVE DIAGNOSIS:  Recurrent sigmoid diverticulitis  POST-OPERATIVE DIAGNOSIS: Recurrent sigmoid diverticulitis  PROCEDURE:   XI ROBOTIC SIGMOID COLECTOMY RIGID PROCTOSCOPY  SURGEON:  Adin Hector, MD  ASSISTANT: Leighton Ruff, MD, FACS. Neko Futhey, PA-S, [employer]   ANESTHESIA:   local and general  EBL:  Total I/O In: -  Out: 100 [Blood:100]  Delay start of Pharmacological VTE agent (>24hrs) due to surgical blood loss or risk of bleeding:  no  DRAINS: none   SPECIMEN:  Rectosigmoid colon with distal anastomotic ring  DISPOSITION OF SPECIMEN:  PATHOLOGY  COUNTS:  YES  PLAN OF CARE: Admit to inpatient   PATIENT DISPOSITION:  PACU - hemodynamically stable.  INDICATION:    Please continue with current episodes of sigmoid diverticulitis.  Repeated flares.  Discussed with gastroenterology.  Surgical consultation requested.  I recommended segmental resection.  On chronic anticoagulation.  Cleared by cardiology.  Transition to Lovenox bridge.  No prep done.  She is ready for surgery.:  The anatomy & physiology of the digestive tract was discussed.  The pathophysiology was discussed.  Natural history risks without surgery was discussed.   I worked to give an overview of the disease and the frequent need to have multispecialty involvement.  I feel the risks of no intervention will lead to serious problems that outweigh the operative risks; therefore, I recommended a partial colectomy to remove the pathology.  Laparoscopic & open techniques were discussed.   Risks such as  bleeding, infection, abscess, leak, reoperation, possible ostomy, hernia, heart attack, death, and other risks were discussed.  I noted a good likelihood this will help address the problem.   Goals of post-operative recovery were discussed as well.  We will work to minimize complications.  Educational materials on the pathology had been given in the office.  Questions were answered.    The patient expressed understanding & wished to proceed with surgery.  OR FINDINGS:   Patient had thickened inflamed proximal sigmoid colon with some redundancy & twisting.  No obvious metastatic disease on visceral parietal peritoneum or liver.  There is a 28 EEA Covidien colorectal stapled anastomosis.  The anastomosis rests 12 cm from the anal verge by rigid proctoscopy.  DESCRIPTION:   Informed consent was confirmed.  The patient underwent general anaesthesia without difficulty.  The patient was positioned appropriately.  VTE prevention in place.  The patient's abdomen was clipped, prepped, & draped in a sterile fashion.  Surgical timeout confirmed our plan.  The patient was positioned in reverse Trendelenburg.  Abdominal entry was gained using Varess technique at the left subcostal ridge.  Port placed in the upper abdomen.  Inspection revealed no injury.  Entry was clean.  I induced carbon dioxide insufflation.  Camera inspection revealed no injury.  Extra ports were carefully placed under direct laparoscopic visualization.  I reflected the greater omentum and the upper abdomen the small bowel in the upper abdomen.  The patient was carefully positioned.  The Intuitive daVinci robot was carefully docked with camera & instruments carefully placed.  The patient had thickened rectosigmoid with some tortuosity.  Area corresponded with prior CT scans of  diverticulitis.  Rectosigmoid colon elevated anteriorly.  I scored the base of peritoneum of the medial side of the mesentery of the left colon from the ligament of  Treitz to the peritoneal reflection of the mid rectum.   I elevated the sigmoid mesentery and entered into the retro-mesenteric plane. We were able to identify the left ureter and gonadal vessels. We kept those posterior within the retroperitoneum and elevated the left colon mesentery off that. I did isolate the inferior mesenteric artery (IMA) pedicle but did not ligate it yet.  I continued distally and got into the avascular plane posterior to the mesorectum. This allowed me to help mobilize the rectum as well by freeing the mesorectum off the sacrum.  I mobilized the peritoneal coverings towards the peritoneal reflection on both the right and left sides of the rectum.  I stayed away from the right and left ureters.  I kept the lateral vascular pedicles to the rectum intact.  I mobilized the left colon in a lateral to medial fashion off the line of [place] up towards the splenic flexure to ensure good mobilization of the remaining left colon to reach into the pelvis.  I skeletonized the lymph nodes off the inferior mesenteric artery pedicle.  I went down to its takeoff from the aorta.  I isolated the inferior mesenteric vein off of the ligament of Treitz just cephalad to that as well.  After confirming the left ureter was out of the way, I went ahead and ligated the sigmoid arterial pedicle just near its takeoff from the aorta.  I did ligate the inferior mesenteric vein in a similar fashion.  We ensured hemostasis. I skeletonized the mesorectum at the junction at the proximal rectum for the distal point of resection.  I skeletonized at the proximal mesorectum and transected at the proximal rectum using a robotic 45 mm stapler.  95% across first firing.  One more firing from the left lateral corner.   Did more mobilization of the splenic flexure of the colon off the retroperitoneum.  Mobilized up to the splenic flexure lateral to medial.  No need to do a formal splenic flexure mobilization.  I chose a region at the  descending/sigmoid junction that was soft and easily reached down to the rectal stump.  I transected the mesentery of the mid descending noninflamed colon radially to preserve remaining colon blood supply.  I created an extraction incision through a small Pfannenstiel incision in the suprapubic region where the 78mm staple port had been.  Placed a wound protector.  I was able to eviscerate the rectosigmoid and descending colon out the wound.   I clamped the colon proximal to this area using a reusable pursestringer device.  Passed a 2-0 Keith needle. I transected at the descending/sigmoid junction with a scalpel. I got healthy bleeding mucosa.  We sent the rectosigmoid colon specimen off to go to pathology.  We sized the colon orifice.  I chose a 33 EEA anvil stapler system.  I reinforced the prolene pursestring with interrupted silk suture.  I placed the anvil to the open end of the proximal remaining colon and closed around it using the pursestring.    We did copious irrigation with crystalloid solution.  Hemostasis was good.  The distal end of the remaining colon easily reached down to the rectal stump, therefore, splenic flexure mobilization was not needed.      Dr Marcello Moores scrubbed down and did gentle anal dilation and advanced the EEA stapler up the rectal  stump. The spike was brought out at the provimal end of the rectal stump under direct visualization.  I attached the anvil of the proximal colon the spike of the stapler. Anvil was tightened down and held clamped for 60 seconds. The EEA stapler was fired and held clamped for 30 seconds. The stapler was released & removed. We noted 2 excellent anastomotic rings. Blue stitch is in the proximal ring.  Dr Marcello Moores did rigid proctoscopy noted the anastomosis was at 12 cm from the anal verge consistent with the proximal rectum.  We did a final irrigation of antibiotic solution (900 mg clindamycin/240 mg gentamicin in a liter of crystalloid) & held that for the  pelvic air leak test .  The rectum was insufflated the rectum while clamping the colon proximal to that anastomosis.  There was a negative air leak test. There was no tension of mesentery or bowel at the anastomosis.   Tissues looked viable.  Ureters & bowel uninjured.  The anastomosis looked healthy.  Endoluminal gas was evacuated.  Ports & wound protector removed.    We changed gloves & redraped the patient per colon SSI prevention protocol.  We aspirated the antibiotic irrigation.  Hemostasis was good.  Sterile unused instruments were used from this point.  I closed the skin at the port sites using Monocryl stitch and sterile dressing.  I closed the extraction wound using a 0 Vicryl vertical peritoneal closure and a #1 PDS transverse anterior rectal fascial closure like a small Pfannenstiel closure. I closed the skin with some interrupted Monocryl stitches. I placed antibiotic-soaked wicks into the closure at the corners x2.  I placed sterile dressings.  Because of her prior Tegaderm and other skin glue allergies, we used gauze and paper tape.     Patient is being extubated go to recovery room. I had discussed postop care with the patient in detail the office & in the holding area. Instructions are written. I discussed operative findings, updated the patient's status, discussed probable steps to recovery, and gave postoperative recommendations to the patient's family.  Recommendations were made.  Questions were answered.  They expressed understanding & appreciation.   Adin Hector, M.D., F.A.C.S. Gastrointestinal and Minimally Invasive Surgery [facility] [address] [place], [place] [address] [phone] Main / Paging

## 2017-08-07 ENCOUNTER — Encounter (HOSPITAL_COMMUNITY): Payer: Self-pay

## 2017-08-07 LAB — CBC
HEMATOCRIT: 29.8 % — AB (ref 36.0–46.0)
HEMOGLOBIN: 9.6 g/dL — AB (ref 12.0–15.0)
MCH: 28.5 pg (ref 26.0–34.0)
MCHC: 32.2 g/dL (ref 30.0–36.0)
MCV: 88.4 fL (ref 78.0–100.0)
Platelets: 343 10*3/uL (ref 150–400)
RBC: 3.37 MIL/uL — AB (ref 3.87–5.11)
RDW: 14.2 % (ref 11.5–15.5)
WBC: 16.3 10*3/uL — ABNORMAL HIGH (ref 4.0–10.5)

## 2017-08-07 LAB — MAGNESIUM: MAGNESIUM: 1.4 mg/dL — AB (ref 1.7–2.4)

## 2017-08-07 LAB — BASIC METABOLIC PANEL
Anion gap: 12 (ref 5–15)
BUN: 7 mg/dL (ref 6–20)
CALCIUM: 9 mg/dL (ref 8.9–10.3)
CHLORIDE: 102 mmol/L (ref 101–111)
CO2: 24 mmol/L (ref 22–32)
Creatinine, Ser: 0.71 mg/dL (ref 0.44–1.00)
GFR calc non Af Amer: >60 mL/min (ref >60)
GLUCOSE: 134 mg/dL — AB (ref 65–99)
POTASSIUM: 4.1 mmol/L (ref 3.5–5.1)
Sodium: 138 mmol/L (ref 135–145)

## 2017-08-07 LAB — PROTIME-INR
INR: 1.16
Prothrombin Time: 14.7 seconds (ref 11.4–15.2)

## 2017-08-07 NOTE — Progress Notes (Signed)
ANTICOAGULATION CONSULT NOTE - Initial Consult  Pharmacy Consult for LMWH bridge and coumadin Indication: atrial fibrillation and hx PE/DVTs  Allergies  Allergen Reactions  . Aspirin Other (See Comments)    Makes asthma worse and makes stomach hurt  . Ivp Dye [Iodinated Diagnostic Agents] Anaphylaxis    Required epinephrine. Since then, has tolerated with premed.  . Latex Other (See Comments) and Cough    Wheezing/cough   . Meperidine Hcl Other (See Comments)    Projectile vomiting   . Penicillins Anaphylaxis    Has patient had a PCN reaction causing immediate rash, facial/tongue/throat swelling, SOB or lightheadedness with hypotension:Yes Has patient had a PCN reaction causing severe rash involving mucus membranes or skin necrosis:Yes Has patient had a PCN reaction that required hospitalization:Treated in ER w/epi & breathing treatments Has patient had a PCN reaction occurring within the last 10 years:No If all of the above answers are "NO", then may proceed with Cephalosporin use.   . Statins Other (See Comments)    Severe leg pain - has tried most of them  . Sulfonamide Derivatives Anaphylaxis  . Banana Swelling and Cough    Inside of the mouth swells  . Food Other (See Comments) and Cough    Nuts- mucus membranes inflamed/cough   . Peanut-Containing Drug Products Swelling and Cough    Inside of the mouth swells  . Tape Hives and Dermatitis    No plastic tape!!    Patient Measurements: Height: 5\' 5"  (165.1 cm) Weight: 231 lb (104.8 kg) IBW/kg (Calculated) : 57   Vital Signs: Temp: 98.8 F (37.1 C) (05/18 0949) Temp Source: Oral (05/18 0949) BP: 135/66 (05/18 0949) Pulse Rate: 82 (05/18 0949)  Labs: Recent Labs    08/07/17 0447  HGB 9.6*  HCT 29.8*  PLT 343  LABPROT 14.7  INR 1.16  CREATININE 0.71    Estimated Creatinine Clearance: 86.5 mL/min (by C-G formula based on SCr of 0.71 mg/dL).   Medical History: Past Medical History:  Diagnosis Date  .  Allergic rhinitis   . Arthritis    "back" (05/07/2016)  . Asthma   . Atrial flutter ([place])    a. Remotely per notes.  . Carpal tunnel syndrome, bilateral   . Colon polyps   . Diverticulitis   . DVT (deep venous thrombosis) ([place]) 1980s x2 -    between birth of two sons. Saw hematology - was told she has a hypercoagulable disorder, should be on Coumadin lifelong.  Marland Kitchen Dysrhythmia    atrial fib  . Family history of adverse reaction to anesthesia    "mother gets PONV"  . Gallbladder disease   . GERD (gastroesophageal reflux disease)   . Halothane adverse reaction    narrow small opening  . Heart murmur   . Hiatal hernia   . History of hiatal hernia    "small one/CTA 05/01/2016" (05/07/2016)  . HTN (hypertension)   . Hypercholesteremia    hx; "brought it down w/diet" (05/07/2016)  . Hyperglycemia    a. A1c 6.1 in 2014.  . Inguinal hernia   . Left sided sciatica   . Normal cardiac stress test 06/2012  . Obesity   . OSA (obstructive sleep apnea)    a. Mild, did not tolerate CPAP (05/07/2016)  . PAF (paroxysmal atrial fibrillation) ([place])    a. s/p afib ablation at [facility] (Dr. Annabell Howells). b. Recurrent AF s/p DCCV 06/2010. c. On flecainide.   Marland Kitchen PONV (postoperative nausea and vomiting)   . PPD  positive, treated 1977   "treated for 1 yr after exposure to patient"  . Pre-diabetes   . RSV infection ~ 2006  . Wandering (atrial) pacemaker    a. Remotely per notes.  (  pt. states has a wandering p wave ) no pacemaker    Medications:  Medications Prior to Admission  Medication Sig Dispense Refill Last Dose  . acetaminophen (TYLENOL) 650 MG CR tablet Take 1,300 mg by mouth 2 (two) times daily.   08/05/2017 at 1600  . cetirizine (ZYRTEC) 10 MG tablet Take 10 mg by mouth daily.     08/06/2017 at 0400  . Cholecalciferol (VITAMIN D3) 1000 UNITS CAPS Take 2,000 Units by mouth daily.    08/05/2017 at 0800  . dicyclomine (BENTYL) 10 MG capsule Take 1 tablet by mouth 30 min before meals and at  bedtime as needed for pain, cramping. 120 capsule 1 08/04/2017 at 2000  . enoxaparin (LOVENOX) 100 MG/ML injection Inject 1 mL (100 mg total) into the skin every 12 (twelve) hours. 10 Syringe 0 08/05/2017 at 0800  . FLOVENT HFA 220 MCG/ACT inhaler Inhale 1 puff into the lungs 2 (two) times daily.  1 08/06/2017 at 0400  . fluticasone (FLONASE) 50 MCG/ACT nasal spray Place 1 spray into both nostrils 2 (two) times daily.    08/06/2017 at 0400  . losartan (COZAAR) 25 MG tablet Take 1 tablet (25 mg total) by mouth daily. 90 tablet 0 08/05/2017 at 0800  . Multiple Vitamin (MULTIVITAMIN WITH MINERALS) TABS tablet Take 1 tablet by mouth daily.   08/05/2017 at 0800  . omeprazole (PRILOSEC) 40 MG capsule Take 40 mg by mouth 2 (two) times daily.   08/06/2017 at 0400  . polyethylene glycol (MIRALAX) packet Take 17 g by mouth every evening.    08/05/2017 at Unknown time  . sucralfate (CARAFATE) 1 GM/10ML suspension Take 10 mLs (1 g total) by mouth every 6 (six) hours as needed. (Patient taking differently: Take 1 g by mouth every 6 (six) hours as needed (for GERD symptoms). ) 420 mL 1 Not yet at Not yet  . venlafaxine XR (EFFEXOR-XR) 37.5 MG 24 hr capsule Take 37.5 mg by mouth daily with breakfast.    08/06/2017 at 0400  . verapamil (CALAN) 80 MG tablet Take 160 mg by mouth 2 (two) times daily.   08/05/2017 at 2200  . PROAIR HFA 108 (90 BASE) MCG/ACT inhaler Inhale 2 puffs into the lungs every 6 (six) hours as needed for wheezing or shortness of breath.    More than a month at Unknown time  . warfarin (COUMADIN) 2 MG tablet TAKE AS DIRECTED BY COUMADIN CLINIC (Patient taking differently: Take 4-6 mg by mouth See admin instructions. Take 6 mg by mouth in the morning on Sun/Tues/Thurs/Sat and 4 mg on Mon/Wed/Fri) 225 tablet 1 07/31/2017    Assessment: 62 yo F s/p XI ROBOTIC SIGMOID COLECTOMY.  She was on coumadin PTA for afib and PMH PE/DVTs.  PTA she was on LMWH bridge 100 mg sq q12h.  Home coumadin dose: 6 mg qd x 4 mg  MWF.   Goal of Therapy:  INR 2-3 Monitor platelets by anticoagulation protocol: Yes   Plan:  On LMWH 40 qday currently F/u AM Hg and BP and start LMWH 100 q12 and coumadin if Hg > 7 and BP ok per MD orders  Eudelia Bunch, Pharm.D. [phone] 08/07/2017 10:28 AM

## 2017-08-07 NOTE — Progress Notes (Signed)
1 Day Post-Op   Subjective/Chief Complaint: Doing well some gas   Objective: Vital signs in last 24 hours: Temp:  [98 F (36.7 C)-100.2 F (37.9 C)] 98.7 F (37.1 C) (05/18 0517) Pulse Rate:  [83-110] 83 (05/18 0517) Resp:  [14-25] 14 (05/18 0517) BP: (123-158)/(58-95) 125/72 (05/18 0517) SpO2:  [92 %-100 %] 93 % (05/18 0517) Weight:  [104.8 kg (231 lb)] 104.8 kg (231 lb) (05/18 0517) Last BM Date: 08/06/17  Intake/Output from previous day: 05/17 0701 - 05/18 0700 In: 2739.5 [P.O.:910; I.V.:1620; IV Piggyback:209.5] Out: 1925 [Urine:1825; Blood:100] Intake/Output this shift: Total I/O In: 120 [P.O.:120] Out: 350 [Urine:350]  Incision/Wound:CDI soft   Afib   CTA   Lab Results:  Recent Labs    08/07/17 0447  WBC 16.3*  HGB 9.6*  HCT 29.8*  PLT 343   BMET Recent Labs    08/07/17 0447  NA 138  K 4.1  CL 102  CO2 24  GLUCOSE 134*  BUN 7  CREATININE 0.71  CALCIUM 9.0   PT/INR Recent Labs    08/07/17 0447  LABPROT 14.7  INR 1.16   ABG No results for input(s): PHART, HCO3 in the last 72 hours.  Invalid input(s): PCO2, PO2  Studies/Results: No results found.  Anti-infectives: Anti-infectives (From admission, onward)   Start     Dose/Rate Route Frequency Ordered Stop   08/06/17 1600  clindamycin (CLEOCIN) IVPB 900 mg     900 mg 100 mL/hr over 30 Minutes Intravenous Every 8 hours 08/06/17 1402 08/06/17 1640   08/06/17 1400  neomycin (MYCIFRADIN) tablet 1,000 mg  Status:  Discontinued     1,000 mg Oral 3 times per day 08/06/17 0541 08/06/17 0553   08/06/17 1400  metroNIDAZOLE (FLAGYL) tablet 1,000 mg  Status:  Discontinued     1,000 mg Oral 3 times per day 08/06/17 0541 08/06/17 0553   08/06/17 0955  clindamycin (CLEOCIN) 900 mg, gentamicin (GARAMYCIN) 240 mg in sodium chloride 0.9 % 1,000 mL for intraperitoneal lavage  Status:  Discontinued       As needed 08/06/17 1010 08/06/17 1015   08/06/17 0600  gentamicin (GARAMYCIN) 380 mg in dextrose 5  % 100 mL IVPB     5 mg/kg  76 kg (Adjusted) 109.5 mL/hr over 60 Minutes Intravenous 60 min pre-op 08/05/17 1251 08/06/17 0755   08/06/17 0600  clindamycin (CLEOCIN) 900 mg, gentamicin (GARAMYCIN) 240 mg in sodium chloride 0.9 % 1,000 mL for intraperitoneal lavage  Status:  Discontinued      Intraperitoneal To Surgery 08/05/17 1251 08/06/17 1339   08/05/17 1251  clindamycin (CLEOCIN) IVPB 900 mg     900 mg 100 mL/hr over 30 Minutes Intravenous 60 min pre-op 08/05/17 1251 08/06/17 0739      Assessment/Plan: s/p Procedure(s): XI ROBOTIC SIGMOID COLECTOMY ERAS PATHWAY (N/A) PROCTOSCOPY (N/A) Doing well Advance diet ambulate check labs  D/C foley in am Anticoagulation can restart Sunday if hgb more than 7   LOS: 1 day    [name] A Reilley Valentine 08/07/2017

## 2017-08-08 LAB — CBC
HEMATOCRIT: 30.5 % — AB (ref 36.0–46.0)
Hemoglobin: 9.5 g/dL — ABNORMAL LOW (ref 12.0–15.0)
MCH: 27.9 pg (ref 26.0–34.0)
MCHC: 31.1 g/dL (ref 30.0–36.0)
MCV: 89.4 fL (ref 78.0–100.0)
Platelets: 348 10*3/uL (ref 150–400)
RBC: 3.41 MIL/uL — ABNORMAL LOW (ref 3.87–5.11)
RDW: 14.4 % (ref 11.5–15.5)
WBC: 12.8 10*3/uL — AB (ref 4.0–10.5)

## 2017-08-08 LAB — COMPREHENSIVE METABOLIC PANEL
ALBUMIN: 3.2 g/dL — AB (ref 3.5–5.0)
ALT: 23 U/L (ref 14–54)
AST: 23 U/L (ref 15–41)
Alkaline Phosphatase: 55 U/L (ref 38–126)
Anion gap: 13 (ref 5–15)
BUN: 8 mg/dL (ref 6–20)
CHLORIDE: 102 mmol/L (ref 101–111)
CO2: 23 mmol/L (ref 22–32)
Calcium: 9.2 mg/dL (ref 8.9–10.3)
Creatinine, Ser: 0.66 mg/dL (ref 0.44–1.00)
GFR calc Af Amer: >60 mL/min (ref >60)
GFR calc non Af Amer: >60 mL/min (ref >60)
GLUCOSE: 112 mg/dL — AB (ref 65–99)
POTASSIUM: 3.9 mmol/L (ref 3.5–5.1)
Sodium: 138 mmol/L (ref 135–145)
TOTAL PROTEIN: 6.5 g/dL (ref 6.5–8.1)
Total Bilirubin: 0.2 mg/dL — ABNORMAL LOW (ref 0.3–1.2)

## 2017-08-08 LAB — PROTIME-INR
INR: 1.1
Prothrombin Time: 14.1 seconds (ref 11.4–15.2)

## 2017-08-08 MED ORDER — WARFARIN - PHARMACIST DOSING INPATIENT: Freq: Every day | Status: DC

## 2017-08-08 MED ORDER — ENOXAPARIN SODIUM 100 MG/ML ~~LOC~~ SOLN
100.0000 mg | Freq: Two times a day (BID) | SUBCUTANEOUS | Status: DC
Start: 2017-08-08 — End: 2017-08-11
  Administered 2017-08-08 – 2017-08-11 (×6): 100 mg via SUBCUTANEOUS
  Filled 2017-08-08 (×8): qty 1

## 2017-08-08 MED ORDER — VERAPAMIL HCL 80 MG PO TABS
160.0000 mg | ORAL_TABLET | Freq: Two times a day (BID) | ORAL | Status: DC
  Administered 2017-08-08: 160 mg via ORAL
  Filled 2017-08-08 (×2): qty 2

## 2017-08-08 MED ORDER — WARFARIN SODIUM 6 MG PO TABS
6.0000 mg | ORAL_TABLET | Freq: Once | ORAL | Status: AC
  Administered 2017-08-08: 6 mg via ORAL
  Filled 2017-08-08: qty 1

## 2017-08-08 NOTE — Progress Notes (Signed)
2 Days Post-Op   Subjective/Chief Complaint: No BM feels ok except heartburn  No vomiting    Objective: Vital signs in last 24 hours: Temp:  [98.8 F (37.1 C)-100.2 F (37.9 C)] 100.2 F (37.9 C) (05/19 0528) Pulse Rate:  [82-102] 102 (05/19 0528) Resp:  [15-18] 18 (05/19 0528) BP: (133-148)/(49-89) 133/49 (05/19 0528) SpO2:  [93 %-95 %] 94 % (05/19 0528) Weight:  [104.7 kg (230 lb 13.2 oz)] 104.7 kg (230 lb 13.2 oz) (05/19 0412) Last BM Date: 08/06/17  Intake/Output from previous day: 05/18 0701 - 05/19 0700 In: 1440 [P.O.:1440] Out: 3650 [Urine:3650] Intake/Output this shift: No intake/output data recorded.  Incision/Wound:open clean wicks in place minimal drainage  Soft ND NT   Lab Results:  Recent Labs    08/07/17 0447 08/08/17 0417  WBC 16.3* 12.8*  HGB 9.6* 9.5*  HCT 29.8* 30.5*  PLT 343 348   BMET Recent Labs    08/07/17 0447 08/08/17 0417  NA 138 138  K 4.1 3.9  CL 102 102  CO2 24 23  GLUCOSE 134* 112*  BUN 7 8  CREATININE 0.71 0.66  CALCIUM 9.0 9.2   PT/INR Recent Labs    08/07/17 0447 08/08/17 0417  LABPROT 14.7 14.1  INR 1.16 1.10   ABG No results for input(s): PHART, HCO3 in the last 72 hours.  Invalid input(s): PCO2, PO2  Studies/Results: No results found.  Anti-infectives: Anti-infectives (From admission, onward)   Start     Dose/Rate Route Frequency Ordered Stop   08/06/17 1600  clindamycin (CLEOCIN) IVPB 900 mg     900 mg 100 mL/hr over 30 Minutes Intravenous Every 8 hours 08/06/17 1402 08/06/17 1640   08/06/17 1400  neomycin (MYCIFRADIN) tablet 1,000 mg  Status:  Discontinued     1,000 mg Oral 3 times per day 08/06/17 0541 08/06/17 0553   08/06/17 1400  metroNIDAZOLE (FLAGYL) tablet 1,000 mg  Status:  Discontinued     1,000 mg Oral 3 times per day 08/06/17 0541 08/06/17 0553   08/06/17 0955  clindamycin (CLEOCIN) 900 mg, gentamicin (GARAMYCIN) 240 mg in sodium chloride 0.9 % 1,000 mL for intraperitoneal lavage  Status:   Discontinued       As needed 08/06/17 1010 08/06/17 1015   08/06/17 0600  gentamicin (GARAMYCIN) 380 mg in dextrose 5 % 100 mL IVPB     5 mg/kg  76 kg (Adjusted) 109.5 mL/hr over 60 Minutes Intravenous 60 min pre-op 08/05/17 1251 08/06/17 0755   08/06/17 0600  clindamycin (CLEOCIN) 900 mg, gentamicin (GARAMYCIN) 240 mg in sodium chloride 0.9 % 1,000 mL for intraperitoneal lavage  Status:  Discontinued      Intraperitoneal To Surgery 08/05/17 1251 08/06/17 1339   08/05/17 1251  clindamycin (CLEOCIN) IVPB 900 mg     900 mg 100 mL/hr over 30 Minutes Intravenous 60 min pre-op 08/05/17 1251 08/06/17 0739      Assessment/Plan: s/p Procedure(s): XI ROBOTIC SIGMOID COLECTOMY ERAS PATHWAY (N/A) PROCTOSCOPY (N/A) ADVANCE DIET CAN START BACK ON ANTICOAGULATION  AMBULATE   LOS: 2 days    Marcello Moores A Hayla Hinger 08/08/2017

## 2017-08-08 NOTE — Progress Notes (Signed)
Foley catheter removed at this time per orders. Pt tolerated procedure well, acknowledges when to notify nursing staff. Currently patient ambulating in the hall.

## 2017-08-08 NOTE — Progress Notes (Signed)
Pt called nurse's station and reported that "my catheter came apart." This writer responded to room and patient stated that she was "sleeping and woke up feeling wet and noticed that her catheter had separated and was leaking in the bed." Reports that she connected the catheter back to the drainage bag. This Probation officer obtained new drainage bag and cleaned connecting portion of catheter with alcohol prep and secured catheter to tubing of new drainage bag. Bed linens removed, bed wiped down with sanitizing wipes and allowed to dry, fresh linens applied to bed. Patient given partial bath at this time, with back dried and lotion applied. Tolerated well.

## 2017-08-08 NOTE — Progress Notes (Signed)
Pt states that Tramadol does not work for her pain.

## 2017-08-08 NOTE — Progress Notes (Addendum)
ANTICOAGULATION CONSULT NOTE -  Pharmacy Consult for LMWH bridge and coumadin Indication: atrial fibrillation and hx PE/DVTs  Allergies  Allergen Reactions  . Aspirin Other (See Comments)    Makes asthma worse and makes stomach hurt  . Ivp Dye [Iodinated Diagnostic Agents] Anaphylaxis    Required epinephrine. Since then, has tolerated with premed.  . Latex Other (See Comments) and Cough    Wheezing/cough   . Meperidine Hcl Other (See Comments)    Projectile vomiting   . Penicillins Anaphylaxis    Has patient had a PCN reaction causing immediate rash, facial/tongue/throat swelling, SOB or lightheadedness with hypotension:Yes Has patient had a PCN reaction causing severe rash involving mucus membranes or skin necrosis:Yes Has patient had a PCN reaction that required hospitalization:Treated in ER w/epi & breathing treatments Has patient had a PCN reaction occurring within the last 10 years:No If all of the above answers are "NO", then may proceed with Cephalosporin use.   . Statins Other (See Comments)    Severe leg pain - has tried most of them  . Sulfonamide Derivatives Anaphylaxis  . Banana Swelling and Cough    Inside of the mouth swells  . Food Other (See Comments) and Cough    Nuts- mucus membranes inflamed/cough   . Peanut-Containing Drug Products Swelling and Cough    Inside of the mouth swells  . Tape Hives and Dermatitis    No plastic tape!!    Patient Measurements: Height: 5\' 5"  (165.1 cm) Weight: 230 lb 13.2 oz (104.7 kg) IBW/kg (Calculated) : 57   Vital Signs: Temp: 100.2 F (37.9 C) (05/19 0528) Temp Source: Oral (05/19 0528) BP: 133/49 (05/19 0528) Pulse Rate: 102 (05/19 0528)  Labs: Recent Labs    08/07/17 0447 08/08/17 0417  HGB 9.6* 9.5*  HCT 29.8* 30.5*  PLT 343 348  LABPROT 14.7 14.1  INR 1.16 1.10  CREATININE 0.71 0.66    Estimated Creatinine Clearance: 86.5 mL/min (by C-G formula based on SCr of 0.66 mg/dL).  Assessment: 62 yo F s/p  XI ROBOTIC SIGMOID COLECTOMY.  She was on coumadin PTA for afib and PMH PE/DVTs.  PTA she was on LMWH bridge 100 mg sq q12h.  Home coumadin dose: 6 mg qd x 4 mg MWF.   08/08/2017 Hg 9.5, BP stable.  INR 1.1 OK to resume anticoag per [facility] chart note  Goal of Therapy:  INR 2-3 Monitor platelets by anticoagulation protocol: Yes   Plan:  LMWH 40 mg given at 0909 Change to LMWH 100 q12 at 2200 (give in THIGH per Dr Johney Maine) Coumadin 6 mg po x 1 dose today Daily INR  Eudelia Bunch, Pharm.D. [phone] 08/08/2017 9:33 AM

## 2017-08-09 LAB — PROTIME-INR
INR: 1.05
PROTHROMBIN TIME: 13.6 s (ref 11.4–15.2)

## 2017-08-09 LAB — CBC
HCT: 33.7 % — ABNORMAL LOW (ref 36.0–46.0)
Hemoglobin: 10.9 g/dL — ABNORMAL LOW (ref 12.0–15.0)
MCH: 28.7 pg (ref 26.0–34.0)
MCHC: 32.3 g/dL (ref 30.0–36.0)
MCV: 88.7 fL (ref 78.0–100.0)
PLATELETS: 380 10*3/uL (ref 150–400)
RBC: 3.8 MIL/uL — AB (ref 3.87–5.11)
RDW: 14.2 % (ref 11.5–15.5)
WBC: 11.7 10*3/uL — AB (ref 4.0–10.5)

## 2017-08-09 MED ORDER — VENLAFAXINE HCL ER 37.5 MG PO CP24
37.5000 mg | ORAL_CAPSULE | Freq: Every day | ORAL | Status: DC
  Administered 2017-08-09 – 2017-08-11 (×3): 37.5 mg via ORAL
  Filled 2017-08-09 (×4): qty 1

## 2017-08-09 MED ORDER — LACTATED RINGERS IV BOLUS
1000.0000 mL | Freq: Three times a day (TID) | INTRAVENOUS | Status: AC | PRN
Start: 2017-08-09 — End: 2017-08-11
  Administered 2017-08-09: 1000 mL via INTRAVENOUS

## 2017-08-09 MED ORDER — PANTOPRAZOLE SODIUM 40 MG PO TBEC
80.0000 mg | DELAYED_RELEASE_TABLET | Freq: Every day | ORAL | Status: DC
  Administered 2017-08-09: 80 mg via ORAL
  Filled 2017-08-09 (×3): qty 2

## 2017-08-09 MED ORDER — LOSARTAN POTASSIUM 25 MG PO TABS
25.0000 mg | ORAL_TABLET | Freq: Every day | ORAL | Status: DC
  Administered 2017-08-09 – 2017-08-11 (×3): 25 mg via ORAL
  Filled 2017-08-09 (×3): qty 1

## 2017-08-09 MED ORDER — PSYLLIUM 95 % PO PACK: 1.0000 | PACK | Freq: Every day | ORAL | Status: DC

## 2017-08-09 MED ORDER — BUDESONIDE 0.5 MG/2ML IN SUSP
0.5000 mg | Freq: Two times a day (BID) | RESPIRATORY_TRACT | Status: DC
  Administered 2017-08-09 – 2017-08-11 (×5): 0.5 mg via RESPIRATORY_TRACT
  Filled 2017-08-09 (×5): qty 2

## 2017-08-09 MED ORDER — SUCRALFATE 1 GM/10ML PO SUSP: 1.0000 g | Freq: Four times a day (QID) | ORAL | Status: DC | PRN

## 2017-08-09 MED ORDER — LORATADINE 10 MG PO TABS
10.0000 mg | ORAL_TABLET | Freq: Every day | ORAL | Status: DC
  Administered 2017-08-09: 10 mg via ORAL
  Filled 2017-08-09 (×3): qty 1

## 2017-08-09 MED ORDER — ALBUTEROL SULFATE (2.5 MG/3ML) 0.083% IN NEBU
2.5000 mg | INHALATION_SOLUTION | Freq: Four times a day (QID) | RESPIRATORY_TRACT | Status: DC | PRN
Start: 2017-08-09 — End: 2017-08-11

## 2017-08-09 MED ORDER — DICYCLOMINE HCL 10 MG PO CAPS
10.0000 mg | ORAL_CAPSULE | Freq: Four times a day (QID) | ORAL | Status: DC | PRN
Start: 2017-08-09 — End: 2017-08-11
  Administered 2017-08-09 – 2017-08-11 (×8): 10 mg via ORAL
  Filled 2017-08-09 (×8): qty 1

## 2017-08-09 MED ORDER — ADULT MULTIVITAMIN W/MINERALS CH
1.0000 | ORAL_TABLET | Freq: Every day | ORAL | Status: DC
  Administered 2017-08-09 – 2017-08-11 (×3): 1 via ORAL
  Filled 2017-08-09 (×3): qty 1

## 2017-08-09 MED ORDER — FLUTICASONE PROPIONATE 50 MCG/ACT NA SUSP
1.0000 | Freq: Two times a day (BID) | NASAL | Status: DC
  Administered 2017-08-09 – 2017-08-11 (×5): 1 via NASAL
  Filled 2017-08-09: qty 16

## 2017-08-09 MED ORDER — WARFARIN SODIUM 5 MG PO TABS
7.5000 mg | ORAL_TABLET | Freq: Once | ORAL | Status: AC
  Administered 2017-08-09: 7.5 mg via ORAL
  Filled 2017-08-09: qty 1

## 2017-08-09 MED ORDER — VERAPAMIL HCL 80 MG PO TABS
160.0000 mg | ORAL_TABLET | Freq: Two times a day (BID) | ORAL | Status: DC
  Administered 2017-08-09 – 2017-08-11 (×5): 160 mg via ORAL
  Filled 2017-08-09 (×5): qty 2

## 2017-08-09 NOTE — Evaluation (Signed)
Physical Therapy Evaluation Patient Details Name: [name] MRN: [id number] DOB: 1955/07/31 Today's Date: 08/09/2017   History of Present Illness  Pt is a 62 year old female s/p ROBOTIC SIGMOID COLECTOMY ERAS PATHWAY due to recurrent sigmoid diverticulitis  Clinical Impression  Pt admitted with above diagnosis. Pt currently with functional limitations due to the deficits listed below (see PT Problem List).  Pt will benefit from skilled PT to increase their independence and safety with mobility to allow discharge to the venue listed below.  Pt assisted with ambulating in hallway and little unsteady due to reported dizziness (see mobility section below).  Anticipate pt will progress well with no home needs.     Follow Up Recommendations No PT follow up    Equipment Recommendations  None recommended by PT    Recommendations for Other Services       Precautions / Restrictions Precautions Precautions: Fall Restrictions Weight Bearing Restrictions: No      Mobility  Bed Mobility Overal bed mobility: Needs Assistance Bed Mobility: Sidelying to Sit;Sit to Sidelying   Sidelying to sit: Supervision   Sit to supine: Supervision   General bed mobility comments: increased time and effort  Transfers Overall transfer level: Needs assistance Equipment used: None Transfers: Sit to/from Stand Sit to Stand: Min guard            Ambulation/Gait Ambulation/Gait assistance: Min guard Ambulation Distance (Feet): 60 Feet Assistive device: None Gait Pattern/deviations: Step-through pattern;Decreased stride length     General Gait Details: 15'x1, 60'x1, 45'x1, seated rest breaks due to "dizziness" which did not become worse (pt believes from not sleeping well last night) BP 143/76 mmHg HR 108 bpm upon return to sitting on bed  Stairs            Wheelchair Mobility    Modified Rankin (Stroke Patients Only)       Balance Overall balance assessment: Mild deficits  observed, not formally tested(likely due to "dizziness" sensation, pt self corrected with hand rail)                                           Pertinent Vitals/Pain Pain Assessment: Faces Faces Pain Scale: Hurts little more Pain Location: abdomen with mobility Pain Descriptors / Indicators: Grimacing;Operative site guarding Pain Intervention(s): Monitored during session;Limited activity within patient's tolerance;Repositioned    Home Living Family/patient expects to be discharged to:: Private residence Living Arrangements: Spouse/significant other Available Help at Discharge: Family Type of Home: House       Home Layout: Able to live on main level with bedroom/bathroom Home Equipment: Kasandra Knudsen - single point      Prior Function Level of Independence: Independent               Hand Dominance        Extremity/Trunk Assessment        Lower Extremity Assessment Lower Extremity Assessment: Overall WFL for tasks assessed       Communication   Communication: No difficulties  Cognition Arousal/Alertness: Awake/alert Behavior During Therapy: WFL for tasks assessed/performed Overall Cognitive Status: Within Functional Limits for tasks assessed                                        General Comments      Exercises  Assessment/Plan    PT Assessment Patient needs continued PT services  PT Problem List Decreased mobility;Decreased balance;Decreased knowledge of use of DME;Decreased activity tolerance       PT Treatment Interventions DME instruction;Therapeutic activities;Gait training;Patient/family education;Therapeutic exercise;Functional mobility training;Stair training;Balance training    PT Goals (Current goals can be found in the Care Plan section)  Acute Rehab PT Goals PT Goal Formulation: With patient Time For Goal Achievement: 08/16/17 Potential to Achieve Goals: Good    Frequency Min 3X/week   Barriers to  discharge        Co-evaluation               AM-PAC PT "6 Clicks" Daily Activity  Outcome Measure Difficulty turning over in bed (including adjusting bedclothes, sheets and blankets)?: A Little Difficulty moving from lying on back to sitting on the side of the bed? : A Little Difficulty sitting down on and standing up from a chair with arms (e.g., wheelchair, bedside commode, etc,.)?: A Little Help needed moving to and from a bed to chair (including a wheelchair)?: A Little Help needed walking in hospital room?: A Little Help needed climbing 3-5 steps with a railing? : A Little 6 Click Score: 18    End of Session   Activity Tolerance: Patient tolerated treatment well Patient left: in bed;with call bell/phone within reach   PT Visit Diagnosis: Difficulty in walking, not elsewhere classified (R26.2)    Time: 9622-2979 PT Time Calculation (min) (ACUTE ONLY): 12 min   Charges:   PT Evaluation $PT Eval Low Complexity: 1 Low     PT G CodesCarmelia Bake, PT, DPT 08/09/2017 Pager: [phone]  [name] 08/09/2017, 12:26 PM

## 2017-08-09 NOTE — Progress Notes (Signed)
ANTICOAGULATION CONSULT NOTE -  Pharmacy Consult for LMWH bridge and coumadin Indication: atrial fibrillation and hx PE/DVTs  Allergies  Allergen Reactions  . Aspirin Other (See Comments)    Makes asthma worse and makes stomach hurt  . Ivp Dye [Iodinated Diagnostic Agents] Anaphylaxis    Required epinephrine. Since then, has tolerated with premed.  . Latex Other (See Comments) and Cough    Wheezing/cough   . Meperidine Hcl Other (See Comments)    Projectile vomiting   . Penicillins Anaphylaxis    Has patient had a PCN reaction causing immediate rash, facial/tongue/throat swelling, SOB or lightheadedness with hypotension:Yes Has patient had a PCN reaction causing severe rash involving mucus membranes or skin necrosis:Yes Has patient had a PCN reaction that required hospitalization:Treated in ER w/epi & breathing treatments Has patient had a PCN reaction occurring within the last 10 years:No If all of the above answers are "NO", then may proceed with Cephalosporin use.   . Statins Other (See Comments)    Severe leg pain - has tried most of them  . Sulfonamide Derivatives Anaphylaxis  . Banana Swelling and Cough    Inside of the mouth swells  . Food Other (See Comments) and Cough    Nuts- mucus membranes inflamed/cough   . Peanut-Containing Drug Products Swelling and Cough    Inside of the mouth swells  . Tape Hives and Dermatitis    No plastic tape!!    Patient Measurements: Height: 5\' 5"  (165.1 cm) Weight: 233 lb 3.2 oz (105.8 kg) IBW/kg (Calculated) : 57   Vital Signs: Temp: 100.5 F (38.1 C) (05/19 2219) Temp Source: Oral (05/19 2219) BP: 149/80 (05/19 2025) Pulse Rate: 108 (05/19 2025)  Labs: Recent Labs    08/07/17 0447 08/08/17 0417 08/09/17 0437  HGB 9.6* 9.5* 10.9*  HCT 29.8* 30.5* 33.7*  PLT 343 348 380  LABPROT 14.7 14.1 13.6  INR 1.16 1.10 1.05  CREATININE 0.71 0.66  --     Estimated Creatinine Clearance: 86.9 mL/min (by C-G formula based on  SCr of 0.66 mg/dL).  Assessment: 62 yo F s/p XI ROBOTIC SIGMOID COLECTOMY.  She was on coumadin PTA for afib and PMH PE/DVTs.  PTA she was on LMWH bridge 100 mg sq q12h.  Home coumadin dose: 6 mg qd x 4 mg MWF.   08/09/2017 INR 1.05 H/H low but stable Plts WNL No s/s of bleeding  Goal of Therapy:  INR 2-3 Monitor platelets by anticoagulation protocol: Yes   Plan:  Continue LMWH 100 q12  (give in THIGH per Dr Johney Maine) Coumadin 7.5 mg po x 1 dose today Daily INR  Dolly Rias RPh 08/09/2017, 7:28 AM Pager [phone]

## 2017-08-09 NOTE — Progress Notes (Signed)
[place]  [address] [place], [place] [address] Phone: [phone]  FAX: [place] [phone] [id number]  CARE TEAM:  PCP: Antony Contras, MD  Outpatient Care Team: Patient Care Team: Antony Contras, MD as PCP - General (Family Medicine) Thompson Grayer, MD as PCP - Cardiology (Cardiology) Michael Boston, MD as Consulting Physician (General Surgery) Armbruster, Carlota Raspberry, MD as Consulting Physician (Gastroenterology) Serena Colonel, RN as [place] Management  Inpatient Treatment Team: Treatment Team: Attending Provider: Michael Boston, MD; Registered Nurse: Heloise Ochoa, RN; Registered Nurse: Vella Redhead, RN   Problem List:   Principal Problem:   Recurrent diverticulitis s/p robotic sigmoid colectomy 08/06/2017 Active Problems:   GERD   History of DVT (deep vein thrombosis)   Essential hypertension   Sleep apnea   Chronic anticoagulation (warfarin for Afib)   3 Days Post-Op  08/06/2017  POST-OPERATIVE DIAGNOSIS: Recurrent sigmoid diverticulitis  PROCEDURE:   XI ROBOTIC SIGMOID COLECTOMY RIGID PROCTOSCOPY  SURGEON:  Adin Hector, MD    Assessment  OK  Plan:  -bowel regimen to help normalize BMs - challenge for her in past. -?temp spike but WBC better & denies n/v now.   -control GERD -f/u pathology -HTN control -VTE prophylaxis- SCDs, etc -mobilize as tolerated to help recovery  20 minutes spent in review, evaluation, examination, counseling, and coordination of care.  More than 50% of that time was spent in counseling.  Adin Hector, M.D., F.A.C.S. Gastrointestinal and Minimally Invasive Surgery [facility] [address], [place], [place] [address] [phone] Main / Paging   08/09/2017    Subjective: (Chief complaint)  Many loose BMs this AM - 1st BMs C/o GERD Temp spike yest N/V x1 yesterday -  OK now  Objective:  Vital signs:  Vitals:   08/08/17 1440 08/08/17 2025 08/08/17 2219 08/09/17 0500  BP: (!) 151/73 (!) 149/80    Pulse: (!) 107 (!) 108    Resp: 17 16    Temp: 99.7 F (37.6 C) (!) 102 F (38.9 C) (!) 100.5 F (38.1 C)   TempSrc: Oral Oral Oral   SpO2: 94% 93%    Weight:    105.8 kg (233 lb 3.2 oz)  Height:        Last BM Date: 08/06/17  Intake/Output   Yesterday:  05/19 0701 - 05/20 0700 In: 840 [P.O.:840] Out: 1100 [Urine:1100] This shift:  No intake/output data recorded.  Bowel function:  Flatus: YES  BM:  YES  Drain: (No drain)   Physical Exam:  General: Pt awake/alert/oriented x4 in no acute distress Eyes: PERRL, normal EOM.  Sclera clear.  No icterus Neuro: CN II-XII intact w/o focal sensory/motor deficits. Lymph: No head/neck/groin lymphadenopathy Psych:  No delerium/psychosis/paranoia HENT: Normocephalic, Mucus membranes moist.  No thrush Neck: Supple, No tracheal deviation Chest: No chest wall pain w good excursion CV:  Pulses intact.  Regular rhythm MS: Normal AROM mjr joints.  No obvious deformity  Abdomen: Soft.  Nondistended.  Nontender.  No evidence of peritonitis.  Dressings removed.  No incarcerated hernias.  Ext:   No deformity.  No mjr edema.  No cyanosis Skin: No petechiae / purpura  Results:   Labs: Results for orders placed or performed during the hospital encounter of 08/06/17 (from the past 48 hour(s))  Protime-INR     Status: None   Collection Time: 08/08/17  4:17 AM  Result Value  Ref Range   Prothrombin Time 14.1 11.4 - 15.2 seconds   INR 1.10     Comment: Performed at [facility], [place] [address] [place], [place] [address]  CBC     Status: Abnormal   Collection Time: 08/08/17  4:17 AM  Result Value Ref Range   WBC 12.8 (H) 4.0 - 10.5 K/uL   RBC 3.41 (L) 3.87 - 5.11 MIL/uL   Hemoglobin 9.5 (L) 12.0 - 15.0 g/dL   HCT 30.5 (L) 36.0 - 46.0 %   MCV 89.4 78.0 - 100.0 fL   MCH 27.9 26.0 -  34.0 pg   MCHC 31.1 30.0 - 36.0 g/dL   RDW 14.4 11.5 - 15.5 %   Platelets 348 150 - 400 K/uL    Comment: Performed at [facility], [place] [address] [place], [place] [address]  Comprehensive metabolic panel     Status: Abnormal   Collection Time: 08/08/17  4:17 AM  Result Value Ref Range   Sodium 138 135 - 145 mmol/L   Potassium 3.9 3.5 - 5.1 mmol/L   Chloride 102 101 - 111 mmol/L   CO2 23 22 - 32 mmol/L   Glucose, Bld 112 (H) 65 - 99 mg/dL   BUN 8 6 - 20 mg/dL   Creatinine, Ser 0.66 0.44 - 1.00 mg/dL   Calcium 9.2 8.9 - 10.3 mg/dL   Total Protein 6.5 6.5 - 8.1 g/dL   Albumin 3.2 (L) 3.5 - 5.0 g/dL   AST 23 15 - 41 U/L   ALT 23 14 - 54 U/L   Alkaline Phosphatase 55 38 - 126 U/L   Total Bilirubin 0.2 (L) 0.3 - 1.2 mg/dL   GFR calc non Af Amer >60 >60 mL/min   GFR calc Af Amer >60 >60 mL/min    Comment: (NOTE) The eGFR has been calculated using the CKD EPI equation. This calculation has not been validated in all clinical situations. eGFR's persistently <60 mL/min signify possible Chronic Kidney Disease.    Anion gap 13 5 - 15    Comment: Performed at [facility], [place] [address], [place], [place] [address]  Protime-INR     Status: None   Collection Time: 08/09/17  4:37 AM  Result Value Ref Range   Prothrombin Time 13.6 11.4 - 15.2 seconds   INR 1.05     Comment: Performed at [facility], [place] [address], [place], [place] [address]  CBC     Status: Abnormal   Collection Time: 08/09/17  4:37 AM  Result Value Ref Range   WBC 11.7 (H) 4.0 - 10.5 K/uL   RBC 3.80 (L) 3.87 - 5.11 MIL/uL   Hemoglobin 10.9 (L) 12.0 - 15.0 g/dL   HCT 33.7 (L) 36.0 - 46.0 %   MCV 88.7 78.0 - 100.0 fL   MCH 28.7 26.0 - 34.0 pg   MCHC 32.3 30.0 - 36.0 g/dL   RDW 14.2 11.5 - 15.5 %   Platelets 380 150 - 400 K/uL    Comment: Performed at [facility], [place] [address], [place], [place] [address]    Imaging / Studies: No  results found.  Medications / Allergies: per chart  Antibiotics: Anti-infectives (From admission, onward)   Start     Dose/Rate Route Frequency Ordered Stop   08/06/17 1600  clindamycin (CLEOCIN) IVPB 900 mg     900 mg 100 mL/hr over 30 Minutes Intravenous Every 8 hours 08/06/17 1402 08/06/17 1640  08/06/17 1400  neomycin (MYCIFRADIN) tablet 1,000 mg  Status:  Discontinued     1,000 mg Oral 3 times per day 08/06/17 0541 08/06/17 0553   08/06/17 1400  metroNIDAZOLE (FLAGYL) tablet 1,000 mg  Status:  Discontinued     1,000 mg Oral 3 times per day 08/06/17 0541 08/06/17 0553   08/06/17 0955  clindamycin (CLEOCIN) 900 mg, gentamicin (GARAMYCIN) 240 mg in sodium chloride 0.9 % 1,000 mL for intraperitoneal lavage  Status:  Discontinued       As needed 08/06/17 1010 08/06/17 1015   08/06/17 0600  gentamicin (GARAMYCIN) 380 mg in dextrose 5 % 100 mL IVPB     5 mg/kg  76 kg (Adjusted) 109.5 mL/hr over 60 Minutes Intravenous 60 min pre-op 08/05/17 1251 08/06/17 0755   08/06/17 0600  clindamycin (CLEOCIN) 900 mg, gentamicin (GARAMYCIN) 240 mg in sodium chloride 0.9 % 1,000 mL for intraperitoneal lavage  Status:  Discontinued      Intraperitoneal To Surgery 08/05/17 1251 08/06/17 1339   08/05/17 1251  clindamycin (CLEOCIN) IVPB 900 mg     900 mg 100 mL/hr over 30 Minutes Intravenous 60 min pre-op 08/05/17 1251 08/06/17 0739        Note: Portions of this report may have been transcribed using voice recognition software. Every effort was made to ensure accuracy; however, inadvertent computerized transcription errors may be present.   Any transcriptional errors that result from this process are unintentional.     Adin Hector, M.D., F.A.C.S. Gastrointestinal and Minimally Invasive Surgery [facility] [address], [place], [place] [address] [phone] Main / Paging   08/09/2017

## 2017-08-09 NOTE — Progress Notes (Signed)
Pharmacy Brief Note - Alvimopan (Entereg)  The standing order set for alvimopan (Entereg) now includes an automatic order to discontinue the drug after the patient has had a bowel movement. The change was approved by the [place] and the Medical Executive Committee.   This patient has had bowel movements documented by nursing. Therefore, alvimopan has been discontinued. If there are questions, please contact the pharmacy at [phone].   Thank you-  Dolly Rias RPh 08/09/2017, 9:41 AM Pager [phone]

## 2017-08-09 NOTE — Progress Notes (Signed)
OT Cancellation Note  Patient Details Name: [name] MRN: [id number] DOB: 1955/08/19   Cancelled Treatment:     Pt not seen this date. Pt refused, reports feeling nauseous and not wanting to leave bed this PM. Nurse aware, in room giving meds. Will continue to follow as available and appropriate.  Zenovia Jarred, MSOT, OTR/L   [place] 08/09/2017, 4:38 PM

## 2017-08-09 NOTE — Progress Notes (Signed)
MD called as output was unknown as pt stated she voided with diarrhea, so called to clarify that it was ok to give 1 Liter bolus of LR.

## 2017-08-10 ENCOUNTER — Encounter (HOSPITAL_COMMUNITY): Payer: Self-pay | Admitting: Surgery

## 2017-08-10 LAB — CBC
HEMATOCRIT: 29 % — AB (ref 36.0–46.0)
Hemoglobin: 9.1 g/dL — ABNORMAL LOW (ref 12.0–15.0)
MCH: 27.7 pg (ref 26.0–34.0)
MCHC: 31.4 g/dL (ref 30.0–36.0)
MCV: 88.4 fL (ref 78.0–100.0)
Platelets: 387 10*3/uL (ref 150–400)
RBC: 3.28 MIL/uL — ABNORMAL LOW (ref 3.87–5.11)
RDW: 14 % (ref 11.5–15.5)
WBC: 8.1 10*3/uL (ref 4.0–10.5)

## 2017-08-10 LAB — MAGNESIUM: Magnesium: 1.6 mg/dL — ABNORMAL LOW (ref 1.7–2.4)

## 2017-08-10 LAB — PROTIME-INR
INR: 1.29
Prothrombin Time: 15.9 seconds — ABNORMAL HIGH (ref 11.4–15.2)

## 2017-08-10 LAB — CREATININE, SERUM
CREATININE: 0.58 mg/dL (ref 0.44–1.00)
GFR calc Af Amer: >60 mL/min (ref >60)

## 2017-08-10 LAB — POTASSIUM: Potassium: 3.3 mmol/L — ABNORMAL LOW (ref 3.5–5.1)

## 2017-08-10 MED ORDER — LOPERAMIDE HCL 2 MG PO CAPS
2.0000 mg | ORAL_CAPSULE | Freq: Three times a day (TID) | ORAL | Status: AC | PRN
  Administered 2017-08-10 – 2017-08-11 (×2): 2 mg via ORAL
  Filled 2017-08-10 (×2): qty 1

## 2017-08-10 MED ORDER — WARFARIN SODIUM 6 MG PO TABS
6.0000 mg | ORAL_TABLET | Freq: Once | ORAL | Status: AC
  Administered 2017-08-10: 6 mg via ORAL
  Filled 2017-08-10: qty 1

## 2017-08-10 MED ORDER — WARFARIN SODIUM 5 MG PO TABS: 5.0000 mg | ORAL_TABLET | Freq: Once | ORAL | Status: DC

## 2017-08-10 MED ORDER — CALCIUM POLYCARBOPHIL 625 MG PO TABS
625.0000 mg | ORAL_TABLET | Freq: Every day | ORAL | Status: DC
  Administered 2017-08-10 – 2017-08-11 (×2): 625 mg via ORAL
  Filled 2017-08-10 (×3): qty 1

## 2017-08-10 MED ORDER — GABAPENTIN 300 MG PO CAPS
300.0000 mg | ORAL_CAPSULE | Freq: Every day | ORAL | Status: DC
  Administered 2017-08-10: 300 mg via ORAL
  Filled 2017-08-10: qty 1

## 2017-08-10 MED ORDER — POTASSIUM CHLORIDE CRYS ER 20 MEQ PO TBCR
40.0000 meq | EXTENDED_RELEASE_TABLET | Freq: Every day | ORAL | Status: DC
  Administered 2017-08-10 – 2017-08-11 (×2): 40 meq via ORAL
  Filled 2017-08-10 (×2): qty 2

## 2017-08-10 MED ORDER — MAGNESIUM SULFATE 2 GM/50ML IV SOLN
2.0000 g | Freq: Once | INTRAVENOUS | Status: AC
  Administered 2017-08-10: 2 g via INTRAVENOUS
  Filled 2017-08-10: qty 50

## 2017-08-10 NOTE — Progress Notes (Addendum)
[address] [place], [place] [address] Phone: [phone]  FAX: [phone]      VERSIE SOAVE [id number] [birth year]  CARE TEAM:  PCP: Antony Contras, MD  Outpatient Care Team: Patient Care Team: Antony Contras, MD as PCP - General (Family Medicine) Thompson Grayer, MD as PCP - Cardiology (Cardiology) Michael Boston, MD as Consulting Physician (General Surgery) Armbruster, Carlota Raspberry, MD as Consulting Physician (Gastroenterology) Serena Colonel, RN as [place] Management  Inpatient Treatment Team: Treatment Team: Attending Provider: Michael Boston, MD; Registered Nurse: Heloise Ochoa, RN; Technician: Leda Quail, NT; Technician: Denyse Dago, NT; Occupational Therapist: Lesle Chris, OT; Registered Nurse: Vella Redhead, RN   Problem List:   Principal Problem:   Recurrent diverticulitis s/p robotic sigmoid colectomy 08/06/2017 Active Problems:   GERD   History of DVT (deep vein thrombosis)   Essential hypertension   Obesity (BMI 30-39.9)   Sleep apnea   Chronic anticoagulation (warfarin for Afib)   4 Days Post-Op  08/06/2017  POST-OPERATIVE DIAGNOSIS: Recurrent sigmoid diverticulitis  PROCEDURE:   XI ROBOTIC SIGMOID COLECTOMY RIGID PROCTOSCOPY  SURGEON:  Adin Hector, MD    Assessment  OK but better  Plan:  -bowel regimen to help normalize BMs - challenge for her in past.  Try fibercon tab (didn't want psyllium).  Bentyl helped.  Imodium for breakthrough if severe (hopefully not too likely)  -retry PO - normally tolerates solids.  See if nutrition can find PO supplements she can tolerate to help.  ?IBS diet recs?  -control GERD -pathology c/w diverticulosis/itis = benign - d/w patient -Correct low K & Mg -warfarin - reloading -HTN control -Effexor for depression -VTE prophylaxis- SCDs, etc -mobilize as tolerated to help recovery - cleared by PT/OT  but not walking much.  Claims daughter available for qa few more days, then it is just her husband (she tends to care for him) and neighbors.  D/C patient from hospital when patient meets criteria (anticipate in 1-2 day(s)):  Tolerating oral intake well Ambulating well Adequate pain control without IV medications Urinating  Having flatus Disposition planning in place   20 minutes spent in review, evaluation, examination, counseling, and coordination of care.  More than 50% of that time was spent in counseling.  D/w RN  Adin Hector, M.D., F.A.C.S. Gastrointestinal and Minimally Invasive Surgery [facility] [address] [place], [place] [address] [phone] Main / Paging   08/10/2017    Subjective: (Chief complaint)  Loose BMs - less w bentyl IVF bolus given Tired Walked in hallway partially - cleared by PT/OT  Objective:  Vital signs:  Vitals:   08/09/17 2011 08/09/17 2021 08/09/17 2146 08/10/17 0431  BP:  (!) 143/52 (!) 136/56 (!) 124/51  Pulse:  86  80  Resp:  16  16  Temp:  98 F (36.7 C)  98.7 F (37.1 C)  TempSrc:  Oral  Oral  SpO2: 93% 99%  93%  Weight:    104.3 kg (229 lb 15 oz)  Height:        Last BM Date: 08/09/17  Intake/Output   Yesterday:  05/20 0701 - 05/21 0700 In: 1180 [P.O.:180; IV Piggyback:1000] Out: 800 [Urine:800] This shift:  No intake/output data recorded.  Bowel function:  Flatus: YES  BM:  YES - loose  Drain: (No drain)   Physical Exam:  General: Pt awake/alert/oriented x4 in no acute distress.  Tired not toxic  Eyes: PERRL, normal EOM.  Sclera clear.  No icterus Neuro: CN II-XII intact w/o focal sensory/motor deficits. Lymph: No head/neck/groin lymphadenopathy Psych:  No delerium/psychosis/paranoia HENT: Normocephalic, Mucus membranes moist.  No thrush Neck: Supple, No tracheal deviation Chest: No chest wall pain w good excursion CV:  Pulses intact.  Regular rhythm MS:  Normal AROM mjr joints.  No obvious deformity  Abdomen: Obese.  Soft.  Nondistended.  Nontender.  No evidence of peritonitis.  Incisions closed.  No incarcerated hernias.  Ext:   No deformity.  No mjr edema.  No cyanosis Skin: No petechiae / purpura  Results:   PATHOLOGY Diagnosis 1. Colon, segmental resection, rectosigmoid - DIVERTICULAR DISEASE. - ONE BENIGN LYMPH NODE (0/1). - THE SURGICAL RESECTION MARGINS ARE HISTOLOGICALLY VIABLE. - THERE IS NO EVIDENCE OF MALIGNANCY.  2. Colon, resection margin (donut), distal anastomotic - BENIGN COLORECTAL TYPE MUCOSA. - THERE IS NO EVIDENCE OF MALIGNANCY. Enid Cutter MD Pathologist, Electronic Signature (Case signed 08/09/2017) Specimen Roslin [place] and Clinical Information Specimen(s) Obtained: 1. Colon, segmental resection, rectosigmoid 2. Colon, resection margin (donut), distal anastomotic Specimen Clinical Information 1. recurrent sigmoid diverticulitis [rd] Burnis Kaser 1. Specimen: Sigmoid colon and proximal third of rectum. Integrity: Proximal end received opened, distal end received stapled. Length: Sigmoid colon is 15 cm in length. The portion of rectum is 3.5 cm in length. Serosa: Smooth tan-pink, with abundant pericolonic adipose tissue. Contents: Minimal bright green soft contents. Mucosa/Wall: The wall is diffusely 0.3 cm. The mucosa is tan-pink with markedly redundant folding. Discrete mass or lesion is not seen. The lumen is diffusely narrowed to 1.8 cm in circumference. There are innumerable diverticula present, partially filled with bright green fecal material. Perforation is not seen. Diverticula ranges from 0.5 up to 2 cm. Lymph nodes: A single 0.2 cm possible lymph node is sampled. 1 of 2 FINAL for FYNLEY, CHRYSTAL L ([id number]) Mallarie Voorhies(continued) Block Summary: A = proximal margin en face B = distal margin en face C = largest diverticulum D = additional diverticula with one lymph node whole. 2. Received in formalin is a  donut-shaped 1.8 x 1 x 0.3 cm portion of tan-pink mucosa, with an eccentric staple line. The staple is removed and the specimen is entirely submitted in one cassette. (AK:ah 08/06/17) Report signed out from the following location(s) Technical component and interpretation was performed at [facility] [place], [place], [place] [address]. CLIA #: [id number], 2 of  Labs: Results for orders placed or performed during the hospital encounter of 08/06/17 (from the past 48 hour(s))  Protime-INR     Status: None   Collection Time: 08/09/17  4:37 AM  Result Value Ref Range   Prothrombin Time 13.6 11.4 - 15.2 seconds   INR 1.05     Comment: Performed at [facility], [place] [address] [place], [place] [address]  CBC     Status: Abnormal   Collection Time: 08/09/17  4:37 AM  Result Value Ref Range   WBC 11.7 (H) 4.0 - 10.5 K/uL   RBC 3.80 (L) 3.87 - 5.11 MIL/uL   Hemoglobin 10.9 (L) 12.0 - 15.0 g/dL   HCT 33.7 (L) 36.0 - 46.0 %   MCV 88.7 78.0 - 100.0 fL   MCH 28.7 26.0 - 34.0 pg   MCHC 32.3 30.0 - 36.0 g/dL   RDW 14.2 11.5 - 15.5 %   Platelets 380 150 - 400 K/uL    Comment: Performed at [facility], [place] [address] [place], [place]  [address]  Creatinine, serum     Status: None   Collection Time: 08/10/17 12:33 AM  Result Value Ref Range   Creatinine, Ser 0.58 0.44 - 1.00 mg/dL   GFR calc non Af Amer >60 >60 mL/min   GFR calc Af Amer >60 >60 mL/min    Comment: (NOTE) The eGFR has been calculated using the CKD EPI equation. This calculation has not been validated in all clinical situations. eGFR's persistently <60 mL/min signify possible Chronic Kidney Disease. Performed at [facility], [place] [address], [place], [place] [address]   Protime-INR     Status: Abnormal   Collection Time: 08/10/17  4:22 AM  Result Value Ref Range   Prothrombin Time 15.9 (H) 11.4 - 15.2 seconds   INR 1.29     Comment: Performed  at [facility], [place] [address] [place], [place] [address]  CBC     Status: Abnormal   Collection Time: 08/10/17  4:22 AM  Result Value Ref Range   WBC 8.1 4.0 - 10.5 K/uL   RBC 3.28 (L) 3.87 - 5.11 MIL/uL   Hemoglobin 9.1 (L) 12.0 - 15.0 g/dL   HCT 29.0 (L) 36.0 - 46.0 %   MCV 88.4 78.0 - 100.0 fL   MCH 27.7 26.0 - 34.0 pg   MCHC 31.4 30.0 - 36.0 g/dL   RDW 14.0 11.5 - 15.5 %   Platelets 387 150 - 400 K/uL    Comment: Performed at [facility], [place] [address] [place], [place] [address]  Magnesium     Status: Abnormal   Collection Time: 08/10/17  4:22 AM  Result Value Ref Range   Magnesium 1.6 (L) 1.7 - 2.4 mg/dL    Comment: Performed at [facility], [place] [address], [place], [place] [address]  Potassium     Status: Abnormal   Collection Time: 08/10/17  4:22 AM  Result Value Ref Range   Potassium 3.3 (L) 3.5 - 5.1 mmol/L    Comment: Performed at [facility], [place] [address], [place], [place] [address]    Imaging / Studies: No results found.  Medications / Allergies: per chart  Antibiotics: Anti-infectives (From admission, onward)   Start     Dose/Rate Route Frequency Ordered Stop   08/06/17 1600  clindamycin (CLEOCIN) IVPB 900 mg     900 mg 100 mL/hr over 30 Minutes Intravenous Every 8 hours 08/06/17 1402 08/06/17 1640   08/06/17 1400  neomycin (MYCIFRADIN) tablet 1,000 mg  Status:  Discontinued     1,000 mg Oral 3 times per day 08/06/17 0541 08/06/17 0553   08/06/17 1400  metroNIDAZOLE (FLAGYL) tablet 1,000 mg  Status:  Discontinued     1,000 mg Oral 3 times per day 08/06/17 0541 08/06/17 0553   08/06/17 0955  clindamycin (CLEOCIN) 900 mg, gentamicin (GARAMYCIN) 240 mg in sodium chloride 0.9 % 1,000 mL for intraperitoneal lavage  Status:  Discontinued       As needed 08/06/17 1010 08/06/17 1015   08/06/17 0600  gentamicin (GARAMYCIN) 380 mg in dextrose 5 % 100 mL IVPB     5 mg/kg  76 kg  (Adjusted) 109.5 mL/hr over 60 Minutes Intravenous 60 min pre-op 08/05/17 1251 08/06/17 0755   08/06/17 0600  clindamycin (CLEOCIN) 900 mg, gentamicin (GARAMYCIN) 240 mg in sodium chloride 0.9 % 1,000 mL for intraperitoneal lavage  Status:  Discontinued      Intraperitoneal To Surgery 08/05/17 1251 08/06/17 1339   08/05/17  1251  clindamycin (CLEOCIN) IVPB 900 mg     900 mg 100 mL/hr over 30 Minutes Intravenous 60 min pre-op 08/05/17 1251 08/06/17 0739        Note: Portions of this report may have been transcribed using voice recognition software. Every effort was made to ensure accuracy; however, inadvertent computerized transcription errors may be present.   Any transcriptional errors that result from this process are unintentional.     Adin Hector, M.D., F.A.C.S. Gastrointestinal and Minimally Invasive Surgery [facility] [address], [place], [place] [address] [phone] Main / Paging   08/10/2017

## 2017-08-10 NOTE — Plan of Care (Signed)
Plan of care discussed with patient 

## 2017-08-10 NOTE — Consult Note (Signed)
   [facility] CM Inpatient Consult   0/09/6224  [place] 3/33/5456 [phone]   Went to bedside to speak with Mrs. Tuohy on behalf of [employer] program for [employer] employees/dependents with [facility] insurance.   Mrs. Califano denies having any [facility] Care Management needs at this time. Provided 24-nurse advice line magnet. Discussed [facility] Care Management Telephonic RNCM will follow up post discharge.  Expressed appreciation of visit.    Marthenia Rolling, MSN-Ed, RN,BSN [facility] Liaison [phone]

## 2017-08-10 NOTE — Evaluation (Signed)
Occupational Therapy Evaluation Patient Details Name: [name] MRN: [id number] DOB: [birth year] Today's Date: 08/10/2017    History of Present Illness Pt is a 62 year old female s/p ROBOTIC SIGMOID COLECTOMY ERAS PATHWAY due to recurrent sigmoid diverticulitis   Clinical Impression   Pt was admitted for the above sx. All education was completed. No further OT is needed at this time    Follow Up Recommendations  Supervision/Assistance - 24 hour    Equipment Recommendations  None recommended by OT    Recommendations for Other Services       Precautions / Restrictions Precautions Precautions: Fall Restrictions Weight Bearing Restrictions: No      Mobility Bed Mobility Overal bed mobility: Modified Independent             General bed mobility comments: rolling, sidelying, HOB raised  Transfers   Equipment used: None   Sit to Stand: Supervision              Balance                                           ADL either performed or assessed with clinical judgement   ADL Overall ADL's : Needs assistance/impaired                         Toilet Transfer: Min guard;Ambulation;Comfort height toilet   Toileting- Clothing Manipulation and Hygiene: Modified independent;Sitting/lateral lean         General ADL Comments: pt needs min A for LB adls; set up for UB. She was wanting to walk in hall, but got nauseous after getting up to bathroom.  BP 141/56. Had also c/o lightheadedness. Pt had falled 6' from a ladder recently and hit head.  Lightheadedness present before but increased after this.  Educated on Furniture conservator/restorer      Pertinent Vitals/Pain Pain Assessment: Faces Pain Score: 2  Faces Pain Scale: Hurts a little bit Pain Location: abdomen Pain Descriptors / Indicators: Sore Pain Intervention(s): Limited activity within patient's tolerance;Monitored during  session;Premedicated before session;Repositioned     Hand Dominance     Extremity/Trunk Assessment Upper Extremity Assessment Upper Extremity Assessment: Overall WFL for tasks assessed           Communication Communication Communication: No difficulties   Cognition Arousal/Alertness: Awake/alert Behavior During Therapy: WFL for tasks assessed/performed Overall Cognitive Status: Within Functional Limits for tasks assessed                                     General Comments       Exercises     Shoulder Instructions      Home Living Family/patient expects to be discharged to:: Private residence Living Arrangements: Spouse/significant other Available Help at Discharge: Family               Bathroom Shower/Tub: Walk-in Psychologist, prison and probation services: Standard     Home Equipment: Cane - single point;Shower seat - built in          Prior Functioning/Environment Level of Independence: Independent                 OT  Problem List:        OT Treatment/Interventions:      OT Goals(Current goals can be found in the care plan section) Acute Rehab OT Goals Patient Stated Goal: none stated OT Goal Formulation: All assessment and education complete, DC therapy  OT Frequency:     Barriers to D/C:            Co-evaluation              AM-PAC PT "6 Clicks" Daily Activity     Outcome Measure Help from another person eating meals?: None Help from another person taking care of personal grooming?: A Little Help from another person toileting, which includes using toliet, bedpan, or urinal?: A Little Help from another person bathing (including washing, rinsing, drying)?: A Little Help from another person to put on and taking off regular upper body clothing?: A Little Help from another person to put on and taking off regular lower body clothing?: A Little 6 Click Score: 19   End of Session Nurse Communication: (nausea)  Activity Tolerance:  Other (comment)(lightheadedness and nausea) Patient left: in bed;with bed alarm set  OT Visit Diagnosis: Muscle weakness (generalized) (M62.81)                Time: 1165-7903 OT Time Calculation (min): 23 min Charges:  OT General Charges $OT Visit: 1 Visit OT Evaluation $OT Eval Low Complexity: 1 Low G-Codes:     [place], OTR/L [phone] 08/10/2017  Dayna Alia 08/10/2017, 11:32 AM

## 2017-08-10 NOTE — Progress Notes (Signed)
Patient status post robotic colectomy for recurrent sigmoid diverticulitis. Path benign. I told the pt the good news   Diagnosis 1. Colon, segmental resection, rectosigmoid - DIVERTICULAR DISEASE. - ONE BENIGN LYMPH NODE (0/1). - THE SURGICAL RESECTION MARGINS ARE HISTOLOGICALLY VIABLE. - THERE IS NO EVIDENCE OF MALIGNANCY. 2. Colon, resection margin (donut), distal anastomotic - BENIGN COLORECTAL TYPE MUCOSA. - THERE IS NO EVIDENCE OF MALIGNANCY. Enid Cutter MD

## 2017-08-10 NOTE — Plan of Care (Signed)
Nutrition Education Note  RD consulted for nutrition education regarding chronic diarrhea/IBS.  RD provided "Diarrhea Nutrition Therapy" handout from the Academy of [employer]. Reviewed patient's dietary recall. Provided examples on ways to decrease fiber intake in the diet such as avoiding whole grains, seeds, nuts, raw vegetables,  fruits with skin, and connective tissue of meats. Advised patient to avoid sugar, artificial sweeteners, acidic/spicy foods, and caffeine. Discussed with patient the importance of having adequate amounts of lean protein and avoiding saturated fats. Gave patient examples of meals and menu planning.   Teach back method used.  Expect good compliance. Pt with some food allergies which limits food options. Encouraged protein shakes  Body mass index is 38.26 kg/m. Pt meets criteria for obesity based on current BMI.  Current diet order is heart healthy, patient is consuming approximately 100% of meals at this time. Labs and medications reviewed. No further nutrition interventions warranted at this time. If additional nutrition issues arise, please re-consult RD.  Clayton Bibles, MS, RD, [place] Dietitian Pager: [phone] After Hours Pager: [phone]

## 2017-08-10 NOTE — Progress Notes (Signed)
ANTICOAGULATION CONSULT NOTE -  Pharmacy Consult for LMWH bridge and coumadin Indication: atrial fibrillation and hx PE/DVTs  Allergies  Allergen Reactions  . Aspirin Other (See Comments)    Makes asthma worse and makes stomach hurt  . Ivp Dye [Iodinated Diagnostic Agents] Anaphylaxis    Required epinephrine. Since then, has tolerated with premed.  . Latex Other (See Comments) and Cough    Wheezing/cough   . Meperidine Hcl Other (See Comments)    Projectile vomiting   . Penicillins Anaphylaxis    Has patient had a PCN reaction causing immediate rash, facial/tongue/throat swelling, SOB or lightheadedness with hypotension:Yes Has patient had a PCN reaction causing severe rash involving mucus membranes or skin necrosis:Yes Has patient had a PCN reaction that required hospitalization:Treated in ER w/epi & breathing treatments Has patient had a PCN reaction occurring within the last 10 years:No If all of the above answers are "NO", then may proceed with Cephalosporin use.   . Statins Other (See Comments)    Severe leg pain - has tried most of them  . Sulfonamide Derivatives Anaphylaxis  . Banana Swelling and Cough    Inside of the mouth swells  . Food Other (See Comments) and Cough    Nuts- mucus membranes inflamed/cough   . Peanut-Containing Drug Products Swelling and Cough    Inside of the mouth swells  . Tape Hives and Dermatitis    No plastic tape!!    Patient Measurements: Height: 5\' 5"  (165.1 cm) Weight: 229 lb 15 oz (104.3 kg) IBW/kg (Calculated) : 57   Vital Signs: Temp: 98.7 F (37.1 C) (05/21 0431) Temp Source: Oral (05/21 0431) BP: 124/51 (05/21 0431) Pulse Rate: 80 (05/21 0431)  Labs: Recent Labs    08/08/17 0417 08/09/17 0437 08/10/17 0033 08/10/17 0422  HGB 9.5* 10.9*  --  9.1*  HCT 30.5* 33.7*  --  29.0*  PLT 348 380  --  387  LABPROT 14.1 13.6  --  15.9*  INR 1.10 1.05  --  1.29  CREATININE 0.66  --  0.58  --     Estimated Creatinine  Clearance: 86.2 mL/min (by C-G formula based on SCr of 0.58 mg/dL).  Assessment: 62 yo F s/p XI ROBOTIC SIGMOID COLECTOMY.  She was on coumadin PTA for afib and PMH PE/DVTs.  PTA she was on LMWH bridge 100 mg sq q12h.  Home coumadin dose: 6 mg qd x 4 mg MWF.   08/10/2017 INR 1.29 H/H low but stable Plts WNL No s/s of bleeding  Goal of Therapy:  INR 2-3 Monitor platelets by anticoagulation protocol: Yes   Plan:  Continue LMWH 100 q12  (give in THIGH per Dr Johney Maine) Coumadin 6 mg po x 1 dose today Daily INR  Dolly Rias RPh 08/10/2017, 7:47 AM Pager [phone]

## 2017-08-11 LAB — CBC
HEMATOCRIT: 29.2 % — AB (ref 36.0–46.0)
HEMOGLOBIN: 9.3 g/dL — AB (ref 12.0–15.0)
MCH: 27.9 pg (ref 26.0–34.0)
MCHC: 31.8 g/dL (ref 30.0–36.0)
MCV: 87.7 fL (ref 78.0–100.0)
Platelets: 457 10*3/uL — ABNORMAL HIGH (ref 150–400)
RBC: 3.33 MIL/uL — AB (ref 3.87–5.11)
RDW: 14 % (ref 11.5–15.5)
WBC: 8.5 10*3/uL (ref 4.0–10.5)

## 2017-08-11 LAB — GLUCOSE, CAPILLARY: Glucose-Capillary: 176 mg/dL — ABNORMAL HIGH (ref 65–99)

## 2017-08-11 LAB — PROTIME-INR
INR: 1.58
PROTHROMBIN TIME: 18.7 s — AB (ref 11.4–15.2)

## 2017-08-11 MED ORDER — WARFARIN SODIUM 4 MG PO TABS
4.0000 mg | ORAL_TABLET | Freq: Once | ORAL | Status: DC
  Filled 2017-08-11: qty 1

## 2017-08-11 MED ORDER — LOPERAMIDE HCL 2 MG PO CAPS
2.0000 mg | ORAL_CAPSULE | Freq: Two times a day (BID) | ORAL | Status: DC
  Filled 2017-08-11 (×2): qty 1

## 2017-08-11 MED ORDER — ACETAMINOPHEN 500 MG PO TABS
1000.0000 mg | ORAL_TABLET | Freq: Three times a day (TID) | ORAL | Status: DC
  Administered 2017-08-11: 1000 mg via ORAL
  Filled 2017-08-11: qty 2

## 2017-08-11 NOTE — Progress Notes (Signed)
Patient is been discharged home. Discharge instructions were given to the patient and family.

## 2017-08-11 NOTE — Progress Notes (Signed)
[facility]  [address] [place], [place] [address] Phone: [phone]  FAX: [phone]      [name] [id number] [birth year]  CARE TEAM:  PCP: Antony Contras, MD  Outpatient Care Team: Patient Care Team: Antony Contras, MD as PCP - General (Family Medicine) Thompson Grayer, MD as PCP - Cardiology (Cardiology) Michael Boston, MD as Consulting Physician (General Surgery) Armbruster, Carlota Raspberry, MD as Consulting Physician (Gastroenterology) Serena Colonel, RN as [facility] Management  Inpatient Treatment Team: Treatment Team: Attending Provider: Michael Boston, MD; Technician: Leda Quail, NT; Technician: Denyse Dago, NT; Registered Nurse: Vella Redhead, RN; Physical Therapist: Junius Argyle, PT   Problem List:   Principal Problem:   Recurrent diverticulitis s/p robotic sigmoid colectomy 08/06/2017 Active Problems:   GERD   History of DVT (deep vein thrombosis)   Essential hypertension   Obesity (BMI 30-39.9)   Sleep apnea   Hypokalemia   Chronic anticoagulation (warfarin for Afib)   5 Days Post-Op  08/06/2017  POST-OPERATIVE DIAGNOSIS: Recurrent sigmoid diverticulitis  PROCEDURE:   XI ROBOTIC SIGMOID COLECTOMY RIGID PROCTOSCOPY  SURGEON:  Adin Hector, MD    Assessment  Improved  Plan:  -bowel regimen to help normalize BMs - challenge for her in past.  Fibercon tab (didn't want psyllium).  Bentyl helped.  Imodium BID & for breakthrough if severe (hopefully not too likely)  -Solid diet -control GERD -pathology c/w diverticulosis/itis = benign - d/w patient -Correct low K & Mg -warfarin - reloading -HTN control -Effexor for depression -VTE prophylaxis- SCDs, etc -mobilize as tolerated to help recovery - cleared by PT/OT but not walking much.  Claims daughter available for qa few more days, then it is just her husband (she tends to care for him) and  neighbors.  D/C patient from hospital when patient meets criteria (anticipate later today vs AM):  Tolerating oral intake well Ambulating well Adequate pain control without IV medications Urinating  Having flatus Disposition planning in place   30 minutes spent in review, evaluation, examination, counseling, and coordination of care.  More than 50% of that time was spent in counseling.  D/w RN  Adin Hector, M.D., F.A.C.S. Gastrointestinal and Minimally Invasive Surgery [facility] [address], [place], [place] [address] [phone] Main / Paging   08/11/2017    Subjective: (Chief complaint)  Loose BMs - less w bentyl & imodium Tol soft food Walking better No nausea No pain No fevers  Objective:  Vital signs:  Vitals:   08/10/17 0903 08/10/17 1330 08/10/17 2111 08/11/17 0534  BP:  128/65 120/90 (!) 147/68  Pulse:  89 80 84  Resp:  '13 14 12  '$ Temp:  99.4 F (37.4 C) 99.1 F (37.3 C) 99.1 F (37.3 C)  TempSrc:  Oral Oral Oral  SpO2: 94% 96% 96% 94%  Weight:    102 kg (224 lb 12.8 oz)  Height:        Last BM Date: 08/09/17  Intake/Output   Yesterday:  05/21 0701 - 05/22 0700 In: 1500 [P.O.:1500] Out: 1600 [Urine:1600] This shift:  No intake/output data recorded.  Bowel function:  Flatus: YES  BM:  YES - loose (less than yestrday)  Drain: (No drain)   Physical Exam:  General: Pt awake/alert/oriented x4 in no acute distress.  Tired not toxic Eyes: PERRL, normal EOM.  Sclera clear.  No icterus Neuro: CN II-XII intact w/o focal sensory/motor deficits. Lymph: No  head/neck/groin lymphadenopathy Psych:  No delerium/psychosis/paranoia HENT: Normocephalic, Mucus membranes moist.  No thrush Neck: Supple, No tracheal deviation Chest: No chest wall pain w good excursion CV:  Pulses intact.  Regular rhythm MS: Normal AROM mjr joints.  No obvious deformity  Abdomen: Obese.  Soft.  Nondistended.  Nontender.  No  evidence of peritonitis.  Incisions closed.  No incarcerated hernias.  Ext:   No deformity.  No mjr edema.  No cyanosis Skin: No petechiae / purpura  Results:   PATHOLOGY Diagnosis 1. Colon, segmental resection, rectosigmoid - DIVERTICULAR DISEASE. - ONE BENIGN LYMPH NODE (0/1). - THE SURGICAL RESECTION MARGINS ARE HISTOLOGICALLY VIABLE. - THERE IS NO EVIDENCE OF MALIGNANCY.  2. Colon, resection margin (donut), distal anastomotic - BENIGN COLORECTAL TYPE MUCOSA. - THERE IS NO EVIDENCE OF MALIGNANCY. Enid Cutter MD Pathologist, Electronic Signature (Case signed 08/09/2017) Specimen [name] and Clinical Information Specimen(s) Obtained: 1. Colon, segmental resection, rectosigmoid 2. Colon, resection margin (donut), distal anastomotic Specimen Clinical Information 1. recurrent sigmoid diverticulitis [rd] [name] 1. Specimen: Sigmoid colon and proximal third of rectum. Integrity: Proximal end received opened, distal end received stapled. Length: Sigmoid colon is 15 cm in length. The portion of rectum is 3.5 cm in length. Serosa: Smooth tan-pink, with abundant pericolonic adipose tissue. Contents: Minimal bright green soft contents. Mucosa/Wall: The wall is diffusely 0.3 cm. The mucosa is tan-pink with markedly redundant folding. Discrete mass or lesion is not seen. The lumen is diffusely narrowed to 1.8 cm in circumference. There are innumerable diverticula present, partially filled with bright green fecal material. Perforation is not seen. Diverticula ranges from 0.5 up to 2 cm. Lymph nodes: A single 0.2 cm possible lymph node is sampled. 1 of 2 FINAL for [name], [name] ([id number]) Juliana Boling(continued) Block Summary: A = proximal margin en face B = distal margin en face C = largest diverticulum D = additional diverticula with one lymph node whole. 2. Received in formalin is a donut-shaped 1.8 x 1 x 0.3 cm portion of tan-pink mucosa, with an eccentric staple line. The staple is  removed and the specimen is entirely submitted in one cassette. (AK:ah 08/06/17) Report signed out from the following location(s) Technical component and interpretation was performed at [facility] [place], [place], [place] [address]. CLIA #: [id number], 2 of  Labs: Results for orders placed or performed during the hospital encounter of 08/06/17 (from the past 48 hour(s))  Creatinine, serum     Status: None   Collection Time: 08/10/17 12:33 AM  Result Value Ref Range   Creatinine, Ser 0.58 0.44 - 1.00 mg/dL   GFR calc non Af Amer >60 >60 mL/min   GFR calc Af Amer >60 >60 mL/min    Comment: (NOTE) The eGFR has been calculated using the CKD EPI equation. This calculation has not been validated in all clinical situations. eGFR's persistently <60 mL/min signify possible Chronic Kidney Disease. Performed at [facility], [place] [address] [place], [place] [address]   Protime-INR     Status: Abnormal   Collection Time: 08/10/17  4:22 AM  Result Value Ref Range   Prothrombin Time 15.9 (H) 11.4 - 15.2 seconds   INR 1.29     Comment: Performed at [facility], [place] [address] [place], [place] [address]  CBC     Status: Abnormal   Collection Time: 08/10/17  4:22 AM  Result Value Ref Range   WBC 8.1 4.0 - 10.5 K/uL   RBC 3.28 ([name]) 3.87 -  5.11 MIL/uL   Hemoglobin 9.1 ([name]) 12.0 - 15.0 g/dL   HCT 29.0 ([name]) 36.0 - 46.0 %   MCV 88.4 78.0 - 100.0 fL   MCH 27.7 26.0 - 34.0 pg   MCHC 31.4 30.0 - 36.0 g/dL   RDW 14.0 11.5 - 15.5 %   Platelets 387 150 - 400 K/uL    Comment: Performed at [facility], [place] [address] [place], [place] [address]  Magnesium     Status: Abnormal   Collection Time: 08/10/17  4:22 AM  Result Value Ref Range   Magnesium 1.6 ([name]) 1.7 - 2.4 mg/dL    Comment: Performed at [facility], [place] [address] [place], [place] [address]  Potassium     Status: Abnormal   Collection  Time: 08/10/17  4:22 AM  Result Value Ref Range   Potassium 3.3 ([name]) 3.5 - 5.1 mmol/[name]    Comment: Performed at [facility], [place] [address], [place], [place] [address]  Protime-INR     Status: Abnormal   Collection Time: 08/11/17  4:28 AM  Result Value Ref Range   Prothrombin Time 18.7 (H) 11.4 - 15.2 seconds   INR 1.58     Comment: Performed at [facility], [place] [address] [place], [place] [address]  CBC     Status: Abnormal   Collection Time: 08/11/17  4:28 AM  Result Value Ref Range   WBC 8.5 4.0 - 10.5 K/uL   RBC 3.33 ([name]) 3.87 - 5.11 MIL/uL   Hemoglobin 9.3 ([name]) 12.0 - 15.0 g/dL   HCT 29.2 ([name]) 36.0 - 46.0 %   MCV 87.7 78.0 - 100.0 fL   MCH 27.9 26.0 - 34.0 pg   MCHC 31.8 30.0 - 36.0 g/dL   RDW 14.0 11.5 - 15.5 %   Platelets 457 (H) 150 - 400 K/uL    Comment: Performed at [facility], [place] [address], [place], [place] [address]    Imaging / Studies: No results found.  Medications / Allergies: per chart  Antibiotics: Anti-infectives (From admission, onward)   Start     Dose/Rate Route Frequency Ordered Stop   08/06/17 1600  clindamycin (CLEOCIN) IVPB 900 mg     900 mg 100 mL/hr over 30 Minutes Intravenous Every 8 hours 08/06/17 1402 08/06/17 1640   08/06/17 1400  neomycin (MYCIFRADIN) tablet 1,000 mg  Status:  Discontinued     1,000 mg Oral 3 times per day 08/06/17 0541 08/06/17 0553   08/06/17 1400  metroNIDAZOLE (FLAGYL) tablet 1,000 mg  Status:  Discontinued     1,000 mg Oral 3 times per day 08/06/17 0541 08/06/17 0553   08/06/17 0955  clindamycin (CLEOCIN) 900 mg, gentamicin (GARAMYCIN) 240 mg in sodium chloride 0.9 % 1,000 mL for intraperitoneal lavage  Status:  Discontinued       As needed 08/06/17 1010 08/06/17 1015   08/06/17 0600  gentamicin (GARAMYCIN) 380 mg in dextrose 5 % 100 mL IVPB     5 mg/kg  76 kg (Adjusted) 109.5 mL/hr over 60 Minutes Intravenous 60 min pre-op 08/05/17 1251 08/06/17 0755    08/06/17 0600  clindamycin (CLEOCIN) 900 mg, gentamicin (GARAMYCIN) 240 mg in sodium chloride 0.9 % 1,000 mL for intraperitoneal lavage  Status:  Discontinued      Intraperitoneal To Surgery 08/05/17 1251 08/06/17 1339   08/05/17 1251  clindamycin (CLEOCIN) IVPB 900 mg     900 mg 100 mL/hr over 30 Minutes Intravenous 60  min pre-op 08/05/17 1251 08/06/17 0739        Note: Portions of this report may have been transcribed using voice recognition software. Every effort was made to ensure accuracy; however, inadvertent computerized transcription errors may be present.   Any transcriptional errors that result from this process are unintentional.     Adin Hector, M.D., F.A.C.S. Gastrointestinal and Minimally Invasive Surgery [facility] [address], [place], [place] [address] [phone] Main / Paging   08/11/2017

## 2017-08-11 NOTE — Progress Notes (Signed)
ANTICOAGULATION CONSULT NOTE -  Pharmacy Consult for LMWH bridge and coumadin Indication: atrial fibrillation and hx PE/DVTs  Allergies  Allergen Reactions  . Aspirin Other (See Comments)    Makes asthma worse and makes stomach hurt  . Ivp Dye [Iodinated Diagnostic Agents] Anaphylaxis    Required epinephrine. Since then, has tolerated with premed.  . Latex Other (See Comments) and Cough    Wheezing/cough   . Meperidine Hcl Other (See Comments)    Projectile vomiting   . Penicillins Anaphylaxis    Has patient had a PCN reaction causing immediate rash, facial/tongue/throat swelling, SOB or lightheadedness with hypotension:Yes Has patient had a PCN reaction causing severe rash involving mucus membranes or skin necrosis:Yes Has patient had a PCN reaction that required hospitalization:Treated in ER w/epi & breathing treatments Has patient had a PCN reaction occurring within the last 10 years:No If all of the above answers are "NO", then may proceed with Cephalosporin use.   . Statins Other (See Comments)    Severe leg pain - has tried most of them  . Sulfonamide Derivatives Anaphylaxis  . Banana Swelling and Cough    Inside of the mouth swells  . Food Other (See Comments) and Cough    Nuts- mucus membranes inflamed/cough   . Peanut-Containing Drug Products Swelling and Cough    Inside of the mouth swells  . Tape Hives and Dermatitis    No plastic tape!!    Patient Measurements: Height: 5\' 5"  (165.1 cm) Weight: 224 lb 12.8 oz (102 kg) IBW/kg (Calculated) : 57   Vital Signs: Temp: 99.1 F (37.3 C) (05/22 0534) Temp Source: Oral (05/22 0534) BP: 147/68 (05/22 0534) Pulse Rate: 84 (05/22 0534)  Labs: Recent Labs    08/09/17 0437 08/10/17 0033 08/10/17 0422 08/11/17 0428  HGB 10.9*  --  9.1* 9.3*  HCT 33.7*  --  29.0* 29.2*  PLT 380  --  387 457*  LABPROT 13.6  --  15.9* 18.7*  INR 1.05  --  1.29 1.58  CREATININE  --  0.58  --   --     Estimated Creatinine  Clearance: 85.2 mL/min (by C-G formula based on SCr of 0.58 mg/dL).  Assessment: 62 yo F s/p XI ROBOTIC SIGMOID COLECTOMY.  She was on coumadin PTA for afib and PMH PE/DVTs.  PTA she was on LMWH bridge 100 mg sq q12h.  Home coumadin dose: 6 mg qd x 4 mg MWF.   08/11/2017 INR 1.58, subtherapeutic but increasing H/H low but stable Plts WNL No s/s of bleeding  Goal of Therapy:  INR 2-3 Monitor platelets by anticoagulation protocol: Yes   Plan:  Continue bridge LMWH 100 q12  (give in THIGH per Dr Johney Maine) Coumadin 4 mg po x 1 dose today Daily INR  Dolly Rias RPh 08/11/2017, 7:47 AM Pager [phone]

## 2017-08-11 NOTE — Discharge Summary (Signed)
Physician Discharge Summary  Patient ID: BEVELY HACKBART MRN: [id number] DOB/AGE: January 01, 1956  62 y.o.  Admit date: 08/06/2017 Discharge date: 08/11/2017   Patient Care Team: Antony Contras, MD as PCP - General (Family Medicine) Thompson Grayer, MD as PCP - Cardiology (Cardiology) Michael Boston, MD as Consulting Physician (General Surgery) Armbruster, Carlota Raspberry, MD as Consulting Physician (Gastroenterology) Serena Colonel, RN as [facility]  Discharge Diagnoses:  Principal Problem:   Recurrent diverticulitis s/p robotic sigmoid colectomy 08/06/2017 Active Problems:   GERD   History of DVT (deep vein thrombosis)   Essential hypertension   Obesity (BMI 30-39.9)   Sleep apnea   Hypokalemia   Chronic anticoagulation (warfarin for Afib)   5 Days Post-Op  08/06/2017  POST-OPERATIVE DIAGNOSIS:   recurrent sigmoid diverticulitis  SURGERY:  08/06/2017  Procedure(s): XI ROBOTIC SIGMOID COLECTOMY ERAS PATHWAY PROCTOSCOPY  SURGEON:    Surgeon(s): Michael Boston, MD Leighton Ruff, MD  Consults: None  Hospital Course:   The patient underwent the surgery above.  Postoperatively, the patient gradually mobilized and advanced to a solid diet.  Pain and other symptoms were treated aggressively.    By the time of discharge, the patient was walking well the hallways, eating food, having flatus.  Pain was mild well-controlled on an oral medications.  Diarrhea controlled w usual bentyl as well as PRN imodium.  Based on meeting discharge criteria and continuing to recover, I felt it was safe for the patient to be discharged from the hospital to further recover with close followup. Postoperative recommendations were discussed in detail.  They are written as well.  Discharged Condition: good  Disposition:  Follow-up Information    Michael Boston, MD. Schedule an appointment as soon as possible for a visit in 2 weeks.   Specialty:  General Surgery Why:  To  follow up after your operation, To follow up after your hospital stay Contact information: [place] [address] [phone]           Discharge disposition: 01-Home or Self Care       Discharge Instructions    Call MD for:   Complete by:  As directed    FEVER > 101.5 F  (temperatures < 101.5 F are not significant)   Call MD for:   Complete by:  As directed    FEVER > 101.5 F  (temperatures < 101.5 F are not significant)   Call MD for:  extreme fatigue   Complete by:  As directed    Call MD for:  extreme fatigue   Complete by:  As directed    Call MD for:  persistant dizziness or light-headedness   Complete by:  As directed    Call MD for:  persistant dizziness or light-headedness   Complete by:  As directed    Call MD for:  persistant nausea and vomiting   Complete by:  As directed    Call MD for:  persistant nausea and vomiting   Complete by:  As directed    Call MD for:  redness, tenderness, or signs of infection (pain, swelling, redness, odor or green/yellow discharge around incision site)   Complete by:  As directed    Call MD for:  redness, tenderness, or signs of infection (pain, swelling, redness, odor or green/yellow discharge around incision site)   Complete by:  As directed    Call MD for:  severe uncontrolled pain   Complete by:  As directed  Call MD for:  severe uncontrolled pain   Complete by:  As directed    Diet - low sodium heart healthy   Complete by:  As directed    Start with a bland diet such as soups, liquids, starchy foods, low fat foods, etc. the first few days at home. Gradually advance to a solid, low-fat, high fiber diet by the end of the first week at home.   Add a fiber supplement to your diet (Metamucil, etc) If you feel full, bloated, or constipated, stay on a full liquid or pureed/blenderized diet for a few days until you feel better and are no longer constipated.   Diet - low sodium heart healthy    Complete by:  As directed    Start with a bland diet such as soups, liquids, starchy foods, low fat foods, etc. the first few days at home. Gradually advance to a solid, low-fat, high fiber diet by the end of the first week at home.   Add a fiber supplement to your diet (Metamucil, etc) If you feel full, bloated, or constipated, stay on a full liquid or pureed/blenderized diet for a few days until you feel better and are no longer constipated.   Discharge instructions   Complete by:  As directed    See Discharge Instructions If you are not getting better after two weeks or are noticing you are getting worse, contact our office [phone] for further advice.  We may need to adjust your medications, re-evaluate you in the office, send you to the emergency room, or see what other things we can do to help. The clinic staff is available to answer your questions during regular business hours (8:30am-5pm).  Please don't hesitate to call and ask to speak to one of our nurses for clinical concerns.    A surgeon from [facility] is always on call at the hospitals 24 hours/day If you have a medical emergency, go to the nearest emergency room or call 911.   Discharge instructions   Complete by:  As directed    See Discharge Instructions If you are not getting better after two weeks or are noticing you are getting worse, contact our office [phone] for further advice.  We may need to adjust your medications, re-evaluate you in the office, send you to the emergency room, or see what other things we can do to help. The clinic staff is available to answer your questions during regular business hours (8:30am-5pm).  Please don't hesitate to call and ask to speak to one of our nurses for clinical concerns.    A surgeon from [facility] is always on call at the hospitals 24 hours/day If you have a medical emergency, go to the nearest emergency room or call 911.   Discharge wound  care:   Complete by:  As directed    It is good for closed incision and even open wounds to be washed every day.  Shower every day.  Short baths are fine.  Wash the incisions and wounds clean with soap & water.    If you have a closed incision(s), wash the incision with soap & water every day.  You may leave closed incisions open to air if it is dry.   You may cover the incision with clean gauze & replace it after your daily shower for comfort. If you have skin tapes (Steristrips) or skin glue (Dermabond) on your incision, leave them in place.  They will fall  off on their own like a scab.  You may trim any edges that curl up with clean scissors.  If you have staples, set up an appointment for them to be removed in the office in 10 days after surgery.  If you have a drain, wash around the skin exit site with soap & water and place a new dressing of gauze or band aid around the skin every day.  Keep the drain site clean & dry.   Discharge wound care:   Complete by:  As directed    It is good for closed incisions and even open wounds to be washed every day.  Shower every day.  Short baths are fine.  Wash the incisions and wounds clean with soap & water.     You may leave closed incisions open to air if it is dry.   You may cover the incision with clean gauze & replace it after your daily shower for comfort.   If you have skin tapes (Steristrips) or skin glue (Dermabond) on your incision, leave them in place.  They will fall off on their own like a scab.  You may trim any edges that curl up with clean scissors.    If you have skin staples, set up an appointment for them to be removed in the office in 10 days after surgery.  If you have a drain, wash around the skin exit site with soap & water and place a new dressing of gauze or band aid around the skin every day.  Keep the drain site clean & dry.   Driving Restrictions   Complete by:  As directed    You may drive when: - you are no longer taking  narcotic prescription pain medication - you can comfortably wear a seatbelt - you can safely make sudden turns/stops without pain.   Driving Restrictions   Complete by:  As directed    You may drive when: - you are no longer taking narcotic prescription pain medication - you can comfortably wear a seatbelt - you can safely make sudden turns/stops without pain.   Increase activity slowly   Complete by:  As directed    Start light daily activities --- self-care, walking, climbing stairs- beginning the day after surgery.  Gradually increase activities as tolerated.  Control your pain to be active.  Stop when you are tired.  Ideally, walk several times a day, eventually an hour a day.   Most people are back to most day-to-day activities in a few weeks.  It takes 4-6 weeks to get back to unrestricted, intense activity. If you can walk 30 minutes without difficulty, it is safe to try more intense activity such as jogging, treadmill, bicycling, low-impact aerobics, swimming, etc. Save the most intensive and strenuous activity for last (Usually 4-8 weeks after surgery) such as sit-ups, heavy lifting, contact sports, etc.  Refrain from any intense heavy lifting or straining until you are off narcotics for pain control.  You will have off days, but things should improve week-by-week. DO NOT PUSH THROUGH PAIN.  Let pain be your guide: If it hurts to do something, don't do it.   Increase activity slowly   Complete by:  As directed    Start light daily activities --- self-care, walking, climbing stairs- beginning the day after surgery.  Gradually increase activities as tolerated.  Control your pain to be active.  Stop when you are tired.  Ideally, walk several times a day, eventually an hour a  day.   Most people are back to most day-to-day activities in a few weeks.  It takes 4-6 weeks to get back to unrestricted, intense activity. If you can walk 30 minutes without difficulty, it is safe to try more intense  activity such as jogging, treadmill, bicycling, low-impact aerobics, swimming, etc. Save the most intensive and strenuous activity for last (Usually 4-8 weeks after surgery) such as sit-ups, heavy lifting, contact sports, etc.  Refrain from any intense heavy lifting or straining until you are off narcotics for pain control.  You will have off days, but things should improve week-by-week. DO NOT PUSH THROUGH PAIN.  Let pain be your guide: If it hurts to do something, don't do it.   Lifting restrictions   Complete by:  As directed    If you can walk 30 minutes without difficulty, it is safe to try more intense activity such as jogging, treadmill, bicycling, low-impact aerobics, swimming, etc. Save the most intensive and strenuous activity for last (Usually 4-8 weeks after surgery) such as sit-ups, heavy lifting, contact sports, etc.   Refrain from any intense heavy lifting or straining until you are off narcotics for pain control.  You will have off days, but things should improve week-by-week. DO NOT PUSH THROUGH PAIN.  Let pain be your guide: If it hurts to do something, don't do it.  Pain is your body warning you to avoid that activity for another week until the pain goes down.   Lifting restrictions   Complete by:  As directed    If you can walk 30 minutes without difficulty, it is safe to try more intense activity such as jogging, treadmill, bicycling, low-impact aerobics, swimming, etc. Save the most intensive and strenuous activity for last (Usually 4-8 weeks after surgery) such as sit-ups, heavy lifting, contact sports, etc.   Refrain from any intense heavy lifting or straining until you are off narcotics for pain control.  You will have off days, but things should improve week-by-week. DO NOT PUSH THROUGH PAIN.  Let pain be your guide: If it hurts to do something, don't do it.  Pain is your body warning you to avoid that activity for another week until the pain goes down.   May shower / Bathe    Complete by:  As directed    May shower / Bathe   Complete by:  As directed    May walk up steps   Complete by:  As directed    May walk up steps   Complete by:  As directed    Sexual Activity Restrictions   Complete by:  As directed    You may have sexual intercourse when it is comfortable. If it hurts to do something, stop.   Sexual Activity Restrictions   Complete by:  As directed    You may have sexual intercourse when it is comfortable. If it hurts to do something, stop.      Allergies as of 08/11/2017      Reactions   Aspirin Other (See Comments)   Makes asthma worse and makes stomach hurt   Ivp Dye [iodinated Diagnostic Agents] Anaphylaxis   Required epinephrine. Since then, has tolerated with premed.   Latex Other (See Comments), Cough   Wheezing/cough   Meperidine Hcl Other (See Comments)   Projectile vomiting   Penicillins Anaphylaxis   Has patient had a PCN reaction causing immediate rash, facial/tongue/throat swelling, SOB or lightheadedness with hypotension:Yes Has patient had a PCN reaction causing severe rash  involving mucus membranes or skin necrosis:Yes Has patient had a PCN reaction that required hospitalization:Treated in ER w/epi & breathing treatments Has patient had a PCN reaction occurring within the last 10 years:No If all of the above answers are "NO", then may proceed with Cephalosporin use.   Statins Other (See Comments)   Severe leg pain - has tried most of them   Sulfonamide Derivatives Anaphylaxis   Banana Swelling, Cough   Inside of the mouth swells   Food Other (See Comments), Cough   Nuts- mucus membranes inflamed/cough   Peanut-containing Drug Products Swelling, Cough   Inside of the mouth swells   Tape Hives, Dermatitis   No plastic tape!!      Medication List    STOP taking these medications   MIRALAX packet Generic drug:  polyethylene glycol     TAKE these medications   acetaminophen 650 MG CR tablet Commonly known as:   TYLENOL Take 1,300 mg by mouth 2 (two) times daily.   cetirizine 10 MG tablet Commonly known as:  ZYRTEC Take 10 mg by mouth daily.   dicyclomine 10 MG capsule Commonly known as:  BENTYL Take 1 tablet by mouth 30 min before meals and at bedtime as needed for pain, cramping.   enoxaparin 100 MG/ML injection Commonly known as:  LOVENOX Inject 1 mL (100 mg total) into the skin every 12 (twelve) hours.   FLOVENT HFA 220 MCG/ACT inhaler Generic drug:  fluticasone Inhale 1 puff into the lungs 2 (two) times daily.   fluticasone 50 MCG/ACT nasal spray Commonly known as:  FLONASE Place 1 spray into both nostrils 2 (two) times daily.   losartan 25 MG tablet Commonly known as:  COZAAR Take 1 tablet (25 mg total) by mouth daily.   multivitamin with minerals Tabs tablet Take 1 tablet by mouth daily.   omeprazole 40 MG capsule Commonly known as:  PRILOSEC Take 40 mg by mouth 2 (two) times daily.   PROAIR HFA 108 (90 Base) MCG/ACT inhaler Generic drug:  albuterol Inhale 2 puffs into the lungs every 6 (six) hours as needed for wheezing or shortness of breath.   sucralfate 1 GM/10ML suspension Commonly known as:  CARAFATE Take 10 mLs (1 g total) by mouth every 6 (six) hours as needed. What changed:  reasons to take this   traMADol 50 MG tablet Commonly known as:  ULTRAM Take 1-2 tablets (50-100 mg total) by mouth every 6 (six) hours as needed for moderate pain or severe pain.   venlafaxine XR 37.5 MG 24 hr capsule Commonly known as:  EFFEXOR-XR Take 37.5 mg by mouth daily with breakfast.   verapamil 80 MG tablet Commonly known as:  CALAN Take 160 mg by mouth 2 (two) times daily.   Vitamin D3 1000 units Caps Take 2,000 Units by mouth daily.   warfarin 2 MG tablet Commonly known as:  COUMADIN Take as directed. If you are unsure how to take this medication, talk to your nurse or doctor. Original instructions:  TAKE AS DIRECTED BY COUMADIN CLINIC What changed:    how much  to take  how to take this  when to take this  additional instructions            Discharge Care Instructions  (From admission, onward)        Start     Ordered   08/11/17 0000  Discharge wound care:    Comments:  It is good for closed incisions and even open wounds to  be washed every day.  Shower every day.  Short baths are fine.  Wash the incisions and wounds clean with soap & water.     You may leave closed incisions open to air if it is dry.   You may cover the incision with clean gauze & replace it after your daily shower for comfort.   If you have skin tapes (Steristrips) or skin glue (Dermabond) on your incision, leave them in place.  They will fall off on their own like a scab.  You may trim any edges that curl up with clean scissors.    If you have skin staples, set up an appointment for them to be removed in the office in 10 days after surgery.  If you have a drain, wash around the skin exit site with soap & water and place a new dressing of gauze or band aid around the skin every day.  Keep the drain site clean & dry.   08/11/17 0801   08/06/17 0000  Discharge wound care:    Comments:  It is good for closed incision and even open wounds to be washed every day.  Shower every day.  Short baths are fine.  Wash the incisions and wounds clean with soap & water.    If you have a closed incision(s), wash the incision with soap & water every day.  You may leave closed incisions open to air if it is dry.   You may cover the incision with clean gauze & replace it after your daily shower for comfort. If you have skin tapes (Steristrips) or skin glue (Dermabond) on your incision, leave them in place.  They will fall off on their own like a scab.  You may trim any edges that curl up with clean scissors.  If you have staples, set up an appointment for them to be removed in the office in 10 days after surgery.  If you have a drain, wash around the skin exit site with soap & water and  place a new dressing of gauze or band aid around the skin every day.  Keep the drain site clean & dry.   08/06/17 0703      Significant Diagnostic Studies:  Results for orders placed or performed during the hospital encounter of 08/06/17 (from the past 72 hour(s))  Protime-INR     Status: None   Collection Time: 08/09/17  4:37 AM  Result Value Ref Range   Prothrombin Time 13.6 11.4 - 15.2 seconds   INR 1.05     Comment: Performed at [facility], [place] [address] [place],  [address]  CBC     Status: Abnormal   Collection Time: 08/09/17  4:37 AM  Result Value Ref Range   WBC 11.7 (H) 4.0 - 10.5 K/uL   RBC 3.80 (L) 3.87 - 5.11 MIL/uL   Hemoglobin 10.9 (L) 12.0 - 15.0 g/dL   HCT 33.7 (L) 36.0 - 46.0 %   MCV 88.7 78.0 - 100.0 fL   MCH 28.7 26.0 - 34.0 pg   MCHC 32.3 30.0 - 36.0 g/dL   RDW 14.2 11.5 - 15.5 %   Platelets 380 150 - 400 K/uL    Comment: Performed at [facility], [place] [address] [place],  [address]  Creatinine, serum     Status: None   Collection Time: 08/10/17 12:33 AM  Result Value Ref Range   Creatinine, Ser 0.58 0.44 - 1.00 mg/dL   GFR calc non  Af Amer >60 >60 mL/min   GFR calc Af Amer >60 >60 mL/min    Comment: (NOTE) The eGFR has been calculated using the CKD EPI equation. This calculation has not been validated in all clinical situations. eGFR's persistently <60 mL/min signify possible Chronic Kidney Disease. Performed at [facility], [place] [address] [place], [place] [address]   Protime-INR     Status: Abnormal   Collection Time: 08/10/17  4:22 AM  Result Value Ref Range   Prothrombin Time 15.9 (H) 11.4 - 15.2 seconds   INR 1.29     Comment: Performed at [facility], [place] [address], [place], [place] [address]  CBC     Status: Abnormal   Collection Time: 08/10/17  4:22 AM  Result Value Ref Range   WBC 8.1 4.0 - 10.5 K/uL   RBC 3.28 (L) 3.87 - 5.11 MIL/uL    Hemoglobin 9.1 (L) 12.0 - 15.0 g/dL   HCT 29.0 (L) 36.0 - 46.0 %   MCV 88.4 78.0 - 100.0 fL   MCH 27.7 26.0 - 34.0 pg   MCHC 31.4 30.0 - 36.0 g/dL   RDW 14.0 11.5 - 15.5 %   Platelets 387 150 - 400 K/uL    Comment: Performed at [facility], [place] [address] [place], [place] [address]  Magnesium     Status: Abnormal   Collection Time: 08/10/17  4:22 AM  Result Value Ref Range   Magnesium 1.6 (L) 1.7 - 2.4 mg/dL    Comment: Performed at [facility], [place] [address], [place], [place] [address]  Potassium     Status: Abnormal   Collection Time: 08/10/17  4:22 AM  Result Value Ref Range   Potassium 3.3 (L) 3.5 - 5.1 mmol/L    Comment: Performed at [facility], [place] [address], [place], [place] [address]  Protime-INR     Status: Abnormal   Collection Time: 08/11/17  4:28 AM  Result Value Ref Range   Prothrombin Time 18.7 (H) 11.4 - 15.2 seconds   INR 1.58     Comment: Performed at [facility], [place] [address], [place], [place] [address]  CBC     Status: Abnormal   Collection Time: 08/11/17  4:28 AM  Result Value Ref Range   WBC 8.5 4.0 - 10.5 K/uL   RBC 3.33 (L) 3.87 - 5.11 MIL/uL   Hemoglobin 9.3 (L) 12.0 - 15.0 g/dL   HCT 29.2 (L) 36.0 - 46.0 %   MCV 87.7 78.0 - 100.0 fL   MCH 27.9 26.0 - 34.0 pg   MCHC 31.8 30.0 - 36.0 g/dL   RDW 14.0 11.5 - 15.5 %   Platelets 457 (H) 150 - 400 K/uL    Comment: Performed at [facility], [place] [address] [place],  [address]    No results found.  Discharge Exam: Blood pressure (!) 147/68, pulse 84, temperature 99.1 F (37.3 C), temperature source Oral, resp. rate 12, height '5\' 5"'  (1.651 m), weight 102 kg (224 lb 12.8 oz), SpO2 94 %.  General: Pt awake/alert/oriented x4 in No acute distress Eyes: PERRL, normal EOM.  Sclera clear.  No icterus Neuro: CN II-XII intact w/o focal sensory/motor deficits. Lymph: No head/neck/groin  lymphadenopathy Psych:  No delerium/psychosis/paranoia HENT: Normocephalic, Mucus membranes moist.  No thrush Neck: Supple, No tracheal deviation Chest: No chest wall pain w good excursion CV:  Pulses intact.  Regular rhythm MS: Normal AROM mjr joints.  No obvious deformity Abdomen: Soft.  Nondistended.  Nontender.  No evidence of peritonitis.  No incarcerated hernias. Ext:  SCDs BLE.  No mjr edema.  No cyanosis Skin: No petechiae / purpura  Past Medical History:  Diagnosis Date  . Allergic rhinitis   . Arthritis    "back" (05/07/2016)  . Asthma   . Atrial flutter ([place])    a. Remotely per notes.  . Carpal tunnel syndrome, bilateral   . Colon polyps   . Diverticulitis   . DVT (deep venous thrombosis) ([place]) 1980s x2 -    between birth of two sons. Saw hematology - was told she has a hypercoagulable disorder, should be on Coumadin lifelong.  Marland Kitchen Dysrhythmia    atrial fib  . Family history of adverse reaction to anesthesia    "mother gets PONV"  . Gallbladder disease   . GERD (gastroesophageal reflux disease)   . Halothane adverse reaction    narrow small opening  . Heart murmur   . Hiatal hernia   . History of hiatal hernia    "small one/CTA 05/01/2016" (05/07/2016)  . HTN (hypertension)   . Hypercholesteremia    hx; "brought it down w/diet" (05/07/2016)  . Hyperglycemia    a. A1c 6.1 in 2014.  . Inguinal hernia   . Left sided sciatica   . Normal cardiac stress test 06/2012  . Obesity   . OSA (obstructive sleep apnea)    a. Mild, did not tolerate CPAP (05/07/2016)  . PAF (paroxysmal atrial fibrillation) ([place])    a. s/p afib ablation at [facility] 2010 (Dr. Annabell Howells). b. Recurrent AF s/p DCCV 06/2010. c. On flecainide.   . Paroxysmal atrial fibrillation ([place]) 09/09/2009   Qualifier: Diagnosis of  By: Selena Batten CMA, [place]    . PONV (postoperative nausea and vomiting)   . PPD positive, treated 1977   "treated for 1 yr after exposure to patient"  . Pre-diabetes   . RSV infection ~  2006  . Wandering (atrial) pacemaker    a. Remotely per notes.  (  pt. states has a wandering p wave ) no pacemaker    Past Surgical History:  Procedure Laterality Date  . ATRIAL ABLATION SURGERY  05/07/2016   "fib and flutter"  . ATRIAL FIBRILLATION ABLATION  2010   at [facility]  . ATRIAL FIBRILLATION ABLATION N/A 05/07/2016   Procedure: Atrial Fibrillation Ablation;  Surgeon: Thompson Grayer, MD;  Location: [facility] [facility];  Service: Cardiovascular;  Laterality: N/A;  . BREAST CYST ASPIRATION Bilateral   . CARDIAC CATHETERIZATION  2008  . CARPAL TUNNEL RELEASE Bilateral   . CESAREAN SECTION  1986; 1988  . COLONOSCOPY  X 2  . COLONOSCOPY W/ BIOPSIES AND POLYPECTOMY  X 2  . ESOPHAGOGASTRODUODENOSCOPY    . ESOPHAGOGASTRODUODENOSCOPY (EGD) WITH PROPOFOL N/A 07/19/2017   Procedure: ESOPHAGOGASTRODUODENOSCOPY (EGD) WITH PROPOFOL;  Surgeon: Yetta Flock, MD;  Location: [facility];  Service: Gastroenterology;  Laterality: N/A;  . INGUINAL HERNIA REPAIR Right   . LAPAROSCOPIC CHOLECYSTECTOMY  1989  . POLYPECTOMY N/A 07/19/2017   Procedure: POLYPECTOMY;  Surgeon: Yetta Flock, MD;  Location: [facility];  Service: Gastroenterology;  Laterality: N/A;  . PROCTOSCOPY N/A 08/06/2017   Procedure: PROCTOSCOPY;  Surgeon: Michael Boston, MD;  Location: [facility];  Service: General;  Laterality: N/A;  . TMJ ARTHROPLASTY    . TUBAL LIGATION  1988    Social History   Socioeconomic History  . Marital status: Married    Spouse name: Not  on file  . Number of children: 2  . Years of education: Not on file  . Highest education level: Not on file  Occupational History  . Occupation: Programmer, multimedia: [employer]  . Financial resource strain: Not on file  . Food insecurity:    Worry: Not on file    Inability: Not on file  . Transportation needs:    Medical: Not on file    Non-medical: Not on file  Tobacco Use  . Smoking status: Never Smoker  .  Smokeless tobacco: Never Used  Substance and Sexual Activity  . Alcohol use: Not Currently    Comment: 07-13-17  . Drug use: No  . Sexual activity: Not Currently  Lifestyle  . Physical activity:    Days per week: Not on file    Minutes per session: Not on file  . Stress: Not on file  Relationships  . Social connections:    Talks on phone: Not on file    Gets together: Not on file    Attends religious service: Not on file    Active member of club or organization: Not on file    Attends meetings of clubs or organizations: Not on file    Relationship status: Not on file  . Intimate partner violence:    Fear of current or ex partner: Not on file    Emotionally abused: Not on file    Physically abused: Not on file    Forced sexual activity: Not on file  Other Topics Concern  . Not on file  Social History Narrative  . Not on file    Family History  Problem Relation Age of Onset  . Stroke Mother   . Heart attack Mother        2 previous MIs, 3 stents - CAD first diagnosed 67  . Thyroid disease Mother   . Colon polyps Father   . Lupus Sister   . Heart attack Paternal Grandfather        Died at 46 of massive MI  . Stroke Maternal Grandfather        Multiple family members  . Stroke Paternal Grandmother   . Thyroid disease Sister        x 2  . Colon cancer Neg Hx   . Stomach cancer Neg Hx     Current Facility-Administered Medications  Medication Dose Route Frequency Provider Last Rate Last Dose  . acetaminophen (TYLENOL) tablet 1,000 mg  1,000 mg Oral TID Michael Boston, MD      . albuterol (PROVENTIL) (2.5 MG/3ML) 0.083% nebulizer solution 2.5 mg  2.5 mg Nebulization Q6H PRN Michael Boston, MD      . alum & mag hydroxide-simeth (MAALOX/MYLANTA) 200-200-20 MG/5ML suspension 30 mL  30 mL Oral Q6H PRN Michael Boston, MD      . budesonide (PULMICORT) nebulizer solution 0.5 mg  0.5 mg Nebulization BID Michael Boston, MD   0.5 mg at 08/10/17 2106  . dicyclomine (BENTYL) capsule 10  mg  10 mg Oral Q6H PRN Michael Boston, MD   10 mg at 08/11/17 8786  . diphenhydrAMINE (BENADRYL) 12.5 MG/5ML elixir 12.5 mg  12.5 mg Oral Q6H PRN Michael Boston, MD       Or  . diphenhydrAMINE (BENADRYL) injection 12.5 mg  12.5 mg Intravenous Q6H PRN Michael Boston, MD      . enoxaparin (LOVENOX) injection 100 mg  100 mg Subcutaneous Q12H Eudelia Bunch, [facility]  100 mg at 08/10/17 2238  . feeding supplement (ENSURE SURGERY) liquid 237 mL  237 mL Oral BID BM Michael Boston, MD      . fluticasone (FLONASE) 50 MCG/ACT nasal spray 1 spray  1 spray Each Nare BID Michael Boston, MD   1 spray at 08/10/17 2239  . gabapentin (NEURONTIN) capsule 300 mg  300 mg Oral Ardeen Fillers, MD   300 mg at 08/10/17 2239  . guaiFENesin-dextromethorphan (ROBITUSSIN DM) 100-10 MG/5ML syrup 10 mL  10 mL Oral Q4H PRN Michael Boston, MD      . hydrALAZINE (APRESOLINE) injection 10 mg  10 mg Intravenous Q2H PRN Michael Boston, MD      . hydrocortisone (ANUSOL-HC) 2.5 % rectal cream 1 application  1 application Topical QID PRN Michael Boston, MD      . hydrocortisone cream 1 % 1 application  1 application Topical TID PRN Michael Boston, MD      . HYDROmorphone (DILAUDID) injection 0.5-2 mg  0.5-2 mg Intravenous Q2H PRN Michael Boston, MD   1 mg at 08/09/17 0254  . lip balm (CARMEX) ointment 1 application  1 application Topical BID Michael Boston, MD   1 application at 78/46/96 2239  . loperamide (IMODIUM) capsule 2 mg  2 mg Oral Q8H PRN Michael Boston, MD   2 mg at 08/10/17 0911  . loperamide (IMODIUM) capsule 2 mg  2 mg Oral BID Michael Boston, MD      . loratadine (CLARITIN) tablet 10 mg  10 mg Oral Daily Michael Boston, MD   10 mg at 08/09/17 1001  . losartan (COZAAR) tablet 25 mg  25 mg Oral Daily Michael Boston, MD   25 mg at 08/10/17 1040  . magic mouthwash  15 mL Oral QID PRN Michael Boston, MD      . menthol-cetylpyridinium (CEPACOL) lozenge 3 mg  1 lozenge Oral PRN Michael Boston, MD   3 mg at 08/06/17 2137  . methocarbamol  (ROBAXIN) 1,000 mg in dextrose 5 % 50 mL IVPB  1,000 mg Intravenous Q6H PRN Michael Boston, MD      . methocarbamol (ROBAXIN) tablet 1,000 mg  1,000 mg Oral Q6H PRN Michael Boston, MD      . metoCLOPramide (REGLAN) injection 10 mg  10 mg Intravenous Q6H PRN Michael Boston, MD   10 mg at 08/10/17 1207  . metoprolol tartrate (LOPRESSOR) injection 5 mg  5 mg Intravenous Q6H PRN Michael Boston, MD      . multivitamin with minerals tablet 1 tablet  1 tablet Oral Daily Michael Boston, MD   1 tablet at 08/10/17 1040  . ondansetron (ZOFRAN) tablet 4 mg  4 mg Oral Q6H PRN Michael Boston, MD   4 mg at 08/11/17 0019   Or  . ondansetron (ZOFRAN) injection 4 mg  4 mg Intravenous Q6H PRN Michael Boston, MD   4 mg at 08/10/17 1644  . pantoprazole (PROTONIX) EC tablet 80 mg  80 mg Oral Daily Michael Boston, MD   80 mg at 08/09/17 1001  . phenol (CHLORASEPTIC) mouth spray 1-2 spray  1-2 spray Mouth/Throat PRN Michael Boston, MD      . polycarbophil (FIBERCON) tablet 625 mg  625 mg Oral Daily Michael Boston, MD   625 mg at 08/10/17 1041  . potassium chloride SA (K-DUR,KLOR-CON) CR tablet 40 mEq  40 mEq Oral Daily Michael Boston, MD   40 mEq at 08/10/17 1041  . prochlorperazine (COMPAZINE) tablet 10 mg  10 mg Oral Q6H PRN Ambika Zettlemoyer,  Remo Lipps, MD       Or  . prochlorperazine (COMPAZINE) injection 5-10 mg  5-10 mg Intravenous Q6H PRN Michael Boston, MD   5 mg at 08/08/17 2220  . sucralfate (CARAFATE) 1 GM/10ML suspension 1 g  1 g Oral Q6H PRN Michael Boston, MD      . traMADol Veatrice Bourbon) tablet 50-100 mg  50-100 mg Oral Q6H PRN Michael Boston, MD      . venlafaxine XR (EFFEXOR-XR) 24 hr capsule 37.5 mg  37.5 mg Oral Q breakfast Michael Boston, MD   37.5 mg at 08/10/17 0733  . verapamil (CALAN) tablet 160 mg  160 mg Oral BID Michael Boston, MD   160 mg at 08/10/17 2239  . warfarin (COUMADIN) tablet 4 mg  4 mg Oral ONCE-1800 Angela Adam, [facility]      . Warfarin - Pharmacist Dosing Inpatient   Does not apply q1800 Eudelia Bunch, [facility]          Allergies  Allergen Reactions  . Aspirin Other (See Comments)    Makes asthma worse and makes stomach hurt  . Ivp Dye [Iodinated Diagnostic Agents] Anaphylaxis    Required epinephrine. Since then, has tolerated with premed.  . Latex Other (See Comments) and Cough    Wheezing/cough   . Meperidine Hcl Other (See Comments)    Projectile vomiting   . Penicillins Anaphylaxis    Has patient had a PCN reaction causing immediate rash, facial/tongue/throat swelling, SOB or lightheadedness with hypotension:Yes Has patient had a PCN reaction causing severe rash involving mucus membranes or skin necrosis:Yes Has patient had a PCN reaction that required hospitalization:Treated in ER w/epi & breathing treatments Has patient had a PCN reaction occurring within the last 10 years:No If all of the above answers are "NO", then may proceed with Cephalosporin use.   . Statins Other (See Comments)    Severe leg pain - has tried most of them  . Sulfonamide Derivatives Anaphylaxis  . Banana Swelling and Cough    Inside of the mouth swells  . Food Other (See Comments) and Cough    Nuts- mucus membranes inflamed/cough   . Peanut-Containing Drug Products Swelling and Cough    Inside of the mouth swells  . Tape Hives and Dermatitis    No plastic tape!!    Signed: Morton Peters, M.D., F.A.C.S. Gastrointestinal and Minimally Invasive Surgery [facility] [address], [employer], [place] [phone] Main / Paging   08/11/2017, 8:01 AM

## 2017-08-11 NOTE — Progress Notes (Signed)
Physical Therapy Treatment and Discharge Patient Details Name: [name] MRN: [id number] DOB: October 10, 1955 Today's Date: 08/11/2017    History of Present Illness Pt is a 62 year old female s/p ROBOTIC SIGMOID COLECTOMY ERAS PATHWAY due to recurrent sigmoid diverticulitis    PT Comments    Pt ambulated in hallway and overall mobility is supervision to modified independent.  Pt has been up in her room independently.  Pt hopeful for d/c home soon.  Pt feels able to ambulate remainder of stay and agreeable to no further PT needs at this time.  PT to sign off.    Follow Up Recommendations  No PT follow up     Equipment Recommendations  None recommended by PT    Recommendations for Other Services       Precautions / Restrictions Precautions Precautions: Fall    Mobility  Bed Mobility Overal bed mobility: Modified Independent                Transfers Overall transfer level: Modified independent                  Ambulation/Gait Ambulation/Gait assistance: Supervision;Modified independent (Device/Increase time) Ambulation Distance (Feet): 400 Feet Assistive device: None Gait Pattern/deviations: Step-through pattern;Decreased stride length     General Gait Details: required one seated rest break after 60 feet due to nausea/dizziness (states likely from her head injury prior to admission), pt self requested break (good safety awareness), pt able to ambulate entire unit, no LOB observed   Stairs             Wheelchair Mobility    Modified Rankin (Stroke Patients Only)       Balance                                            Cognition Arousal/Alertness: Awake/alert Behavior During Therapy: WFL for tasks assessed/performed Overall Cognitive Status: Within Functional Limits for tasks assessed                                        Exercises      General Comments        Pertinent Vitals/Pain Pain  Assessment: 0-10 Pain Score: 4  Pain Location: headache Pain Descriptors / Indicators: Headache Pain Intervention(s): Limited activity within patient's tolerance;Repositioned;Monitored during session    Home Living                      Prior Function            PT Goals (current goals can now be found in the care plan section) Progress towards PT goals: Goals met/education completed, patient discharged from PT    Frequency    Min 3X/week      PT Plan Other (comment)(d/c from acute PT)    Co-evaluation              AM-PAC PT "6 Clicks" Daily Activity  Outcome Measure  Difficulty turning over in bed (including adjusting bedclothes, sheets and blankets)?: None Difficulty moving from lying on back to sitting on the side of the bed? : None Difficulty sitting down on and standing up from a chair with arms (e.g., wheelchair, bedside commode, etc,.)?: None Help needed moving to and from a bed to chair (  including a wheelchair)?: None Help needed walking in hospital room?: A Little Help needed climbing 3-5 steps with a railing? : A Little 6 Click Score: 22    End of Session   Activity Tolerance: Patient tolerated treatment well Patient left: in bed;with call bell/phone within reach   PT Visit Diagnosis: Difficulty in walking, not elsewhere classified (R26.2)     Time: 7356-7014 PT Time Calculation (min) (ACUTE ONLY): 21 min  Charges:  $Gait Training: 8-22 mins                    G Codes:      Carmelia Bake, PT, DPT 08/11/2017 Pager: [phone]  [name] 08/11/2017, 1:30 PM

## 2017-08-12 ENCOUNTER — Telehealth: Payer: Self-pay | Admitting: *Deleted

## 2017-08-12 ENCOUNTER — Other Ambulatory Visit: Payer: Self-pay | Admitting: *Deleted

## 2017-08-12 NOTE — Telephone Encounter (Signed)
Pt states she has an appt today but just out of the hospital yesterday and does not feel like coming This nurse asked if taking her Lovenox and she states she was not instructed to restart Informed pt that it is on her medication list Instructed pt to restart Lovenox today and take injection tomorrow before rescheduled appt and she states took Coumadin 4mg  yesterday when she got home and she took 6mg  today Pt instructed to take another tablet (2mg ) today making total of 8mg   and did reschedule her appt for tomorrow  Hospitalized 08/06/2017 to 08/11/2017 Sigmoid colectomy 08/06/2017  08/07/2017 INR 1.16 08/08/2017 INR 1.10 Coumadin 6mg  08/09/2017 INR 1.05 coumadin 7.5mg  08/10/2017 INR 1.29 Coumadin 6mg  08/11/2017 INR 1.58 coumadin 4mg  Pt also had Lovenox injections in the hospital  Pt states understanding of above instructions

## 2017-08-12 NOTE — Patient Outreach (Signed)
[place] [facility]) Care Management  6/72/0947  Maliyah Willets [facility] 0[birth year]36 [id number]   Subjective: Telephone call to patient's home number, spoke with patient, and HIPAA verified.  Discussed [facility] Care Management UMR Transition of care follow up, patient voiced understanding, and is in agreement to follow up.   Patient states she is doing ok, still feeling weak, to be expected per MD, eating, drinking without difficulty, passing gas, is waiting for a call back from Coumadin clinic to verify follow up, and Lovenox treatment plan.  States if the clinic calls back will disconnect call with this RNCM.   States she has a follow up appointment with surgeon on 08/31/17.   Patient states she is able to manage self care and has assistance as needed with activities of daily living / home management.  States Coumadin clinic is calling on other line and call disconnected.   Objective: Per KPN (Knowledge Performance Now, point of care tool) and chart review, patient hospitalized 08/06/17 -08/11/17 for Recurrent diverticulitis  Status post robotic sigmoid colectomy on 08/06/2017.   Patient also has a history of hypertension, OSA, DVT (deep vein thrombosis), and Chronic anticoagulation (warfarin for  AF (Afib).        Assessment: Received UMR Preoperative / Transition of care referral on 08/03/17.   Transition of care follow up completion pending patient contact.      Plan: RNCM will call patient for 2nd telephone outreach attempt, to complete transition of care follow up, within 10 business days if no return call.     Aurilla Coulibaly H. Annia Friendly, BSN, [place] Management [facility] Telephonic CM Phone: [phone] Fax: [phone]

## 2017-08-13 ENCOUNTER — Other Ambulatory Visit: Payer: 59 | Admitting: *Deleted

## 2017-08-13 ENCOUNTER — Ambulatory Visit (INDEPENDENT_AMBULATORY_CARE_PROVIDER_SITE_OTHER): Payer: 59 | Admitting: *Deleted

## 2017-08-13 DIAGNOSIS — Z5181 Encounter for therapeutic drug level monitoring: Secondary | ICD-10-CM

## 2017-08-13 DIAGNOSIS — I4891 Unspecified atrial fibrillation: Secondary | ICD-10-CM | POA: Diagnosis not present

## 2017-08-13 DIAGNOSIS — I480 Paroxysmal atrial fibrillation: Secondary | ICD-10-CM | POA: Diagnosis not present

## 2017-08-13 LAB — POCT INR: INR: 2.1 (ref 2.0–3.0)

## 2017-08-13 NOTE — Patient Outreach (Signed)
[place] [facility]) Care Management  0/63/0160  [name] [facility] [birth year] [id number]   Subjective: Telephone call to patient's home  number, spoke with female answering the phone, states [name] is currently at the MD's office, left HIPAA compliant message, and requested call back.     Objective: Per KPN (Knowledge Performance Now, point of care tool) and chart review, patient hospitalized 08/06/17 -08/11/17 for Recurrent diverticulitis  Status post robotic sigmoid colectomy on 08/06/2017.   Patient also has a history of hypertension, OSA, DVT (deep vein thrombosis), and Chronic anticoagulation (warfarin for  AF (Afib).        Assessment: Received UMR Preoperative / Transition of care referral on 08/03/17.   Transition of care follow up completion pending patient contact.      Plan: RNCM will send patient successful outreach letter, [facility] pamphlet, and magnet due to most of assessment completed on 08/12/17. RNCM will call patient for 3rd telephone outreach attempt, to complete transition of care follow up, within 10 business days if no return call.     [name] H. Annia Friendly, BSN, [place] Management [facility] Telephonic CM Phone: [phone] Fax: [phone]

## 2017-08-13 NOTE — Patient Instructions (Addendum)
Description   Stop Lovenox. Continue taking same dose of coumadin 6mg  daily except 4mg  on Mondays, Wednesdays, and Fridays. Recheck INR 1 week.  Call Coumadin Clinic [phone] Main # [phone] with any questions

## 2017-08-17 ENCOUNTER — Other Ambulatory Visit: Payer: Self-pay | Admitting: *Deleted

## 2017-08-17 NOTE — Patient Outreach (Signed)
[facility]) Care Management  6/60/6004  [name] [facility] [birth year] [id number]   Subjective: Telephone call to patient's home number, no answer, left HIPAA compliant voicemail message, and requested call back.      Objective:Per KPN (Knowledge Performance Now, point of care tool) and chart review,patient hospitalized 08/06/17 -08/11/17 forRecurrent diverticulitisStatus postrobotic sigmoid colectomy on5/17/2019. Patient also has a history of hypertension, OSA, DVT (deep vein thrombosis), andChronic anticoagulation (warfarin forAF (Afib).     Assessment: Received UMR Preoperative / Transition of care referral on 08/03/17.Transition of care follow upcompletionpending patient contact.     Plan:RNCM has sent patient successful outreach letter, [facility] pamphlet, and magnet due to most of assessment completed on 08/12/17. RNCM will call patient for 4th telephone outreach attempt,to completetransition of care follow up, within 10 business days if no return call.     [name] H. Annia Friendly, BSN, [place] Management [facility] Telephonic CM Phone: [phone] Fax: [phone]

## 2017-08-18 ENCOUNTER — Other Ambulatory Visit: Payer: Self-pay | Admitting: *Deleted

## 2017-08-18 NOTE — Patient Outreach (Signed)
[facility]) Care Management  1/66/0630  [name] [facility] [birth year] [id number]   Subjective: Received voicemail message from [facility], states she is returning call and requested call back.  Telephone call to patient's home number, spoke with patient, and HIPAA verified.  States she remembers speaking with this RNCM in the past and is in agreement to follow up.   Patient states she is feeling better, had follow up appointment with surgeon on 08/12/17 for urgent appointment due to wound separation, sanguineous drainage, and dizziness.   Wound drainage has decreased, dry dressing applied when needed, and dizziness has decreased.   Wellsite geologist evaluated wound and drainage to be expected, she was instructed to return if any changes or if sooner follow up appointment needed.   States MD also advised dizziness to be expected to due to the blood after surgery, hemoglobin decreased from 12.4 to 9.0.  Patient voiced understanding is aware of signs and symptoms to report to MD.  States she has follow up with surgeon on 08/31/17 and Coumadin clinic on 08/20/17.  States INR is at 2.1, Lovenox discontinued on 08/13/17, and Coumadin has resumed per Coumadin clinic protocol.  States have daily bowel movements, soft consistency, and no issues with constipation.  Patient voices understanding of medical diagnosis, surgery, and treatment plan. Patient states she is able to manage self care and has assistance as needed with activities of daily living. States she is accessing the following [facility] benefits: outpatient pharmacy (uses local pharmacy through [employer]), hospital indemnity (not chosen), short term disability, long term disability, and has family medical leave act (FMLA) in place.  Patient states she does not have any education material, transition of care, care coordination, disease management, disease monitoring, transportation, community resource, or pharmacy needs at this time.   States she is very appreciative of the follow up and is in agreement to receive [place] Management information.    Objective:Per KPN (Knowledge Performance Now, point of care tool) and chart review,patient hospitalized 08/06/17 -08/11/17 forRecurrent diverticulitisStatus postrobotic sigmoid colectomy on5/17/2019. Patient also has a history of hypertension, OSA, DVT (deep vein thrombosis), andChronic anticoagulation (warfarin forAF (Afib).     Assessment: Received UMR Preoperative / Transition of care referral on 08/03/17.Transition of care follow up completed, no care management needs, and will proceed with case closure.      Plan:RNCM has sent patient successful outreach letter, [facility] pamphlet, and magnet. RNCM will complete case closure due to follow up completed / no care management needs.       Carolan Avedisian H. Annia Friendly, BSN, [place] Management [facility] Telephonic CM Phone: [phone] Fax: [phone]

## 2017-08-20 ENCOUNTER — Ambulatory Visit (INDEPENDENT_AMBULATORY_CARE_PROVIDER_SITE_OTHER): Payer: 59 | Admitting: *Deleted

## 2017-08-20 DIAGNOSIS — I4891 Unspecified atrial fibrillation: Secondary | ICD-10-CM

## 2017-08-20 DIAGNOSIS — I480 Paroxysmal atrial fibrillation: Secondary | ICD-10-CM | POA: Diagnosis not present

## 2017-08-20 DIAGNOSIS — Z5181 Encounter for therapeutic drug level monitoring: Secondary | ICD-10-CM

## 2017-08-20 LAB — POCT INR: INR: 1.9 — AB (ref 2.0–3.0)

## 2017-08-20 NOTE — Patient Instructions (Signed)
Description   Today take 5mg  today then continue taking same dose of coumadin 6mg  daily except 4mg  on Mondays, Wednesdays, and Fridays. Recheck INR 11 days.  Call Coumadin Clinic [phone] Main # [phone] with any questions

## 2017-08-23 ENCOUNTER — Other Ambulatory Visit: Payer: Self-pay | Admitting: Gastroenterology

## 2017-08-25 ENCOUNTER — Encounter: Payer: Self-pay | Admitting: Internal Medicine

## 2017-08-25 ENCOUNTER — Ambulatory Visit (INDEPENDENT_AMBULATORY_CARE_PROVIDER_SITE_OTHER): Payer: 59 | Admitting: Internal Medicine

## 2017-08-25 VITALS — BP 120/60 | HR 88 | Ht 65.0 in | Wt 220.0 lb

## 2017-08-25 DIAGNOSIS — I10 Essential (primary) hypertension: Secondary | ICD-10-CM | POA: Diagnosis not present

## 2017-08-25 DIAGNOSIS — I480 Paroxysmal atrial fibrillation: Secondary | ICD-10-CM

## 2017-08-25 NOTE — Patient Instructions (Addendum)

## 2017-08-25 NOTE — Progress Notes (Signed)
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 female who presents today for routine electrophysiology followup.  Since last being seen in our clinic, the patient reports doing reasonably well.  She is s/p recent resection for diverticulitis.  She has made good recovery.  No afib!  Today, she denies symptoms of palpitations, chest pain, shortness of breath,  lower extremity edema, dizziness, presyncope, or syncope.  The patient is otherwise without complaint today.   Past Medical History:  Diagnosis Date  . Allergic rhinitis   . Arthritis    "back" (05/07/2016)  . Asthma   . Atrial flutter ([place])    a. Remotely per notes.  . Carpal tunnel syndrome, bilateral   . Colon polyps   . Diverticulitis   . DVT (deep venous thrombosis) ([place]) 1980s x2 -    between birth of two sons. Saw hematology - was told she has a hypercoagulable disorder, should be on Coumadin lifelong.  Marland Kitchen Dysrhythmia    atrial fib  . Family history of adverse reaction to anesthesia    "mother gets PONV"  . Gallbladder disease   . GERD (gastroesophageal reflux disease)   . Halothane adverse reaction    narrow small opening  . Heart murmur   . Hiatal hernia   . History of hiatal hernia    "small one/CTA 05/01/2016" (05/07/2016)  . HTN (hypertension)   . Hypercholesteremia    hx; "brought it down w/diet" (05/07/2016)  . Hyperglycemia    a. A1c 6.1 in 2014.  . Inguinal hernia   . Left sided sciatica   . Normal cardiac stress test 06/2012  . Obesity   . OSA (obstructive sleep apnea)    a. Mild, did not tolerate CPAP (05/07/2016)  . PAF (paroxysmal atrial fibrillation) ([place])    a. s/p afib ablation at [facility] 2010 (Dr. Annabell Howells). b. Recurrent AF s/p DCCV 06/2010. c. On flecainide.   . Paroxysmal atrial fibrillation ([place]) 09/09/2009   Qualifier: Diagnosis of  By: Selena Batten CMA, [place]    . PONV (postoperative nausea and vomiting)   . PPD positive, treated 1977   "treated for 1 yr after  exposure to patient"  . Pre-diabetes   . RSV infection ~ 2006  . Wandering (atrial) pacemaker    a. Remotely per notes.  (  pt. states has a wandering p wave ) no pacemaker   Past Surgical History:  Procedure Laterality Date  . ATRIAL ABLATION SURGERY  05/07/2016   "fib and flutter"  . ATRIAL FIBRILLATION ABLATION  2010   at [facility]  . ATRIAL FIBRILLATION ABLATION N/A 05/07/2016   Procedure: Atrial Fibrillation Ablation;  Surgeon: Thompson Grayer, MD;  Location: [facility];  Service: Cardiovascular;  Laterality: N/A;  . BREAST CYST ASPIRATION Bilateral   . CARDIAC CATHETERIZATION  2008  . CARPAL TUNNEL RELEASE Bilateral   . CESAREAN SECTION  1986; 1988  . COLONOSCOPY  X 2  . COLONOSCOPY W/ BIOPSIES AND POLYPECTOMY  X 2  . ESOPHAGOGASTRODUODENOSCOPY    . ESOPHAGOGASTRODUODENOSCOPY (EGD) WITH PROPOFOL N/A 07/19/2017   Procedure: ESOPHAGOGASTRODUODENOSCOPY (EGD) WITH PROPOFOL;  Surgeon: Yetta Flock, MD;  Location: [facility];  Service: Gastroenterology;  Laterality: N/A;  . INGUINAL HERNIA REPAIR Right   . LAPAROSCOPIC CHOLECYSTECTOMY  1989  . POLYPECTOMY N/A 07/19/2017   Procedure: POLYPECTOMY;  Surgeon: Yetta Flock, MD;  Location: [facility];  Service: Gastroenterology;  Laterality: N/A;  . PROCTOSCOPY N/A  08/06/2017   Procedure: PROCTOSCOPY;  Surgeon: Michael Boston, MD;  Location: [facility];  Service: General;  Laterality: N/A;  . TMJ ARTHROPLASTY    . TUBAL LIGATION  1988    ROS- all systems are reviewed and negatives except as per HPI above  Current Outpatient Medications  Medication Sig Dispense Refill  . acetaminophen (TYLENOL) 650 MG CR tablet Take 1,300 mg by mouth 2 (two) times daily.    . cetirizine (ZYRTEC) 10 MG tablet Take 10 mg by mouth daily.      . Cholecalciferol (VITAMIN D3) 1000 UNITS CAPS Take 2,000 Units by mouth daily.     Marland Kitchen dicyclomine (BENTYL) 10 MG capsule Take 1 tablet by mouth 30 min before meals and at bedtime as needed for  pain, cramping. 120 capsule 1  . FLOVENT HFA 220 MCG/ACT inhaler Inhale 1 puff into the lungs 2 (two) times daily.  1  . fluticasone (FLONASE) 50 MCG/ACT nasal spray Place 1 spray into both nostrils 2 (two) times daily.     Marland Kitchen losartan (COZAAR) 25 MG tablet Take 1 tablet (25 mg total) by mouth daily. 90 tablet 0  . Multiple Vitamin (MULTIVITAMIN WITH MINERALS) TABS tablet Take 1 tablet by mouth daily.    Marland Kitchen omeprazole (PRILOSEC) 40 MG capsule TAKE 1 CAPSULE BY MOUTH TWICE A DAY 60 capsule 5  . PROAIR HFA 108 (90 BASE) MCG/ACT inhaler Inhale 2 puffs into the lungs every 6 (six) hours as needed for wheezing or shortness of breath.     . sucralfate (CARAFATE) 1 GM/10ML suspension Take 10 mLs (1 g total) by mouth every 6 (six) hours as needed. 420 mL 1  . venlafaxine XR (EFFEXOR-XR) 37.5 MG 24 hr capsule Take 37.5 mg by mouth daily with breakfast.     . verapamil (CALAN) 80 MG tablet Take 160 mg by mouth 2 (two) times daily.    Marland Kitchen warfarin (COUMADIN) 2 MG tablet TAKE AS DIRECTED BY COUMADIN CLINIC (Patient taking differently: Take 4-6 mg by mouth See admin instructions. Take 6 mg by mouth in the morning on Sun/Tues/Thurs/Sat and 4 mg on Mon/Wed/Fri) 225 tablet 1   No current facility-administered medications for this visit.     Physical Exam: Vitals:   08/25/17 1639  BP: 120/60  Pulse: 88  Weight: 220 lb (99.8 kg)  Height: 5\' 5"  (1.651 m)    GEN- The patient is well appearing, alert and oriented x 3 today.   Head- normocephalic, atraumatic Eyes-  Sclera clear, conjunctiva pink Ears- hearing intact Oropharynx- clear Lungs- Clear to ausculation bilaterally, normal work of breathing Heart- Regular rate and rhythm, no murmurs, rubs or gallops, PMI not laterally displaced GI- soft, NT, ND, + BS Extremities- no clubbing, cyanosis, or edema  Wt Readings from Last 3 Encounters:  08/25/17 220 lb (99.8 kg)  08/11/17 224 lb 12.8 oz (102 kg)  08/02/17 230 lb 8 oz (104.6 kg)    EKG tracing  ordered today is personally reviewed and shows sinus rhythm 88 bpm, PR 186 msec, QRS 82 msec, QTc 450 msec  Assessment and Plan:  1. afib Well controlled post ablation off AAD therapy chads2vasc score is at least 2.  Given prior DVTs should remain on life long coumadin  2. HTN Stable No change required today  3. Mild OSA Followed by Dr Maxwell Caul Conservative measures advised  Return in 6 months  Thompson Grayer MD, [facility] 08/25/2017 4:50 PM

## 2017-08-26 ENCOUNTER — Ambulatory Visit: Payer: Self-pay | Admitting: *Deleted

## 2017-08-31 ENCOUNTER — Ambulatory Visit (INDEPENDENT_AMBULATORY_CARE_PROVIDER_SITE_OTHER): Payer: 59 | Admitting: *Deleted

## 2017-08-31 DIAGNOSIS — Z86718 Personal history of other venous thrombosis and embolism: Secondary | ICD-10-CM | POA: Diagnosis not present

## 2017-08-31 DIAGNOSIS — Z5181 Encounter for therapeutic drug level monitoring: Secondary | ICD-10-CM

## 2017-08-31 DIAGNOSIS — L7634 Postprocedural seroma of skin and subcutaneous tissue following other procedure: Secondary | ICD-10-CM | POA: Diagnosis not present

## 2017-08-31 DIAGNOSIS — I4891 Unspecified atrial fibrillation: Secondary | ICD-10-CM

## 2017-08-31 DIAGNOSIS — I480 Paroxysmal atrial fibrillation: Secondary | ICD-10-CM | POA: Diagnosis not present

## 2017-08-31 LAB — POCT INR: INR: 2 (ref 2.0–3.0)

## 2017-08-31 NOTE — Patient Instructions (Signed)
Description   Continue taking same dose of coumadin 6mg  daily except 4mg  on Mondays, Wednesdays, and Fridays. Recheck INR  2 weeks.  Call Coumadin Clinic [phone] Main # [phone] with any questions

## 2017-09-14 ENCOUNTER — Ambulatory Visit (INDEPENDENT_AMBULATORY_CARE_PROVIDER_SITE_OTHER): Payer: 59 | Admitting: *Deleted

## 2017-09-14 DIAGNOSIS — Z5181 Encounter for therapeutic drug level monitoring: Secondary | ICD-10-CM | POA: Diagnosis not present

## 2017-09-14 DIAGNOSIS — I480 Paroxysmal atrial fibrillation: Secondary | ICD-10-CM | POA: Diagnosis not present

## 2017-09-14 DIAGNOSIS — I4891 Unspecified atrial fibrillation: Secondary | ICD-10-CM

## 2017-09-14 LAB — POCT INR: INR: 2.2 (ref 2.0–3.0)

## 2017-09-14 NOTE — Patient Instructions (Signed)
Description   Continue taking same dose of coumadin 6mg  daily except 4mg  on Mondays, Wednesdays, and Fridays. Recheck INR 4 weeks.  Call Coumadin Clinic [phone] Main # [phone] with any questions

## 2017-10-09 ENCOUNTER — Other Ambulatory Visit: Payer: Self-pay | Admitting: Internal Medicine

## 2017-12-02 ENCOUNTER — Ambulatory Visit (INDEPENDENT_AMBULATORY_CARE_PROVIDER_SITE_OTHER): Payer: 59 | Admitting: *Deleted

## 2017-12-02 DIAGNOSIS — I4891 Unspecified atrial fibrillation: Secondary | ICD-10-CM

## 2017-12-02 DIAGNOSIS — I480 Paroxysmal atrial fibrillation: Secondary | ICD-10-CM | POA: Diagnosis not present

## 2017-12-02 DIAGNOSIS — Z5181 Encounter for therapeutic drug level monitoring: Secondary | ICD-10-CM

## 2017-12-02 LAB — POCT INR: INR: 2.8 (ref 2.0–3.0)

## 2017-12-02 NOTE — Patient Instructions (Signed)
Description   Continue taking same dose of coumadin 6mg daily except 4mg on Mondays, Wednesdays, and Fridays. Recheck INR 6 weeks.  Call Coumadin Clinic [phone] Main # [phone] with any questions      

## 2018-01-13 ENCOUNTER — Ambulatory Visit (INDEPENDENT_AMBULATORY_CARE_PROVIDER_SITE_OTHER): Payer: 59 | Admitting: *Deleted

## 2018-01-13 DIAGNOSIS — I480 Paroxysmal atrial fibrillation: Secondary | ICD-10-CM

## 2018-01-13 DIAGNOSIS — Z5181 Encounter for therapeutic drug level monitoring: Secondary | ICD-10-CM

## 2018-01-13 DIAGNOSIS — I4891 Unspecified atrial fibrillation: Secondary | ICD-10-CM

## 2018-01-13 LAB — POCT INR: INR: 2.1 (ref 2.0–3.0)

## 2018-01-13 NOTE — Patient Instructions (Signed)
Description   Continue taking same dose of coumadin 6mg daily except 4mg on Mondays, Wednesdays, and Fridays. Recheck INR 6 weeks.  Call Coumadin Clinic [phone] Main # [phone] with any questions      

## 2018-01-19 ENCOUNTER — Emergency Department (HOSPITAL_COMMUNITY): Payer: 59

## 2018-01-19 ENCOUNTER — Emergency Department (HOSPITAL_COMMUNITY)
Admission: EM | Admit: 2018-01-19 | Discharge: 2018-01-19 | Disposition: A | Discharge status: Home / Self Care | Payer: 59 | Attending: Emergency Medicine | Admitting: Emergency Medicine

## 2018-01-19 ENCOUNTER — Encounter (HOSPITAL_COMMUNITY): Payer: Self-pay | Admitting: *Deleted

## 2018-01-19 DIAGNOSIS — H81399 Other peripheral vertigo, unspecified ear: Secondary | ICD-10-CM | POA: Diagnosis not present

## 2018-01-19 DIAGNOSIS — Z79899 Other long term (current) drug therapy: Secondary | ICD-10-CM | POA: Diagnosis not present

## 2018-01-19 DIAGNOSIS — R7303 Prediabetes: Secondary | ICD-10-CM | POA: Insufficient documentation

## 2018-01-19 DIAGNOSIS — Z7901 Long term (current) use of anticoagulants: Secondary | ICD-10-CM | POA: Insufficient documentation

## 2018-01-19 DIAGNOSIS — I10 Essential (primary) hypertension: Secondary | ICD-10-CM | POA: Insufficient documentation

## 2018-01-19 DIAGNOSIS — R42 Dizziness and giddiness: Secondary | ICD-10-CM | POA: Diagnosis not present

## 2018-01-19 DIAGNOSIS — Z91040 Latex allergy status: Secondary | ICD-10-CM | POA: Diagnosis not present

## 2018-01-19 DIAGNOSIS — R270 Ataxia, unspecified: Secondary | ICD-10-CM | POA: Diagnosis not present

## 2018-01-19 DIAGNOSIS — J45909 Unspecified asthma, uncomplicated: Secondary | ICD-10-CM | POA: Insufficient documentation

## 2018-01-19 DIAGNOSIS — Z91010 Allergy to peanuts: Secondary | ICD-10-CM | POA: Diagnosis not present

## 2018-01-19 LAB — I-STAT CHEM 8, ED
BUN: 11 mg/dL (ref 8–23)
CALCIUM ION: 1.16 mmol/L (ref 1.15–1.40)
CHLORIDE: 102 mmol/L (ref 98–111)
Creatinine, Ser: 0.6 mg/dL (ref 0.44–1.00)
Glucose, Bld: 194 mg/dL — ABNORMAL HIGH (ref 70–99)
HCT: 33 % — ABNORMAL LOW (ref 36.0–46.0)
HEMOGLOBIN: 11.2 g/dL — AB (ref 12.0–15.0)
POTASSIUM: 3.5 mmol/L (ref 3.5–5.1)
SODIUM: 137 mmol/L (ref 135–145)
TCO2: 24 mmol/L (ref 22–32)

## 2018-01-19 LAB — APTT: APTT: 37 s — AB (ref 24–36)

## 2018-01-19 LAB — DIFFERENTIAL
Abs Immature Granulocytes: 0.01 10*3/uL (ref 0.00–0.07)
BASOS ABS: 0.1 10*3/uL (ref 0.0–0.1)
BASOS PCT: 1 %
EOS PCT: 2 %
Eosinophils Absolute: 0.1 10*3/uL (ref 0.0–0.5)
Immature Granulocytes: 0 %
Lymphocytes Relative: 41 %
Lymphs Abs: 2.8 10*3/uL (ref 0.7–4.0)
MONO ABS: 0.4 10*3/uL (ref 0.1–1.0)
Monocytes Relative: 6 %
NEUTROS PCT: 50 %
Neutro Abs: 3.5 10*3/uL (ref 1.7–7.7)

## 2018-01-19 LAB — COMPREHENSIVE METABOLIC PANEL
ALT: 17 U/L (ref 0–44)
ANION GAP: 10 (ref 5–15)
AST: 22 U/L (ref 15–41)
Albumin: 3.3 g/dL — ABNORMAL LOW (ref 3.5–5.0)
Alkaline Phosphatase: 95 U/L (ref 38–126)
BUN: 10 mg/dL (ref 8–23)
CALCIUM: 9.3 mg/dL (ref 8.9–10.3)
CHLORIDE: 103 mmol/L (ref 98–111)
CO2: 24 mmol/L (ref 22–32)
Creatinine, Ser: 0.62 mg/dL (ref 0.44–1.00)
GFR calc non Af Amer: >60 mL/min (ref >60)
Glucose, Bld: 191 mg/dL — ABNORMAL HIGH (ref 70–99)
Potassium: 3.4 mmol/L — ABNORMAL LOW (ref 3.5–5.1)
SODIUM: 137 mmol/L (ref 135–145)
Total Bilirubin: 0.4 mg/dL (ref 0.3–1.2)
Total Protein: 6.8 g/dL (ref 6.5–8.1)

## 2018-01-19 LAB — CBC
HCT: 33.9 % — ABNORMAL LOW (ref 36.0–46.0)
Hemoglobin: 10 g/dL — ABNORMAL LOW (ref 12.0–15.0)
MCH: 23.6 pg — ABNORMAL LOW (ref 26.0–34.0)
MCHC: 29.5 g/dL — AB (ref 30.0–36.0)
MCV: 80.1 fL (ref 80.0–100.0)
Platelets: 471 10*3/uL — ABNORMAL HIGH (ref 150–400)
RBC: 4.23 MIL/uL (ref 3.87–5.11)
RDW: 16.1 % — AB (ref 11.5–15.5)
WBC: 6.9 10*3/uL (ref 4.0–10.5)
nRBC: 0 % (ref 0.0–0.2)

## 2018-01-19 LAB — PROTIME-INR
INR: 2.4
PROTHROMBIN TIME: 25.8 s — AB (ref 11.4–15.2)

## 2018-01-19 LAB — I-STAT TROPONIN, ED: Troponin i, poc: 0 ng/mL (ref 0.00–0.08)

## 2018-01-19 MED ORDER — MECLIZINE HCL 25 MG PO TABS: 25.0000 mg | ORAL_TABLET | Freq: Three times a day (TID) | ORAL | 0 refills | Status: AC | PRN

## 2018-01-19 MED ORDER — MECLIZINE HCL 25 MG PO TABS
25.0000 mg | ORAL_TABLET | Freq: Once | ORAL | Status: AC
  Administered 2018-01-19: 25 mg via ORAL
  Filled 2018-01-19: qty 1

## 2018-01-19 NOTE — ED Notes (Signed)
Patient transported to MR. 

## 2018-01-19 NOTE — ED Triage Notes (Signed)
Pt in c/o dizziness that started last night around midnight, states this has happened before with her afib and she has some postural dizziness but this is constant, if she lays still without moving symptoms improve but do not resolve

## 2018-01-19 NOTE — ED Provider Notes (Signed)
[facility] EMERGENCY DEPARTMENT Provider Note   CSN: [id number] Arrival date & time: 01/19/18  1113     History   Chief Complaint Chief Complaint  Patient presents with  . Dizziness    HPI [name] is a 62 y.o. female.  HPI Pt had some dizziness when standing attributed to her BP last week.  Sx resolved but then Pt started having dizziness suddenly last night.  The room seems to be moving and she feels like she is going to fall. Pt is falling to the right. No trouble with speech or vision.  No history of stroke.  Pt is on coumadin for a fib Past Medical History:  Diagnosis Date  . Allergic rhinitis   . Arthritis    "back" (05/07/2016)  . Asthma   . Atrial flutter ([place])    a. Remotely per notes.  . Carpal tunnel syndrome, bilateral   . Colon polyps   . Diverticulitis   . DVT (deep venous thrombosis) ([place]) 1980s x2 -    between birth of two sons. Saw hematology - was told she has a hypercoagulable disorder, should be on Coumadin lifelong.  Marland Kitchen Dysrhythmia    atrial fib  . Family history of adverse reaction to anesthesia    "mother gets PONV"  . Gallbladder disease   . GERD (gastroesophageal reflux disease)   . Halothane adverse reaction    narrow small opening  . Heart murmur   . Hiatal hernia   . History of hiatal hernia    "small one/CTA 05/01/2016" (05/07/2016)  . HTN (hypertension)   . Hypercholesteremia    hx; "brought it down w/diet" (05/07/2016)  . Hyperglycemia    a. A1c 6.1 in 2014.  . Inguinal hernia   . Left sided sciatica   . Normal cardiac stress test 06/2012  . Obesity   . OSA (obstructive sleep apnea)    a. Mild, did not tolerate CPAP (05/07/2016)  . PAF (paroxysmal atrial fibrillation) ([place])    a. s/p afib ablation at [facility] 2010 (Dr. Annabell Howells). b. Recurrent AF s/p DCCV 06/2010. c. On flecainide.   . Paroxysmal atrial fibrillation ([place]) 09/09/2009   Qualifier: Diagnosis of  By: Selena Batten CMA, [place]    . PONV (postoperative  nausea and vomiting)   . PPD positive, treated 1977   "treated for 1 yr after exposure to patient"  . Pre-diabetes   . RSV infection ~ 2006  . Wandering (atrial) pacemaker    a. Remotely per notes.  (  pt. states has a wandering p wave ) no pacemaker    Patient Active Problem List   Diagnosis Date Noted  . Chronic anticoagulation (warfarin for Afib) 08/06/2017  . Gastric polyp   . Fall 07/08/2017  . Scalp laceration 07/08/2017  . Hypokalemia 07/08/2017  . Recurrent diverticulitis s/p robotic sigmoid colectomy 08/06/2017 11/24/2016  . Right hip pain 05/14/2015  . Obesity (BMI 30-39.9) 01/10/2014  . Sleep apnea 01/10/2014  . Encounter for therapeutic drug monitoring 04/26/2013  . Essential hypertension 03/01/2013  . History of DVT (deep vein thrombosis) 01/12/2013  . Hyperglycemia 01/12/2013  . Dizziness 07/09/2010  . HYPERCHOLESTEROLEMIA 09/10/2009  . Carpal tunnel syndrome 09/10/2009  . Asthma 09/10/2009  . TMJ SYNDROME 09/10/2009  . GERD 09/10/2009  . GALLBLADDER DISEASE 09/10/2009    Past Surgical History:  Procedure Laterality Date  . ATRIAL ABLATION SURGERY  05/07/2016   "fib and flutter"  . ATRIAL FIBRILLATION ABLATION  2010   at [facility]  . ATRIAL FIBRILLATION ABLATION N/A 05/07/2016   Procedure: Atrial Fibrillation Ablation;  Surgeon: Thompson Grayer, MD;  Location: [facility];  Service: Cardiovascular;  Laterality: N/A;  . BREAST CYST ASPIRATION Bilateral   . CARDIAC CATHETERIZATION  2008  . CARPAL TUNNEL RELEASE Bilateral   . CESAREAN SECTION  1986; 1988  . COLONOSCOPY  X 2  . COLONOSCOPY W/ BIOPSIES AND POLYPECTOMY  X 2  . ESOPHAGOGASTRODUODENOSCOPY    . ESOPHAGOGASTRODUODENOSCOPY (EGD) WITH PROPOFOL N/A 07/19/2017   Procedure: ESOPHAGOGASTRODUODENOSCOPY (EGD) WITH PROPOFOL;  Surgeon: Yetta Flock, MD;  Location: [facility];  Service: Gastroenterology;  Laterality: N/A;  . INGUINAL HERNIA REPAIR Right   . LAPAROSCOPIC CHOLECYSTECTOMY  1989   . POLYPECTOMY N/A 07/19/2017   Procedure: POLYPECTOMY;  Surgeon: Yetta Flock, MD;  Location: [facility];  Service: Gastroenterology;  Laterality: N/A;  . PROCTOSCOPY N/A 08/06/2017   Procedure: PROCTOSCOPY;  Surgeon: Michael Boston, MD;  Location: [facility];  Service: General;  Laterality: N/A;  . TMJ ARTHROPLASTY    . TUBAL LIGATION  1988     OB History   None      Home Medications    Prior to Admission medications   Medication Sig Start Date End Date Taking? Authorizing Provider  acetaminophen (TYLENOL) 650 MG CR tablet Take 1,300 mg by mouth every 8 (eight) hours as needed for pain.    Yes [provider]  cetirizine (ZYRTEC) 10 MG tablet Take 10 mg by mouth daily.     Yes [provider]  Cholecalciferol (VITAMIN D3) 1000 UNITS CAPS Take 2,000 Units by mouth daily.    Yes [provider]  dicyclomine (BENTYL) 10 MG capsule Take 1 tablet by mouth 30 min before meals and at bedtime as needed for pain, cramping. 11/24/16  Yes Esterwood, Amy S, PA-C  FLOVENT HFA 220 MCG/ACT inhaler Inhale 1 puff into the lungs 2 (two) times daily. 04/20/16  Yes [provider]  fluticasone (FLONASE) 50 MCG/ACT nasal spray Place 1 spray into both nostrils 2 (two) times daily.  06/02/12  Yes [provider]  losartan (COZAAR) 25 MG tablet Take 1 tablet (25 mg total) by mouth daily. 12/20/13  Yes Allred, Jeneen Rinks, MD  Multiple Vitamin (MULTIVITAMIN WITH MINERALS) TABS tablet Take 1 tablet by mouth daily.   Yes [provider]  omeprazole (PRILOSEC) 40 MG capsule TAKE 1 CAPSULE BY MOUTH TWICE A DAY Patient taking differently: Take 40 mg by mouth 2 (two) times daily.  08/23/17  Yes Armbruster, Carlota Raspberry, MD  PROAIR HFA 108 (90 BASE) MCG/ACT inhaler Inhale 2 puffs into the lungs every 6 (six) hours as needed for wheezing or shortness of breath.  03/22/12  Yes [provider]  venlafaxine XR (EFFEXOR-XR) 37.5 MG 24 hr capsule Take 37.5 mg by mouth daily  with breakfast.  11/21/12  Yes [provider]  verapamil (CALAN) 80 MG tablet Take 160 mg by mouth 2 (two) times daily.   Yes [provider]  warfarin (COUMADIN) 2 MG tablet TAKE AS DIRECTED BY COUMADIN CLINIC Patient taking differently: See admin instructions. TAKE AS DIRECTED BY COUMADIN CLINIC -  4MG  on Monday, Wednesday, and Friday  6MG  on Tuesday, Thursday, Saturday, and Sunday. 10/11/17  Yes Allred, Jeneen Rinks, MD  meclizine (ANTIVERT) 25 MG tablet Take 1 tablet (25 mg total) by mouth 3 (three) times daily as needed for dizziness. 01/19/18   Dorie Rank, MD  sucralfate (CARAFATE) 1 GM/10ML suspension Take 10 mLs (1  g total) by mouth every 6 (six) hours as needed. Patient not taking: Reported on 01/19/2018 06/15/17   Armbruster, Carlota Raspberry, MD    Family History Family History  Problem Relation Age of Onset  . Stroke Mother   . Heart attack Mother        2 previous MIs, 3 stents - CAD first diagnosed 42  . Thyroid disease Mother   . Colon polyps Father   . Lupus Sister   . Heart attack Paternal Grandfather        Died at 44 of massive MI  . Stroke Maternal Grandfather        Multiple family members  . Stroke Paternal Grandmother   . Thyroid disease Sister        x 2  . Colon cancer Neg Hx   . Stomach cancer Neg Hx     Social History Social History   Tobacco Use  . Smoking status: Never Smoker  . Smokeless tobacco: Never Used  Substance Use Topics  . Alcohol use: Not Currently    Comment: 07-13-17  . Drug use: No     Allergies   Aspirin; Ivp dye [iodinated diagnostic agents]; Latex; Meperidine hcl; Penicillins; Statins; Sulfonamide derivatives; Banana; Food; Peanut-containing drug products; and Tape   Review of Systems Review of Systems  All other systems reviewed and are negative.    Physical Exam Updated Vital Signs BP 134/75 (BP Location: Right Arm)   Pulse 91   Temp 98 F (36.7 C) (Oral)   Resp 20   SpO2 100%   Physical Exam    Constitutional: She is oriented to person, place, and time. She appears well-developed and well-nourished. No distress.  HENT:  Head: Normocephalic and atraumatic.  Right Ear: External ear normal.  Left Ear: External ear normal.  Mouth/Throat: Oropharynx is clear and moist.  Eyes: Conjunctivae are normal. Right eye exhibits no discharge. Left eye exhibits no discharge. No scleral icterus.  Neck: Neck supple. No tracheal deviation present.  Cardiovascular: Normal rate, regular rhythm and intact distal pulses.  Pulmonary/Chest: Effort normal and breath sounds normal. No stridor. No respiratory distress. She has no wheezes. She has no rales.  Abdominal: Soft. Bowel sounds are normal. She exhibits no distension. There is no tenderness. There is no rebound and no guarding.  Musculoskeletal: She exhibits no edema or tenderness.  Neurological: She is alert and oriented to person, place, and time. She has normal strength. No cranial nerve deficit (No facial droop, extraocular movements intact, tongue midline ) or sensory deficit. She exhibits normal muscle tone. She displays no seizure activity. Gait abnormal. Coordination normal.  No pronator drift bilateral upper extrem, able to hold both legs off bed for 5 seconds, sensation intact in all extremities, no visual field cuts, no left or right sided neglect, normal finger-nose exam bilaterally, no nystagmus noted   Skin: Skin is warm and dry. No rash noted.  Psychiatric: She has a normal mood and affect.  Nursing note and vitals reviewed.    ED Treatments / Results  Labs (all labs ordered are listed, but only abnormal results are displayed) Labs Reviewed  PROTIME-INR - Abnormal; Notable for the following components:      Result Value   Prothrombin Time 25.8 (*)    All other components within normal limits  APTT - Abnormal; Notable for the following components:   aPTT 37 (*)    All other components within normal limits  CBC - Abnormal;  Notable for the following  components:   Hemoglobin 10.0 (*)    HCT 33.9 (*)    MCH 23.6 (*)    MCHC 29.5 (*)    RDW 16.1 (*)    Platelets 471 (*)    All other components within normal limits  COMPREHENSIVE METABOLIC PANEL - Abnormal; Notable for the following components:   Potassium 3.4 (*)    Glucose, Bld 191 (*)    Albumin 3.3 (*)    All other components within normal limits  I-STAT CHEM 8, ED - Abnormal; Notable for the following components:   Glucose, Bld 194 (*)    Hemoglobin 11.2 (*)    HCT 33.0 (*)    All other components within normal limits  DIFFERENTIAL  I-STAT TROPONIN, ED  CBG MONITORING, ED    EKG EKG Interpretation  Date/Time:  Wednesday January 19 2018 11:15:03 EDT Ventricular Rate:  90 PR Interval:  154 QRS Duration: 84 QT Interval:  384 QTC Calculation: 469 R Axis:   33 Text Interpretation:  Normal sinus rhythm Cannot rule out Anterior infarct , age undetermined Abnormal ECG No significant change since last tracing Confirmed by Dorie Rank [phone]) on 01/19/2018 1:05:43 PM   Radiology Mr Brain Wo Contrast  Result Date: 01/19/2018 CLINICAL DATA:  Ataxia. Postural dizziness. Suspect stroke. History of atrial fibrillation, hypertension, hypercholesterolemia. EXAM: MRI HEAD WITHOUT CONTRAST TECHNIQUE: Multiplanar, multiecho pulse sequences of the brain and surrounding structures were obtained without intravenous contrast. COMPARISON:  CT HEAD July 07, 2017 and MRI head June 22, 2010 FINDINGS: INTRACRANIAL CONTENTS: No reduced diffusion to suggest acute ischemia. No susceptibility artifact to suggest hemorrhage. The ventricles and sulci are normal for patient's age. A few nonspecific punctate supratentorial white matter FLAIR T2 hyperintensities, slightly increased from prior MRI. With tongue is playing no suspicious parenchymal signal, masses, mass effect. No abnormal extra-axial fluid collections. No extra-axial masses. VASCULAR: Normal major intracranial vascular  flow voids present at skull base. SKULL AND UPPER CERVICAL SPINE: No abnormal sellar expansion. No suspicious calvarial bone marrow signal. Craniocervical junction maintained. Scalp scarring. SINUSES/ORBITS: The mastoid air-cells and included paranasal sinuses are well-aerated.The included ocular globes and orbital contents are non-suspicious. OTHER: None. IMPRESSION: No acute intracranial process; negative noncontrast MRI head for age. Electronically Signed   By: Elon Alas M.D.   On: 01/19/2018 15:12    Procedures Procedures (including critical care time)  Medications Ordered in ED Medications  meclizine (ANTIVERT) tablet 25 mg (25 mg Oral Given 01/19/18 1323)     Initial Impression / Assessment and Plan / ED Course  I have reviewed the triage vital signs and the nursing notes.  Pertinent labs & imaging results that were available during my care of the patient were reviewed by me and considered in my medical decision making (see chart for details).   Labs viewed.  No significant abnormalities.  Patient's symptoms were concerning for the possibility of stroke.  MRI was performed and does not show any evidence of abnormalities.  Symptoms most likely related to peripheral vertigo.  At this time there does not appear to be any evidence of an acute emergency medical condition and the patient appears stable for discharge with appropriate outpatient follow up.   Final Clinical Impressions(s) / ED Diagnoses   Final diagnoses:  Peripheral vertigo, unspecified laterality    ED Discharge Orders         Ordered    meclizine (ANTIVERT) 25 MG tablet  3 times daily PRN     01/19/18 1621  Dorie Rank, MD 01/19/18 [phone]

## 2018-01-19 NOTE — Discharge Instructions (Addendum)
Take the meclizine as needed, try doing the epley manuevers, follow up with your doctor next week to make sure you are improving

## 2018-02-01 DIAGNOSIS — R232 Flushing: Secondary | ICD-10-CM | POA: Diagnosis not present

## 2018-02-01 DIAGNOSIS — E1169 Type 2 diabetes mellitus with other specified complication: Secondary | ICD-10-CM | POA: Diagnosis not present

## 2018-02-01 DIAGNOSIS — J309 Allergic rhinitis, unspecified: Secondary | ICD-10-CM | POA: Diagnosis not present

## 2018-02-01 DIAGNOSIS — I10 Essential (primary) hypertension: Secondary | ICD-10-CM | POA: Diagnosis not present

## 2018-02-01 DIAGNOSIS — J45909 Unspecified asthma, uncomplicated: Secondary | ICD-10-CM | POA: Diagnosis not present

## 2018-02-01 DIAGNOSIS — R42 Dizziness and giddiness: Secondary | ICD-10-CM | POA: Diagnosis not present

## 2018-02-01 DIAGNOSIS — E782 Mixed hyperlipidemia: Secondary | ICD-10-CM | POA: Diagnosis not present

## 2018-02-01 DIAGNOSIS — K219 Gastro-esophageal reflux disease without esophagitis: Secondary | ICD-10-CM | POA: Diagnosis not present

## 2018-02-21 ENCOUNTER — Encounter: Payer: Self-pay | Admitting: Internal Medicine

## 2018-02-21 ENCOUNTER — Ambulatory Visit (INDEPENDENT_AMBULATORY_CARE_PROVIDER_SITE_OTHER): Payer: 59 | Admitting: *Deleted

## 2018-02-21 ENCOUNTER — Ambulatory Visit (INDEPENDENT_AMBULATORY_CARE_PROVIDER_SITE_OTHER): Payer: 59 | Admitting: Internal Medicine

## 2018-02-21 VITALS — BP 128/74 | HR 94 | Ht 65.0 in | Wt 231.4 lb

## 2018-02-21 DIAGNOSIS — Z5181 Encounter for therapeutic drug level monitoring: Secondary | ICD-10-CM

## 2018-02-21 DIAGNOSIS — I480 Paroxysmal atrial fibrillation: Secondary | ICD-10-CM

## 2018-02-21 DIAGNOSIS — I4891 Unspecified atrial fibrillation: Secondary | ICD-10-CM

## 2018-02-21 DIAGNOSIS — I10 Essential (primary) hypertension: Secondary | ICD-10-CM

## 2018-02-21 LAB — POCT INR: INR: 2.6 (ref 2.0–3.0)

## 2018-02-21 NOTE — Patient Instructions (Signed)
Medication Instructions:  Your physician recommends that you continue on your current medications as directed. Please refer to the Current Medication list given to you today.  * If you need a refill on your cardiac medications before your next appointment, please call your pharmacy.   Labwork: None ordered  Testing/Procedures: None ordered  Follow-Up: Your physician wants you to follow-up in: 6 months with Roderic Palau, NP in the AFib clinic. You will receive a reminder letter in the mail two months in advance. If you don't receive a letter, please call our office to schedule the follow-up appointment.  Your physician wants you to follow-up in: 1 year with Dr. Rayann Heman. You will receive a reminder letter in the mail two months in advance. If you don't receive a letter, please call our office to schedule the follow-up appointment.   Thank you for choosing [facility]!!   [phone]

## 2018-02-21 NOTE — Patient Instructions (Signed)
Description   Continue taking same dose of coumadin 6mg  daily except 4mg  on Mondays, Wednesdays, and Fridays. Recheck INR 6 weeks.  Call Coumadin Clinic [phone] Main # [phone] with any questions

## 2018-02-21 NOTE — Progress Notes (Signed)
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 female who presents today for routine electrophysiology followup.  Since last being seen in our clinic, the patient reports doing very well.  Today, she denies symptoms of palpitations, chest pain, shortness of breath,  lower extremity edema, presyncope, or syncope.  Recently seen in the ED with vertigo.  This has improved though she does have occasional postural dizziness  The patient is otherwise without complaint today.   Past Medical History:  Diagnosis Date  . Allergic rhinitis   . Arthritis    "back" (05/07/2016)  . Asthma   . Atrial flutter ([place])    a. Remotely per notes.  . Carpal tunnel syndrome, bilateral   . Colon polyps   . Diverticulitis   . DVT (deep venous thrombosis) ([place]) 1980s x2 -    between birth of two sons. Saw hematology - was told she has a hypercoagulable disorder, should be on Coumadin lifelong.  Marland Kitchen Dysrhythmia    atrial fib  . Family history of adverse reaction to anesthesia    "mother gets PONV"  . Gallbladder disease   . GERD (gastroesophageal reflux disease)   . Halothane adverse reaction    narrow small opening  . Heart murmur   . Hiatal hernia   . History of hiatal hernia    "small one/CTA 05/01/2016" (05/07/2016)  . HTN (hypertension)   . Hypercholesteremia    hx; "brought it down w/diet" (05/07/2016)  . Hyperglycemia    a. A1c 6.1 in 2014.  . Inguinal hernia   . Left sided sciatica   . Normal cardiac stress test 06/2012  . Obesity   . OSA (obstructive sleep apnea)    a. Mild, did not tolerate CPAP (05/07/2016)  . PAF (paroxysmal atrial fibrillation) ([place])    a. s/p afib ablation at [facility] 2010 (Dr. Annabell Howells). b. Recurrent AF s/p DCCV 06/2010. c. On flecainide.   . Paroxysmal atrial fibrillation ([place]) 09/09/2009   Qualifier: Diagnosis of  By: Selena Batten CMA, [place]    . PONV (postoperative nausea and vomiting)   . PPD positive, treated 1977   "treated for 1 yr after  exposure to patient"  . Pre-diabetes   . RSV infection ~ 2006  . Wandering (atrial) pacemaker    a. Remotely per notes.  (  pt. states has a wandering p wave ) no pacemaker   Past Surgical History:  Procedure Laterality Date  . ATRIAL ABLATION SURGERY  05/07/2016   "fib and flutter"  . ATRIAL FIBRILLATION ABLATION  2010   at [facility]  . ATRIAL FIBRILLATION ABLATION N/A 05/07/2016   Procedure: Atrial Fibrillation Ablation;  Surgeon: Thompson Grayer, MD;  Location: [place] [facility];  Service: Cardiovascular;  Laterality: N/A;  . BREAST CYST ASPIRATION Bilateral   . CARDIAC CATHETERIZATION  2008  . CARPAL TUNNEL RELEASE Bilateral   . CESAREAN SECTION  1986; 1988  . COLONOSCOPY  X 2  . COLONOSCOPY W/ BIOPSIES AND POLYPECTOMY  X 2  . ESOPHAGOGASTRODUODENOSCOPY    . ESOPHAGOGASTRODUODENOSCOPY (EGD) WITH PROPOFOL N/A 07/19/2017   Procedure: ESOPHAGOGASTRODUODENOSCOPY (EGD) WITH PROPOFOL;  Surgeon: Yetta Flock, MD;  [facility];  Service: Gastroenterology;  Laterality: N/A;  . INGUINAL HERNIA REPAIR Right   . LAPAROSCOPIC CHOLECYSTECTOMY  1989  . POLYPECTOMY N/A 07/19/2017   Procedure: POLYPECTOMY;  Surgeon: Yetta Flock, MD;  Location: [facility];  Service: Gastroenterology;  Laterality: N/A;  . PROCTOSCOPY  N/A 08/06/2017   Procedure: PROCTOSCOPY;  Surgeon: Michael Boston, MD;  [facility];  Service: General;  Laterality: N/A;  . TMJ ARTHROPLASTY    . TUBAL LIGATION  1988    ROS- all systems are reviewed and negatives except as per HPI above  Current Outpatient Medications  Medication Sig Dispense Refill  . acetaminophen (TYLENOL) 650 MG CR tablet Take 1,300 mg by mouth every 8 (eight) hours as needed for pain.     . cetirizine (ZYRTEC) 10 MG tablet Take 10 mg by mouth daily.      . Cholecalciferol (VITAMIN D3) 1000 UNITS CAPS Take 2,000 Units by mouth daily.     Marland Kitchen dicyclomine (BENTYL) 10 MG capsule Take 1 tablet by mouth 30 min before meals and at  bedtime as needed for pain, cramping. 120 capsule 1  . FLOVENT HFA 220 MCG/ACT inhaler Inhale 1 puff into the lungs 2 (two) times daily.  1  . fluticasone (FLONASE) 50 MCG/ACT nasal spray Place 1 spray into both nostrils 2 (two) times daily.     Marland Kitchen losartan (COZAAR) 25 MG tablet Take 1 tablet (25 mg total) by mouth daily. 90 tablet 0  . meclizine (ANTIVERT) 25 MG tablet Take 1 tablet (25 mg total) by mouth 3 (three) times daily as needed for dizziness. 30 tablet 0  . Multiple Vitamin (MULTIVITAMIN WITH MINERALS) TABS tablet Take 1 tablet by mouth daily.    Marland Kitchen omeprazole (PRILOSEC) 40 MG capsule TAKE 1 CAPSULE BY MOUTH TWICE A DAY (Patient taking differently: Take 40 mg by mouth 2 (two) times daily. ) 60 capsule 5  . PROAIR HFA 108 (90 BASE) MCG/ACT inhaler Inhale 2 puffs into the lungs every 6 (six) hours as needed for wheezing or shortness of breath.     . sucralfate (CARAFATE) 1 GM/10ML suspension Take 10 mLs (1 g total) by mouth every 6 (six) hours as needed. 420 mL 1  . venlafaxine XR (EFFEXOR-XR) 37.5 MG 24 hr capsule Take 37.5 mg by mouth daily with breakfast.     . verapamil (CALAN) 80 MG tablet Take 160 mg by mouth 2 (two) times daily.    Marland Kitchen warfarin (COUMADIN) 2 MG tablet TAKE AS DIRECTED BY COUMADIN CLINIC (Patient taking differently: See admin instructions. TAKE AS DIRECTED BY COUMADIN CLINIC -  4MG  on Monday, Wednesday, and Friday  6MG  on Tuesday, Thursday, Saturday, and Sunday.) 225 tablet 1   No current facility-administered medications for this visit.     Physical Exam: Vitals:   02/21/18 0907  BP: 128/74  Pulse: 94  SpO2: 94%  Weight: 231 lb 6.4 oz (105 kg)  Height: 5\' 5"  (1.651 m)    GEN- The patient is well appearing, alert and oriented x 3 today.   Head- normocephalic, atraumatic Eyes-  Sclera clear, conjunctiva pink Ears- hearing intact Oropharynx- clear Lungs- Clear to ausculation bilaterally, normal work of breathing Heart- Regular rate and rhythm, no murmurs,  rubs or gallops, PMI not laterally displaced GI- soft, NT, ND, + BS Extremities- no clubbing, cyanosis, or edema  Wt Readings from Last 3 Encounters:  02/21/18 231 lb 6.4 oz (105 kg)  08/25/17 220 lb (99.8 kg)  08/11/17 224 lb 12.8 oz (102 kg)    EKG tracing ordered today is personally reviewed and shows sinus rhythm 94 bpm (possibly ectopic atrial rhythm, though likely sinus), PR 166 msec, QRS 84 msec, Qtc 467 msec  Assessment and Plan:  1. afib Well controlled post ablation off AAD therapy She is  pleased with her rhythm chads2vasc score of 2,  Prior DVT.  Continue lifelong anticoagulation.  She is clear in her preference of coumadin  2. Mild OSA Follows with Dr Maxwell Caul  3. HTN Stable No change required today  4. Overweight She has gained 11 lbs since June Lifestyle modification encouraged  5. Anemia/ thrombocytosis Labs from 01/19/18 (ED) reviewed with her.  I have advised that she follow-up with her PCP.  Return to see Butch Penny in the AF clinic in 6 months  Thompson Grayer MD, [facility] 02/21/2018 9:24 AM

## 2018-02-25 ENCOUNTER — Other Ambulatory Visit: Payer: Self-pay | Admitting: Gastroenterology

## 2018-03-10 ENCOUNTER — Telehealth: Payer: Self-pay | Admitting: Hematology & Oncology

## 2018-03-10 NOTE — Telephone Encounter (Signed)
lmom for pt to return call to office re new patient appt. appt letter mailed for 04/05/18 at 1030 am

## 2018-03-24 ENCOUNTER — Telehealth: Payer: Self-pay | Admitting: Hematology & Oncology

## 2018-03-24 NOTE — Telephone Encounter (Signed)
sw pt to r/s lab and new patient appt on 04/05/18 to 04/14/18 at 2 pm due to Dr Marin Olp being out of the office 1/14

## 2018-04-04 ENCOUNTER — Ambulatory Visit (INDEPENDENT_AMBULATORY_CARE_PROVIDER_SITE_OTHER): Payer: 59 | Admitting: Pharmacist

## 2018-04-04 DIAGNOSIS — Z5181 Encounter for therapeutic drug level monitoring: Secondary | ICD-10-CM | POA: Diagnosis not present

## 2018-04-04 DIAGNOSIS — I4891 Unspecified atrial fibrillation: Secondary | ICD-10-CM

## 2018-04-04 DIAGNOSIS — I480 Paroxysmal atrial fibrillation: Secondary | ICD-10-CM | POA: Diagnosis not present

## 2018-04-04 LAB — POCT INR: INR: 3.4 — AB (ref 2.0–3.0)

## 2018-04-04 NOTE — Patient Instructions (Signed)
Description   Do not take your coumadin dose tomorrow. Continue taking same dose of coumadin 6mg  daily except 4mg  on Mondays, Wednesdays, and Fridays. Recheck INR 4 weeks.  Call Coumadin Clinic [phone] Main # [phone] with any questions

## 2018-04-05 ENCOUNTER — Ambulatory Visit: Payer: 59 | Admitting: Hematology & Oncology

## 2018-04-05 ENCOUNTER — Other Ambulatory Visit: Payer: 59

## 2018-04-05 ENCOUNTER — Ambulatory Visit (INDEPENDENT_AMBULATORY_CARE_PROVIDER_SITE_OTHER): Payer: 59 | Admitting: Podiatry

## 2018-04-05 ENCOUNTER — Encounter: Payer: Self-pay | Admitting: Podiatry

## 2018-04-05 VITALS — BP 139/71 | HR 74

## 2018-04-05 DIAGNOSIS — D689 Coagulation defect, unspecified: Secondary | ICD-10-CM

## 2018-04-05 DIAGNOSIS — M79674 Pain in right toe(s): Secondary | ICD-10-CM

## 2018-04-05 DIAGNOSIS — M79675 Pain in left toe(s): Secondary | ICD-10-CM | POA: Diagnosis not present

## 2018-04-05 DIAGNOSIS — B351 Tinea unguium: Secondary | ICD-10-CM | POA: Diagnosis not present

## 2018-04-05 NOTE — Progress Notes (Signed)
This patient presents the office with chief complaint of thick dicolored toenails on both feet.  Patient states that she is unable to self treat and trim her nails.  She says she has had these thick nails for 6 to 7 years and was previously treated by Dr. Blenda Mounts.  She says he prescribed a topical solution to apply to the nails which only helped one nail on both feet.  She presently is taking Coumadin for A. Fib.  She presents the office today for an evaluation and treatment of her long thick nails  General Appearance  Alert, conversant and in no acute stress.  Vascular  Dorsalis pedis and posterior tibial  pulses are palpable  bilaterally.  Capillary return is within normal limits  bilaterally. Temperature is within normal limits  bilaterally.  Neurologic  Senn-Weinstein monofilament wire test within normal limits  bilaterally. Muscle power within normal limits bilaterally.  Nails Thick disfigured discolored nails with subungual debris  from hallux to fifth toes right.  Thick mycotic second toenail left foot.  . No evidence of bacterial infection or drainage bilaterally.  Orthopedic  No limitations of motion  feet .  No crepitus or effusions noted.  No bony pathology or digital deformities noted.  Skin  normotropic skin with no porokeratosis noted bilaterally.  No signs of infections or ulcers noted.    Onychomycosis     IE.  Debride mycotic nails  X 6.  Discussed conservative treatment vs. Surgical removal of nails.  Patient to return to the office in 4 months for continued preventative foot care services.   Gardiner Barefoot DPM

## 2018-04-06 ENCOUNTER — Other Ambulatory Visit: Payer: Self-pay | Admitting: Internal Medicine

## 2018-04-11 DIAGNOSIS — N959 Unspecified menopausal and perimenopausal disorder: Secondary | ICD-10-CM | POA: Diagnosis not present

## 2018-04-14 ENCOUNTER — Inpatient Hospital Stay (HOSPITAL_BASED_OUTPATIENT_CLINIC_OR_DEPARTMENT_OTHER): Payer: 59 | Admitting: Hematology & Oncology

## 2018-04-14 ENCOUNTER — Inpatient Hospital Stay: Payer: 59 | Attending: Hematology & Oncology

## 2018-04-14 ENCOUNTER — Other Ambulatory Visit: Payer: Self-pay

## 2018-04-14 ENCOUNTER — Encounter: Payer: Self-pay | Admitting: Hematology & Oncology

## 2018-04-14 VITALS — BP 139/66 | HR 70 | Temp 97.8°F | Wt 237.0 lb

## 2018-04-14 DIAGNOSIS — D508 Other iron deficiency anemias: Secondary | ICD-10-CM | POA: Insufficient documentation

## 2018-04-14 DIAGNOSIS — K219 Gastro-esophageal reflux disease without esophagitis: Secondary | ICD-10-CM

## 2018-04-14 DIAGNOSIS — I480 Paroxysmal atrial fibrillation: Secondary | ICD-10-CM | POA: Diagnosis not present

## 2018-04-14 DIAGNOSIS — Z79899 Other long term (current) drug therapy: Secondary | ICD-10-CM

## 2018-04-14 DIAGNOSIS — K5793 Diverticulitis of intestine, part unspecified, without perforation or abscess with bleeding: Secondary | ICD-10-CM | POA: Diagnosis not present

## 2018-04-14 DIAGNOSIS — Z7901 Long term (current) use of anticoagulants: Secondary | ICD-10-CM | POA: Insufficient documentation

## 2018-04-14 DIAGNOSIS — G4733 Obstructive sleep apnea (adult) (pediatric): Secondary | ICD-10-CM | POA: Diagnosis not present

## 2018-04-14 DIAGNOSIS — Z86718 Personal history of other venous thrombosis and embolism: Secondary | ICD-10-CM | POA: Diagnosis not present

## 2018-04-14 DIAGNOSIS — M1990 Unspecified osteoarthritis, unspecified site: Secondary | ICD-10-CM

## 2018-04-14 DIAGNOSIS — D473 Essential (hemorrhagic) thrombocythemia: Secondary | ICD-10-CM | POA: Diagnosis not present

## 2018-04-14 DIAGNOSIS — I10 Essential (primary) hypertension: Secondary | ICD-10-CM | POA: Insufficient documentation

## 2018-04-14 DIAGNOSIS — K922 Gastrointestinal hemorrhage, unspecified: Secondary | ICD-10-CM

## 2018-04-14 DIAGNOSIS — E7800 Pure hypercholesterolemia, unspecified: Secondary | ICD-10-CM

## 2018-04-14 DIAGNOSIS — K909 Intestinal malabsorption, unspecified: Secondary | ICD-10-CM | POA: Diagnosis not present

## 2018-04-14 DIAGNOSIS — E669 Obesity, unspecified: Secondary | ICD-10-CM | POA: Insufficient documentation

## 2018-04-14 DIAGNOSIS — R5383 Other fatigue: Secondary | ICD-10-CM | POA: Insufficient documentation

## 2018-04-14 DIAGNOSIS — D500 Iron deficiency anemia secondary to blood loss (chronic): Secondary | ICD-10-CM | POA: Insufficient documentation

## 2018-04-14 HISTORY — DX: Intestinal malabsorption, unspecified: K90.9

## 2018-04-14 HISTORY — DX: Iron deficiency anemia secondary to blood loss (chronic): D50.0

## 2018-04-14 HISTORY — DX: Gastrointestinal hemorrhage, unspecified: K92.2

## 2018-04-14 LAB — CBC WITH DIFFERENTIAL (CANCER CENTER ONLY)
Abs Immature Granulocytes: 0.01 10*3/uL (ref 0.00–0.07)
BASOS PCT: 1 %
Basophils Absolute: 0.1 10*3/uL (ref 0.0–0.1)
EOS ABS: 0.1 10*3/uL (ref 0.0–0.5)
Eosinophils Relative: 1 %
HCT: 32.7 % — ABNORMAL LOW (ref 36.0–46.0)
Hemoglobin: 9.7 g/dL — ABNORMAL LOW (ref 12.0–15.0)
IMMATURE GRANULOCYTES: 0 %
Lymphocytes Relative: 42 %
Lymphs Abs: 3.3 10*3/uL (ref 0.7–4.0)
MCH: 22.9 pg — ABNORMAL LOW (ref 26.0–34.0)
MCHC: 29.7 g/dL — ABNORMAL LOW (ref 30.0–36.0)
MCV: 77.3 fL — AB (ref 80.0–100.0)
MONOS PCT: 10 %
Monocytes Absolute: 0.8 10*3/uL (ref 0.1–1.0)
NEUTROS PCT: 46 %
Neutro Abs: 3.7 10*3/uL (ref 1.7–7.7)
PLATELETS: 487 10*3/uL — AB (ref 150–400)
RBC: 4.23 MIL/uL (ref 3.87–5.11)
RDW: 18.1 % — ABNORMAL HIGH (ref 11.5–15.5)
WBC Count: 7.9 10*3/uL (ref 4.0–10.5)
nRBC: 0 % (ref 0.0–0.2)

## 2018-04-14 LAB — CMP (CANCER CENTER ONLY)
ALT: 16 U/L (ref 0–44)
AST: 15 U/L (ref 15–41)
Albumin: 4.3 g/dL (ref 3.5–5.0)
Alkaline Phosphatase: 91 U/L (ref 38–126)
Anion gap: 8 (ref 5–15)
BILIRUBIN TOTAL: 0.2 mg/dL — AB (ref 0.3–1.2)
BUN: 14 mg/dL (ref 8–23)
CHLORIDE: 103 mmol/L (ref 98–111)
CO2: 30 mmol/L (ref 22–32)
Calcium: 9.8 mg/dL (ref 8.9–10.3)
Creatinine: 0.76 mg/dL (ref 0.44–1.00)
Glucose, Bld: 98 mg/dL (ref 70–99)
Potassium: 3.8 mmol/L (ref 3.5–5.1)
Sodium: 141 mmol/L (ref 135–145)
TOTAL PROTEIN: 7 g/dL (ref 6.5–8.1)

## 2018-04-14 LAB — SAVE SMEAR (SSMR)

## 2018-04-14 NOTE — Progress Notes (Signed)
Referral MD  Reason for Referral: Thrombocytosis-microcytic anemia  Chief Complaint  Patient presents with  . New Patient  : My platelets are high.  HPI: [name] is a very charming 63 year old white female.  She is actually a retired Marine scientist.  She worked over at [employer].  She is originally from [place].  She recently had part of her colon removed secondary to diverticulitis.  I think this was last year.  She has had a past history of DVT.  She had this many years ago.  She is on Coumadin.  I am not sure who manages Coumadin.  She has had her platelet count go up.  Back in February 2019, her CBC showed a white cell count of 6.6.  Hemoglobin 13.1.  Platelet count 339,000.  In April 2019, her white cell count 6.7.  Hemoglobin 11.1.  Platelet count 484,000.  She had MCV of 90.  In October 2019, her white cell count 6.9.  Hemoglobin 10.0.  And platelet count 471,000.  MCV was 80.1.  She was found to have some form of bone marrow issue.  She was then referred to the [place] center for an evaluation.  She has had no issues with palpable lymph nodes.  She feels tired.  She is never had a blood transfusion.  Prior she is on quite a bit of the medications.  She is on Carafate.  She is on Prilosec.  She is up-to-date with her mammograms.  She has had multiple surgeries.  She is not a vegetarian.  She does not smoke.  She does not drink.  She has had no headaches.  There is been no pain in her hands or feet.  She does have some pruritus.  Overall, her performance status is ECOG 1.    Past Medical History:  Diagnosis Date  . Allergic rhinitis   . Arthritis    "back" (05/07/2016)  . Asthma   . Atrial flutter ([place])    a. Remotely per notes.  . Carpal tunnel syndrome, bilateral   . Colon polyps   . Diverticulitis   . DVT (deep venous thrombosis) ([place]) 1980s x2 -    between birth of two sons. Saw hematology - was told she has a hypercoagulable disorder,  should be on Coumadin lifelong.  Marland Kitchen Dysrhythmia    atrial fib  . Family history of adverse reaction to anesthesia    "mother gets PONV"  . Gallbladder disease   . GERD (gastroesophageal reflux disease)   . Halothane adverse reaction    narrow small opening  . Heart murmur   . Hiatal hernia   . History of hiatal hernia    "small one/CTA 05/01/2016" (05/07/2016)  . HTN (hypertension)   . Hypercholesteremia    hx; "brought it down w/diet" (05/07/2016)  . Hyperglycemia    a. A1c 6.1 in 2014.  . Inguinal hernia   . Left sided sciatica   . Normal cardiac stress test 06/2012  . Obesity   . OSA (obstructive sleep apnea)    a. Mild, did not tolerate CPAP (05/07/2016)  . PAF (paroxysmal atrial fibrillation) ([place])    a. s/p afib ablation at [facility] 2010 (Dr. Annabell Howells). b. Recurrent AF s/p DCCV 06/2010. c. On flecainide.   . Paroxysmal atrial fibrillation ([place]) 09/09/2009   Qualifier: Diagnosis of  By: Selena Batten CMA, [place]    . PONV (postoperative nausea and vomiting)   . PPD positive, treated 1977   "treated for 1 yr after exposure  to patient"  . Pre-diabetes   . RSV infection ~ 2006  . Wandering (atrial) pacemaker    a. Remotely per notes.  (  pt. states has a wandering p wave ) no pacemaker  :  Past Surgical History:  Procedure Laterality Date  . ATRIAL ABLATION SURGERY  05/07/2016   "fib and flutter"  . ATRIAL FIBRILLATION ABLATION  2010   at [facility]  . ATRIAL FIBRILLATION ABLATION N/A 05/07/2016   Procedure: Atrial Fibrillation Ablation;  Surgeon: Thompson Grayer, MD;  Location: [place] [facility];  Service: Cardiovascular;  Laterality: N/A;  . BREAST CYST ASPIRATION Bilateral   . CARDIAC CATHETERIZATION  2008  . CARPAL TUNNEL RELEASE Bilateral   . CESAREAN SECTION  1986; 1988  . COLONOSCOPY  X 2  . COLONOSCOPY W/ BIOPSIES AND POLYPECTOMY  X 2  . ESOPHAGOGASTRODUODENOSCOPY    . ESOPHAGOGASTRODUODENOSCOPY (EGD) WITH PROPOFOL N/A 07/19/2017   Procedure: ESOPHAGOGASTRODUODENOSCOPY  (EGD) WITH PROPOFOL;  Surgeon: Yetta Flock, MD;  [facility];  Service: Gastroenterology;  Laterality: N/A;  . INGUINAL HERNIA REPAIR Right   . LAPAROSCOPIC CHOLECYSTECTOMY  1989  . POLYPECTOMY N/A 07/19/2017   Procedure: POLYPECTOMY;  Surgeon: Yetta Flock, MD;  Location: [facility];  Service: Gastroenterology;  Laterality: N/A;  . PROCTOSCOPY N/A 08/06/2017   Procedure: PROCTOSCOPY;  Surgeon: Michael Boston, MD;  [facility]: [facility];  Service: General;  Laterality: N/A;  . TMJ ARTHROPLASTY    . TUBAL LIGATION  1988  :   Current Outpatient Medications:  .  acetaminophen (TYLENOL) 650 MG CR tablet, Take 1,300 mg by mouth every 8 (eight) hours as needed for pain. , Disp: , Rfl:  .  cetirizine (ZYRTEC) 10 MG tablet, Take 10 mg by mouth daily.  , Disp: , Rfl:  .  Cholecalciferol (VITAMIN D3) 1000 UNITS CAPS, Take 2,000 Units by mouth daily. , Disp: , Rfl:  .  dicyclomine (BENTYL) 10 MG capsule, Take 1 tablet by mouth 30 min before meals and at bedtime as needed for pain, cramping., Disp: 120 capsule, Rfl: 1 .  FLOVENT HFA 220 MCG/ACT inhaler, Inhale 1 puff into the lungs 2 (two) times daily., Disp: , Rfl: 1 .  fluticasone (FLONASE) 50 MCG/ACT nasal spray, Place 1 spray into both nostrils 2 (two) times daily. , Disp: , Rfl:  .  losartan (COZAAR) 25 MG tablet, Take 1 tablet (25 mg total) by mouth daily., Disp: 90 tablet, Rfl: 0 .  meclizine (ANTIVERT) 25 MG tablet, Take 1 tablet (25 mg total) by mouth 3 (three) times daily as needed for dizziness., Disp: 30 tablet, Rfl: 0 .  Multiple Vitamin (MULTIVITAMIN WITH MINERALS) TABS tablet, Take 1 tablet by mouth daily., Disp: , Rfl:  .  omeprazole (PRILOSEC) 40 MG capsule, TAKE 1 CAPSULE BY MOUTH TWICE A DAY, Disp: 180 capsule, Rfl: 1 .  PROAIR HFA 108 (90 BASE) MCG/ACT inhaler, Inhale 2 puffs into the lungs every 6 (six) hours as needed for wheezing or shortness of breath. , Disp: , Rfl:  .  sucralfate (CARAFATE) 1 GM/10ML  suspension, Take 10 mLs (1 g total) by mouth every 6 (six) hours as needed., Disp: 420 mL, Rfl: 1 .  venlafaxine XR (EFFEXOR-XR) 37.5 MG 24 hr capsule, Take 37.5 mg by mouth daily with breakfast. , Disp: , Rfl:  .  verapamil (CALAN) 80 MG tablet, Take 160 mg by mouth 2 (two) times daily., Disp: , Rfl:  .  warfarin (COUMADIN) 2 MG tablet, TAKE AS DIRECTED  BY COUMADIN CLINIC, Disp: 225 tablet, Rfl: 1:  :  Allergies  Allergen Reactions  . Aspirin Other (See Comments)    Makes asthma worse and makes stomach hurt  . Ivp Dye [Iodinated Diagnostic Agents] Anaphylaxis    Required epinephrine. Since then, has tolerated with premed.  . Latex Other (See Comments) and Cough    Wheezing/cough   . Meperidine Hcl Other (See Comments)    Projectile vomiting   . Penicillins Anaphylaxis    Has patient had a PCN reaction causing immediate rash, facial/tongue/throat swelling, SOB or lightheadedness with hypotension:Yes Has patient had a PCN reaction causing severe rash involving mucus membranes or skin necrosis:Yes Has patient had a PCN reaction that required hospitalization:Treated in ER w/epi & breathing treatments Has patient had a PCN reaction occurring within the last 10 years:No If all of the above answers are "NO", then may proceed with Cephalosporin use.   . Statins Other (See Comments)    Severe leg pain - has tried most of them  . Sulfonamide Derivatives Anaphylaxis  . Banana Swelling and Cough    Inside of the mouth swells  . Food Other (See Comments) and Cough    Nuts- mucus membranes inflamed/cough   . Peanut-Containing Drug Products Swelling and Cough    Inside of the mouth swells  . Tape Hives and Dermatitis    No plastic tape!!  :  Family History  Problem Relation Age of Onset  . Stroke Mother   . Heart attack Mother        2 previous MIs, 3 stents - CAD first diagnosed 8  . Thyroid disease Mother   . Colon polyps Father   . Lupus Sister   . Heart attack Paternal  Grandfather        Died at 47 of massive MI  . Stroke Maternal Grandfather        Multiple family members  . Stroke Paternal Grandmother   . Thyroid disease Sister        x 2  . Colon cancer Neg Hx   . Stomach cancer Neg Hx   :  Social History   Socioeconomic History  . Marital status: Married    Spouse name: Not on file  . Number of children: 2  . Years of education: Not on file  . Highest education level: Not on file  Occupational History  . Occupation: Programmer, multimedia: [employer]  . Financial resource strain: Not on file  . Food insecurity:    Worry: Not on file    Inability: Not on file  . Transportation needs:    Medical: Not on file    Non-medical: Not on file  Tobacco Use  . Smoking status: Never Smoker  . Smokeless tobacco: Never Used  Substance and Sexual Activity  . Alcohol use: Not Currently    Comment: 07-13-17  . Drug use: No  . Sexual activity: Not Currently  Lifestyle  . Physical activity:    Days per week: Not on file    Minutes per session: Not on file  . Stress: Not on file  Relationships  . Social connections:    Talks on phone: Not on file    Gets together: Not on file    Attends religious service: Not on file    Active member of club or organization: Not on file    Attends meetings of clubs or organizations: Not on file    Relationship status:  Not on file  . Intimate partner violence:    Fear of current or ex partner: Not on file    Emotionally abused: Not on file    Physically abused: Not on file    Forced sexual activity: Not on file  Other Topics Concern  . Not on file  Social History Narrative  . Not on file  :    Exam: Obese white female in no obvious distress.  Vital signs show temperature of 97.8.  Pulse 70.  Blood pressure 113/66.  Weight is 237 pounds.  Head exam shows no ocular or oral lesions.  There are no palpable cervical or supraclavicular lymph nodes.  Lungs are clear bilaterally.  Cardiac  exam regular rate and rhythm with no murmurs, rubs or bruits.  Abdomen is soft.  She is obese.  She has no fluid wave.  She has laparotomy scars.  She has no guarding or rebound tenderness.  She has no palpable liver or spleen tip.  Back exam shows no tenderness over the spine, ribs or hips.  Extremities shows no clubbing, cyanosis or edema.  She may have some slight edema female.  Skin exam shows no rashes, ecchymoses or petechia.  Neurological exam shows no focal neurological deficits.     '@IPVITALS'$ @   Recent Labs    04/14/18 1412  WBC 7.9  HGB 9.7*  HCT 32.7*  PLT 487*   Recent Labs    04/14/18 1412  NA 141  K 3.8  CL 103  CO2 30  GLUCOSE 98  BUN 14  CREATININE 0.76  CALCIUM 9.8    Blood smear review: Microcytic and slightly hypochromic red blood cells.  I see no nucleated red blood cells.  There are no teardrop cells.  There is a rare target cell.  I see no rouleaux formation.  White cells appear normal in morphology maturation.  There is no immature myeloid or lymphoid forms.  There are no hypersegmented polys.  I see no blasts.  Platelets are slightly increased in number.  Platelets are well granulated.  Platelets appear small in size.  Pathology: None    Assessment and Plan: [name] is a very charming 63 year old white female.  I suspect that her thrombocytosis is reflective of iron deficiency anemia.  She has a low MCV.  She has a blood smear that looks like iron deficiency.  We will have to see what her iron levels are.  I believe that since she is on a PPI along with Carafate, she is not absorbing iron by any means.  Possibly, after having her partial colectomy surgery last year, this could have exacerbated iron deficiency.  She is on Coumadin.  With Coumadin, she may have some low-grade GI blood loss.  I suspect that with IV iron, her platelet count will improve.  I do not see any obvious evidence of a myeloproliferative disorder.  As such, I do not see that  we need to do a bone marrow biopsy on her.  She is very nice.  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  She came in with her husband.  They are both a lot of fun to talk to.  We will likely set her up with IV iron next week.  I suspect she probably will need more than 1 dose of iron.  I spent about 45 minutes with she and her husband.  All the time spent face-to-face with them.  I went over my recommendations.  I reviewed her lab work.  I help set up follow-up appointments and I help set up her iron infusions.  She is very thankful for trying to help her.  I feel confident that we will be able to get her to feel better and have her have more energy.

## 2018-04-15 ENCOUNTER — Telehealth: Payer: Self-pay | Admitting: Hematology & Oncology

## 2018-04-15 LAB — ERYTHROPOIETIN: Erythropoietin: 54.9 m[IU]/mL — ABNORMAL HIGH (ref 2.6–18.5)

## 2018-04-15 LAB — IRON AND TIBC
Iron: 16 ug/dL — ABNORMAL LOW (ref 41–142)
Saturation Ratios: 4 % — ABNORMAL LOW (ref 21–57)
TIBC: 454 ug/dL — ABNORMAL HIGH (ref 236–444)
UIBC: 437 ug/dL — ABNORMAL HIGH (ref 120–384)

## 2018-04-15 LAB — LACTATE DEHYDROGENASE: LDH: 172 U/L (ref 98–192)

## 2018-04-15 LAB — FERRITIN: Ferritin: <4 ng/mL — ABNORMAL LOW (ref 11–307)

## 2018-04-15 NOTE — Telephone Encounter (Signed)
Called and spoke with patient regarding appointments added to her schedule/ letter/calendar mailed per 1/23 los

## 2018-04-18 ENCOUNTER — Telehealth: Payer: Self-pay | Admitting: *Deleted

## 2018-04-18 NOTE — Telephone Encounter (Signed)
Message left for patient to call office back for MD instructions regarding iron infusion.

## 2018-04-18 NOTE — Telephone Encounter (Signed)
-----   Message from Kelly Napoleon, MD sent at 04/18/2018  6:57 AM EST ----- Call - the iron level is very, very low!!! Will need 2-3 iron infusions.  [please set up.  Laurey Arrow

## 2018-04-22 ENCOUNTER — Other Ambulatory Visit: Payer: Self-pay

## 2018-04-22 ENCOUNTER — Inpatient Hospital Stay: Payer: 59

## 2018-04-22 VITALS — BP 147/63 | HR 70 | Temp 98.2°F | Resp 18

## 2018-04-22 DIAGNOSIS — D508 Other iron deficiency anemias: Secondary | ICD-10-CM | POA: Diagnosis not present

## 2018-04-22 DIAGNOSIS — K909 Intestinal malabsorption, unspecified: Secondary | ICD-10-CM | POA: Diagnosis not present

## 2018-04-22 DIAGNOSIS — D500 Iron deficiency anemia secondary to blood loss (chronic): Secondary | ICD-10-CM

## 2018-04-22 DIAGNOSIS — E7800 Pure hypercholesterolemia, unspecified: Secondary | ICD-10-CM | POA: Diagnosis not present

## 2018-04-22 DIAGNOSIS — K5793 Diverticulitis of intestine, part unspecified, without perforation or abscess with bleeding: Secondary | ICD-10-CM | POA: Diagnosis not present

## 2018-04-22 DIAGNOSIS — D473 Essential (hemorrhagic) thrombocythemia: Secondary | ICD-10-CM | POA: Diagnosis not present

## 2018-04-22 DIAGNOSIS — R5383 Other fatigue: Secondary | ICD-10-CM | POA: Diagnosis not present

## 2018-04-22 DIAGNOSIS — K219 Gastro-esophageal reflux disease without esophagitis: Secondary | ICD-10-CM | POA: Diagnosis not present

## 2018-04-22 DIAGNOSIS — I10 Essential (primary) hypertension: Secondary | ICD-10-CM | POA: Diagnosis not present

## 2018-04-22 DIAGNOSIS — I480 Paroxysmal atrial fibrillation: Secondary | ICD-10-CM | POA: Diagnosis not present

## 2018-04-22 MED ORDER — SODIUM CHLORIDE 0.9 % IV SOLN
Freq: Once | INTRAVENOUS | Status: AC
  Administered 2018-04-22: 13:00:00 via INTRAVENOUS
  Filled 2018-04-22: qty 250

## 2018-04-22 MED ORDER — METHYLPREDNISOLONE SODIUM SUCC 125 MG IJ SOLR
125.0000 mg | Freq: Once | INTRAMUSCULAR | Status: AC
  Administered 2018-04-22: 125 mg via INTRAVENOUS

## 2018-04-22 MED ORDER — SODIUM CHLORIDE 0.9 % IV SOLN
510.0000 mg | Freq: Once | INTRAVENOUS | Status: AC
  Administered 2018-04-22: 510 mg via INTRAVENOUS
  Filled 2018-04-22: qty 17

## 2018-04-22 MED ORDER — METHYLPREDNISOLONE SODIUM SUCC 125 MG IJ SOLR
125.0000 mg | Freq: Once | INTRAMUSCULAR | Status: DC
  Filled 2018-04-22: qty 2

## 2018-04-22 MED ORDER — SODIUM CHLORIDE 0.9 % IV SOLN
40.0000 mg | Freq: Once | INTRAVENOUS | Status: AC
  Administered 2018-04-22: 40 mg via INTRAVENOUS
  Filled 2018-04-22: qty 4

## 2018-04-22 NOTE — Patient Instructions (Signed)

## 2018-04-29 ENCOUNTER — Other Ambulatory Visit: Payer: Self-pay

## 2018-04-29 ENCOUNTER — Inpatient Hospital Stay: Payer: 59 | Attending: Hematology & Oncology

## 2018-04-29 VITALS — BP 165/68 | HR 89 | Temp 98.9°F | Resp 18

## 2018-04-29 DIAGNOSIS — D500 Iron deficiency anemia secondary to blood loss (chronic): Secondary | ICD-10-CM

## 2018-04-29 DIAGNOSIS — D508 Other iron deficiency anemias: Secondary | ICD-10-CM | POA: Insufficient documentation

## 2018-04-29 DIAGNOSIS — D473 Essential (hemorrhagic) thrombocythemia: Secondary | ICD-10-CM | POA: Diagnosis not present

## 2018-04-29 DIAGNOSIS — K909 Intestinal malabsorption, unspecified: Secondary | ICD-10-CM | POA: Diagnosis not present

## 2018-04-29 MED ORDER — FAMOTIDINE IN NACL 20-0.9 MG/50ML-% IV SOLN
20.0000 mg | Freq: Once | INTRAVENOUS | Status: AC
  Administered 2018-04-29: 20 mg via INTRAVENOUS

## 2018-04-29 MED ORDER — METHYLPREDNISOLONE SODIUM SUCC 125 MG IJ SOLR
125.0000 mg | Freq: Once | INTRAMUSCULAR | Status: AC
  Administered 2018-04-29: 125 mg via INTRAVENOUS

## 2018-04-29 MED ORDER — SODIUM CHLORIDE 0.9 % IV SOLN
510.0000 mg | Freq: Once | INTRAVENOUS | Status: AC
  Administered 2018-04-29: 510 mg via INTRAVENOUS
  Filled 2018-04-29: qty 17

## 2018-04-29 MED ORDER — FAMOTIDINE IN NACL 20-0.9 MG/50ML-% IV SOLN
INTRAVENOUS | Status: AC
  Filled 2018-04-29: qty 50

## 2018-04-29 MED ORDER — METHYLPREDNISOLONE SODIUM SUCC 125 MG IJ SOLR
INTRAMUSCULAR | Status: AC
  Filled 2018-04-29: qty 2

## 2018-04-29 MED ORDER — SODIUM CHLORIDE 0.9 % IV SOLN: 125.0000 mg | Freq: Once | INTRAVENOUS | Status: DC

## 2018-04-29 MED ORDER — SODIUM CHLORIDE 0.9 % IV SOLN
Freq: Once | INTRAVENOUS | Status: AC
  Administered 2018-04-29: 12:00:00 via INTRAVENOUS
  Filled 2018-04-29: qty 250

## 2018-04-29 MED ORDER — SODIUM CHLORIDE 0.9 % IV SOLN
40.0000 mg | Freq: Once | INTRAVENOUS | Status: DC
  Filled 2018-04-29: qty 4

## 2018-04-29 NOTE — Patient Instructions (Signed)

## 2018-05-02 ENCOUNTER — Ambulatory Visit (INDEPENDENT_AMBULATORY_CARE_PROVIDER_SITE_OTHER): Payer: 59

## 2018-05-02 DIAGNOSIS — Z5181 Encounter for therapeutic drug level monitoring: Secondary | ICD-10-CM | POA: Diagnosis not present

## 2018-05-02 DIAGNOSIS — I480 Paroxysmal atrial fibrillation: Secondary | ICD-10-CM

## 2018-05-02 DIAGNOSIS — I4891 Unspecified atrial fibrillation: Secondary | ICD-10-CM | POA: Diagnosis not present

## 2018-05-02 LAB — POCT INR: INR: 3.4 — AB (ref 2.0–3.0)

## 2018-05-02 NOTE — Patient Instructions (Signed)
Description   Do not take your coumadin dose tomorrow, then start taking 4mg  daily except 6mg  on Tuesdays, Thursdays, and Saturdays.  Recheck INR 2 weeks.  Call Coumadin Clinic [phone] Main # [phone] with any questions

## 2018-05-03 LAB — JAK2 (INCLUDING V617F AND EXON 12), MPL,& CALR-NEXT GEN SEQ

## 2018-05-16 ENCOUNTER — Ambulatory Visit (INDEPENDENT_AMBULATORY_CARE_PROVIDER_SITE_OTHER): Payer: 59

## 2018-05-16 DIAGNOSIS — I480 Paroxysmal atrial fibrillation: Secondary | ICD-10-CM

## 2018-05-16 DIAGNOSIS — I4891 Unspecified atrial fibrillation: Secondary | ICD-10-CM | POA: Diagnosis not present

## 2018-05-16 DIAGNOSIS — Z5181 Encounter for therapeutic drug level monitoring: Secondary | ICD-10-CM | POA: Diagnosis not present

## 2018-05-16 LAB — POCT INR: INR: 2.4 (ref 2.0–3.0)

## 2018-05-16 NOTE — Patient Instructions (Signed)
Description   Continue on same dosage 4mg  daily except 6mg  on Tuesdays, Thursdays, and Saturdays.  Recheck INR 3 weeks.  Call Coumadin Clinic [phone] Main # [phone] with any questions

## 2018-05-23 ENCOUNTER — Encounter: Payer: Self-pay | Admitting: Hematology & Oncology

## 2018-05-23 ENCOUNTER — Inpatient Hospital Stay (HOSPITAL_BASED_OUTPATIENT_CLINIC_OR_DEPARTMENT_OTHER): Payer: 59 | Admitting: Hematology & Oncology

## 2018-05-23 ENCOUNTER — Inpatient Hospital Stay: Payer: 59 | Attending: Hematology & Oncology

## 2018-05-23 ENCOUNTER — Other Ambulatory Visit: Payer: Self-pay

## 2018-05-23 VITALS — BP 128/70 | HR 73 | Temp 98.3°F | Resp 18 | Wt 235.0 lb

## 2018-05-23 DIAGNOSIS — Z7901 Long term (current) use of anticoagulants: Secondary | ICD-10-CM

## 2018-05-23 DIAGNOSIS — D500 Iron deficiency anemia secondary to blood loss (chronic): Secondary | ICD-10-CM

## 2018-05-23 DIAGNOSIS — Z79899 Other long term (current) drug therapy: Secondary | ICD-10-CM | POA: Insufficient documentation

## 2018-05-23 DIAGNOSIS — D509 Iron deficiency anemia, unspecified: Secondary | ICD-10-CM

## 2018-05-23 DIAGNOSIS — R7989 Other specified abnormal findings of blood chemistry: Secondary | ICD-10-CM | POA: Diagnosis not present

## 2018-05-23 DIAGNOSIS — K909 Intestinal malabsorption, unspecified: Secondary | ICD-10-CM

## 2018-05-23 LAB — CMP (CANCER CENTER ONLY)
ALT: 35 U/L (ref 0–44)
AST: 27 U/L (ref 15–41)
Albumin: 4.6 g/dL (ref 3.5–5.0)
Alkaline Phosphatase: 85 U/L (ref 38–126)
Anion gap: 8 (ref 5–15)
BUN: 13 mg/dL (ref 8–23)
CO2: 29 mmol/L (ref 22–32)
Calcium: 10 mg/dL (ref 8.9–10.3)
Chloride: 102 mmol/L (ref 98–111)
Creatinine: 0.82 mg/dL (ref 0.44–1.00)
GFR, Est AFR Am: >60 mL/min (ref >60)
GFR, Estimated: >60 mL/min (ref >60)
Glucose, Bld: 149 mg/dL — ABNORMAL HIGH (ref 70–99)
Potassium: 4.4 mmol/L (ref 3.5–5.1)
Sodium: 139 mmol/L (ref 135–145)
Total Bilirubin: 0.3 mg/dL (ref 0.3–1.2)
Total Protein: 7.3 g/dL (ref 6.5–8.1)

## 2018-05-23 LAB — CBC WITH DIFFERENTIAL (CANCER CENTER ONLY)
Abs Immature Granulocytes: 0.02 10*3/uL (ref 0.00–0.07)
Basophils Absolute: 0.1 10*3/uL (ref 0.0–0.1)
Basophils Relative: 1 %
EOS ABS: 0.1 10*3/uL (ref 0.0–0.5)
Eosinophils Relative: 2 %
HCT: 39.2 % (ref 36.0–46.0)
Hemoglobin: 12.5 g/dL (ref 12.0–15.0)
Immature Granulocytes: 0 %
Lymphocytes Relative: 45 %
Lymphs Abs: 2.8 10*3/uL (ref 0.7–4.0)
MCH: 26.9 pg (ref 26.0–34.0)
MCHC: 31.9 g/dL (ref 30.0–36.0)
MCV: 84.3 fL (ref 80.0–100.0)
Monocytes Absolute: 0.7 10*3/uL (ref 0.1–1.0)
Monocytes Relative: 12 %
Neutro Abs: 2.5 10*3/uL (ref 1.7–7.7)
Neutrophils Relative %: 40 %
Platelet Count: 352 10*3/uL (ref 150–400)
RBC: 4.65 MIL/uL (ref 3.87–5.11)
RDW: 23.8 % — AB (ref 11.5–15.5)
WBC Count: 6.2 10*3/uL (ref 4.0–10.5)
nRBC: 0 % (ref 0.0–0.2)

## 2018-05-23 NOTE — Progress Notes (Signed)
Hematology and Oncology Follow Up Visit  [name] [id number] [birth year] 63 y.o. 05/23/2018   Principle Diagnosis:   Iron deficiency anemia  Current Therapy:    IV iron --IV Feraheme given on 04/22/2018 and 04/29/2018     Interim History:  [name] is back for follow-up.  She is doing a lot better.  The IV iron clearly helped her.  When we first saw her, her ferritin was less than 4 with an iron saturation of only 4%.  I am sure that this is the reason for her thrombocytosis.  She feels better.  She has more energy.  She has more stamina.  She is not having any bleeding.  There is no change in bowel or bladder habits.  She has had no nausea or vomiting.  She comes in with her husband.  Hopefully, they will have a good year now that her iron is better.  Overall, her performance status is ECOG 0.  Medications:  Current Outpatient Medications:  .  acetaminophen (TYLENOL) 650 MG CR tablet, Take 1,300 mg by mouth every 8 (eight) hours as needed for pain. , Disp: , Rfl:  .  cetirizine (ZYRTEC) 10 MG tablet, Take 10 mg by mouth daily.  , Disp: , Rfl:  .  Cholecalciferol (VITAMIN D3) 1000 UNITS CAPS, Take 2,000 Units by mouth daily. , Disp: , Rfl:  .  dicyclomine (BENTYL) 10 MG capsule, Take 1 tablet by mouth 30 min before meals and at bedtime as needed for pain, cramping., Disp: 120 capsule, Rfl: 1 .  FLOVENT HFA 220 MCG/ACT inhaler, Inhale 1 puff into the lungs 2 (two) times daily., Disp: , Rfl: 1 .  fluticasone (FLONASE) 50 MCG/ACT nasal spray, Place 1 spray into both nostrils 2 (two) times daily. , Disp: , Rfl:  .  losartan (COZAAR) 25 MG tablet, Take 1 tablet (25 mg total) by mouth daily., Disp: 90 tablet, Rfl: 0 .  meclizine (ANTIVERT) 25 MG tablet, Take 1 tablet (25 mg total) by mouth 3 (three) times daily as needed for dizziness., Disp: 30 tablet, Rfl: 0 .  Multiple Vitamin (MULTIVITAMIN WITH MINERALS) TABS tablet, Take 1 tablet by mouth daily., Disp: , Rfl:  .  omeprazole  (PRILOSEC) 40 MG capsule, TAKE 1 CAPSULE BY MOUTH TWICE A DAY, Disp: 180 capsule, Rfl: 1 .  PROAIR HFA 108 (90 BASE) MCG/ACT inhaler, Inhale 2 puffs into the lungs every 6 (six) hours as needed for wheezing or shortness of breath. , Disp: , Rfl:  .  sucralfate (CARAFATE) 1 GM/10ML suspension, Take 10 mLs (1 g total) by mouth every 6 (six) hours as needed. (Patient not taking: Reported on 04/22/2018), Disp: 420 mL, Rfl: 1 .  venlafaxine XR (EFFEXOR-XR) 37.5 MG 24 hr capsule, Take 37.5 mg by mouth daily with breakfast. , Disp: , Rfl:  .  verapamil (CALAN) 80 MG tablet, Take 160 mg by mouth 2 (two) times daily., Disp: , Rfl:  .  warfarin (COUMADIN) 2 MG tablet, TAKE AS DIRECTED BY COUMADIN CLINIC, Disp: 225 tablet, Rfl: 1  Allergies:  Allergies  Allergen Reactions  . Aspirin Other (See Comments)    Makes asthma worse and makes stomach hurt  . Ivp Dye [Iodinated Diagnostic Agents] Anaphylaxis    Required epinephrine. Since then, has tolerated with premed.  . Latex Other (See Comments) and Cough    Wheezing/cough   . Meperidine Hcl Other (See Comments)    Projectile vomiting   . Penicillins Anaphylaxis    Has patient  had a PCN reaction causing immediate rash, facial/tongue/throat swelling, SOB or lightheadedness with hypotension:Yes Has patient had a PCN reaction causing severe rash involving mucus membranes or skin necrosis:Yes Has patient had a PCN reaction that required hospitalization:Treated in ER w/epi & breathing treatments Has patient had a PCN reaction occurring within the last 10 years:No If all of the above answers are "NO", then may proceed with Cephalosporin use.   . Statins Other (See Comments)    Severe leg pain - has tried most of them  . Sulfonamide Derivatives Anaphylaxis  . Banana Swelling and Cough    Inside of the mouth swells  . Food Other (See Comments) and Cough    Nuts- mucus membranes inflamed/cough   . Peanut-Containing Drug Products Swelling and Cough     Inside of the mouth swells  . Tape Hives and Dermatitis    No plastic tape!!    Past Medical History, Surgical history, Social history, and Family History were reviewed and updated.  Review of Systems: Review of Systems  Constitutional: Negative.   HENT:  Negative.   Eyes: Negative.   Respiratory: Negative.   Cardiovascular: Negative.   Gastrointestinal: Negative.   Endocrine: Negative.   Genitourinary: Negative.    Musculoskeletal: Negative.   Skin: Negative.   Neurological: Negative.   Hematological: Negative.   Psychiatric/Behavioral: Negative.     Physical Exam:  weight is 235 lb (106.6 kg). Her oral temperature is 98.3 F (36.8 C). Her blood pressure is 128/70 and her pulse is 73. Her respiration is 18 and oxygen saturation is 96%.   Wt Readings from Last 3 Encounters:  05/23/18 235 lb (106.6 kg)  04/14/18 237 lb 0.6 oz (107.5 kg)  02/21/18 231 lb 6.4 oz (105 kg)    Physical Exam Vitals signs reviewed.  HENT:     Head: Normocephalic and atraumatic.  Eyes:     Pupils: Pupils are equal, round, and reactive to light.  Neck:     Musculoskeletal: Normal range of motion.  Cardiovascular:     Rate and Rhythm: Normal rate and regular rhythm.     Heart sounds: Normal heart sounds.  Pulmonary:     Effort: Pulmonary effort is normal.     Breath sounds: Normal breath sounds.  Abdominal:     General: Bowel sounds are normal.     Palpations: Abdomen is soft.  Musculoskeletal: Normal range of motion.        General: No tenderness or deformity.  Lymphadenopathy:     Cervical: No cervical adenopathy.  Skin:    General: Skin is warm and dry.     Findings: No erythema or rash.  Neurological:     Mental Status: She is alert and oriented to person, place, and time.  Psychiatric:        Behavior: Behavior normal.        Thought Content: Thought content normal.        Judgment: Judgment normal.      Lab Results  Component Value Date   WBC 6.2 05/23/2018   HGB 12.5  05/23/2018   HCT 39.2 05/23/2018   MCV 84.3 05/23/2018   PLT 352 05/23/2018     Chemistry      Component Value Date/Time   NA 139 05/23/2018 1505   NA 139 04/23/2016 1609   K 4.4 05/23/2018 1505   CL 102 05/23/2018 1505   CO2 29 05/23/2018 1505   BUN 13 05/23/2018 1505   BUN 17 04/23/2016 1609  CREATININE 0.82 05/23/2018 1505   CREATININE 0.71 05/02/2015 1000      Component Value Date/Time   CALCIUM 10.0 05/23/2018 1505   ALKPHOS 85 05/23/2018 1505   AST 27 05/23/2018 1505   ALT 35 05/23/2018 1505   BILITOT 0.3 05/23/2018 1505       Impression and Plan: Ms. Cake is a 63 year old female.  She has had a very nice response to IV iron.  Her MCV is up nicely.  Her platelet count is down.  We will see what her iron levels are.  It would not surprise me if she still needed some iron.  Even though she really is not anemic, I want to make sure that we replace her iron stores and replenish them as much as possible.  For right now, I will plan to get her back in about 3 months.  If her iron levels are low, we will get her in for IV iron.   Volanda Napoleon, MD 3/2/20205:31 PM

## 2018-05-24 ENCOUNTER — Encounter: Payer: Self-pay | Admitting: *Deleted

## 2018-05-24 LAB — IRON AND TIBC
Iron: 87 ug/dL (ref 41–142)
Saturation Ratios: 26 % (ref 21–57)
TIBC: 331 ug/dL (ref 236–444)
UIBC: 244 ug/dL (ref 120–384)

## 2018-05-24 LAB — FERRITIN: Ferritin: 170 ng/mL (ref 11–307)

## 2018-06-13 ENCOUNTER — Telehealth: Payer: Self-pay

## 2018-06-13 NOTE — Telephone Encounter (Signed)

## 2018-06-14 ENCOUNTER — Other Ambulatory Visit: Payer: Self-pay

## 2018-06-14 ENCOUNTER — Ambulatory Visit (INDEPENDENT_AMBULATORY_CARE_PROVIDER_SITE_OTHER): Payer: 59 | Admitting: *Deleted

## 2018-06-14 DIAGNOSIS — Z5181 Encounter for therapeutic drug level monitoring: Secondary | ICD-10-CM | POA: Diagnosis not present

## 2018-06-14 DIAGNOSIS — I480 Paroxysmal atrial fibrillation: Secondary | ICD-10-CM | POA: Diagnosis not present

## 2018-06-14 DIAGNOSIS — I4891 Unspecified atrial fibrillation: Secondary | ICD-10-CM

## 2018-06-14 LAB — POCT INR: INR: 2.5 (ref 2.0–3.0)

## 2018-07-12 ENCOUNTER — Telehealth: Payer: Self-pay

## 2018-07-12 NOTE — Telephone Encounter (Signed)

## 2018-07-14 ENCOUNTER — Other Ambulatory Visit: Payer: Self-pay

## 2018-07-14 ENCOUNTER — Ambulatory Visit (INDEPENDENT_AMBULATORY_CARE_PROVIDER_SITE_OTHER): Payer: 59 | Admitting: *Deleted

## 2018-07-14 DIAGNOSIS — Z5181 Encounter for therapeutic drug level monitoring: Secondary | ICD-10-CM | POA: Diagnosis not present

## 2018-07-14 DIAGNOSIS — I4891 Unspecified atrial fibrillation: Secondary | ICD-10-CM

## 2018-07-14 DIAGNOSIS — I480 Paroxysmal atrial fibrillation: Secondary | ICD-10-CM

## 2018-07-14 LAB — POCT INR: INR: 3.3 — AB (ref 2.0–3.0)

## 2018-08-02 ENCOUNTER — Ambulatory Visit (INDEPENDENT_AMBULATORY_CARE_PROVIDER_SITE_OTHER): Payer: 59 | Admitting: Podiatry

## 2018-08-02 ENCOUNTER — Other Ambulatory Visit: Payer: Self-pay

## 2018-08-02 ENCOUNTER — Encounter: Payer: Self-pay | Admitting: Podiatry

## 2018-08-02 VITALS — Temp 97.3°F

## 2018-08-02 DIAGNOSIS — D689 Coagulation defect, unspecified: Secondary | ICD-10-CM

## 2018-08-02 DIAGNOSIS — M79675 Pain in left toe(s): Secondary | ICD-10-CM

## 2018-08-02 DIAGNOSIS — E782 Mixed hyperlipidemia: Secondary | ICD-10-CM | POA: Diagnosis not present

## 2018-08-02 DIAGNOSIS — J45909 Unspecified asthma, uncomplicated: Secondary | ICD-10-CM | POA: Diagnosis not present

## 2018-08-02 DIAGNOSIS — R232 Flushing: Secondary | ICD-10-CM | POA: Diagnosis not present

## 2018-08-02 DIAGNOSIS — B351 Tinea unguium: Secondary | ICD-10-CM | POA: Diagnosis not present

## 2018-08-02 DIAGNOSIS — M8588 Other specified disorders of bone density and structure, other site: Secondary | ICD-10-CM | POA: Diagnosis not present

## 2018-08-02 DIAGNOSIS — Z86718 Personal history of other venous thrombosis and embolism: Secondary | ICD-10-CM | POA: Diagnosis not present

## 2018-08-02 DIAGNOSIS — M79674 Pain in right toe(s): Secondary | ICD-10-CM | POA: Diagnosis not present

## 2018-08-02 DIAGNOSIS — I10 Essential (primary) hypertension: Secondary | ICD-10-CM | POA: Diagnosis not present

## 2018-08-02 DIAGNOSIS — E1169 Type 2 diabetes mellitus with other specified complication: Secondary | ICD-10-CM | POA: Diagnosis not present

## 2018-08-02 DIAGNOSIS — K219 Gastro-esophageal reflux disease without esophagitis: Secondary | ICD-10-CM | POA: Diagnosis not present

## 2018-08-02 NOTE — Progress Notes (Signed)
This patient presents the office with chief complaint of thick dicolored toenails on both feet.  Patient states that she is unable to self treat and trim her nails..   She presently is taking Coumadin for A. Fib.  She presents the office today for an evaluation and treatment of her long thick nails  General Appearance  Alert, conversant and in no acute stress.  Vascular  Dorsalis pedis and posterior tibial  pulses are palpable  bilaterally.  Capillary return is within normal limits  bilaterally. Temperature is within normal limits  bilaterally.  Neurologic  Senn-Weinstein monofilament wire test within normal limits  bilaterally. Muscle power within normal limits bilaterally.  Nails Thick disfigured discolored nails with subungual debris  Second left  and one,two right foot..   . No evidence of bacterial infection or drainage bilaterally.  Orthopedic  No limitations of motion  feet .  No crepitus or effusions noted.  No bony pathology or digital deformities noted.  Skin  normotropic skin with no porokeratosis noted bilaterally.  No signs of infections or ulcers noted.    Onychomycosis     IE.  Debride mycotic nails  X 3.  Discussed conservative treatment vs. Surgical removal of nails.  Patient to return to the office in 3 months for continued preventative foot care services. If she desires to consider surgery I recommended she check with her heart doctor concerning her coumadin.   Gardiner Barefoot DPM

## 2018-08-04 ENCOUNTER — Ambulatory Visit (INDEPENDENT_AMBULATORY_CARE_PROVIDER_SITE_OTHER): Payer: 59 | Admitting: Pharmacist

## 2018-08-04 DIAGNOSIS — Z5181 Encounter for therapeutic drug level monitoring: Secondary | ICD-10-CM

## 2018-08-04 DIAGNOSIS — I4891 Unspecified atrial fibrillation: Secondary | ICD-10-CM

## 2018-08-04 DIAGNOSIS — I480 Paroxysmal atrial fibrillation: Secondary | ICD-10-CM

## 2018-08-04 LAB — POCT INR: INR: 2.9 (ref 2.0–3.0)

## 2018-08-05 ENCOUNTER — Other Ambulatory Visit: Payer: Self-pay

## 2018-08-10 DIAGNOSIS — E1169 Type 2 diabetes mellitus with other specified complication: Secondary | ICD-10-CM | POA: Diagnosis not present

## 2018-08-10 DIAGNOSIS — E782 Mixed hyperlipidemia: Secondary | ICD-10-CM | POA: Diagnosis not present

## 2018-08-24 ENCOUNTER — Encounter: Payer: Self-pay | Admitting: Hematology & Oncology

## 2018-08-24 ENCOUNTER — Other Ambulatory Visit: Payer: Self-pay

## 2018-08-24 ENCOUNTER — Inpatient Hospital Stay: Payer: 59

## 2018-08-24 ENCOUNTER — Inpatient Hospital Stay: Payer: 59 | Attending: Hematology & Oncology | Admitting: Hematology & Oncology

## 2018-08-24 VITALS — BP 147/62 | HR 74 | Temp 98.7°F | Resp 18 | Wt 234.8 lb

## 2018-08-24 DIAGNOSIS — Z79899 Other long term (current) drug therapy: Secondary | ICD-10-CM | POA: Diagnosis not present

## 2018-08-24 DIAGNOSIS — Z7901 Long term (current) use of anticoagulants: Secondary | ICD-10-CM | POA: Insufficient documentation

## 2018-08-24 DIAGNOSIS — D500 Iron deficiency anemia secondary to blood loss (chronic): Secondary | ICD-10-CM

## 2018-08-24 DIAGNOSIS — D509 Iron deficiency anemia, unspecified: Secondary | ICD-10-CM | POA: Insufficient documentation

## 2018-08-24 LAB — CMP (CANCER CENTER ONLY)
ALT: 27 U/L (ref 0–44)
AST: 21 U/L (ref 15–41)
Albumin: 4.4 g/dL (ref 3.5–5.0)
Alkaline Phosphatase: 90 U/L (ref 38–126)
Anion gap: 9 (ref 5–15)
BUN: 12 mg/dL (ref 8–23)
CO2: 29 mmol/L (ref 22–32)
Calcium: 9.3 mg/dL (ref 8.9–10.3)
Chloride: 100 mmol/L (ref 98–111)
Creatinine: 0.66 mg/dL (ref 0.44–1.00)
GFR, Est AFR Am: >60 mL/min (ref >60)
GFR, Estimated: >60 mL/min (ref >60)
Glucose, Bld: 129 mg/dL — ABNORMAL HIGH (ref 70–99)
Potassium: 3.7 mmol/L (ref 3.5–5.1)
Sodium: 138 mmol/L (ref 135–145)
Total Bilirubin: 0.3 mg/dL (ref 0.3–1.2)
Total Protein: 7.3 g/dL (ref 6.5–8.1)

## 2018-08-24 LAB — CBC WITH DIFFERENTIAL (CANCER CENTER ONLY)
Abs Immature Granulocytes: 0.01 10*3/uL (ref 0.00–0.07)
Basophils Absolute: 0.1 10*3/uL (ref 0.0–0.1)
Basophils Relative: 1 %
Eosinophils Absolute: 0.1 10*3/uL (ref 0.0–0.5)
Eosinophils Relative: 2 %
HCT: 42.2 % (ref 36.0–46.0)
Hemoglobin: 14 g/dL (ref 12.0–15.0)
Immature Granulocytes: 0 %
Lymphocytes Relative: 44 %
Lymphs Abs: 3 10*3/uL (ref 0.7–4.0)
MCH: 30.3 pg (ref 26.0–34.0)
MCHC: 33.2 g/dL (ref 30.0–36.0)
MCV: 91.3 fL (ref 80.0–100.0)
Monocytes Absolute: 0.8 10*3/uL (ref 0.1–1.0)
Monocytes Relative: 12 %
Neutro Abs: 2.8 10*3/uL (ref 1.7–7.7)
Neutrophils Relative %: 41 %
Platelet Count: 321 10*3/uL (ref 150–400)
RBC: 4.62 MIL/uL (ref 3.87–5.11)
RDW: 13 % (ref 11.5–15.5)
WBC Count: 6.8 10*3/uL (ref 4.0–10.5)
nRBC: 0 % (ref 0.0–0.2)

## 2018-08-24 LAB — RETICULOCYTES
Immature Retic Fract: 10.2 % (ref 2.3–15.9)
RBC.: 4.52 MIL/uL (ref 3.87–5.11)
Retic Count, Absolute: 96.7 10*3/uL (ref 19.0–186.0)
Retic Ct Pct: 2.1 % (ref 0.4–3.1)

## 2018-08-24 NOTE — Progress Notes (Signed)
Hematology and Oncology Follow Up Visit  MARAL LAMPE [id number] [birth year] 63 y.o. 08/24/2018   Principle Diagnosis:   Iron deficiency anemia  Current Therapy:    IV iron --IV Feraheme given on 04/22/2018 and 04/29/2018     Interim History:  Ms. Schlotter is back for follow-up.  She is doing a lot better.  The IV iron clearly helped her.  When we first saw her, her ferritin was less than 4 with an iron saturation of only 4%.  Only last saw her March, her ferritin was up to 170 and her iron saturation was 26%.  The big news is that they have a new puppy.  It is an [place].  It is probably about 9 weeks old now.  She feels well.  She has an energy.  She has more stamina.  There is no bleeding.  She has had no problems over the coronavirus restrictions.  Overall, her performance status is ECOG 0.   Medications:  Current Outpatient Medications:  .  acetaminophen (TYLENOL) 650 MG CR tablet, Take 1,300 mg by mouth every 8 (eight) hours as needed for pain. , Disp: , Rfl:  .  cetirizine (ZYRTEC) 10 MG tablet, Take 10 mg by mouth daily.  , Disp: , Rfl:  .  Cholecalciferol (VITAMIN D3) 1000 UNITS CAPS, Take 2,000 Units by mouth daily. , Disp: , Rfl:  .  dicyclomine (BENTYL) 10 MG capsule, Take 1 tablet by mouth 30 min before meals and at bedtime as needed for pain, cramping., Disp: 120 capsule, Rfl: 1 .  FLOVENT HFA 220 MCG/ACT inhaler, Inhale 1 puff into the lungs 2 (two) times daily., Disp: , Rfl: 1 .  fluticasone (FLONASE) 50 MCG/ACT nasal spray, Place 1 spray into both nostrils 2 (two) times daily. , Disp: , Rfl:  .  losartan (COZAAR) 25 MG tablet, Take 1 tablet (25 mg total) by mouth daily., Disp: 90 tablet, Rfl: 0 .  meclizine (ANTIVERT) 25 MG tablet, Take 1 tablet (25 mg total) by mouth 3 (three) times daily as needed for dizziness., Disp: 30 tablet, Rfl: 0 .  Multiple Vitamin (MULTIVITAMIN WITH MINERALS) TABS tablet, Take 1 tablet by mouth daily., Disp: , Rfl:  .   omeprazole (PRILOSEC) 40 MG capsule, TAKE 1 CAPSULE BY MOUTH TWICE A DAY, Disp: 180 capsule, Rfl: 1 .  PROAIR HFA 108 (90 BASE) MCG/ACT inhaler, Inhale 2 puffs into the lungs every 6 (six) hours as needed for wheezing or shortness of breath. , Disp: , Rfl:  .  sucralfate (CARAFATE) 1 GM/10ML suspension, Take 10 mLs (1 g total) by mouth every 6 (six) hours as needed., Disp: 420 mL, Rfl: 1 .  venlafaxine XR (EFFEXOR-XR) 37.5 MG 24 hr capsule, Take 37.5 mg by mouth daily with breakfast. , Disp: , Rfl:  .  verapamil (CALAN) 80 MG tablet, Take 160 mg by mouth 2 (two) times daily., Disp: , Rfl:  .  warfarin (COUMADIN) 2 MG tablet, TAKE AS DIRECTED BY COUMADIN CLINIC, Disp: 225 tablet, Rfl: 1  Allergies:  Allergies  Allergen Reactions  . Aspirin Other (See Comments)    Makes asthma worse and makes stomach hurt  . Ivp Dye [Iodinated Diagnostic Agents] Anaphylaxis    Required epinephrine. Since then, has tolerated with premed.  . Latex Other (See Comments) and Cough    Wheezing/cough   . Meperidine Hcl Other (See Comments)    Projectile vomiting   . Penicillins Anaphylaxis    Has patient had a PCN reaction  causing immediate rash, facial/tongue/throat swelling, SOB or lightheadedness with hypotension:Yes Has patient had a PCN reaction causing severe rash involving mucus membranes or skin necrosis:Yes Has patient had a PCN reaction that required hospitalization:Treated in ER w/epi & breathing treatments Has patient had a PCN reaction occurring within the last 10 years:No If all of the above answers are "NO", then may proceed with Cephalosporin use.   . Statins Other (See Comments)    Severe leg pain - has tried most of them  . Sulfonamide Derivatives Anaphylaxis  . Banana Swelling and Cough    Inside of the mouth swells  . Food Other (See Comments) and Cough    Nuts- mucus membranes inflamed/cough   . Peanut-Containing Drug Products Swelling and Cough    Inside of the mouth swells  . Tape  Hives and Dermatitis    No plastic tape!!    Past Medical History, Surgical history, Social history, and Family History were reviewed and updated.  Review of Systems: Review of Systems  Constitutional: Negative.   HENT:  Negative.   Eyes: Negative.   Respiratory: Negative.   Cardiovascular: Negative.   Gastrointestinal: Negative.   Endocrine: Negative.   Genitourinary: Negative.    Musculoskeletal: Negative.   Skin: Negative.   Neurological: Negative.   Hematological: Negative.   Psychiatric/Behavioral: Negative.     Physical Exam:  weight is 234 lb 12.8 oz (106.5 kg). Her oral temperature is 98.7 F (37.1 C). Her blood pressure is 147/62 (abnormal) and her pulse is 74. Her respiration is 18 and oxygen saturation is 98%.   Wt Readings from Last 3 Encounters:  08/24/18 234 lb 12.8 oz (106.5 kg)  05/23/18 235 lb (106.6 kg)  04/14/18 237 lb 0.6 oz (107.5 kg)    Physical Exam Vitals signs reviewed.  HENT:     Head: Normocephalic and atraumatic.  Eyes:     Pupils: Pupils are equal, round, and reactive to light.  Neck:     Musculoskeletal: Normal range of motion.  Cardiovascular:     Rate and Rhythm: Normal rate and regular rhythm.     Heart sounds: Normal heart sounds.  Pulmonary:     Effort: Pulmonary effort is normal.     Breath sounds: Normal breath sounds.  Abdominal:     General: Bowel sounds are normal.     Palpations: Abdomen is soft.  Musculoskeletal: Normal range of motion.        General: No tenderness or deformity.  Lymphadenopathy:     Cervical: No cervical adenopathy.  Skin:    General: Skin is warm and dry.     Findings: No erythema or rash.  Neurological:     Mental Status: She is alert and oriented to person, place, and time.  Psychiatric:        Behavior: Behavior normal.        Thought Content: Thought content normal.        Judgment: Judgment normal.      Lab Results  Component Value Date   WBC 6.8 08/24/2018   HGB 14.0 08/24/2018    HCT 42.2 08/24/2018   MCV 91.3 08/24/2018   PLT 321 08/24/2018     Chemistry      Component Value Date/Time   NA 138 08/24/2018 1427   NA 139 04/23/2016 1609   K 3.7 08/24/2018 1427   CL 100 08/24/2018 1427   CO2 29 08/24/2018 1427   BUN 12 08/24/2018 1427   BUN 17 04/23/2016 1609   CREATININE  0.66 08/24/2018 1427   CREATININE 0.71 05/02/2015 1000      Component Value Date/Time   CALCIUM 9.3 08/24/2018 1427   ALKPHOS 90 08/24/2018 1427   AST 21 08/24/2018 1427   ALT 27 08/24/2018 1427   BILITOT 0.3 08/24/2018 1427       Impression and Plan: Ms. Welp is a 63 year old female.  She has had a very nice response to IV iron.  Her MCV is up nicely.  Her platelet count is down.  We will see what her iron levels are.  I would be very surprised if she needed any iron given the MCV.  I really think we can now get her through the summer time.  I will plan to get her back after Labor Day.  Volanda Napoleon, MD 6/3/20204:12 PM

## 2018-08-25 ENCOUNTER — Telehealth: Payer: Self-pay | Admitting: *Deleted

## 2018-08-25 ENCOUNTER — Telehealth: Payer: Self-pay | Admitting: Hematology & Oncology

## 2018-08-25 LAB — IRON AND TIBC
Iron: 88 ug/dL (ref 41–142)
Saturation Ratios: 24 % (ref 21–57)
TIBC: 369 ug/dL (ref 236–444)
UIBC: 281 ug/dL (ref 120–384)

## 2018-08-25 LAB — FERRITIN: Ferritin: 42 ng/mL (ref 11–307)

## 2018-08-25 NOTE — Telephone Encounter (Addendum)
Message left for patient  ----- Message from Volanda Napoleon, MD sent at 08/25/2018  2:51 PM EDT ----- Please call and let her know that the iron level is okay now.  This is wonderful news.  Thank you

## 2018-08-25 NOTE — Telephone Encounter (Signed)
lvm for 9/2 appts per 6/3 los

## 2018-08-29 ENCOUNTER — Other Ambulatory Visit: Payer: Self-pay | Admitting: Gastroenterology

## 2018-09-01 DIAGNOSIS — S83241A Other tear of medial meniscus, current injury, right knee, initial encounter: Secondary | ICD-10-CM | POA: Diagnosis not present

## 2018-09-07 ENCOUNTER — Telehealth: Payer: Self-pay

## 2018-09-07 NOTE — Telephone Encounter (Signed)

## 2018-09-19 ENCOUNTER — Other Ambulatory Visit: Payer: Self-pay | Admitting: Sports Medicine

## 2018-09-19 DIAGNOSIS — M25561 Pain in right knee: Secondary | ICD-10-CM

## 2018-09-26 ENCOUNTER — Other Ambulatory Visit: Payer: Self-pay | Admitting: Gastroenterology

## 2018-09-29 DIAGNOSIS — N959 Unspecified menopausal and perimenopausal disorder: Secondary | ICD-10-CM | POA: Diagnosis not present

## 2018-09-29 DIAGNOSIS — Z1231 Encounter for screening mammogram for malignant neoplasm of breast: Secondary | ICD-10-CM | POA: Diagnosis not present

## 2018-09-29 DIAGNOSIS — Z01419 Encounter for gynecological examination (general) (routine) without abnormal findings: Secondary | ICD-10-CM | POA: Diagnosis not present

## 2018-09-29 DIAGNOSIS — Z6838 Body mass index (BMI) 38.0-38.9, adult: Secondary | ICD-10-CM | POA: Diagnosis not present

## 2018-09-30 ENCOUNTER — Other Ambulatory Visit: Payer: Self-pay | Admitting: Internal Medicine

## 2018-10-03 ENCOUNTER — Ambulatory Visit (INDEPENDENT_AMBULATORY_CARE_PROVIDER_SITE_OTHER): Payer: 59

## 2018-10-03 ENCOUNTER — Other Ambulatory Visit: Payer: Self-pay | Admitting: Obstetrics and Gynecology

## 2018-10-03 ENCOUNTER — Other Ambulatory Visit: Payer: Self-pay

## 2018-10-03 DIAGNOSIS — I4891 Unspecified atrial fibrillation: Secondary | ICD-10-CM

## 2018-10-03 DIAGNOSIS — Z5181 Encounter for therapeutic drug level monitoring: Secondary | ICD-10-CM | POA: Diagnosis not present

## 2018-10-03 DIAGNOSIS — R928 Other abnormal and inconclusive findings on diagnostic imaging of breast: Secondary | ICD-10-CM

## 2018-10-03 DIAGNOSIS — I480 Paroxysmal atrial fibrillation: Secondary | ICD-10-CM

## 2018-10-03 LAB — POCT INR: INR: 2.8 (ref 2.0–3.0)

## 2018-10-03 NOTE — Patient Instructions (Signed)
Description   Continue on same dosage 4mg  daily except 6mg  on Tuesdays, Thursdays, and Saturdays.  Recheck INR 6 weeks.  Call [facility] [phone] Main # [phone] with any questions

## 2018-10-05 ENCOUNTER — Ambulatory Visit
Admission: RE | Admit: 2018-10-05 | Discharge: 2018-10-05 | Disposition: A | Discharge status: Home / Self Care | Payer: 59 | Source: Ambulatory Visit | Attending: Obstetrics and Gynecology | Admitting: Obstetrics and Gynecology

## 2018-10-05 ENCOUNTER — Other Ambulatory Visit: Payer: Self-pay | Admitting: Obstetrics and Gynecology

## 2018-10-05 ENCOUNTER — Other Ambulatory Visit: Payer: Self-pay

## 2018-10-05 DIAGNOSIS — R928 Other abnormal and inconclusive findings on diagnostic imaging of breast: Secondary | ICD-10-CM

## 2018-10-05 DIAGNOSIS — N6012 Diffuse cystic mastopathy of left breast: Secondary | ICD-10-CM | POA: Diagnosis not present

## 2018-10-05 DIAGNOSIS — N6320 Unspecified lump in the left breast, unspecified quadrant: Secondary | ICD-10-CM

## 2018-10-10 DIAGNOSIS — A084 Viral intestinal infection, unspecified: Secondary | ICD-10-CM | POA: Diagnosis not present

## 2018-10-10 DIAGNOSIS — R109 Unspecified abdominal pain: Secondary | ICD-10-CM | POA: Diagnosis not present

## 2018-10-13 ENCOUNTER — Other Ambulatory Visit: Payer: Self-pay

## 2018-10-13 ENCOUNTER — Ambulatory Visit
Admission: RE | Admit: 2018-10-13 | Discharge: 2018-10-13 | Disposition: A | Discharge status: Home / Self Care | Payer: 59 | Source: Ambulatory Visit | Attending: Sports Medicine | Admitting: Sports Medicine

## 2018-10-13 DIAGNOSIS — M25561 Pain in right knee: Secondary | ICD-10-CM

## 2018-10-13 DIAGNOSIS — M1711 Unilateral primary osteoarthritis, right knee: Secondary | ICD-10-CM | POA: Diagnosis not present

## 2018-10-14 ENCOUNTER — Other Ambulatory Visit: Payer: Self-pay | Admitting: Physician Assistant

## 2018-10-17 DIAGNOSIS — M1711 Unilateral primary osteoarthritis, right knee: Secondary | ICD-10-CM | POA: Diagnosis not present

## 2018-10-20 DIAGNOSIS — M1711 Unilateral primary osteoarthritis, right knee: Secondary | ICD-10-CM | POA: Diagnosis not present

## 2018-10-27 ENCOUNTER — Ambulatory Visit (INDEPENDENT_AMBULATORY_CARE_PROVIDER_SITE_OTHER): Payer: 59 | Admitting: Gastroenterology

## 2018-10-27 ENCOUNTER — Encounter: Payer: Self-pay | Admitting: Gastroenterology

## 2018-10-27 VITALS — BP 120/82 | HR 96 | Temp 98.4°F | Ht 64.0 in | Wt 228.2 lb

## 2018-10-27 DIAGNOSIS — R195 Other fecal abnormalities: Secondary | ICD-10-CM | POA: Insufficient documentation

## 2018-10-27 DIAGNOSIS — R1032 Left lower quadrant pain: Secondary | ICD-10-CM | POA: Diagnosis not present

## 2018-10-27 DIAGNOSIS — M1711 Unilateral primary osteoarthritis, right knee: Secondary | ICD-10-CM | POA: Diagnosis not present

## 2018-10-27 MED ORDER — DICYCLOMINE HCL 10 MG PO CAPS: ORAL_CAPSULE | ORAL | 1 refills | Status: DC

## 2018-10-27 MED ORDER — METRONIDAZOLE 500 MG PO TABS: 500.0000 mg | ORAL_TABLET | Freq: Three times a day (TID) | ORAL | 0 refills | Status: DC

## 2018-10-27 MED ORDER — CIPROFLOXACIN HCL 500 MG PO TABS: 500.0000 mg | ORAL_TABLET | Freq: Two times a day (BID) | ORAL | 0 refills | Status: DC

## 2018-10-27 NOTE — Patient Instructions (Signed)
We have sent in your prescriptions to your pharmacy  Call back in 10 days with update, Ask for Patty Phelps,RN   Thank you for choosing [facility]

## 2018-10-27 NOTE — Progress Notes (Signed)
06/27/8293 [name] [id number] [birth year]   HISTORY OF PRESENT ILLNESS: This is a 63 year old female who is a patient of Dr. Doyne Keel.  She presents here today with complaints of loose stools and lower abdominal pain.  She tells me that this all began rather suddenly on July 11.  She was having normal bowel movements without even needing MiraLAX prior to July 11.  Then on that day she started having 4-5 bowel movements a day and lower abdominal pain/cramping.  She denies any nocturnal stools.  She denies any nausea, vomiting, fevers, chills.  She saw [facility] and they put her on Cipro for 3 days.  While she was on the medication and for a couple of days following she had improvement, and essentially resolution of symptoms, but then they returned again.  The abdominal discomfort is not quite as severe.  She has had issues with recurrent diverticulitis in the past and in fact underwent a robotic sigmoid resection by Dr. Johney Maine in May 2019.  She says that her current symptoms resemble her episodes of diverticulitis in the past.  Last colonoscopy was in January 2019, prior to her surgical resection.   Past Medical History:  Diagnosis Date  . Allergic rhinitis   . Arthritis    "back" (05/07/2016)  . Asthma   . Atrial flutter ([place])    a. Remotely per notes.  . Carpal tunnel syndrome, bilateral   . Colon polyps   . Diverticulitis   . DVT (deep venous thrombosis) ([place]) 1980s x2 -    between birth of two sons. Saw hematology - was told she has a hypercoagulable disorder, should be on Coumadin lifelong.  Marland Kitchen Dysrhythmia    atrial fib  . Family history of adverse reaction to anesthesia    "mother gets PONV"  . Gallbladder disease   . GERD (gastroesophageal reflux disease)   . GIB (gastrointestinal bleeding) 04/14/2018  . Halothane adverse reaction    narrow small opening  . Heart murmur   . Hiatal hernia   . History of hiatal hernia    "small one/CTA 05/01/2016" (05/07/2016)   . HTN (hypertension)   . Hypercholesteremia    hx; "brought it down w/diet" (05/07/2016)  . Hyperglycemia    a. A1c 6.1 in 2014.  . Inguinal hernia   . Iron deficiency anemia due to chronic blood loss 04/14/2018  . Iron malabsorption 04/14/2018  . Left sided sciatica   . Normal cardiac stress test 06/2012  . Obesity   . OSA (obstructive sleep apnea)    a. Mild, did not tolerate CPAP (05/07/2016)  . PAF (paroxysmal atrial fibrillation) ([place])    a. s/p afib ablation at [facility] 2010 (Dr. Annabell Howells). b. Recurrent AF s/p DCCV 06/2010. c. On flecainide.   . Paroxysmal atrial fibrillation ([place]) 09/09/2009   Qualifier: Diagnosis of  By: Selena Batten CMA, [place]    . PONV (postoperative nausea and vomiting)   . PPD positive, treated 1977   "treated for 1 yr after exposure to patient"  . Pre-diabetes   . RSV infection ~ 2006  . Wandering (atrial) pacemaker    a. Remotely per notes.  (  pt. states has a wandering p wave ) no pacemaker   Past Surgical History:  Procedure Laterality Date  . ATRIAL ABLATION SURGERY  05/07/2016   "fib and flutter"  . ATRIAL FIBRILLATION ABLATION  2010   at [facility]  . ATRIAL FIBRILLATION ABLATION N/A 05/07/2016   Procedure: Atrial Fibrillation  Ablation;  Surgeon: Thompson Grayer, MD;  Location: [facility];  Service: Cardiovascular;  Laterality: N/A;  . BREAST CYST ASPIRATION Bilateral   . CARDIAC CATHETERIZATION  2008  . CARPAL TUNNEL RELEASE Bilateral   . CESAREAN SECTION  1986; 1988  . COLONOSCOPY  X 2  . COLONOSCOPY W/ BIOPSIES AND POLYPECTOMY  X 2  . ESOPHAGOGASTRODUODENOSCOPY    . ESOPHAGOGASTRODUODENOSCOPY (EGD) WITH PROPOFOL N/A 07/19/2017   Procedure: ESOPHAGOGASTRODUODENOSCOPY (EGD) WITH PROPOFOL;  Surgeon: Yetta Flock, MD;  Location: [facility];  Service: Gastroenterology;  Laterality: N/A;  . INGUINAL HERNIA REPAIR Right   . LAPAROSCOPIC CHOLECYSTECTOMY  1989  . POLYPECTOMY N/A 07/19/2017   Procedure: POLYPECTOMY;  Surgeon:  Yetta Flock, MD;  Location: [facility];  Service: Gastroenterology;  Laterality: N/A;  . PROCTOSCOPY N/A 08/06/2017   Procedure: PROCTOSCOPY;  Surgeon: Michael Boston, MD;  Location: [facility];  Service: General;  Laterality: N/A;  . TMJ ARTHROPLASTY    . TUBAL LIGATION  1988    reports that she has never smoked. She has never used smokeless tobacco. She reports previous alcohol use. She reports that she does not use drugs. family history includes Colon polyps in her father; Heart attack in her mother and paternal grandfather; Lupus in her sister; Stroke in her maternal grandfather, mother, and paternal grandmother; Thyroid disease in her mother and sister. Allergies  Allergen Reactions  . Aspirin Other (See Comments)    Makes asthma worse and makes stomach hurt  . Ivp Dye [Iodinated Diagnostic Agents] Anaphylaxis    Required epinephrine. Since then, has tolerated with premed.  . Latex Other (See Comments) and Cough    Wheezing/cough   . Meperidine Hcl Other (See Comments)    Projectile vomiting   . Penicillins Anaphylaxis    Has patient had a PCN reaction causing immediate rash, facial/tongue/throat swelling, SOB or lightheadedness with hypotension:Yes Has patient had a PCN reaction causing severe rash involving mucus membranes or skin necrosis:Yes Has patient had a PCN reaction that required hospitalization:Treated in ER w/epi & breathing treatments Has patient had a PCN reaction occurring within the last 10 years:No If all of the above answers are "NO", then may proceed with Cephalosporin use.   . Statins Other (See Comments)    Severe leg pain - has tried most of them  . Sulfonamide Derivatives Anaphylaxis  . Banana Swelling and Cough    Inside of the mouth swells  . Food Other (See Comments) and Cough    Nuts- mucus membranes inflamed/cough   . Peanut-Containing Drug Products Swelling and Cough    Inside of the mouth swells  . Tape Hives and Dermatitis    No plastic  tape!!      Outpatient Encounter Medications as of 10/27/2018  Medication Sig  . acetaminophen (TYLENOL) 650 MG CR tablet Take 1,300 mg by mouth every 8 (eight) hours as needed for pain.   . cetirizine (ZYRTEC) 10 MG tablet Take 10 mg by mouth daily.    . Cholecalciferol (VITAMIN D3) 1000 UNITS CAPS Take 2,000 Units by mouth daily.   Marland Kitchen dicyclomine (BENTYL) 10 MG capsule Take 1 tablet by mouth 30 min before meals and at bedtime as needed for pain, cramping.  Marland Kitchen FLOVENT HFA 220 MCG/ACT inhaler Inhale 1 puff into the lungs 2 (two) times daily.  . fluticasone (FLONASE) 50 MCG/ACT nasal spray Place 1 spray into both nostrils 2 (two) times daily.   Marland Kitchen losartan (COZAAR) 25 MG tablet Take 1 tablet (25  mg total) by mouth daily.  . meclizine (ANTIVERT) 25 MG tablet Take 1 tablet (25 mg total) by mouth 3 (three) times daily as needed for dizziness.  . Multiple Vitamin (MULTIVITAMIN WITH MINERALS) TABS tablet Take 1 tablet by mouth daily.  Marland Kitchen omeprazole (PRILOSEC) 40 MG capsule Take 1 capsule (40 mg total) by mouth 2 (two) times daily. Please schedule a follow up visit for any further refills: [phone]  . PROAIR HFA 108 (90 BASE) MCG/ACT inhaler Inhale 2 puffs into the lungs every 6 (six) hours as needed for wheezing or shortness of breath.   . venlafaxine XR (EFFEXOR-XR) 37.5 MG 24 hr capsule Take 37.5 mg by mouth daily with breakfast.   . verapamil (CALAN) 80 MG tablet Take 160 mg by mouth 2 (two) times daily.  Marland Kitchen warfarin (COUMADIN) 2 MG tablet TAKE AS DIRECTED BY COUMADIN CLINIC  . sucralfate (CARAFATE) 1 GM/10ML suspension Take 10 mLs (1 g total) by mouth every 6 (six) hours as needed. (Patient not taking: Reported on 10/27/2018)   No facility-administered encounter medications on file as of 10/27/2018.      REVIEW OF SYSTEMS  : All other systems reviewed and negative except where noted in the History of Present Illness.   PHYSICAL EXAM: BP 120/82 (BP Location: Left Arm, Patient Position: Sitting, Cuff  Size: Large)   Pulse 96   Temp 98.4 F (36.9 C)   Ht 5\' 4"  (1.626 m)   Wt 228 lb 4 oz (103.5 kg)   BMI 39.18 kg/m  General: Well developed white female in no acute distress Head: Normocephalic and atraumatic Eyes:  Sclerae anicteric, conjunctiva pink. Ears: Normal auditory acuity Lungs: Clear throughout to auscultation; no increased WOB. Heart: Regular rate and rhythm; no M/R/G. Abdomen: Soft, non-distended.  BS present.  Mild LLQ and suprapubic TTP. Musculoskeletal: Symmetrical with no gross deformities  Skin: No lesions on visible extremities Extremities: No edema  Neurological: Alert oriented x 4, grossly non-focal Psychological:  Alert and cooperative. Normal mood and affect  ASSESSMENT AND PLAN: *63 year old female with complaints of change in bowel habits with loose stools and lower abdominal pain/discomfort.  She had robotic sigmoid resection 1 year ago for issues of recurrent diverticulitis.  Symptoms do not sound classic for diverticulitis at this time, but patient is convinced that is what is causing her symptoms now as she said that symptoms are the same as what she had always suffered with in the past.  Will treat with Cipro 500 mg twice daily for 7 days and Flagyl 500 mg 3 times daily for 7 days.  She will call the office back in about 10 days with an update or sooner for any worsening symptoms.  If little or no improvement then will need a CT scan and stool studies.  Will refill her Bentyl as well per her request to help with abdominal spasm and cramping.   CC:  Antony Contras, MD

## 2018-10-30 NOTE — Progress Notes (Signed)
Agree with assessment and plan as outlined.  

## 2018-11-03 DIAGNOSIS — M1711 Unilateral primary osteoarthritis, right knee: Secondary | ICD-10-CM | POA: Diagnosis not present

## 2018-11-08 ENCOUNTER — Ambulatory Visit (INDEPENDENT_AMBULATORY_CARE_PROVIDER_SITE_OTHER): Payer: 59 | Admitting: Podiatry

## 2018-11-08 ENCOUNTER — Other Ambulatory Visit: Payer: Self-pay

## 2018-11-08 ENCOUNTER — Encounter: Payer: Self-pay | Admitting: Podiatry

## 2018-11-08 VITALS — Temp 98.2°F

## 2018-11-08 DIAGNOSIS — M79674 Pain in right toe(s): Secondary | ICD-10-CM

## 2018-11-08 DIAGNOSIS — B351 Tinea unguium: Secondary | ICD-10-CM | POA: Diagnosis not present

## 2018-11-08 DIAGNOSIS — D689 Coagulation defect, unspecified: Secondary | ICD-10-CM | POA: Diagnosis not present

## 2018-11-08 DIAGNOSIS — M79675 Pain in left toe(s): Secondary | ICD-10-CM

## 2018-11-08 DIAGNOSIS — Z7901 Long term (current) use of anticoagulants: Secondary | ICD-10-CM

## 2018-11-08 NOTE — Progress Notes (Signed)
This patient presents the office with chief complaint of thick dicolored toenails on both feet.  Patient states that she is unable to self treat and trim her nails..   She presently is taking Coumadin for A. Fib.  She presents the office today for an evaluation and treatment of her long thick nails.  This patient was told by her coumadin clinic that she could proceed with surgery as desired.  General Appearance  Alert, conversant and in no acute stress.  Vascular  Dorsalis pedis and posterior tibial  pulses are palpable  bilaterally.  Capillary return is within normal limits  bilaterally. Temperature is within normal limits  bilaterally.  Neurologic  Senn-Weinstein monofilament wire test within normal limits  bilaterally. Muscle power within normal limits bilaterally.  Nails Thick disfigured discolored nails with subungual debris  Second left  and one,two right foot..   . No evidence of bacterial infection or drainage bilaterally.  Orthopedic  No limitations of motion  feet .  No crepitus or effusions noted.  No bony pathology or digital deformities noted.  Skin  normotropic skin with no porokeratosis noted bilaterally.  No signs of infections or ulcers noted.    Onychomycosis     IE.  Debride mycotic nails  X 3.  Discussed conservative treatment vs. Surgical removal of nails.  Patient to return to the office in 3 months for continued preventative foot care services.  Patient will call and schedule surgery for removal second toenail left foot.   Gardiner Barefoot DPM

## 2018-11-14 ENCOUNTER — Ambulatory Visit (INDEPENDENT_AMBULATORY_CARE_PROVIDER_SITE_OTHER): Payer: 59 | Admitting: *Deleted

## 2018-11-14 ENCOUNTER — Other Ambulatory Visit: Payer: Self-pay

## 2018-11-14 DIAGNOSIS — Z5181 Encounter for therapeutic drug level monitoring: Secondary | ICD-10-CM | POA: Diagnosis not present

## 2018-11-14 DIAGNOSIS — I4891 Unspecified atrial fibrillation: Secondary | ICD-10-CM | POA: Diagnosis not present

## 2018-11-14 DIAGNOSIS — I480 Paroxysmal atrial fibrillation: Secondary | ICD-10-CM | POA: Diagnosis not present

## 2018-11-14 LAB — POCT INR: INR: 3.2 — AB (ref 2.0–3.0)

## 2018-11-14 NOTE — Patient Instructions (Signed)
Description   Tomorrow take 2 tablets then continue on same dosage 4mg  daily except 6mg  on Tuesdays, Thursdays, and Saturdays.  Recheck INR 6 weeks.  Call Coumadin Clinic [phone] Main # [phone] with any questions

## 2018-11-15 DIAGNOSIS — R195 Other fecal abnormalities: Secondary | ICD-10-CM

## 2018-11-15 DIAGNOSIS — R1032 Left lower quadrant pain: Secondary | ICD-10-CM

## 2018-11-17 ENCOUNTER — Other Ambulatory Visit: Payer: Self-pay

## 2018-11-17 DIAGNOSIS — R1032 Left lower quadrant pain: Secondary | ICD-10-CM

## 2018-11-17 DIAGNOSIS — R109 Unspecified abdominal pain: Secondary | ICD-10-CM

## 2018-11-17 DIAGNOSIS — R195 Other fecal abnormalities: Secondary | ICD-10-CM

## 2018-11-17 MED ORDER — PREDNISONE 50 MG PO TABS: 50.0000 mg | ORAL_TABLET | ORAL | 0 refills | Status: DC

## 2018-11-17 NOTE — Addendum Note (Signed)
Addended by: Timothy Lasso on: 11/17/2018 11:29 AM   Modules accepted: Orders

## 2018-11-17 NOTE — Telephone Encounter (Signed)
[name] the pt has an allergy to contrast.  Do you want to change the order or do a pre med?

## 2018-11-17 NOTE — Progress Notes (Signed)
Error

## 2018-11-18 ENCOUNTER — Other Ambulatory Visit: Payer: Self-pay | Admitting: Gastroenterology

## 2018-11-18 ENCOUNTER — Other Ambulatory Visit: Payer: 59

## 2018-11-18 DIAGNOSIS — M1712 Unilateral primary osteoarthritis, left knee: Secondary | ICD-10-CM | POA: Diagnosis not present

## 2018-11-21 ENCOUNTER — Other Ambulatory Visit: Payer: Self-pay

## 2018-11-21 ENCOUNTER — Other Ambulatory Visit (INDEPENDENT_AMBULATORY_CARE_PROVIDER_SITE_OTHER): Payer: 59

## 2018-11-21 ENCOUNTER — Telehealth: Payer: Self-pay | Admitting: Gastroenterology

## 2018-11-21 DIAGNOSIS — R195 Other fecal abnormalities: Secondary | ICD-10-CM | POA: Diagnosis not present

## 2018-11-21 DIAGNOSIS — R1032 Left lower quadrant pain: Secondary | ICD-10-CM

## 2018-11-21 DIAGNOSIS — R109 Unspecified abdominal pain: Secondary | ICD-10-CM

## 2018-11-21 LAB — CREATININE, SERUM: Creatinine, Ser: 0.64 mg/dL (ref 0.40–1.20)

## 2018-11-21 LAB — BUN: BUN: 13 mg/dL (ref 6–23)

## 2018-11-21 NOTE — Telephone Encounter (Signed)
Stat lab order in Epic for BUN and CREAT

## 2018-11-21 NOTE — Telephone Encounter (Signed)
Nurse called from [facility] CT to request that order states "I-STAT Creatinine."

## 2018-11-21 NOTE — Telephone Encounter (Signed)
The pt will come in for labs to day for CT scan tomorrow

## 2018-11-21 NOTE — Telephone Encounter (Signed)
Ct at [facility] called to advise that pt needs updated creatine labs since her last one was in June. Pt is scheduled for a Ct tomorrow, they say that if you put in the order " I - Stat" they will be able to draw it there before her Ct scan.

## 2018-11-21 NOTE — Progress Notes (Signed)
error 

## 2018-11-22 ENCOUNTER — Other Ambulatory Visit: Payer: Self-pay

## 2018-11-22 ENCOUNTER — Ambulatory Visit (HOSPITAL_COMMUNITY)
Admission: RE | Admit: 2018-11-22 | Discharge: 2018-11-22 | Disposition: A | Discharge status: Home / Self Care | Payer: 59 | Source: Ambulatory Visit | Attending: Gastroenterology | Admitting: Gastroenterology

## 2018-11-22 DIAGNOSIS — R195 Other fecal abnormalities: Secondary | ICD-10-CM | POA: Insufficient documentation

## 2018-11-22 DIAGNOSIS — R109 Unspecified abdominal pain: Secondary | ICD-10-CM | POA: Diagnosis not present

## 2018-11-22 DIAGNOSIS — R1032 Left lower quadrant pain: Secondary | ICD-10-CM | POA: Diagnosis not present

## 2018-11-22 MED ORDER — SODIUM CHLORIDE (PF) 0.9 % IJ SOLN
INTRAMUSCULAR | Status: AC
  Filled 2018-11-22: qty 50

## 2018-11-22 MED ORDER — IOHEXOL 300 MG/ML  SOLN
100.0000 mL | Freq: Once | INTRAMUSCULAR | Status: AC | PRN
  Administered 2018-11-22: 16:00:00 100 mL via INTRAVENOUS

## 2018-11-23 ENCOUNTER — Encounter: Payer: Self-pay | Admitting: Family

## 2018-11-23 ENCOUNTER — Inpatient Hospital Stay: Payer: 59

## 2018-11-23 ENCOUNTER — Telehealth: Payer: Self-pay | Admitting: Family

## 2018-11-23 ENCOUNTER — Inpatient Hospital Stay: Payer: 59 | Attending: Hematology & Oncology | Admitting: Family

## 2018-11-23 VITALS — BP 151/65 | HR 55 | Temp 96.6°F | Resp 18 | Ht 64.0 in | Wt 230.1 lb

## 2018-11-23 DIAGNOSIS — D500 Iron deficiency anemia secondary to blood loss (chronic): Secondary | ICD-10-CM

## 2018-11-23 DIAGNOSIS — Z79899 Other long term (current) drug therapy: Secondary | ICD-10-CM | POA: Diagnosis not present

## 2018-11-23 DIAGNOSIS — Z7952 Long term (current) use of systemic steroids: Secondary | ICD-10-CM | POA: Insufficient documentation

## 2018-11-23 DIAGNOSIS — R197 Diarrhea, unspecified: Secondary | ICD-10-CM | POA: Diagnosis not present

## 2018-11-23 DIAGNOSIS — K5900 Constipation, unspecified: Secondary | ICD-10-CM | POA: Diagnosis not present

## 2018-11-23 DIAGNOSIS — D509 Iron deficiency anemia, unspecified: Secondary | ICD-10-CM | POA: Diagnosis not present

## 2018-11-23 DIAGNOSIS — Z7901 Long term (current) use of anticoagulants: Secondary | ICD-10-CM | POA: Insufficient documentation

## 2018-11-23 DIAGNOSIS — K909 Intestinal malabsorption, unspecified: Secondary | ICD-10-CM | POA: Diagnosis not present

## 2018-11-23 DIAGNOSIS — R5383 Other fatigue: Secondary | ICD-10-CM | POA: Insufficient documentation

## 2018-11-23 LAB — CMP (CANCER CENTER ONLY)
ALT: 18 U/L (ref 0–44)
AST: 13 U/L — ABNORMAL LOW (ref 15–41)
Albumin: 4.3 g/dL (ref 3.5–5.0)
Alkaline Phosphatase: 86 U/L (ref 38–126)
Anion gap: 10 (ref 5–15)
BUN: 19 mg/dL (ref 8–23)
CO2: 29 mmol/L (ref 22–32)
Calcium: 9.9 mg/dL (ref 8.9–10.3)
Chloride: 100 mmol/L (ref 98–111)
Creatinine: 0.81 mg/dL (ref 0.44–1.00)
GFR, Est AFR Am: >60 mL/min (ref >60)
GFR, Estimated: >60 mL/min (ref >60)
Glucose, Bld: 140 mg/dL — ABNORMAL HIGH (ref 70–99)
Potassium: 3.4 mmol/L — ABNORMAL LOW (ref 3.5–5.1)
Sodium: 139 mmol/L (ref 135–145)
Total Bilirubin: 0.3 mg/dL (ref 0.3–1.2)
Total Protein: 7.3 g/dL (ref 6.5–8.1)

## 2018-11-23 LAB — CBC WITH DIFFERENTIAL (CANCER CENTER ONLY)
Abs Immature Granulocytes: 0.06 10*3/uL (ref 0.00–0.07)
Basophils Absolute: 0.1 10*3/uL (ref 0.0–0.1)
Basophils Relative: 0 %
Eosinophils Absolute: 0 10*3/uL (ref 0.0–0.5)
Eosinophils Relative: 0 %
HCT: 42.9 % (ref 36.0–46.0)
Hemoglobin: 14.3 g/dL (ref 12.0–15.0)
Immature Granulocytes: 1 %
Lymphocytes Relative: 29 %
Lymphs Abs: 3.4 10*3/uL (ref 0.7–4.0)
MCH: 31 pg (ref 26.0–34.0)
MCHC: 33.3 g/dL (ref 30.0–36.0)
MCV: 92.9 fL (ref 80.0–100.0)
Monocytes Absolute: 1.3 10*3/uL — ABNORMAL HIGH (ref 0.1–1.0)
Monocytes Relative: 11 %
Neutro Abs: 7 10*3/uL (ref 1.7–7.7)
Neutrophils Relative %: 59 %
Platelet Count: 409 10*3/uL — ABNORMAL HIGH (ref 150–400)
RBC: 4.62 MIL/uL (ref 3.87–5.11)
RDW: 12.7 % (ref 11.5–15.5)
WBC Count: 11.8 10*3/uL — ABNORMAL HIGH (ref 4.0–10.5)
nRBC: 0 % (ref 0.0–0.2)

## 2018-11-23 NOTE — Progress Notes (Signed)
Hematology and Oncology Follow Up Visit  [name] [id number] y.o. 11/23/2018   Principle Diagnosis:  Iron deficiency anemia  Current Therapy:   IV iron as indicated  Interim History:  [name] is here today for follow-up. She is symptomatic with fatigue and feels that her iron may be a low. Hgb is stable at 14.3, MCV 92 and platelets are 409.  No episodes of bleeding. No bruising or petechiae.  She states that she has a cyst in her right breast that her PCP is watching. She is scheduled for follow-up imaging in January 2021.  No fever, chills, n/v, cough, rash, dizziness, SOB, chest pain, palpitations, abdominal pain or changes in bowel or bladder habits.  She has started taking Bentyl for bowel spasms and this seems to be helping. She does still have intermittent constipation and diarrhea. She did have a CT scan yesterday for further eval and it was negative.  No swelling, numbness or tingling in her extremities.  She has arthritis in both knees and gets gel and cortisone injections as needed. She had then last a couple weeks ago. Her appetite comes and goes. She is staying well hydrated. Her weight is stable.   ECOG Performance Status: 1 - Symptomatic but completely ambulatory  Medications:  Allergies as of 11/23/2018      Reactions   Aspirin Other (See Comments)   Makes asthma worse and makes stomach hurt   Banana Swelling, Cough   Inside of the mouth swells   Food Other (See Comments), Cough   Nuts- mucus membranes inflamed/cough   Ivp Dye [iodinated Diagnostic Agents] Anaphylaxis   Required epinephrine. Since then, has tolerated with premed.   Latex Other (See Comments), Cough   Wheezing/cough   Meperidine Hcl Other (See Comments)   Projectile vomiting   Peanut-containing Drug Products Swelling, Cough   Inside of the mouth swells   Penicillins Anaphylaxis   Has patient had a PCN reaction causing immediate rash, facial/tongue/throat swelling, SOB or  lightheadedness with hypotension:Yes Has patient had a PCN reaction causing severe rash involving mucus membranes or skin necrosis:Yes Has patient had a PCN reaction that required hospitalization:Treated in ER w/epi & breathing treatments Has patient had a PCN reaction occurring within the last 10 years:No If all of the above answers are "NO", then may proceed with Cephalosporin use.   Statins Other (See Comments)   Severe leg pain - has tried most of them   Sulfonamide Derivatives Anaphylaxis   Tape Hives, Dermatitis   No plastic tape!!      Medication List       Accurate as of November 23, 2018  2:57 PM. If you have any questions, ask your nurse or doctor.        STOP taking these medications   ciprofloxacin 500 MG tablet Commonly known as: CIPRO Stopped by: Laverna Peace, NP     TAKE these medications   acetaminophen 650 MG CR tablet Commonly known as: TYLENOL Take 1,300 mg by mouth every 8 (eight) hours as needed for pain.   cetirizine 10 MG tablet Commonly known as: ZYRTEC Take 10 mg by mouth daily.   dicyclomine 10 MG capsule Commonly known as: BENTYL TAKE 1 CAPSULE BY MOUTH 30 MINS BEFORE MEALS AND AT BEDTIME AS NEEDED FOR PAIN/CRAMPING   Flovent HFA 220 MCG/ACT inhaler Generic drug: fluticasone Inhale 1 puff into the lungs 2 (two) times daily.   fluticasone 50 MCG/ACT nasal spray Commonly known as: FLONASE Place 1 spray  into both nostrils 2 (two) times daily.   losartan 25 MG tablet Commonly known as: COZAAR Take 1 tablet (25 mg total) by mouth daily.   meclizine 25 MG tablet Commonly known as: ANTIVERT Take 1 tablet (25 mg total) by mouth 3 (three) times daily as needed for dizziness.   metroNIDAZOLE 500 MG tablet Commonly known as: FLAGYL Take 1 tablet (500 mg total) by mouth 3 (three) times daily.   multivitamin with minerals Tabs tablet Take 1 tablet by mouth daily.   omeprazole 40 MG capsule Commonly known as: PRILOSEC Take 1 capsule (40  mg total) by mouth 2 (two) times daily. Please schedule a follow up visit for any further refills: [phone]   predniSONE 50 MG tablet Commonly known as: DELTASONE Take 1 tablet (50 mg total) by mouth as directed. Take 1 tablet 13 hours before procedure, 7 hours prior to procedure and 1 hour prior to procedure.   ProAir HFA 108 (90 Base) MCG/ACT inhaler Generic drug: albuterol Inhale 2 puffs into the lungs every 6 (six) hours as needed for wheezing or shortness of breath.   sucralfate 1 GM/10ML suspension Commonly known as: CARAFATE Take 10 mLs (1 g total) by mouth every 6 (six) hours as needed.   Vascepa 1 g Caps Generic drug: Icosapent Ethyl TAKE 2 CAPSULES TWICE A DAY WITH FOOD   venlafaxine XR 37.5 MG 24 hr capsule Commonly known as: EFFEXOR-XR Take 37.5 mg by mouth daily with breakfast.   verapamil 80 MG tablet Commonly known as: CALAN Take 160 mg by mouth 2 (two) times daily.   Vitamin D3 25 MCG (1000 UT) Caps Take 2,000 Units by mouth daily.   warfarin 2 MG tablet Commonly known as: COUMADIN Take as directed by the anticoagulation clinic. If you are unsure how to take this medication, talk to your nurse or doctor. Original instructions: TAKE AS DIRECTED BY COUMADIN CLINIC       Allergies:  Allergies  Allergen Reactions  . Aspirin Other (See Comments)    Makes asthma worse and makes stomach hurt  . Banana Swelling and Cough    Inside of the mouth swells  . Food Other (See Comments) and Cough    Nuts- mucus membranes inflamed/cough   . Ivp Dye [Iodinated Diagnostic Agents] Anaphylaxis    Required epinephrine. Since then, has tolerated with premed.  . Latex Other (See Comments) and Cough    Wheezing/cough   . Meperidine Hcl Other (See Comments)    Projectile vomiting   . Peanut-Containing Drug Products Swelling and Cough    Inside of the mouth swells  . Penicillins Anaphylaxis    Has patient had a PCN reaction causing immediate rash, facial/tongue/throat  swelling, SOB or lightheadedness with hypotension:Yes Has patient had a PCN reaction causing severe rash involving mucus membranes or skin necrosis:Yes Has patient had a PCN reaction that required hospitalization:Treated in ER w/epi & breathing treatments Has patient had a PCN reaction occurring within the last 10 years:No If all of the above answers are "NO", then may proceed with Cephalosporin use.   . Statins Other (See Comments)    Severe leg pain - has tried most of them  . Sulfonamide Derivatives Anaphylaxis  . Tape Hives and Dermatitis    No plastic tape!!    Past Medical History, Surgical history, Social history, and Family History were reviewed and updated.  Review of Systems: All other 10 point review of systems is negative.   Physical Exam:  vitals were not taken  for this visit.   Wt Readings from Last 3 Encounters:  10/27/18 228 lb 4 oz (103.5 kg)  08/24/18 234 lb 12.8 oz (106.5 kg)  05/23/18 235 lb (106.6 kg)    Ocular: Sclerae unicteric, pupils equal, round and reactive to light Ear-nose-throat: Oropharynx clear, dentition fair Lymphatic: No cervical or supraclavicular adenopathy Lungs no rales or rhonchi, good excursion bilaterally Heart regular rate and rhythm, no murmur appreciated Abd soft, nontender, positive bowel sounds, no liver or spleen tip palpated on exam, no fluid wave  MSK no focal spinal tenderness, no joint edema Neuro: non-focal, well-oriented, appropriate affect Breasts: Deferred   Lab Results  Component Value Date   WBC 11.8 (H) 11/23/2018   HGB 14.3 11/23/2018   HCT 42.9 11/23/2018   MCV 92.9 11/23/2018   PLT 409 (H) 11/23/2018   Lab Results  Component Value Date   FERRITIN 42 08/24/2018   IRON 88 08/24/2018   TIBC 369 08/24/2018   UIBC 281 08/24/2018   IRONPCTSAT 24 08/24/2018   Lab Results  Component Value Date   RETICCTPCT 2.1 08/24/2018   RBC 4.62 11/23/2018   No results found for: KPAFRELGTCHN, LAMBDASER, KAPLAMBRATIO  No results found for: IGGSERUM, IGA, IGMSERUM No results found for: Odetta Pink, SPEI   Chemistry      Component Value Date/Time   NA 138 08/24/2018 1427   NA 139 04/23/2016 1609   K 3.7 08/24/2018 1427   CL 100 08/24/2018 1427   CO2 29 08/24/2018 1427   BUN 13 11/21/2018 1508   BUN 17 04/23/2016 1609   CREATININE 0.64 11/21/2018 1508   CREATININE 0.66 08/24/2018 1427   CREATININE 0.71 05/02/2015 1000      Component Value Date/Time   CALCIUM 9.3 08/24/2018 1427   ALKPHOS 90 08/24/2018 1427   AST 21 08/24/2018 1427   ALT 27 08/24/2018 1427   BILITOT 0.3 08/24/2018 1427       Impression and Plan: Ms. Yoke is a very pleasant 63 yo caucasian female with iron deficiency anemia.  She is symptomatic with fatigue as mentioned above.  We will see what her iron studies show and bring her back in for infusion if needed.  We will plan to see her back in another 4 months.  She will contact our office with any questions or concerns. We can certainly see her sooner if needed.   Laverna Peace, NP 9/2/20202:57 PM

## 2018-11-23 NOTE — Telephone Encounter (Signed)
Appointments scheduled LMVM per 9/2 los

## 2018-11-24 LAB — FERRITIN: Ferritin: 27 ng/mL (ref 11–307)

## 2018-11-24 LAB — IRON AND TIBC
Iron: 84 ug/dL (ref 41–142)
Saturation Ratios: 23 % (ref 21–57)
TIBC: 374 ug/dL (ref 236–444)
UIBC: 290 ug/dL (ref 120–384)

## 2018-11-25 LAB — STOOL CULTURE
MICRO NUMBER:: 829379
MICRO NUMBER:: 829380
MICRO NUMBER:: 829381
SHIGA RESULT:: NOT DETECTED
SPECIMEN QUALITY:: ADEQUATE
SPECIMEN QUALITY:: ADEQUATE
SPECIMEN QUALITY:: ADEQUATE

## 2018-11-25 LAB — OVA AND PARASITE EXAMINATION
CONCENTRATE RESULT:: NONE SEEN
MICRO NUMBER:: 829437
SPECIMEN QUALITY:: ADEQUATE
TRICHROME RESULT:: NONE SEEN

## 2018-11-25 LAB — CLOSTRIDIUM DIFFICILE TOXIN B, QUALITATIVE, REAL-TIME PCR: Toxigenic C. Difficile by PCR: NOT DETECTED

## 2018-11-30 ENCOUNTER — Ambulatory Visit: Payer: 59 | Admitting: Podiatry

## 2018-12-09 DIAGNOSIS — M1712 Unilateral primary osteoarthritis, left knee: Secondary | ICD-10-CM | POA: Diagnosis not present

## 2018-12-16 DIAGNOSIS — M1712 Unilateral primary osteoarthritis, left knee: Secondary | ICD-10-CM | POA: Diagnosis not present

## 2018-12-23 ENCOUNTER — Other Ambulatory Visit: Payer: Self-pay | Admitting: Gastroenterology

## 2018-12-26 ENCOUNTER — Other Ambulatory Visit: Payer: Self-pay

## 2018-12-26 ENCOUNTER — Ambulatory Visit (INDEPENDENT_AMBULATORY_CARE_PROVIDER_SITE_OTHER): Payer: 59 | Admitting: Pharmacist

## 2018-12-26 DIAGNOSIS — I480 Paroxysmal atrial fibrillation: Secondary | ICD-10-CM

## 2018-12-26 DIAGNOSIS — M1712 Unilateral primary osteoarthritis, left knee: Secondary | ICD-10-CM | POA: Diagnosis not present

## 2018-12-26 DIAGNOSIS — Z5181 Encounter for therapeutic drug level monitoring: Secondary | ICD-10-CM | POA: Diagnosis not present

## 2018-12-26 DIAGNOSIS — I4891 Unspecified atrial fibrillation: Secondary | ICD-10-CM

## 2018-12-26 LAB — POCT INR: INR: 2.6 (ref 2.0–3.0)

## 2018-12-26 NOTE — Patient Instructions (Signed)
Description   Continue on same dosage 4mg daily except 6mg on Tuesdays, Thursdays, and Saturdays.  Recheck INR 6 weeks.  Call Coumadin Clinic [phone] Main # [phone] with any questions      

## 2019-02-06 ENCOUNTER — Other Ambulatory Visit: Payer: Self-pay

## 2019-02-06 ENCOUNTER — Ambulatory Visit (INDEPENDENT_AMBULATORY_CARE_PROVIDER_SITE_OTHER): Payer: 59 | Admitting: *Deleted

## 2019-02-06 DIAGNOSIS — I4891 Unspecified atrial fibrillation: Secondary | ICD-10-CM | POA: Diagnosis not present

## 2019-02-06 DIAGNOSIS — I480 Paroxysmal atrial fibrillation: Secondary | ICD-10-CM | POA: Diagnosis not present

## 2019-02-06 DIAGNOSIS — Z5181 Encounter for therapeutic drug level monitoring: Secondary | ICD-10-CM | POA: Diagnosis not present

## 2019-02-06 LAB — POCT INR: INR: 3 (ref 2.0–3.0)

## 2019-02-06 NOTE — Patient Instructions (Signed)
Description   Continue on same dosage 4mg daily except 6mg on Tuesdays, Thursdays, and Saturdays.  Recheck INR 6 weeks.  Call Coumadin Clinic [phone] Main # [phone] with any questions      

## 2019-02-08 ENCOUNTER — Encounter: Payer: Self-pay | Admitting: Podiatry

## 2019-02-08 ENCOUNTER — Ambulatory Visit (INDEPENDENT_AMBULATORY_CARE_PROVIDER_SITE_OTHER): Payer: 59 | Admitting: Podiatry

## 2019-02-08 ENCOUNTER — Other Ambulatory Visit: Payer: Self-pay

## 2019-02-08 DIAGNOSIS — B351 Tinea unguium: Secondary | ICD-10-CM | POA: Diagnosis not present

## 2019-02-08 DIAGNOSIS — M79675 Pain in left toe(s): Secondary | ICD-10-CM

## 2019-02-08 DIAGNOSIS — D689 Coagulation defect, unspecified: Secondary | ICD-10-CM | POA: Diagnosis not present

## 2019-02-08 DIAGNOSIS — M79674 Pain in right toe(s): Secondary | ICD-10-CM

## 2019-02-08 NOTE — Progress Notes (Signed)
This patient presents the office with chief complaint of thick dicolored toenails on both feet.  Patient states that she is unable to self treat and trim her nails..   She presently is taking Coumadin for A. Fib.  She presents the office today for an evaluation and treatment of her long thick nails  General Appearance  Alert, conversant and in no acute stress.  Vascular  Dorsalis pedis and posterior tibial  pulses are palpable  bilaterally.  Capillary return is within normal limits  bilaterally. Temperature is within normal limits  bilaterally.  Neurologic  Senn-Weinstein monofilament wire test within normal limits  bilaterally. Muscle power within normal limits bilaterally.  Nails Thick disfigured discolored nails with subungual debris  Second left  and one,two right foot..   . No evidence of bacterial infection or drainage bilaterally.  Orthopedic  No limitations of motion  feet .  No crepitus or effusions noted.  No bony pathology or digital deformities noted.  Skin  normotropic skin with no porokeratosis noted bilaterally.  No signs of infections or ulcers noted.    Onychomycosis     IE.  Debride mycotic nails  X 3.  RTC 10 weeks.   Gardiner Barefoot DPM

## 2019-02-10 DIAGNOSIS — E782 Mixed hyperlipidemia: Secondary | ICD-10-CM | POA: Diagnosis not present

## 2019-02-10 DIAGNOSIS — J309 Allergic rhinitis, unspecified: Secondary | ICD-10-CM | POA: Diagnosis not present

## 2019-02-10 DIAGNOSIS — E1169 Type 2 diabetes mellitus with other specified complication: Secondary | ICD-10-CM | POA: Diagnosis not present

## 2019-02-10 DIAGNOSIS — K219 Gastro-esophageal reflux disease without esophagitis: Secondary | ICD-10-CM | POA: Diagnosis not present

## 2019-02-10 DIAGNOSIS — J45909 Unspecified asthma, uncomplicated: Secondary | ICD-10-CM | POA: Diagnosis not present

## 2019-02-10 DIAGNOSIS — I10 Essential (primary) hypertension: Secondary | ICD-10-CM | POA: Diagnosis not present

## 2019-02-10 DIAGNOSIS — R232 Flushing: Secondary | ICD-10-CM | POA: Diagnosis not present

## 2019-02-10 DIAGNOSIS — M8588 Other specified disorders of bone density and structure, other site: Secondary | ICD-10-CM | POA: Diagnosis not present

## 2019-02-10 DIAGNOSIS — Z8679 Personal history of other diseases of the circulatory system: Secondary | ICD-10-CM | POA: Diagnosis not present

## 2019-02-27 ENCOUNTER — Telehealth (INDEPENDENT_AMBULATORY_CARE_PROVIDER_SITE_OTHER): Payer: 59 | Admitting: Internal Medicine

## 2019-02-27 VITALS — Ht 65.0 in | Wt 230.0 lb

## 2019-02-27 DIAGNOSIS — I10 Essential (primary) hypertension: Secondary | ICD-10-CM

## 2019-02-27 DIAGNOSIS — I480 Paroxysmal atrial fibrillation: Secondary | ICD-10-CM

## 2019-02-27 NOTE — Progress Notes (Signed)
Electrophysiology TeleHealth Note   Due to national recommendations of social distancing due to [place] 19, an audio telehealth visit is felt to be most appropriate for this patient at this time.  Verbal consent was obtained by me for the telehealth visit today.  The patient does not have capability for a virtual visit.  A phone visit is therefore required today.   Date:  02/27/2019   ID:  [name], DOB [id number], MRN [id number]  Location: patient's home  Provider location:  [facility]  Evaluation Performed: Follow-up visit  PCP:  Antony Contras, MD   Electrophysiologist:  Dr Rayann Heman  Chief Complaint:  palpitations  History of Present Illness:    [name] is a 63 y.o. female who presents via telehealth conferencing today.  Since last being seen in our clinic, the patient reports doing very well.  Today, she denies symptoms of palpitations, chest pain, shortness of breath,  lower extremity edema, dizziness, presyncope, or syncope.  The patient is otherwise without complaint today.  The patient denies symptoms of fevers, chills, cough, or new SOB worrisome for COVID 19.  Past Medical History:  Diagnosis Date  . Allergic rhinitis   . Arthritis    "back" (05/07/2016)  . Asthma   . Atrial flutter ([place])    a. Remotely per notes.  . Carpal tunnel syndrome, bilateral   . Colon polyps   . Diverticulitis   . DVT (deep venous thrombosis) ([place]) 1980s x2 -    between birth of two sons. Saw hematology - was told she has a hypercoagulable disorder, should be on Coumadin lifelong.  Marland Kitchen Dysrhythmia    atrial fib  . Family history of adverse reaction to anesthesia    "mother gets PONV"  . Gallbladder disease   . GERD (gastroesophageal reflux disease)   . GIB (gastrointestinal bleeding) 04/14/2018  . Halothane adverse reaction    narrow small opening  . Heart murmur   . Hiatal hernia   . History of hiatal hernia    "small one/CTA 05/01/2016" (05/07/2016)  . HTN  (hypertension)   . Hypercholesteremia    hx; "brought it down w/diet" (05/07/2016)  . Hyperglycemia    a. A1c 6.1 in 2014.  . Inguinal hernia   . Iron deficiency anemia due to chronic blood loss 04/14/2018  . Iron malabsorption 04/14/2018  . Left sided sciatica   . Normal cardiac stress test 06/2012  . Obesity   . OSA (obstructive sleep apnea)    a. Mild, did not tolerate CPAP (05/07/2016)  . PAF (paroxysmal atrial fibrillation) ([place])    a. s/p afib ablation at [facility] 2010 (Dr. Annabell Howells). b. Recurrent AF s/p DCCV 06/2010. c. On flecainide.   . Paroxysmal atrial fibrillation ([place]) 09/09/2009   Qualifier: Diagnosis of  By: Selena Batten CMA, [place]    . PONV (postoperative nausea and vomiting)   . PPD positive, treated 1977   "treated for 1 yr after exposure to patient"  . Pre-diabetes   . RSV infection ~ 2006  . Wandering (atrial) pacemaker    a. Remotely per notes.  (  pt. states has a wandering p wave ) no pacemaker    Past Surgical History:  Procedure Laterality Date  . ATRIAL ABLATION SURGERY  05/07/2016   "fib and flutter"  . ATRIAL FIBRILLATION ABLATION  2010   at [facility]  . ATRIAL FIBRILLATION ABLATION N/A 05/07/2016   Procedure: Atrial Fibrillation Ablation;  Surgeon: Thompson Grayer, MD;  Location: [facility]  INVASIVE CV LAB;  Service: Cardiovascular;  Laterality: N/A;  . BREAST CYST ASPIRATION Bilateral   . CARDIAC CATHETERIZATION  2008  . CARPAL TUNNEL RELEASE Bilateral   . CESAREAN SECTION  1986; 1988  . COLONOSCOPY  X 2  . COLONOSCOPY W/ BIOPSIES AND POLYPECTOMY  X 2  . ESOPHAGOGASTRODUODENOSCOPY    . ESOPHAGOGASTRODUODENOSCOPY (EGD) WITH PROPOFOL N/A 07/19/2017   Procedure: ESOPHAGOGASTRODUODENOSCOPY (EGD) WITH PROPOFOL;  Surgeon: Yetta Flock, MD;  Location: [facility] ENDOSCOPY;  Service: Gastroenterology;  Laterality: N/A;  . INGUINAL HERNIA REPAIR Right   . LAPAROSCOPIC CHOLECYSTECTOMY  1989  . POLYPECTOMY N/A 07/19/2017   Procedure: POLYPECTOMY;  Surgeon: Yetta Flock, MD;  Location: [facility];  Service: Gastroenterology;  Laterality: N/A;  . PROCTOSCOPY N/A 08/06/2017   Procedure: PROCTOSCOPY;  Surgeon: Michael Boston, MD;  Location: [facility] ORS;  Service: General;  Laterality: N/A;  . TMJ ARTHROPLASTY    . TUBAL LIGATION  1988    Current Outpatient Medications  Medication Sig Dispense Refill  . acetaminophen (TYLENOL) 650 MG CR tablet Take 1,300 mg by mouth every 8 (eight) hours as needed for pain.     . cetirizine (ZYRTEC) 10 MG tablet Take 10 mg by mouth daily.      . Cholecalciferol (VITAMIN D3) 1000 UNITS CAPS Take 2,000 Units by mouth daily.     Marland Kitchen dicyclomine (BENTYL) 10 MG capsule TAKE 1 CAPSULE BY MOUTH 30 MINS BEFORE MEALS AND AT BEDTIME AS NEEDED FOR PAIN/CRAMPING 360 capsule 1  . FLOVENT HFA 220 MCG/ACT inhaler Inhale 1 puff into the lungs 2 (two) times daily.  1  . fluticasone (FLONASE) 50 MCG/ACT nasal spray Place 1 spray into both nostrils 2 (two) times daily.     Marland Kitchen losartan (COZAAR) 25 MG tablet Take 1 tablet (25 mg total) by mouth daily. 90 tablet 0  . meclizine (ANTIVERT) 25 MG tablet Take 1 tablet (25 mg total) by mouth 3 (three) times daily as needed for dizziness. 30 tablet 0  . Multiple Vitamin (MULTIVITAMIN WITH MINERALS) TABS tablet Take 1 tablet by mouth daily.    Marland Kitchen omeprazole (PRILOSEC) 40 MG capsule Take 1 capsule (40 mg total) by mouth 2 (two) times daily. Please schedule a follow up visit for any further refills: [phone] 60 capsule 1  . PROAIR HFA 108 (90 BASE) MCG/ACT inhaler Inhale 2 puffs into the lungs every 6 (six) hours as needed for wheezing or shortness of breath.     . sucralfate (CARAFATE) 1 GM/10ML suspension Take 10 mLs (1 g total) by mouth every 6 (six) hours as needed. 420 mL 1  . venlafaxine XR (EFFEXOR-XR) 37.5 MG 24 hr capsule Take 37.5 mg by mouth daily with breakfast.     . verapamil (CALAN) 80 MG tablet Take 160 mg by mouth 2 (two) times daily.    Marland Kitchen warfarin (COUMADIN) 2 MG tablet TAKE AS DIRECTED BY  COUMADIN CLINIC 225 tablet 1   No current facility-administered medications for this visit.     Allergies:   Aspirin, Banana, Food, Ivp dye [iodinated diagnostic agents], Latex, Meperidine hcl, Peanut-containing drug products, Penicillins, Statins, Sulfonamide derivatives, and Tape   Social History:  The patient  reports that she has never smoked. She has never used smokeless tobacco. She reports previous alcohol use. She reports that she does not use drugs.   Family History:  The patient's family history includes Colon polyps in her father; Heart attack in her mother and paternal grandfather; Lupus in her sister; Stroke  in her maternal grandfather, mother, and paternal grandmother; Thyroid disease in her mother and sister.   ROS:  Please see the history of present illness.   All other systems are personally reviewed and negative.    Exam:    Vital Signs:  Ht 5\' 5"  (1.651 m)   Wt 230 lb (104.3 kg)   BMI 38.27 kg/m   Well sounding, alert and conversant   Labs/Other Tests and Data Reviewed:    Recent Labs: 11/23/2018: ALT 18; BUN 19; Creatinine 0.81; Hemoglobin 14.3; Platelet Count 409; Potassium 3.4; Sodium 139   Wt Readings from Last 3 Encounters:  02/27/19 230 lb (104.3 kg)  11/23/18 230 lb 1.9 oz (104.4 kg)  10/27/18 228 lb 4 oz (103.5 kg)    ASSESSMENT & PLAN:    1.  afib Doing very well s/p avblation off AAD therapy Continues to take coumadin without issues chads2vasc score is 2.  She has had prior DVT and should take long term anticoagulation.  She is not interested in [place]  2. HTN Stable 120/78 at Dr Laqueta Linden office 2 weeks ago with HR 70s-80s No change required today  3. Overweight Lifestyle modification encouraged  4. OSA Sleep apnea is mild She does not think that she would benefit from OSA She has seen Dr Maxwell Caul previously  Follow-up:  Return in a year   Patient Risk:  after full review of this patients clinical status, I feel that they are at  moderate risk at this time.  Today, I have spent 15 minutes with the patient with telehealth technology discussing arrhythmia management .    SignedThompson Grayer, MD  02/27/2019 3:38 PM     [address] [place] [address] [phone] (office) [phone] (fax)

## 2019-03-20 ENCOUNTER — Ambulatory Visit (INDEPENDENT_AMBULATORY_CARE_PROVIDER_SITE_OTHER): Payer: 59 | Admitting: *Deleted

## 2019-03-20 ENCOUNTER — Other Ambulatory Visit: Payer: Self-pay

## 2019-03-20 DIAGNOSIS — I480 Paroxysmal atrial fibrillation: Secondary | ICD-10-CM | POA: Diagnosis not present

## 2019-03-20 DIAGNOSIS — I4891 Unspecified atrial fibrillation: Secondary | ICD-10-CM | POA: Diagnosis not present

## 2019-03-20 DIAGNOSIS — Z5181 Encounter for therapeutic drug level monitoring: Secondary | ICD-10-CM | POA: Diagnosis not present

## 2019-03-20 LAB — POCT INR: INR: 3 (ref 2.0–3.0)

## 2019-03-20 NOTE — Patient Instructions (Signed)
Description   Continue on same dosage 4mg  daily except 6mg  on Tuesdays, Thursdays, and Saturdays. Recheck INR 6 weeks.  Call Coumadin Clinic [phone] Main # [phone] with any questions

## 2019-03-27 ENCOUNTER — Other Ambulatory Visit: Payer: Self-pay

## 2019-03-27 ENCOUNTER — Inpatient Hospital Stay (HOSPITAL_BASED_OUTPATIENT_CLINIC_OR_DEPARTMENT_OTHER): Payer: 59 | Admitting: Family

## 2019-03-27 ENCOUNTER — Telehealth: Payer: Self-pay | Admitting: Family

## 2019-03-27 ENCOUNTER — Encounter: Payer: Self-pay | Admitting: Family

## 2019-03-27 ENCOUNTER — Inpatient Hospital Stay: Payer: 59 | Attending: Family

## 2019-03-27 VITALS — BP 132/81 | HR 95 | Temp 97.1°F | Resp 18 | Ht 64.0 in | Wt 228.1 lb

## 2019-03-27 DIAGNOSIS — D509 Iron deficiency anemia, unspecified: Secondary | ICD-10-CM | POA: Insufficient documentation

## 2019-03-27 DIAGNOSIS — K909 Intestinal malabsorption, unspecified: Secondary | ICD-10-CM | POA: Diagnosis not present

## 2019-03-27 DIAGNOSIS — R197 Diarrhea, unspecified: Secondary | ICD-10-CM | POA: Insufficient documentation

## 2019-03-27 DIAGNOSIS — Z7951 Long term (current) use of inhaled steroids: Secondary | ICD-10-CM | POA: Diagnosis not present

## 2019-03-27 DIAGNOSIS — D500 Iron deficiency anemia secondary to blood loss (chronic): Secondary | ICD-10-CM

## 2019-03-27 DIAGNOSIS — Z79899 Other long term (current) drug therapy: Secondary | ICD-10-CM | POA: Diagnosis not present

## 2019-03-27 DIAGNOSIS — K5900 Constipation, unspecified: Secondary | ICD-10-CM | POA: Diagnosis not present

## 2019-03-27 LAB — CBC WITH DIFFERENTIAL (CANCER CENTER ONLY)
Abs Immature Granulocytes: 0.02 10*3/uL (ref 0.00–0.07)
Basophils Absolute: 0.1 10*3/uL (ref 0.0–0.1)
Basophils Relative: 1 %
Eosinophils Absolute: 0.1 10*3/uL (ref 0.0–0.5)
Eosinophils Relative: 2 %
HCT: 43.5 % (ref 36.0–46.0)
Hemoglobin: 14.5 g/dL (ref 12.0–15.0)
Immature Granulocytes: 0 %
Lymphocytes Relative: 37 %
Lymphs Abs: 2.7 10*3/uL (ref 0.7–4.0)
MCH: 30.1 pg (ref 26.0–34.0)
MCHC: 33.3 g/dL (ref 30.0–36.0)
MCV: 90.4 fL (ref 80.0–100.0)
Monocytes Absolute: 0.6 10*3/uL (ref 0.1–1.0)
Monocytes Relative: 8 %
Neutro Abs: 3.8 10*3/uL (ref 1.7–7.7)
Neutrophils Relative %: 52 %
Platelet Count: 372 10*3/uL (ref 150–400)
RBC: 4.81 MIL/uL (ref 3.87–5.11)
RDW: 12.4 % (ref 11.5–15.5)
WBC Count: 7.4 10*3/uL (ref 4.0–10.5)
nRBC: 0 % (ref 0.0–0.2)

## 2019-03-27 LAB — CMP (CANCER CENTER ONLY)
ALT: 24 U/L (ref 0–44)
AST: 23 U/L (ref 15–41)
Albumin: 4.5 g/dL (ref 3.5–5.0)
Alkaline Phosphatase: 78 U/L (ref 38–126)
Anion gap: 9 (ref 5–15)
BUN: 12 mg/dL (ref 8–23)
CO2: 28 mmol/L (ref 22–32)
Calcium: 9.9 mg/dL (ref 8.9–10.3)
Chloride: 101 mmol/L (ref 98–111)
Creatinine: 0.75 mg/dL (ref 0.44–1.00)
GFR, Est AFR Am: >60 mL/min (ref >60)
GFR, Estimated: >60 mL/min (ref >60)
Glucose, Bld: 165 mg/dL — ABNORMAL HIGH (ref 70–99)
Potassium: 3.6 mmol/L (ref 3.5–5.1)
Sodium: 138 mmol/L (ref 135–145)
Total Bilirubin: 0.3 mg/dL (ref 0.3–1.2)
Total Protein: 7.4 g/dL (ref 6.5–8.1)

## 2019-03-27 NOTE — Telephone Encounter (Signed)
Appointments scheduled calendar printed per 1/4 los 

## 2019-03-27 NOTE — Progress Notes (Signed)
Hematology and Oncology Follow Up Visit  [name] [id number] 64 y.o. 03/27/2019   Principle Diagnosis:  Iron deficiency anemia  Current Therapy:   IV iron as indicated   Interim History: [name] is here today for follow-up. She is doing well and has no complaints at this time.  Hgb is stable at 14.5. Iron studies are pending.  No episodes of bleeding. No bruising or petechiae.  She denies fever, chills, n/v, cough, rash, dizziness, SOB, chest pain, palpitations, abdominal pain or changes in her bowel or bladder habits.  She fluctuates between diarrhea and constipation and will take Miralax when needed.  No swelling, tenderness, numbness or tingling in her extremities.  No falls or syncopal episodes to report.  She has maintained a good appetite and is staying well hydrated. Her weight is stable.   ECOG Performance Status: 0 - Asymptomatic  Medications:  Allergies as of 03/27/2019      Reactions   Aspirin Other (See Comments)   Makes asthma worse and makes stomach hurt   Banana Swelling, Cough   Inside of the mouth swells   Food Other (See Comments), Cough   Nuts- mucus membranes inflamed/cough   Ivp Dye [iodinated Diagnostic Agents] Anaphylaxis   Required epinephrine. Since then, has tolerated with premed.   Latex Other (See Comments), Cough   Wheezing/cough   Meperidine Hcl Other (See Comments)   Projectile vomiting   Peanut-containing Drug Products Swelling, Cough   Inside of the mouth swells   Penicillins Anaphylaxis   Has patient had a PCN reaction causing immediate rash, facial/tongue/throat swelling, SOB or lightheadedness with hypotension:Yes Has patient had a PCN reaction causing severe rash involving mucus membranes or skin necrosis:Yes Has patient had a PCN reaction that required hospitalization:Treated in ER w/epi & breathing treatments Has patient had a PCN reaction occurring within the last 10 years:No If all of the above answers are "NO",  then may proceed with Cephalosporin use.   Statins Other (See Comments)   Severe leg pain - has tried most of them   Sulfonamide Derivatives Anaphylaxis   Tape Hives, Dermatitis   No plastic tape!!      Medication List       Accurate as of March 27, 2019  1:38 PM. If you have any questions, ask your nurse or doctor.        acetaminophen 650 MG CR tablet Commonly known as: TYLENOL Take 1,300 mg by mouth every 8 (eight) hours as needed for pain.   cetirizine 10 MG tablet Commonly known as: ZYRTEC Take 10 mg by mouth daily.   dicyclomine 10 MG capsule Commonly known as: BENTYL TAKE 1 CAPSULE BY MOUTH 30 MINS BEFORE MEALS AND AT BEDTIME AS NEEDED FOR PAIN/CRAMPING   Flovent HFA 220 MCG/ACT inhaler Generic drug: fluticasone Inhale 1 puff into the lungs 2 (two) times daily.   fluticasone 50 MCG/ACT nasal spray Commonly known as: FLONASE Place 1 spray into both nostrils 2 (two) times daily.   losartan 25 MG tablet Commonly known as: COZAAR Take 1 tablet (25 mg total) by mouth daily.   meclizine 25 MG tablet Commonly known as: ANTIVERT Take 1 tablet (25 mg total) by mouth 3 (three) times daily as needed for dizziness.   multivitamin with minerals Tabs tablet Take 1 tablet by mouth daily.   omeprazole 40 MG capsule Commonly known as: PRILOSEC Take 1 capsule (40 mg total) by mouth 2 (two) times daily. Please schedule a follow up visit for any  further refills: [phone]   ProAir HFA 108 (90 Base) MCG/ACT inhaler Generic drug: albuterol Inhale 2 puffs into the lungs every 6 (six) hours as needed for wheezing or shortness of breath.   sucralfate 1 GM/10ML suspension Commonly known as: CARAFATE Take 10 mLs (1 g total) by mouth every 6 (six) hours as needed.   venlafaxine XR 37.5 MG 24 hr capsule Commonly known as: EFFEXOR-XR Take 37.5 mg by mouth daily with breakfast.   verapamil 80 MG tablet Commonly known as: CALAN Take 160 mg by mouth 2 (two) times daily.     Vitamin D3 25 MCG (1000 UT) Caps Take 2,000 Units by mouth daily.   warfarin 2 MG tablet Commonly known as: COUMADIN Take as directed by the anticoagulation clinic. If you are unsure how to take this medication, talk to your nurse or doctor. Original instructions: TAKE AS DIRECTED BY COUMADIN CLINIC       Allergies:  Allergies  Allergen Reactions  . Aspirin Other (See Comments)    Makes asthma worse and makes stomach hurt  . Banana Swelling and Cough    Inside of the mouth swells  . Food Other (See Comments) and Cough    Nuts- mucus membranes inflamed/cough   . Ivp Dye [Iodinated Diagnostic Agents] Anaphylaxis    Required epinephrine. Since then, has tolerated with premed.  . Latex Other (See Comments) and Cough    Wheezing/cough   . Meperidine Hcl Other (See Comments)    Projectile vomiting   . Peanut-Containing Drug Products Swelling and Cough    Inside of the mouth swells  . Penicillins Anaphylaxis    Has patient had a PCN reaction causing immediate rash, facial/tongue/throat swelling, SOB or lightheadedness with hypotension:Yes Has patient had a PCN reaction causing severe rash involving mucus membranes or skin necrosis:Yes Has patient had a PCN reaction that required hospitalization:Treated in ER w/epi & breathing treatments Has patient had a PCN reaction occurring within the last 10 years:No If all of the above answers are "NO", then may proceed with Cephalosporin use.   . Statins Other (See Comments)    Severe leg pain - has tried most of them  . Sulfonamide Derivatives Anaphylaxis  . Tape Hives and Dermatitis    No plastic tape!!    Past Medical History, Surgical history, Social history, and Family History were reviewed and updated.  Review of Systems: All other 10 point review of systems is negative.   Physical Exam:  height is 5\' 4"  (1.626 m) and weight is 228 lb 1.9 oz (103.5 kg). Her temporal temperature is 97.1 F (36.2 C) (abnormal). Her blood  pressure is 132/81 and her pulse is 95. Her respiration is 18 and oxygen saturation is 98%.   Wt Readings from Last 3 Encounters:  03/27/19 228 lb 1.9 oz (103.5 kg)  02/27/19 230 lb (104.3 kg)  11/23/18 230 lb 1.9 oz (104.4 kg)    Ocular: Sclerae unicteric, pupils equal, round and reactive to light Ear-nose-throat: Oropharynx clear, dentition fair Lymphatic: No cervical or supraclavicular adenopathy Lungs no rales or rhonchi, good excursion bilaterally Heart regular rate and rhythm, no murmur appreciated Abd soft, nontender, positive bowel sounds, no liver or spleen tip palpated on exam, no fluid wave  MSK no focal spinal tenderness, no joint edema Neuro: non-focal, well-oriented, appropriate affect Breasts: Deferred   Lab Results  Component Value Date   WBC 7.4 03/27/2019   HGB 14.5 03/27/2019   HCT 43.5 03/27/2019   MCV 90.4 03/27/2019  PLT 372 03/27/2019   Lab Results  Component Value Date   FERRITIN 27 11/23/2018   IRON 84 11/23/2018   TIBC 374 11/23/2018   UIBC 290 11/23/2018   IRONPCTSAT 23 11/23/2018   Lab Results  Component Value Date   RETICCTPCT 2.1 08/24/2018   RBC 4.81 03/27/2019   No results found for: KPAFRELGTCHN, LAMBDASER, KAPLAMBRATIO No results found for: IGGSERUM, IGA, IGMSERUM No results found for: Odetta Pink, SPEI   Chemistry      Component Value Date/Time   NA 139 11/23/2018 1429   NA 139 04/23/2016 1609   K 3.4 (L) 11/23/2018 1429   CL 100 11/23/2018 1429   CO2 29 11/23/2018 1429   BUN 19 11/23/2018 1429   BUN 17 04/23/2016 1609   CREATININE 0.81 11/23/2018 1429   CREATININE 0.71 05/02/2015 1000      Component Value Date/Time   CALCIUM 9.9 11/23/2018 1429   ALKPHOS 86 11/23/2018 1429   AST 13 (L) 11/23/2018 1429   ALT 18 11/23/2018 1429   BILITOT 0.3 11/23/2018 1429       Impression and Plan: Ms. Pleger is a very pleasant 64 yo caucasian female with iron deficiency anemia.   We will see what her iron studies show and bring her back in for infusion if needed.  We will plan to see her back in another 6 months.  She will contact our office with any questions or concerns. We can certainly see her sooner if needed.   Laverna Peace, NP 1/4/20211:38 PM

## 2019-03-28 LAB — IRON AND TIBC
Iron: 74 ug/dL (ref 41–142)
Saturation Ratios: 20 % — ABNORMAL LOW (ref 21–57)
TIBC: 368 ug/dL (ref 236–444)
UIBC: 294 ug/dL (ref 120–384)

## 2019-03-28 LAB — FERRITIN: Ferritin: 42 ng/mL (ref 11–307)

## 2019-03-29 ENCOUNTER — Telehealth: Payer: Self-pay | Admitting: Hematology & Oncology

## 2019-03-29 NOTE — Telephone Encounter (Signed)
Called and spoke with patient regarding appointment added per 1/5 sch msg

## 2019-04-03 ENCOUNTER — Other Ambulatory Visit: Payer: Self-pay | Admitting: Family

## 2019-04-06 ENCOUNTER — Inpatient Hospital Stay: Payer: 59

## 2019-04-06 ENCOUNTER — Other Ambulatory Visit: Payer: Self-pay

## 2019-04-06 ENCOUNTER — Other Ambulatory Visit: Payer: Self-pay | Admitting: Family

## 2019-04-06 VITALS — BP 123/64 | HR 71 | Temp 97.1°F | Resp 18

## 2019-04-06 DIAGNOSIS — R197 Diarrhea, unspecified: Secondary | ICD-10-CM | POA: Diagnosis not present

## 2019-04-06 DIAGNOSIS — K5900 Constipation, unspecified: Secondary | ICD-10-CM | POA: Diagnosis not present

## 2019-04-06 DIAGNOSIS — D500 Iron deficiency anemia secondary to blood loss (chronic): Secondary | ICD-10-CM

## 2019-04-06 DIAGNOSIS — K909 Intestinal malabsorption, unspecified: Secondary | ICD-10-CM

## 2019-04-06 DIAGNOSIS — Z7951 Long term (current) use of inhaled steroids: Secondary | ICD-10-CM | POA: Diagnosis not present

## 2019-04-06 DIAGNOSIS — Z79899 Other long term (current) drug therapy: Secondary | ICD-10-CM | POA: Diagnosis not present

## 2019-04-06 DIAGNOSIS — D509 Iron deficiency anemia, unspecified: Secondary | ICD-10-CM | POA: Diagnosis not present

## 2019-04-06 MED ORDER — METHYLPREDNISOLONE SODIUM SUCC 125 MG IJ SOLR
INTRAMUSCULAR | Status: AC
  Filled 2019-04-06: qty 2

## 2019-04-06 MED ORDER — FAMOTIDINE IN NACL 20-0.9 MG/50ML-% IV SOLN
INTRAVENOUS | Status: AC
  Filled 2019-04-06: qty 50

## 2019-04-06 MED ORDER — SODIUM CHLORIDE 0.9 % IV SOLN
Freq: Once | INTRAVENOUS | Status: DC
  Filled 2019-04-06: qty 250

## 2019-04-06 MED ORDER — SODIUM CHLORIDE 0.9 % IV SOLN
200.0000 mg | Freq: Once | INTRAVENOUS | Status: AC
  Administered 2019-04-06: 200 mg via INTRAVENOUS
  Filled 2019-04-06: qty 10

## 2019-04-06 MED ORDER — FAMOTIDINE IN NACL 20-0.9 MG/50ML-% IV SOLN
20.0000 mg | Freq: Once | INTRAVENOUS | Status: AC
  Administered 2019-04-06: 20 mg via INTRAVENOUS

## 2019-04-06 MED ORDER — METHYLPREDNISOLONE SODIUM SUCC 125 MG IJ SOLR
60.0000 mg | Freq: Once | INTRAMUSCULAR | Status: AC
  Administered 2019-04-06: 13:00:00 60 mg via INTRAVENOUS

## 2019-04-06 NOTE — Patient Instructions (Signed)

## 2019-04-08 ENCOUNTER — Other Ambulatory Visit: Payer: Self-pay | Admitting: Internal Medicine

## 2019-04-12 ENCOUNTER — Ambulatory Visit
Admission: RE | Admit: 2019-04-12 | Discharge: 2019-04-12 | Disposition: A | Discharge status: Home / Self Care | Payer: 59 | Source: Ambulatory Visit | Attending: Obstetrics and Gynecology | Admitting: Obstetrics and Gynecology

## 2019-04-12 ENCOUNTER — Other Ambulatory Visit: Payer: Self-pay

## 2019-04-12 DIAGNOSIS — N6002 Solitary cyst of left breast: Secondary | ICD-10-CM | POA: Diagnosis not present

## 2019-04-12 DIAGNOSIS — N6320 Unspecified lump in the left breast, unspecified quadrant: Secondary | ICD-10-CM

## 2019-04-12 DIAGNOSIS — R928 Other abnormal and inconclusive findings on diagnostic imaging of breast: Secondary | ICD-10-CM | POA: Diagnosis not present

## 2019-04-19 ENCOUNTER — Other Ambulatory Visit: Payer: Self-pay

## 2019-04-19 ENCOUNTER — Encounter: Payer: Self-pay | Admitting: Podiatry

## 2019-04-19 ENCOUNTER — Ambulatory Visit (INDEPENDENT_AMBULATORY_CARE_PROVIDER_SITE_OTHER): Payer: 59 | Admitting: Podiatry

## 2019-04-19 DIAGNOSIS — B351 Tinea unguium: Secondary | ICD-10-CM

## 2019-04-19 DIAGNOSIS — D689 Coagulation defect, unspecified: Secondary | ICD-10-CM | POA: Diagnosis not present

## 2019-04-19 DIAGNOSIS — M79674 Pain in right toe(s): Secondary | ICD-10-CM | POA: Diagnosis not present

## 2019-04-19 DIAGNOSIS — M79675 Pain in left toe(s): Secondary | ICD-10-CM

## 2019-04-19 NOTE — Progress Notes (Signed)
Complaint:  Visit Type: Patient returns to my office for continued preventative foot care services. Complaint: Patient states" my nails have grown long and thick and become painful to walk and wear shoes" Patient has been taking coumadin.The patient presents for preventative foot care services.  Podiatric Exam: Vascular: dorsalis pedis and posterior tibial pulses are palpable bilateral. Capillary return is immediate. Temperature gradient is WNL. Skin turgor WNL  Sensorium: Normal Semmes Weinstein monofilament test. Normal tactile sensation bilaterally. Nail Exam: Pt has thick disfigured discolored nails second left and first and third right. Ulcer Exam: There is no evidence of ulcer or pre-ulcerative changes or infection. Orthopedic Exam: Muscle tone and strength are WNL. No limitations in general ROM. No crepitus or effusions noted. Foot type and digits show no abnormalities. Bony prominences are unremarkable. Skin: No Porokeratosis. No infection or ulcers  Diagnosis:  Onychomycosis, , Pain in right toe, pain in left toes  Treatment & Plan Procedures and Treatment: Consent by patient was obtained for treatment procedures.   Debridement of mycotic and hypertrophic toenails, 1 through 5 bilateral and clearing of subungual debris. No ulceration, no infection noted.  Return Visit-Office Procedure: Patient instructed to return to the office for a follow up visit 10 weeks  for continued evaluation and treatment.    Gardiner Barefoot DPM

## 2019-05-01 ENCOUNTER — Ambulatory Visit (INDEPENDENT_AMBULATORY_CARE_PROVIDER_SITE_OTHER): Payer: 59 | Admitting: *Deleted

## 2019-05-01 ENCOUNTER — Other Ambulatory Visit: Payer: Self-pay

## 2019-05-01 DIAGNOSIS — I480 Paroxysmal atrial fibrillation: Secondary | ICD-10-CM

## 2019-05-01 DIAGNOSIS — I4891 Unspecified atrial fibrillation: Secondary | ICD-10-CM | POA: Diagnosis not present

## 2019-05-01 DIAGNOSIS — Z5181 Encounter for therapeutic drug level monitoring: Secondary | ICD-10-CM | POA: Diagnosis not present

## 2019-05-01 LAB — POCT INR: INR: 2.9 (ref 2.0–3.0)

## 2019-05-01 NOTE — Patient Instructions (Signed)
Description   Continue on same dosage 4mg daily except 6mg on Tuesdays, Thursdays, and Saturdays. Recheck INR 8 weeks.  Call Coumadin Clinic [phone] Main # [phone] with any questions     

## 2019-05-03 ENCOUNTER — Other Ambulatory Visit: Payer: Self-pay | Admitting: Gastroenterology

## 2019-05-12 DIAGNOSIS — J019 Acute sinusitis, unspecified: Secondary | ICD-10-CM | POA: Diagnosis not present

## 2019-05-16 MED FILL — VENLAFAXINE HCL ER 37.5 MG: 37.5 | 90 days supply | Qty: 90 | Fill #0

## 2019-05-16 MED FILL — FLOVENT HFA 220 MCG INHALER: 220 | 30 days supply | Qty: 12 | Fill #0

## 2019-06-28 ENCOUNTER — Other Ambulatory Visit: Payer: Self-pay

## 2019-06-28 ENCOUNTER — Encounter: Payer: Self-pay | Admitting: Podiatry

## 2019-06-28 ENCOUNTER — Ambulatory Visit (INDEPENDENT_AMBULATORY_CARE_PROVIDER_SITE_OTHER): Payer: Medicare Other | Admitting: Podiatry

## 2019-06-28 VITALS — Temp 96.9°F

## 2019-06-28 DIAGNOSIS — D689 Coagulation defect, unspecified: Secondary | ICD-10-CM | POA: Diagnosis not present

## 2019-06-28 DIAGNOSIS — M79675 Pain in left toe(s): Secondary | ICD-10-CM

## 2019-06-28 DIAGNOSIS — B351 Tinea unguium: Secondary | ICD-10-CM | POA: Diagnosis not present

## 2019-06-28 DIAGNOSIS — M79674 Pain in right toe(s): Secondary | ICD-10-CM

## 2019-06-28 NOTE — Progress Notes (Signed)
This patient returns to my office for at risk foot care.  This patient requires this care by a professional since this patient will be at risk due to having coagulation defect.  Patient is taking coumadin.  This patient is unable to cut nails herself since the patient cannot reach her nails.These nails are painful walking and wearing shoes.  This patient presents for at risk foot care today.  General Appearance  Alert, conversant and in no acute stress.  Vascular  Dorsalis pedis and posterior tibial  pulses are palpable  bilaterally.  Capillary return is within normal limits  bilaterally. Temperature is within normal limits  bilaterally.  Neurologic  Senn-Weinstein monofilament wire test within normal limits  bilaterally. Muscle power within normal limits bilaterally.  Nails Thick disfigured discolored nails with subungual debris  Second left and first and third right foot.  No evidence of bacterial infection or drainage bilaterally.  Orthopedic  No limitations of motion  feet .  No crepitus or effusions noted.  No bony pathology or digital deformities noted.  Skin  normotropic skin with no porokeratosis noted bilaterally.  No signs of infections or ulcers noted.     Onychomycosis  Pain in right toes  Pain in left toes  Consent was obtained for treatment procedures.   Mechanical debridement of nails 1-5  bilaterally performed with a nail nipper.  Filed with dremel without incident.    Return office visit   9 weeks                  Told patient to return for periodic foot care and evaluation due to potential at risk complications.   Rilan Eiland DPM  

## 2019-07-07 ENCOUNTER — Ambulatory Visit (INDEPENDENT_AMBULATORY_CARE_PROVIDER_SITE_OTHER): Payer: Medicare Other | Admitting: *Deleted

## 2019-07-07 ENCOUNTER — Other Ambulatory Visit: Payer: Self-pay

## 2019-07-07 DIAGNOSIS — Z5181 Encounter for therapeutic drug level monitoring: Secondary | ICD-10-CM | POA: Diagnosis not present

## 2019-07-07 DIAGNOSIS — I480 Paroxysmal atrial fibrillation: Secondary | ICD-10-CM | POA: Diagnosis not present

## 2019-07-07 DIAGNOSIS — I4891 Unspecified atrial fibrillation: Secondary | ICD-10-CM

## 2019-07-07 LAB — POCT INR: INR: 2.5 (ref 2.0–3.0)

## 2019-07-07 NOTE — Patient Instructions (Signed)
Description   Continue on same dosage 4mg daily except 6mg on Tuesdays, Thursdays, and Saturdays. Recheck INR 8 weeks.  Call Coumadin Clinic [phone] Main # [phone] with any questions     

## 2019-07-28 IMAGING — MR MRI OF THE RIGHT KNEE WITHOUT CONTRAST
4 of 6 series · 23 of 40 positions shown · non-contrast
Comparison: None.

CLINICAL DATA: Chronic knee pain for several years. Pain has
worsened in the last 4 weeks.

EXAM:
MRI OF THE RIGHT KNEE WITHOUT CONTRAST
TECHNIQUE: Multiplanar, multisequence MR imaging of the knee was performed. No
intravenous contrast was administered.

[Series 4: T1 · coronal · 4.0mm · 0.29mm/px · 5 of 29 slices shown]
[im 1/29]
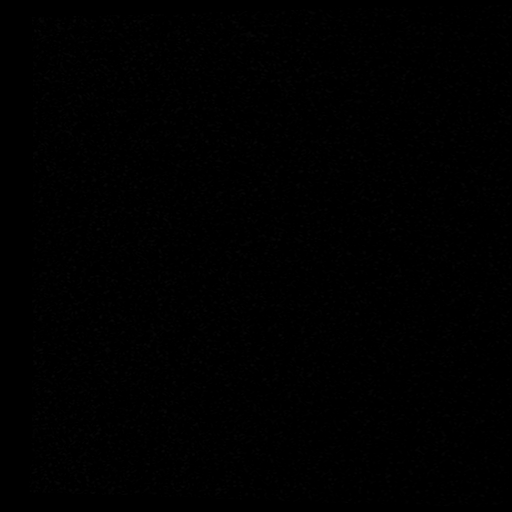
[im 5/29]
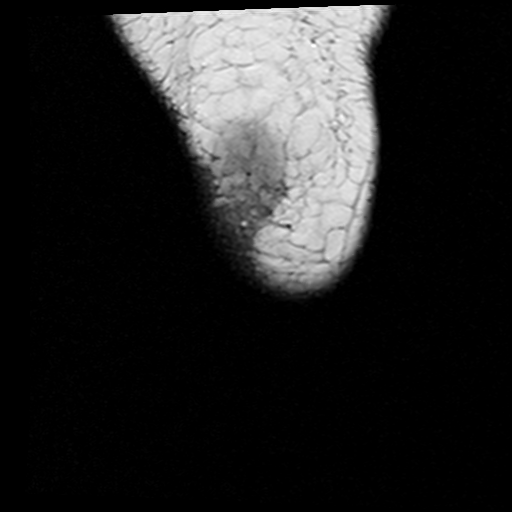
[im 10/29]
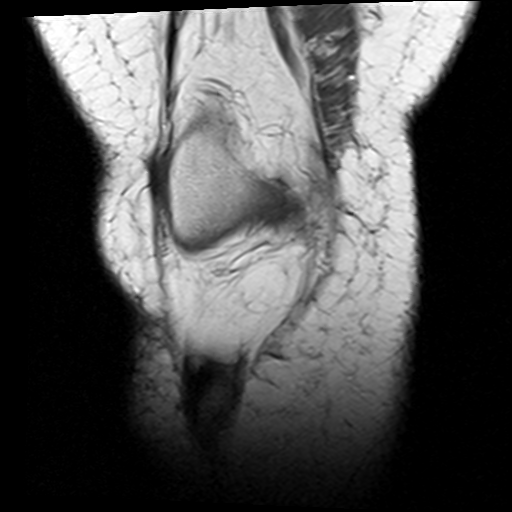
[im 15/29]
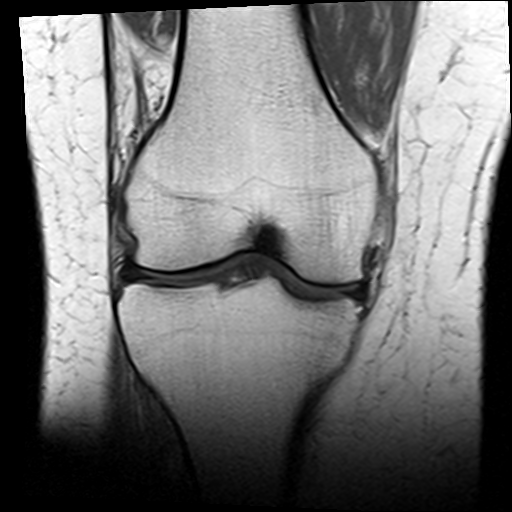
[im 24/29]
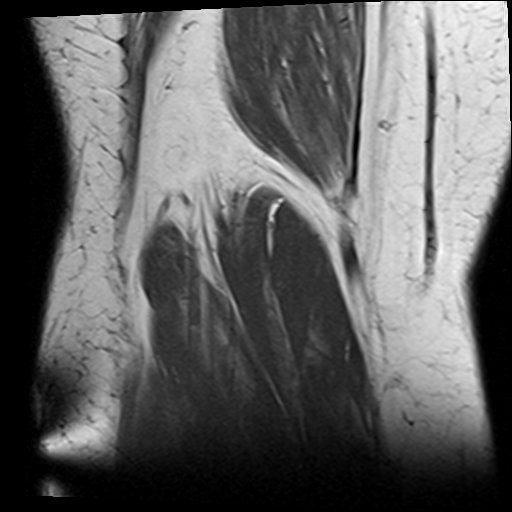

[Series 5: T2 fat-sat · coronal · 4.0mm · 0.59mm/px · 6 of 25 slices shown (1 of 2)]
[im 1/25]
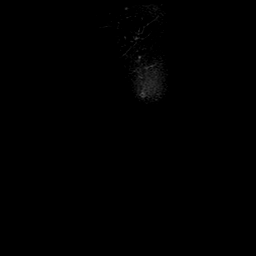
[im 5/25]
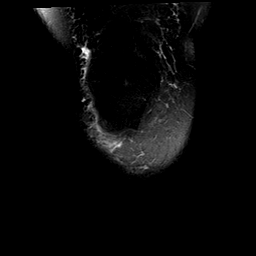
[im 10/25]
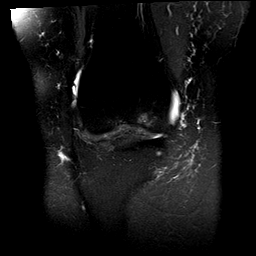
[im 15/25]
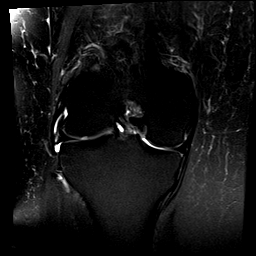
[im 20/25]
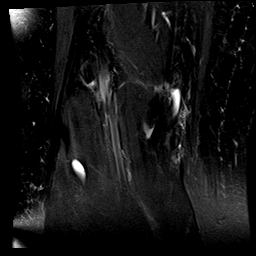
[im 25/25]
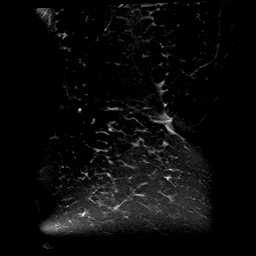

[Series 7: PD fat-sat · sagittal · 3.0mm · 0.29mm/px · 6 of 25 slices shown]
[im 1/25]
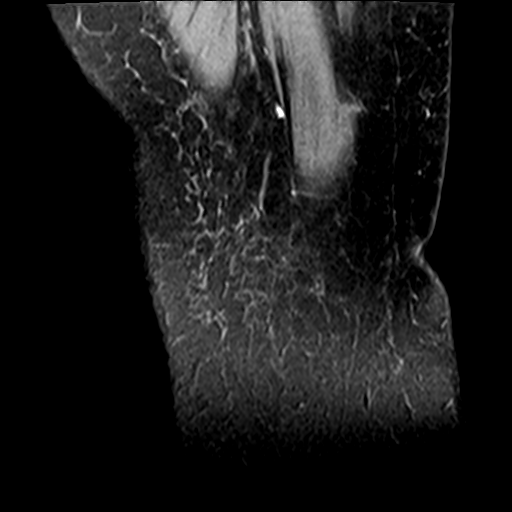
[im 5/25]
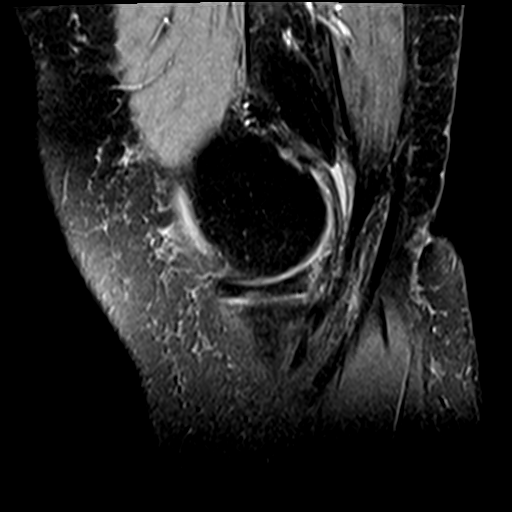
[im 10/25]
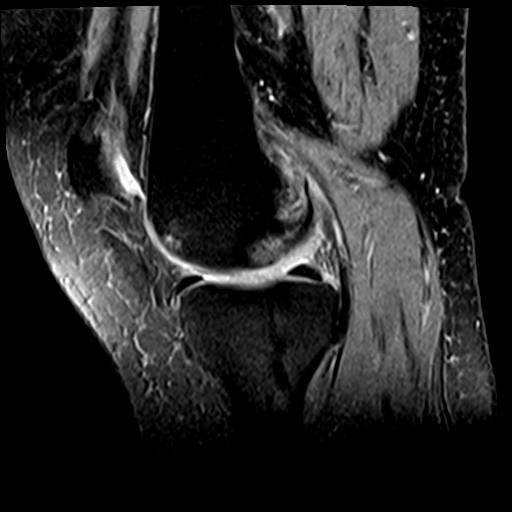
[im 15/25]
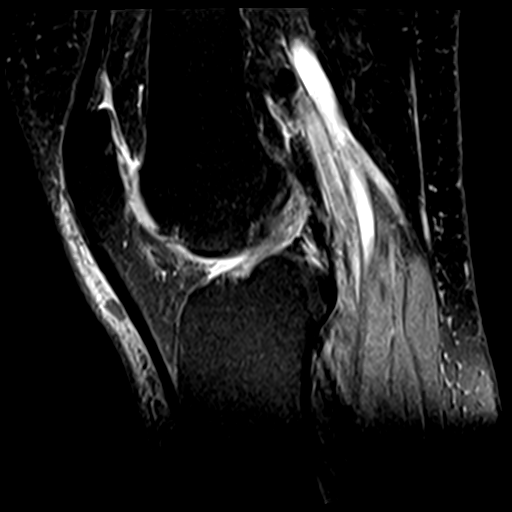
[im 20/25]
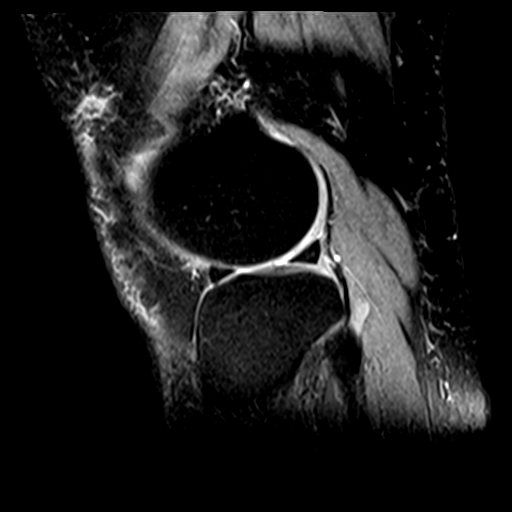
[im 25/25]
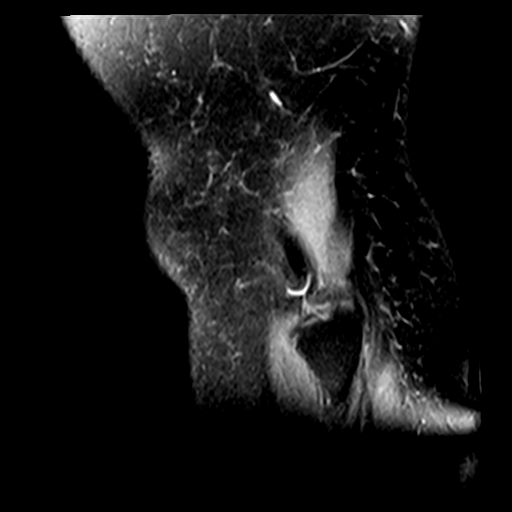

[Series 8: T2 fat-sat · sagittal · 3.0mm · 0.29mm/px · 6 of 25 slices shown (2 of 2)]
[im 1/25]
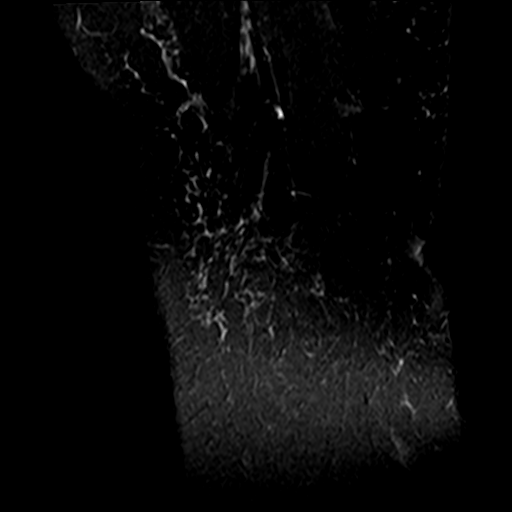
[im 5/25]
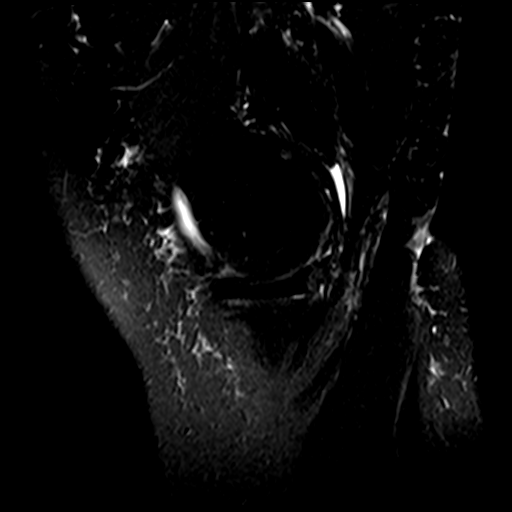
[im 10/25]
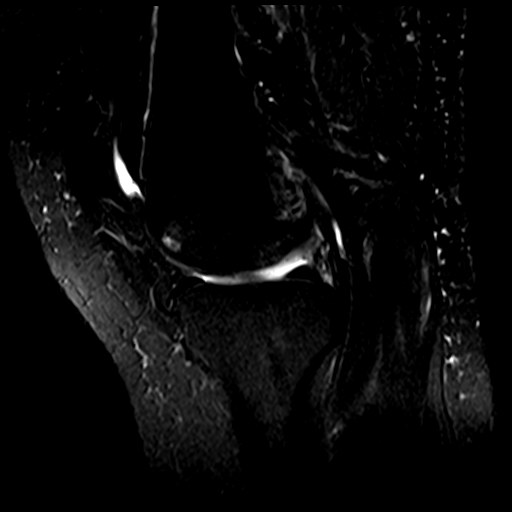
[im 15/25]
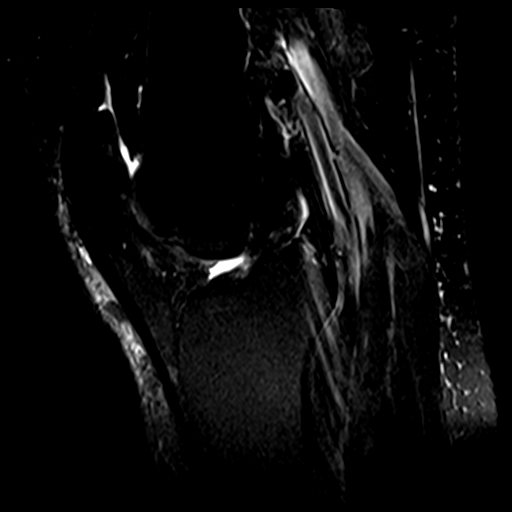
[im 20/25]
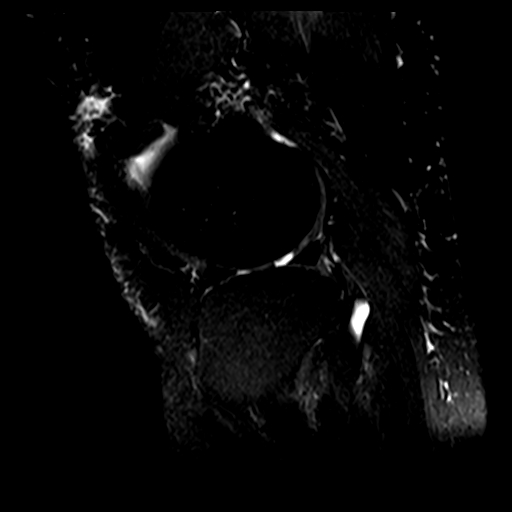
[im 25/25]
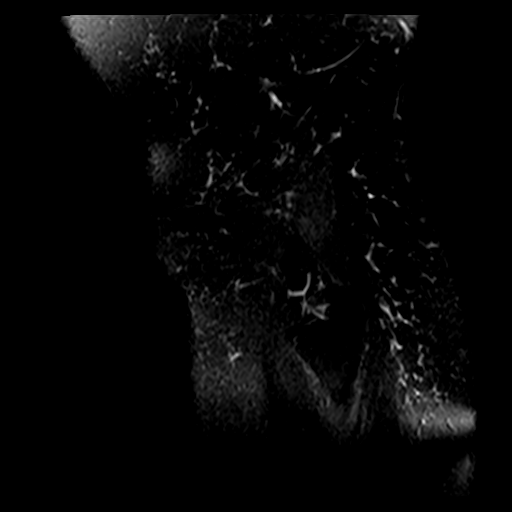

[23 of 40 positions shown; findings below may reference images not displayed]

FINDINGS: MENISCI

Medial meniscus: Intact. Mild degenerative fraying type changes
involving the free edge in the midbody area.

Lateral meniscus:  Intact.

LIGAMENTS

Cruciates:  Intact

Collaterals:  Intact

CARTILAGE

Patellofemoral: Moderate to advanced degenerative chondrosis with
areas of full-thickness cartilage loss and subchondral cystic change
(grade 4 chondromalacia). This is mainly around the patellar apex
and along the medial facet. Similar changes involving the medial
femoral trochlear notch area.

Medial: Moderate degenerative chondrosis with moderate areas of
cartilage thinning mainly involving the medial femoral condyle
articular cartilage. Early joint space narrowing and spurring.

Lateral: Small full-thickness cartilage defect involving the lateral
femoral condyle articular cartilage which measures approximately 5
mm.

Joint:  No joint effusion.

Popliteal Fossa: No popliteal mass or Baker's cyst. Small amount of
fluid noted in the tibiofibular joint space.

Extensor Mechanism: The patella retinacular structures are intact
and the quadriceps and patellar tendons are intact.

Bones:  No acute bony findings.

Other: Normal knee musculature.
IMPRESSION: 1. Intact ligamentous structures and no acute bony findings.
2. No meniscal tears.
3. Tricompartmental degenerative changes most significantly
involving the patellofemoral joint as detailed above.
4. No joint effusion or Baker's cyst.

## 2019-09-01 ENCOUNTER — Ambulatory Visit: Payer: Medicare Other | Admitting: Podiatry

## 2019-09-13 ENCOUNTER — Other Ambulatory Visit: Payer: Self-pay

## 2019-09-13 ENCOUNTER — Encounter: Payer: Self-pay | Admitting: Podiatry

## 2019-09-13 ENCOUNTER — Ambulatory Visit (INDEPENDENT_AMBULATORY_CARE_PROVIDER_SITE_OTHER): Payer: Medicare Other | Admitting: Podiatry

## 2019-09-13 ENCOUNTER — Ambulatory Visit (INDEPENDENT_AMBULATORY_CARE_PROVIDER_SITE_OTHER): Payer: Medicare Other

## 2019-09-13 DIAGNOSIS — I480 Paroxysmal atrial fibrillation: Secondary | ICD-10-CM

## 2019-09-13 DIAGNOSIS — D689 Coagulation defect, unspecified: Secondary | ICD-10-CM | POA: Diagnosis not present

## 2019-09-13 DIAGNOSIS — M79674 Pain in right toe(s): Secondary | ICD-10-CM | POA: Diagnosis not present

## 2019-09-13 DIAGNOSIS — I4891 Unspecified atrial fibrillation: Secondary | ICD-10-CM | POA: Diagnosis not present

## 2019-09-13 DIAGNOSIS — Z5181 Encounter for therapeutic drug level monitoring: Secondary | ICD-10-CM | POA: Diagnosis not present

## 2019-09-13 DIAGNOSIS — B351 Tinea unguium: Secondary | ICD-10-CM | POA: Diagnosis not present

## 2019-09-13 DIAGNOSIS — M79675 Pain in left toe(s): Secondary | ICD-10-CM | POA: Diagnosis not present

## 2019-09-13 LAB — POCT INR: INR: 2.6 (ref 2.0–3.0)

## 2019-09-13 NOTE — Progress Notes (Signed)
This patient returns to my office for at risk foot care.  This patient requires this care by a professional since this patient will be at risk due to having coagulation defect.  Patient is taking coumadin.  This patient is unable to cut nails herself since the patient cannot reach her nails.These nails are painful walking and wearing shoes.  This patient presents for at risk foot care today.  General Appearance  Alert, conversant and in no acute stress.  Vascular  Dorsalis pedis and posterior tibial  pulses are palpable  bilaterally.  Capillary return is within normal limits  bilaterally. Temperature is within normal limits  bilaterally.  Neurologic  Senn-Weinstein monofilament wire test within normal limits  bilaterally. Muscle power within normal limits bilaterally.  Nails Thick disfigured discolored nails with subungual debris  Second left and first and third right foot.  No evidence of bacterial infection or drainage bilaterally.  Orthopedic  No limitations of motion  feet .  No crepitus or effusions noted.  No bony pathology or digital deformities noted.  Skin  normotropic skin with no porokeratosis noted bilaterally.  No signs of infections or ulcers noted.     Onychomycosis  Pain in right toes  Pain in left toes  Consent was obtained for treatment procedures.   Mechanical debridement of nails 1-5  bilaterally performed with a nail nipper.  Filed with dremel without incident.    Return office visit   9 weeks                  Told patient to return for periodic foot care and evaluation due to potential at risk complications.   Limmie Schoenberg DPM  

## 2019-09-13 NOTE — Patient Instructions (Signed)
Continue on same dosage 4mg  daily except 6mg  on Tuesdays, Thursdays, and Saturdays. Recheck INR 8 weeks.  Call [facility] [phone] Main # [phone] with any questions

## 2019-09-26 ENCOUNTER — Other Ambulatory Visit: Payer: Self-pay

## 2019-09-26 ENCOUNTER — Inpatient Hospital Stay (HOSPITAL_BASED_OUTPATIENT_CLINIC_OR_DEPARTMENT_OTHER): Payer: Medicare Other | Admitting: Family

## 2019-09-26 ENCOUNTER — Inpatient Hospital Stay: Payer: Medicare Other | Attending: Hematology & Oncology

## 2019-09-26 ENCOUNTER — Encounter: Payer: Self-pay | Admitting: Family

## 2019-09-26 VITALS — BP 128/60 | HR 87 | Temp 99.1°F | Resp 17 | Wt 222.5 lb

## 2019-09-26 DIAGNOSIS — D509 Iron deficiency anemia, unspecified: Secondary | ICD-10-CM | POA: Insufficient documentation

## 2019-09-26 DIAGNOSIS — D500 Iron deficiency anemia secondary to blood loss (chronic): Secondary | ICD-10-CM

## 2019-09-26 DIAGNOSIS — K909 Intestinal malabsorption, unspecified: Secondary | ICD-10-CM | POA: Diagnosis not present

## 2019-09-26 LAB — CMP (CANCER CENTER ONLY)
ALT: 18 U/L (ref 0–44)
AST: 17 U/L (ref 15–41)
Albumin: 4.2 g/dL (ref 3.5–5.0)
Alkaline Phosphatase: 80 U/L (ref 38–126)
Anion gap: 9 (ref 5–15)
BUN: 17 mg/dL (ref 8–23)
CO2: 29 mmol/L (ref 22–32)
Calcium: 9.9 mg/dL (ref 8.9–10.3)
Chloride: 103 mmol/L (ref 98–111)
Creatinine: 0.78 mg/dL (ref 0.44–1.00)
GFR, Est AFR Am: >60 mL/min (ref >60)
GFR, Estimated: >60 mL/min (ref >60)
Glucose, Bld: 188 mg/dL — ABNORMAL HIGH (ref 70–99)
Potassium: 4.5 mmol/L (ref 3.5–5.1)
Sodium: 141 mmol/L (ref 135–145)
Total Bilirubin: 0.5 mg/dL (ref 0.3–1.2)
Total Protein: 7.1 g/dL (ref 6.5–8.1)

## 2019-09-26 LAB — IRON AND TIBC
Iron: 104 ug/dL (ref 41–142)
Saturation Ratios: 29 % (ref 21–57)
TIBC: 355 ug/dL (ref 236–444)
UIBC: 251 ug/dL (ref 120–384)

## 2019-09-26 LAB — CBC WITH DIFFERENTIAL (CANCER CENTER ONLY)
Abs Immature Granulocytes: 0.02 10*3/uL (ref 0.00–0.07)
Basophils Absolute: 0.1 10*3/uL (ref 0.0–0.1)
Basophils Relative: 1 %
Eosinophils Absolute: 0.3 10*3/uL (ref 0.0–0.5)
Eosinophils Relative: 4 %
HCT: 41.7 % (ref 36.0–46.0)
Hemoglobin: 13.9 g/dL (ref 12.0–15.0)
Immature Granulocytes: 0 %
Lymphocytes Relative: 41 %
Lymphs Abs: 2.6 10*3/uL (ref 0.7–4.0)
MCH: 30.8 pg (ref 26.0–34.0)
MCHC: 33.3 g/dL (ref 30.0–36.0)
MCV: 92.5 fL (ref 80.0–100.0)
Monocytes Absolute: 0.6 10*3/uL (ref 0.1–1.0)
Monocytes Relative: 9 %
Neutro Abs: 3 10*3/uL (ref 1.7–7.7)
Neutrophils Relative %: 45 %
Platelet Count: 324 10*3/uL (ref 150–400)
RBC: 4.51 MIL/uL (ref 3.87–5.11)
RDW: 12.5 % (ref 11.5–15.5)
WBC Count: 6.5 10*3/uL (ref 4.0–10.5)
nRBC: 0 % (ref 0.0–0.2)

## 2019-09-26 LAB — FERRITIN: Ferritin: 70 ng/mL (ref 11–307)

## 2019-09-26 NOTE — Progress Notes (Signed)
Hematology and Oncology Follow Up Visit  [name] [id number] [birth year] 64 y.o. 09/26/2019   Principle Diagnosis:  Iron deficiency anemia  Current Therapy:        IV ironas indicated   Interim History:  Ms. Lortz is here today for follow-up. She is doing well and has no complaints at this time.  She states that she will occasionally have fatigue and just takes and break to rest when needed.  She has dizziness associated with Vertigo and takes Meclizine as needed.  She has not noted any abnormal blood loss. No blood in the stool. No bruising or petechiae.  No fever, chills, n/v, cough, rash, SOB, chest pain, palpitations, abdominal pain or changes in bowel or bladder habits.  No swelling, tenderness, numbness or tingling in her extremities.  She got stung twice by a yellow jacket yesterday on the leg and foot. These areas are still a little red but she states they are looking better.  No falls or syncopal episodes to report.  She has maintained a good appetite and has switched to a healthier diet with smaller portions. She is hydrating well and her weight is stable (down 6 lbs since her last visit).  She is staying busy working in her vegetable garden.   ECOG Performance Status: 0 - Asymptomatic  Medications:  Allergies as of 09/26/2019      Reactions   Aspirin Other (See Comments)   Makes asthma worse and makes stomach hurt   Banana Swelling, Cough   Inside of the mouth swells   Food Other (See Comments), Cough   Nuts- mucus membranes inflamed/cough   Ivp Dye [iodinated Diagnostic Agents] Anaphylaxis   Required epinephrine. Since then, has tolerated with premed.   Latex Other (See Comments), Cough   Wheezing/cough   Meperidine Hcl Other (See Comments)   Projectile vomiting   Peanut-containing Drug Products Swelling, Cough   Inside of the mouth swells   Penicillins Anaphylaxis   Has patient had a PCN reaction causing immediate rash, facial/tongue/throat swelling,  SOB or lightheadedness with hypotension:Yes Has patient had a PCN reaction causing severe rash involving mucus membranes or skin necrosis:Yes Has patient had a PCN reaction that required hospitalization:Treated in ER w/epi & breathing treatments Has patient had a PCN reaction occurring within the last 10 years:No If all of the above answers are "NO", then may proceed with Cephalosporin use.   Statins Other (See Comments)   Severe leg pain - has tried most of them   Sulfonamide Derivatives Anaphylaxis   Tape Hives, Dermatitis   No plastic tape!!      Medication List       Accurate as of September 26, 2019  8:54 AM. If you have any questions, ask your nurse or doctor.        acetaminophen 650 MG CR tablet Commonly known as: TYLENOL Take 1,300 mg by mouth every 8 (eight) hours as needed for pain.   cetirizine 10 MG tablet Commonly known as: ZYRTEC Take 10 mg by mouth daily.   dicyclomine 10 MG capsule Commonly known as: BENTYL TAKE 1 CAPSULE BY MOUTH 30 MINS BEFORE MEALS AND AT BEDTIME AS NEEDED FOR PAIN/CRAMPING   Flovent HFA 220 MCG/ACT inhaler Generic drug: fluticasone Inhale 1 puff into the lungs 2 (two) times daily.   fluticasone 50 MCG/ACT nasal spray Commonly known as: FLONASE Place 1 spray into both nostrils 2 (two) times daily.   losartan 25 MG tablet Commonly known as: COZAAR Take 1 tablet (25  mg total) by mouth daily.   meclizine 25 MG tablet Commonly known as: ANTIVERT Take 1 tablet (25 mg total) by mouth 3 (three) times daily as needed for dizziness.   multivitamin with minerals Tabs tablet Take 1 tablet by mouth daily.   omeprazole 40 MG capsule Commonly known as: PRILOSEC Take 1 capsule (40 mg total) by mouth 2 (two) times daily. Must have an office visit for any further refills. Thank you   ProAir HFA 108 (90 Base) MCG/ACT inhaler Generic drug: albuterol Inhale 2 puffs into the lungs every 6 (six) hours as needed for wheezing or shortness of breath.     sucralfate 1 GM/10ML suspension Commonly known as: CARAFATE Take 10 mLs (1 g total) by mouth every 6 (six) hours as needed.   venlafaxine XR 37.5 MG 24 hr capsule Commonly known as: EFFEXOR-XR Take 37.5 mg by mouth daily with breakfast.   verapamil 80 MG tablet Commonly known as: CALAN Take 160 mg by mouth 2 (two) times daily.   Vitamin D3 25 MCG (1000 UT) Caps Take 2,000 Units by mouth daily.   warfarin 2 MG tablet Commonly known as: COUMADIN Take as directed by the anticoagulation clinic. If you are unsure how to take this medication, talk to your nurse or doctor. Original instructions: TAKE AS DIRECTED BY COUMADIN CLINIC       Allergies:  Allergies  Allergen Reactions  . Aspirin Other (See Comments)    Makes asthma worse and makes stomach hurt  . Banana Swelling and Cough    Inside of the mouth swells  . Food Other (See Comments) and Cough    Nuts- mucus membranes inflamed/cough   . Ivp Dye [Iodinated Diagnostic Agents] Anaphylaxis    Required epinephrine. Since then, has tolerated with premed.  . Latex Other (See Comments) and Cough    Wheezing/cough   . Meperidine Hcl Other (See Comments)    Projectile vomiting   . Peanut-Containing Drug Products Swelling and Cough    Inside of the mouth swells  . Penicillins Anaphylaxis    Has patient had a PCN reaction causing immediate rash, facial/tongue/throat swelling, SOB or lightheadedness with hypotension:Yes Has patient had a PCN reaction causing severe rash involving mucus membranes or skin necrosis:Yes Has patient had a PCN reaction that required hospitalization:Treated in ER w/epi & breathing treatments Has patient had a PCN reaction occurring within the last 10 years:No If all of the above answers are "NO", then may proceed with Cephalosporin use.   . Statins Other (See Comments)    Severe leg pain - has tried most of them  . Sulfonamide Derivatives Anaphylaxis  . Tape Hives and Dermatitis    No plastic  tape!!    Past Medical History, Surgical history, Social history, and Family History were reviewed and updated.  Review of Systems: All other 10 point review of systems is negative.   Physical Exam:  weight is 222 lb 8 oz (100.9 kg). Her oral temperature is 99.1 F (37.3 C). Her blood pressure is 128/60 and her pulse is 87. Her respiration is 17 and oxygen saturation is 95%.   Wt Readings from Last 3 Encounters:  09/26/19 222 lb 8 oz (100.9 kg)  03/27/19 228 lb 1.9 oz (103.5 kg)  02/27/19 230 lb (104.3 kg)    Ocular: Sclerae unicteric, pupils equal, round and reactive to light Ear-nose-throat: Oropharynx clear, dentition fair Lymphatic: No cervical or supraclavicular adenopathy Lungs no rales or rhonchi, good excursion bilaterally Heart regular rate and rhythm,  no murmur appreciated Abd soft, nontender, positive bowel sounds, no liver or spleen tip palpated on exam, no fluid wave  MSK no focal spinal tenderness, no joint edema Neuro: non-focal, well-oriented, appropriate affect Breasts: Deferred   Lab Results  Component Value Date   WBC 6.5 09/26/2019   HGB 13.9 09/26/2019   HCT 41.7 09/26/2019   MCV 92.5 09/26/2019   PLT 324 09/26/2019   Lab Results  Component Value Date   FERRITIN 42 03/27/2019   IRON 74 03/27/2019   TIBC 368 03/27/2019   UIBC 294 03/27/2019   IRONPCTSAT 20 (L) 03/27/2019   Lab Results  Component Value Date   RETICCTPCT 2.1 08/24/2018   RBC 4.51 09/26/2019   No results found for: KPAFRELGTCHN, LAMBDASER, KAPLAMBRATIO No results found for: IGGSERUM, IGA, IGMSERUM No results found for: Odetta Pink, SPEI   Chemistry      Component Value Date/Time   NA 141 09/26/2019 0812   NA 139 04/23/2016 1609   K 4.5 09/26/2019 0812   CL 103 09/26/2019 0812   CO2 29 09/26/2019 0812   BUN 17 09/26/2019 0812   BUN 17 04/23/2016 1609   CREATININE 0.78 09/26/2019 0812   CREATININE 0.71 05/02/2015 1000       Component Value Date/Time   CALCIUM 9.9 09/26/2019 0812   ALKPHOS 80 09/26/2019 0812   AST 17 09/26/2019 0812   ALT 18 09/26/2019 0812   BILITOT 0.5 09/26/2019 0812       Impression and Plan: Ms. Gossard is a very pleasant 63 yo caucasian female with iron deficiency anemia.  We will see what her iron studies show and bring her back in for infusion if needed.  We will plan to see her back in another 6 months.  She will contact our office with any questions or concerns. We can certainly see her sooner if needed.   Laverna Peace, NP 7/6/20218:54 AM

## 2019-10-09 ENCOUNTER — Other Ambulatory Visit: Payer: Self-pay | Admitting: Internal Medicine

## 2019-11-08 ENCOUNTER — Ambulatory Visit (INDEPENDENT_AMBULATORY_CARE_PROVIDER_SITE_OTHER): Payer: Medicare Other | Admitting: Pharmacist

## 2019-11-08 ENCOUNTER — Other Ambulatory Visit: Payer: Self-pay

## 2019-11-08 DIAGNOSIS — I480 Paroxysmal atrial fibrillation: Secondary | ICD-10-CM

## 2019-11-08 DIAGNOSIS — I4891 Unspecified atrial fibrillation: Secondary | ICD-10-CM

## 2019-11-08 DIAGNOSIS — Z5181 Encounter for therapeutic drug level monitoring: Secondary | ICD-10-CM

## 2019-11-08 LAB — POCT INR: INR: 2.7 (ref 2.0–3.0)

## 2019-11-08 NOTE — Patient Instructions (Signed)
Description   Continue on same dosage 4mg  daily except 6mg  on Tuesdays, Thursdays, and Saturdays. Recheck INR 8 weeks.  Call [facility] [phone] Main # [phone] with any questions

## 2019-11-22 ENCOUNTER — Ambulatory Visit (INDEPENDENT_AMBULATORY_CARE_PROVIDER_SITE_OTHER): Payer: Medicare Other | Admitting: Podiatry

## 2019-11-22 ENCOUNTER — Encounter: Payer: Self-pay | Admitting: Podiatry

## 2019-11-22 ENCOUNTER — Other Ambulatory Visit: Payer: Self-pay

## 2019-11-22 DIAGNOSIS — M79674 Pain in right toe(s): Secondary | ICD-10-CM

## 2019-11-22 DIAGNOSIS — M79675 Pain in left toe(s): Secondary | ICD-10-CM | POA: Diagnosis not present

## 2019-11-22 DIAGNOSIS — B351 Tinea unguium: Secondary | ICD-10-CM | POA: Diagnosis not present

## 2019-11-22 DIAGNOSIS — D689 Coagulation defect, unspecified: Secondary | ICD-10-CM

## 2019-11-22 NOTE — Progress Notes (Signed)
This patient returns to my office for at risk foot care.  This patient requires this care by a professional since this patient will be at risk due to having coagulation defect.  Patient is taking coumadin.  This patient is unable to cut nails herself since the patient cannot reach her nails.These nails are painful walking and wearing shoes.  This patient presents for at risk foot care today.  General Appearance  Alert, conversant and in no acute stress.  Vascular  Dorsalis pedis and posterior tibial  pulses are palpable  bilaterally.  Capillary return is within normal limits  bilaterally. Temperature is within normal limits  bilaterally.  Neurologic  Senn-Weinstein monofilament wire test within normal limits  bilaterally. Muscle power within normal limits bilaterally.  Nails Thick disfigured discolored nails with subungual debris  Second left and first and third right foot.  No evidence of bacterial infection or drainage bilaterally.  Orthopedic  No limitations of motion  feet .  No crepitus or effusions noted.  No bony pathology or digital deformities noted.  Skin  normotropic skin with no porokeratosis noted bilaterally.  No signs of infections or ulcers noted.     Onychomycosis  Pain in right toes  Pain in left toes  Consent was obtained for treatment procedures.   Mechanical debridement of nails 1-5  bilaterally performed with a nail nipper.  Filed with dremel without incident.    Return office visit   9 weeks                  Told patient to return for periodic foot care and evaluation due to potential at risk complications.   Burle Kwan DPM  

## 2019-12-14 ENCOUNTER — Emergency Department (HOSPITAL_BASED_OUTPATIENT_CLINIC_OR_DEPARTMENT_OTHER)
Admission: EM | Admit: 2019-12-14 | Discharge: 2019-12-14 | Disposition: A | Discharge status: Home / Self Care | Payer: Medicare Other | Attending: Emergency Medicine | Admitting: Emergency Medicine

## 2019-12-14 ENCOUNTER — Emergency Department (HOSPITAL_BASED_OUTPATIENT_CLINIC_OR_DEPARTMENT_OTHER): Payer: Medicare Other

## 2019-12-14 ENCOUNTER — Other Ambulatory Visit: Payer: Self-pay

## 2019-12-14 ENCOUNTER — Encounter (HOSPITAL_BASED_OUTPATIENT_CLINIC_OR_DEPARTMENT_OTHER): Payer: Self-pay | Admitting: *Deleted

## 2019-12-14 DIAGNOSIS — I10 Essential (primary) hypertension: Secondary | ICD-10-CM | POA: Diagnosis not present

## 2019-12-14 DIAGNOSIS — Z79899 Other long term (current) drug therapy: Secondary | ICD-10-CM | POA: Insufficient documentation

## 2019-12-14 DIAGNOSIS — Z91010 Allergy to peanuts: Secondary | ICD-10-CM | POA: Diagnosis not present

## 2019-12-14 DIAGNOSIS — Z91040 Latex allergy status: Secondary | ICD-10-CM | POA: Insufficient documentation

## 2019-12-14 DIAGNOSIS — S99922A Unspecified injury of left foot, initial encounter: Secondary | ICD-10-CM | POA: Diagnosis present

## 2019-12-14 DIAGNOSIS — J45909 Unspecified asthma, uncomplicated: Secondary | ICD-10-CM | POA: Diagnosis not present

## 2019-12-14 DIAGNOSIS — Z7951 Long term (current) use of inhaled steroids: Secondary | ICD-10-CM | POA: Insufficient documentation

## 2019-12-14 DIAGNOSIS — S92325A Nondisplaced fracture of second metatarsal bone, left foot, initial encounter for closed fracture: Secondary | ICD-10-CM | POA: Diagnosis not present

## 2019-12-14 DIAGNOSIS — Y92810 Car as the place of occurrence of the external cause: Secondary | ICD-10-CM | POA: Insufficient documentation

## 2019-12-14 DIAGNOSIS — Y9389 Activity, other specified: Secondary | ICD-10-CM | POA: Diagnosis not present

## 2019-12-14 DIAGNOSIS — Z7901 Long term (current) use of anticoagulants: Secondary | ICD-10-CM | POA: Insufficient documentation

## 2019-12-14 DIAGNOSIS — W228XXA Striking against or struck by other objects, initial encounter: Secondary | ICD-10-CM | POA: Diagnosis not present

## 2019-12-14 MED ORDER — OXYCODONE-ACETAMINOPHEN 5-325 MG PO TABS: 1.0000 | ORAL_TABLET | ORAL | 0 refills | Status: DC | PRN

## 2019-12-14 MED ORDER — OXYCODONE-ACETAMINOPHEN 5-325 MG PO TABS
1.0000 | ORAL_TABLET | Freq: Once | ORAL | Status: AC
  Administered 2019-12-14: 1 via ORAL
  Filled 2019-12-14: qty 1

## 2019-12-14 NOTE — ED Provider Notes (Signed)
[facility] EMERGENCY DEPARTMENT Provider Note   CSN: [id number] Arrival date & time: 12/14/19  1549     History Chief Complaint  Patient presents with   Foot Injury    [name] is a 64 y.o. female past history of a flutter (on Coumadin) who presents for evaluation of left foot pain after mechanical fall that occurred this afternoon approximately 2 PM.  She reports that they were adjusting a trailer hook her car when the jack fell, causing the trailer hooked to land on her foot.  She also reports that this caused her to fall over.  She states she did not hit her head or have any LOC.  She is currently on Coumadin.  She states she has not had any vomiting, numbness/weakness, vision changes.  She states that since this incident, she has been unable to bear weight on her foot.  She reports some decrease sensation noted to the lateral aspect that goes over to the top of the foot.  She has not taken anything for pain.  She denies any weakness.  The history is provided by the patient.       Past Medical History:  Diagnosis Date   Allergic rhinitis    Arthritis    "back" (05/07/2016)   Asthma    Atrial flutter ([place])    a. Remotely per notes.   Carpal tunnel syndrome, bilateral    Colon polyps    Diverticulitis    DVT (deep venous thrombosis) ([place]) 1980s x2 -    between birth of two sons. Saw hematology - was told she has a hypercoagulable disorder, should be on Coumadin lifelong.   Dysrhythmia    atrial fib   Family history of adverse reaction to anesthesia    "mother gets PONV"   Gallbladder disease    GERD (gastroesophageal reflux disease)    GIB (gastrointestinal bleeding) 04/14/2018   Halothane adverse reaction    narrow small opening   Heart murmur    Hiatal hernia    History of hiatal hernia    "small one/CTA 05/01/2016" (05/07/2016)   HTN (hypertension)    Hypercholesteremia    hx; "brought it down w/diet" (05/07/2016)   Hyperglycemia     a. A1c 6.1 in 2014.   Inguinal hernia    Iron deficiency anemia due to chronic blood loss 04/14/2018   Iron malabsorption 04/14/2018   Left sided sciatica    Normal cardiac stress test 06/2012   Obesity    OSA (obstructive sleep apnea)    a. Mild, did not tolerate CPAP (05/07/2016)   PAF (paroxysmal atrial fibrillation) ([place])    a. s/p afib ablation at [facility] 2010 (Dr. Annabell Howells). b. Recurrent AF s/p DCCV 06/2010. c. On flecainide.    Paroxysmal atrial fibrillation ([place]) 09/09/2009   Qualifier: Diagnosis of  By: Selena Batten CMA, Jewel     PONV (postoperative nausea and vomiting)    PPD positive, treated 1977   "treated for 1 yr after exposure to patient"   Pre-diabetes    RSV infection ~ 2006   Wandering (atrial) pacemaker    a. Remotely per notes.  (  pt. states has a wandering p wave ) no pacemaker    Patient Active Problem List   Diagnosis Date Noted   Abdominal pain, left lower quadrant 10/27/2018   Loose stools 10/27/2018   Iron deficiency anemia due to chronic blood loss 04/14/2018   Iron malabsorption 04/14/2018   GIB (gastrointestinal bleeding) 04/14/2018   Chronic  anticoagulation (warfarin for Afib) 08/06/2017   Gastric polyp    Fall 07/08/2017   Scalp laceration 07/08/2017   Hypokalemia 07/08/2017   Recurrent diverticulitis s/p robotic sigmoid colectomy 08/06/2017 11/24/2016   Right hip pain 05/14/2015   Obesity (BMI 30-39.9) 01/10/2014   Sleep apnea 01/10/2014   Encounter for therapeutic drug monitoring 04/26/2013   Essential hypertension 03/01/2013   History of DVT (deep vein thrombosis) 01/12/2013   Hyperglycemia 01/12/2013   Dizziness 07/09/2010   HYPERCHOLESTEROLEMIA 09/10/2009   Carpal tunnel syndrome 09/10/2009   Asthma 09/10/2009   TMJ SYNDROME 09/10/2009   GERD 09/10/2009   GALLBLADDER DISEASE 09/10/2009    Past Surgical History:  Procedure Laterality Date   ATRIAL ABLATION SURGERY  05/07/2016   "fib and  flutter"   ATRIAL FIBRILLATION ABLATION  2010   at [facility] N/A 05/07/2016   Procedure: Atrial Fibrillation Ablation;  Surgeon: Thompson Grayer, MD;  Location: [facility];  Service: Cardiovascular;  Laterality: N/A;   BREAST CYST ASPIRATION Bilateral    CARDIAC CATHETERIZATION  2008   CARPAL TUNNEL RELEASE Bilateral    CESAREAN SECTION  1986; 1988   COLONOSCOPY  X 2   COLONOSCOPY W/ BIOPSIES AND POLYPECTOMY  X 2   ESOPHAGOGASTRODUODENOSCOPY     ESOPHAGOGASTRODUODENOSCOPY (EGD) WITH PROPOFOL N/A 07/19/2017   Procedure: ESOPHAGOGASTRODUODENOSCOPY (EGD) WITH PROPOFOL;  Surgeon: Yetta Flock, MD;  Location: [facility];  Service: Gastroenterology;  Laterality: N/A;   INGUINAL HERNIA REPAIR Right    LAPAROSCOPIC CHOLECYSTECTOMY  1989   POLYPECTOMY N/A 07/19/2017   Procedure: POLYPECTOMY;  Surgeon: Yetta Flock, MD;  Location: [facility];  Service: Gastroenterology;  Laterality: N/A;   PROCTOSCOPY N/A 08/06/2017   Procedure: PROCTOSCOPY;  Surgeon: Michael Boston, MD;  Location: [facility];  Service: General;  Laterality: N/A;   TMJ ARTHROPLASTY     TUBAL LIGATION  1988     OB History   No obstetric history on file.     Family History  Problem Relation Age of Onset   Stroke Mother    Heart attack Mother        2 previous MIs, 3 stents - CAD first diagnosed 52   Thyroid disease Mother    Colon polyps Father    Lupus Sister    Heart attack Paternal Grandfather        Died at 44 of massive MI   Stroke Maternal Grandfather        Multiple family members   Stroke Paternal Grandmother    Thyroid disease Sister        x 2   Colon cancer Neg Hx    Stomach cancer Neg Hx     Social History   Tobacco Use   Smoking status: Never Smoker   Smokeless tobacco: Never Used  Vaping Use   Vaping Use: Never used  Substance Use Topics   Alcohol use: Not Currently    Comment: 07-13-17   Drug use: No     Home Medications Prior to Admission medications   Medication Sig Start Date End Date Taking? Authorizing Provider  acetaminophen (TYLENOL) 650 MG CR tablet Take 1,300 mg by mouth every 8 (eight) hours as needed for pain.     [provider]  cetirizine (ZYRTEC) 10 MG tablet Take 10 mg by mouth daily.      [provider]  Cholecalciferol (VITAMIN D3) 1000 UNITS CAPS Take 2,000 Units by mouth daily.     [provider]  dicyclomine (BENTYL) 10 MG capsule TAKE 1 CAPSULE BY MOUTH 30 MINS BEFORE MEALS AND AT BEDTIME AS NEEDED FOR PAIN/CRAMPING 12/23/18   Zehr, Janett Billow D, PA-C  FLOVENT HFA 220 MCG/ACT inhaler Inhale 1 puff into the lungs 2 (two) times daily. 04/20/16   [provider]  fluticasone (FLONASE) 50 MCG/ACT nasal spray Place 1 spray into both nostrils 2 (two) times daily.  06/02/12   [provider]  losartan (COZAAR) 25 MG tablet Take 1 tablet (25 mg total) by mouth daily. 12/20/13   Allred, Jeneen Rinks, MD  meclizine (ANTIVERT) 25 MG tablet Take 1 tablet (25 mg total) by mouth 3 (three) times daily as needed for dizziness. 01/19/18   Dorie Rank, MD  Multiple Vitamin (MULTIVITAMIN WITH MINERALS) TABS tablet Take 1 tablet by mouth daily.    [provider]  omeprazole (PRILOSEC) 40 MG capsule Take 1 capsule (40 mg total) by mouth 2 (two) times daily. Must have an office visit for any further refills. Thank you 05/03/19   Armbruster, Carlota Raspberry, MD  oxyCODONE-acetaminophen (PERCOCET/ROXICET) 5-325 MG tablet Take 1-2 tablets by mouth every 4 (four) hours as needed for severe pain. 12/14/19   Volanda Napoleon, PA-C  PROAIR HFA 108 (90 BASE) MCG/ACT inhaler Inhale 2 puffs into the lungs every 6 (six) hours as needed for wheezing or shortness of breath.  03/22/12   [provider]  sucralfate (CARAFATE) 1 GM/10ML suspension Take 10 mLs (1 g total) by mouth every 6 (six) hours as needed. 06/15/17   Yetta Flock, MD  venlafaxine XR  (EFFEXOR-XR) 37.5 MG 24 hr capsule Take 37.5 mg by mouth daily with breakfast.  11/21/12   [provider]  verapamil (CALAN) 80 MG tablet Take 160 mg by mouth 2 (two) times daily.    [provider]  warfarin (COUMADIN) 2 MG tablet TAKE AS DIRECTED BY COUMADIN CLINIC 10/09/19   Allred, Jeneen Rinks, MD    Allergies    Aspirin, Banana, Food, Ivp dye [iodinated diagnostic agents], Latex, Meperidine hcl, Peanut-containing drug products, Penicillins, Statins, Sulfonamide derivatives, and Tape  Review of Systems   Review of Systems  Eyes: Negative for visual disturbance.  Musculoskeletal:       Foot pain  Neurological: Positive for numbness (foot). Negative for weakness.  All other systems reviewed and are negative.   Physical Exam Updated Vital Signs BP (!) 139/100 (BP Location: Right Arm)    Pulse 64    Temp 97.9 F (36.6 C) (Oral)    Resp 18    Ht 5\' 4"  (1.626 m)    Wt 90.7 kg    SpO2 99%    BMI 34.33 kg/m   Physical Exam Vitals and nursing note reviewed.  Constitutional:      Appearance: She is well-developed.  HENT:     Head: Normocephalic and atraumatic.  Eyes:     General: No scleral icterus.       Right eye: No discharge.        Left eye: No discharge.     Conjunctiva/sclera: Conjunctivae normal.  Cardiovascular:     Pulses:          Dorsalis pedis pulses are 2+ on the right side and 2+ on the left side.  Pulmonary:     Effort: Pulmonary effort is normal.  Musculoskeletal:     Comments: Tenderness palpation the dorsal aspect of the left foot with overlying soft tissue swelling and ecchymosis.  No deformity or crepitus noted.  Dorsiflexion  plantarflexion intact.  No tenderness palpation noted distal tib-fib, proximal tib-fib.  No tenderness palpation noted to right lower extremity.  Skin:    General: Skin is warm and dry.     Comments: Good distal cap refill.  LLE is not dusky in appearance or cool to touch.  Neurological:     Mental Status: She is alert.      Comments: She reports some decrease sensation noted to the lateral aspect into the toes #4, 5.  She can feel me touching her but states it feels different.  Psychiatric:        Speech: Speech normal.        Behavior: Behavior normal.     ED Results / Procedures / Treatments   Labs (all labs ordered are listed, but only abnormal results are displayed) Labs Reviewed - No data to display  EKG None  Radiology DG Foot Complete Left  Result Date: 12/14/2019 CLINICAL DATA:  Crush injury to the left foot. Left foot pain around third metatarsal. EXAM: LEFT FOOT - COMPLETE 3+ VIEW COMPARISON:  None. FINDINGS: Suspected transverse nondisplaced fracture of the proximal second metatarsal shaft. There may also be a small avulsion fracture from the base of the second metatarsal in the region of the Lisfranc ligament insertion. No Lisfranc joint widening. Trace spurring of the first metatarsal phalangeal joint. Alignment is maintained. Small plantar calcaneal spur. Soft tissue edema overlies the dorsum of the foot. IMPRESSION: 1. Suspected transverse nondisplaced fracture of the proximal second metatarsal shaft. 2. Possible small avulsion fracture from the base of the second metatarsal in the region of the Lisfranc ligament insertion. Recommend orthopedic follow-up and consideration of cross-sectional imaging. Electronically Signed   By: Keith Rake M.D.   On: 12/14/2019 16:49    Procedures Procedures (including critical care time)  Medications Ordered in ED Medications  oxyCODONE-acetaminophen (PERCOCET/ROXICET) 5-325 MG per tablet 1 tablet (1 tablet Oral Given 12/14/19 1804)    ED Course  I have reviewed the triage vital signs and the nursing notes.  Pertinent labs & imaging results that were available during my care of the patient were reviewed by me and considered in my medical decision making (see chart for details).    MDM Rules/Calculators/A&P                          64 year old  female who presents for evaluation of left foot pain after mechanical fall.  She reports she is on Coumadin.  She states she did not hit her head or have any LOC.  On initial arrival, she is afebrile, nontoxic-appearing.  Vital signs are stable.  No neuro deficits on exam.  Concern for fracture versus contusion versus dislocation.  X-rays ordered at triage.  I did discuss with patient risk of falling while on Coumadin.  Patient assures me that her husband witnessed the episode and states she did not hit her head or lose LOC.  I did offer her head CT.  Patient states she did not want one and declined.  Patient with no neuro deficits on exam.  X-ray shows a suspected transverse nondisplaced fracture of the proximal second metatarsal shaft.  There is a possible small avulsion fracture from the base of the second metatarsal in the region of the Lisfranc ligament insertion.  Discussed patient with Dr. Mardelle Matte (ortho) who recommends patient placing patient in a cam walker boot with weightbearing as tolerated.  Patient to follow-up in the office next week.  Discussed results with patient.  Patient already has a cam walker boot from previous tendon injury.  We will plan to give her short course of pain medication.  Patient struck to follow-up with Weston Anna who she is already established with. At this time, patient exhibits no emergent life-threatening condition that require further evaluation in ED. Patient had ample opportunity for questions and discussion. All patient's questions were answered with full understanding. Strict return precautions discussed. Patient expresses understanding and agreement to plan.   Portions of this note were generated with [employer]. Dictation errors may occur despite best attempts at proofreading.   Final Clinical Impression(s) / ED Diagnoses Final diagnoses:  Nondisplaced fracture of second metatarsal bone, left foot, initial encounter for closed fracture     Rx / DC Orders ED Discharge Orders         Ordered    oxyCODONE-acetaminophen (PERCOCET/ROXICET) 5-325 MG tablet  Every 4 hours PRN        12/14/19 1803           Volanda Napoleon, PA-C 12/14/19 Vena Rua, MD 12/24/19 1221

## 2019-12-14 NOTE — Discharge Instructions (Addendum)
You can take Tylenol or Ibuprofen as directed for pain. You can alternate Tylenol and Ibuprofen every 4 hours. If you take Tylenol at 1pm, then you can take Ibuprofen at 5pm. Then you can take Tylenol again at 9pm.   Take pain medications as directed for break through pain. Do not drive or operate machinery while taking this medication.   Follow the RICE (Rest, Ice, Compression, Elevation) protocol as directed.   Wear the cam walker boot at all times when you are up and walking.  As we discussed, you can be weightbearing as tolerated.  Call [facility] tomorrow morning to arrange for an outpatient appointment.  Return the emergency room for any worsening pain, worsening numbness/weakness or any other worsening concerning symptoms.

## 2019-12-14 NOTE — ED Triage Notes (Signed)
C/o left foot crushing injury x 1 hr ago

## 2019-12-14 NOTE — ED Notes (Signed)
Called [facility] for [facility]o consult.  They will page

## 2019-12-21 ENCOUNTER — Encounter (HOSPITAL_COMMUNITY): Payer: Self-pay | Admitting: Emergency Medicine

## 2019-12-21 ENCOUNTER — Emergency Department (HOSPITAL_COMMUNITY)
Admission: EM | Admit: 2019-12-21 | Discharge: 2019-12-21 | Disposition: A | Discharge status: Home / Self Care | Payer: Medicare Other | Attending: Emergency Medicine | Admitting: Emergency Medicine

## 2019-12-21 ENCOUNTER — Other Ambulatory Visit: Payer: Self-pay

## 2019-12-21 ENCOUNTER — Emergency Department (HOSPITAL_COMMUNITY): Payer: Medicare Other

## 2019-12-21 DIAGNOSIS — Z950 Presence of cardiac pacemaker: Secondary | ICD-10-CM | POA: Diagnosis not present

## 2019-12-21 DIAGNOSIS — Z96698 Presence of other orthopedic joint implants: Secondary | ICD-10-CM | POA: Insufficient documentation

## 2019-12-21 DIAGNOSIS — I10 Essential (primary) hypertension: Secondary | ICD-10-CM | POA: Insufficient documentation

## 2019-12-21 DIAGNOSIS — J45909 Unspecified asthma, uncomplicated: Secondary | ICD-10-CM | POA: Diagnosis not present

## 2019-12-21 DIAGNOSIS — Z86010 Personal history of colonic polyps: Secondary | ICD-10-CM | POA: Insufficient documentation

## 2019-12-21 DIAGNOSIS — N132 Hydronephrosis with renal and ureteral calculous obstruction: Secondary | ICD-10-CM

## 2019-12-21 DIAGNOSIS — Z79899 Other long term (current) drug therapy: Secondary | ICD-10-CM | POA: Diagnosis not present

## 2019-12-21 DIAGNOSIS — Z7951 Long term (current) use of inhaled steroids: Secondary | ICD-10-CM | POA: Insufficient documentation

## 2019-12-21 DIAGNOSIS — Z91040 Latex allergy status: Secondary | ICD-10-CM | POA: Insufficient documentation

## 2019-12-21 DIAGNOSIS — Z7901 Long term (current) use of anticoagulants: Secondary | ICD-10-CM | POA: Insufficient documentation

## 2019-12-21 DIAGNOSIS — Z91010 Allergy to peanuts: Secondary | ICD-10-CM | POA: Insufficient documentation

## 2019-12-21 DIAGNOSIS — R1012 Left upper quadrant pain: Secondary | ICD-10-CM | POA: Diagnosis present

## 2019-12-21 LAB — CBC
HCT: 36.2 % (ref 36.0–46.0)
Hemoglobin: 12 g/dL (ref 12.0–15.0)
MCH: 30.7 pg (ref 26.0–34.0)
MCHC: 33.1 g/dL (ref 30.0–36.0)
MCV: 92.6 fL (ref 80.0–100.0)
Platelets: 329 10*3/uL (ref 150–400)
RBC: 3.91 MIL/uL (ref 3.87–5.11)
RDW: 12.6 % (ref 11.5–15.5)
WBC: 8.3 10*3/uL (ref 4.0–10.5)
nRBC: 0 % (ref 0.0–0.2)

## 2019-12-21 LAB — LIPASE, BLOOD: Lipase: 20 U/L (ref 11–51)

## 2019-12-21 LAB — URINALYSIS, ROUTINE W REFLEX MICROSCOPIC
Bacteria, UA: NONE SEEN
Bilirubin Urine: NEGATIVE
Glucose, UA: NEGATIVE mg/dL
Ketones, ur: NEGATIVE mg/dL
Leukocytes,Ua: NEGATIVE
Nitrite: NEGATIVE
Protein, ur: NEGATIVE mg/dL
Specific Gravity, Urine: 1.013 (ref 1.005–1.030)
pH: 6 (ref 5.0–8.0)

## 2019-12-21 LAB — COMPREHENSIVE METABOLIC PANEL
ALT: 25 U/L (ref 0–44)
AST: 20 U/L (ref 15–41)
Albumin: 3.5 g/dL (ref 3.5–5.0)
Alkaline Phosphatase: 64 U/L (ref 38–126)
Anion gap: 9 (ref 5–15)
BUN: 13 mg/dL (ref 8–23)
CO2: 28 mmol/L (ref 22–32)
Calcium: 8.9 mg/dL (ref 8.9–10.3)
Chloride: 102 mmol/L (ref 98–111)
Creatinine, Ser: 0.59 mg/dL (ref 0.44–1.00)
GFR calc Af Amer: >60 mL/min (ref >60)
GFR calc non Af Amer: >60 mL/min (ref >60)
Glucose, Bld: 165 mg/dL — ABNORMAL HIGH (ref 70–99)
Potassium: 3.9 mmol/L (ref 3.5–5.1)
Sodium: 139 mmol/L (ref 135–145)
Total Bilirubin: 0.5 mg/dL (ref 0.3–1.2)
Total Protein: 6.6 g/dL (ref 6.5–8.1)

## 2019-12-21 MED ORDER — ONDANSETRON HCL 4 MG/2ML IJ SOLN
4.0000 mg | Freq: Once | INTRAMUSCULAR | Status: AC
  Administered 2019-12-21: 4 mg via INTRAVENOUS
  Filled 2019-12-21: qty 2

## 2019-12-21 MED ORDER — FENTANYL CITRATE (PF) 100 MCG/2ML IJ SOLN
50.0000 ug | Freq: Once | INTRAMUSCULAR | Status: AC
  Administered 2019-12-21: 50 ug via INTRAVENOUS
  Filled 2019-12-21: qty 2

## 2019-12-21 MED ORDER — ONDANSETRON 8 MG PO TBDP: 8.0000 mg | ORAL_TABLET | Freq: Three times a day (TID) | ORAL | 0 refills | Status: DC | PRN

## 2019-12-21 NOTE — ED Provider Notes (Signed)
[facility] EMERGENCY DEPARTMENT Provider Note   CSN: [id number] Arrival date & time: 12/21/19  0156     History Chief Complaint  Patient presents with  . Abdominal Pain    [name] is a 64 y.o. female.  The history is provided by the patient.  Abdominal Pain Pain location:  LUQ Pain quality: sharp   Pain radiates to:  L flank Pain severity:  Moderate Onset quality:  Sudden Duration:  4 hours Timing:  Constant Progression:  Unchanged Chronicity:  New Relieved by:  Nothing Worsened by:  Palpation Associated symptoms: nausea and vomiting   Associated symptoms: no chest pain, no fever and no shortness of breath       Patient with history of DVT, a flutter, on Coumadin, recent history of left foot crush injury presents with left upper quadrant abdominal pain. Patient reports that she had sudden onset of pain approximately 3 to 4 hours ago. The pain starts in her left upper abdomen into her flank.  No fevers, but she does have vomiting.  No diarrhea.  No chest pain/shortness of breath. Past Medical History:  Diagnosis Date  . Allergic rhinitis   . Arthritis    "back" (05/07/2016)  . Asthma   . Atrial flutter ([place])    a. Remotely per notes.  . Carpal tunnel syndrome, bilateral   . Colon polyps   . Diverticulitis   . DVT (deep venous thrombosis) ([place]) 1980s x2 -    between birth of two sons. Saw hematology - was told she has a hypercoagulable disorder, should be on Coumadin lifelong.  Marland Kitchen Dysrhythmia    atrial fib  . Family history of adverse reaction to anesthesia    "mother gets PONV"  . Gallbladder disease   . GERD (gastroesophageal reflux disease)   . GIB (gastrointestinal bleeding) 04/14/2018  . Halothane adverse reaction    narrow small opening  . Heart murmur   . Hiatal hernia   . History of hiatal hernia    "small one/CTA 05/01/2016" (05/07/2016)  . HTN (hypertension)   . Hypercholesteremia    hx; "brought it down w/diet" (05/07/2016)  .  Hyperglycemia    a. A1c 6.1 in 2014.  . Inguinal hernia   . Iron deficiency anemia due to chronic blood loss 04/14/2018  . Iron malabsorption 04/14/2018  . Left sided sciatica   . Normal cardiac stress test 06/2012  . Obesity   . OSA (obstructive sleep apnea)    a. Mild, did not tolerate CPAP (05/07/2016)  . PAF (paroxysmal atrial fibrillation) ([place])    a. s/p afib ablation at [facility] 2010 (Dr. Annabell Howells). b. Recurrent AF s/p DCCV 06/2010. c. On flecainide.   . Paroxysmal atrial fibrillation ([place]) 09/09/2009   Qualifier: Diagnosis of  By: Selena Batten CMA, [place]    . PONV (postoperative nausea and vomiting)   . PPD positive, treated 1977   "treated for 1 yr after exposure to patient"  . Pre-diabetes   . RSV infection ~ 2006  . Wandering (atrial) pacemaker    a. Remotely per notes.  (  pt. states has a wandering p wave ) no pacemaker    Patient Active Problem List   Diagnosis Date Noted  . Abdominal pain, left lower quadrant 10/27/2018  . Loose stools 10/27/2018  . Iron deficiency anemia due to chronic blood loss 04/14/2018  . Iron malabsorption 04/14/2018  . GIB (gastrointestinal bleeding) 04/14/2018  . Chronic anticoagulation (warfarin for Afib) 08/06/2017  . Gastric polyp   .  Fall 07/08/2017  . Scalp laceration 07/08/2017  . Hypokalemia 07/08/2017  . Recurrent diverticulitis s/p robotic sigmoid colectomy 08/06/2017 11/24/2016  . Right hip pain 05/14/2015  . Obesity (BMI 30-39.9) 01/10/2014  . Sleep apnea 01/10/2014  . Encounter for therapeutic drug monitoring 04/26/2013  . Essential hypertension 03/01/2013  . History of DVT (deep vein thrombosis) 01/12/2013  . Hyperglycemia 01/12/2013  . Dizziness 07/09/2010  . HYPERCHOLESTEROLEMIA 09/10/2009  . Carpal tunnel syndrome 09/10/2009  . Asthma 09/10/2009  . TMJ SYNDROME 09/10/2009  . GERD 09/10/2009  . GALLBLADDER DISEASE 09/10/2009    Past Surgical History:  Procedure Laterality Date  . ATRIAL ABLATION SURGERY  05/07/2016    "fib and flutter"  . ATRIAL FIBRILLATION ABLATION  2010   at [facility]  . ATRIAL FIBRILLATION ABLATION N/A 05/07/2016   Procedure: Atrial Fibrillation Ablation;  Surgeon: Thompson Grayer, MD;  Location: [facility];  Service: Cardiovascular;  Laterality: N/A;  . BREAST CYST ASPIRATION Bilateral   . CARDIAC CATHETERIZATION  2008  . CARPAL TUNNEL RELEASE Bilateral   . CESAREAN SECTION  1986; 1988  . COLONOSCOPY  X 2  . COLONOSCOPY W/ BIOPSIES AND POLYPECTOMY  X 2  . ESOPHAGOGASTRODUODENOSCOPY    . ESOPHAGOGASTRODUODENOSCOPY (EGD) WITH PROPOFOL N/A 07/19/2017   Procedure: ESOPHAGOGASTRODUODENOSCOPY (EGD) WITH PROPOFOL;  Surgeon: Yetta Flock, MD;  Location: [facility];  Service: Gastroenterology;  Laterality: N/A;  . INGUINAL HERNIA REPAIR Right   . LAPAROSCOPIC CHOLECYSTECTOMY  1989  . POLYPECTOMY N/A 07/19/2017   Procedure: POLYPECTOMY;  Surgeon: Yetta Flock, MD;  Location: [facility];  Service: Gastroenterology;  Laterality: N/A;  . PROCTOSCOPY N/A 08/06/2017   Procedure: PROCTOSCOPY;  Surgeon: Michael Boston, MD;  Location: [facility];  Service: General;  Laterality: N/A;  . TMJ ARTHROPLASTY    . TUBAL LIGATION  1988     OB History   No obstetric history on file.     Family History  Problem Relation Age of Onset  . Stroke Mother   . Heart attack Mother        2 previous MIs, 3 stents - CAD first diagnosed 54  . Thyroid disease Mother   . Colon polyps Father   . Lupus Sister   . Heart attack Paternal Grandfather        Died at 27 of massive MI  . Stroke Maternal Grandfather        Multiple family members  . Stroke Paternal Grandmother   . Thyroid disease Sister        x 2  . Colon cancer Neg Hx   . Stomach cancer Neg Hx     Social History   Tobacco Use  . Smoking status: Never Smoker  . Smokeless tobacco: Never Used  Vaping Use  . Vaping Use: Never used  Substance Use Topics  . Alcohol use: Not Currently    Comment: 07-13-17  . Drug use: No     Home Medications Prior to Admission medications   Medication Sig Start Date End Date Taking? Authorizing Provider  acetaminophen (TYLENOL) 650 MG CR tablet Take 1,300 mg by mouth every 8 (eight) hours as needed for pain.     [provider]  cetirizine (ZYRTEC) 10 MG tablet Take 10 mg by mouth daily.      [provider]  Cholecalciferol (VITAMIN D3) 1000 UNITS CAPS Take 2,000 Units by mouth daily.     [provider]  dicyclomine (BENTYL) 10 MG capsule TAKE 1 CAPSULE BY MOUTH  30 MINS BEFORE MEALS AND AT BEDTIME AS NEEDED FOR PAIN/CRAMPING 12/23/18   Zehr, Janett Billow D, PA-C  FLOVENT HFA 220 MCG/ACT inhaler Inhale 1 puff into the lungs 2 (two) times daily. 04/20/16   [provider]  fluticasone (FLONASE) 50 MCG/ACT nasal spray Place 1 spray into both nostrils 2 (two) times daily.  06/02/12   [provider]  losartan (COZAAR) 25 MG tablet Take 1 tablet (25 mg total) by mouth daily. 12/20/13   Allred, Jeneen Rinks, MD  meclizine (ANTIVERT) 25 MG tablet Take 1 tablet (25 mg total) by mouth 3 (three) times daily as needed for dizziness. 01/19/18   Dorie Rank, MD  Multiple Vitamin (MULTIVITAMIN WITH MINERALS) TABS tablet Take 1 tablet by mouth daily.    [provider]  omeprazole (PRILOSEC) 40 MG capsule Take 1 capsule (40 mg total) by mouth 2 (two) times daily. Must have an office visit for any further refills. Thank you 05/03/19   Armbruster, Carlota Raspberry, MD  oxyCODONE-acetaminophen (PERCOCET/ROXICET) 5-325 MG tablet Take 1-2 tablets by mouth every 4 (four) hours as needed for severe pain. 12/14/19   Volanda Napoleon, PA-C  PROAIR HFA 108 (90 BASE) MCG/ACT inhaler Inhale 2 puffs into the lungs every 6 (six) hours as needed for wheezing or shortness of breath.  03/22/12   [provider]  sucralfate (CARAFATE) 1 GM/10ML suspension Take 10 mLs (1 g total) by mouth every 6 (six) hours as needed. 06/15/17   Yetta Flock, MD  venlafaxine XR  (EFFEXOR-XR) 37.5 MG 24 hr capsule Take 37.5 mg by mouth daily with breakfast.  11/21/12   [provider]  verapamil (CALAN) 80 MG tablet Take 160 mg by mouth 2 (two) times daily.    [provider]  warfarin (COUMADIN) 2 MG tablet TAKE AS DIRECTED BY COUMADIN CLINIC 10/09/19   Allred, Jeneen Rinks, MD    Allergies    Aspirin, Banana, Food, Ivp dye [iodinated diagnostic agents], Latex, Meperidine hcl, Peanut-containing drug products, Penicillins, Statins, Sulfonamide derivatives, and Tape  Review of Systems   Review of Systems  Constitutional: Negative for fever.  Respiratory: Negative for shortness of breath.   Cardiovascular: Negative for chest pain.  Gastrointestinal: Positive for abdominal pain, nausea and vomiting.  All other systems reviewed and are negative.   Physical Exam Updated Vital Signs BP (!) 154/79   Pulse 71   Temp 98.3 F (36.8 C)   Resp 15   Ht 1.626 m (5\' 4" )   Wt 90.7 kg   SpO2 (!) 89%   BMI 34.32 kg/m   Physical Exam CONSTITUTIONAL: Well developed/well nourished HEAD: Normocephalic/atraumatic EYES: EOMI/PERRL ENMT: Mucous membranes moist NECK: supple no meningeal signs SPINE/BACK:entire spine nontender CV: S1/S2 noted, no murmurs/rubs/gallops noted LUNGS: Lungs are clear to auscultation bilaterally, no apparent distress ABDOMEN: soft, moderate LUQ tenderness, no rebound or guarding, bowel sounds noted throughout abdomen GU: Left cva tenderness NEURO: Pt is awake/alert/appropriate, moves all extremitiesx4.  No facial droop.   EXTREMITIES: Left foot in a boot SKIN: warm, color normal PSYCH: no abnormalities of mood noted, alert and oriented to situation  ED Results / Procedures / Treatments   Labs (all labs ordered are listed, but only abnormal results are displayed) Labs Reviewed  COMPREHENSIVE METABOLIC PANEL - Abnormal; Notable for the following components:      Result Value   Glucose, Bld 165 (*)    All other components within  normal limits  URINALYSIS, ROUTINE W REFLEX MICROSCOPIC - Abnormal; Notable for the following  components:   Hgb urine dipstick LARGE (*)    Non Squamous Epithelial 6-10 (*)    All other components within normal limits  LIPASE, BLOOD  CBC    EKG EKG Interpretation  Date/Time:  Thursday December 21 2019 03:11:27 EDT Ventricular Rate:  70 PR Interval:    QRS Duration: 98 QT Interval:  426 QTC Calculation: 460 R Axis:   29 Text Interpretation: Sinus rhythm Low voltage, precordial leads No significant change since last tracing Confirmed by Ripley Fraise ([id number]) on 12/21/2019 3:30:04 AM   Radiology CT Renal Stone Study  Result Date: 12/21/2019 CLINICAL DATA:  Left abdominal pain starting tonight. Pain radiates to the back. EXAM: CT ABDOMEN AND PELVIS WITHOUT CONTRAST TECHNIQUE: Multidetector CT imaging of the abdomen and pelvis was performed following the standard protocol without IV contrast. COMPARISON:  11/22/2018 FINDINGS: Lower chest: Peripheral fibrosis and emphysematous changes in the lung bases. Cardiac enlargement. Small esophageal hiatal hernia. Hepatobiliary: Surgical absence of the gallbladder. No bile duct dilatation. No focal liver lesions. Pancreas: Unremarkable. No pancreatic ductal dilatation or surrounding inflammatory changes. Spleen: Normal in size without focal abnormality. Adrenals/Urinary Tract: No adrenal gland nodules. Multiple intrarenal stones in the right kidney, largest in the midpole measuring 4 mm diameter. In the left kidney, there is a 6 mm stone in the ureteropelvic junction with proximal hydronephrosis and stranding around the left kidney. The distal left ureter is decompressed. The bladder is normal. Stomach/Bowel: Stomach is within normal limits. Appendix appears normal. No evidence of bowel wall thickening, distention, or inflammatory changes. Vascular/Lymphatic: No significant vascular findings are present. No enlarged abdominal or pelvic lymph nodes.  Reproductive: Uterus and bilateral adnexa are unremarkable. Other: No abdominal wall hernia or abnormality. No abdominopelvic ascites. Musculoskeletal: No acute or significant osseous findings. IMPRESSION: 1. 6 mm stone in the left ureteropelvic junction with moderate proximal obstruction. 2. Multiple nonobstructing stones in the right kidney. 3. Peripheral fibrosis and emphysematous changes in the lung bases. 4. Small esophageal hiatal hernia. Emphysema (ICD10-J43.9). Electronically Signed   By: Lucienne Capers M.D.   On: 12/21/2019 06:46    Procedures Procedures   Medications Ordered in ED Medications  fentaNYL (SUBLIMAZE) injection 50 mcg (50 mcg Intravenous Given 12/21/19 0303)  ondansetron (ZOFRAN) injection 4 mg (4 mg Intravenous Given 12/21/19 0303)  fentaNYL (SUBLIMAZE) injection 50 mcg (50 mcg Intravenous Given 12/21/19 0413)  fentaNYL (SUBLIMAZE) injection 50 mcg (50 mcg Intravenous Given 12/21/19 9678)    ED Course  I have reviewed the triage vital signs and the nursing notes.  Pertinent labs  results that were available during my care of the patient were reviewed by me and considered in my medical decision making (see chart for details).    MDM Rules/Calculators/A&P                          4:43 AM Patient presents with sudden onset of left upper quadrant and flank pain.  Patient was appropriate and in no distress on my exam.  Strong suspicion for ureteral stone.  Previous CT scan  revealed bilateral nephrolithiasis.  She has hematuria at this time. Will treat pain and reassess. 7:09 AM Patient had persistent pain and nausea.  CT imaging was performed which shows a 6 mm ureteral stone.  Patient is now improved.  No vomiting. She feels comfortable for discharge home. She already has pain control at home.  Will add on Zofran.  She has an allergy to sulfa, therefore not  be able to give Flomax We discussed return precautions Final Clinical Impression(s) / ED Diagnoses Final  diagnoses:  Ureteral stone with hydronephrosis    Rx / DC Orders ED Discharge Orders         Ordered    ondansetron (ZOFRAN ODT) 8 MG disintegrating tablet  Every 8 hours PRN        12/21/19 0707           Ripley Fraise, MD 12/21/19 0710

## 2019-12-21 NOTE — ED Triage Notes (Signed)
Pt c/o left abd pain that started suddenly tonight. Pt states the pain radiates around to her back. Pt currently on pain medications due to recent foot fracture.

## 2019-12-21 NOTE — ED Notes (Signed)
Pt given ice water for po challenge

## 2019-12-21 NOTE — Discharge Instructions (Addendum)
Due to your  sulfa allergy we are unable to give Flomax

## 2019-12-21 NOTE — ED Notes (Signed)
Assumed care of pt at 0700.  Pt resting.

## 2019-12-22 ENCOUNTER — Other Ambulatory Visit: Payer: Self-pay

## 2019-12-22 ENCOUNTER — Ambulatory Visit (HOSPITAL_COMMUNITY)
Admission: EM | Admit: 2019-12-22 | Discharge: 2019-12-23 | Disposition: A | Discharge status: Home / Self Care | Payer: Medicare Other | Attending: Emergency Medicine | Admitting: Emergency Medicine

## 2019-12-22 ENCOUNTER — Encounter (HOSPITAL_COMMUNITY): Payer: Self-pay | Admitting: Emergency Medicine

## 2019-12-22 DIAGNOSIS — Z7901 Long term (current) use of anticoagulants: Secondary | ICD-10-CM | POA: Insufficient documentation

## 2019-12-22 DIAGNOSIS — J439 Emphysema, unspecified: Secondary | ICD-10-CM | POA: Insufficient documentation

## 2019-12-22 DIAGNOSIS — E669 Obesity, unspecified: Secondary | ICD-10-CM | POA: Diagnosis not present

## 2019-12-22 DIAGNOSIS — N132 Hydronephrosis with renal and ureteral calculous obstruction: Secondary | ICD-10-CM | POA: Diagnosis not present

## 2019-12-22 DIAGNOSIS — K219 Gastro-esophageal reflux disease without esophagitis: Secondary | ICD-10-CM | POA: Insufficient documentation

## 2019-12-22 DIAGNOSIS — M1990 Unspecified osteoarthritis, unspecified site: Secondary | ICD-10-CM | POA: Insufficient documentation

## 2019-12-22 DIAGNOSIS — K449 Diaphragmatic hernia without obstruction or gangrene: Secondary | ICD-10-CM | POA: Diagnosis not present

## 2019-12-22 DIAGNOSIS — Z7951 Long term (current) use of inhaled steroids: Secondary | ICD-10-CM | POA: Insufficient documentation

## 2019-12-22 DIAGNOSIS — N200 Calculus of kidney: Secondary | ICD-10-CM

## 2019-12-22 DIAGNOSIS — Z79899 Other long term (current) drug therapy: Secondary | ICD-10-CM | POA: Insufficient documentation

## 2019-12-22 DIAGNOSIS — G4733 Obstructive sleep apnea (adult) (pediatric): Secondary | ICD-10-CM | POA: Insufficient documentation

## 2019-12-22 DIAGNOSIS — Z9049 Acquired absence of other specified parts of digestive tract: Secondary | ICD-10-CM | POA: Insufficient documentation

## 2019-12-22 DIAGNOSIS — N23 Unspecified renal colic: Secondary | ICD-10-CM | POA: Insufficient documentation

## 2019-12-22 DIAGNOSIS — R7303 Prediabetes: Secondary | ICD-10-CM | POA: Insufficient documentation

## 2019-12-22 DIAGNOSIS — Z86718 Personal history of other venous thrombosis and embolism: Secondary | ICD-10-CM | POA: Insufficient documentation

## 2019-12-22 DIAGNOSIS — Z20822 Contact with and (suspected) exposure to covid-19: Secondary | ICD-10-CM | POA: Insufficient documentation

## 2019-12-22 DIAGNOSIS — D500 Iron deficiency anemia secondary to blood loss (chronic): Secondary | ICD-10-CM | POA: Diagnosis not present

## 2019-12-22 DIAGNOSIS — E7800 Pure hypercholesterolemia, unspecified: Secondary | ICD-10-CM | POA: Diagnosis not present

## 2019-12-22 DIAGNOSIS — I480 Paroxysmal atrial fibrillation: Secondary | ICD-10-CM | POA: Diagnosis not present

## 2019-12-22 DIAGNOSIS — G5603 Carpal tunnel syndrome, bilateral upper limbs: Secondary | ICD-10-CM | POA: Diagnosis not present

## 2019-12-22 DIAGNOSIS — Z8249 Family history of ischemic heart disease and other diseases of the circulatory system: Secondary | ICD-10-CM | POA: Insufficient documentation

## 2019-12-22 DIAGNOSIS — Z6836 Body mass index (BMI) 36.0-36.9, adult: Secondary | ICD-10-CM | POA: Diagnosis not present

## 2019-12-22 DIAGNOSIS — Z950 Presence of cardiac pacemaker: Secondary | ICD-10-CM | POA: Insufficient documentation

## 2019-12-22 LAB — COMPREHENSIVE METABOLIC PANEL
ALT: 23 U/L (ref 0–44)
AST: 26 U/L (ref 15–41)
Albumin: 4 g/dL (ref 3.5–5.0)
Alkaline Phosphatase: 78 U/L (ref 38–126)
Anion gap: 10 (ref 5–15)
BUN: 12 mg/dL (ref 8–23)
CO2: 25 mmol/L (ref 22–32)
Calcium: 9.1 mg/dL (ref 8.9–10.3)
Chloride: 100 mmol/L (ref 98–111)
Creatinine, Ser: 0.78 mg/dL (ref 0.44–1.00)
GFR calc Af Amer: >60 mL/min (ref >60)
GFR calc non Af Amer: >60 mL/min (ref >60)
Glucose, Bld: 160 mg/dL — ABNORMAL HIGH (ref 70–99)
Potassium: 3.9 mmol/L (ref 3.5–5.1)
Sodium: 135 mmol/L (ref 135–145)
Total Bilirubin: 0.5 mg/dL (ref 0.3–1.2)
Total Protein: 7.4 g/dL (ref 6.5–8.1)

## 2019-12-22 LAB — CBC
HCT: 37.9 % (ref 36.0–46.0)
Hemoglobin: 12.5 g/dL (ref 12.0–15.0)
MCH: 30.6 pg (ref 26.0–34.0)
MCHC: 33 g/dL (ref 30.0–36.0)
MCV: 92.7 fL (ref 80.0–100.0)
Platelets: 354 10*3/uL (ref 150–400)
RBC: 4.09 MIL/uL (ref 3.87–5.11)
RDW: 12.7 % (ref 11.5–15.5)
WBC: 8.8 10*3/uL (ref 4.0–10.5)
nRBC: 0 % (ref 0.0–0.2)

## 2019-12-22 LAB — LIPASE, BLOOD: Lipase: 24 U/L (ref 11–51)

## 2019-12-22 MED ORDER — HYDROMORPHONE HCL 1 MG/ML IJ SOLN
1.0000 mg | Freq: Once | INTRAMUSCULAR | Status: AC
  Administered 2019-12-22: 1 mg via INTRAVENOUS
  Filled 2019-12-22: qty 1

## 2019-12-22 MED ORDER — SODIUM CHLORIDE 0.9 % IV BOLUS
500.0000 mL | Freq: Once | INTRAVENOUS | Status: AC
  Administered 2019-12-22: 500 mL via INTRAVENOUS

## 2019-12-22 MED ORDER — ONDANSETRON HCL 4 MG/2ML IJ SOLN
4.0000 mg | Freq: Once | INTRAMUSCULAR | Status: AC
  Administered 2019-12-22: 4 mg via INTRAVENOUS
  Filled 2019-12-22: qty 2

## 2019-12-22 NOTE — ED Notes (Signed)
Pt aware of need for urine sample.  

## 2019-12-22 NOTE — ED Provider Notes (Signed)
[facility] [place] DEPT Provider Note   CSN: [id number] Arrival date & time: 12/22/19  2116     History Chief Complaint  Patient presents with  . Flank Pain    [name] is a 64 y.o. female with a history of nephrolithiasis, paroxysmal afib, prior DVT, chronic anticoagulation on warfarin, hypertension, hypercholesterolemia, & OSA who returns to the ED with complaints of L flank pain that acutely worsened this evening. Patient reports she has had 3-4 days of L flank pain radiating into the LLQ which has been waxing/waning with associated nausea. Seen in the ED for sxs 12/21/19 & had a CT scan that revealed a 6 mm left UVJ stone with moderate proximal obstruction. She was discharged home with zofran, had prescription for hydrocodone related to recent L foot injury which she has been taking for pain. Saw urologist Dr. Claudia Desanctis today in clinic who informed patient that should her pain worsen she should come to the ED and if unable to obtain pain control may need stent procedure. This evening her pain worsened, not having relief with hydrocodone prompting ED visit. Current pain is >10/10. No alleviating/aggravating factors. Denies fever, chills, emesis, diarrhea, dysuria, or gross hematuria. No hx of prior kidney stones.    HPI     Past Medical History:  Diagnosis Date  . Allergic rhinitis   . Arthritis    "back" (05/07/2016)  . Asthma   . Atrial flutter ([place])    a. Remotely per notes.  . Carpal tunnel syndrome, bilateral   . Colon polyps   . Diverticulitis   . DVT (deep venous thrombosis) ([place]) 1980s x2 -    between birth of two sons. Saw hematology - was told she has a hypercoagulable disorder, should be on Coumadin lifelong.  Marland Kitchen Dysrhythmia    atrial fib  . Family history of adverse reaction to anesthesia    "mother gets PONV"  . Gallbladder disease   . GERD (gastroesophageal reflux disease)   . GIB (gastrointestinal bleeding) 04/14/2018  . Halothane  adverse reaction    narrow small opening  . Heart murmur   . Hiatal hernia   . History of hiatal hernia    "small one/CTA 05/01/2016" (05/07/2016)  . HTN (hypertension)   . Hypercholesteremia    hx; "brought it down w/diet" (05/07/2016)  . Hyperglycemia    a. A1c 6.1 in 2014.  . Inguinal hernia   . Iron deficiency anemia due to chronic blood loss 04/14/2018  . Iron malabsorption 04/14/2018  . Left sided sciatica   . Normal cardiac stress test 06/2012  . Obesity   . OSA (obstructive sleep apnea)    a. Mild, did not tolerate CPAP (05/07/2016)  . PAF (paroxysmal atrial fibrillation) ([place])    a. s/p afib ablation at [facility] 2010 (Dr. Annabell Howells). b. Recurrent AF s/p DCCV 06/2010. c. On flecainide.   . Paroxysmal atrial fibrillation ([place]) 09/09/2009   Qualifier: Diagnosis of  By: Selena Batten CMA, [place]    . PONV (postoperative nausea and vomiting)   . PPD positive, treated 1977   "treated for 1 yr after exposure to patient"  . Pre-diabetes   . RSV infection ~ 2006  . Wandering (atrial) pacemaker    a. Remotely per notes.  (  pt. states has a wandering p wave ) no pacemaker    Patient Active Problem List   Diagnosis Date Noted  . Abdominal pain, left lower quadrant 10/27/2018  . Loose stools 10/27/2018  . Iron deficiency  anemia due to chronic blood loss 04/14/2018  . Iron malabsorption 04/14/2018  . GIB (gastrointestinal bleeding) 04/14/2018  . Chronic anticoagulation (warfarin for Afib) 08/06/2017  . Gastric polyp   . Fall 07/08/2017  . Scalp laceration 07/08/2017  . Hypokalemia 07/08/2017  . Recurrent diverticulitis s/p robotic sigmoid colectomy 08/06/2017 11/24/2016  . Right hip pain 05/14/2015  . Obesity (BMI 30-39.9) 01/10/2014  . Sleep apnea 01/10/2014  . Encounter for therapeutic drug monitoring 04/26/2013  . Essential hypertension 03/01/2013  . History of DVT (deep vein thrombosis) 01/12/2013  . Hyperglycemia 01/12/2013  . Dizziness 07/09/2010  . HYPERCHOLESTEROLEMIA  09/10/2009  . Carpal tunnel syndrome 09/10/2009  . Asthma 09/10/2009  . TMJ SYNDROME 09/10/2009  . GERD 09/10/2009  . GALLBLADDER DISEASE 09/10/2009    Past Surgical History:  Procedure Laterality Date  . ATRIAL ABLATION SURGERY  05/07/2016   "fib and flutter"  . ATRIAL FIBRILLATION ABLATION  2010   at [facility]  . ATRIAL FIBRILLATION ABLATION N/A 05/07/2016   Procedure: Atrial Fibrillation Ablation;  Surgeon: Thompson Grayer, MD;  Location: [place] [facility];  Service: Cardiovascular;  Laterality: N/A;  . BREAST CYST ASPIRATION Bilateral   . CARDIAC CATHETERIZATION  2008  . CARPAL TUNNEL RELEASE Bilateral   . CESAREAN SECTION  1986; 1988  . COLONOSCOPY  X 2  . COLONOSCOPY W/ BIOPSIES AND POLYPECTOMY  X 2  . ESOPHAGOGASTRODUODENOSCOPY    . ESOPHAGOGASTRODUODENOSCOPY (EGD) WITH PROPOFOL N/A 07/19/2017   Procedure: ESOPHAGOGASTRODUODENOSCOPY (EGD) WITH PROPOFOL;  Surgeon: Yetta Flock, MD;  Location: [facility];  Service: Gastroenterology;  Laterality: N/A;  . INGUINAL HERNIA REPAIR Right   . LAPAROSCOPIC CHOLECYSTECTOMY  1989  . POLYPECTOMY N/A 07/19/2017   Procedure: POLYPECTOMY;  Surgeon: Yetta Flock, MD;  Location: [facility];  Service: Gastroenterology;  Laterality: N/A;  . PROCTOSCOPY N/A 08/06/2017   Procedure: PROCTOSCOPY;  Surgeon: Michael Boston, MD;  Location: [facility];  Service: General;  Laterality: N/A;  . TMJ ARTHROPLASTY    . TUBAL LIGATION  1988     OB History   No obstetric history on file.     Family History  Problem Relation Age of Onset  . Stroke Mother   . Heart attack Mother        2 previous MIs, 3 stents - CAD first diagnosed 74  . Thyroid disease Mother   . Colon polyps Father   . Lupus Sister   . Heart attack Paternal Grandfather        Died at 69 of massive MI  . Stroke Maternal Grandfather        Multiple family members  . Stroke Paternal Grandmother   . Thyroid disease Sister        x 2  . Colon cancer Neg Hx   .  Stomach cancer Neg Hx     Social History   Tobacco Use  . Smoking status: Never Smoker  . Smokeless tobacco: Never Used  Vaping Use  . Vaping Use: Never used  Substance Use Topics  . Alcohol use: Not Currently    Comment: 07-13-17  . Drug use: No    Home Medications Prior to Admission medications   Medication Sig Start Date End Date Taking? Authorizing Provider  acetaminophen (TYLENOL) 650 MG CR tablet Take 1,300 mg by mouth every 8 (eight) hours as needed for pain.     [provider]  cetirizine (ZYRTEC) 10 MG tablet Take 10 mg by mouth daily.      [provider]  Cholecalciferol (VITAMIN D3) 1000 UNITS CAPS Take 2,000 Units by mouth daily.     [provider]  dicyclomine (BENTYL) 10 MG capsule TAKE 1 CAPSULE BY MOUTH 30 MINS BEFORE MEALS AND AT BEDTIME AS NEEDED FOR PAIN/CRAMPING 12/23/18   Zehr, Janett Billow D, PA-C  FLOVENT HFA 220 MCG/ACT inhaler Inhale 1 puff into the lungs 2 (two) times daily. 04/20/16   [provider]  fluticasone (FLONASE) 50 MCG/ACT nasal spray Place 1 spray into both nostrils 2 (two) times daily.  06/02/12   [provider]  losartan (COZAAR) 25 MG tablet Take 1 tablet (25 mg total) by mouth daily. 12/20/13   Allred, Jeneen Rinks, MD  meclizine (ANTIVERT) 25 MG tablet Take 1 tablet (25 mg total) by mouth 3 (three) times daily as needed for dizziness. 01/19/18   Dorie Rank, MD  Multiple Vitamin (MULTIVITAMIN WITH MINERALS) TABS tablet Take 1 tablet by mouth daily.    [provider]  omeprazole (PRILOSEC) 40 MG capsule Take 1 capsule (40 mg total) by mouth 2 (two) times daily. Must have an office visit for any further refills. Thank you 05/03/19   Yetta Flock, MD  ondansetron (ZOFRAN ODT) 8 MG disintegrating tablet Take 1 tablet (8 mg total) by mouth every 8 (eight) hours as needed. 12/21/19   Ripley Fraise, MD  oxyCODONE-acetaminophen (PERCOCET/ROXICET) 5-325 MG tablet Take 1-2 tablets by mouth every 4 (four)  hours as needed for severe pain. 12/14/19   Volanda Napoleon, PA-C  PROAIR HFA 108 (90 BASE) MCG/ACT inhaler Inhale 2 puffs into the lungs every 6 (six) hours as needed for wheezing or shortness of breath.  03/22/12   [provider]  sucralfate (CARAFATE) 1 GM/10ML suspension Take 10 mLs (1 g total) by mouth every 6 (six) hours as needed. 06/15/17   Yetta Flock, MD  venlafaxine XR (EFFEXOR-XR) 37.5 MG 24 hr capsule Take 37.5 mg by mouth daily with breakfast.  11/21/12   [provider]  verapamil (CALAN) 80 MG tablet Take 160 mg by mouth 2 (two) times daily.    [provider]  warfarin (COUMADIN) 2 MG tablet TAKE AS DIRECTED BY COUMADIN CLINIC 10/09/19   Allred, Jeneen Rinks, MD    Allergies    Aspirin, Banana, Food, Ivp dye [iodinated diagnostic agents], Latex, Meperidine hcl, Peanut-containing drug products, Penicillins, Statins, Sulfonamide derivatives, and Tape  Review of Systems   Review of Systems  Constitutional: Negative for chills and fever.  Respiratory: Negative for shortness of breath.   Cardiovascular: Negative for chest pain.  Gastrointestinal: Positive for abdominal distention and nausea. Negative for blood in stool, diarrhea and vomiting.  Genitourinary: Positive for flank pain. Negative for dysuria and hematuria (gross).  Neurological: Negative for syncope.  All other systems reviewed and are negative.   Physical Exam Updated Vital Signs BP (!) 152/91 (BP Location: Left Arm)   Pulse 76   Temp 98.2 F (36.8 C) (Oral)   Resp 18   Ht 5\' 4"  (1.626 m)   Wt 97 kg   SpO2 95%   BMI 36.70 kg/m   Physical Exam Vitals and nursing note reviewed.  Constitutional:      Appearance: She is well-developed. She is not toxic-appearing.  HENT:     Head: Normocephalic and atraumatic.  Eyes:     General:        Right eye: No discharge.        Left eye: No discharge.     Conjunctiva/sclera: Conjunctivae normal.  Cardiovascular:     Rate and Rhythm:  Normal rate and regular rhythm.  Pulmonary:     Effort: Pulmonary effort is normal. No respiratory distress.     Breath sounds: Normal breath sounds. No wheezing, rhonchi or rales.  Abdominal:     General: There is no distension.     Palpations: Abdomen is soft.     Tenderness: There is abdominal tenderness (LLQ). There is left CVA tenderness. There is no right CVA tenderness, guarding or rebound.  Musculoskeletal:     Cervical back: Neck supple.     Comments: LLE if short boot.   Skin:    General: Skin is warm and dry.     Findings: No rash.  Neurological:     Mental Status: She is alert.     Comments: Clear speech.   Psychiatric:        Behavior: Behavior normal.     ED Results / Procedures / Treatments   Labs (all labs ordered are listed, but only abnormal results are displayed) Labs Reviewed  COMPREHENSIVE METABOLIC PANEL - Abnormal; Notable for the following components:      Result Value   Glucose, Bld 160 (*)    All other components within normal limits  LIPASE, BLOOD  CBC  URINALYSIS, ROUTINE W REFLEX MICROSCOPIC    EKG None  Radiology CT Renal Stone Study  Result Date: 12/21/2019 CLINICAL DATA:  Left abdominal pain starting tonight. Pain radiates to the back. EXAM: CT ABDOMEN AND PELVIS WITHOUT CONTRAST TECHNIQUE: Multidetector CT imaging of the abdomen and pelvis was performed following the standard protocol without IV contrast. COMPARISON:  11/22/2018 FINDINGS: Lower chest: Peripheral fibrosis and emphysematous changes in the lung bases. Cardiac enlargement. Small esophageal hiatal hernia. Hepatobiliary: Surgical absence of the gallbladder. No bile duct dilatation. No focal liver lesions. Pancreas: Unremarkable. No pancreatic ductal dilatation or surrounding inflammatory changes. Spleen: Normal in size without focal abnormality. Adrenals/Urinary Tract: No adrenal gland nodules. Multiple intrarenal stones in the right kidney, largest in the midpole measuring 4 mm  diameter. In the left kidney, there is a 6 mm stone in the ureteropelvic junction with proximal hydronephrosis and stranding around the left kidney. The distal left ureter is decompressed. The bladder is normal. Stomach/Bowel: Stomach is within normal limits. Appendix appears normal. No evidence of bowel wall thickening, distention, or inflammatory changes. Vascular/Lymphatic: No significant vascular findings are present. No enlarged abdominal or pelvic lymph nodes. Reproductive: Uterus and bilateral adnexa are unremarkable. Other: No abdominal wall hernia or abnormality. No abdominopelvic ascites. Musculoskeletal: No acute or significant osseous findings. IMPRESSION: 1. 6 mm stone in the left ureteropelvic junction with moderate proximal obstruction. 2. Multiple nonobstructing stones in the right kidney. 3. Peripheral fibrosis and emphysematous changes in the lung bases. 4. Small esophageal hiatal hernia. Emphysema (ICD10-J43.9). Electronically Signed   By: Lucienne Capers M.D.   On: 12/21/2019 06:46    Procedures Procedures (including critical care time)  Medications Ordered in ED Medications  HYDROmorphone (DILAUDID) injection 1 mg (1 mg Intravenous Given 12/23/19 0332)  metoCLOPramide (REGLAN) injection 5 mg (has no administration in time range)  HYDROmorphone (DILAUDID) injection 1 mg (1 mg Intravenous Given 12/22/19 2333)  ondansetron (ZOFRAN) injection 4 mg (4 mg Intravenous Given 12/22/19 2333)  sodium chloride 0.9 % bolus 500 mL (500 mLs Intravenous New Bag/Given 12/22/19 2335)  fentaNYL (SUBLIMAZE) injection 50 mcg (50 mcg Intravenous Given 12/23/19 0028)  HYDROmorphone (DILAUDID) injection 1 mg (1 mg Intravenous Given 12/23/19 0132)  promethazine (PHENERGAN) injection  6.25 mg (6.25 mg Intravenous Given 12/23/19 0153)  sodium chloride 0.9 % bolus 500 mL (500 mLs Intravenous New Bag/Given (Non-Interop) 12/23/19 0222)    ED Course  I have reviewed the triage vital signs and the nursing  notes.  Pertinent labs & imaging results that were available during my care of the patient were reviewed by me and considered in my medical decision making (see chart for details).    MDM Rules/Calculators/A&P                         Patient presents to the ED with complaints of worsening left flank pain with known 6 mm ureteral stone.  Nontoxic, vitals with elevated blood pressure.  Some left lower quadrant and left CVA tenderness to palpation on exam.  Plan for labs and symptomatic care with reassessment.  Fluids ordered for hydration, Dilaudid ordered for pain, and Zofran ordered for nausea.  Additional history obtained:  Additional history obtained from chart review and nursing note reviewed.. Previous records obtained and reviewed CT and labs from prior visit.  Lab Tests:  I reviewed and interpreted labs, which included: CBC, CMP, and lipase, mild hyperglycemia, otherwise unremarkable.  Urinalysis pending at this time.  00:18: RE-EVAL: Status post Dilaudid patient with 8 out of 10 in severity pain.  Will trial fentanyl as this is what she had at her prior visit.  SPO2 is 88% on room air status post Dilaudid, 2 L via nasal cannula applied, she states this happened with her last ER visit after narcotics as well.  00:58: RE-EVAL: No change status post fentanyl, she feels that the Dilaudid helped more, will redose this and reassess.  Per RN complaining of nausea, phenergan ordered.   01:58: RE-EVAL: Patient still uncomfortable, will discuss w/ urology.   02:00: RE-EVAL: Discussed with urologist Dr. Claudia Desanctis, will check OR schedule. --> Re-discussed w/ Dr. Claudia Desanctis plan for OR for stent procedure first thing this AM. Appreciate consultation.   PRN analgesics ordered pending urology assessment & OR.   UA w/ moderate leukocytes, rare bacteria, nitrite negative, no dysuria, fever, or leukocytosis to raise concern for infection, culture sent.    Patient care transitioned to oncoming ED staff  pending urology intervention.   Findings and plan of care discussed with supervising physician Dr. Regenia Skeeter who has evaluated patient & is in agreement.   Portions of this note were generated with [employer]. Dictation errors may occur despite best attempts at proofreading.  Final Clinical Impression(s) / ED Diagnoses Final diagnoses:  Ureteral colic  Kidney stone    Rx / DC Orders ED Discharge Orders    None       Amaryllis Dyke, PA-C 12/23/19 2376    Sherwood Gambler, MD 12/23/19 2303

## 2019-12-22 NOTE — ED Triage Notes (Signed)
Patient c/o left flank pain x2 days. Seen at ED x2 days ago. Saw urology this morning and told to come to ED for possible stent placement if pain continues.

## 2019-12-23 ENCOUNTER — Encounter (HOSPITAL_COMMUNITY): Payer: Self-pay | Admitting: Anesthesiology

## 2019-12-23 ENCOUNTER — Encounter (HOSPITAL_COMMUNITY)
Admission: EM | Disposition: A | Discharge status: Home / Self Care | Payer: Self-pay | Source: Home / Self Care | Attending: Emergency Medicine

## 2019-12-23 ENCOUNTER — Emergency Department (HOSPITAL_COMMUNITY): Payer: Medicare Other | Admitting: Anesthesiology

## 2019-12-23 ENCOUNTER — Emergency Department (HOSPITAL_COMMUNITY): Payer: Medicare Other

## 2019-12-23 DIAGNOSIS — N23 Unspecified renal colic: Secondary | ICD-10-CM | POA: Diagnosis not present

## 2019-12-23 DIAGNOSIS — Z20822 Contact with and (suspected) exposure to covid-19: Secondary | ICD-10-CM | POA: Diagnosis not present

## 2019-12-23 DIAGNOSIS — N132 Hydronephrosis with renal and ureteral calculous obstruction: Secondary | ICD-10-CM | POA: Diagnosis not present

## 2019-12-23 DIAGNOSIS — D500 Iron deficiency anemia secondary to blood loss (chronic): Secondary | ICD-10-CM | POA: Diagnosis not present

## 2019-12-23 HISTORY — PX: CYSTOSCOPY/URETEROSCOPY/HOLMIUM LASER/STENT PLACEMENT: SHX6546

## 2019-12-23 LAB — URINALYSIS, ROUTINE W REFLEX MICROSCOPIC
Bilirubin Urine: NEGATIVE
Glucose, UA: NEGATIVE mg/dL
Ketones, ur: NEGATIVE mg/dL
Nitrite: NEGATIVE
Protein, ur: NEGATIVE mg/dL
Specific Gravity, Urine: 1.012 (ref 1.005–1.030)
pH: 5 (ref 5.0–8.0)

## 2019-12-23 LAB — PROTIME-INR
INR: 2.7 — ABNORMAL HIGH (ref 0.8–1.2)
Prothrombin Time: 27.4 seconds — ABNORMAL HIGH (ref 11.4–15.2)

## 2019-12-23 LAB — RESPIRATORY PANEL BY RT PCR (FLU A&B, COVID)
Influenza A by PCR: NEGATIVE
Influenza B by PCR: NEGATIVE
SARS Coronavirus 2 by RT PCR: NEGATIVE

## 2019-12-23 SURGERY — CYSTOSCOPY/URETEROSCOPY/HOLMIUM LASER/STENT PLACEMENT
Anesthesia: General | Laterality: Left

## 2019-12-23 MED ORDER — LIDOCAINE 2% (20 MG/ML) 5 ML SYRINGE
INTRAMUSCULAR | Status: DC | PRN
  Administered 2019-12-23: 60 mg via INTRAVENOUS

## 2019-12-23 MED ORDER — METOCLOPRAMIDE HCL 5 MG/ML IJ SOLN
5.0000 mg | Freq: Once | INTRAMUSCULAR | Status: AC
  Administered 2019-12-23: 5 mg via INTRAVENOUS
  Filled 2019-12-23: qty 2

## 2019-12-23 MED ORDER — FENTANYL CITRATE (PF) 100 MCG/2ML IJ SOLN
INTRAMUSCULAR | Status: AC
  Filled 2019-12-23: qty 2

## 2019-12-23 MED ORDER — SODIUM CHLORIDE 0.9 % IV BOLUS
500.0000 mL | Freq: Once | INTRAVENOUS | Status: AC
  Administered 2019-12-23: 500 mL via INTRAVENOUS

## 2019-12-23 MED ORDER — PROPOFOL 10 MG/ML IV BOLUS
INTRAVENOUS | Status: AC
  Filled 2019-12-23: qty 20

## 2019-12-23 MED ORDER — OXYCODONE HCL 5 MG/5ML PO SOLN: 5.0000 mg | Freq: Once | ORAL | Status: DC | PRN

## 2019-12-23 MED ORDER — ONDANSETRON HCL 4 MG/2ML IJ SOLN
INTRAMUSCULAR | Status: AC
  Filled 2019-12-23: qty 2

## 2019-12-23 MED ORDER — FENTANYL CITRATE (PF) 100 MCG/2ML IJ SOLN
INTRAMUSCULAR | Status: DC | PRN
Start: 2019-12-23 — End: 2019-12-23
  Administered 2019-12-23: 50 ug via INTRAVENOUS

## 2019-12-23 MED ORDER — CIPROFLOXACIN IN D5W 400 MG/200ML IV SOLN
INTRAVENOUS | Status: AC
  Filled 2019-12-23: qty 200

## 2019-12-23 MED ORDER — MIDAZOLAM HCL 2 MG/2ML IJ SOLN
INTRAMUSCULAR | Status: AC
  Filled 2019-12-23: qty 2

## 2019-12-23 MED ORDER — DEXAMETHASONE SODIUM PHOSPHATE 10 MG/ML IJ SOLN
INTRAMUSCULAR | Status: AC
  Filled 2019-12-23: qty 1

## 2019-12-23 MED ORDER — CIPROFLOXACIN IN D5W 400 MG/200ML IV SOLN
400.0000 mg | Freq: Once | INTRAVENOUS | Status: AC
  Administered 2019-12-23: 400 mg via INTRAVENOUS

## 2019-12-23 MED ORDER — MIDAZOLAM HCL 5 MG/5ML IJ SOLN
INTRAMUSCULAR | Status: DC | PRN
  Administered 2019-12-23: 1 mg via INTRAVENOUS

## 2019-12-23 MED ORDER — HYDROMORPHONE HCL 1 MG/ML IJ SOLN
1.0000 mg | INTRAMUSCULAR | Status: AC | PRN
  Administered 2019-12-23 (×3): 1 mg via INTRAVENOUS
  Filled 2019-12-23 (×3): qty 1

## 2019-12-23 MED ORDER — ONDANSETRON HCL 4 MG/2ML IJ SOLN: 4.0000 mg | Freq: Four times a day (QID) | INTRAMUSCULAR | Status: DC | PRN

## 2019-12-23 MED ORDER — FENTANYL CITRATE (PF) 100 MCG/2ML IJ SOLN
50.0000 ug | Freq: Once | INTRAMUSCULAR | Status: AC
  Administered 2019-12-23: 50 ug via INTRAVENOUS
  Filled 2019-12-23: qty 2

## 2019-12-23 MED ORDER — FENTANYL CITRATE (PF) 100 MCG/2ML IJ SOLN: 25.0000 ug | INTRAMUSCULAR | Status: DC | PRN

## 2019-12-23 MED ORDER — SODIUM CHLORIDE 0.9 % IR SOLN
Status: DC | PRN
  Administered 2019-12-23: 1000 mL

## 2019-12-23 MED ORDER — CIPROFLOXACIN HCL 500 MG PO TABS: 500.0000 mg | ORAL_TABLET | Freq: Once | ORAL | 0 refills | Status: AC

## 2019-12-23 MED ORDER — SODIUM CHLORIDE 0.9 % IR SOLN
Status: DC | PRN
  Administered 2019-12-23: 3000 mL

## 2019-12-23 MED ORDER — ONDANSETRON HCL 4 MG/2ML IJ SOLN
INTRAMUSCULAR | Status: DC | PRN
  Administered 2019-12-23: 4 mg via INTRAVENOUS

## 2019-12-23 MED ORDER — OXYCODONE HCL 5 MG PO TABS: 5.0000 mg | ORAL_TABLET | Freq: Once | ORAL | Status: DC | PRN

## 2019-12-23 MED ORDER — PROPOFOL 10 MG/ML IV BOLUS
INTRAVENOUS | Status: DC | PRN
  Administered 2019-12-23: 150 mg via INTRAVENOUS

## 2019-12-23 MED ORDER — DEXAMETHASONE SODIUM PHOSPHATE 10 MG/ML IJ SOLN
INTRAMUSCULAR | Status: DC | PRN
  Administered 2019-12-23: 10 mg via INTRAVENOUS

## 2019-12-23 MED ORDER — HYDROMORPHONE HCL 1 MG/ML IJ SOLN
1.0000 mg | Freq: Once | INTRAMUSCULAR | Status: AC
  Administered 2019-12-23: 1 mg via INTRAVENOUS
  Filled 2019-12-23: qty 1

## 2019-12-23 MED ORDER — PROMETHAZINE HCL 25 MG/ML IJ SOLN
6.2500 mg | Freq: Once | INTRAMUSCULAR | Status: AC
  Administered 2019-12-23: 6.25 mg via INTRAVENOUS
  Filled 2019-12-23: qty 1

## 2019-12-23 MED ORDER — LIDOCAINE 2% (20 MG/ML) 5 ML SYRINGE
INTRAMUSCULAR | Status: AC
  Filled 2019-12-23: qty 5

## 2019-12-23 MED ORDER — IOHEXOL 300 MG/ML  SOLN
INTRAMUSCULAR | Status: DC | PRN
  Administered 2019-12-23: 10 mL

## 2019-12-23 MED ORDER — LACTATED RINGERS IV SOLN: INTRAVENOUS | Status: DC | PRN

## 2019-12-23 SURGICAL SUPPLY — 17 items
BAG URO CATCHER STRL LF (MISCELLANEOUS) ×2 IMPLANT
BASKET ZERO TIP NITINOL 2.4FR (BASKET) ×2 IMPLANT
CATH URET 5FR 28IN OPEN ENDED (CATHETERS) ×2 IMPLANT
CLOTH BEACON ORANGE TIMEOUT ST (SAFETY) ×2 IMPLANT
DRSG TEGADERM 2-3/8X2-3/4 SM (GAUZE/BANDAGES/DRESSINGS) ×2 IMPLANT
EXTRACTOR STONE 1.7FRX115CM (UROLOGICAL SUPPLIES) IMPLANT
GLOVE BIO SURGEON STRL SZ 6.5 (GLOVE) ×2 IMPLANT
GOWN STRL REUS W/TWL LRG LVL3 (GOWN DISPOSABLE) ×2 IMPLANT
GUIDEWIRE STR DUAL SENSOR (WIRE) ×4 IMPLANT
KIT TURNOVER KIT A (KITS) IMPLANT
MANIFOLD NEPTUNE II (INSTRUMENTS) ×2 IMPLANT
PACK CYSTO (CUSTOM PROCEDURE TRAY) ×2 IMPLANT
SHEATH URETERAL 12FRX28CM (UROLOGICAL SUPPLIES) ×2 IMPLANT
SHEATH URETERAL 12FRX35CM (MISCELLANEOUS) IMPLANT
STENT URET 6FRX24 CONTOUR (STENTS) ×2 IMPLANT
TUBING CONNECTING 10 (TUBING) ×2 IMPLANT
TUBING UROLOGY SET (TUBING) ×2 IMPLANT

## 2019-12-23 NOTE — H&P (View-Only) (Signed)
I have been asked to see the patient by Dr. Charlesetta Shanks, for evaluation and management of left ureteral calculus.   History of present illness: Patient history below seen yesterday in the office.  She returns to ED with intractable pain.   64 year old woman presents to the ER yesterday with severe left flank pain and emesis found to have a 6 mm left UPJ calculus with moderate hydronephrosis. Patient also has multiple nonobstructing renal calculi. This is patient's 1st stone episode. Her nausea resolved with 1 dose of Zofran in the emergency room. She has been able to keep food and water down and pain is controlled with oral pain medication. Patient broke her left foot and is taking schedule pain medication which is helping the kidney stone pain. Patient has a longstanding history of urinary urgency since her 55s. She was told she has a short urethra as the cause. Patient is retired [employer] scientist. Urinalysis without concerning findings for infection.    Review of systems: A 12 point comprehensive review of systems was obtained and is negative unless otherwise stated in the history of present illness.  Patient Active Problem List   Diagnosis Date Noted  . Abdominal pain, left lower quadrant 10/27/2018  . Loose stools 10/27/2018  . Iron deficiency anemia due to chronic blood loss 04/14/2018  . Iron malabsorption 04/14/2018  . GIB (gastrointestinal bleeding) 04/14/2018  . Chronic anticoagulation (warfarin for Afib) 08/06/2017  . Gastric polyp   . Fall 07/08/2017  . Scalp laceration 07/08/2017  . Hypokalemia 07/08/2017  . Recurrent diverticulitis s/p robotic sigmoid colectomy 08/06/2017 11/24/2016  . Right hip pain 05/14/2015  . Obesity (BMI 30-39.9) 01/10/2014  . Sleep apnea 01/10/2014  . Encounter for therapeutic drug monitoring 04/26/2013  . Essential hypertension 03/01/2013  . History of DVT (deep vein thrombosis) 01/12/2013  . Hyperglycemia 01/12/2013  . Dizziness 07/09/2010  .  HYPERCHOLESTEROLEMIA 09/10/2009  . Carpal tunnel syndrome 09/10/2009  . Asthma 09/10/2009  . TMJ SYNDROME 09/10/2009  . GERD 09/10/2009  . GALLBLADDER DISEASE 09/10/2009    No current facility-administered medications on file prior to encounter.   Current Outpatient Medications on File Prior to Encounter  Medication Sig Dispense Refill  . acetaminophen (TYLENOL) 650 MG CR tablet Take 1,300 mg by mouth every 8 (eight) hours as needed for pain.     . cetirizine (ZYRTEC) 10 MG tablet Take 10 mg by mouth daily.      . Cholecalciferol (VITAMIN D3) 1000 UNITS CAPS Take 2,000 Units by mouth daily.     Marland Kitchen dicyclomine (BENTYL) 10 MG capsule TAKE 1 CAPSULE BY MOUTH 30 MINS BEFORE MEALS AND AT BEDTIME AS NEEDED FOR PAIN/CRAMPING 360 capsule 1  . FLOVENT HFA 220 MCG/ACT inhaler Inhale 1 puff into the lungs 2 (two) times daily.  1  . fluticasone (FLONASE) 50 MCG/ACT nasal spray Place 1 spray into both nostrils 2 (two) times daily.     Marland Kitchen losartan (COZAAR) 25 MG tablet Take 1 tablet (25 mg total) by mouth daily. 90 tablet 0  . meclizine (ANTIVERT) 25 MG tablet Take 1 tablet (25 mg total) by mouth 3 (three) times daily as needed for dizziness. 30 tablet 0  . Multiple Vitamin (MULTIVITAMIN WITH MINERALS) TABS tablet Take 1 tablet by mouth daily.    Marland Kitchen omeprazole (PRILOSEC) 40 MG capsule Take 1 capsule (40 mg total) by mouth 2 (two) times daily. Must have an office visit for any further refills. Thank you 180 capsule 0  . ondansetron (ZOFRAN ODT) 8 MG  disintegrating tablet Take 1 tablet (8 mg total) by mouth every 8 (eight) hours as needed. 8 tablet 0  . oxyCODONE-acetaminophen (PERCOCET/ROXICET) 5-325 MG tablet Take 1-2 tablets by mouth every 4 (four) hours as needed for severe pain. 6 tablet 0  . PROAIR HFA 108 (90 BASE) MCG/ACT inhaler Inhale 2 puffs into the lungs every 6 (six) hours as needed for wheezing or shortness of breath.     . sucralfate (CARAFATE) 1 GM/10ML suspension Take 10 mLs (1 g total) by  mouth every 6 (six) hours as needed. 420 mL 1  . venlafaxine XR (EFFEXOR-XR) 37.5 MG 24 hr capsule Take 37.5 mg by mouth daily with breakfast.     . verapamil (CALAN) 80 MG tablet Take 160 mg by mouth 2 (two) times daily.    Marland Kitchen warfarin (COUMADIN) 2 MG tablet TAKE AS DIRECTED BY COUMADIN CLINIC 225 tablet 1    Past Medical History:  Diagnosis Date  . Allergic rhinitis   . Arthritis    "back" (05/07/2016)  . Asthma   . Atrial flutter ([place])    a. Remotely per notes.  . Carpal tunnel syndrome, bilateral   . Colon polyps   . Diverticulitis   . DVT (deep venous thrombosis) ([place]) 1980s x2 -    between birth of two sons. Saw hematology - was told she has a hypercoagulable disorder, should be on Coumadin lifelong.  Marland Kitchen Dysrhythmia    atrial fib  . Family history of adverse reaction to anesthesia    "mother gets PONV"  . Gallbladder disease   . GERD (gastroesophageal reflux disease)   . GIB (gastrointestinal bleeding) 04/14/2018  . Halothane adverse reaction    narrow small opening  . Heart murmur   . Hiatal hernia   . History of hiatal hernia    "small one/CTA 05/01/2016" (05/07/2016)  . HTN (hypertension)   . Hypercholesteremia    hx; "brought it down w/diet" (05/07/2016)  . Hyperglycemia    a. A1c 6.1 in 2014.  . Inguinal hernia   . Iron deficiency anemia due to chronic blood loss 04/14/2018  . Iron malabsorption 04/14/2018  . Left sided sciatica   . Normal cardiac stress test 06/2012  . Obesity   . OSA (obstructive sleep apnea)    a. Mild, did not tolerate CPAP (05/07/2016)  . PAF (paroxysmal atrial fibrillation) ([place])    a. s/p afib ablation [facility] (Dr. Annabell Howells). b. Recurrent AF s/p DCCV 06/2010. c. On flecainide.   . Paroxysmal atrial fibrillation ([place]) 09/09/2009   Qualifier: Diagnosis of  By: Selena Batten CMA, [place]    . PONV (postoperative nausea and vomiting)   . PPD positive, treated 1977   "treated for 1 yr after exposure to patient"  . Pre-diabetes   . RSV infection  ~ 2006  . Wandering (atrial) pacemaker    a. Remotely per notes.  (  pt. states has a wandering p wave ) no pacemaker    Past Surgical History:  Procedure Laterality Date  . ATRIAL ABLATION SURGERY  05/07/2016   "fib and flutter"  . ATRIAL FIBRILLATION ABLATION  2010   at [facility]  . ATRIAL FIBRILLATION ABLATION N/A 05/07/2016   Procedure: Atrial Fibrillation Ablation;  Surgeon: Thompson Grayer, MD;  Location: [facility];  Service: Cardiovascular;  Laterality: N/A;  . BREAST CYST ASPIRATION Bilateral   . CARDIAC CATHETERIZATION  2008  . CARPAL TUNNEL RELEASE Bilateral   . CESAREAN SECTION  1986; 1988  . COLONOSCOPY  X 2  . COLONOSCOPY W/ BIOPSIES AND POLYPECTOMY  X 2  . ESOPHAGOGASTRODUODENOSCOPY    . ESOPHAGOGASTRODUODENOSCOPY (EGD) WITH PROPOFOL N/A 07/19/2017   Procedure: ESOPHAGOGASTRODUODENOSCOPY (EGD) WITH PROPOFOL;  Surgeon: Yetta Flock, MD;  Location: [facility];  Service: Gastroenterology;  Laterality: N/A;  . INGUINAL HERNIA REPAIR Right   . LAPAROSCOPIC CHOLECYSTECTOMY  1989  . POLYPECTOMY N/A 07/19/2017   Procedure: POLYPECTOMY;  Surgeon: Yetta Flock, MD;  Location: [facility];  Service: Gastroenterology;  Laterality: N/A;  . PROCTOSCOPY N/A 08/06/2017   Procedure: PROCTOSCOPY;  Surgeon: Michael Boston, MD;  Location: [facility];  Service: General;  Laterality: N/A;  . TMJ ARTHROPLASTY    . TUBAL LIGATION  1988    Social History   Tobacco Use  . Smoking status: Never Smoker  . Smokeless tobacco: Never Used  Vaping Use  . Vaping Use: Never used  Substance Use Topics  . Alcohol use: Not Currently    Comment: 07-13-17  . Drug use: No    Family History  Problem Relation Age of Onset  . Stroke Mother   . Heart attack Mother        2 previous MIs, 3 stents - CAD first diagnosed 72  . Thyroid disease Mother   . Colon polyps Father   . Lupus Sister   . Heart attack Paternal Grandfather        Died at 52 of massive MI  . Stroke Maternal  Grandfather        Multiple family members  . Stroke Paternal Grandmother   . Thyroid disease Sister        x 2  . Colon cancer Neg Hx   . Stomach cancer Neg Hx     PE: Vitals:   12/23/19 0308 12/23/19 0500 12/23/19 0530 12/23/19 0600  BP: (!) 185/91 (!) 189/92 (!) 194/98 (!) 192/88  Pulse: 78 89 85 88  Resp: 15 16  16   Temp:      TempSrc:      SpO2: 91% 94% 95% 98%  Weight:      Height:       Patient appears to be in no acute distress  patient is alert and oriented x3 Atraumatic normocephalic head No cervical or supraclavicular lymphadenopathy appreciated No increased work of breathing, no audible wheezes/rhonchi Regular sinus rhythm/rate Abdomen is soft, nondistended Lower extremities are symmetric without appreciable edema Grossly neurologically intact No identifiable skin lesions  Recent Labs    12/21/19 0242 12/22/19 2252  WBC 8.3 8.8  HGB 12.0 12.5  HCT 36.2 37.9   Recent Labs    12/21/19 0242 12/22/19 2252  NA 139 135  K 3.9 3.9  CL 102 100  CO2 28 25  GLUCOSE 165* 160*  BUN 13 12  CREATININE 0.59 0.78  CALCIUM 8.9 9.1   Recent Labs    12/23/19 0158  INR 2.7*   No results for input(s): LABURIN in the last 72 hours. Results for orders placed or performed during the hospital encounter of 12/22/19  Respiratory Panel by RT PCR (Flu A&B, Covid) - Nasopharyngeal Swab     Status: None   Collection Time: 12/23/19  2:18 AM   Specimen: Nasopharyngeal Swab  Result Value Ref Range Status   SARS Coronavirus 2 by RT PCR NEGATIVE NEGATIVE Final    Comment: (NOTE) SARS-CoV-2 target nucleic acids are NOT DETECTED.  The SARS-CoV-2 RNA is generally detectable in upper respiratoy specimens during the acute phase of infection. The lowest concentration of  SARS-CoV-2 viral copies this assay can detect is 131 copies/mL. A negative result does not preclude SARS-Cov-2 infection and should not be used as the sole basis for treatment or other patient management  decisions. A negative result may occur with  improper specimen collection/handling, submission of specimen other than nasopharyngeal swab, presence of viral mutation(s) within the areas targeted by this assay, and inadequate number of viral copies (<131 copies/mL). A negative result must be combined with clinical observations, patient history, and epidemiological information. The expected result is Negative.  Fact Sheet for Patients:  PinkCheek.be  Fact Sheet for Healthcare Providers:  GravelBags.it  This test is no t yet approved or cleared by the [place] FDA and  has been authorized for detection and/or diagnosis of SARS-CoV-2 by FDA under an Emergency Use Authorization (EUA). This EUA will remain  in effect (meaning this test can be used) for the duration of the COVID-19 declaration under Section 564(b)(1) of the Act, 21 U.S.C. section 360bbb-3(b)(1), unless the authorization is terminated or revoked sooner.     Influenza A by PCR NEGATIVE NEGATIVE Final   Influenza B by PCR NEGATIVE NEGATIVE Final    Comment: (NOTE) The Xpert Xpress SARS-CoV-2/FLU/RSV assay is intended as an aid in  the diagnosis of influenza from Nasopharyngeal swab specimens and  should not be used as a sole basis for treatment. Nasal washings and  aspirates are unacceptable for Xpert Xpress SARS-CoV-2/FLU/RSV  testing.  Fact Sheet for Patients: PinkCheek.be  Fact Sheet for Healthcare Providers: GravelBags.it  This test is not yet approved or cleared by the [place] FDA and  has been authorized for detection and/or diagnosis of SARS-CoV-2 by  FDA under an Emergency Use Authorization (EUA). This EUA will remain  in effect (meaning this test can be used) for the duration of the  Covid-19 declaration under Section 564(b)(1) of the Act, 21  U.S.C. section 360bbb-3(b)(1), unless the  authorization is  terminated or revoked. Performed at [facility], [place] [address] [place], [place] [address]     Imaging: CT Abd/Pelvis 12/21/19 IMPRESSION: 1. 6 mm stone in the left ureteropelvic junction with moderate proximal obstruction. 2. Multiple nonobstructing stones in the right kidney. 3. Peripheral fibrosis and emphysematous changes in the lung bases. 4. Small esophageal hiatal hernia.  Emphysema (ICD10-J43.9).  A/P: 64 year old woman who developed acute onset left flank pain with nausea and vomiting found to have a 6 mm left UPJ calculus with moderate hydronephrosis.  Patient was initially seen in the office on 12/22/2019 and was feeling okay but then pain worsened and she represented to the ER.  -As she has now been seen in the ER twice in the office once we discussed proceeding with cystoscopy, possible ureteroscopy with laser lithotripsy and stent placement -Risks and benefits of the procedure were discussed with the patient including but not limited to infection, pain, bleeding, stent discomfort, need for staged procedure, inability to remove stone, damage to surrounding structures including kidney/ureter/urethra/bladder. -We will proceed to the OR this morning   Thank you for involving me in this patient's care.  Please page with any further questions or concerns. Iden Stripling D Mirtha Jain

## 2019-12-23 NOTE — ED Notes (Signed)
Pt was transported up to OR, personal belongings in labeled bag at pt bedside.

## 2019-12-23 NOTE — Interval H&P Note (Signed)
History and Physical Interval Note:  12/23/2019 9:30 AM  [name]  has presented today for surgery, with the diagnosis of left ureteral calculus.  The various methods of treatment have been discussed with the patient and family. After consideration of risks, benefits and other options for treatment, the patient has consented to  Procedure(s): CYSTOSCOPY/URETEROSCOPY/HOLMIUM LASER/STENT PLACEMENT (Left) as a surgical intervention.  The patient's history has been reviewed, patient examined, no change in status, stable for surgery.  I have reviewed the patient's chart and labs.  Questions were answered to the patient's satisfaction.     Waylynn Benefiel D Anasophia Pecor

## 2019-12-23 NOTE — ED Notes (Signed)
MD will treat pt BP in PACU

## 2019-12-23 NOTE — Consult Note (Signed)
I have been asked to see the patient by Dr. Charlesetta Shanks, for evaluation and management of left ureteral calculus.   History of present illness: Patient history below seen yesterday in the office.  She returns to ED with intractable pain.   64 year old woman presents to the ER yesterday with severe left flank pain and emesis found to have a 6 mm left UPJ calculus with moderate hydronephrosis. Patient also has multiple nonobstructing renal calculi. This is patient's 1st stone episode. Her nausea resolved with 1 dose of Zofran in the emergency room. She has been able to keep food and water down and pain is controlled with oral pain medication. Patient broke her left foot and is taking schedule pain medication which is helping the kidney stone pain. Patient has a longstanding history of urinary urgency since her 10s. She was told she has a short urethra as the cause. Patient is retired Marine scientist. Urinalysis without concerning findings for infection.    Review of systems: A 12 point comprehensive review of systems was obtained and is negative unless otherwise stated in the history of present illness.  Patient Active Problem List   Diagnosis Date Noted  . Abdominal pain, left lower quadrant 10/27/2018  . Loose stools 10/27/2018  . Iron deficiency anemia due to chronic blood loss 04/14/2018  . Iron malabsorption 04/14/2018  . GIB (gastrointestinal bleeding) 04/14/2018  . Chronic anticoagulation (warfarin for Afib) 08/06/2017  . Gastric polyp   . Fall 07/08/2017  . Scalp laceration 07/08/2017  . Hypokalemia 07/08/2017  . Recurrent diverticulitis s/p robotic sigmoid colectomy 08/06/2017 11/24/2016  . Right hip pain 05/14/2015  . Obesity (BMI 30-39.9) 01/10/2014  . Sleep apnea 01/10/2014  . Encounter for therapeutic drug monitoring 04/26/2013  . Essential hypertension 03/01/2013  . History of DVT (deep vein thrombosis) 01/12/2013  . Hyperglycemia 01/12/2013  . Dizziness 07/09/2010  .  HYPERCHOLESTEROLEMIA 09/10/2009  . Carpal tunnel syndrome 09/10/2009  . Asthma 09/10/2009  . TMJ SYNDROME 09/10/2009  . GERD 09/10/2009  . GALLBLADDER DISEASE 09/10/2009    No current facility-administered medications on file prior to encounter.   Current Outpatient Medications on File Prior to Encounter  Medication Sig Dispense Refill  . acetaminophen (TYLENOL) 650 MG CR tablet Take 1,300 mg by mouth every 8 (eight) hours as needed for pain.     . cetirizine (ZYRTEC) 10 MG tablet Take 10 mg by mouth daily.      . Cholecalciferol (VITAMIN D3) 1000 UNITS CAPS Take 2,000 Units by mouth daily.     Marland Kitchen dicyclomine (BENTYL) 10 MG capsule TAKE 1 CAPSULE BY MOUTH 30 MINS BEFORE MEALS AND AT BEDTIME AS NEEDED FOR PAIN/CRAMPING 360 capsule 1  . FLOVENT HFA 220 MCG/ACT inhaler Inhale 1 puff into the lungs 2 (two) times daily.  1  . fluticasone (FLONASE) 50 MCG/ACT nasal spray Place 1 spray into both nostrils 2 (two) times daily.     Marland Kitchen losartan (COZAAR) 25 MG tablet Take 1 tablet (25 mg total) by mouth daily. 90 tablet 0  . meclizine (ANTIVERT) 25 MG tablet Take 1 tablet (25 mg total) by mouth 3 (three) times daily as needed for dizziness. 30 tablet 0  . Multiple Vitamin (MULTIVITAMIN WITH MINERALS) TABS tablet Take 1 tablet by mouth daily.    Marland Kitchen omeprazole (PRILOSEC) 40 MG capsule Take 1 capsule (40 mg total) by mouth 2 (two) times daily. Must have an office visit for any further refills. Thank you 180 capsule 0  . ondansetron (ZOFRAN ODT) 8 MG  disintegrating tablet Take 1 tablet (8 mg total) by mouth every 8 (eight) hours as needed. 8 tablet 0  . oxyCODONE-acetaminophen (PERCOCET/ROXICET) 5-325 MG tablet Take 1-2 tablets by mouth every 4 (four) hours as needed for severe pain. 6 tablet 0  . PROAIR HFA 108 (90 BASE) MCG/ACT inhaler Inhale 2 puffs into the lungs every 6 (six) hours as needed for wheezing or shortness of breath.     . sucralfate (CARAFATE) 1 GM/10ML suspension Take 10 mLs (1 g total) by  mouth every 6 (six) hours as needed. 420 mL 1  . venlafaxine XR (EFFEXOR-XR) 37.5 MG 24 hr capsule Take 37.5 mg by mouth daily with breakfast.     . verapamil (CALAN) 80 MG tablet Take 160 mg by mouth 2 (two) times daily.    Marland Kitchen warfarin (COUMADIN) 2 MG tablet TAKE AS DIRECTED BY COUMADIN CLINIC 225 tablet 1    Past Medical History:  Diagnosis Date  . Allergic rhinitis   . Arthritis    "back" (05/07/2016)  . Asthma   . Atrial flutter ([place])    a. Remotely per notes.  . Carpal tunnel syndrome, bilateral   . Colon polyps   . Diverticulitis   . DVT (deep venous thrombosis) ([place]) 1980s x2 -    between birth of two sons. Saw hematology - was told she has a hypercoagulable disorder, should be on Coumadin lifelong.  Marland Kitchen Dysrhythmia    atrial fib  . Family history of adverse reaction to anesthesia    "mother gets PONV"  . Gallbladder disease   . GERD (gastroesophageal reflux disease)   . GIB (gastrointestinal bleeding) 04/14/2018  . Halothane adverse reaction    narrow small opening  . Heart murmur   . Hiatal hernia   . History of hiatal hernia    "small one/CTA 05/01/2016" (05/07/2016)  . HTN (hypertension)   . Hypercholesteremia    hx; "brought it down w/diet" (05/07/2016)  . Hyperglycemia    a. A1c 6.1 in 2014.  . Inguinal hernia   . Iron deficiency anemia due to chronic blood loss 04/14/2018  . Iron malabsorption 04/14/2018  . Left sided sciatica   . Normal cardiac stress test 06/2012  . Obesity   . OSA (obstructive sleep apnea)    a. Mild, did not tolerate CPAP (05/07/2016)  . PAF (paroxysmal atrial fibrillation) ([place])    a. s/p afib ablation at [facility] 2010 (Dr. Annabell Howells). b. Recurrent AF s/p DCCV 06/2010. c. On flecainide.   . Paroxysmal atrial fibrillation ([place]) 09/09/2009   Qualifier: Diagnosis of  By: Selena Batten CMA, [place]    . PONV (postoperative nausea and vomiting)   . PPD positive, treated 1977   "treated for 1 yr after exposure to patient"  . Pre-diabetes   . RSV infection  ~ 2006  . Wandering (atrial) pacemaker    a. Remotely per notes.  (  pt. states has a wandering p wave ) no pacemaker    Past Surgical History:  Procedure Laterality Date  . ATRIAL ABLATION SURGERY  05/07/2016   "fib and flutter"  . ATRIAL FIBRILLATION ABLATION  2010   at [facility]  . ATRIAL FIBRILLATION ABLATION N/A 05/07/2016   Procedure: Atrial Fibrillation Ablation;  Surgeon: Thompson Grayer, MD;  Location: [facility];  Service: Cardiovascular;  Laterality: N/A;  . BREAST CYST ASPIRATION Bilateral   . CARDIAC CATHETERIZATION  2008  . CARPAL TUNNEL RELEASE Bilateral   . CESAREAN SECTION  1986; 1988  . COLONOSCOPY  X 2  . COLONOSCOPY W/ BIOPSIES AND POLYPECTOMY  X 2  . ESOPHAGOGASTRODUODENOSCOPY    . ESOPHAGOGASTRODUODENOSCOPY (EGD) WITH PROPOFOL N/A 07/19/2017   Procedure: ESOPHAGOGASTRODUODENOSCOPY (EGD) WITH PROPOFOL;  Surgeon: Yetta Flock, MD;  Location: [facility];  Service: Gastroenterology;  Laterality: N/A;  . INGUINAL HERNIA REPAIR Right   . LAPAROSCOPIC CHOLECYSTECTOMY  1989  . POLYPECTOMY N/A 07/19/2017   Procedure: POLYPECTOMY;  Surgeon: Yetta Flock, MD;  Location: [facility];  Service: Gastroenterology;  Laterality: N/A;  . PROCTOSCOPY N/A 08/06/2017   Procedure: PROCTOSCOPY;  Surgeon: Michael Boston, MD;  Location: [facility];  Service: General;  Laterality: N/A;  . TMJ ARTHROPLASTY    . TUBAL LIGATION  1988    Social History   Tobacco Use  . Smoking status: Never Smoker  . Smokeless tobacco: Never Used  Vaping Use  . Vaping Use: Never used  Substance Use Topics  . Alcohol use: Not Currently    Comment: 07-13-17  . Drug use: No    Family History  Problem Relation Age of Onset  . Stroke Mother   . Heart attack Mother        2 previous MIs, 3 stents - CAD first diagnosed 37  . Thyroid disease Mother   . Colon polyps Father   . Lupus Sister   . Heart attack Paternal Grandfather        Died at 101 of massive MI  . Stroke Maternal  Grandfather        Multiple family members  . Stroke Paternal Grandmother   . Thyroid disease Sister        x 2  . Colon cancer Neg Hx   . Stomach cancer Neg Hx     PE: Vitals:   12/23/19 0308 12/23/19 0500 12/23/19 0530 12/23/19 0600  BP: (!) 185/91 (!) 189/92 (!) 194/98 (!) 192/88  Pulse: 78 89 85 88  Resp: 15 16  16   Temp:      TempSrc:      SpO2: 91% 94% 95% 98%  Weight:      Height:       Patient appears to be in no acute distress  patient is alert and oriented x3 Atraumatic normocephalic head No cervical or supraclavicular lymphadenopathy appreciated No increased work of breathing, no audible wheezes/rhonchi Regular sinus rhythm/rate Abdomen is soft, nondistended Lower extremities are symmetric without appreciable edema Grossly neurologically intact No identifiable skin lesions  Recent Labs    12/21/19 0242 12/22/19 2252  WBC 8.3 8.8  HGB 12.0 12.5  HCT 36.2 37.9   Recent Labs    12/21/19 0242 12/22/19 2252  NA 139 135  K 3.9 3.9  CL 102 100  CO2 28 25  GLUCOSE 165* 160*  BUN 13 12  CREATININE 0.59 0.78  CALCIUM 8.9 9.1   Recent Labs    12/23/19 0158  INR 2.7*   No results for input(s): LABURIN in the last 72 hours. Results for orders placed or performed during the hospital encounter of 12/22/19  Respiratory Panel by RT PCR (Flu A&B, Covid) - Nasopharyngeal Swab     Status: None   Collection Time: 12/23/19  2:18 AM   Specimen: Nasopharyngeal Swab  Result Value Ref Range Status   SARS Coronavirus 2 by RT PCR NEGATIVE NEGATIVE Final    Comment: (NOTE) SARS-CoV-2 target nucleic acids are NOT DETECTED.  The SARS-CoV-2 RNA is generally detectable in upper respiratoy specimens during the acute phase of infection. The lowest concentration of  SARS-CoV-2 viral copies this assay can detect is 131 copies/mL. A negative result does not preclude SARS-Cov-2 infection and should not be used as the sole basis for treatment or other patient management  decisions. A negative result may occur with  improper specimen collection/handling, submission of specimen other than nasopharyngeal swab, presence of viral mutation(s) within the areas targeted by this assay, and inadequate number of viral copies (<131 copies/mL). A negative result must be combined with clinical observations, patient history, and epidemiological information. The expected result is Negative.  Fact Sheet for Patients:  PinkCheek.be  Fact Sheet for Healthcare Providers:  GravelBags.it  This test is no t yet approved or cleared by the [place] FDA and  has been authorized for detection and/or diagnosis of SARS-CoV-2 by FDA under an Emergency Use Authorization (EUA). This EUA will remain  in effect (meaning this test can be used) for the duration of the COVID-19 declaration under Section 564(b)(1) of the Act, 21 U.S.C. section 360bbb-3(b)(1), unless the authorization is terminated or revoked sooner.     Influenza A by PCR NEGATIVE NEGATIVE Final   Influenza B by PCR NEGATIVE NEGATIVE Final    Comment: (NOTE) The Xpert Xpress SARS-CoV-2/FLU/RSV assay is intended as an aid in  the diagnosis of influenza from Nasopharyngeal swab specimens and  should not be used as a sole basis for treatment. Nasal washings and  aspirates are unacceptable for Xpert Xpress SARS-CoV-2/FLU/RSV  testing.  Fact Sheet for Patients: PinkCheek.be  Fact Sheet for Healthcare Providers: GravelBags.it  This test is not yet approved or cleared by the [place] FDA and  has been authorized for detection and/or diagnosis of SARS-CoV-2 by  FDA under an Emergency Use Authorization (EUA). This EUA will remain  in effect (meaning this test can be used) for the duration of the  Covid-19 declaration under Section 564(b)(1) of the Act, 21  U.S.C. section 360bbb-3(b)(1), unless the  authorization is  terminated or revoked. Performed at [facility], [place] [address] [place], [place] [address]     Imaging: CT Abd/Pelvis 12/21/19 IMPRESSION: 1. 6 mm stone in the left ureteropelvic junction with moderate proximal obstruction. 2. Multiple nonobstructing stones in the right kidney. 3. Peripheral fibrosis and emphysematous changes in the lung bases. 4. Small esophageal hiatal hernia.  Emphysema (ICD10-J43.9).  A/P: 64 year old woman who developed acute onset left flank pain with nausea and vomiting found to have a 6 mm left UPJ calculus with moderate hydronephrosis.  Patient was initially seen in the office on 12/22/2019 and was feeling okay but then pain worsened and she represented to the ER.  -As she has now been seen in the ER twice in the office once we discussed proceeding with cystoscopy, possible ureteroscopy with laser lithotripsy and stent placement -Risks and benefits of the procedure were discussed with the patient including but not limited to infection, pain, bleeding, stent discomfort, need for staged procedure, inability to remove stone, damage to surrounding structures including kidney/ureter/urethra/bladder. -We will proceed to the OR this morning   Thank you for involving me in this patient's care.  Please page with any further questions or concerns. Ed Rayson D Hines Kloss

## 2019-12-23 NOTE — Anesthesia Preprocedure Evaluation (Signed)
Anesthesia Evaluation  Patient identified by MRN, date of birth, ID band Patient awake    Reviewed: Allergy & Precautions, H&P , NPO status , Patient's Chart, lab work & pertinent test results  History of Anesthesia Complications (+) PONV and history of anesthetic complications  Airway Mallampati: II   Neck ROM: full    Dental   Pulmonary asthma , sleep apnea ,    breath sounds clear to auscultation       Cardiovascular hypertension, + dysrhythmias Atrial Fibrillation  Rhythm:regular Rate:Normal     Neuro/Psych  Neuromuscular disease    GI/Hepatic hiatal hernia, GERD  ,  Endo/Other    Renal/GU stones     Musculoskeletal  (+) Arthritis ,   Abdominal   Peds  Hematology   Anesthesia Other Findings   Reproductive/Obstetrics                             Anesthesia Physical Anesthesia Plan  ASA: III  Anesthesia Plan: General   Post-op Pain Management:    Induction: Intravenous  PONV Risk Score and Plan: 4 or greater and Ondansetron, Dexamethasone, Midazolam and Treatment may vary due to age or medical condition  Airway Management Planned: LMA  Additional Equipment:   Intra-op Plan:   Post-operative Plan: Extubation in OR  Informed Consent: I have reviewed the patients History and Physical, chart, labs and discussed the procedure including the risks, benefits and alternatives for the proposed anesthesia with the patient or authorized representative who has indicated his/her understanding and acceptance.       Plan Discussed with: CRNA, Anesthesiologist and Surgeon  Anesthesia Plan Comments:         Anesthesia Quick Evaluation

## 2019-12-23 NOTE — ED Notes (Signed)
Two sets of earrings and wedding band and engagement ring placed in specimen cup, labeled with pt sticker and placed in belongings bag which is labeled.

## 2019-12-23 NOTE — Discharge Instructions (Signed)
DISCHARGE INSTRUCTIONS FOR KIDNEY STONE/URETERAL STENT   MEDICATIONS:  1.  Resume all your other meds from home 2. Take Cipro one hour prior to removal of your stent.   ACTIVITY:  1. No strenuous activity x 1week  2. No driving while on narcotic pain medications  3. Drink plenty of water  4. Continue to walk at home - you can still get blood clots when you are at home, so keep active, but don't over do it.  5. May return to work/school tomorrow or when you feel ready   BATHING:  1. You can shower and we recommend daily showers  2. You have a string coming from your urethra: The stent string is attached to your ureteral stent. Do not pull on this.   SIGNS/SYMPTOMS TO CALL:  Please call us if you have a fever greater than 101.5, uncontrolled nausea/vomiting, uncontrolled pain, dizziness, unable to urinate, bloody urine, chest pain, shortness of breath, leg swelling, leg pain, redness around wound, drainage from wound, or any other concerns or questions.   You can reach [place] at [phone].   FOLLOW-UP:  1. You have a string attached to your stent, you may remove it on Tuesday, October 5. To do this, pull the string until the stent is completely removed. You may feel an odd sensation in your back.  Please take the antibiotic prior to stent removal.

## 2019-12-23 NOTE — Transfer of Care (Signed)
Immediate Anesthesia Transfer of Care Note  Patient: [name]  Procedure(s) Performed: CYSTOSCOPY/URETEROSCOPY/HOLMIUM LASER/STENT PLACEMENT (Left )  Patient Location: PACU  Anesthesia Type:General  Level of Consciousness: sedated, patient cooperative and responds to stimulation  Airway & Oxygen Therapy: Patient Spontanous Breathing and Patient connected to face mask oxygen  Post-op Assessment: Report given to RN and Post -op Vital signs reviewed and stable  Post vital signs: Reviewed and stable  Last Vitals:  Vitals Value Taken Time  BP    Temp    Pulse    Resp    SpO2      Last Pain:  Vitals:   12/23/19 0921  TempSrc:   PainSc: 10-Worst pain ever         Complications: No complications documented.

## 2019-12-23 NOTE — ED Notes (Addendum)
EDP and urology aware of BP

## 2019-12-23 NOTE — Anesthesia Procedure Notes (Signed)
Procedure Name: LMA Insertion Date/Time: 12/23/2019 9:43 AM Performed by: Sharlette Dense, CRNA Patient Re-evaluated:Patient Re-evaluated prior to induction Oxygen Delivery Method: Circle system utilized Preoxygenation: Pre-oxygenation with 100% oxygen Induction Type: IV induction Ventilation: Mask ventilation without difficulty LMA: LMA with gastric port inserted LMA Size: 4.0 Number of attempts: 1 Placement Confirmation: positive ETCO2 and breath sounds checked- equal and bilateral Tube secured with: Tape Dental Injury: Teeth and Oropharynx as per pre-operative assessment

## 2019-12-23 NOTE — Op Note (Signed)
Preoperative diagnosis: left ureteral calculus  Postoperative diagnosis: left ureteral calculus  Procedure:  1. Cystoscopy 2. left ureteroscopy, laser lithotripsy, basket stone extraction 3. left 6Fr x 24cm ureteral stent placement  4. left retrograde pyelography with interpretation  Surgeon: Jacalyn Lefevre, MD  Anesthesia: General  Complications: None  Intraoperative findings: 1. left retrograde pyelography demonstrated a filling defect within the left ureter consistent with the patient's known calculus without other abnormalities 2. Small calcifications adherent to renal parenchyma seen  EBL: Minimal  Specimens: 1. left ureteral calculus  Disposition of specimens: [facility] for stone analysis  Indication: [name] is a 64 y.o.   patient with a 79mm left ureteral stone and associated left symptoms. After reviewing the management options for treatment, the patient elected to proceed with the above surgical procedure(s). We have discussed the potential benefits and risks of the procedure, side effects of the proposed treatment, the likelihood of the patient achieving the goals of the procedure, and any potential problems that might occur during the procedure or recuperation. Informed consent has been obtained.   Description of procedure:  The patient was taken to the operating room and general anesthesia was induced.  The patient was placed in the dorsal lithotomy position, prepped and draped in the usual sterile fashion, and preoperative antibiotics were administered. A preoperative time-out was performed.   Cystourethroscopy was performed.  The patient's urethra was examined and was normal. Attention then turned to the left ureteral orifice and a ureteral catheter was used to intubate the ureteral orifice.  Omnipaque contrast was injected through the ureteral catheter and a retrograde pyelogram was performed with findings as dictated above.  A 0.38  sensor guidewire was then advanced up the left ureter into the renal pelvis under fluoroscopic guidance. The 4.6 Fr semirigid ureteroscope was then advanced into the ureter next to the guidewire to the level of the UPJ and no stone was seen.  A second sensor wire was then placed through the ureteroscope and the ureteroscope was removed.  A ureteral access sheath was placed over the second wire with fluoroscopic guidance and the inner sheath and wire removed.  Flexible ureteroscopy then took place which encountered the stone in the lower pole. The stone was then fragmented with the 242 micron holmium laser fiber.  Stones fragments were then removed from the ureter with an 0 tip basket.  Reinspection of the ureter revealed no remaining visible stones or fragments >2 mm.   The wire was then backloaded through the cystoscope and a ureteral stent was advance over the wire using Seldinger technique.  The stent was positioned appropriately under fluoroscopic and cystoscopic guidance.  The wire was then removed with an adequate stent curl noted in the renal pelvis as well as in the bladder.  The bladder was then emptied and the procedure ended.  The patient appeared to tolerate the procedure well and without complications.  The patient was able to be awakened and transferred to the recovery unit in satisfactory condition.   Disposition: The tether of the stent was left on and tucked inside the patient's vagina.  Instructions for removing the stent have been provided to the patient.

## 2019-12-25 ENCOUNTER — Encounter (HOSPITAL_COMMUNITY): Payer: Self-pay | Admitting: Urology

## 2019-12-25 NOTE — Anesthesia Postprocedure Evaluation (Signed)
Anesthesia Post Note  Patient: [name]  Procedure(s) Performed: CYSTOSCOPY/URETEROSCOPY/HOLMIUM LASER/STENT PLACEMENT (Left )     Patient location during evaluation: PACU Anesthesia Type: General Level of consciousness: awake and alert Pain management: pain level controlled Vital Signs Assessment: post-procedure vital signs reviewed and stable Respiratory status: spontaneous breathing, nonlabored ventilation, respiratory function stable and patient connected to nasal cannula oxygen Cardiovascular status: blood pressure returned to baseline and stable Postop Assessment: no apparent nausea or vomiting Anesthetic complications: no   No complications documented.  Last Vitals:  Vitals:   12/23/19 1143 12/23/19 1204  BP: (!) 165/85 106/68  Pulse: 89 77  Resp: 18 17  Temp:  36.6 C  SpO2: 97% 95%    Last Pain:  Vitals:   12/23/19 1204  TempSrc:   PainSc: 0-No pain                 Jayvin Hurrell S

## 2019-12-26 ENCOUNTER — Other Ambulatory Visit: Payer: Self-pay | Admitting: Orthopedic Surgery

## 2019-12-26 DIAGNOSIS — M79672 Pain in left foot: Secondary | ICD-10-CM

## 2019-12-26 LAB — URINE CULTURE: Culture: <10000 — AB

## 2019-12-27 ENCOUNTER — Other Ambulatory Visit: Payer: Self-pay

## 2019-12-27 ENCOUNTER — Ambulatory Visit
Admission: RE | Admit: 2019-12-27 | Discharge: 2019-12-27 | Disposition: A | Discharge status: Home / Self Care | Payer: Medicare Other | Source: Ambulatory Visit | Attending: Orthopedic Surgery | Admitting: Orthopedic Surgery

## 2019-12-27 DIAGNOSIS — M79672 Pain in left foot: Secondary | ICD-10-CM

## 2020-01-03 ENCOUNTER — Other Ambulatory Visit: Payer: Self-pay

## 2020-01-03 ENCOUNTER — Ambulatory Visit (INDEPENDENT_AMBULATORY_CARE_PROVIDER_SITE_OTHER): Payer: Medicare Other | Admitting: *Deleted

## 2020-01-03 DIAGNOSIS — I4891 Unspecified atrial fibrillation: Secondary | ICD-10-CM

## 2020-01-03 DIAGNOSIS — Z5181 Encounter for therapeutic drug level monitoring: Secondary | ICD-10-CM | POA: Diagnosis not present

## 2020-01-03 DIAGNOSIS — I480 Paroxysmal atrial fibrillation: Secondary | ICD-10-CM

## 2020-01-03 LAB — POCT INR: INR: 2 (ref 2.0–3.0)

## 2020-01-03 NOTE — Patient Instructions (Signed)
Description   Today take 6mg  then continue taking Warfarin 4mg  daily except 6mg  on Tuesdays, Thursdays, and Saturdays. Recheck INR 8 weeks.  Call Coumadin Clinic [phone] Main # [phone] with any questions

## 2020-01-12 ENCOUNTER — Encounter (HOSPITAL_COMMUNITY): Payer: Self-pay | Admitting: *Deleted

## 2020-01-12 ENCOUNTER — Emergency Department (HOSPITAL_COMMUNITY)
Admission: EM | Admit: 2020-01-12 | Discharge: 2020-01-12 | Disposition: A | Discharge status: Home / Self Care | Payer: Medicare Other | Attending: Emergency Medicine | Admitting: Emergency Medicine

## 2020-01-12 ENCOUNTER — Other Ambulatory Visit: Payer: Self-pay

## 2020-01-12 DIAGNOSIS — Z91040 Latex allergy status: Secondary | ICD-10-CM | POA: Diagnosis not present

## 2020-01-12 DIAGNOSIS — I480 Paroxysmal atrial fibrillation: Secondary | ICD-10-CM | POA: Insufficient documentation

## 2020-01-12 DIAGNOSIS — Z7901 Long term (current) use of anticoagulants: Secondary | ICD-10-CM | POA: Diagnosis not present

## 2020-01-12 DIAGNOSIS — I10 Essential (primary) hypertension: Secondary | ICD-10-CM | POA: Insufficient documentation

## 2020-01-12 DIAGNOSIS — R040 Epistaxis: Secondary | ICD-10-CM | POA: Diagnosis not present

## 2020-01-12 DIAGNOSIS — J45909 Unspecified asthma, uncomplicated: Secondary | ICD-10-CM | POA: Insufficient documentation

## 2020-01-12 DIAGNOSIS — Z7952 Long term (current) use of systemic steroids: Secondary | ICD-10-CM | POA: Insufficient documentation

## 2020-01-12 LAB — BASIC METABOLIC PANEL
Anion gap: 11 (ref 5–15)
BUN: 14 mg/dL (ref 8–23)
CO2: 23 mmol/L (ref 22–32)
Calcium: 9.3 mg/dL (ref 8.9–10.3)
Chloride: 105 mmol/L (ref 98–111)
Creatinine, Ser: 0.73 mg/dL (ref 0.44–1.00)
GFR, Estimated: >60 mL/min (ref >60)
Glucose, Bld: 113 mg/dL — ABNORMAL HIGH (ref 70–99)
Potassium: 3.5 mmol/L (ref 3.5–5.1)
Sodium: 139 mmol/L (ref 135–145)

## 2020-01-12 LAB — CBC WITH DIFFERENTIAL/PLATELET
Abs Immature Granulocytes: 0.02 10*3/uL (ref 0.00–0.07)
Basophils Absolute: 0.1 10*3/uL (ref 0.0–0.1)
Basophils Relative: 1 %
Eosinophils Absolute: 0.1 10*3/uL (ref 0.0–0.5)
Eosinophils Relative: 2 %
HCT: 41 % (ref 36.0–46.0)
Hemoglobin: 13.6 g/dL (ref 12.0–15.0)
Immature Granulocytes: 0 %
Lymphocytes Relative: 43 %
Lymphs Abs: 3.3 10*3/uL (ref 0.7–4.0)
MCH: 30.1 pg (ref 26.0–34.0)
MCHC: 33.2 g/dL (ref 30.0–36.0)
MCV: 90.7 fL (ref 80.0–100.0)
Monocytes Absolute: 0.7 10*3/uL (ref 0.1–1.0)
Monocytes Relative: 9 %
Neutro Abs: 3.5 10*3/uL (ref 1.7–7.7)
Neutrophils Relative %: 45 %
Platelets: 372 10*3/uL (ref 150–400)
RBC: 4.52 MIL/uL (ref 3.87–5.11)
RDW: 12.5 % (ref 11.5–15.5)
WBC: 7.6 10*3/uL (ref 4.0–10.5)
nRBC: 0 % (ref 0.0–0.2)

## 2020-01-12 LAB — PROTIME-INR
INR: 2.4 — ABNORMAL HIGH (ref 0.8–1.2)
Prothrombin Time: 25.1 seconds — ABNORMAL HIGH (ref 11.4–15.2)

## 2020-01-12 MED ORDER — OXYMETAZOLINE HCL 0.05 % NA SOLN
1.0000 | Freq: Once | NASAL | Status: AC
  Administered 2020-01-12: 1 via NASAL
  Filled 2020-01-12: qty 30

## 2020-01-12 NOTE — ED Provider Notes (Signed)
[facility] DEPT Provider Note   CSN: [id number] Arrival date & time: 01/12/20  1325    History Chief Complaint  Patient presents with  . Epistaxis    [name] is a 64 y.o. female with past medical history significant for Afib and Hypercoagulablility on Coumadin, allergic rhinitis, arthritis, asthma who presents for evaluation of nosebleed.  Patient states she has had nosebleeds previously however they normally stop after 1 or 2 minutes. This episode lasted approximately 1 hour.  She does use Flonase daily.  Patient states she did wake up this morning with congestion, frontal sinus headache.  States she has chronic "nasal problems."  Was previously followed by ENT in [place] who is not followed by anyone [place].  No prior nasal surgeries.  Patient states she does take Flonase, Zyrtec regularly for her chronic sinus congestion.  No sudden onset thunderclap headache.  Has had some lightheadedness since her nose started bleeding.  Does have some dizziness which she has had prior vertigo and takes meclizine as needed.  No syncope.  No facial droop, paresthesias unilateral weakness, gait instability.  Denies additional aggravating or alleviating factors.  History obtained from patient and past medical records.  No interpreter used.  HPI     Past Medical History:  Diagnosis Date  . Allergic rhinitis   . Arthritis    "back" (05/07/2016)  . Asthma   . Atrial flutter ([place])    a. Remotely per notes.  . Carpal tunnel syndrome, bilateral   . Colon polyps   . Diverticulitis   . DVT (deep venous thrombosis) ([place]) 1980s x2 -    between birth of two sons. Saw hematology - was told she has a hypercoagulable disorder, should be on Coumadin lifelong.  Marland Kitchen Dysrhythmia    atrial fib  . Family history of adverse reaction to anesthesia    "mother gets PONV"  . Gallbladder disease   . GERD (gastroesophageal reflux disease)   . GIB (gastrointestinal  bleeding) 04/14/2018  . Halothane adverse reaction    narrow small opening  . Heart murmur   . Hiatal hernia   . History of hiatal hernia    "small one/CTA 05/01/2016" (05/07/2016)  . HTN (hypertension)   . Hypercholesteremia    hx; "brought it down w/diet" (05/07/2016)  . Hyperglycemia    a. A1c 6.1 in 2014.  . Inguinal hernia   . Iron deficiency anemia due to chronic blood loss 04/14/2018  . Iron malabsorption 04/14/2018  . Left sided sciatica   . Normal cardiac stress test 06/2012  . Obesity   . OSA (obstructive sleep apnea)    a. Mild, did not tolerate CPAP (05/07/2016)  . PAF (paroxysmal atrial fibrillation) ([place])    a. s/p afib ablation at [facility] 2010 (Dr. Annabell Howells). b. Recurrent AF s/p DCCV 06/2010. c. On flecainide.   . Paroxysmal atrial fibrillation ([place]) 09/09/2009   Qualifier: Diagnosis of  By: Selena Batten CMA, [place]    . PONV (postoperative nausea and vomiting)   . PPD positive, treated 1977   "treated for 1 yr after exposure to patient"  . Pre-diabetes   . RSV infection ~ 2006  . Wandering (atrial) pacemaker    a. Remotely per notes.  (  pt. states has a wandering p wave ) no pacemaker    Patient Active Problem List   Diagnosis Date Noted  . Abdominal pain, left lower quadrant 10/27/2018  . Loose stools 10/27/2018  . Iron deficiency anemia due to  chronic blood loss 04/14/2018  . Iron malabsorption 04/14/2018  . GIB (gastrointestinal bleeding) 04/14/2018  . Chronic anticoagulation (warfarin for Afib) 08/06/2017  . Gastric polyp   . Fall 07/08/2017  . Scalp laceration 07/08/2017  . Hypokalemia 07/08/2017  . Recurrent diverticulitis s/p robotic sigmoid colectomy 08/06/2017 11/24/2016  . Right hip pain 05/14/2015  . Obesity (BMI 30-39.9) 01/10/2014  . Sleep apnea 01/10/2014  . Encounter for therapeutic drug monitoring 04/26/2013  . Essential hypertension 03/01/2013  . History of DVT (deep vein thrombosis) 01/12/2013  . Hyperglycemia 01/12/2013  . Dizziness 07/09/2010   . HYPERCHOLESTEROLEMIA 09/10/2009  . Carpal tunnel syndrome 09/10/2009  . Asthma 09/10/2009  . TMJ SYNDROME 09/10/2009  . GERD 09/10/2009  . GALLBLADDER DISEASE 09/10/2009    Past Surgical History:  Procedure Laterality Date  . ATRIAL ABLATION SURGERY  05/07/2016   "fib and flutter"  . ATRIAL FIBRILLATION ABLATION  2010   at [facility]  . ATRIAL FIBRILLATION ABLATION N/A 05/07/2016   Procedure: Atrial Fibrillation Ablation;  Surgeon: Thompson Grayer, MD;  Location: [facility];  Service: Cardiovascular;  Laterality: N/A;  . BREAST CYST ASPIRATION Bilateral   . CARDIAC CATHETERIZATION  2008  . CARPAL TUNNEL RELEASE Bilateral   . CESAREAN SECTION  1986; 1988  . COLONOSCOPY  X 2  . COLONOSCOPY W/ BIOPSIES AND POLYPECTOMY  X 2  . CYSTOSCOPY/URETEROSCOPY/HOLMIUM LASER/STENT PLACEMENT Left 12/23/2019   Procedure: CYSTOSCOPY/URETEROSCOPY/HOLMIUM LASER/STENT PLACEMENT;  Surgeon: Robley Fries, MD;  Location: [facility];  Service: Urology;  Laterality: Left;  . ESOPHAGOGASTRODUODENOSCOPY    . ESOPHAGOGASTRODUODENOSCOPY (EGD) WITH PROPOFOL N/A 07/19/2017   Procedure: ESOPHAGOGASTRODUODENOSCOPY (EGD) WITH PROPOFOL;  Surgeon: Yetta Flock, MD;  Location: [facility];  Service: Gastroenterology;  Laterality: N/A;  . INGUINAL HERNIA REPAIR Right   . LAPAROSCOPIC CHOLECYSTECTOMY  1989  . POLYPECTOMY N/A 07/19/2017   Procedure: POLYPECTOMY;  Surgeon: Yetta Flock, MD;  Location: [facility];  Service: Gastroenterology;  Laterality: N/A;  . PROCTOSCOPY N/A 08/06/2017   Procedure: PROCTOSCOPY;  Surgeon: Michael Boston, MD;  Location: [facility];  Service: General;  Laterality: N/A;  . TMJ ARTHROPLASTY    . TUBAL LIGATION  1988     OB History   No obstetric history on file.     Family History  Problem Relation Age of Onset  . Stroke Mother   . Heart attack Mother        2 previous MIs, 3 stents - CAD first diagnosed 10  . Thyroid disease Mother   . Colon polyps Father     . Lupus Sister   . Heart attack Paternal Grandfather        Died at 37 of massive MI  . Stroke Maternal Grandfather        Multiple family members  . Stroke Paternal Grandmother   . Thyroid disease Sister        x 2  . Colon cancer Neg Hx   . Stomach cancer Neg Hx     Social History   Tobacco Use  . Smoking status: Never Smoker  . Smokeless tobacco: Never Used  Vaping Use  . Vaping Use: Never used  Substance Use Topics  . Alcohol use: Not Currently    Comment: 07-13-17  . Drug use: No    Home Medications Prior to Admission medications   Medication Sig Start Date End Date Taking? Authorizing Provider  acetaminophen (TYLENOL) 650 MG CR tablet Take 1,300 mg by mouth every 8 (eight) hours as needed  for pain.    Yes [provider]  cetirizine (ZYRTEC) 10 MG tablet Take 10 mg by mouth daily.     Yes [provider]  Cholecalciferol (VITAMIN D3) 1000 UNITS CAPS Take 2,000 Units by mouth daily.    Yes [provider]  dicyclomine (BENTYL) 10 MG capsule TAKE 1 CAPSULE BY MOUTH 30 MINS BEFORE MEALS AND AT BEDTIME AS NEEDED FOR PAIN/CRAMPING Patient taking differently: Take 10 mg by mouth daily as needed for spasms.  12/23/18  Yes Zehr, Laban Emperor, PA-C  FLOVENT HFA 220 MCG/ACT inhaler Inhale 1 puff into the lungs 2 (two) times daily. 04/20/16  Yes [provider]  fluticasone (FLONASE) 50 MCG/ACT nasal spray Place 1 spray into both nostrils 2 (two) times daily.  06/02/12  Yes [provider]  losartan (COZAAR) 25 MG tablet Take 1 tablet (25 mg total) by mouth daily. 12/20/13  Yes Allred, Jeneen Rinks, MD  meclizine (ANTIVERT) 25 MG tablet Take 1 tablet (25 mg total) by mouth 3 (three) times daily as needed for dizziness. 01/19/18  Yes Dorie Rank, MD  Multiple Vitamin (MULTIVITAMIN WITH MINERALS) TABS tablet Take 1 tablet by mouth daily.   Yes [provider]  omeprazole (PRILOSEC) 40 MG capsule Take 1 capsule (40 mg total) by mouth 2 (two) times  daily. Must have an office visit for any further refills. Thank you Patient taking differently: Take 40 mg by mouth daily.  05/03/19  Yes Armbruster, Carlota Raspberry, MD  ondansetron (ZOFRAN ODT) 8 MG disintegrating tablet Take 1 tablet (8 mg total) by mouth every 8 (eight) hours as needed. Patient taking differently: Take 8 mg by mouth every 8 (eight) hours as needed for nausea or vomiting.  12/21/19  Yes Ripley Fraise, MD  oxyCODONE (ROXICODONE) 5 MG immediate release tablet Take 5 mg by mouth every 4 (four) hours as needed for severe pain.   Yes [provider]  PROAIR HFA 108 (90 BASE) MCG/ACT inhaler Inhale 2 puffs into the lungs every 6 (six) hours as needed for wheezing or shortness of breath.  03/22/12  Yes [provider]  venlafaxine XR (EFFEXOR-XR) 37.5 MG 24 hr capsule Take 37.5 mg by mouth daily with breakfast.  11/21/12  Yes [provider]  verapamil (CALAN) 80 MG tablet Take 160 mg by mouth 2 (two) times daily.   Yes [provider]  warfarin (COUMADIN) 2 MG tablet TAKE AS DIRECTED BY COUMADIN CLINIC Patient taking differently: Take 4-6 mg by mouth See admin instructions. Takes 6 mg on tuesdays , thursdays and sundays, and 4 mg on all other days. 10/09/19  Yes Allred, Jeneen Rinks, MD  oxyCODONE-acetaminophen (PERCOCET/ROXICET) 5-325 MG tablet Take 1-2 tablets by mouth every 4 (four) hours as needed for severe pain. Patient not taking: Reported on 01/12/2020 12/14/19   Providence Lanius A, PA-C  sucralfate (CARAFATE) 1 GM/10ML suspension Take 10 mLs (1 g total) by mouth every 6 (six) hours as needed. Patient not taking: Reported on 01/12/2020 06/15/17   Yetta Flock, MD    Allergies    Aspirin, Banana, Food, Ivp dye [iodinated diagnostic agents], Latex, Meperidine hcl, Peanut-containing drug products, Penicillins, Statins, Sulfonamide derivatives, Tape, and Alfuzosin  Review of Systems   Review of Systems  Constitutional: Negative.   HENT: Positive for  nosebleeds, postnasal drip, rhinorrhea (Chronic) and sinus pressure. Negative for drooling, ear discharge, ear pain, facial swelling, hearing loss, sinus pain, sore throat, trouble swallowing and voice change.   Respiratory: Negative.   Cardiovascular: Negative.  Gastrointestinal: Negative.   Genitourinary: Negative.   Musculoskeletal: Negative.   Skin: Negative.   Neurological: Positive for light-headedness (Since nosebleed) and headaches (Frontal near sinuses). Negative for dizziness, tremors, seizures, syncope, facial asymmetry, speech difficulty, weakness and numbness.  All other systems reviewed and are negative.   Physical Exam Updated Vital Signs BP (!) 153/92   Pulse 70   Temp 98 F (36.7 C)   Resp 15   Ht 5\' 4"  (1.626 m)   Wt 96.7 kg   SpO2 93%   BMI 36.59 kg/m   Physical Exam  Physical Exam  Constitutional: Pt is oriented to person, place, and time. Pt appears well-developed and well-nourished. No distress.  HENT:  Head: Normocephalic and atraumatic.  Mouth/Throat: Oropharynx is clear and moist.  Eyes: Conjunctivae and EOM are normal. Pupils are equal, round, and reactive to light. No scleral icterus.  Nose: Mild os to right nares No horizontal, vertical or rotational nystagmus  Neck: Normal range of motion. Neck supple.  Full active and passive ROM without pain No midline or paraspinal tenderness No nuchal rigidity or meningeal signs  Cardiovascular: Normal rate, regular rhythm and intact distal pulses.   Pulmonary/Chest: Effort normal and breath sounds normal. No respiratory distress. Pt has no wheezes. No rales.  Abdominal: Soft. Bowel sounds are normal. There is no tenderness. There is no rebound and no guarding.  Musculoskeletal: Normal range of motion.  Lymphadenopathy:    No cervical adenopathy.  Neurological: Pt. is alert and oriented to person, place, and time. He has normal reflexes. No cranial nerve deficit.  Exhibits normal muscle tone. Coordination  normal.  Mental Status:  Alert, oriented, thought content appropriate. Speech fluent without evidence of aphasia. Able to follow 2 step commands without difficulty.  Cranial Nerves:  II:  Peripheral visual fields grossly normal, pupils equal, round, reactive to light III,IV, VI: ptosis not present, extra-ocular motions intact bilaterally  V,VII: smile symmetric, facial light touch sensation equal VIII: hearing grossly normal bilaterally  IX,X: midline uvula rise  XI: bilateral shoulder shrug equal and strong XII: midline tongue extension  Motor:  5/5 in upper and lower extremities bilaterally including strong and equal grip strength and dorsiflexion/plantar flexion Sensory: Pinprick and light touch normal in all extremities.  Deep Tendon Reflexes: 2+ and symmetric  Cerebellar: normal finger-to-nose with bilateral upper extremities Gait: normal gait and balance CV: distal pulses palpable throughout   Skin: Skin is warm and dry. No rash noted. Pt is not diaphoretic.  Psychiatric: Pt has a normal mood and affect. Behavior is normal. Judgment and thought content normal.  Nursing note and vitals reviewed. ED Results / Procedures / Treatments   Labs (all labs ordered are listed, but only abnormal results are displayed) Labs Reviewed  BASIC METABOLIC PANEL - Abnormal; Notable for the following components:      Result Value   Glucose, Bld 113 (*)    All other components within normal limits  PROTIME-INR - Abnormal; Notable for the following components:   Prothrombin Time 25.1 (*)    INR 2.4 (*)    All other components within normal limits  CBC WITH DIFFERENTIAL/PLATELET    EKG None  Radiology No results found.  Procedures .Epistaxis Management  Date/Time: 01/12/2020 4:49 PM Performed by: Nettie Elm, PA-C Authorized by: Nettie Elm, PA-C   Consent:    Consent obtained:  Verbal   Consent given by:  Patient   Risks discussed:  Bleeding, infection, nasal injury  and pain  Alternatives discussed:  Referral, observation, alternative treatment, delayed treatment and no treatment Anesthesia (see MAR for exact dosages):    Anesthesia method:  None Procedure details:    Treatment site:  Unable to specify   Repair method: Afrin.   Treatment complexity:  Limited   Treatment episode: initial   Post-procedure details:    Assessment:  Bleeding stopped   Patient tolerance of procedure:  Tolerated well, no immediate complications   (including critical care time)  Medications Ordered in ED Medications  oxymetazoline (AFRIN) 0.05 % nasal spray 1 spray (1 spray Each Nare Given 01/12/20 1416)    ED Course  I have reviewed the triage vital signs and the nursing notes.  Pertinent labs & imaging results that were available during my care of the patient were reviewed by me and considered in my medical decision making (see chart for details).  64 year old on chronic Coumadin for anticoagulation for A. fib as well as hypercoagulability with prior DVTs who presents for evaluation of epistaxis.  Began 1 hour PTA.  INR last checked 10 days ago.  Did have a frontal sinus headache this morning however patient states this is chronic and intermittent in nature.  Was previously seen by ENT in [place] however not here.  Was told this was due to recurrent sinusitis from small nasal septums.  Had some rhinorrhea, no sinus tenderness.  Has been lightheaded since her epistaxis.  With nonfocal neuro exam without deficits.  No paresthesias, weakness, facial droop, difficulty with word finding.  Denies sudden onset thunderclap headache.  No ataxic gait.  Does have some slight ooze to right nares, stopped with Afrin.  Plan on labs and observation.  Labs personally reviewed and interpreted:  CBC without leukocytosis, hemoglobin stable at 68.1 Metabolic panel mild hyperglycemia to 113 however no additional electrolyte, renal or liver abnormality INR 2.4, therapeutic  Patient  reassessed.  No complaints.  Her frontal sinus pressure as resolved with Afrin.  Patient is likely frontal sinus HA/ pressure her known recurrent headaches which she is previously seen by ENT for.   Presentation non concerning for [facility], ICH, dissection, CVA, head bleed, venous sinus thrombosis, Meningitis, or temporal arteritis. Pt is afebrile with no focal neuro deficits, nuchal rigidity, or change in vision.   She continues have nonfocal neuro exam without deficits.  She is ambulatory without difficulty.  Epistaxis is resolved with Afrin.  Discussed reassuring labs.  We will have her follow-up outpatient.  I discussed return precautions.  Patient voiced understanding and is agreeable to follow-up.  The patient has been appropriately medically screened and/or stabilized in the ED. I have low suspicion for any other emergent medical condition which would require further screening, evaluation or treatment in the ED or require inpatient management.  Patient is hemodynamically stable and in no acute distress.  Patient able to ambulate in department prior to ED.  Evaluation does not show acute pathology that would require ongoing or additional emergent interventions while in the emergency department or further inpatient treatment.  I have discussed the diagnosis with the patient and answered all questions.  Pain is been managed while in the emergency department and patient has no further complaints prior to discharge.  Patient is comfortable with plan discussed in room and is stable for discharge at this time.  I have discussed strict return precautions for returning to the emergency department.  Patient was encouraged to follow-up with PCP/specialist refer to at discharge.    MDM Rules/Calculators/A&P  Final Clinical Impression(s) / ED Diagnoses Final diagnoses:  Epistaxis    Rx / DC Orders ED Discharge Orders    None       Zaynah Chawla A, PA-C 01/12/20 1651      Lacretia Leigh, MD 01/13/20 [phone]

## 2020-01-12 NOTE — ED Triage Notes (Signed)
Pt arrives with nose bleed for approx 30 min, She is on Coumadin. Took 4 mg this am. Pt states she had a frontal headache this morning with dizziness this morning prior to nose bleed

## 2020-01-12 NOTE — Discharge Instructions (Signed)
Return for new or worsening symptoms

## 2020-02-02 ENCOUNTER — Encounter: Payer: Self-pay | Admitting: Podiatry

## 2020-02-02 ENCOUNTER — Other Ambulatory Visit: Payer: Self-pay

## 2020-02-02 ENCOUNTER — Ambulatory Visit (INDEPENDENT_AMBULATORY_CARE_PROVIDER_SITE_OTHER): Payer: Medicare Other | Admitting: Podiatry

## 2020-02-02 DIAGNOSIS — D689 Coagulation defect, unspecified: Secondary | ICD-10-CM

## 2020-02-02 DIAGNOSIS — M79674 Pain in right toe(s): Secondary | ICD-10-CM

## 2020-02-02 DIAGNOSIS — B351 Tinea unguium: Secondary | ICD-10-CM

## 2020-02-02 DIAGNOSIS — M79675 Pain in left toe(s): Secondary | ICD-10-CM

## 2020-02-02 NOTE — Progress Notes (Signed)
This patient returns to my office for at risk foot care.  This patient requires this care by a professional since this patient will be at risk due to having coagulation defect.  Patient is taking coumadin.  This patient is unable to cut nails herself since the patient cannot reach her nails.These nails are painful walking and wearing shoes.  This patient presents for at risk foot care today.  General Appearance  Alert, conversant and in no acute stress.  Vascular  Dorsalis pedis and posterior tibial  pulses are palpable  bilaterally.  Capillary return is within normal limits  bilaterally. Temperature is within normal limits  bilaterally.  Neurologic  Senn-Weinstein monofilament wire test within normal limits  bilaterally. Muscle power within normal limits bilaterally.  Nails Thick disfigured discolored nails with subungual debris  Second left and first and third right foot.  No evidence of bacterial infection or drainage bilaterally.  Orthopedic  No limitations of motion  feet .  No crepitus or effusions noted.  No bony pathology or digital deformities noted.  Skin  normotropic skin with no porokeratosis noted bilaterally.  No signs of infections or ulcers noted.     Onychomycosis  Pain in right toes  Pain in left toes  Consent was obtained for treatment procedures.   Mechanical debridement of nails 1-5  bilaterally performed with a nail nipper.  Filed with dremel without incident.    Return office visit   9 weeks                  Told patient to return for periodic foot care and evaluation due to potential at risk complications.   Nabor Thomann DPM  

## 2020-02-28 ENCOUNTER — Ambulatory Visit (INDEPENDENT_AMBULATORY_CARE_PROVIDER_SITE_OTHER): Payer: Medicare Other | Admitting: *Deleted

## 2020-02-28 ENCOUNTER — Other Ambulatory Visit: Payer: Self-pay

## 2020-02-28 DIAGNOSIS — Z5181 Encounter for therapeutic drug level monitoring: Secondary | ICD-10-CM | POA: Diagnosis not present

## 2020-02-28 DIAGNOSIS — I4891 Unspecified atrial fibrillation: Secondary | ICD-10-CM

## 2020-02-28 DIAGNOSIS — I480 Paroxysmal atrial fibrillation: Secondary | ICD-10-CM

## 2020-02-28 LAB — POCT INR: INR: 1.8 — AB (ref 2.0–3.0)

## 2020-02-28 NOTE — Patient Instructions (Signed)
Description   Today take 6mg  then continue taking Warfarin 4mg  daily except 6mg  on Tuesdays, Thursdays, and Saturdays. Recheck INR 6 weeks.  Call Coumadin Clinic [phone] Main # [phone] with any questions

## 2020-03-14 ENCOUNTER — Other Ambulatory Visit: Payer: Self-pay

## 2020-03-14 ENCOUNTER — Ambulatory Visit (INDEPENDENT_AMBULATORY_CARE_PROVIDER_SITE_OTHER): Payer: Medicare Other | Admitting: Internal Medicine

## 2020-03-14 VITALS — BP 124/74 | HR 83 | Ht 64.0 in | Wt 218.6 lb

## 2020-03-14 DIAGNOSIS — G4733 Obstructive sleep apnea (adult) (pediatric): Secondary | ICD-10-CM | POA: Diagnosis not present

## 2020-03-14 DIAGNOSIS — I480 Paroxysmal atrial fibrillation: Secondary | ICD-10-CM

## 2020-03-14 DIAGNOSIS — I10 Essential (primary) hypertension: Secondary | ICD-10-CM | POA: Diagnosis not present

## 2020-03-14 NOTE — Progress Notes (Signed)
PCP: Antony Contras, MD   Primary EP: Dr Loralie Champagne is a 64 y.o. female who presents today for routine electrophysiology followup.  Since last being seen in our clinic, the patient reports doing very well.  Today, she denies symptoms of palpitations, chest pain, shortness of breath,  lower extremity edema, dizziness, presyncope, or syncope.  The patient is otherwise without complaint today.   Past Medical History:  Diagnosis Date   Allergic rhinitis    Arthritis    "back" (05/07/2016)   Asthma    Atrial flutter ([place])    a. Remotely per notes.   Carpal tunnel syndrome, bilateral    Colon polyps    Diverticulitis    DVT (deep venous thrombosis) ([place]) 1980s x2 -    between birth of two sons. Saw hematology - was told she has a hypercoagulable disorder, should be on Coumadin lifelong.   Dysrhythmia    atrial fib   Family history of adverse reaction to anesthesia    "mother gets PONV"   Gallbladder disease    GERD (gastroesophageal reflux disease)    GIB (gastrointestinal bleeding) 04/14/2018   Halothane adverse reaction    narrow small opening   Heart murmur    Hiatal hernia    History of hiatal hernia    "small one/CTA 05/01/2016" (05/07/2016)   HTN (hypertension)    Hypercholesteremia    hx; "brought it down w/diet" (05/07/2016)   Hyperglycemia    a. A1c 6.1 in 2014.   Inguinal hernia    Iron deficiency anemia due to chronic blood loss 04/14/2018   Iron malabsorption 04/14/2018   Left sided sciatica    Normal cardiac stress test 06/2012   Obesity    OSA (obstructive sleep apnea)    a. Mild, did not tolerate CPAP (05/07/2016)   PAF (paroxysmal atrial fibrillation) ([place])    a. s/p afib ablation at [facility] 2010 (Dr. Annabell Howells). b. Recurrent AF s/p DCCV 06/2010. c. On flecainide.    Paroxysmal atrial fibrillation ([place]) 09/09/2009   Qualifier: Diagnosis of  By: Selena Batten CMA, [place]     PONV (postoperative nausea and vomiting)    PPD  positive, treated 1977   "treated for 1 yr after exposure to patient"   Pre-diabetes    RSV infection ~ 2006   Wandering (atrial) pacemaker    a. Remotely per notes.  (  pt. states has a wandering p wave ) no pacemaker   Past Surgical History:  Procedure Laterality Date   ATRIAL ABLATION SURGERY  05/07/2016   "fib and flutter"   ATRIAL FIBRILLATION ABLATION  2010   at [facility] 05/07/2016   Procedure: Atrial Fibrillation Ablation;  Surgeon: Thompson Grayer, MD;  Location: [place] [facility];  Service: Cardiovascular;  Laterality: N/A;   BREAST CYST ASPIRATION Bilateral    CARDIAC CATHETERIZATION  2008   CARPAL TUNNEL RELEASE Bilateral    CESAREAN SECTION  1986; 1988   COLONOSCOPY  X 2   COLONOSCOPY W/ BIOPSIES AND POLYPECTOMY  X 2   CYSTOSCOPY/URETEROSCOPY/HOLMIUM LASER/STENT PLACEMENT Left 12/23/2019   Procedure: CYSTOSCOPY/URETEROSCOPY/HOLMIUM LASER/STENT PLACEMENT;  Surgeon: Robley Fries, MD;  [facility];  Service: Urology;  Laterality: Left;   ESOPHAGOGASTRODUODENOSCOPY     ESOPHAGOGASTRODUODENOSCOPY (EGD) WITH PROPOFOL N/A 07/19/2017   Procedure: ESOPHAGOGASTRODUODENOSCOPY (EGD) WITH PROPOFOL;  Surgeon: Yetta Flock, MD;  [facility];  Service: Gastroenterology;  Laterality: N/A;   INGUINAL HERNIA REPAIR Right  LAPAROSCOPIC CHOLECYSTECTOMY  1989   POLYPECTOMY N/A 07/19/2017   Procedure: POLYPECTOMY;  Surgeon: Yetta Flock, MD;  [facility];  Service: Gastroenterology;  Laterality: N/A;   PROCTOSCOPY N/A 08/06/2017   Procedure: PROCTOSCOPY;  Surgeon: Michael Boston, MD;  [facility];  Service: General;  Laterality: N/A;   TMJ ARTHROPLASTY     TUBAL LIGATION  1988    ROS- all systems are reviewed and negatives except as per HPI above  Current Outpatient Medications  Medication Sig Dispense Refill   acetaminophen (TYLENOL) 650 MG CR tablet Take 1,300 mg by mouth every  8 (eight) hours as needed for pain.      cetirizine (ZYRTEC) 10 MG tablet Take 10 mg by mouth daily.     Cholecalciferol (VITAMIN D3) 1000 UNITS CAPS Take 2,000 Units by mouth daily.     dicyclomine (BENTYL) 10 MG capsule TAKE 1 CAPSULE BY MOUTH 30 MINS BEFORE MEALS AND AT BEDTIME AS NEEDED FOR PAIN/CRAMPING 360 capsule 1   FLOVENT HFA 220 MCG/ACT inhaler Inhale 1 puff into the lungs 2 (two) times daily.  1   fluticasone (FLONASE) 50 MCG/ACT nasal spray Place 1 spray into both nostrils 2 (two) times daily.      losartan (COZAAR) 25 MG tablet Take 1 tablet (25 mg total) by mouth daily. 90 tablet 0   meclizine (ANTIVERT) 25 MG tablet Take 1 tablet (25 mg total) by mouth 3 (three) times daily as needed for dizziness. 30 tablet 0   Multiple Vitamin (MULTIVITAMIN WITH MINERALS) TABS tablet Take 1 tablet by mouth daily.     omeprazole (PRILOSEC) 40 MG capsule Take 1 capsule (40 mg total) by mouth 2 (two) times daily. Must have an office visit for any further refills. Thank you 180 capsule 0   PROAIR HFA 108 (90 BASE) MCG/ACT inhaler Inhale 2 puffs into the lungs every 6 (six) hours as needed for wheezing or shortness of breath.      venlafaxine XR (EFFEXOR-XR) 37.5 MG 24 hr capsule Take 37.5 mg by mouth daily with breakfast.      verapamil (CALAN) 80 MG tablet Take 160 mg by mouth 2 (two) times daily.     warfarin (COUMADIN) 2 MG tablet TAKE AS DIRECTED BY COUMADIN CLINIC 225 tablet 1   No current facility-administered medications for this visit.    Physical Exam: Vitals:   03/14/20 0947  BP: 124/74  Pulse: 83  SpO2: 94%  Weight: 218 lb 9.6 oz (99.2 kg)  Height: 5\' 4"  (1.626 m)    GEN- The patient is well appearing, alert and oriented x 3 today.   Head- normocephalic, atraumatic Eyes-  Sclera clear, conjunctiva pink Ears- hearing intact Oropharynx- clear Lungs- Clear to ausculation bilaterally, normal work of breathing Heart- Regular rate and rhythm, no murmurs, rubs or  gallops, PMI not laterally displaced GI- soft, NT, ND, + BS Extremities- no clubbing, cyanosis, or edema  Wt Readings from Last 3 Encounters:  03/14/20 218 lb 9.6 oz (99.2 kg)  01/12/20 213 lb 2.6 oz (96.7 kg)  12/22/19 213 lb 12.8 oz (97 kg)    EKG tracing ordered today is personally reviewed and shows sinus  Assessment and Plan:  1. afib Well controlled post ablation off AAD therapy She is on coumadin and does not wish to try DOAC. chads2vasc score is at least 3.  She also had prior DVT and long term anticoagulation has been advised  2 HTN Stable No change required today  3. OSA compliance  with therapy advised   Risks, benefits and potential toxicities for medications prescribed and/or refilled reviewed with patient today.   Return in a year  Thompson Grayer MD, [facility] 03/14/2020 10:28 AM

## 2020-03-14 NOTE — Patient Instructions (Addendum)
Medication Instructions:  Your physician recommends that you continue on your current medications as directed. Please refer to the Current Medication list given to you today.  Labwork: None ordered.  Testing/Procedures: None ordered.  Follow-Up: Your physician wants you to follow-up in: one year with Renee Ursuy PA.  You will receive a reminder letter in the mail two months in advance. If you don't receive a letter, please call our office to schedule the follow-up appointment.  Any Other Special Instructions Will Be Listed Below (If Applicable).  If you need a refill on your cardiac medications before your next appointment, please call your pharmacy.   

## 2020-03-18 DIAGNOSIS — M8580 Other specified disorders of bone density and structure, unspecified site: Secondary | ICD-10-CM | POA: Insufficient documentation

## 2020-03-23 HISTORY — PX: CATARACT EXTRACTION: SUR2

## 2020-03-28 ENCOUNTER — Inpatient Hospital Stay: Payer: Medicare Other

## 2020-03-28 ENCOUNTER — Inpatient Hospital Stay: Payer: Medicare Other | Attending: Family | Admitting: Family

## 2020-04-09 ENCOUNTER — Other Ambulatory Visit: Payer: Self-pay | Admitting: Internal Medicine

## 2020-04-09 DIAGNOSIS — I480 Paroxysmal atrial fibrillation: Secondary | ICD-10-CM

## 2020-04-10 ENCOUNTER — Ambulatory Visit: Payer: Medicare Other | Admitting: Podiatry

## 2020-04-16 ENCOUNTER — Ambulatory Visit (INDEPENDENT_AMBULATORY_CARE_PROVIDER_SITE_OTHER): Payer: Medicare Other | Admitting: *Deleted

## 2020-04-16 ENCOUNTER — Other Ambulatory Visit: Payer: Self-pay

## 2020-04-16 DIAGNOSIS — I4891 Unspecified atrial fibrillation: Secondary | ICD-10-CM

## 2020-04-16 DIAGNOSIS — Z5181 Encounter for therapeutic drug level monitoring: Secondary | ICD-10-CM | POA: Diagnosis not present

## 2020-04-16 DIAGNOSIS — I480 Paroxysmal atrial fibrillation: Secondary | ICD-10-CM | POA: Diagnosis not present

## 2020-04-16 LAB — POCT INR: INR: 2.2 (ref 2.0–3.0)

## 2020-04-16 NOTE — Patient Instructions (Signed)
Description   Continue taking Warfarin 4mg daily except 6mg on Tuesdays, Thursdays, and Saturdays. Recheck INR 6 weeks. Call Coumadin Clinic [phone] Main # [phone] with any questions      

## 2020-04-17 ENCOUNTER — Encounter: Payer: Self-pay | Admitting: Podiatry

## 2020-04-17 ENCOUNTER — Ambulatory Visit (INDEPENDENT_AMBULATORY_CARE_PROVIDER_SITE_OTHER): Payer: Medicare Other | Admitting: Podiatry

## 2020-04-17 DIAGNOSIS — D6859 Other primary thrombophilia: Secondary | ICD-10-CM | POA: Insufficient documentation

## 2020-04-17 DIAGNOSIS — D689 Coagulation defect, unspecified: Secondary | ICD-10-CM

## 2020-04-17 DIAGNOSIS — M79675 Pain in left toe(s): Secondary | ICD-10-CM

## 2020-04-17 DIAGNOSIS — M5430 Sciatica, unspecified side: Secondary | ICD-10-CM | POA: Insufficient documentation

## 2020-04-17 DIAGNOSIS — R209 Unspecified disturbances of skin sensation: Secondary | ICD-10-CM | POA: Insufficient documentation

## 2020-04-17 DIAGNOSIS — J309 Allergic rhinitis, unspecified: Secondary | ICD-10-CM | POA: Insufficient documentation

## 2020-04-17 DIAGNOSIS — M79674 Pain in right toe(s): Secondary | ICD-10-CM | POA: Diagnosis not present

## 2020-04-17 DIAGNOSIS — E1169 Type 2 diabetes mellitus with other specified complication: Secondary | ICD-10-CM | POA: Insufficient documentation

## 2020-04-17 DIAGNOSIS — R609 Edema, unspecified: Secondary | ICD-10-CM | POA: Insufficient documentation

## 2020-04-17 DIAGNOSIS — E782 Mixed hyperlipidemia: Secondary | ICD-10-CM | POA: Insufficient documentation

## 2020-04-17 DIAGNOSIS — E119 Type 2 diabetes mellitus without complications: Secondary | ICD-10-CM | POA: Insufficient documentation

## 2020-04-17 DIAGNOSIS — B351 Tinea unguium: Secondary | ICD-10-CM | POA: Diagnosis not present

## 2020-04-17 DIAGNOSIS — J329 Chronic sinusitis, unspecified: Secondary | ICD-10-CM | POA: Insufficient documentation

## 2020-04-17 NOTE — Progress Notes (Signed)
This patient returns to my office for at risk foot care.  This patient requires this care by a professional since this patient will be at risk due to having coagulation defect.  Patient is taking coumadin.  This patient is unable to cut nails herself since the patient cannot reach her nails.These nails are painful walking and wearing shoes.  This patient presents for at risk foot care today.  General Appearance  Alert, conversant and in no acute stress.  Vascular  Dorsalis pedis and posterior tibial  pulses are palpable  bilaterally.  Capillary return is within normal limits  bilaterally. Temperature is within normal limits  bilaterally.  Neurologic  Senn-Weinstein monofilament wire test within normal limits  bilaterally. Muscle power within normal limits bilaterally.  Nails Thick disfigured discolored nails with subungual debris  Second left and first and third right foot.  No evidence of bacterial infection or drainage bilaterally.  Orthopedic  No limitations of motion  feet .  No crepitus or effusions noted.  No bony pathology or digital deformities noted.  Skin  normotropic skin with no porokeratosis noted bilaterally.  No signs of infections or ulcers noted.     Onychomycosis  Pain in right toes  Pain in left toes  Consent was obtained for treatment procedures.   Mechanical debridement of nails 1-5  bilaterally performed with a nail nipper.  Filed with dremel without incident.    Return office visit   9 weeks                  Told patient to return for periodic foot care and evaluation due to potential at risk complications.   Cornelious Diven DPM  

## 2020-04-30 ENCOUNTER — Telehealth (INDEPENDENT_AMBULATORY_CARE_PROVIDER_SITE_OTHER): Payer: Medicare Other | Admitting: Internal Medicine

## 2020-04-30 ENCOUNTER — Encounter: Payer: Self-pay | Admitting: Internal Medicine

## 2020-04-30 VITALS — BP 128/84 | Wt 219.0 lb

## 2020-04-30 DIAGNOSIS — I4891 Unspecified atrial fibrillation: Secondary | ICD-10-CM | POA: Diagnosis not present

## 2020-04-30 DIAGNOSIS — E785 Hyperlipidemia, unspecified: Secondary | ICD-10-CM

## 2020-04-30 DIAGNOSIS — M791 Myalgia, unspecified site: Secondary | ICD-10-CM | POA: Diagnosis not present

## 2020-04-30 DIAGNOSIS — I10 Essential (primary) hypertension: Secondary | ICD-10-CM

## 2020-04-30 DIAGNOSIS — T466X5A Adverse effect of antihyperlipidemic and antiarteriosclerotic drugs, initial encounter: Secondary | ICD-10-CM

## 2020-04-30 MED ORDER — EZETIMIBE 10 MG PO TABS: 10.0000 mg | ORAL_TABLET | Freq: Every day | ORAL | 3 refills | Status: DC

## 2020-04-30 NOTE — Patient Instructions (Signed)
Medication Instructions:  START zetia 10mg  daily  *If you need a refill on your cardiac medications before your next appointment, please call your pharmacy*   Lab Work: FASTING lipid panel in 3 months -- complete about 1 week before your next visit with Dr. Ulysees Barns locations:  [place] (Dr. Lysbeth Penner office)   - no appointment needed   - 8a-4:30p, closed for lunch ~ 12:45-1:45p - [address] [place] [address] - [address] [place] - [address] ([facility])    If you have labs (blood work) drawn today and your tests are completely normal, you will receive your results only by: Marland Kitchen MyChart Message (if you have MyChart) OR . A paper copy in the mail If you have any lab test that is abnormal or we need to change your treatment, we will call you to review the results.   Testing/Procedures: NONE   Follow-Up: At [facility], you and your health needs are our priority.  As part of our continuing mission to provide you with exceptional heart care, we have created designated Provider Care Teams.  These Care Teams include your primary Cardiologist (physician) and Advanced Practice Providers (APPs -  Physician Assistants and Nurse Practitioners) who all work together to provide you with the care you need, when you need it.  We recommend signing up for the patient portal called "MyChart".  Sign up information is provided on this After Visit Summary.  MyChart is used to connect with patients for Virtual Visits (Telemedicine).  Patients are able to view lab/test results, encounter notes, upcoming appointments, etc.  Non-urgent messages can be sent to your provider as well.   To learn more about what you can do with MyChart, go to  [email or web address].    Your next appointment:   3 month(s) - lipid clinic  The format for your next appointment:   In Person or Virutal  Provider:   K. Mali Hilty, MD   Other Instructions

## 2020-04-30 NOTE — Progress Notes (Signed)
Virtual Visit via Video Note   This visit type was conducted due to national recommendations for restrictions regarding the COVID-19 Pandemic (e.g. social distancing) in an effort to limit this patient's exposure and mitigate transmission in our community.  Due to her co-morbid illnesses, this patient is at least at moderate risk for complications without adequate follow up.  This format is felt to be most appropriate for this patient at this time.  All issues noted in this document were discussed and addressed.  A limited physical exam was performed with this format.  Please refer to the patient's chart for her consent to telehealth for [facility].  Video Connection Lost Video connection was lost at > 50% of the duration of this visit, at which time the remainder of the visit was completed via audio only.     Date:  04/30/2020   ID:  [name], DOB [birth year], MRN [id number] The patient was identified using 2 identifiers.  Evaluation Performed:  New Patient Evaluation  Patient Location:  [address] [place] [address]  Provider location:   [address] [place] [address], [place] [address]  PCP:  Antony Contras, MD  Cardiologist:  Thompson Grayer, MD Electrophysiologist:  None   Chief Complaint:  Manage dyslipidemia  History of Present Illness:    [name] is a 65 y.o. female who presents via audio/video conferencing for a telehealth visit today.  This is a pleasant 65 year old female kindly referred by her PCP for history of dyslipidemia and statin intolerance.  From a cardiac standpoint she had atrial fibrillation and underwent an ablation in 2018 by Dr. Rayann Heman.  She remains on warfarin and was previously on flecainide however that was discontinued.  She has also a history of hypercoagulable disorder and 2 prior DVTs therefore her warfarin is lifelong.  Her mother apparently had a history of heart disease and had stents as well as A. fib.  As part  of her work-up for A. fib ablation she underwent a CT coronary angiogram as well as pulmonary angiography in 2018.  This fortunately showed no evidence of coronary disease with 0 calcium score.  Most recently her lipid profile showed a total cholesterol of 260, triglycerides of 415, HDL 46 and LDL of 138.  She reports recent significant changes in her diet and more exercise however has not had a good impact on her cholesterol.  The patient does not have symptoms concerning for COVID-19 infection (fever, chills, cough, or new SHORTNESS OF BREATH).    Prior CV studies:   The following studies were reviewed today:  Referral records, labwork  PMHx:  Past Medical History:  Diagnosis Date  . Allergic rhinitis   . Arthritis    "back" (05/07/2016)  . Asthma   . Atrial flutter ([place])    a. Remotely per notes.  . Carpal tunnel syndrome, bilateral   . Colon polyps   . Diverticulitis   . DVT (deep venous thrombosis) ([place]) 1980s x2 -    between birth of two sons. Saw hematology - was told she has a hypercoagulable disorder, should be on Coumadin lifelong.  Marland Kitchen Dysrhythmia    atrial fib  . Family history of adverse reaction to anesthesia    "mother gets PONV"  . Gallbladder disease   . GERD (gastroesophageal reflux disease)   . GIB (gastrointestinal bleeding) 04/14/2018  . Halothane adverse reaction    narrow small opening  . Heart murmur   . Hiatal hernia   .  History of hiatal hernia    "small one/CTA 05/01/2016" (05/07/2016)  . HTN (hypertension)   . Hypercholesteremia    hx; "brought it down w/diet" (05/07/2016)  . Hyperglycemia    a. A1c 6.1 in 2014.  . Inguinal hernia   . Iron deficiency anemia due to chronic blood loss 04/14/2018  . Iron malabsorption 04/14/2018  . Left sided sciatica   . Normal cardiac stress test 06/2012  . Obesity   . OSA (obstructive sleep apnea)    a. Mild, did not tolerate CPAP (05/07/2016)  . PAF (paroxysmal atrial fibrillation) ([place])    a. s/p afib ablation at  [facility] 2010 (Dr. Annabell Howells). b. Recurrent AF s/p DCCV 06/2010. c. On flecainide.   . Paroxysmal atrial fibrillation ([place]) 09/09/2009   Qualifier: Diagnosis of  By: Selena Batten CMA, [place]    . PONV (postoperative nausea and vomiting)   . PPD positive, treated 1977   "treated for 1 yr after exposure to patient"  . Pre-diabetes   . RSV infection ~ 2006  . Wandering (atrial) pacemaker    a. Remotely per notes.  (  pt. states has a wandering p wave ) no pacemaker    Past Surgical History:  Procedure Laterality Date  . ATRIAL ABLATION SURGERY  05/07/2016   "fib and flutter"  . ATRIAL FIBRILLATION ABLATION  2010   at [facility]  . ATRIAL FIBRILLATION ABLATION N/A 05/07/2016   Procedure: Atrial Fibrillation Ablation;  Surgeon: Thompson Grayer, MD;  Location: [place] [facility];  Service: Cardiovascular;  Laterality: N/A;  . BREAST CYST ASPIRATION Bilateral   . CARDIAC CATHETERIZATION  2008  . CARPAL TUNNEL RELEASE Bilateral   . CESAREAN SECTION  1986; 1988  . COLONOSCOPY  X 2  . COLONOSCOPY W/ BIOPSIES AND POLYPECTOMY  X 2  . CYSTOSCOPY/URETEROSCOPY/HOLMIUM LASER/STENT PLACEMENT Left 12/23/2019   Procedure: CYSTOSCOPY/URETEROSCOPY/HOLMIUM LASER/STENT PLACEMENT;  Surgeon: Robley Fries, MD;  [facility];  Service: Urology;  Laterality: Left;  . ESOPHAGOGASTRODUODENOSCOPY    . ESOPHAGOGASTRODUODENOSCOPY (EGD) WITH PROPOFOL N/A 07/19/2017   Procedure: ESOPHAGOGASTRODUODENOSCOPY (EGD) WITH PROPOFOL;  Surgeon: Yetta Flock, MD;  [facility];  Service: Gastroenterology;  Laterality: N/A;  . INGUINAL HERNIA REPAIR Right   . LAPAROSCOPIC CHOLECYSTECTOMY  1989  . POLYPECTOMY N/A 07/19/2017   Procedure: POLYPECTOMY;  Surgeon: Yetta Flock, MD;  Location: [facility];  Service: Gastroenterology;  Laterality: N/A;  . PROCTOSCOPY N/A 08/06/2017   Procedure: PROCTOSCOPY;  Surgeon: Michael Boston, MD;  [facility];  Service: General;  Laterality: N/A;  . TMJ  ARTHROPLASTY    . TUBAL LIGATION  1988    FAMHx:  Family History  Problem Relation Age of Onset  . Stroke Mother   . Heart attack Mother        2 previous MIs, 3 stents - CAD first diagnosed 36  . Thyroid disease Mother   . Colon polyps Father   . Lupus Sister   . Heart attack Paternal Grandfather        Died at 13 of massive MI  . Stroke Maternal Grandfather        Multiple family members  . Stroke Paternal Grandmother   . Thyroid disease Sister        x 2  . Colon cancer Neg Hx   . Stomach cancer Neg Hx     SOCHx:   reports that she has never smoked. She has never used smokeless tobacco. She reports previous alcohol use. She reports that  she does not use drugs.  ALLERGIES:  Allergies  Allergen Reactions  . Aspirin Other (See Comments)    Makes asthma worse and makes stomach hurt  . Banana Swelling and Cough    Inside of the mouth swells  . Food Other (See Comments) and Cough    Nuts- mucus membranes inflamed/cough   . Ivp Dye [Iodinated Diagnostic Agents] Anaphylaxis    Required epinephrine. Since then, has tolerated with premed.  . Latex Other (See Comments) and Cough    Wheezing/cough   . Meperidine Hcl Other (See Comments)    Projectile vomiting   . Peanut-Containing Drug Products Swelling and Cough    Inside of the mouth swells  . Penicillins Anaphylaxis    Has patient had a PCN reaction causing immediate rash, facial/tongue/throat swelling, SOB or lightheadedness with hypotension:Yes Has patient had a PCN reaction causing severe rash involving mucus membranes or skin necrosis:Yes Has patient had a PCN reaction that required hospitalization:Treated in ER w/epi & breathing treatments Has patient had a PCN reaction occurring within the last 10 years:No If all of the above answers are "NO", then may proceed with Cephalosporin use.   . Statins Other (See Comments)    Severe leg pain - has tried most of them  . Sulfonamide Derivatives Anaphylaxis  . Tape  Hives and Dermatitis    No plastic tape!!  . Alfuzosin Other (See Comments)    Headache, dizziness  . Sulfamethoxazole Other (See Comments)    MEDS:  Current Meds  Medication Sig  . acetaminophen (TYLENOL) 650 MG CR tablet Take 1,300 mg by mouth every 8 (eight) hours as needed for pain.   . cetirizine (ZYRTEC) 10 MG tablet Take 10 mg by mouth daily.  . Cholecalciferol (VITAMIN D3) 1000 UNITS CAPS Take 2,000 Units by mouth daily.  Marland Kitchen dicyclomine (BENTYL) 10 MG capsule TAKE 1 CAPSULE BY MOUTH 30 MINS BEFORE MEALS AND AT BEDTIME AS NEEDED FOR PAIN/CRAMPING  . FLOVENT HFA 220 MCG/ACT inhaler Inhale 1 puff into the lungs 2 (two) times daily.  . fluticasone (FLONASE) 50 MCG/ACT nasal spray Place 1 spray into both nostrils 2 (two) times daily.   Marland Kitchen losartan (COZAAR) 25 MG tablet Take 1 tablet (25 mg total) by mouth daily.  . meclizine (ANTIVERT) 25 MG tablet Take 1 tablet (25 mg total) by mouth 3 (three) times daily as needed for dizziness.  . Multiple Vitamin (MULTIVITAMIN WITH MINERALS) TABS tablet Take 1 tablet by mouth daily.  Marland Kitchen omeprazole (PRILOSEC) 40 MG capsule Take 1 capsule (40 mg total) by mouth 2 (two) times daily. Must have an office visit for any further refills. Thank you  . PROAIR HFA 108 (90 BASE) MCG/ACT inhaler Inhale 2 puffs into the lungs every 6 (six) hours as needed for wheezing or shortness of breath.   . venlafaxine XR (EFFEXOR-XR) 37.5 MG 24 hr capsule Take 37.5 mg by mouth daily with breakfast.   . verapamil (CALAN) 80 MG tablet Take 160 mg by mouth 2 (two) times daily.  Marland Kitchen warfarin (COUMADIN) 2 MG tablet TAKE AS DIRECTED BY COUMADIN CLINIC     ROS: Pertinent items noted in HPI and remainder of comprehensive ROS otherwise negative.  Labs/Other Tests and Data Reviewed:    Recent Labs: 12/22/2019: ALT 23 01/12/2020: BUN 14; Creatinine, Ser 0.73; Hemoglobin 13.6; Platelets 372; Potassium 3.5; Sodium 139   Recent Lipid Panel Lab Results  Component Value Date/Time    CHOL 238 (H) 01/13/2013 01:39 AM   TRIG 247 (H)  01/13/2013 01:39 AM   HDL 48 01/13/2013 01:39 AM   CHOLHDL 5.0 01/13/2013 01:39 AM   LDLCALC 141 (H) 01/13/2013 01:39 AM    Wt Readings from Last 3 Encounters:  04/30/20 219 lb (99.3 kg)  03/14/20 218 lb 9.6 oz (99.2 kg)  01/12/20 213 lb 2.6 oz (96.7 kg)     Exam:    Vital Signs:  BP 128/84   Wt 219 lb (99.3 kg)   BMI 37.59 kg/m    General appearance: alert and no distress Lungs: no audible wheezing Abdomen: soft, non-tender; bowel sounds normal; no masses,  no organomegaly Extremities: extremities normal, atraumatic, no cyanosis or edema Skin: Skin color, texture, turgor normal. No rashes or lesions Neurologic: Grossly normal Psych: Pleasant  ASSESSMENT & PLAN:    1. Mixed dyslipidemia, goal LDL less than 100 2. Statin intolerance-myalgias 3. Family history of coronary disease in her mother 10. History of atrial fibrillation status post ablation 5. No identifiable coronary artery disease by CT coronary angiography with a 0 calcium score (9244)  [name] has a mixed dyslipidemia with no identifiable coronary disease by CT coronary angiography in 2018 although there is family history of heart disease in her mother.  Unfortunately she has been statin intolerant having had myalgias on numerous statins including pravastatin, atorvastatin, simvastatin and rosuvastatin at multiple doses.  Her most recent lipids demonstrated an LDL of 138 with triglycerides of 415.  I think it is likely she could reach target LDL less than 100 with ezetimibe 10 mg daily.  This should be well-tolerated although she says she has some history of GI issues which could be exacerbated by it.  She will contact us if she has any problems but I would recommend this therapy and plan repeat lipids in 3 months and follow-up at that time.  Thanks again for the kind referral.  COVID-19 Education: The signs and symptoms of COVID-19 were discussed with the patient  and how to seek care for testing (follow up with PCP or arrange E-visit).  The importance of social distancing was discussed today.  Patient Risk:   After full review of this patients clinical status, I feel that they are at least moderate risk at this time.  Time:   Today, I have spent 25 minutes with the patient with telehealth technology discussing dyslipidemia.     Medication Adjustments/Labs and Tests Ordered: Current medicines are reviewed at length with the patient today.  Concerns regarding medicines are outlined above.   Tests Ordered: No orders of the defined types were placed in this encounter.   Medication Changes: No orders of the defined types were placed in this encounter.   Disposition:  in 3 month(s)  Pixie Casino, MD, [facility], [place] Director of the Advanced Lipid Disorders &  Cardiovascular Risk Reduction Clinic Diplomate of the American Board of Clinical Lipidology Attending Cardiologist  Direct Dial: [phone]  Fax: [phone]  Website:  [email or web address]  Pixie Casino, MD  04/30/2020 8:17 AM

## 2020-05-28 ENCOUNTER — Ambulatory Visit (INDEPENDENT_AMBULATORY_CARE_PROVIDER_SITE_OTHER): Payer: Medicare Other

## 2020-05-28 ENCOUNTER — Other Ambulatory Visit: Payer: Self-pay

## 2020-05-28 DIAGNOSIS — Z5181 Encounter for therapeutic drug level monitoring: Secondary | ICD-10-CM | POA: Diagnosis not present

## 2020-05-28 DIAGNOSIS — I480 Paroxysmal atrial fibrillation: Secondary | ICD-10-CM | POA: Diagnosis not present

## 2020-05-28 DIAGNOSIS — I4891 Unspecified atrial fibrillation: Secondary | ICD-10-CM | POA: Diagnosis not present

## 2020-05-28 LAB — POCT INR: INR: 2.7 (ref 2.0–3.0)

## 2020-05-28 NOTE — Patient Instructions (Signed)
- 

## 2020-06-03 ENCOUNTER — Other Ambulatory Visit: Payer: Self-pay

## 2020-06-03 ENCOUNTER — Emergency Department (HOSPITAL_COMMUNITY): Payer: Medicare Other

## 2020-06-03 ENCOUNTER — Emergency Department (HOSPITAL_COMMUNITY)
Admission: EM | Admit: 2020-06-03 | Discharge: 2020-06-03 | Disposition: A | Discharge status: Home / Self Care | Payer: Medicare Other | Attending: Emergency Medicine | Admitting: Emergency Medicine

## 2020-06-03 ENCOUNTER — Encounter (HOSPITAL_COMMUNITY): Payer: Self-pay

## 2020-06-03 ENCOUNTER — Telehealth (HOSPITAL_COMMUNITY): Payer: Self-pay | Admitting: Physician Assistant

## 2020-06-03 DIAGNOSIS — J45909 Unspecified asthma, uncomplicated: Secondary | ICD-10-CM | POA: Insufficient documentation

## 2020-06-03 DIAGNOSIS — S8261XA Displaced fracture of lateral malleolus of right fibula, initial encounter for closed fracture: Secondary | ICD-10-CM | POA: Insufficient documentation

## 2020-06-03 DIAGNOSIS — S82891A Other fracture of right lower leg, initial encounter for closed fracture: Secondary | ICD-10-CM

## 2020-06-03 DIAGNOSIS — R42 Dizziness and giddiness: Secondary | ICD-10-CM | POA: Insufficient documentation

## 2020-06-03 DIAGNOSIS — S99911A Unspecified injury of right ankle, initial encounter: Secondary | ICD-10-CM | POA: Diagnosis present

## 2020-06-03 DIAGNOSIS — Z79899 Other long term (current) drug therapy: Secondary | ICD-10-CM | POA: Diagnosis not present

## 2020-06-03 DIAGNOSIS — S60511A Abrasion of right hand, initial encounter: Secondary | ICD-10-CM | POA: Diagnosis not present

## 2020-06-03 DIAGNOSIS — Z7952 Long term (current) use of systemic steroids: Secondary | ICD-10-CM | POA: Diagnosis not present

## 2020-06-03 DIAGNOSIS — Z7901 Long term (current) use of anticoagulants: Secondary | ICD-10-CM | POA: Insufficient documentation

## 2020-06-03 DIAGNOSIS — Z91010 Allergy to peanuts: Secondary | ICD-10-CM | POA: Insufficient documentation

## 2020-06-03 DIAGNOSIS — R001 Bradycardia, unspecified: Secondary | ICD-10-CM | POA: Insufficient documentation

## 2020-06-03 DIAGNOSIS — E119 Type 2 diabetes mellitus without complications: Secondary | ICD-10-CM | POA: Diagnosis not present

## 2020-06-03 DIAGNOSIS — Z91040 Latex allergy status: Secondary | ICD-10-CM | POA: Insufficient documentation

## 2020-06-03 DIAGNOSIS — Z23 Encounter for immunization: Secondary | ICD-10-CM | POA: Diagnosis not present

## 2020-06-03 DIAGNOSIS — Z950 Presence of cardiac pacemaker: Secondary | ICD-10-CM | POA: Insufficient documentation

## 2020-06-03 DIAGNOSIS — W19XXXA Unspecified fall, initial encounter: Secondary | ICD-10-CM | POA: Insufficient documentation

## 2020-06-03 DIAGNOSIS — I10 Essential (primary) hypertension: Secondary | ICD-10-CM | POA: Diagnosis not present

## 2020-06-03 LAB — CBC WITH DIFFERENTIAL/PLATELET
Abs Immature Granulocytes: 0.04 10*3/uL (ref 0.00–0.07)
Basophils Absolute: 0.1 10*3/uL (ref 0.0–0.1)
Basophils Relative: 1 %
Eosinophils Absolute: 0.1 10*3/uL (ref 0.0–0.5)
Eosinophils Relative: 1 %
HCT: 44.7 % (ref 36.0–46.0)
Hemoglobin: 14.2 g/dL (ref 12.0–15.0)
Immature Granulocytes: 0 %
Lymphocytes Relative: 18 %
Lymphs Abs: 2 10*3/uL (ref 0.7–4.0)
MCH: 30.5 pg (ref 26.0–34.0)
MCHC: 31.8 g/dL (ref 30.0–36.0)
MCV: 96.1 fL (ref 80.0–100.0)
Monocytes Absolute: 0.7 10*3/uL (ref 0.1–1.0)
Monocytes Relative: 6 %
Neutro Abs: 8.4 10*3/uL — ABNORMAL HIGH (ref 1.7–7.7)
Neutrophils Relative %: 74 %
Platelets: 326 10*3/uL (ref 150–400)
RBC: 4.65 MIL/uL (ref 3.87–5.11)
RDW: 13.3 % (ref 11.5–15.5)
WBC: 11.2 10*3/uL — ABNORMAL HIGH (ref 4.0–10.5)
nRBC: 0 % (ref 0.0–0.2)

## 2020-06-03 LAB — COMPREHENSIVE METABOLIC PANEL
ALT: 22 U/L (ref 0–44)
AST: 24 U/L (ref 15–41)
Albumin: 4 g/dL (ref 3.5–5.0)
Alkaline Phosphatase: 65 U/L (ref 38–126)
Anion gap: 12 (ref 5–15)
BUN: 13 mg/dL (ref 8–23)
CO2: 23 mmol/L (ref 22–32)
Calcium: 9.1 mg/dL (ref 8.9–10.3)
Chloride: 103 mmol/L (ref 98–111)
Creatinine, Ser: 0.62 mg/dL (ref 0.44–1.00)
GFR, Estimated: >60 mL/min (ref >60)
Glucose, Bld: 104 mg/dL — ABNORMAL HIGH (ref 70–99)
Potassium: 3.3 mmol/L — ABNORMAL LOW (ref 3.5–5.1)
Sodium: 138 mmol/L (ref 135–145)
Total Bilirubin: 0.5 mg/dL (ref 0.3–1.2)
Total Protein: 7.5 g/dL (ref 6.5–8.1)

## 2020-06-03 LAB — PROTIME-INR
INR: 2.5 — ABNORMAL HIGH (ref 0.8–1.2)
Prothrombin Time: 25.9 seconds — ABNORMAL HIGH (ref 11.4–15.2)

## 2020-06-03 LAB — TROPONIN I (HIGH SENSITIVITY)
Troponin I (High Sensitivity): 2 ng/L (ref <18)
Troponin I (High Sensitivity): 2 ng/L (ref <18)

## 2020-06-03 MED ORDER — SODIUM CHLORIDE 0.9 % IV BOLUS
1000.0000 mL | Freq: Once | INTRAVENOUS | Status: AC
  Administered 2020-06-03: 1000 mL via INTRAVENOUS

## 2020-06-03 MED ORDER — HYDROCODONE-ACETAMINOPHEN 5-325 MG PO TABS
1.0000 | ORAL_TABLET | Freq: Once | ORAL | Status: AC
  Administered 2020-06-03: 1 via ORAL
  Filled 2020-06-03: qty 1

## 2020-06-03 MED ORDER — TETANUS-DIPHTH-ACELL PERTUSSIS 5-2.5-18.5 LF-MCG/0.5 IM SUSY
0.5000 mL | PREFILLED_SYRINGE | Freq: Once | INTRAMUSCULAR | Status: AC
  Administered 2020-06-03: 0.5 mL via INTRAMUSCULAR
  Filled 2020-06-03: qty 0.5

## 2020-06-03 MED ORDER — HYDROCODONE-ACETAMINOPHEN 5-325 MG PO TABS: 1.0000 | ORAL_TABLET | ORAL | 0 refills | Status: DC | PRN

## 2020-06-03 MED ORDER — HYDROCODONE-ACETAMINOPHEN 5-325 MG PO TABS: 2.0000 | ORAL_TABLET | ORAL | 0 refills | Status: DC | PRN

## 2020-06-03 MED ORDER — MORPHINE SULFATE (PF) 4 MG/ML IV SOLN
4.0000 mg | Freq: Once | INTRAVENOUS | Status: AC
  Administered 2020-06-03: 4 mg via INTRAVENOUS
  Filled 2020-06-03: qty 1

## 2020-06-03 NOTE — ED Triage Notes (Signed)
Pt brought to ED via [facility] for fall. Pt denies LOC or hitting head. Pt c/o right ankle pain, left knee pain, bilateral hand pain. Pt states she was dizzy before fall. Per EMS, pt was in a fib on arrival but converted.

## 2020-06-03 NOTE — Discharge Instructions (Addendum)
Sure to ice and elevate your extremity.  Follow-up with orthopedics for reevaluation 1 week.  He did have episodes here your heart rate went into the high 40s, low 50s.  I would suggest following up with cardiology.  Your lab work was reassuring.  Head CT did not show any evidence of stroke or bleeding  I have sent in a short course of pain medicine.  This medication does contain Tylenol.  Do not take additional Tylenol while taking this medication.  Return for any worsening symptoms.

## 2020-06-03 NOTE — ED Provider Notes (Signed)
[facility] EMERGENCY DEPARTMENT Provider Note   CSN: [id number] Arrival date & time: 06/03/20  1321     History Chief Complaint  Patient presents with  . Fall    [name] is a 65 y.o. female with history significant for DVT, A. fib on anticoagulation who presents for evaluation of fall versus syncope.  Was out running her errands.  Became dizzy which caused her to fall.  She inverted her right ankle and fell on both of her hands.  She is unsure if she had a syncopal event.  Patient states when they went to set her up patient became nauseous, diaphoretic and had to be laid back down.  Per bystanders who was a nurse in the EP lab patient had a pulse of 130 which felt irregular.  Has history of atrial fibrillation.  She is s/p ablation.  She is on Coumadin.  Last levels checked last week which was 2.7.  She denies any current symptoms.  She does not think she hit her head.  She states she was sick approximately 2 weeks ago with a sinus infection however those symptoms have resolved.  She denies any headache, lightheadedness, dizziness, blurred vision, neck pain, neck stiffness, chest pain, shortness of breath, hemoptysis abdominal pain, diarrhea, dysuria, unilateral leg swelling, redness or warmth.  Does admit to right ankle pain where there is some overlying swelling.  No paresthesias or weakness. Denies additional aggravating or alleviating factors.  History obtained from patient and past medical records.  No interpreter used.  HPI     Past Medical History:  Diagnosis Date  . Allergic rhinitis   . Arthritis    "back" (05/07/2016)  . Asthma   . Atrial flutter ([place])    a. Remotely per notes.  . Carpal tunnel syndrome, bilateral   . Colon polyps   . Diverticulitis   . DVT (deep venous thrombosis) ([place]) 1980s x2 -    between birth of two sons. Saw hematology - was told she has a hypercoagulable disorder, should be on Coumadin lifelong.  Marland Kitchen Dysrhythmia    atrial fib  . Family  history of adverse reaction to anesthesia    "mother gets PONV"  . Gallbladder disease   . GERD (gastroesophageal reflux disease)   . GIB (gastrointestinal bleeding) 04/14/2018  . Halothane adverse reaction    narrow small opening  . Heart murmur   . Hiatal hernia   . History of hiatal hernia    "small one/CTA 05/01/2016" (05/07/2016)  . HTN (hypertension)   . Hypercholesteremia    hx; "brought it down w/diet" (05/07/2016)  . Hyperglycemia    a. A1c 6.1 in 2014.  . Inguinal hernia   . Iron deficiency anemia due to chronic blood loss 04/14/2018  . Iron malabsorption 04/14/2018  . Left sided sciatica   . Normal cardiac stress test 06/2012  . Obesity   . OSA (obstructive sleep apnea)    a. Mild, did not tolerate CPAP (05/07/2016)  . PAF (paroxysmal atrial fibrillation) ([place])    a. s/p afib ablation at [facility] 2010 (Dr. Annabell Howells). b. Recurrent AF s/p DCCV 06/2010. c. On flecainide.   . Paroxysmal atrial fibrillation ([place]) 09/09/2009   Qualifier: Diagnosis of  By: Selena Batten CMA, [place]    . PONV (postoperative nausea and vomiting)   . PPD positive, treated 1977   "treated for 1 yr after exposure to patient"  . Pre-diabetes   . RSV infection ~ 2006  . Wandering (atrial) pacemaker  a. Remotely per notes.  (  pt. states has a wandering p wave ) no pacemaker    Patient Active Problem List   Diagnosis Date Noted  . Allergic rhinitis 04/17/2020  . Chronic sinusitis 04/17/2020  . Edema 04/17/2020  . Mixed hyperlipidemia 04/17/2020  . Morbid obesity ([place]) 04/17/2020  . Sciatica 04/17/2020  . Skin sensation disturbance 04/17/2020  . Thrombophilia ([place]) 04/17/2020  . Type 2 diabetes mellitus with other specified complication ([place]) 03/47/4259  . Abdominal pain, left lower quadrant 10/27/2018  . Loose stools 10/27/2018  . Iron deficiency anemia due to chronic blood loss 04/14/2018  . Iron malabsorption 04/14/2018  . GIB (gastrointestinal bleeding) 04/14/2018  . Chronic anticoagulation  (warfarin for Afib) 08/06/2017  . Gastric polyp   . Fall 07/08/2017  . Scalp laceration 07/08/2017  . Hypokalemia 07/08/2017  . Numbness of hand 06/07/2017  . Recurrent diverticulitis s/p robotic sigmoid colectomy 08/06/2017 11/24/2016  . Right hip pain 05/14/2015  . Obesity (BMI 30-39.9) 01/10/2014  . Sleep apnea 01/10/2014  . Encounter for therapeutic drug monitoring 04/26/2013  . Essential hypertension 03/01/2013  . History of DVT (deep vein thrombosis) 01/12/2013  . Hyperglycemia 01/12/2013  . Dizziness 07/09/2010  . HYPERCHOLESTEROLEMIA 09/10/2009  . Carpal tunnel syndrome 09/10/2009  . Asthma 09/10/2009  . TMJ SYNDROME 09/10/2009  . GERD 09/10/2009  . GALLBLADDER DISEASE 09/10/2009  . Paroxysmal atrial fibrillation ([place]) 09/09/2009    Past Surgical History:  Procedure Laterality Date  . ATRIAL ABLATION SURGERY  05/07/2016   "fib and flutter"  . ATRIAL FIBRILLATION ABLATION  2010   at [facility]  . ATRIAL FIBRILLATION ABLATION N/A 05/07/2016   Procedure: Atrial Fibrillation Ablation;  Surgeon: Thompson Grayer, MD;  Location: [facility];  Service: Cardiovascular;  Laterality: N/A;  . BREAST CYST ASPIRATION Bilateral   . CARDIAC CATHETERIZATION  2008  . CARPAL TUNNEL RELEASE Bilateral   . CESAREAN SECTION  1986; 1988  . COLONOSCOPY  X 2  . COLONOSCOPY W/ BIOPSIES AND POLYPECTOMY  X 2  . CYSTOSCOPY/URETEROSCOPY/HOLMIUM LASER/STENT PLACEMENT Left 12/23/2019   Procedure: CYSTOSCOPY/URETEROSCOPY/HOLMIUM LASER/STENT PLACEMENT;  Surgeon: Robley Fries, MD;  Location: [facility];  Service: Urology;  Laterality: Left;  . ESOPHAGOGASTRODUODENOSCOPY    . ESOPHAGOGASTRODUODENOSCOPY (EGD) WITH PROPOFOL N/A 07/19/2017   Procedure: ESOPHAGOGASTRODUODENOSCOPY (EGD) WITH PROPOFOL;  Surgeon: Yetta Flock, MD;  Location: [facility];  Service: Gastroenterology;  Laterality: N/A;  . INGUINAL HERNIA REPAIR Right   . LAPAROSCOPIC CHOLECYSTECTOMY  1989  . POLYPECTOMY N/A  07/19/2017   Procedure: POLYPECTOMY;  Surgeon: Yetta Flock, MD;  Location: [facility];  Service: Gastroenterology;  Laterality: N/A;  . PROCTOSCOPY N/A 08/06/2017   Procedure: PROCTOSCOPY;  Surgeon: Michael Boston, MD;  Location: [facility];  Service: General;  Laterality: N/A;  . TMJ ARTHROPLASTY    . TUBAL LIGATION  1988     OB History   No obstetric history on file.     Family History  Problem Relation Age of Onset  . Stroke Mother   . Heart attack Mother        2 previous MIs, 3 stents - CAD first diagnosed 3  . Thyroid disease Mother   . Colon polyps Father   . Lupus Sister   . Heart attack Paternal Grandfather        Died at 21 of massive MI  . Stroke Maternal Grandfather        Multiple family members  . Stroke Paternal Grandmother   . Thyroid  disease Sister        x 2  . Colon cancer Neg Hx   . Stomach cancer Neg Hx     Social History   Tobacco Use  . Smoking status: Never Smoker  . Smokeless tobacco: Never Used  Vaping Use  . Vaping Use: Never used  Substance Use Topics  . Alcohol use: Not Currently    Comment: 07-13-17  . Drug use: No    Home Medications Prior to Admission medications   Medication Sig Start Date End Date Taking? Authorizing Provider  acetaminophen (TYLENOL) 650 MG CR tablet Take 1,300 mg by mouth every 8 (eight) hours as needed for pain.    Yes [provider]  cetirizine (ZYRTEC) 10 MG tablet Take 10 mg by mouth daily.   Yes [provider]  Cholecalciferol (VITAMIN D3) 1000 UNITS CAPS Take 2,000 Units by mouth daily.   Yes [provider]  dicyclomine (BENTYL) 10 MG capsule TAKE 1 CAPSULE BY MOUTH 30 MINS BEFORE MEALS AND AT BEDTIME AS NEEDED FOR PAIN/CRAMPING Patient taking differently: Take 10 mg by mouth 3 (three) times daily with meals as needed (pain/cramping). 12/23/18  Yes Zehr, Laban Emperor, PA-C  ezetimibe (ZETIA) 10 MG tablet Take 1 tablet (10 mg total) by mouth daily. 04/30/20 07/29/20 Yes Hilty,  Nadean Corwin, MD  FLOVENT HFA 220 MCG/ACT inhaler Inhale 1 puff into the lungs 2 (two) times daily. 04/20/16  Yes [provider]  fluticasone (FLONASE) 50 MCG/ACT nasal spray Place 1 spray into both nostrils 2 (two) times daily.  06/02/12  Yes [provider]  HYDROcodone-acetaminophen (NORCO/VICODIN) 5-325 MG tablet Take 1 tablet by mouth every 4 (four) hours as needed. 06/03/20  Yes Laronda Lisby A, PA-C  losartan (COZAAR) 25 MG tablet Take 1 tablet (25 mg total) by mouth daily. 12/20/13  Yes Allred, Jeneen Rinks, MD  Multiple Vitamin (MULTIVITAMIN WITH MINERALS) TABS tablet Take 1 tablet by mouth daily.   Yes [provider]  omeprazole (PRILOSEC) 40 MG capsule Take 1 capsule (40 mg total) by mouth 2 (two) times daily. Must have an office visit for any further refills. Thank you 05/03/19  Yes Armbruster, Carlota Raspberry, MD  PROAIR HFA 108 (90 BASE) MCG/ACT inhaler Inhale 2 puffs into the lungs every 6 (six) hours as needed for wheezing or shortness of breath.  03/22/12  Yes [provider]  venlafaxine XR (EFFEXOR-XR) 37.5 MG 24 hr capsule Take 37.5 mg by mouth daily with breakfast.  11/21/12  Yes [provider]  verapamil (CALAN) 80 MG tablet Take 160 mg by mouth 2 (two) times daily.   Yes [provider]  warfarin (COUMADIN) 2 MG tablet TAKE AS DIRECTED BY COUMADIN CLINIC Patient taking differently: Take 4-6 mg by mouth See admin instructions. 4mg  mon, wed, fri, and sun; 6 mg tues, thur, and sat 04/09/20  Yes Allred, Jeneen Rinks, MD  meclizine (ANTIVERT) 25 MG tablet Take 1 tablet (25 mg total) by mouth 3 (three) times daily as needed for dizziness. Patient not taking: Reported on 06/03/2020 01/19/18   Dorie Rank, MD    Allergies    Aspirin, Banana, Food, Ivp dye [iodinated diagnostic agents], Latex, Meperidine hcl, Peanut-containing drug products, Penicillins, Statins, Sulfonamide derivatives, Tape, Alfuzosin, and Sulfamethoxazole  Review of Systems   Review of  Systems  Constitutional: Negative.   HENT: Negative.   Respiratory: Negative.   Cardiovascular: Negative.   Gastrointestinal: Negative.   Genitourinary: Negative.   Musculoskeletal:       Right  ankle pain  Skin: Negative.   Neurological: Positive for dizziness and light-headedness. Negative for seizures, facial asymmetry, speech difficulty and headaches. Syncope: Patient unclear if syncopal event.  All other systems reviewed and are negative.   Physical Exam Updated Vital Signs BP (!) 152/78   Pulse (!) 58   Temp (!) 97.3 F (36.3 C) (Oral)   Resp 13   Ht 5\' 4"  (1.626 m)   Wt 99.3 kg   SpO2 95%   BMI 37.59 kg/m   Physical Exam Vitals and nursing note reviewed.  Constitutional:      General: She is not in acute distress.    Appearance: She is well-developed. She is not ill-appearing, toxic-appearing or diaphoretic.  HENT:     Head: Normocephalic and atraumatic.     Nose: Nose normal.     Mouth/Throat:     Mouth: Mucous membranes are moist.  Eyes:     Pupils: Pupils are equal, round, and reactive to light.  Cardiovascular:     Rate and Rhythm: Normal rate.     Pulses: Normal pulses.          Dorsalis pedis pulses are 2+ on the right side and 2+ on the left side.     Heart sounds: Normal heart sounds.  Pulmonary:     Effort: Pulmonary effort is normal. No respiratory distress.     Breath sounds: Normal breath sounds.     Comments: Clear to auscultation bilaterally.  Speaks in full sentences without difficulty. Abdominal:     General: Bowel sounds are normal. There is no distension.     Palpations: Abdomen is soft.     Tenderness: There is no abdominal tenderness. There is no guarding or rebound.     Comments: Soft, nontender.  Normoactive bowel sounds  Musculoskeletal:        General: Normal range of motion.       Hands:     Cervical back: Normal range of motion.     Right hip: Normal.     Left hip: Normal.     Right upper leg: Normal.     Left upper leg:  Normal.     Right knee: Normal.     Left knee: Normal.     Right lower leg: Normal.     Left lower leg: Normal.     Right ankle: Tenderness present over the lateral malleolus.     Left ankle: Normal.     Right foot: Normal.     Left foot: Normal.       Legs:       Feet:     Comments: Moves all 4 extremities without difficulty.  No bony tenderness to wrist or hands.  No bony tenderness of bilateral knees.  There is some bony tenderness to right lateral malleolus.  Does not extend into the foot, midshaft, proximal tib-fib.  Will do straight leg raise bilaterally.  Pelvis stable, nontender palpation.  Negative anterior drawer.  No shortening or rotation of legs  Skin:    General: Skin is warm and dry.     Capillary Refill: Capillary refill takes less than 2 seconds.     Findings: Abrasion present.     Comments: Soft tissue swelling to right lateral malleolus.  Abrasion to right palmar aspect hand.  No active bleeding or drainage.  No underlying bony tenderness peer  Neurological:     General: No focal deficit present.     Mental Status: She is alert.  Cranial Nerves: Cranial nerves are intact.     Sensory: Sensation is intact.     Motor: Motor function is intact.     Comments: Cranial nerves II to grossly intact Intact sensation Equal handgrip     ED Results / Procedures / Treatments   Labs (all labs ordered are listed, but only abnormal results are displayed) Labs Reviewed  CBC WITH DIFFERENTIAL/PLATELET - Abnormal; Notable for the following components:      Result Value   WBC 11.2 (*)    Neutro Abs 8.4 (*)    All other components within normal limits  COMPREHENSIVE METABOLIC PANEL - Abnormal; Notable for the following components:   Potassium 3.3 (*)    Glucose, Bld 104 (*)    All other components within normal limits  PROTIME-INR - Abnormal; Notable for the following components:   Prothrombin Time 25.9 (*)    INR 2.5 (*)    All other components within normal limits   TROPONIN I (HIGH SENSITIVITY)  TROPONIN I (HIGH SENSITIVITY)    EKG EKG Interpretation  Date/Time:  Monday June 03 2020 13:32:34 EDT Ventricular Rate:  48 PR Interval:    QRS Duration: 95 QT Interval:  463 QTC Calculation: 414 R Axis:   161 Text Interpretation: Sinus bradycardia Right axis deviation Low voltage, precordial leads Abnormal T, consider ischemia, lateral leads Confirmed by Fredia Sorrow [phone]) on 06/03/2020 2:01:00 PM   Radiology DG Chest 2 View  Result Date: 06/03/2020 CLINICAL DATA:  Dizziness with fall today. EXAM: CHEST - 2 VIEW COMPARISON:  Chest radiograph June 26, 2017 FINDINGS: Enlarged cardiac silhouette, likely accentuated by technique. No pleural effusion. No pneumothorax. Low lung volumes with bibasilar subsegmental atelectasis. Thoracic spondylosis. IMPRESSION: Low lung volumes with bibasilar subsegmental atelectasis. No focal consolidation or overt pulmonary edema. Electronically Signed   By: Dahlia Bailiff MD   On: 06/03/2020 15:16   DG Ankle Complete Right  Result Date: 06/03/2020 CLINICAL DATA:  Ankle pain after fall today EXAM: RIGHT ANKLE - COMPLETE 3+ VIEW COMPARISON:  None. FINDINGS: Avulsion type fracture of the distal aspect of the lateral epicondyle. The ankle mortise appears intact on these nonweightbearing views. Soft tissue swelling overlying the lateral epicondyle. IMPRESSION: Avulsion type fracture of the distal aspect of the lateral epicondyle with overlying soft tissue swelling. Electronically Signed   By: Dahlia Bailiff MD   On: 06/03/2020 15:18   CT Head Wo Contrast  Result Date: 06/03/2020 CLINICAL DATA:  Fall EXAM: CT HEAD WITHOUT CONTRAST TECHNIQUE: Contiguous axial images were obtained from the base of the skull through the vertex without intravenous contrast. COMPARISON:  2019 FINDINGS: Brain: There is no acute intracranial hemorrhage, mass effect, or edema. Gray-white differentiation is preserved. There is no extra-axial fluid  collection. Ventricles and sulci are within normal limits in size and configuration. Vascular: No hyperdense vessel.There is mild atherosclerotic calcification at the skull base. Skull: Calvarium is unremarkable. Sinuses/Orbits: No acute finding. Other: None. IMPRESSION: No evidence of acute intracranial injury. Electronically Signed   By: Macy Mis M.D.   On: 06/03/2020 15:21    Procedures .Splint Application  Date/Time: 06/03/2020 6:18 PM Performed by: Nettie Elm, PA-C Authorized by: Nettie Elm, PA-C   Consent:    Consent obtained:  Verbal   Consent given by:  Patient   Risks, benefits, and alternatives were discussed: yes     Risks discussed:  Numbness, discoloration, pain and swelling   Alternatives discussed:  Referral, observation, alternative treatment, delayed treatment and no  treatment Universal protocol:    Procedure explained and questions answered to patient or proxy's satisfaction: yes     Relevant documents present and verified: yes     Test results available: yes     Imaging studies available: yes     Required blood products, implants, devices, and special equipment available: yes     Site/side marked: yes     Immediately prior to procedure a time out was called: yes     Patient identity confirmed:  Verbally with patient Pre-procedure details:    Distal neurologic exam:  Normal   Distal perfusion: distal pulses strong and brisk capillary refill   Procedure details:    Location:  Ankle   Ankle location:  R ankle   Strapping: no     Cast type:  Short leg walking   Supplies:  Prefabricated splint   Attestation: Splint applied and adjusted personally by me   Post-procedure details:    Distal neurologic exam:  Normal   Distal perfusion: distal pulses strong and brisk capillary refill     Procedure completion:  Tolerated well, no immediate complications   Post-procedure imaging: not applicable       Medications Ordered in ED Medications   sodium chloride 0.9 % bolus 1,000 mL (0 mLs Intravenous Stopped 06/03/20 1521)  Tdap (BOOSTRIX) injection 0.5 mL (0.5 mLs Intramuscular Given 06/03/20 1359)  HYDROcodone-acetaminophen (NORCO/VICODIN) 5-325 MG per tablet 1 tablet (1 tablet Oral Given 06/03/20 1359)  morphine 4 MG/ML injection 4 mg (4 mg Intravenous Given 06/03/20 1607)    ED Course  I have reviewed the triage vital signs and the nursing notes.  Pertinent labs & imaging results that were available during my care of the patient were reviewed by me and considered in my medical decision making (see chart for details).  65 year old here for evaluation of fall versus syncope.  Occurred just PTA.  Felt lightheaded and dizzy.  She is unsure if she hit her head.  She is on chronic anticoagulation, Coumadin for A. Fib.  She is compliant her anticoagulation.  She denies any sudden onset thunderclap headache, blurred vision, chest pain, shortness of breath.  No clinical evidence of DVT on exam.  Apparently after she fell a bystander who works in the medical field pain her pulse which was 130s and irregular.  Per EMS patient was in A. fib with RVR when they arrived however converted without intervention.  She arrives with complaints of right ankle pain which she has some tenderness to right lateral malleolus.  Has a nonfocal neuro exam without deficits.  Her heart and lungs are clear.  Abdomen soft, nontender.  We will plan on labs, imaging and reassess.  She has a history of DVT however she is anticoagulated on Coumadin.  She has no tachycardia, tachypnea, hypoxia, shortness of breath or clinical evidence of DVTs to suggest PE.  I have low suspicion for this.  Labs and imaging personally reviewed and interpreted:  CBC leukocytosis 11.2, no infectious symptoms CMP mild hypokalemia 3.3, no additional electrolyte, renal or liver abnormality INR 2.5, consistent with Coumadin Troponin 2>>2 CT head without acute finding DG chest without acute  finding Dg right ankle small right lateral avulsion fracture with some soft tissue swelling EKG with bradycardia, No STEMI  Patient reassessed.  Pain controlled.  Has had some bradycardia episodes however patient seems to be asymptomatic during these episodes. Patient probably felt lightheaded earlier due to A. fib with RVR when she was with EMS.  This  has resolved.  Placed in cam walker.  Patient has walker at home.  She was able to ambulate in ED without difficulty.  No orthostatic changes.  Patient follow-up with orthopedics for her right malleolus fracture.  Also had follow-up with cardiology given her intermittent bradycardia here in the ED, no evidence of heart block on EKG  Patient without history of congestive heart failure, normal hematocrit, normal ECG, no shortness of breath and systolic blood pressure greater than 90; patient is low risk. Will plan for discharge home with close cardiology/PCP follow-up.  Possibility of recurrent syncope has been discussed. I discussed reasons to avoid driving until cardiology/PCP followup and other safety prevention including use of ladders and working at heights.   The patient has been appropriately medically screened and/or stabilized in the ED. I have low suspicion for any other emergent medical condition which would require further screening, evaluation or treatment in the ED or require inpatient management.  Patient is hemodynamically stable and in no acute distress.  Patient able to ambulate in department prior to ED.  Evaluation does not show acute pathology that would require ongoing or additional emergent interventions while in the emergency department or further inpatient treatment.  I have discussed the diagnosis with the patient and answered all questions.  Pain is been managed while in the emergency department and patient has no further complaints prior to discharge.  Patient is comfortable with plan discussed in room and is stable for  discharge at this time.  I have discussed strict return precautions for returning to the emergency department.  Patient was encouraged to follow-up with PCP/specialist refer to at discharge.    MDM Rules/Calculators/A&P                           Final Clinical Impression(s) / ED Diagnoses Final diagnoses:  Fall, initial encounter  Closed fracture of malleolus of right ankle, initial encounter    Rx / DC Orders ED Discharge Orders         Ordered    HYDROcodone-acetaminophen (NORCO/VICODIN) 5-325 MG tablet  Every 4 hours PRN        06/03/20 1817           Maahir Horst A, PA-C 06/03/20 1819    Fredia Sorrow, MD 06/11/20 [phone]

## 2020-06-03 NOTE — Telephone Encounter (Signed)
[facility] was out of Norco. Resent in Rx to [facility] of patients choice

## 2020-06-21 ENCOUNTER — Ambulatory Visit (INDEPENDENT_AMBULATORY_CARE_PROVIDER_SITE_OTHER): Payer: Medicare Other | Admitting: Podiatry

## 2020-06-21 ENCOUNTER — Encounter: Payer: Self-pay | Admitting: Podiatry

## 2020-06-21 ENCOUNTER — Other Ambulatory Visit: Payer: Self-pay

## 2020-06-21 DIAGNOSIS — M79674 Pain in right toe(s): Secondary | ICD-10-CM

## 2020-06-21 DIAGNOSIS — D689 Coagulation defect, unspecified: Secondary | ICD-10-CM

## 2020-06-21 DIAGNOSIS — B351 Tinea unguium: Secondary | ICD-10-CM

## 2020-06-21 DIAGNOSIS — M79675 Pain in left toe(s): Secondary | ICD-10-CM | POA: Diagnosis not present

## 2020-06-21 NOTE — Progress Notes (Signed)
This patient returns to my office for at risk foot care.  This patient requires this care by a professional since this patient will be at risk due to having coagulation defect.  Patient is taking coumadin.  This patient is unable to cut nails herself since the patient cannot reach her nails.These nails are painful walking and wearing shoes.  This patient presents for at risk foot care today.  General Appearance  Alert, conversant and in no acute stress.  Vascular  Dorsalis pedis and posterior tibial  pulses are palpable  bilaterally.  Capillary return is within normal limits  bilaterally. Temperature is within normal limits  bilaterally.  Neurologic  Senn-Weinstein monofilament wire test within normal limits  bilaterally. Muscle power within normal limits bilaterally.  Nails Thick disfigured discolored nails with subungual debris  Second left and first and third right foot.  No evidence of bacterial infection or drainage bilaterally.  Orthopedic  No limitations of motion  feet .  No crepitus or effusions noted.  No bony pathology or digital deformities noted.  Skin  normotropic skin with no porokeratosis noted bilaterally.  No signs of infections or ulcers noted.     Onychomycosis  Pain in right toes  Pain in left toes  Consent was obtained for treatment procedures.   Mechanical debridement of nails 1-5  bilaterally performed with a nail nipper.  Filed with dremel without incident.    Return office visit   9 weeks                  Told patient to return for periodic foot care and evaluation due to potential at risk complications.   Almyra Birman DPM  

## 2020-07-09 ENCOUNTER — Other Ambulatory Visit: Payer: Self-pay

## 2020-07-09 ENCOUNTER — Ambulatory Visit (INDEPENDENT_AMBULATORY_CARE_PROVIDER_SITE_OTHER): Payer: Medicare Other | Admitting: Pharmacist

## 2020-07-09 DIAGNOSIS — I4891 Unspecified atrial fibrillation: Secondary | ICD-10-CM | POA: Diagnosis not present

## 2020-07-09 DIAGNOSIS — I480 Paroxysmal atrial fibrillation: Secondary | ICD-10-CM | POA: Diagnosis not present

## 2020-07-09 DIAGNOSIS — Z5181 Encounter for therapeutic drug level monitoring: Secondary | ICD-10-CM | POA: Diagnosis not present

## 2020-07-09 LAB — POCT INR: INR: 2.9 (ref 2.0–3.0)

## 2020-07-09 NOTE — Patient Instructions (Signed)
Description   - Continue taking Warfarin 4mg  daily except 6mg  on Tuesdays, Thursdays, and Saturdays.  -Recheck INR 6 weeks.   Call Coumadin Clinic [phone] Main # [phone] with any questions

## 2020-07-18 ENCOUNTER — Encounter: Payer: Self-pay | Admitting: Gastroenterology

## 2020-08-20 ENCOUNTER — Ambulatory Visit (INDEPENDENT_AMBULATORY_CARE_PROVIDER_SITE_OTHER): Payer: Medicare Other

## 2020-08-20 ENCOUNTER — Other Ambulatory Visit: Payer: Self-pay

## 2020-08-20 DIAGNOSIS — I480 Paroxysmal atrial fibrillation: Secondary | ICD-10-CM | POA: Diagnosis not present

## 2020-08-20 DIAGNOSIS — Z5181 Encounter for therapeutic drug level monitoring: Secondary | ICD-10-CM | POA: Diagnosis not present

## 2020-08-20 DIAGNOSIS — I4891 Unspecified atrial fibrillation: Secondary | ICD-10-CM

## 2020-08-20 LAB — POCT INR: INR: 2.8 (ref 2.0–3.0)

## 2020-08-20 NOTE — Patient Instructions (Signed)
-   Continue taking Warfarin 4mg  daily except 6mg  on Tuesdays, Thursdays, and Saturdays.  -Recheck INR 6 weeks.   Call Coumadin Clinic [phone] Main # [phone] with any questions

## 2020-08-23 ENCOUNTER — Other Ambulatory Visit: Payer: Self-pay

## 2020-08-23 ENCOUNTER — Encounter: Payer: Self-pay | Admitting: Podiatry

## 2020-08-23 ENCOUNTER — Ambulatory Visit (INDEPENDENT_AMBULATORY_CARE_PROVIDER_SITE_OTHER): Payer: Medicare Other | Admitting: Podiatry

## 2020-08-23 DIAGNOSIS — B351 Tinea unguium: Secondary | ICD-10-CM

## 2020-08-23 DIAGNOSIS — D689 Coagulation defect, unspecified: Secondary | ICD-10-CM | POA: Diagnosis not present

## 2020-08-23 DIAGNOSIS — M79674 Pain in right toe(s): Secondary | ICD-10-CM

## 2020-08-23 DIAGNOSIS — M79675 Pain in left toe(s): Secondary | ICD-10-CM

## 2020-08-23 DIAGNOSIS — Z7901 Long term (current) use of anticoagulants: Secondary | ICD-10-CM

## 2020-08-23 NOTE — Progress Notes (Signed)
This patient returns to my office for at risk foot care.  This patient requires this care by a professional since this patient will be at risk due to having coagulation defect.  Patient is taking coumadin.  This patient is unable to cut nails herself since the patient cannot reach her nails.These nails are painful walking and wearing shoes.  This patient presents for at risk foot care today.  General Appearance  Alert, conversant and in no acute stress.  Vascular  Dorsalis pedis and posterior tibial  pulses are palpable  bilaterally.  Capillary return is within normal limits  bilaterally. Temperature is within normal limits  bilaterally.  Neurologic  Senn-Weinstein monofilament wire test within normal limits  bilaterally. Muscle power within normal limits bilaterally.  Nails Thick disfigured discolored nails with subungual debris  Second left and first and third right foot.  No evidence of bacterial infection or drainage bilaterally.  Orthopedic  No limitations of motion  feet .  No crepitus or effusions noted.  No bony pathology or digital deformities noted.  Skin  normotropic skin with no porokeratosis noted bilaterally.  No signs of infections or ulcers noted.     Onychomycosis  Pain in right toes  Pain in left toes  Consent was obtained for treatment procedures.   Mechanical debridement of nails 1-5  bilaterally performed with a nail nipper.  Filed with dremel without incident.    Return office visit   9 weeks                  Told patient to return for periodic foot care and evaluation due to potential at risk complications.   Jaishon Krisher DPM  

## 2020-10-01 ENCOUNTER — Ambulatory Visit (INDEPENDENT_AMBULATORY_CARE_PROVIDER_SITE_OTHER): Payer: Medicare Other | Admitting: *Deleted

## 2020-10-01 ENCOUNTER — Other Ambulatory Visit: Payer: Self-pay

## 2020-10-01 DIAGNOSIS — I480 Paroxysmal atrial fibrillation: Secondary | ICD-10-CM | POA: Diagnosis not present

## 2020-10-01 DIAGNOSIS — Z5181 Encounter for therapeutic drug level monitoring: Secondary | ICD-10-CM | POA: Diagnosis not present

## 2020-10-01 DIAGNOSIS — I4891 Unspecified atrial fibrillation: Secondary | ICD-10-CM

## 2020-10-01 LAB — POCT INR: INR: 2.2 (ref 2.0–3.0)

## 2020-10-01 NOTE — Patient Instructions (Signed)
Description   Continue taking Warfarin 4mg  daily except 6mg  on Tuesdays, Thursdays, and Saturdays. Recheck INR 6 weeks. Call Coumadin Clinic [phone] Main # [phone] with any questions

## 2020-10-07 ENCOUNTER — Encounter: Payer: Self-pay | Admitting: Internal Medicine

## 2020-10-18 ENCOUNTER — Other Ambulatory Visit: Payer: Self-pay | Admitting: Orthopaedic Surgery

## 2020-10-18 DIAGNOSIS — M25571 Pain in right ankle and joints of right foot: Secondary | ICD-10-CM

## 2020-10-22 ENCOUNTER — Telehealth: Payer: Self-pay | Admitting: *Deleted

## 2020-10-22 NOTE — Telephone Encounter (Signed)
   [facility] Pre-operative Risk Assessment    Patient Name: [name]  DOB: [birth year] MRN: [id number]  HEARTCARE STAFF:  - IMPORTANT!!!!!! Under Visit Info/Reason for Call, type in Other and utilize the format Clearance MM/DD/YY or Clearance TBD. Do not use dashes or single digits. - Please review there is not already an duplicate clearance open for this procedure. - If request is for dental extraction, please clarify the # of teeth to be extracted. - If the patient is currently at the dentist's office, call Pre-Op Callback Staff (MA/nurse) to input urgent request.  - If the patient is not currently in the dentist office, please route to the Pre-Op pool.  Request for surgical clearance:  What type of surgery is being performed? RIGHT ANKLE ARTHROSCOPIC DEBRIDEMENT, Tx OF TALUS OSTEOCHONDRAL LESION, LATERAL LIGAMENT RECONSTRUCTION, PERONEAL DEBRIDEMENT VERSUS REPAIR, REPAIR DISLOCATED PERONEAL TENDONS  When is this surgery scheduled? 11/12/20  What type of clearance is required (medical clearance vs. Pharmacy clearance to hold med vs. Both)? BOTH  Are there any medications that need to be held prior to surgery and how long? WARFARIN  Practice name and name of physician performing surgery? [facility]; DR. Radene Journey  What is the office phone number? [phone]   7.   What is the office fax number? [phone] ATTN: [place]  8.   Anesthesia type (None, local, MAC, general) ? GENERAL    Julaine Hua 10/22/2020, 3:19 PM  _________________________________________________________________   (provider comments below)

## 2020-10-22 NOTE — Telephone Encounter (Signed)
Clinical pharmacist to review Coumadin.  Note, patient's next Coumadin clinic visit is on 8/23, the same day of the surgery.

## 2020-10-23 NOTE — Telephone Encounter (Signed)
Patient with diagnosis of A Fib and DVT on warfarin for anticoagulation.    Procedure: RIGHT ANKLE ARTHROSCOPIC DEBRIDEMENT, Tx OF TALUS OSTEOCHONDRAL LESION, LATERAL LIGAMENT RECONSTRUCTION, PERONEAL DEBRIDEMENT VERSUS REPAIR, REPAIR DISLOCATED PERONEAL TENDONS Date of procedure: 11/12/20   CHA2DS2-VASc Score = 4  This indicates a 4.8% annual risk of stroke. The patient's score is based upon: CHF History: No HTN History: Yes Diabetes History: Yes Stroke History: No Vascular Disease History: No Age Score: 1 Gender Score: 1  CrCl: 102 mL/min Platelet Count: 326K  Patient with history of A Fib and multiple DVTs.  Patient will likely need to hold warfarin for 5 days along with Lovenox bridge.  Will route to Dr Rayann Heman for input.

## 2020-10-26 NOTE — Telephone Encounter (Signed)
I agree with lovenox bridge.

## 2020-10-28 ENCOUNTER — Encounter: Payer: Self-pay | Admitting: Podiatry

## 2020-10-28 ENCOUNTER — Ambulatory Visit (INDEPENDENT_AMBULATORY_CARE_PROVIDER_SITE_OTHER): Payer: Medicare Other | Admitting: Podiatry

## 2020-10-28 ENCOUNTER — Other Ambulatory Visit: Payer: Self-pay

## 2020-10-28 DIAGNOSIS — M79675 Pain in left toe(s): Secondary | ICD-10-CM | POA: Diagnosis not present

## 2020-10-28 DIAGNOSIS — B351 Tinea unguium: Secondary | ICD-10-CM

## 2020-10-28 DIAGNOSIS — D689 Coagulation defect, unspecified: Secondary | ICD-10-CM

## 2020-10-28 DIAGNOSIS — M79674 Pain in right toe(s): Secondary | ICD-10-CM

## 2020-10-28 NOTE — Progress Notes (Signed)
This patient returns to my office for at risk foot care.  This patient requires this care by a professional since this patient will be at risk due to having coagulation defect.  Patient is taking coumadin.  This patient is unable to cut nails herself since the patient cannot reach her nails.These nails are painful walking and wearing shoes.  This patient presents for at risk foot care today.  General Appearance  Alert, conversant and in no acute stress.  Vascular  Dorsalis pedis and posterior tibial  pulses are palpable  bilaterally.  Capillary return is within normal limits  bilaterally. Temperature is within normal limits  bilaterally.  Neurologic  Senn-Weinstein monofilament wire test within normal limits  bilaterally. Muscle power within normal limits bilaterally.  Nails Thick disfigured discolored nails with subungual debris  Second left and first and third right foot.  No evidence of bacterial infection or drainage bilaterally.  Orthopedic  No limitations of motion  feet .  No crepitus or effusions noted.  No bony pathology or digital deformities noted.  Skin  normotropic skin with no porokeratosis noted bilaterally.  No signs of infections or ulcers noted.     Onychomycosis  Pain in right toes  Pain in left toes  Consent was obtained for treatment procedures.   Mechanical debridement of nails 1-5  bilaterally performed with a nail nipper.  Filed with dremel without incident.    Return office visit   9 weeks                  Told patient to return for periodic foot care and evaluation due to potential at risk complications.   Kasen Sako DPM  

## 2020-10-28 NOTE — Telephone Encounter (Signed)
Spoke with pt to have her Anticoagulation Appt moved up so she can come in for Lovenox Bridge prior to schedule. Appt set on 11/06/20 at 1115am.

## 2020-10-28 NOTE — Telephone Encounter (Signed)
Primary Cardiologist:James Allred, MD  Chart reviewed as part of pre-operative protocol coverage. Because of [name]'s past medical history and time since last visit, he/she will require a follow-up visit in order to better assess preoperative cardiovascular risk.  Pre-op covering staff: - Please schedule appointment and call patient to inform them. - Please contact requesting surgeon's office via preferred method (i.e, phone, fax) to inform them of need for appointment prior to surgery.  If applicable, this message will also be routed to pharmacy pool and/or primary cardiologist for input on holding anticoagulant/antiplatelet agent as requested below so that this information is available at time of patient's appointment.   [name], NP  10/28/2020, 9:52 AM

## 2020-10-28 NOTE — Telephone Encounter (Signed)
Will forward to [facility] Coumadin clinic as well, as this is where her INR is monitored.  Note that her next scheduled INR is the date of surgical procedure.  Office will need to call and re-schedule for earlier date.

## 2020-10-29 NOTE — Telephone Encounter (Signed)
Patient has scheduled an appointment for 11/06/20. Will route to requesting surgeons office via the epic fax function to make them aware.

## 2020-11-03 NOTE — Progress Notes (Signed)
PCP:  Antony Contras, MD Primary Cardiologist: Thompson Grayer, MD Electrophysiologist: Thompson Grayer, MD   [name] is a 65 y.o. female seen today for Thompson Grayer, MD for cardiac clearance for procedure as below.  Since last being seen in our clinic the patient reports doing well overall. She had some trouble with fatigue and SOB in the heat and humidity. Feeling better over past several days with cooler temps. She wonders if some of this was also related to debility from her ankle. She denies chest pain, palpitations, PND, orthopnea, nausea, vomiting, dizziness, syncope, edema, weight gain, or early satiety.  She has been told she will be very minimal weight bearing for a few weeks.  Past Medical History:  Diagnosis Date   Allergic rhinitis    Arthritis    "back" (05/07/2016)   Asthma    Atrial flutter ([place])    a. Remotely per notes.   Carpal tunnel syndrome, bilateral    Colon polyps    Diverticulitis    DVT (deep venous thrombosis) ([place]) 1980s x2 -    between birth of two sons. Saw hematology - was told she has a hypercoagulable disorder, should be on Coumadin lifelong.   Dysrhythmia    atrial fib   Family history of adverse reaction to anesthesia    "mother gets PONV"   Gallbladder disease    GERD (gastroesophageal reflux disease)    GIB (gastrointestinal bleeding) 04/14/2018   Halothane adverse reaction    narrow small opening   Heart murmur    Hiatal hernia    History of hiatal hernia    "small one/CTA 05/01/2016" (05/07/2016)   HTN (hypertension)    Hypercholesteremia    hx; "brought it down w/diet" (05/07/2016)   Hyperglycemia    a. A1c 6.1 in 2014.   Inguinal hernia    Iron deficiency anemia due to chronic blood loss 04/14/2018   Iron malabsorption 04/14/2018   Left sided sciatica    Normal cardiac stress test 06/2012   Obesity    OSA (obstructive sleep apnea)    a. Mild, did not tolerate CPAP (05/07/2016)   PAF (paroxysmal atrial fibrillation) ([place])    a. s/p  afib ablation at [facility] 2010 (Dr. Annabell Howells). b. Recurrent AF s/p DCCV 06/2010. c. On flecainide.    Paroxysmal atrial fibrillation ([place]) 09/09/2009   Qualifier: Diagnosis of  By: Selena Batten CMA, [place]     PONV (postoperative nausea and vomiting)    PPD positive, treated 1977   "treated for 1 yr after exposure to patient"   Pre-diabetes    RSV infection ~ 2006   Wandering (atrial) pacemaker    a. Remotely per notes.  (  pt. states has a wandering p wave ) no pacemaker   Past Surgical History:  Procedure Laterality Date   ATRIAL ABLATION SURGERY  05/07/2016   "fib and flutter"   ATRIAL FIBRILLATION ABLATION  2010   at [place] 05/07/2016   Procedure: Atrial Fibrillation Ablation;  Surgeon: Thompson Grayer, MD;  Location: [facility];  Service: Cardiovascular;  Laterality: N/A;   BREAST CYST ASPIRATION Bilateral    CARDIAC CATHETERIZATION  2008   CARPAL TUNNEL RELEASE Bilateral    CESAREAN SECTION  1986; 1988   COLONOSCOPY  X 2   COLONOSCOPY W/ BIOPSIES AND POLYPECTOMY  X 2   CYSTOSCOPY/URETEROSCOPY/HOLMIUM LASER/STENT PLACEMENT Left 12/23/2019   Procedure: CYSTOSCOPY/URETEROSCOPY/HOLMIUM LASER/STENT PLACEMENT;  Surgeon: Robley Fries, MD;  Location: [facility];  Service: Urology;  Laterality: Left;   ESOPHAGOGASTRODUODENOSCOPY     ESOPHAGOGASTRODUODENOSCOPY (EGD) WITH PROPOFOL N/A 07/19/2017   Procedure: ESOPHAGOGASTRODUODENOSCOPY (EGD) WITH PROPOFOL;  Surgeon: Yetta Flock, MD;  Location: [facility];  Service: Gastroenterology;  Laterality: N/A;   INGUINAL HERNIA REPAIR Right    LAPAROSCOPIC CHOLECYSTECTOMY  1989   POLYPECTOMY N/A 07/19/2017   Procedure: POLYPECTOMY;  Surgeon: Yetta Flock, MD;  Location: [facility];  Service: Gastroenterology;  Laterality: N/A;   PROCTOSCOPY N/A 08/06/2017   Procedure: PROCTOSCOPY;  Surgeon: Kylii Ennis Boston, MD;  Location: [facility];  Service: General;  Laterality: N/A;   TMJ ARTHROPLASTY      TUBAL LIGATION  1988    Current Outpatient Medications  Medication Sig Dispense Refill   acetaminophen (TYLENOL) 650 MG CR tablet Take 1,300 mg by mouth every 8 (eight) hours as needed for pain.      cetirizine (ZYRTEC) 10 MG tablet Take 10 mg by mouth daily.     Cholecalciferol (VITAMIN D3) 1000 UNITS CAPS Take 2,000 Units by mouth daily.     dicyclomine (BENTYL) 10 MG capsule TAKE 1 CAPSULE BY MOUTH 30 MINS BEFORE MEALS AND AT BEDTIME AS NEEDED FOR PAIN/CRAMPING 360 capsule 1   FLOVENT HFA 220 MCG/ACT inhaler Inhale 1 puff into the lungs 2 (two) times daily.  1   fluticasone (FLONASE) 50 MCG/ACT nasal spray Place 1 spray into both nostrils 2 (two) times daily.      losartan (COZAAR) 25 MG tablet Take 1 tablet (25 mg total) by mouth daily. 90 tablet 0   meclizine (ANTIVERT) 25 MG tablet Take 1 tablet (25 mg total) by mouth 3 (three) times daily as needed for dizziness. 30 tablet 0   Multiple Vitamin (MULTIVITAMIN WITH MINERALS) TABS tablet Take 1 tablet by mouth daily.     omeprazole (PRILOSEC) 40 MG capsule Take 1 capsule (40 mg total) by mouth 2 (two) times daily. Must have an office visit for any further refills. Thank you 180 capsule 0   PROAIR HFA 108 (90 BASE) MCG/ACT inhaler Inhale 2 puffs into the lungs every 6 (six) hours as needed for wheezing or shortness of breath.      venlafaxine XR (EFFEXOR-XR) 37.5 MG 24 hr capsule Take 37.5 mg by mouth daily with breakfast.      verapamil (CALAN) 80 MG tablet Take 160 mg by mouth 2 (two) times daily.     warfarin (COUMADIN) 2 MG tablet TAKE AS DIRECTED BY COUMADIN CLINIC (Patient taking differently: Take 4-6 mg by mouth See admin instructions. '4mg'$  mon, wed, fri, and sun; 6 mg tues, thur, and sat) 225 tablet 1   [START ON 11/08/2020] enoxaparin (LOVENOX) 100 MG/ML injection Inject 1 mL (100 mg total) into the skin every 12 (twelve) hours. 20 mL 1   ezetimibe (ZETIA) 10 MG tablet Take 1 tablet (10 mg total) by mouth daily. 90 tablet 3   No  current facility-administered medications for this visit.    Allergies  Allergen Reactions   Aspirin Other (See Comments)    Makes asthma worse and makes stomach hurt   Banana Swelling and Cough    Inside of the mouth swells   Food Other (See Comments) and Cough    Nuts- mucus membranes inflamed/cough    Ivp Dye [Iodinated Diagnostic Agents] Anaphylaxis    Required epinephrine. Since then, has tolerated with premed.   Latex Other (See Comments) and Cough    Wheezing/cough    Meperidine Hcl Other (See Comments)  Projectile vomiting    Peanut-Containing Drug Products Swelling and Cough    Inside of the mouth swells   Penicillins Anaphylaxis    Has patient had a PCN reaction causing immediate rash, facial/tongue/throat swelling, SOB or lightheadedness with hypotension:Yes Has patient had a PCN reaction causing severe rash involving mucus membranes or skin necrosis:Yes Has patient had a PCN reaction that required hospitalization:Treated in ER w/epi & breathing treatments Has patient had a PCN reaction occurring within the last 10 years:No If all of the above answers are "NO", then may proceed with Cephalosporin use.    Statins Other (See Comments)    Severe leg pain - has tried most of them   Sulfonamide Derivatives Anaphylaxis   Tape Hives and Dermatitis    No plastic tape!!   Alfuzosin Other (See Comments)    Headache, dizziness   Sulfamethoxazole Other (See Comments)    Social History   Socioeconomic History   Marital status: Married    Spouse name: Not on file   Number of children: 2   Years of education: Not on file   Highest education level: Not on file  Occupational History   Occupation: Programmer, multimedia: [employer]  Tobacco Use   Smoking status: Never   Smokeless tobacco: Never  Vaping Use   Vaping Use: Never used  Substance and Sexual Activity   Alcohol use: Not Currently    Comment: 07-13-17   Drug use: No   Sexual activity: Not Currently   Other Topics Concern   Not on file  Social History Narrative   Not on file   Social Determinants of Health   Financial Resource Strain: Not on file  Food Insecurity: Not on file  Transportation Needs: Not on file  Physical Activity: Not on file  Stress: Not on file  Social Connections: Not on file  Intimate Partner Violence: Not on file    Review of Systems: All other systems reviewed and are otherwise negative except as noted above.  Physical Exam: Vitals:   11/06/20 1152  BP: 128/76  Pulse: 87  SpO2: 95%  Weight: 230 lb 3.2 oz (104.4 kg)  Height: '5\' 4"'$  (1.626 m)    GEN- The patient is well appearing, alert and oriented x 3 today.   HEENT: normocephalic, atraumatic; sclera clear, conjunctiva pink; hearing intact; oropharynx clear; neck supple, no JVP Lymph- no cervical lymphadenopathy Lungs- Clear to ausculation bilaterally, normal work of breathing.  No wheezes, rales, rhonchi Heart- Regular rate and rhythm, no murmurs, rubs or gallops, PMI not laterally displaced GI- soft, non-tender, non-distended, bowel sounds present, no hepatosplenomegaly Extremities- no clubbing, cyanosis, or edema; DP/PT/radial pulses 2+ bilaterally MS- no significant deformity or atrophy Skin- warm and dry, no rash or lesion Psych- euthymic mood, full affect Neuro- strength and sensation are intact  EKG is ordered. Personal review of EKG from today shows NSR at 87 bpm with normal intervals  Additional studies reviewed include: Previous EP office notes.   Assessment and Plan:  1. Cardiac Clearance for RIGHT ANKLE ARTHROSCOPIC DEBRIDEMENT, Tx OF TALUS OSTEOCHONDRAL LESION, LATERAL LIGAMENT RECONSTRUCTION, PERONEAL DEBRIDEMENT VERSUS REPAIR, REPAIR DISLOCATED PERONEAL TENDONS Echo 08/2015 LVEF 65-70% Myoview 04/2015 with scar but no ischemia Given no overt HF symptoms at this time to warrant retesting, OK to proceed with surgery without further work up. We discussed updating her Echo for  completeness at next visit. She will let us know if she feels she has any undue SOB.  She is  at a relatively low risk of perioperative complications from a Revised Cardiac Risk Index [place] Criteria)   2. Paroxysmal atrial fibrillation Has been well controlled off AAD therapy s/p ablation She has been stable on coumadin and has declined trying DOAC Continue coumadin for CHA2DS2-VASc of at least 4. She has also had prior DVT and long term anticoagulation Discussed risk of PAF exacerbation given pain, sedation, and recovery from surgery, as well as risk of stroke with holding Hyndman. Pt verbalized understanding and wishes to proceed with surgery.   3. HTN Stable on current regiment  4. OSA Encouraged nightly CPAP  Shirley Friar, [place]  11/06/20 12:18 PM

## 2020-11-06 ENCOUNTER — Other Ambulatory Visit: Payer: Self-pay

## 2020-11-06 ENCOUNTER — Ambulatory Visit (INDEPENDENT_AMBULATORY_CARE_PROVIDER_SITE_OTHER): Payer: Medicare Other | Admitting: Student

## 2020-11-06 ENCOUNTER — Ambulatory Visit (INDEPENDENT_AMBULATORY_CARE_PROVIDER_SITE_OTHER): Payer: Medicare Other

## 2020-11-06 ENCOUNTER — Encounter: Payer: Self-pay | Admitting: Student

## 2020-11-06 VITALS — BP 128/76 | HR 87 | Ht 64.0 in | Wt 230.2 lb

## 2020-11-06 DIAGNOSIS — Z5181 Encounter for therapeutic drug level monitoring: Secondary | ICD-10-CM

## 2020-11-06 DIAGNOSIS — I480 Paroxysmal atrial fibrillation: Secondary | ICD-10-CM | POA: Diagnosis not present

## 2020-11-06 DIAGNOSIS — I10 Essential (primary) hypertension: Secondary | ICD-10-CM | POA: Diagnosis not present

## 2020-11-06 DIAGNOSIS — Z01818 Encounter for other preprocedural examination: Secondary | ICD-10-CM

## 2020-11-06 DIAGNOSIS — I482 Chronic atrial fibrillation, unspecified: Secondary | ICD-10-CM

## 2020-11-06 DIAGNOSIS — I4891 Unspecified atrial fibrillation: Secondary | ICD-10-CM

## 2020-11-06 DIAGNOSIS — Z01810 Encounter for preprocedural cardiovascular examination: Secondary | ICD-10-CM

## 2020-11-06 DIAGNOSIS — G4733 Obstructive sleep apnea (adult) (pediatric): Secondary | ICD-10-CM

## 2020-11-06 LAB — BASIC METABOLIC PANEL
BUN/Creatinine Ratio: 24 (ref 12–28)
BUN: 16 mg/dL (ref 8–27)
CO2: 25 mmol/L (ref 20–29)
Calcium: 10 mg/dL (ref 8.7–10.3)
Chloride: 98 mmol/L (ref 96–106)
Creatinine, Ser: 0.66 mg/dL (ref 0.57–1.00)
Glucose: 121 mg/dL — ABNORMAL HIGH (ref 65–99)
Potassium: 3.8 mmol/L (ref 3.5–5.2)
Sodium: 138 mmol/L (ref 134–144)
eGFR: 97 mL/min/{1.73_m2} (ref >59)

## 2020-11-06 LAB — CBC WITH DIFFERENTIAL/PLATELET
Basophils Absolute: 0.1 10*3/uL (ref 0.0–0.2)
Basos: 1 %
EOS (ABSOLUTE): 0.1 10*3/uL (ref 0.0–0.4)
Eos: 1 %
Hematocrit: 41.5 % (ref 34.0–46.6)
Hemoglobin: 14.4 g/dL (ref 11.1–15.9)
Immature Grans (Abs): 0 10*3/uL (ref 0.0–0.1)
Immature Granulocytes: 0 %
Lymphocytes Absolute: 3.2 10*3/uL — ABNORMAL HIGH (ref 0.7–3.1)
Lymphs: 33 %
MCH: 30.8 pg (ref 26.6–33.0)
MCHC: 34.7 g/dL (ref 31.5–35.7)
MCV: 89 fL (ref 79–97)
Monocytes Absolute: 1 10*3/uL — ABNORMAL HIGH (ref 0.1–0.9)
Monocytes: 10 %
Neutrophils Absolute: 5.4 10*3/uL (ref 1.4–7.0)
Neutrophils: 55 %
Platelets: 362 10*3/uL (ref 150–450)
RBC: 4.67 x10E6/uL (ref 3.77–5.28)
RDW: 13.2 % (ref 11.7–15.4)
WBC: 9.7 10*3/uL (ref 3.4–10.8)

## 2020-11-06 LAB — POCT INR: INR: 3.1 — AB (ref 2.0–3.0)

## 2020-11-06 MED ORDER — ENOXAPARIN SODIUM 100 MG/ML IJ SOSY: 100.0000 mg | PREFILLED_SYRINGE | Freq: Two times a day (BID) | INTRAMUSCULAR | 1 refills | Status: DC

## 2020-11-06 NOTE — Patient Instructions (Signed)
Medication Instructions:  Your physician recommends that you continue on your current medications as directed. Please refer to the Current Medication list given to you today.  *If you need a refill on your cardiac medications before your next appointment, please call your pharmacy*   Lab Work: TODAY: BMET, CBC  If you have labs (blood work) drawn today and your tests are completely normal, you will receive your results only by: [place] (if you have MyChart) OR A paper copy in the mail If you have any lab test that is abnormal or we need to change your treatment, we will call you to review the results.   Follow-Up: At [facility], you and your health needs are our priority.  As part of our continuing mission to provide you with exceptional heart care, we have created designated Provider Care Teams.  These Care Teams include your primary Cardiologist (physician) and Advanced Practice Providers (APPs -  Physician Assistants and Nurse Practitioners) who all work together to provide you with the care you need, when you need it.  We recommend signing up for the patient portal called "MyChart".  Sign up information is provided on this After Visit Summary.  MyChart is used to connect with patients for Virtual Visits (Telemedicine).  Patients are able to view lab/test results, encounter notes, upcoming appointments, etc.  Non-urgent messages can be sent to your provider as well.   To learn more about what you can do with MyChart, go to [email or web address].    Your next appointment:   6 month(s)  The format for your next appointment:   In Person  Provider:   You may see Thompson Grayer, MD or one of the following Advanced Practice Providers on your designated Care Team:   Tommye Standard, [place] Legrand Como "[facility]" [place], [place]

## 2020-11-06 NOTE — Patient Instructions (Addendum)
Description   Today take 1 tablet then start holding for upcoming procedure. Normal dose: Warfarin '4mg'$  daily except '6mg'$  on Tuesdays, Thursdays, and Saturdays. Recheck INR post procedure on 8/29. Call Coumadin Clinic [phone] Main # [phone] with any questions       8/17: Last dose of warfarin.  8/18: No warfarin or enoxaparin (Lovenox).  8/19: Inject enoxaparin '100mg'$   in the fatty abdominal tissue at least 2 inches from the belly button twice a day about 12 hours apart, 8am and 8pm rotate sites. No warfarin.  8/20: Inject enoxaparin in the fatty tissue every 12 hours, 8am and 8pm. No warfarin.  8/21: Inject enoxaparin in the fatty tissue every 12 hours, 8am and 8pm. No warfarin.  8/22: Inject enoxaparin in the fatty tissue in the morning at 8 am (No PM dose). No warfarin.  8/23: Procedure Day - No enoxaparin - Resume warfarin in the evening or as directed by doctor (take an extra half tablet with usual dose for 2 days then resume normal dose).  8/24: Resume enoxaparin inject in the fatty tissue every 12 hours and take warfarin  8/25: Inject enoxaparin in the fatty tissue every 12 hours and take warfarin  8/26: Inject enoxaparin in the fatty tissue every 12 hours and take warfarin  8/27: Inject enoxaparin in the fatty tissue every 12 hours and take warfarin  8/28: Inject enoxaparin in the fatty tissue every 12 hours and take warfarin  8/29: Inject enoxaparin at 8am then report to warfarin appt to check INR.

## 2020-11-07 ENCOUNTER — Ambulatory Visit
Admission: RE | Admit: 2020-11-07 | Discharge: 2020-11-07 | Disposition: A | Discharge status: Home / Self Care | Payer: Medicare Other | Source: Ambulatory Visit | Attending: Orthopaedic Surgery | Admitting: Orthopaedic Surgery

## 2020-11-07 DIAGNOSIS — M25571 Pain in right ankle and joints of right foot: Secondary | ICD-10-CM

## 2020-11-07 NOTE — Addendum Note (Signed)
Addended byDanielle Dess on: 11/07/2020 02:21 PM   Modules accepted: Orders

## 2020-11-12 HISTORY — PX: ANKLE ARTHODESIS W/ ARTHROSCOPY: SUR63

## 2020-11-18 ENCOUNTER — Ambulatory Visit (INDEPENDENT_AMBULATORY_CARE_PROVIDER_SITE_OTHER): Payer: Medicare Other

## 2020-11-18 ENCOUNTER — Other Ambulatory Visit: Payer: Self-pay

## 2020-11-18 DIAGNOSIS — I4891 Unspecified atrial fibrillation: Secondary | ICD-10-CM

## 2020-11-18 DIAGNOSIS — Z5181 Encounter for therapeutic drug level monitoring: Secondary | ICD-10-CM | POA: Diagnosis not present

## 2020-11-18 DIAGNOSIS — I480 Paroxysmal atrial fibrillation: Secondary | ICD-10-CM | POA: Diagnosis not present

## 2020-11-18 LAB — POCT INR: INR: 1.6 — AB (ref 2.0–3.0)

## 2020-11-18 NOTE — Patient Instructions (Addendum)
Description   Continue Lovenox injections. Take an extra tablet today (already taken 2 tablets) and then continue taking Warfarin '4mg'$  daily except '6mg'$  on Tuesdays, Thursdays, and Saturdays. Recheck INR 1 week.  Call Coumadin Clinic [phone] Main # [phone] with any questions

## 2020-11-26 ENCOUNTER — Other Ambulatory Visit: Payer: Self-pay

## 2020-11-26 ENCOUNTER — Ambulatory Visit (INDEPENDENT_AMBULATORY_CARE_PROVIDER_SITE_OTHER): Payer: Medicare Other

## 2020-11-26 DIAGNOSIS — Z5181 Encounter for therapeutic drug level monitoring: Secondary | ICD-10-CM | POA: Diagnosis not present

## 2020-11-26 DIAGNOSIS — I480 Paroxysmal atrial fibrillation: Secondary | ICD-10-CM | POA: Diagnosis not present

## 2020-11-26 DIAGNOSIS — I4891 Unspecified atrial fibrillation: Secondary | ICD-10-CM

## 2020-11-26 LAB — POCT INR: INR: 2 (ref 2.0–3.0)

## 2020-11-26 NOTE — Patient Instructions (Addendum)
Description   Stop Lovenox injections. Continue taking Warfarin '4mg'$  daily except '6mg'$  on Tuesdays, Thursdays, and Saturdays. Recheck INR 2 weeks.  Call Coumadin Clinic [phone] Main # [phone] with any questions

## 2020-12-10 ENCOUNTER — Encounter: Payer: Self-pay | Admitting: Neurology

## 2020-12-10 ENCOUNTER — Other Ambulatory Visit: Payer: Self-pay

## 2020-12-10 ENCOUNTER — Ambulatory Visit (INDEPENDENT_AMBULATORY_CARE_PROVIDER_SITE_OTHER): Payer: Medicare Other | Admitting: *Deleted

## 2020-12-10 DIAGNOSIS — Z5181 Encounter for therapeutic drug level monitoring: Secondary | ICD-10-CM | POA: Diagnosis not present

## 2020-12-10 DIAGNOSIS — I480 Paroxysmal atrial fibrillation: Secondary | ICD-10-CM | POA: Diagnosis not present

## 2020-12-10 DIAGNOSIS — I4891 Unspecified atrial fibrillation: Secondary | ICD-10-CM

## 2020-12-10 LAB — POCT INR: INR: 2.3 (ref 2.0–3.0)

## 2020-12-10 NOTE — Patient Instructions (Signed)
Description   Since on the prednisone taper take 4mg  on Thursday then continue taking Warfarin 4mg  daily except 6mg  on Tuesdays, Thursdays, and Saturdays. Recheck INR on Friday-on prednisone taper. Call Coumadin Clinic [phone] Main # [phone] with any questions

## 2020-12-12 ENCOUNTER — Other Ambulatory Visit: Payer: Self-pay | Admitting: Internal Medicine

## 2020-12-12 DIAGNOSIS — I480 Paroxysmal atrial fibrillation: Secondary | ICD-10-CM

## 2020-12-12 NOTE — Telephone Encounter (Signed)
Prescription refill request received for warfarin Lov: 11/06/20 Chalmers Cater)  Next INR check: 12/13/20 Warfarin tablet strength: 2mg   Appropriate dose and refill sent to requested pharmacy.

## 2020-12-13 ENCOUNTER — Ambulatory Visit (INDEPENDENT_AMBULATORY_CARE_PROVIDER_SITE_OTHER): Payer: Medicare Other

## 2020-12-13 ENCOUNTER — Other Ambulatory Visit: Payer: Self-pay

## 2020-12-13 DIAGNOSIS — Z5181 Encounter for therapeutic drug level monitoring: Secondary | ICD-10-CM | POA: Diagnosis not present

## 2020-12-13 DIAGNOSIS — I4891 Unspecified atrial fibrillation: Secondary | ICD-10-CM | POA: Diagnosis not present

## 2020-12-13 DIAGNOSIS — I480 Paroxysmal atrial fibrillation: Secondary | ICD-10-CM

## 2020-12-13 LAB — POCT INR: INR: 3 (ref 2.0–3.0)

## 2020-12-13 NOTE — Patient Instructions (Signed)
-   Since you are on the prednisone taper take 4mg  every day until recheck next Wednesday - Have a serving of greens tonight  Call Coumadin Clinic [phone] Main # [phone] with any questions

## 2020-12-18 ENCOUNTER — Other Ambulatory Visit: Payer: Self-pay

## 2020-12-18 ENCOUNTER — Ambulatory Visit (INDEPENDENT_AMBULATORY_CARE_PROVIDER_SITE_OTHER): Payer: Medicare Other | Admitting: *Deleted

## 2020-12-18 DIAGNOSIS — I4891 Unspecified atrial fibrillation: Secondary | ICD-10-CM | POA: Diagnosis not present

## 2020-12-18 DIAGNOSIS — I480 Paroxysmal atrial fibrillation: Secondary | ICD-10-CM | POA: Diagnosis not present

## 2020-12-18 DIAGNOSIS — Z5181 Encounter for therapeutic drug level monitoring: Secondary | ICD-10-CM | POA: Diagnosis not present

## 2020-12-18 LAB — POCT INR: INR: 2.4 (ref 2.0–3.0)

## 2020-12-18 NOTE — Patient Instructions (Signed)
Description   Resume taking Warfarin 4mg  daily except 6mg  on Tuesdays, Thursdays, and Saturday. Recheck INR in 2 weeks. Call Coumadin Clinic [phone] Main # [phone] with any questions

## 2020-12-25 ENCOUNTER — Encounter: Payer: Self-pay | Admitting: *Deleted

## 2020-12-26 ENCOUNTER — Ambulatory Visit (INDEPENDENT_AMBULATORY_CARE_PROVIDER_SITE_OTHER): Payer: Medicare Other | Admitting: Psychiatry

## 2020-12-26 ENCOUNTER — Encounter: Payer: Self-pay | Admitting: Psychiatry

## 2020-12-26 VITALS — BP 148/94 | HR 90 | Ht 64.0 in | Wt 234.0 lb

## 2020-12-26 DIAGNOSIS — R42 Dizziness and giddiness: Secondary | ICD-10-CM | POA: Diagnosis not present

## 2020-12-26 NOTE — Patient Instructions (Signed)
Vestibular rehab for vertigo ENT referral

## 2020-12-26 NOTE — Progress Notes (Signed)
[facility]  PATIENT: [name] DOB: [birth year]  REFERRING CLINICIAN: Antony Contras, MD HISTORY FROM: self REASON FOR VISIT: vertigo   HISTORICAL  CHIEF COMPLAINT:  Chief Complaint  Patient presents with   Dizziness    RM 1 alone  Pt is well, she has been having dizzy spells off nad on for aver 10 yrs but after ankle surgery in August 2022 she started experiencing a lot of dizziness. If she turns her head to the left, lay to the left, stands to long she gets nauseated and feels like she is going to faint.     HISTORY OF PRESENT ILLNESS:  The patient presents for evaluation of vertigo which has been present on and off for the past 10 years, but became more frequent after ankle surgery in August 2022. She has had vertigo every time she turns her head left for the past 5 weeks. When she lies down on the left side or turns her head to the left the room spins. Gets nauseated when she gets vertigo. Vertigo lasts for a few minutes until she changes position and then it stops. Happens multiple times per day. It is associated with significant sinus pressure and palpitations.  She had a [facility] 06/03/20 which was unremarkable. Also had an MRI brain in 2019 for dizziness which was normal.  She previously followed with ENT for BPPV several years ago.  OTHER MEDICAL CONDITIONS: HLD, HTN, asthma, DM2, OSA, afib on coumadin   REVIEW OF SYSTEMS: Full 14 system review of systems performed and negative with exception of: vertigo, nausea  ALLERGIES: Allergies  Allergen Reactions   Aspirin Other (See Comments)    Makes asthma worse and makes stomach hurt   Banana Swelling and Cough    Inside of the mouth swells   Food Other (See Comments) and Cough    Nuts- mucus membranes inflamed/cough    Ivp Dye [Iodinated Diagnostic Agents] Anaphylaxis    Required epinephrine. Since then, has tolerated with premed.   Latex Other (See Comments) and Cough    Wheezing/cough     Meperidine Hcl Other (See Comments)    Projectile vomiting    Peanut-Containing Drug Products Swelling and Cough    Inside of the mouth swells   Penicillins Anaphylaxis    Has patient had a PCN reaction causing immediate rash, facial/tongue/throat swelling, SOB or lightheadedness with hypotension:Yes Has patient had a PCN reaction causing severe rash involving mucus membranes or skin necrosis:Yes Has patient had a PCN reaction that required hospitalization:Treated in ER w/epi & breathing treatments Has patient had a PCN reaction occurring within the last 10 years:No If all of the above answers are "NO", then may proceed with Cephalosporin use.    Statins Other (See Comments)    Severe leg pain - has tried most of them   Sulfonamide Derivatives Anaphylaxis   Tape Hives and Dermatitis    No plastic tape!!   Alfuzosin Other (See Comments)    Headache, dizziness   Other     Vascepa- upset stomach   Sulfamethoxazole Hives    HOME MEDICATIONS: Outpatient Medications Prior to Visit  Medication Sig Dispense Refill   acetaminophen (TYLENOL) 650 MG CR tablet Take 1,300 mg by mouth every 8 (eight) hours as needed for pain.      cetirizine (ZYRTEC) 10 MG tablet Take 10 mg by mouth daily.     Cholecalciferol (VITAMIN D3) 1000 UNITS CAPS Take 2,000 Units by mouth daily.     dicyclomine (BENTYL)  10 MG capsule TAKE 1 CAPSULE BY MOUTH 30 MINS BEFORE MEALS AND AT BEDTIME AS NEEDED FOR PAIN/CRAMPING 360 capsule 1   ezetimibe (ZETIA) 10 MG tablet Take 1 tablet (10 mg total) by mouth daily. 90 tablet 3   FLOVENT HFA 220 MCG/ACT inhaler Inhale 1 puff into the lungs 2 (two) times daily.  1   fluticasone (FLONASE) 50 MCG/ACT nasal spray Place 1 spray into both nostrils 2 (two) times daily.      losartan (COZAAR) 25 MG tablet Take 1 tablet (25 mg total) by mouth daily. 90 tablet 0   meclizine (ANTIVERT) 25 MG tablet Take 1 tablet (25 mg total) by mouth 3 (three) times daily as needed for dizziness. 30  tablet 0   Multiple Vitamin (MULTIVITAMIN WITH MINERALS) TABS tablet Take 1 tablet by mouth daily.     omeprazole (PRILOSEC) 40 MG capsule Take 1 capsule (40 mg total) by mouth 2 (two) times daily. Must have an office visit for any further refills. Thank you 180 capsule 0   PROAIR HFA 108 (90 BASE) MCG/ACT inhaler Inhale 2 puffs into the lungs every 6 (six) hours as needed for wheezing or shortness of breath.      venlafaxine XR (EFFEXOR-XR) 37.5 MG 24 hr capsule Take 37.5 mg by mouth daily with breakfast.      verapamil (CALAN) 80 MG tablet Take 160 mg by mouth 2 (two) times daily.     warfarin (COUMADIN) 2 MG tablet TAKE AS DIRECTED BY COUMADIN CLINIC 225 tablet 1   enoxaparin (LOVENOX) 100 MG/ML injection Inject 1 mL (100 mg total) into the skin every 12 (twelve) hours. 20 mL 1   No facility-administered medications prior to visit.    PAST MEDICAL HISTORY: Past Medical History:  Diagnosis Date   Allergic rhinitis    Arthritis    "back" (05/07/2016)   Asthma    Atrial flutter ([place])    a. Remotely per notes.   Carpal tunnel syndrome, bilateral    Colon polyps    Diverticulitis    Dizziness    DVT (deep venous thrombosis) ([place]) 1980s x2 -    between birth of two sons. Saw hematology - was told she has a hypercoagulable disorder, should be on Coumadin lifelong.   Dysrhythmia    atrial fib   Family history of adverse reaction to anesthesia    "mother gets PONV"   Gallbladder disease    GERD (gastroesophageal reflux disease)    GIB (gastrointestinal bleeding) 04/14/2018   Halothane adverse reaction    narrow small opening   Heart murmur    Hiatal hernia    History of hiatal hernia    "small one/CTA 05/01/2016" (05/07/2016)   HTN (hypertension)    Hypercholesteremia    hx; "brought it down w/diet" (05/07/2016)   Hyperglycemia    a. A1c 6.1 in 2014.   Inguinal hernia    Iron deficiency anemia due to chronic blood loss 04/14/2018   Iron malabsorption 04/14/2018   Left sided  sciatica    Normal cardiac stress test 06/2012   Obesity    OSA (obstructive sleep apnea)    a. Mild, did not tolerate CPAP (05/07/2016)   PAF (paroxysmal atrial fibrillation) ([place])    a. s/p afib ablation at [facility] 2010 (Dr. Annabell Howells). b. Recurrent AF s/p DCCV 06/2010. c. On flecainide.    Paroxysmal atrial fibrillation ([place]) 09/09/2009   Qualifier: Diagnosis of  By: Selena Batten CMA, [place]     PONV (postoperative nausea and  vomiting)    PPD positive, treated 1977   "treated for 1 yr after exposure to patient"   Pre-diabetes    RSV infection ~ 2006   Vertigo    Wandering (atrial) pacemaker    a. Remotely per notes.  (  pt. states has a wandering p wave ) no pacemaker    PAST SURGICAL HISTORY: Past Surgical History:  Procedure Laterality Date   ANKLE ARTHODESIS W/ ARTHROSCOPY Right 11/12/2020   ATRIAL ABLATION SURGERY  05/07/2016   "fib and flutter"   ATRIAL FIBRILLATION ABLATION  2010   at [place] N/A 05/07/2016   Procedure: Atrial Fibrillation Ablation;  Surgeon: Thompson Grayer, MD;  Location: [facility];  Service: Cardiovascular;  Laterality: N/A;   BREAST CYST ASPIRATION Bilateral    CARDIAC CATHETERIZATION  2008   CARPAL TUNNEL RELEASE Bilateral    CESAREAN SECTION  1986; 1988   COLONOSCOPY  X 2   COLONOSCOPY W/ BIOPSIES AND POLYPECTOMY  X 2   CYSTOSCOPY/URETEROSCOPY/HOLMIUM LASER/STENT PLACEMENT Left 12/23/2019   Procedure: CYSTOSCOPY/URETEROSCOPY/HOLMIUM LASER/STENT PLACEMENT;  Surgeon: Robley Fries, MD;  Location: [facility];  Service: Urology;  Laterality: Left;   ESOPHAGOGASTRODUODENOSCOPY     ESOPHAGOGASTRODUODENOSCOPY (EGD) WITH PROPOFOL N/A 07/19/2017   Procedure: ESOPHAGOGASTRODUODENOSCOPY (EGD) WITH PROPOFOL;  Surgeon: Yetta Flock, MD;  Location: [facility];  Service: Gastroenterology;  Laterality: N/A;   INGUINAL HERNIA REPAIR Right    LAPAROSCOPIC CHOLECYSTECTOMY  1989   POLYPECTOMY N/A 07/19/2017    Procedure: POLYPECTOMY;  Surgeon: Yetta Flock, MD;  Location: [facility];  Service: Gastroenterology;  Laterality: N/A;   PROCTOSCOPY N/A 08/06/2017   Procedure: PROCTOSCOPY;  Surgeon: Michael Boston, MD;  Location: [facility];  Service: General;  Laterality: N/A;   TMJ ARTHROPLASTY     TUBAL LIGATION  1988    FAMILY HISTORY: Family History  Problem Relation Age of Onset   Hypertension Mother    Stroke Mother    Heart attack Mother        2 previous MIs, 3 stents - CAD first diagnosed 1   Thyroid disease Mother    Hyperlipidemia Father    Colon polyps Father    Lupus Sister    Thyroid disease Sister        x 2   Hyperlipidemia Maternal Grandmother    Heart attack Maternal Grandmother    Diabetes Maternal Grandmother    Hypertension Maternal Grandmother    Hypertension Maternal Grandfather    Stroke Maternal Grandfather        Multiple family members   Stroke Paternal Grandmother    Heart attack Paternal Grandfather        Died at 66 of massive MI   Colon cancer Neg Hx    Stomach cancer Neg Hx     SOCIAL HISTORY: Social History   Socioeconomic History   Marital status: Married    Spouse name: Dominica Severin   Number of children: 2   Years of education: Not on file   Highest education level: Master's degree (e.g., MA, MS, MEng, MEd, MSW, MBA)  Occupational History   Occupation: Programmer, multimedia: [employer]  Tobacco Use   Smoking status: Never   Smokeless tobacco: Never  Vaping Use   Vaping Use: Never used  Substance and Sexual Activity   Alcohol use: Not Currently    Comment: rare   Drug use: No   Sexual activity: Not Currently  Other Topics Concern  Not on file  Social History Narrative   Lives with husband   Social Determinants of Health   Financial Resource Strain: Not on file  Food Insecurity: Not on file  Transportation Needs: Not on file  Physical Activity: Not on file  Stress: Not on file  Social Connections: Not on file  Intimate  Partner Violence: Not on file     PHYSICAL EXAM  GENERAL EXAM/CONSTITUTIONAL: Vitals:  Vitals:   12/26/20 1528  BP: (!) 148/94  Pulse: 90  Weight: 234 lb (106.1 kg)  Height: 5\' 4"  (1.626 m)   Body mass index is 40.17 kg/m. Wt Readings from Last 3 Encounters:  12/26/20 234 lb (106.1 kg)  11/06/20 230 lb 3.2 oz (104.4 kg)  06/03/20 219 lb (99.3 kg)   Patient is in no distress; well developed, nourished and groomed; neck is supple  CARDIOVASCULAR: Examination of peripheral vascular system by observation and palpation is normal  EYES: Pupils round and reactive to light, Visual fields full to confrontation, Extraocular movements intacts,   MUSCULOSKELETAL: Gait, strength, tone, movements noted in Neurologic exam below  NEUROLOGIC: awake, alert, oriented to person, place and time recent and remote memory intact normal attention and concentration language fluent, comprehension intact, naming intact fund of knowledge appropriate  CRANIAL NERVE:  2nd, 3rd, 4th, 6th - pupils equal and reactive to light, visual fields full to confrontation, extraocular muscles intact, nystagmus present on left Dix-Hallpike  5th - facial sensation symmetric 7th - facial strength symmetric 8th - hearing intact 9th - palate elevates symmetrically, uvula midline 11th - shoulder shrug symmetric 12th - tongue protrusion midline  MOTOR:  normal bulk and tone, full strength in the BUE, BLE  SENSORY:  normal and symmetric to light touch all 4 extremities  COORDINATION:  finger-nose-finger, fine finger movements, heel-to-shin normal  REFLEXES:  deep tendon reflexes present and symmetric  GAIT/STATION:  Normal  +Positive Dix-Hallpike on left +Saccades present on head impulse test Negative for skew deviation     DIAGNOSTIC DATA (LABS, IMAGING, TESTING) - I reviewed patient records, labs, notes, testing and imaging myself where available.  Lab Results  Component Value Date   WBC  9.7 11/06/2020   HGB 14.4 11/06/2020   HCT 41.5 11/06/2020   MCV 89 11/06/2020   PLT 362 11/06/2020      Component Value Date/Time   NA 138 11/06/2020 1231   K 3.8 11/06/2020 1231   CL 98 11/06/2020 1231   CO2 25 11/06/2020 1231   GLUCOSE 121 (H) 11/06/2020 1231   GLUCOSE 104 (H) 06/03/2020 1425   BUN 16 11/06/2020 1231   CREATININE 0.66 11/06/2020 1231   CREATININE 0.78 09/26/2019 0812   CREATININE 0.71 05/02/2015 1000   CALCIUM 10.0 11/06/2020 1231   PROT 7.5 06/03/2020 1425   ALBUMIN 4.0 06/03/2020 1425   AST 24 06/03/2020 1425   AST 17 09/26/2019 0812   ALT 22 06/03/2020 1425   ALT 18 09/26/2019 0812   ALKPHOS 65 06/03/2020 1425   BILITOT 0.5 06/03/2020 1425   BILITOT 0.5 09/26/2019 0812   GFRNONAA >60 06/03/2020 1425   GFRNONAA >60 09/26/2019 0812   GFRAA >60 12/22/2019 2252   GFRAA >60 09/26/2019 0812   Lab Results  Component Value Date   CHOL 238 (H) 01/13/2013   HDL 48 01/13/2013   LDLCALC 141 (H) 01/13/2013   TRIG 247 (H) 01/13/2013   CHOLHDL 5.0 01/13/2013   Lab Results  Component Value Date   HGBA1C 6.1 (H) 08/02/2017  No results found for: VITAMINB12 Lab Results  Component Value Date   TSH 1.244 01/22/2016       ASSESSMENT AND PLAN  65 y.o. year old female with a history of HLD, HTN, asthma, DM2, OSA, afib on coumadin who presents for evaluation of vertigo over the past 5 weeks. Her clinical presentation and exam are consistent with BPPV. Referral to vestibular therapy placed for vertigo management. Referral to ENT placed for evaluation as she also reports significant sinus pressure associated with her symptoms. Advised her to let her cardiologist know about the palpitations she has been having to see if any further cardiac testing is needed.   1. Vertigo       PLAN: -Vestibular Rehab -ENT referral -Zofran as needed -consider repeat imaging if vertigo becomes constant/not affected by change in position  Orders Placed This Encounter   Procedures   Ambulatory referral to ENT   PT vestibular rehab    No orders of the defined types were placed in this encounter.   Return if symptoms worsen or fail to improve.    Genia Harold, MD  [facility] [address], [place], [place] [address] [phone]

## 2020-12-31 ENCOUNTER — Telehealth: Payer: Self-pay | Admitting: Psychiatry

## 2020-12-31 NOTE — Telephone Encounter (Signed)
[facility] to [facility] ph # [phone]

## 2020-12-31 NOTE — Telephone Encounter (Signed)
Correction ph # [phone]

## 2021-01-02 ENCOUNTER — Telehealth: Payer: Self-pay | Admitting: Psychiatry

## 2021-01-02 ENCOUNTER — Ambulatory Visit (INDEPENDENT_AMBULATORY_CARE_PROVIDER_SITE_OTHER): Payer: Medicare Other | Admitting: *Deleted

## 2021-01-02 ENCOUNTER — Other Ambulatory Visit: Payer: Self-pay

## 2021-01-02 DIAGNOSIS — Z5181 Encounter for therapeutic drug level monitoring: Secondary | ICD-10-CM | POA: Diagnosis not present

## 2021-01-02 DIAGNOSIS — R42 Dizziness and giddiness: Secondary | ICD-10-CM

## 2021-01-02 DIAGNOSIS — I4891 Unspecified atrial fibrillation: Secondary | ICD-10-CM

## 2021-01-02 DIAGNOSIS — I480 Paroxysmal atrial fibrillation: Secondary | ICD-10-CM | POA: Diagnosis not present

## 2021-01-02 LAB — POCT INR: INR: 4.6 — AB (ref 2.0–3.0)

## 2021-01-02 NOTE — Telephone Encounter (Signed)
PT order sent next door to Neuro rehab. Patient will be called to schedule.

## 2021-01-02 NOTE — Telephone Encounter (Signed)
Pt called, have not heard from anyone to schedule rehab appt for vertigo. Would like a call from the nurse

## 2021-01-02 NOTE — Patient Instructions (Signed)
Description   Hold warfarin today and tomorrow, then continue taking Warfarin 4mg  daily except 6mg  on Tuesdays, Thursdays, and Saturday. Recheck INR in 2 weeks. Call Coumadin Clinic [phone] Main # [phone] with any questions

## 2021-01-02 NOTE — Telephone Encounter (Signed)
PT referral not able to be processed. I reordered it.  Patient lives in [place].

## 2021-01-08 ENCOUNTER — Ambulatory Visit: Payer: Medicare Other | Admitting: Podiatry

## 2021-01-16 ENCOUNTER — Other Ambulatory Visit: Payer: Self-pay

## 2021-01-16 ENCOUNTER — Ambulatory Visit (INDEPENDENT_AMBULATORY_CARE_PROVIDER_SITE_OTHER): Payer: Medicare Other

## 2021-01-16 DIAGNOSIS — I480 Paroxysmal atrial fibrillation: Secondary | ICD-10-CM | POA: Diagnosis not present

## 2021-01-16 DIAGNOSIS — Z5181 Encounter for therapeutic drug level monitoring: Secondary | ICD-10-CM | POA: Diagnosis not present

## 2021-01-16 DIAGNOSIS — I4891 Unspecified atrial fibrillation: Secondary | ICD-10-CM

## 2021-01-16 LAB — POCT INR: INR: 2.4 (ref 2.0–3.0)

## 2021-01-16 NOTE — Patient Instructions (Signed)
Description   Continue taking Warfarin 4mg  daily except 6mg  on Tuesdays, Thursdays, and Saturdays. Recheck INR in 3 weeks. Call Coumadin Clinic [phone] Main # [phone] with any questions

## 2021-01-17 ENCOUNTER — Ambulatory Visit (INDEPENDENT_AMBULATORY_CARE_PROVIDER_SITE_OTHER): Payer: Medicare Other | Admitting: Podiatry

## 2021-01-17 ENCOUNTER — Encounter: Payer: Self-pay | Admitting: Podiatry

## 2021-01-17 DIAGNOSIS — M79674 Pain in right toe(s): Secondary | ICD-10-CM | POA: Diagnosis not present

## 2021-01-17 DIAGNOSIS — Z7901 Long term (current) use of anticoagulants: Secondary | ICD-10-CM

## 2021-01-17 DIAGNOSIS — B351 Tinea unguium: Secondary | ICD-10-CM | POA: Diagnosis not present

## 2021-01-17 DIAGNOSIS — D689 Coagulation defect, unspecified: Secondary | ICD-10-CM

## 2021-01-17 DIAGNOSIS — M79675 Pain in left toe(s): Secondary | ICD-10-CM | POA: Diagnosis not present

## 2021-01-17 NOTE — Progress Notes (Signed)
This patient returns to my office for at risk foot care.  This patient requires this care by a professional since this patient will be at risk due to having coagulation defect.  Patient is taking coumadin.  This patient is unable to cut nails herself since the patient cannot reach her nails.These nails are painful walking and wearing shoes.  This patient presents for at risk foot care today.  General Appearance  Alert, conversant and in no acute stress.  Vascular  Dorsalis pedis and posterior tibial  pulses are palpable  bilaterally.  Capillary return is within normal limits  bilaterally. Temperature is within normal limits  bilaterally.  Neurologic  Senn-Weinstein monofilament wire test within normal limits  bilaterally. Muscle power within normal limits bilaterally.  Nails Thick disfigured discolored nails with subungual debris  Second left and first and third right foot.  No evidence of bacterial infection or drainage bilaterally.  Orthopedic  No limitations of motion  feet .  No crepitus or effusions noted.  No bony pathology or digital deformities noted.  Skin  normotropic skin with no porokeratosis noted bilaterally.  No signs of infections or ulcers noted.     Onychomycosis  Pain in right toes  Pain in left toes  Consent was obtained for treatment procedures.   Mechanical debridement of nails 1-5  bilaterally performed with a nail nipper.  Filed with dremel without incident.    Return office visit   9 weeks                  Told patient to return for periodic foot care and evaluation due to potential at risk complications.   Deetra Booton DPM  

## 2021-01-23 ENCOUNTER — Ambulatory Visit (INDEPENDENT_AMBULATORY_CARE_PROVIDER_SITE_OTHER): Payer: Medicare Other | Admitting: Gastroenterology

## 2021-01-23 ENCOUNTER — Encounter: Payer: Self-pay | Admitting: Gastroenterology

## 2021-01-23 VITALS — BP 128/78 | HR 98 | Ht 64.0 in | Wt 231.4 lb

## 2021-01-23 DIAGNOSIS — K219 Gastro-esophageal reflux disease without esophagitis: Secondary | ICD-10-CM | POA: Diagnosis not present

## 2021-01-23 DIAGNOSIS — I480 Paroxysmal atrial fibrillation: Secondary | ICD-10-CM

## 2021-01-23 DIAGNOSIS — Z86010 Personal history of colonic polyps: Secondary | ICD-10-CM | POA: Diagnosis not present

## 2021-01-23 DIAGNOSIS — Z79899 Other long term (current) drug therapy: Secondary | ICD-10-CM

## 2021-01-23 DIAGNOSIS — R194 Change in bowel habit: Secondary | ICD-10-CM

## 2021-01-23 DIAGNOSIS — Z7901 Long term (current) use of anticoagulants: Secondary | ICD-10-CM

## 2021-01-23 MED ORDER — LINACLOTIDE 145 MCG PO CAPS: 145.0000 ug | ORAL_CAPSULE | Freq: Every day | ORAL | 0 refills | Status: DC

## 2021-01-23 MED ORDER — POLYETHYLENE GLYCOL 3350 17 G PO PACK: 17.0000 g | PACK | Freq: Two times a day (BID) | ORAL | 0 refills | Status: AC

## 2021-01-23 NOTE — Patient Instructions (Addendum)
If you are age 66 or older, your body mass index should be between 23-30. Your Body mass index is 39.72 kg/m. If this is out of the aforementioned range listed, please consider follow up with your Primary Care Provider.  If you are age 68 or younger, your body mass index should be between 19-25. Your Body mass index is 39.72 kg/m. If this is out of the aformentioned range listed, please consider follow up with your Primary Care Provider.   ________________________________________________________  The [facility] providers would like to encourage you to use [facility] to communicate with providers for non-urgent requests or questions.  Due to long hold times on the telephone, sending your provider a message by [facility] may be a faster and more efficient way to get a response.  Please allow 48 business hours for a response.  Please remember that this is for non-urgent requests.  _______________________________________________________   It has been recommended to you by your physician that you have a(n) colonoscopy completed once you have been cleared by Cardiology. At that time we will need to request approval for you to hold Coumadin for 5 days prior to your procedure. Please contact our office at [phone] when you get clearance to have the procedure. You will be scheduled for a pre-visit and procedure at that time.  You can take Bentyl as needed.  Please take Miralax as directed twice a day and increase or decrease as needed to achieve desired results. If this is not effective, please try Linzess.  We have given you samples of the following medication to take: Linzess 145 mcg: Take once daily before breakfast Lot: [id number], Exp: 08-2021  Thank you for entrusting me with your care and for choosing [facility], Dr. Canavanas Cellar

## 2021-01-23 NOTE — Progress Notes (Signed)
HPI :  65 year old female here for a follow-up visit to discuss surveillance colonoscopy, altered bowel habits, history of GERD.  She was last seen here in August 2020.  Her last colonoscopy with me was in January 2019.  At that point time she had 5 polyps removed, 3 of them were adenomatous, along with diverticulosis in her left colon and internal hemorrhoids.  We had recommended a repeat colonoscopy in 3 years.  Unfortunately due to recurrent diverticulosis in the left colon she had a sigmoid resection in May 2019, she recovered from that okay.  She states she has had some ongoing altered bowel habits.  She states that she feels quite constipated at times and has a hard time relieving herself, and then will have multiple bowel movements in a short period of time they can be difficult to deal with.  Stools are mostly formed, no diarrhea.  She denies any blood in her stools.  She has some cramping in her lower abdomen and lower back when she has the urge to have a bowel movement that is relieved with a bowel movement.  She takes Bentyl as needed for discomfort which helps her.  She previously had tried some Benefiber and Citrucel without much relief.  She tried MiraLAX once daily dosing which did not help so much.  She has been using Dulcolax and aloe vera juice more recently which she states helps a little bit.  She overall does not feel like she is evacuating herself as well as she could.  She does have a history of longstanding GERD.  She uses omeprazole 40 mg once daily and for the most part that works pretty well to control her symptoms.  At times she will have days that symptoms are more prominent and she will increase the omeprazole to twice daily as needed.  Generally she is doing pretty well in this regard.  She does have a history of osteopenia and we discussed long-term risks of Protonix.  She has had EGD in recent years without Barrett's esophagus but has a history of benign polyps in her  stomach.  Otherwise she has a history of DVT as well as A. fib, currently on Coumadin.  She states this had been well controlled however more recently having palpitations and sense that her heart can be racing at times.  She is scheduled to see Dr. Rayann Heman next week.  She denies any chest pain but has some exertional dyspnea.   Prior workup: Colonoscopy 04/06/2017 - transverse polyps x 2 cold snare, transverse polyps x 3 cold forceps, sigmoid polyp - path shows 3 adenomas total, diverticulosis in left colon, internal hemorrhoids, recall colonoscopy 3 years  EGD 06/15/2017 -  - A 3 cm hiatal hernia was present. - The exam of the esophagus was otherwise normal. No stenosis / stricture noted. - Two polyps - 5-56mm each in close proximity to each other, cummulative 10 mm in size or so, sessile ulcerated / inflamed polyp was found in the cardia. Biopsy was taken at the base / nonulcerated area for histology. - Multiple 3 to 15 mm sessile polyps were found in the entire examined stomach. Some were erythematous / inflamed. Biopsies of the largest polyp were taken with a cold forceps for histology. - The exam of the stomach was otherwise normal. - The duodenal bulb and second portion of the duodenum were normal.   1. Surgical [P], gastric body polyp - FUNDIC GLAND POLYP. - THERE IS NO EVIDENCE OF MALIGNANCY. 2. Surgical [  P], gastric cardia - FUNDIC GLAND POLYP. - THERE IS NO EVIDENCE OF MALIGNANCY.   EGD 07/19/2017 - Esophagogastric landmarks identified. - 3 cm hiatal hernia. - Normal esophagus - Three gastric polyps in the cardia / hernia sac, ulcerated / inflamed. Injected with epinephrine and then resected and retrieved. 3 hemostasis clips were placed. - Multiple large gastric body polyps. 3 representative samples resected and retrieved. - Normal duodenal bulb and second portion of the duodenum.  1. Stomach, polyp(s), body - FUNDIC GLAND POLYPS. - NO INTESTINAL METAPLASIA, DYSPLASIA, OR  MALIGNANCY. 2. Stomach, polyp(s), cardia - HYPERPLASTIC POLYP. - NO INTESTINAL METAPLASIA, DYSPLASIA, OR MALIGNANCY  Last echocardiogram shows EF of 65-70%   Past Medical History:  Diagnosis Date   Allergic rhinitis    Arthritis    "back" (05/07/2016)   Asthma    Atrial flutter ([place])    a. Remotely per notes.   Carpal tunnel syndrome, bilateral    Colon polyps    Diverticulitis    Dizziness    DVT (deep venous thrombosis) ([place]) 1980s x2 -    between birth of two sons. Saw hematology - was told she has a hypercoagulable disorder, should be on Coumadin lifelong.   Dysrhythmia    atrial fib   Family history of adverse reaction to anesthesia    "mother gets PONV"   Gallbladder disease    GERD (gastroesophageal reflux disease)    GIB (gastrointestinal bleeding) 04/14/2018   Halothane adverse reaction    narrow small opening   Heart murmur    Hiatal hernia    History of hiatal hernia    "small one/CTA 05/01/2016" (05/07/2016)   HTN (hypertension)    Hypercholesteremia    hx; "brought it down w/diet" (05/07/2016)   Hyperglycemia    a. A1c 6.1 in 2014.   Inguinal hernia    Iron deficiency anemia due to chronic blood loss 04/14/2018   Iron malabsorption 04/14/2018   Left sided sciatica    Normal cardiac stress test 06/2012   Obesity    OSA (obstructive sleep apnea)    a. Mild, did not tolerate CPAP (05/07/2016)   PAF (paroxysmal atrial fibrillation) ([place])    a. s/p afib ablation at [facility] 2010 (Dr. Annabell Howells). b. Recurrent AF s/p DCCV 06/2010. c. On flecainide.    Paroxysmal atrial fibrillation ([place]) 09/09/2009   Qualifier: Diagnosis of  By: Selena Batten CMA, [place]     PONV (postoperative nausea and vomiting)    PPD positive, treated 1977   "treated for 1 yr after exposure to patient"   Pre-diabetes    RSV infection ~ 2006   Vertigo    Wandering (atrial) pacemaker    a. Remotely per notes.  (  pt. states has a wandering p wave ) no pacemaker     Past Surgical History:   Procedure Laterality Date   ANKLE ARTHODESIS W/ ARTHROSCOPY Right 11/12/2020   ATRIAL ABLATION SURGERY  05/07/2016   "fib and flutter"   ATRIAL FIBRILLATION ABLATION  2010   at [place] N/A 05/07/2016   Procedure: Atrial Fibrillation Ablation;  Surgeon: Thompson Grayer, MD;  Location: [facility];  Service: Cardiovascular;  Laterality: N/A;   BREAST CYST ASPIRATION Bilateral    CARDIAC CATHETERIZATION  2008   CARPAL TUNNEL RELEASE Bilateral    CESAREAN SECTION  1986; 1988   COLONOSCOPY  X 2   COLONOSCOPY W/ BIOPSIES AND POLYPECTOMY  X 2   CYSTOSCOPY/URETEROSCOPY/HOLMIUM LASER/STENT PLACEMENT Left 12/23/2019  Procedure: CYSTOSCOPY/URETEROSCOPY/HOLMIUM LASER/STENT PLACEMENT;  Surgeon: Robley Fries, MD;  Location: [facility];  Service: Urology;  Laterality: Left;   ESOPHAGOGASTRODUODENOSCOPY     ESOPHAGOGASTRODUODENOSCOPY (EGD) WITH PROPOFOL N/A 07/19/2017   Procedure: ESOPHAGOGASTRODUODENOSCOPY (EGD) WITH PROPOFOL;  Surgeon: Yetta Flock, MD;  Location: [facility];  Service: Gastroenterology;  Laterality: N/A;   INGUINAL HERNIA REPAIR Right    LAPAROSCOPIC CHOLECYSTECTOMY  1989   POLYPECTOMY N/A 07/19/2017   Procedure: POLYPECTOMY;  Surgeon: Yetta Flock, MD;  Location: [facility];  Service: Gastroenterology;  Laterality: N/A;   PROCTOSCOPY N/A 08/06/2017   Procedure: PROCTOSCOPY;  Surgeon: Michael Boston, MD;  Location: [facility];  Service: General;  Laterality: N/A;   TMJ ARTHROPLASTY     TUBAL LIGATION  1988   Family History  Problem Relation Age of Onset   Hypertension Mother    Stroke Mother    Heart attack Mother        2 previous MIs, 3 stents - CAD first diagnosed 38   Thyroid disease Mother    Hyperlipidemia Father    Colon polyps Father    Lupus Sister    Thyroid disease Sister        x 2   Hyperlipidemia Maternal Grandmother    Heart attack Maternal Grandmother    Diabetes Maternal Grandmother     Hypertension Maternal Grandmother    Hypertension Maternal Grandfather    Stroke Maternal Grandfather        Multiple family members   Stroke Paternal Grandmother    Heart attack Paternal Grandfather        Died at 54 of massive MI   Colon cancer Neg Hx    Stomach cancer Neg Hx    Social History   Tobacco Use   Smoking status: Never   Smokeless tobacco: Never  Vaping Use   Vaping Use: Never used  Substance Use Topics   Alcohol use: Not Currently    Comment: rare   Drug use: No   Current Outpatient Medications  Medication Sig Dispense Refill   acetaminophen (TYLENOL) 650 MG CR tablet Take 1,300 mg by mouth every 8 (eight) hours as needed for pain.      cetirizine (ZYRTEC) 10 MG tablet Take 10 mg by mouth daily.     Cholecalciferol (VITAMIN D3) 1000 UNITS CAPS Take 2,000 Units by mouth daily.     dicyclomine (BENTYL) 10 MG capsule TAKE 1 CAPSULE BY MOUTH 30 MINS BEFORE MEALS AND AT BEDTIME AS NEEDED FOR PAIN/CRAMPING 360 capsule 1   ezetimibe (ZETIA) 10 MG tablet Take 1 tablet (10 mg total) by mouth daily. 90 tablet 3   FLOVENT HFA 220 MCG/ACT inhaler Inhale 1 puff into the lungs 2 (two) times daily.  1   fluticasone (FLONASE) 50 MCG/ACT nasal spray Place 1 spray into both nostrils 2 (two) times daily.      losartan (COZAAR) 25 MG tablet Take 1 tablet (25 mg total) by mouth daily. 90 tablet 0   meclizine (ANTIVERT) 25 MG tablet Take 1 tablet (25 mg total) by mouth 3 (three) times daily as needed for dizziness. 30 tablet 0   Multiple Vitamin (MULTIVITAMIN WITH MINERALS) TABS tablet Take 1 tablet by mouth daily.     omeprazole (PRILOSEC) 40 MG capsule Take 1 capsule (40 mg total) by mouth 2 (two) times daily. Must have an office visit for any further refills. Thank you 180 capsule 0   PROAIR HFA 108 (90 BASE) MCG/ACT inhaler Inhale 2 puffs into the  lungs every 6 (six) hours as needed for wheezing or shortness of breath.      venlafaxine XR (EFFEXOR-XR) 37.5 MG 24 hr capsule Take 37.5  mg by mouth daily with breakfast.      verapamil (CALAN) 80 MG tablet Take 160 mg by mouth 2 (two) times daily.     warfarin (COUMADIN) 2 MG tablet TAKE AS DIRECTED BY COUMADIN CLINIC 225 tablet 1   No current facility-administered medications for this visit.   Allergies  Allergen Reactions   Aspirin Other (See Comments)    Makes asthma worse and makes stomach hurt   Banana Swelling and Cough    Inside of the mouth swells   Food Other (See Comments) and Cough    Nuts- mucus membranes inflamed/cough    Ivp Dye [Iodinated Diagnostic Agents] Anaphylaxis    Required epinephrine. Since then, has tolerated with premed.   Latex Other (See Comments) and Cough    Wheezing/cough    Meperidine Hcl Other (See Comments)    Projectile vomiting    Peanut-Containing Drug Products Swelling and Cough    Inside of the mouth swells   Penicillins Anaphylaxis    Has patient had a PCN reaction causing immediate rash, facial/tongue/throat swelling, SOB or lightheadedness with hypotension:Yes Has patient had a PCN reaction causing severe rash involving mucus membranes or skin necrosis:Yes Has patient had a PCN reaction that required hospitalization:Treated in ER w/epi & breathing treatments Has patient had a PCN reaction occurring within the last 10 years:No If all of the above answers are "NO", then may proceed with Cephalosporin use.    Statins Other (See Comments)    Severe leg pain - has tried most of them   Sulfonamide Derivatives Anaphylaxis   Tape Hives and Dermatitis    No plastic tape!!   Alfuzosin Other (See Comments)    Headache, dizziness   Other     Vascepa- upset stomach   Sulfamethoxazole Hives     Review of Systems: All systems reviewed and negative except where noted in HPI.   Lab Results  Component Value Date   WBC 9.7 11/06/2020   HGB 14.4 11/06/2020   HCT 41.5 11/06/2020   MCV 89 11/06/2020   PLT 362 11/06/2020    Lab Results  Component Value Date   CREATININE  0.66 11/06/2020   BUN 16 11/06/2020   NA 138 11/06/2020   K 3.8 11/06/2020   CL 98 11/06/2020   CO2 25 11/06/2020    Lab Results  Component Value Date   ALT 22 06/03/2020   AST 24 06/03/2020   ALKPHOS 65 06/03/2020   BILITOT 0.5 06/03/2020    Physical Exam: BP 128/78   Pulse 98   Ht 5\' 4"  (1.626 m)   Wt 231 lb 6 oz (105 kg)   SpO2 97%   BMI 39.72 kg/m  Constitutional: Pleasant,well-developed, female in no acute distress.  Cardiovascular: Normal rate, regular rhythm.  Neurological: Alert and oriented to person place and time. Psychiatric: Normal mood and affect. Behavior is normal.   ASSESSMENT AND PLAN: 65 year old female here for reassessment of following:  Altered bowel habits GERD Long-term use of PPI History of colon polyps Anticoagulated Atrial fibrillation  As above, patient is due for surveillance colonoscopy in light of history of colon polyps.  She has had some constipation/altered bowel habits in recent months that continue to bother her.  She is status post sigmoid resection for diverticulitis.  We discussed colonoscopy, risks and benefits of the exam  and she does wish to proceed with that, however given her symptoms I do feel that she should see her cardiologist first to make sure her atrial fibrillation is under good control.  If not and she wants further therapy or treatment for that then we will await that to be sorted out first prior to scheduling her colonoscopy.  She will contact us when she is ready to schedule after she has had that evaluated, we will touch base with her in about a month or so to see how she is doing.  When she is cleared to have a colonoscopy she will need to hold Coumadin for 5 days.  Regarding her bowel habits we discussed options.  She can try increasing her MiraLAX to twice daily dosing and additionally titrate up as needed.  As she would prefer to try something else I gave her some samples of Linzess today, 145 mcg tab, to use daily  as needed.  If she wants a prescription for this she will contact me.  Otherwise she will continue Bentyl as needed which does help her in regards to her abdominal cramping.  We otherwise discussed her history of reflux and her long-term use of PPI.  We discussed potential increased risks associated with long-term use.  She does have osteopenia, she understands there is increased risk for bone fracture with chronic PPI but she does not think she can go to a lower dose at this time given periodic breakthrough.  She will try to use lowest dose needed to control symptoms over time.  Plan: - colonoscopy once cleared by cardiology and undergoes further evaluation for her atrial fibrillation given her symptoms, she is seeing them next week - will need to hold coumadin for 5 days pre-colonoscopy - continue bentyl PRN - would resume Miralax, can take it twice daily and titrate up as needed. If she would prefer a different regimen I gave her some samples of Linzess 130mcg and if she likes this will let me know if she wants a prescription - long term use lowest dose of PPI needed to control symptoms  Jolly Mango, MD [facility]

## 2021-01-27 ENCOUNTER — Other Ambulatory Visit: Payer: Self-pay

## 2021-01-27 ENCOUNTER — Encounter: Payer: Self-pay | Admitting: Internal Medicine

## 2021-01-27 ENCOUNTER — Ambulatory Visit (INDEPENDENT_AMBULATORY_CARE_PROVIDER_SITE_OTHER): Payer: Medicare Other | Admitting: Internal Medicine

## 2021-01-27 VITALS — BP 128/72 | HR 91 | Ht 64.0 in | Wt 232.6 lb

## 2021-01-27 DIAGNOSIS — G4733 Obstructive sleep apnea (adult) (pediatric): Secondary | ICD-10-CM

## 2021-01-27 DIAGNOSIS — I10 Essential (primary) hypertension: Secondary | ICD-10-CM

## 2021-01-27 DIAGNOSIS — I480 Paroxysmal atrial fibrillation: Secondary | ICD-10-CM

## 2021-01-27 LAB — CBC
Hematocrit: 41.6 % (ref 34.0–46.6)
Hemoglobin: 14.2 g/dL (ref 11.1–15.9)
MCH: 31.1 pg (ref 26.6–33.0)
MCHC: 34.1 g/dL (ref 31.5–35.7)
MCV: 91 fL (ref 79–97)
Platelets: 366 10*3/uL (ref 150–450)
RBC: 4.56 x10E6/uL (ref 3.77–5.28)
RDW: 12.5 % (ref 11.7–15.4)
WBC: 6.1 10*3/uL (ref 3.4–10.8)

## 2021-01-27 LAB — BASIC METABOLIC PANEL
BUN/Creatinine Ratio: 17 (ref 12–28)
BUN: 10 mg/dL (ref 8–27)
CO2: 23 mmol/L (ref 20–29)
Calcium: 9.8 mg/dL (ref 8.7–10.3)
Chloride: 99 mmol/L (ref 96–106)
Creatinine, Ser: 0.6 mg/dL (ref 0.57–1.00)
Glucose: 204 mg/dL — ABNORMAL HIGH (ref 70–99)
Potassium: 3.8 mmol/L (ref 3.5–5.2)
Sodium: 139 mmol/L (ref 134–144)
eGFR: 99 mL/min/{1.73_m2} (ref >59)

## 2021-01-27 LAB — TSH: TSH: 1.17 u[IU]/mL (ref 0.450–4.500)

## 2021-01-27 MED ORDER — VERAPAMIL HCL 80 MG PO TABS: ORAL_TABLET | ORAL | 3 refills | Status: DC

## 2021-01-27 NOTE — Patient Instructions (Addendum)
Medication Instructions:  Reduce Verapamil to 80 mg in the morning and 160 mg in the evening.  Your physician recommends that you continue on your current medications as directed. Please refer to the Current Medication list given to you today. *If you need a refill on your cardiac medications before your next appointment, please call your pharmacy*  Lab Work: TSH, BMP, CBC If you have labs (blood work) drawn today and your tests are completely normal, you will receive your results only by: [place] (if you have MyChart) OR A paper copy in the mail If you have any lab test that is abnormal or we need to change your treatment, we will call you to review the results.  Testing/Procedures: Your physician has requested that you have an echocardiogram. Echocardiography is a painless test that uses sound waves to create images of your heart. It provides your doctor with information about the size and shape of your heart and how well your heart's chambers and valves are working. This procedure takes approximately one hour. There are no restrictions for this procedure.   Follow-Up: At [facility], you and your health needs are our priority.  As part of our continuing mission to provide you with exceptional heart care, we have created designated Provider Care Teams.  These Care Teams include your primary Cardiologist (physician) and Advanced Practice Providers (APPs -  Physician Assistants and Nurse Practitioners) who all work together to provide you with the care you need, when you need it.  Your physician wants you to follow-up in:  05/12/21 at 9:45 am with  Thompson Grayer, MD   We recommend signing up for the patient portal called "MyChart".  Sign up information is provided on this After Visit Summary.  MyChart is used to connect with patients for Virtual Visits (Telemedicine).  Patients are able to view lab/test results, encounter notes, upcoming appointments, etc.  Non-urgent messages can be  sent to your provider as well.   To learn more about what you can do with MyChart, go to [email or web address].    Any Other Special Instructions Will Be Listed Below (If Applicable).  Increase your Hydration

## 2021-01-27 NOTE — Progress Notes (Signed)
PCP: Antony Contras, MD   Primary EP: Dr Loralie Champagne is a 65 y.o. female who presents today for routine electrophysiology followup.  Since last being seen in our clinic, the patient reports doing very well.  Today, she denies symptoms of palpitations, chest pain, shortness of breath,  lower extremity edema, dizziness, presyncope, or syncope.  The patient is otherwise without complaint today.   Past Medical History:  Diagnosis Date   Allergic rhinitis    Arthritis    "back" (05/07/2016)   Asthma    Atrial flutter ([place])    a. Remotely per notes.   Carpal tunnel syndrome, bilateral    Colon polyps    Diverticulitis    Dizziness    DVT (deep venous thrombosis) ([place]) 1980s x2 -    between birth of two sons. Saw hematology - was told she has a hypercoagulable disorder, should be on Coumadin lifelong.   Dysrhythmia    atrial fib   Family history of adverse reaction to anesthesia    "mother gets PONV"   Gallbladder disease    GERD (gastroesophageal reflux disease)    GIB (gastrointestinal bleeding) 04/14/2018   Halothane adverse reaction    narrow small opening   Heart murmur    Hiatal hernia    History of hiatal hernia    "small one/CTA 05/01/2016" (05/07/2016)   HTN (hypertension)    Hypercholesteremia    hx; "brought it down w/diet" (05/07/2016)   Hyperglycemia    a. A1c 6.1 in 2014.   Inguinal hernia    Iron deficiency anemia due to chronic blood loss 04/14/2018   Iron malabsorption 04/14/2018   Left sided sciatica    Normal cardiac stress test 06/2012   Obesity    OSA (obstructive sleep apnea)    a. Mild, did not tolerate CPAP (05/07/2016)   PAF (paroxysmal atrial fibrillation) ([place])    a. s/p afib ablation at [facility] 2010 (Dr. Annabell Howells). b. Recurrent AF s/p DCCV 06/2010. c. On flecainide.    Paroxysmal atrial fibrillation ([place]) 09/09/2009   Qualifier: Diagnosis of  By: Selena Batten CMA, [place]     PONV (postoperative nausea and vomiting)    PPD positive, treated  1977   "treated for 1 yr after exposure to patient"   Pre-diabetes    RSV infection ~ 2006   Vertigo    Wandering (atrial) pacemaker    a. Remotely per notes.  (  pt. states has a wandering p wave ) no pacemaker   Past Surgical History:  Procedure Laterality Date   ANKLE ARTHODESIS W/ ARTHROSCOPY Right 11/12/2020   ATRIAL ABLATION SURGERY  05/07/2016   "fib and flutter"   ATRIAL FIBRILLATION ABLATION  2010   at [place] N/A 05/07/2016   Procedure: Atrial Fibrillation Ablation;  Surgeon: Thompson Grayer, MD;  Location: [facility];  Service: Cardiovascular;  Laterality: N/A;   BREAST CYST ASPIRATION Bilateral    CARDIAC CATHETERIZATION  2008   CARPAL TUNNEL RELEASE Bilateral    CESAREAN SECTION  1986; 1988   COLONOSCOPY  X 2   COLONOSCOPY W/ BIOPSIES AND POLYPECTOMY  X 2   CYSTOSCOPY/URETEROSCOPY/HOLMIUM LASER/STENT PLACEMENT Left 12/23/2019   Procedure: CYSTOSCOPY/URETEROSCOPY/HOLMIUM LASER/STENT PLACEMENT;  Surgeon: Robley Fries, MD;  [facility];  Service: Urology;  Laterality: Left;   ESOPHAGOGASTRODUODENOSCOPY     ESOPHAGOGASTRODUODENOSCOPY (EGD) WITH PROPOFOL N/A 07/19/2017   Procedure: ESOPHAGOGASTRODUODENOSCOPY (EGD) WITH PROPOFOL;  Surgeon: Yetta Flock, MD;  [facility];  Service: Gastroenterology;  Laterality: N/A;   INGUINAL HERNIA REPAIR Right    LAPAROSCOPIC CHOLECYSTECTOMY  1989   POLYPECTOMY N/A 07/19/2017   Procedure: POLYPECTOMY;  Surgeon: Yetta Flock, MD;  [facility];  Service: Gastroenterology;  Laterality: N/A;   PROCTOSCOPY N/A 08/06/2017   Procedure: PROCTOSCOPY;  Surgeon: Michael Boston, MD;  [facility];  Service: General;  Laterality: N/A;   TMJ ARTHROPLASTY     TUBAL LIGATION  1988    ROS- all systems are reviewed and negatives except as per HPI above  Current Outpatient Medications  Medication Sig Dispense Refill   acetaminophen (TYLENOL) 650 MG CR tablet  Take 1,300 mg by mouth every 8 (eight) hours as needed for pain.      cetirizine (ZYRTEC) 10 MG tablet Take 10 mg by mouth daily.     Cholecalciferol (VITAMIN D3) 1000 UNITS CAPS Take 2,000 Units by mouth daily.     dicyclomine (BENTYL) 10 MG capsule TAKE 1 CAPSULE BY MOUTH 30 MINS BEFORE MEALS AND AT BEDTIME AS NEEDED FOR PAIN/CRAMPING 360 capsule 1   ezetimibe (ZETIA) 10 MG tablet Take 1 tablet (10 mg total) by mouth daily. 90 tablet 3   FLOVENT HFA 220 MCG/ACT inhaler Inhale 1 puff into the lungs 2 (two) times daily.  1   fluticasone (FLONASE) 50 MCG/ACT nasal spray Place 1 spray into both nostrils 2 (two) times daily.      linaclotide (LINZESS) 145 MCG CAPS capsule Take 1 capsule (145 mcg total) by mouth daily before breakfast. Exp: 08-2021, LOT: [id number] 8 capsule 0   losartan (COZAAR) 25 MG tablet Take 1 tablet (25 mg total) by mouth daily. 90 tablet 0   meclizine (ANTIVERT) 25 MG tablet Take 1 tablet (25 mg total) by mouth 3 (three) times daily as needed for dizziness. 30 tablet 0   Multiple Vitamin (MULTIVITAMIN WITH MINERALS) TABS tablet Take 1 tablet by mouth daily.     omeprazole (PRILOSEC) 40 MG capsule Take 1 capsule (40 mg total) by mouth 2 (two) times daily. Must have an office visit for any further refills. Thank you 180 capsule 0   polyethylene glycol (MIRALAX) 17 g packet Take 17 g by mouth 2 (two) times daily. Titrate as needed 14 each 0   PROAIR HFA 108 (90 BASE) MCG/ACT inhaler Inhale 2 puffs into the lungs every 6 (six) hours as needed for wheezing or shortness of breath.      venlafaxine XR (EFFEXOR-XR) 37.5 MG 24 hr capsule Take 37.5 mg by mouth daily with breakfast.      verapamil (CALAN) 80 MG tablet Take 160 mg by mouth 2 (two) times daily.     warfarin (COUMADIN) 2 MG tablet TAKE AS DIRECTED BY COUMADIN CLINIC 225 tablet 1   No current facility-administered medications for this visit.    Physical Exam: Vitals:   01/27/21 0854  BP: 128/72  Pulse: 91  SpO2: 95%   Weight: 232 lb 9.6 oz (105.5 kg)  Height: 5\' 4"  (1.626 m)   GEN- The patient is well appearing, alert and oriented x 3 today.   Head- normocephalic, atraumatic Eyes-  Sclera clear, conjunctiva pink Ears- hearing intact Oropharynx- clear Lungs- Clear to ausculation bilaterally, normal work of breathing Heart- Regular rate and rhythm, no murmurs, rubs or gallops, PMI not laterally displaced GI- soft, NT, ND, + BS Extremities- no clubbing, cyanosis, or edema Carotid massage maneuver- normal response today, without pauses created  Wt Readings from Last 3 Encounters:  01/27/21 232 lb 9.6 oz (105.5 kg)  01/23/21 231 lb 6 oz (105 kg)  12/26/20 234 lb (106.1 kg)    EKG tracing ordered today is personally reviewed and shows sinus rhythm 91 bpm, Qtc 455 msec  Assessment and Plan:  Atrial fibrillation Well controlled post ablation Chads2vasc score is 4  She is on coumadin.  She has had prior DVT She has had some palptiations.  Review of her Jodelle Red does not show afib.  I do not see arrhythmias. Today, I will obtain cbc, bmet, tsh, and an echo  2. Dizziness History suggests vertigo Carotid sinus massage maneuver was normal today. Continue management as per neurology Bmet, cbc and echo ordered Hydration advised Could consider ILR vs monitoring if further syncope occurs I have reviewed her Kardia strips at length and do not see worrisome features.  3. HTN Stable No change required today  4. OSA Compliance with therapy is advised  Risks, benefits and potential toxicities for medications prescribed and/or refilled reviewed with patient today.   Return to see me in 3 months  Thompson Grayer MD, [facility] 01/27/2021 9:00 AM

## 2021-01-31 ENCOUNTER — Other Ambulatory Visit: Payer: Self-pay

## 2021-01-31 ENCOUNTER — Ambulatory Visit: Payer: Medicare Other | Attending: Psychiatry | Admitting: Rehabilitative and Restorative Service Providers"

## 2021-01-31 DIAGNOSIS — R42 Dizziness and giddiness: Secondary | ICD-10-CM | POA: Diagnosis present

## 2021-01-31 DIAGNOSIS — H8112 Benign paroxysmal vertigo, left ear: Secondary | ICD-10-CM | POA: Diagnosis present

## 2021-01-31 NOTE — Patient Instructions (Signed)
Access Code: [email or web address] URL: [email or web address] Date: 01/31/2021 Prepared by: Rudell Cobb  Exercises Standing Gaze Stabilization with Head Rotation - 2 x daily - 7 x weekly - 2 sets - 20 reps

## 2021-01-31 NOTE — Therapy (Signed)
[facility] [address] [place], [place], [address] Phone: [phone]   Fax:  [phone]  Physical Therapy Evaluation  Patient Details  Name: [name] MRN: [id number] Date of Birth: 04/28/1955 Referring Provider (PT): Genia Harold, MD   Encounter Date: 01/31/2021   PT End of Session - 01/31/21 2054     Visit Number 1    Number of Visits 4    Date for PT Re-Evaluation 03/02/21    Authorization Type medicare    Progress Note Due on Visit 10    PT Start Time 0845    PT Stop Time 0925    PT Time Calculation (min) 40 min             Past Medical History:  Diagnosis Date   Allergic rhinitis    Arthritis    "back" (05/07/2016)   Asthma    Atrial flutter ([place])    a. Remotely per notes.   Carpal tunnel syndrome, bilateral    Colon polyps    Diverticulitis    Dizziness    DVT (deep venous thrombosis) ([place]) 1980s x2 -    between birth of two sons. Saw hematology - was told she has a hypercoagulable disorder, should be on Coumadin lifelong.   Dysrhythmia    atrial fib   Family history of adverse reaction to anesthesia    "mother gets PONV"   Gallbladder disease    GERD (gastroesophageal reflux disease)    GIB (gastrointestinal bleeding) 04/14/2018   Halothane adverse reaction    narrow small opening   Heart murmur    Hiatal hernia    History of hiatal hernia    "small one/CTA 05/01/2016" (05/07/2016)   HTN (hypertension)    Hypercholesteremia    hx; "brought it down w/diet" (05/07/2016)   Hyperglycemia    a. A1c 6.1 in 2014.   Inguinal hernia    Iron deficiency anemia due to chronic blood loss 04/14/2018   Iron malabsorption 04/14/2018   Left sided sciatica    Normal cardiac stress test 06/2012   Obesity    OSA (obstructive sleep apnea)    a. Mild, did not tolerate CPAP (05/07/2016)   PAF (paroxysmal atrial fibrillation) ([place])    a. s/p afib ablation at [facility] 2010 (Dr. Annabell Howells). b. Recurrent  AF s/p DCCV 06/2010. c. On flecainide.    Paroxysmal atrial fibrillation ([place]) 09/09/2009   Qualifier: Diagnosis of  By: Selena Batten CMA, [place]     PONV (postoperative nausea and vomiting)    PPD positive, treated 1977   "treated for 1 yr after exposure to patient"   Pre-diabetes    RSV infection ~ 2006   Vertigo    Wandering (atrial) pacemaker    a. Remotely per notes.  (  pt. states has a wandering p wave ) no pacemaker    Past Surgical History:  Procedure Laterality Date   ANKLE ARTHODESIS W/ ARTHROSCOPY Right 11/12/2020   ATRIAL ABLATION SURGERY  05/07/2016   "fib and flutter"   ATRIAL FIBRILLATION ABLATION  2010   at [place] N/A 05/07/2016   Procedure: Atrial Fibrillation Ablation;  Surgeon: Thompson Grayer, MD;  Location: [facility];  Service: Cardiovascular;  Laterality: N/A;   BREAST CYST ASPIRATION Bilateral    CARDIAC CATHETERIZATION  2008   CARPAL TUNNEL RELEASE Bilateral    CESAREAN SECTION  1986; 1988   COLONOSCOPY  X 2   COLONOSCOPY W/  BIOPSIES AND POLYPECTOMY  X 2   CYSTOSCOPY/URETEROSCOPY/HOLMIUM LASER/STENT PLACEMENT Left 12/23/2019   Procedure: CYSTOSCOPY/URETEROSCOPY/HOLMIUM LASER/STENT PLACEMENT;  Surgeon: Robley Fries, MD;  [facility];  Service: Urology;  Laterality: Left;   ESOPHAGOGASTRODUODENOSCOPY     ESOPHAGOGASTRODUODENOSCOPY (EGD) WITH PROPOFOL N/A 07/19/2017   Procedure: ESOPHAGOGASTRODUODENOSCOPY (EGD) WITH PROPOFOL;  Surgeon: Yetta Flock, MD;  [facility];  Service: Gastroenterology;  Laterality: N/A;   INGUINAL HERNIA REPAIR Right    LAPAROSCOPIC CHOLECYSTECTOMY  1989   POLYPECTOMY N/A 07/19/2017   Procedure: POLYPECTOMY;  Surgeon: Yetta Flock, MD;  [facility];  Service: Gastroenterology;  Laterality: N/A;   PROCTOSCOPY N/A 08/06/2017   Procedure: PROCTOSCOPY;  Surgeon: Michael Boston, MD;  [facility];  Service: General;  Laterality: N/A;   TMJ  ARTHROPLASTY     TUBAL LIGATION  1988    There were no vitals filed for this visit.    Subjective Assessment - 01/31/21 0844     Subjective The patient reports ongoing dizziness that has led to falls with injury over the past few months--she describes a sensation of lightheadedness.  Symptoms worsened and she began to experience more of a spinning sensation that lasts for seconds and is intense in nature.  She reports no falls in the past 6 months.  She has been recovering from her ankle surgery over the last few months.  Symptoms are worse with tilting her head forward, lying down to the left side.    Pertinent History h/o prior vertigo with R BPPV, HLD, HTN, asthma, DM2, OSA and a-fib on coumadin, mild concussion after falling off a ladder 4 years ago    Patient Stated Goals reduce spinning sensation and reduce lightheaded sensation    Currently in Pain? Yes    Pain Location --   ankle and low back pain   Effect of Pain on Daily Activities *PT to monitor response to treatment, but no goals to follow.                [facility] PT Assessment - 01/31/21 0851       Assessment   Medical Diagnosis Vertigo    Referring Provider (PT) Genia Harold, MD    Onset Date/Surgical Date 12/26/20    Hand Dominance Right    Prior Therapy has been treated by ENT years ago for R bppv      Precautions   Precautions Fall    Required Braces or Orthoses --   wearing R ankle brace     Restrictions   Weight Bearing Restrictions No      Balance Screen   Has the patient fallen in the past 6 months No    Has the patient had a decrease in activity level because of a fear of falling?  No    Is the patient reluctant to leave their home because of a fear of falling?  No      Home Environment   Living Environment Private residence    Living Arrangements Spouse/significant other    Home Access Level entry    [place] to live on main level with bedroom/bathroom   sewing room upstairs     Prior  Function   Level of Independence Independent      Observation/Other Assessments   Focus on Therapeutic Outcomes (FOTO)  n/a                    Vestibular Assessment - 01/31/21 9147  Symptom Behavior   Subjective history of current problem h/o chronic lightheadedness she associates with h/o arrhythmias    Type of Dizziness  Lightheadedness;Spinning    Frequency of Dizziness daily    Duration of Dizziness seconds    Symptom Nature Motion provoked;Positional    Aggravating Factors Activity in general;Turning head quickly;Rolling to left;Forward bending    Relieving Factors Head stationary    Progression of Symptoms Worse    History of similar episodes h/o R BPPV and h/o chronic imbalance/lightheadedness      Oculomotor Exam   Oculomotor Alignment Normal    Ocular ROM WFLs    Spontaneous Absent    Gaze-induced  Absent    Smooth Pursuits Intact    Saccades Intact      Vestibulo-Ocular Reflex   VOR 1 Head Only (x 1 viewing) Notes dizziness with slow, self regulated pace of VOR    Comment head impulse test=positive bilaterally for head impulse test.      Positional Testing   Dix-Hallpike Dix-Hallpike Right;Dix-Hallpike Left    Horizontal Canal Testing Horizontal Canal Right;Horizontal Canal Left      Dix-Hallpike Right   Dix-Hallpike Right Duration constant 7-8/10 while in position of dizziness, but no spinning    Dix-Hallpike Right Symptoms No nystagmus      Dix-Hallpike Left   Dix-Hallpike Left Duration 12 second    Dix-Hallpike Left Symptoms Upbeat, left rotatory nystagmus      Horizontal Canal Right   Horizontal Canal Right Duration none    Horizontal Canal Right Symptoms Normal      Horizontal Canal Left   Horizontal Canal Left Duration none    Horizontal Canal Left Symptoms Normal                Objective measurements completed on examination: See above findings.        Vestibular Treatment/Exercise - 01/31/21 0910       Vestibular  Treatment/Exercise   Vestibular Treatment Provided Canalith Repositioning;Gaze    Canalith Repositioning Epley Manuever Left    Gaze Exercises X1 Viewing Horizontal       EPLEY MANUEVER LEFT   Number of Reps  2    Overall Response  Improved Symptoms      X1 Viewing Horizontal   Foot Position seated    Comments 30 seconds                    PT Education - 01/31/21 2053     Education Details HEP    Person(s) Educated Patient    Methods Explanation;Demonstration;Handout    Comprehension Verbalized understanding;Returned demonstration                 PT Long Term Goals - 01/31/21 2055       PT LONG TERM GOAL #1   Title The patient will be indep with HEP for VOR x 1 viewing, habituation, and balance.    Time 4    Period Weeks    Target Date 03/02/21      PT LONG TERM GOAL #2   Title The patient will have negative positional testing indicating resolution of L BPPV.    Time 4    Period Weeks    Target Date 03/02/21      PT LONG TERM GOAL #3   Title The patient will tolerate gaze x 1 x 60 seconds without c/o dizziness.    Time 4    Period Weeks    Target Date 03/02/21  PT LONG TERM GOAL #4   Title The patient will report 40% improvement in symptoms of dizziness.    Time 4    Period Weeks    Target Date 03/02/21                    Plan - 01/31/21 2058     Clinical Impression Statement The patient is a 65 yo female referred to OP physical therapy for acute on chronic vertigo symptoms.  She has h/o general lightheaded sensation with recent onset of spinning.  At today's evaluation, she presents with impairments of L posterior canalithiasis (per + L dix hallpike), diminished VOR per positive bilat head impulse test, motion sensitivity, and reports of imbalance.  PT to address deficits to improve mobility and reduce risk for falls.    Personal Factors and Comorbidities Comorbidity 3+    Comorbidities HLD, HTN, asthma, DM2, a-fib on coumadin,  mild concussion after falling off a ladder 4 years ago    Examination-Activity Limitations Bed Mobility;Locomotion Level;Bend    Examination-Participation Restrictions Cleaning;Community Activity    Stability/Clinical Decision Making Stable/Uncomplicated    Clinical Decision Making Low    Rehab Potential Good    PT Frequency 1x / week    PT Duration 4 weeks    PT Treatment/Interventions ADLs/Self Care Home Management;Canalith Repostioning;Balance training;Vestibular;Gait training;Therapeutic activities;Therapeutic exercise;Patient/family education    PT Next Visit Plan reassess for positional symptoms, progress gaze, progress motion sensitivity, balance (corner) for HEP    Consulted and Agree with Plan of Care Patient             Patient will benefit from skilled therapeutic intervention in order to improve the following deficits and impairments:  Dizziness, Decreased activity tolerance, Decreased balance  Visit Diagnosis: BPPV (benign paroxysmal positional vertigo), left  Dizziness and giddiness     Problem List Patient Active Problem List   Diagnosis Date Noted   Allergic rhinitis 04/17/2020   Chronic sinusitis 04/17/2020   Edema 04/17/2020   Mixed hyperlipidemia 04/17/2020   Morbid obesity ([place]) 04/17/2020   Sciatica 04/17/2020   Skin sensation disturbance 04/17/2020   Thrombophilia ([place]) 04/17/2020   Type 2 diabetes mellitus with other specified complication ([place]) 89/21/1941   Abdominal pain, left lower quadrant 10/27/2018   Loose stools 10/27/2018   Iron deficiency anemia due to chronic blood loss 04/14/2018   Iron malabsorption 04/14/2018   GIB (gastrointestinal bleeding) 04/14/2018   Chronic anticoagulation (warfarin for Afib) 08/06/2017   Gastric polyp    Fall 07/08/2017   Scalp laceration 07/08/2017   Hypokalemia 07/08/2017   Numbness of hand 06/07/2017   Recurrent diverticulitis s/p robotic sigmoid colectomy 08/06/2017 11/24/2016   Right hip pain  05/14/2015   Obesity (BMI 30-39.9) 01/10/2014   Sleep apnea 01/10/2014   Encounter for therapeutic drug monitoring 04/26/2013   Essential hypertension 03/01/2013   History of DVT (deep vein thrombosis) 01/12/2013   Hyperglycemia 01/12/2013   Dizziness 07/09/2010   HYPERCHOLESTEROLEMIA 09/10/2009   Carpal tunnel syndrome 09/10/2009   Asthma 09/10/2009   TMJ SYNDROME 09/10/2009   GERD 09/10/2009   GALLBLADDER DISEASE 09/10/2009   Paroxysmal atrial fibrillation ([place]) 09/09/2009    [place], PT 01/31/2021, 9:01 PM  [facility] [address] [place], [place], [address] Phone: [phone]   Fax:  [phone]  Name: [name] MRN: [id number] Date of Birth: December 27, 1955

## 2021-02-03 ENCOUNTER — Ambulatory Visit: Payer: Medicare Other | Admitting: Rehabilitative and Restorative Service Providers"

## 2021-02-03 ENCOUNTER — Other Ambulatory Visit: Payer: Self-pay

## 2021-02-03 ENCOUNTER — Encounter: Payer: Self-pay | Admitting: Rehabilitative and Restorative Service Providers"

## 2021-02-03 DIAGNOSIS — H8112 Benign paroxysmal vertigo, left ear: Secondary | ICD-10-CM

## 2021-02-03 DIAGNOSIS — R42 Dizziness and giddiness: Secondary | ICD-10-CM

## 2021-02-03 NOTE — Therapy (Signed)
[facility] [address], [place], [place], [address] Phone: [phone]   Fax:  [phone]  Physical Therapy Treatment  Patient Details  Name: [name] MRN: [id number] Date of Birth: 1955-04-29 Referring Provider (PT): Genia Harold, MD   Encounter Date: 02/03/2021   PT End of Session - 02/03/21 1406     Visit Number 2    Number of Visits 4    Date for PT Re-Evaluation 03/02/21    Authorization Type medicare    Progress Note Due on Visit 10    PT Start Time 1324    PT Stop Time 1440    PT Time Calculation (min) 38 min             Past Medical History:  Diagnosis Date   Allergic rhinitis    Arthritis    "back" (05/07/2016)   Asthma    Atrial flutter ([place])    a. Remotely per notes.   Carpal tunnel syndrome, bilateral    Colon polyps    Diverticulitis    Dizziness    DVT (deep venous thrombosis) ([place]) 1980s x2 -    between birth of two sons. Saw hematology - was told she has a hypercoagulable disorder, should be on Coumadin lifelong.   Dysrhythmia    atrial fib   Family history of adverse reaction to anesthesia    "mother gets PONV"   Gallbladder disease    GERD (gastroesophageal reflux disease)    GIB (gastrointestinal bleeding) 04/14/2018   Halothane adverse reaction    narrow small opening   Heart murmur    Hiatal hernia    History of hiatal hernia    "small one/CTA 05/01/2016" (05/07/2016)   HTN (hypertension)    Hypercholesteremia    hx; "brought it down w/diet" (05/07/2016)   Hyperglycemia    a. A1c 6.1 in 2014.   Inguinal hernia    Iron deficiency anemia due to chronic blood loss 04/14/2018   Iron malabsorption 04/14/2018   Left sided sciatica    Normal cardiac stress test 06/2012   Obesity    OSA (obstructive sleep apnea)    a. Mild, did not tolerate CPAP (05/07/2016)   PAF (paroxysmal atrial fibrillation) ([place])    a. s/p afib ablation at [facility] 2010 (Dr. Annabell Howells). b. Recurrent AF  s/p DCCV 06/2010. c. On flecainide.    Paroxysmal atrial fibrillation ([place]) 09/09/2009   Qualifier: Diagnosis of  By: Selena Batten CMA, Jewel     PONV (postoperative nausea and vomiting)    PPD positive, treated 1977   "treated for 1 yr after exposure to patient"   Pre-diabetes    RSV infection ~ 2006   Vertigo    Wandering (atrial) pacemaker    a. Remotely per notes.  (  pt. states has a wandering p wave ) no pacemaker    Past Surgical History:  Procedure Laterality Date   ANKLE ARTHODESIS W/ ARTHROSCOPY Right 11/12/2020   ATRIAL ABLATION SURGERY  05/07/2016   "fib and flutter"   ATRIAL FIBRILLATION ABLATION  2010   at [place] N/A 05/07/2016   Procedure: Atrial Fibrillation Ablation;  Surgeon: Thompson Grayer, MD;  Location: [facility];  Service: Cardiovascular;  Laterality: N/A;   BREAST CYST ASPIRATION Bilateral    CARDIAC CATHETERIZATION  2008   CARPAL TUNNEL RELEASE Bilateral    CESAREAN SECTION  1986; 1988   COLONOSCOPY  X 2   COLONOSCOPY W/  BIOPSIES AND POLYPECTOMY  X 2   CYSTOSCOPY/URETEROSCOPY/HOLMIUM LASER/STENT PLACEMENT Left 12/23/2019   Procedure: CYSTOSCOPY/URETEROSCOPY/HOLMIUM LASER/STENT PLACEMENT;  Surgeon: Robley Fries, MD;  [facility];  Service: Urology;  Laterality: Left;   ESOPHAGOGASTRODUODENOSCOPY     ESOPHAGOGASTRODUODENOSCOPY (EGD) WITH PROPOFOL N/A 07/19/2017   Procedure: ESOPHAGOGASTRODUODENOSCOPY (EGD) WITH PROPOFOL;  Surgeon: Yetta Flock, MD;  [facility];  Service: Gastroenterology;  Laterality: N/A;   INGUINAL HERNIA REPAIR Right    LAPAROSCOPIC CHOLECYSTECTOMY  1989   POLYPECTOMY N/A 07/19/2017   Procedure: POLYPECTOMY;  Surgeon: Yetta Flock, MD;  [facility];  Service: Gastroenterology;  Laterality: N/A;   PROCTOSCOPY N/A 08/06/2017   Procedure: PROCTOSCOPY;  Surgeon: Michael Boston, MD;  [facility];  Service: General;  Laterality: N/A;   TMJ ARTHROPLASTY      TUBAL LIGATION  1988    There were no vitals filed for this visit.   Subjective Assessment - 02/03/21 1403     Subjective The patient reports spinning resolved.  The exercises for gaze bring on foggy headed sensation and fatigue that lasts the rest of the day.    Pertinent History h/o prior vertigo with R BPPV, HLD, HTN, asthma, DM2, OSA and a-fib on coumadin, mild concussion after falling off a ladder 4 years ago    Patient Stated Goals reduce spinning sensation and reduce lightheaded sensation    Currently in Pain? Yes    Effect of Pain on Daily Activities PT to montior, no goals to follow                [facility] PT Assessment - 02/03/21 1414       Assessment   Medical Diagnosis Vertigo    Referring Provider (PT) Genia Harold, MD    Onset Date/Surgical Date 12/26/20                 Vestibular Assessment - 02/03/21 1414       Positional Testing   Dix-Hallpike Dix-Hallpike Right;Dix-Hallpike Left      Dix-Hallpike Right   Dix-Hallpike Right Duration sensation of mild dizziness 1/10    Dix-Hallpike Right Symptoms No nystagmus      Dix-Hallpike Left   Dix-Hallpike Left Duration sensation of mild dizziness    Dix-Hallpike Left Symptoms No nystagmus      Horizontal Canal Right   Horizontal Canal Right Duration none    Horizontal Canal Right Symptoms Normal      Horizontal Canal Left   Horizontal Canal Left Duration none    Horizontal Canal Left Symptoms Normal                      [facility] Adult PT Treatment/Exercise - 02/03/21 1425       Self-Care   Self-Care Other Self-Care Comments    Other Self-Care Comments  discussed progression of HEP for gaze and standing balance emphasizing to avoid any activity that aggravates R ankle pain.      Neuro Re-ed    Neuro Re-ed Details  Corner balance execises performing head motion and narrowing base of support. *Due to recent R ankle injury, did not provide progression to more narrow standing.              Vestibular Treatment/Exercise - 02/03/21 1419       Vestibular Treatment/Exercise   Vestibular Treatment Provided Gaze;Habituation    Habituation Exercises Nestor Lewandowsky    Gaze Exercises X1 Viewing Horizontal      Nestor Lewandowsky  Number of Reps  1    Symptom Description  no symptoms to either side      X1 Viewing Horizontal   Foot Position seated    Comments 10 reps; discussed modifications to HEP to avoid ongoing HEP                         PT Long Term Goals - 01/31/21 2055       PT LONG TERM GOAL #1   Title The patient will be indep with HEP for VOR x 1 viewing, habituation, and balance.    Time 4    Period Weeks    Target Date 03/02/21      PT LONG TERM GOAL #2   Title The patient will have negative positional testing indicating resolution of L BPPV.    Time 4    Period Weeks    Target Date 03/02/21      PT LONG TERM GOAL #3   Title The patient will tolerate gaze x 1 x 60 seconds without c/o dizziness.    Time 4    Period Weeks    Target Date 03/02/21      PT LONG TERM GOAL #4   Title The patient will report 40% improvement in symptoms of dizziness.    Time 4    Period Weeks    Target Date 03/02/21                   Plan - 02/03/21 1513     Clinical Impression Statement The patient's positional vertigo is resolved today, however gaze and motion sensitivity still present.  PT focused on trying habituation, which did not bring on symptoms, and then modifying gaze adaptation.  PT moved gaze from standing>sitting and reduced reps due to reports of fatigue lasting all day after HEP performance.  PT also began balance in standing with consideration to limits from R ankle surgery.  Plan to progress to patient tolerance.    PT Treatment/Interventions ADLs/Self Care Home Management;Canalith Repostioning;Balance training;Vestibular;Gait training;Therapeutic activities;Therapeutic exercise;Patient/family education    PT Next Visit Plan  progress gaze, check balance and dynamic gait (as ankle allows)    Consulted and Agree with Plan of Care Patient             Patient will benefit from skilled therapeutic intervention in order to improve the following deficits and impairments:     Visit Diagnosis: BPPV (benign paroxysmal positional vertigo), left  Dizziness and giddiness     Problem List Patient Active Problem List   Diagnosis Date Noted   Allergic rhinitis 04/17/2020   Chronic sinusitis 04/17/2020   Edema 04/17/2020   Mixed hyperlipidemia 04/17/2020   Morbid obesity ([place]) 04/17/2020   Sciatica 04/17/2020   Skin sensation disturbance 04/17/2020   Thrombophilia ([place]) 04/17/2020   Type 2 diabetes mellitus with other specified complication ([place]) 02/54/2706   Abdominal pain, left lower quadrant 10/27/2018   Loose stools 10/27/2018   Iron deficiency anemia due to chronic blood loss 04/14/2018   Iron malabsorption 04/14/2018   GIB (gastrointestinal bleeding) 04/14/2018   Chronic anticoagulation (warfarin for Afib) 08/06/2017   Gastric polyp    Fall 07/08/2017   Scalp laceration 07/08/2017   Hypokalemia 07/08/2017   Numbness of hand 06/07/2017   Recurrent diverticulitis s/p robotic sigmoid colectomy 08/06/2017 11/24/2016   Right hip pain 05/14/2015   Obesity (BMI 30-39.9) 01/10/2014   Sleep apnea 01/10/2014   Encounter for therapeutic drug monitoring 04/26/2013  Essential hypertension 03/01/2013   History of DVT (deep vein thrombosis) 01/12/2013   Hyperglycemia 01/12/2013   Dizziness 07/09/2010   HYPERCHOLESTEROLEMIA 09/10/2009   Carpal tunnel syndrome 09/10/2009   Asthma 09/10/2009   TMJ SYNDROME 09/10/2009   GERD 09/10/2009   GALLBLADDER DISEASE 09/10/2009   Paroxysmal atrial fibrillation ([place]) 09/09/2009    Cambri Plourde, PT 02/03/2021, 4:55 PM  [facility] [address], [place], [place], [address] Phone: [phone]   Fax:   [phone]  Name: [name] MRN: [id number] Date of Birth: 1955/07/12

## 2021-02-05 ENCOUNTER — Other Ambulatory Visit: Payer: Self-pay | Admitting: Sports Medicine

## 2021-02-05 DIAGNOSIS — M545 Low back pain, unspecified: Secondary | ICD-10-CM

## 2021-02-06 ENCOUNTER — Ambulatory Visit (INDEPENDENT_AMBULATORY_CARE_PROVIDER_SITE_OTHER): Payer: Medicare Other

## 2021-02-06 ENCOUNTER — Other Ambulatory Visit: Payer: Self-pay

## 2021-02-06 DIAGNOSIS — Z5181 Encounter for therapeutic drug level monitoring: Secondary | ICD-10-CM

## 2021-02-06 DIAGNOSIS — I480 Paroxysmal atrial fibrillation: Secondary | ICD-10-CM | POA: Diagnosis not present

## 2021-02-06 DIAGNOSIS — I4891 Unspecified atrial fibrillation: Secondary | ICD-10-CM | POA: Diagnosis not present

## 2021-02-06 LAB — POCT INR: INR: 2.2 (ref 2.0–3.0)

## 2021-02-06 NOTE — Patient Instructions (Signed)
Description   Continue taking Warfarin 4mg  daily except 6mg  on Tuesdays, Thursdays, and Saturdays. Recheck INR in 4 weeks. Call Coumadin Clinic [phone] Main # [phone] with any questions

## 2021-02-10 ENCOUNTER — Ambulatory Visit (HOSPITAL_COMMUNITY): Payer: Medicare Other | Attending: Cardiovascular Disease

## 2021-02-10 ENCOUNTER — Encounter: Payer: Self-pay | Admitting: Cardiology

## 2021-02-10 NOTE — Progress Notes (Unsigned)
Patient ID: [name], female   DOB: [birth year], 65 y.o.   MRN: [id number]  Verified appointment "no show" status with Renetta at 4:11pm.

## 2021-02-11 ENCOUNTER — Encounter (HOSPITAL_COMMUNITY): Payer: Self-pay | Admitting: Internal Medicine

## 2021-02-17 ENCOUNTER — Ambulatory Visit: Payer: Medicare Other | Admitting: Rehabilitative and Restorative Service Providers"

## 2021-02-21 ENCOUNTER — Other Ambulatory Visit: Payer: Self-pay

## 2021-02-21 ENCOUNTER — Ambulatory Visit: Payer: Medicare Other | Attending: Psychiatry | Admitting: Rehabilitative and Restorative Service Providers"

## 2021-02-21 DIAGNOSIS — R42 Dizziness and giddiness: Secondary | ICD-10-CM | POA: Insufficient documentation

## 2021-02-21 DIAGNOSIS — H8112 Benign paroxysmal vertigo, left ear: Secondary | ICD-10-CM | POA: Insufficient documentation

## 2021-02-21 NOTE — Patient Instructions (Signed)
Access Code: [email or web address] URL: [email or web address] Date: 02/21/2021 Prepared by: Rudell Cobb  Exercises Seated Gaze Stabilization with Head Rotation - 3 x daily - 7 x weekly - 1 sets - 2 reps - 45-60 seconds hold Standing Gaze Stabilization with Head Rotation - 2 x daily - 7 x weekly - 1 sets - 1 reps - 30 seconds hold

## 2021-02-21 NOTE — Therapy (Addendum)
[facility] [address], [place], [place], [address] Phone: [phone]   Fax:  [phone]  Physical Therapy Treatment/ Discharge Summary  Patient Details  Name: [name] MRN: [id number] Date of Birth: [birth year] Referring Provider (PT): Genia Harold, MD   Encounter Date: 02/21/2021   PT End of Session - 02/21/21 0915     Visit Number 3    Number of Visits 4    Date for PT Re-Evaluation 03/02/21    Authorization Type medicare    Progress Note Due on Visit 10    PT Start Time 0849    PT Stop Time 0914    PT Time Calculation (min) 25 min             Past Medical History:  Diagnosis Date   Allergic rhinitis    Arthritis    "back" (05/07/2016)   Asthma    Atrial flutter ([place])    a. Remotely per notes.   Carpal tunnel syndrome, bilateral    Colon polyps    Diverticulitis    Dizziness    DVT (deep venous thrombosis) ([place]) 1980s x2 -    between birth of two sons. Saw hematology - was told she has a hypercoagulable disorder, should be on Coumadin lifelong.   Dysrhythmia    atrial fib   Family history of adverse reaction to anesthesia    "mother gets PONV"   Gallbladder disease    GERD (gastroesophageal reflux disease)    GIB (gastrointestinal bleeding) 04/14/2018   Halothane adverse reaction    narrow small opening   Heart murmur    Hiatal hernia    History of hiatal hernia    "small one/CTA 05/01/2016" (05/07/2016)   HTN (hypertension)    Hypercholesteremia    hx; "brought it down w/diet" (05/07/2016)   Hyperglycemia    a. A1c 6.1 in 2014.   Inguinal hernia    Iron deficiency anemia due to chronic blood loss 04/14/2018   Iron malabsorption 04/14/2018   Left sided sciatica    Normal cardiac stress test 06/2012   Obesity    OSA (obstructive sleep apnea)    a. Mild, did not tolerate CPAP (05/07/2016)   PAF (paroxysmal atrial fibrillation) ([place])    a. s/p afib ablation at [facility] 2010 (Dr.  Annabell Howells). b. Recurrent AF s/p DCCV 06/2010. c. On flecainide.    Paroxysmal atrial fibrillation ([employer]) 09/09/2009   Qualifier: Diagnosis of  By: Selena Batten CMA, Jewel     PONV (postoperative nausea and vomiting)    PPD positive, treated 1977   "treated for 1 yr after exposure to patient"   Pre-diabetes    RSV infection ~ 2006   Vertigo    Wandering (atrial) pacemaker    a. Remotely per notes.  (  pt. states has a wandering p wave ) no pacemaker    Past Surgical History:  Procedure Laterality Date   ANKLE ARTHODESIS W/ ARTHROSCOPY Right 11/12/2020   ATRIAL ABLATION SURGERY  05/07/2016   "fib and flutter"   ATRIAL FIBRILLATION ABLATION  2010   at [place] [address] N/A 05/07/2016   Procedure: Atrial Fibrillation Ablation;  Surgeon: Thompson Grayer, MD;  Location: [facility];  Service: Cardiovascular;  Laterality: N/A;   BREAST CYST ASPIRATION Bilateral    CARDIAC CATHETERIZATION  2008   CARPAL TUNNEL RELEASE Bilateral    CESAREAN SECTION  1986; 1988   COLONOSCOPY  X 2  COLONOSCOPY W/ BIOPSIES AND POLYPECTOMY  X 2   CYSTOSCOPY/URETEROSCOPY/HOLMIUM LASER/STENT PLACEMENT Left 12/23/2019   Procedure: CYSTOSCOPY/URETEROSCOPY/HOLMIUM LASER/STENT PLACEMENT;  Surgeon: Robley Fries, MD;  [facility];  Service: Urology;  Laterality: Left;   ESOPHAGOGASTRODUODENOSCOPY     ESOPHAGOGASTRODUODENOSCOPY (EGD) WITH PROPOFOL N/A 07/19/2017   Procedure: ESOPHAGOGASTRODUODENOSCOPY (EGD) WITH PROPOFOL;  Surgeon: Yetta Flock, MD;  [facility];  Service: Gastroenterology;  Laterality: N/A;   INGUINAL HERNIA REPAIR Right    LAPAROSCOPIC CHOLECYSTECTOMY  1989   POLYPECTOMY N/A 07/19/2017   Procedure: POLYPECTOMY;  Surgeon: Yetta Flock, MD;  [facility];  Service: Gastroenterology;  Laterality: N/A;   PROCTOSCOPY N/A 08/06/2017   Procedure: PROCTOSCOPY;  Surgeon: Michael Boston, MD;  [facility];  Service: General;  Laterality:  N/A;   TMJ ARTHROPLASTY     TUBAL LIGATION  1988    There were no vitals filed for this visit.   Subjective Assessment - 02/21/21 0851     Subjective The patient reports spinning continues to be resolved.  She is tolerating the modified HEP better.  She reports she has progressed in ankle rehab to balance activities in standing and she has no limitations at this point.    Pertinent History h/o prior vertigo with R BPPV, HLD, HTN, asthma, DM2, OSA and a-fib on coumadin, mild concussion after falling off a ladder 4 years ago    Patient Stated Goals reduce spinning sensation and reduce lightheaded sensation    Currently in Pain? Yes    Pain Relieving Factors Monitoring response to care-- she is receiving PT for ankle at another location                [facility] PT Assessment - 02/21/21 0918       Assessment   Medical Diagnosis Vertigo    Referring Provider (PT) Genia Harold, MD    Onset Date/Surgical Date 12/26/20                           [facility] Adult PT Treatment/Exercise - 02/21/21 0001       Self-Care   Self-Care Other Self-Care Comments    Other Self-Care Comments  discussed self progression of HEP, discussed balance activities that would help incorporate vestibular activities into ortho PT (adding head motion when on foam, dynamic gait, and turns).  Patient feels she is able to self progress with HEP.             Vestibular Treatment/Exercise - 02/21/21 4825       Vestibular Treatment/Exercise   Vestibular Treatment Provided Gaze      X1 Viewing Horizontal   Foot Position seated    Comments worked up to 1 minute today with progression o fHEP 45-60 seconds.  Also discussed how to modify if this brings on lasting symptoms.                    PT Education - 02/21/21 0914     Education Details HEP    Person(s) Educated Patient    Methods Explanation;Demonstration;Handout    Comprehension Verbalized understanding;Returned demonstration                  PT Long Term Goals - 02/21/21 0853       PT LONG TERM GOAL #1   Title The patient will be indep with HEP for VOR x 1 viewing, habituation, and balance.    Time 4  Period Weeks    Status Achieved      PT LONG TERM GOAL #2   Title The patient will have negative positional testing indicating resolution of L BPPV.    Time 4    Period Weeks    Status Achieved      PT LONG TERM GOAL #3   Title The patient will tolerate gaze x 1 x 60 seconds without c/o dizziness.    Time 4    Period Weeks    Status Achieved      PT LONG TERM GOAL #4   Title The patient will report 40% improvement in symptoms of dizziness.    Baseline 90% improvement since evaluation.    Time 4    Period Weeks    Status Achieved                   Plan - 02/21/21 0915     Clinical Impression Statement The patient has met all LTGs today.  She continues with  observable difficulty maintaining gaze on target with VOR x 1 in sitting position.  PT spent today's session educating her on progression of HEP for gaze.  Balance activities are being performed in ankle rehab--I recommended she discuss adding head motion and turns to dynamic balance tasks if her ortho therapist is agreeable.  Plan to  put on hold x 3-4 weeks with instructions for patient to call us if any need for a visit.    PT Treatment/Interventions ADLs/Self Care Home Management;Canalith Repostioning;Balance training;Vestibular;Gait training;Therapeutic activities;Therapeutic exercise;Patient/family education    PT Next Visit Plan If needs to return, progress gaze, if no return, plan to d/c in 4 weeks.    Consulted and Agree with Plan of Care Patient             Patient will benefit from skilled therapeutic intervention in order to improve the following deficits and impairments:     Visit Diagnosis: BPPV (benign paroxysmal positional vertigo), left  Dizziness and giddiness     Problem List Patient Active  Problem List   Diagnosis Date Noted   Allergic rhinitis 04/17/2020   Chronic sinusitis 04/17/2020   Edema 04/17/2020   Mixed hyperlipidemia 04/17/2020   Morbid obesity ([place]) 04/17/2020   Sciatica 04/17/2020   Skin sensation disturbance 04/17/2020   Thrombophilia ([place]) 04/17/2020   Type 2 diabetes mellitus with other specified complication ([place]) 21/01/5519   Abdominal pain, left lower quadrant 10/27/2018   Loose stools 10/27/2018   Iron deficiency anemia due to chronic blood loss 04/14/2018   Iron malabsorption 04/14/2018   GIB (gastrointestinal bleeding) 04/14/2018   Chronic anticoagulation (warfarin for Afib) 08/06/2017   Gastric polyp    Fall 07/08/2017   Scalp laceration 07/08/2017   Hypokalemia 07/08/2017   Numbness of hand 06/07/2017   Recurrent diverticulitis s/p robotic sigmoid colectomy 08/06/2017 11/24/2016   Right hip pain 05/14/2015   Obesity (BMI 30-39.9) 01/10/2014   Sleep apnea 01/10/2014   Encounter for therapeutic drug monitoring 04/26/2013   Essential hypertension 03/01/2013   History of DVT (deep vein thrombosis) 01/12/2013   Hyperglycemia 01/12/2013   Dizziness 07/09/2010   HYPERCHOLESTEROLEMIA 09/10/2009   Carpal tunnel syndrome 09/10/2009   Asthma 09/10/2009   TMJ SYNDROME 09/10/2009   GERD 09/10/2009   GALLBLADDER DISEASE 09/10/2009   Paroxysmal atrial fibrillation ([place]) 09/09/2009    PHYSICAL THERAPY DISCHARGE SUMMARY  Visits from Start of Care: 3  Current functional level related to goals / functional outcomes: See goals above-- all met  Remaining deficits: Patient had resolution of symptoms.  PT put on hold to ensure symptoms remained resolved and she was progressing balance activities with ankle rehab.   Education / Equipment: HEP   Patient agrees to discharge. Patient goals were met. Patient is being discharged due to meeting the stated rehab goals.   [place], PT 02/21/2021, 9:20 AM  [facility] [address], [place], [place], [address] Phone: [phone]   Fax:  [phone]  Name: LATORSHA CURLING MRN: [id number] Date of Birth: May 26, 1955

## 2021-02-23 ENCOUNTER — Ambulatory Visit
Admission: RE | Admit: 2021-02-23 | Discharge: 2021-02-23 | Disposition: A | Discharge status: Home / Self Care | Payer: Medicare Other | Source: Ambulatory Visit | Attending: Sports Medicine | Admitting: Sports Medicine

## 2021-02-23 DIAGNOSIS — M545 Low back pain, unspecified: Secondary | ICD-10-CM

## 2021-02-25 ENCOUNTER — Telehealth: Payer: Self-pay

## 2021-02-25 NOTE — Telephone Encounter (Signed)
Great - thank you for the update.

## 2021-02-25 NOTE — Telephone Encounter (Signed)
Called and spoke to patient. She has seen Cardiology but they want her to have an ECHO. She is scheduled to have one on 12-13. She indicated she will call us once she has the results of that to get scheduled for her colonoscopy with Dr. Havery Moros

## 2021-02-25 NOTE — Telephone Encounter (Signed)
-----   Message from Roetta Sessions, [place] sent at 01/23/2021 10:47 AM EDT ----- Regarding: chart check Check to see if patient has been cleared by Cardiology to have colonoscopy.  Will need to hold warfarin for 5 days

## 2021-03-04 ENCOUNTER — Other Ambulatory Visit (HOSPITAL_COMMUNITY): Payer: Medicare Other

## 2021-03-10 ENCOUNTER — Ambulatory Visit (INDEPENDENT_AMBULATORY_CARE_PROVIDER_SITE_OTHER): Payer: Medicare Other

## 2021-03-10 ENCOUNTER — Other Ambulatory Visit: Payer: Self-pay

## 2021-03-10 DIAGNOSIS — Z5181 Encounter for therapeutic drug level monitoring: Secondary | ICD-10-CM | POA: Diagnosis not present

## 2021-03-10 DIAGNOSIS — I4891 Unspecified atrial fibrillation: Secondary | ICD-10-CM

## 2021-03-10 DIAGNOSIS — I480 Paroxysmal atrial fibrillation: Secondary | ICD-10-CM

## 2021-03-10 LAB — POCT INR: INR: 2.1 (ref 2.0–3.0)

## 2021-03-10 NOTE — Patient Instructions (Signed)
Description   Continue taking Warfarin 4mg  daily except 6mg  on Tuesdays, Thursdays, and Saturdays. Recheck INR in 6 weeks. Call Coumadin Clinic [phone] Main # [phone] with any questions

## 2021-03-14 ENCOUNTER — Ambulatory Visit (HOSPITAL_COMMUNITY): Payer: Medicare Other | Attending: Cardiology

## 2021-03-14 ENCOUNTER — Other Ambulatory Visit: Payer: Self-pay

## 2021-03-14 DIAGNOSIS — G4733 Obstructive sleep apnea (adult) (pediatric): Secondary | ICD-10-CM | POA: Diagnosis present

## 2021-03-14 DIAGNOSIS — I10 Essential (primary) hypertension: Secondary | ICD-10-CM | POA: Diagnosis present

## 2021-03-14 DIAGNOSIS — I480 Paroxysmal atrial fibrillation: Secondary | ICD-10-CM | POA: Diagnosis present

## 2021-03-15 LAB — ECHOCARDIOGRAM COMPLETE
AR max vel: 2.6 cm2
AV Area VTI: 2.49 cm2
AV Area mean vel: 2.46 cm2
AV Mean grad: 6 mmHg
AV Peak grad: 10.8 mmHg
Ao pk vel: 1.65 m/s
Area-P 1/2: 3.32 cm2
S' Lateral: 2.5 cm

## 2021-03-18 ENCOUNTER — Ambulatory Visit: Payer: Medicare Other | Admitting: Neurology

## 2021-03-28 ENCOUNTER — Other Ambulatory Visit: Payer: Self-pay

## 2021-03-28 ENCOUNTER — Encounter: Payer: Self-pay | Admitting: Podiatry

## 2021-03-28 ENCOUNTER — Ambulatory Visit (INDEPENDENT_AMBULATORY_CARE_PROVIDER_SITE_OTHER): Payer: Medicare Other | Admitting: Podiatry

## 2021-03-28 DIAGNOSIS — M79674 Pain in right toe(s): Secondary | ICD-10-CM

## 2021-03-28 DIAGNOSIS — B351 Tinea unguium: Secondary | ICD-10-CM | POA: Diagnosis not present

## 2021-03-28 DIAGNOSIS — M79675 Pain in left toe(s): Secondary | ICD-10-CM

## 2021-03-28 DIAGNOSIS — D689 Coagulation defect, unspecified: Secondary | ICD-10-CM | POA: Diagnosis not present

## 2021-03-28 DIAGNOSIS — Z7901 Long term (current) use of anticoagulants: Secondary | ICD-10-CM

## 2021-03-28 NOTE — Progress Notes (Signed)
This patient returns to my office for at risk foot care.  This patient requires this care by a professional since this patient will be at risk due to having coagulation defect.  Patient is taking coumadin.  This patient is unable to cut nails herself since the patient cannot reach her nails.These nails are painful walking and wearing shoes.  This patient presents for at risk foot care today.  General Appearance  Alert, conversant and in no acute stress.  Vascular  Dorsalis pedis and posterior tibial  pulses are palpable  bilaterally.  Capillary return is within normal limits  bilaterally. Temperature is within normal limits  bilaterally.  Neurologic  Senn-Weinstein monofilament wire test within normal limits  bilaterally. Muscle power within normal limits bilaterally.  Nails Thick disfigured discolored nails with subungual debris  Second left and first and third right foot.  No evidence of bacterial infection or drainage bilaterally.  Orthopedic  No limitations of motion  feet .  No crepitus or effusions noted.  No bony pathology or digital deformities noted.  Skin  normotropic skin with no porokeratosis noted bilaterally.  No signs of infections or ulcers noted.     Onychomycosis  Pain in right toes  Pain in left toes  Consent was obtained for treatment procedures.   Mechanical debridement of nails 1-5  bilaterally performed with a nail nipper.  Filed with dremel without incident.    Return office visit   9 weeks                  Told patient to return for periodic foot care and evaluation due to potential at risk complications.   Trayson Stitely DPM  

## 2021-03-29 ENCOUNTER — Encounter (HOSPITAL_BASED_OUTPATIENT_CLINIC_OR_DEPARTMENT_OTHER): Payer: Self-pay | Admitting: Emergency Medicine

## 2021-03-29 ENCOUNTER — Other Ambulatory Visit: Payer: Self-pay

## 2021-03-29 ENCOUNTER — Observation Stay (HOSPITAL_BASED_OUTPATIENT_CLINIC_OR_DEPARTMENT_OTHER)
Admission: EM | Admit: 2021-03-29 | Discharge: 2021-03-30 | Disposition: A | Discharge status: Home / Self Care | Payer: Medicare Other | Attending: Internal Medicine | Admitting: Internal Medicine

## 2021-03-29 ENCOUNTER — Emergency Department (HOSPITAL_BASED_OUTPATIENT_CLINIC_OR_DEPARTMENT_OTHER): Payer: Medicare Other

## 2021-03-29 DIAGNOSIS — J45909 Unspecified asthma, uncomplicated: Secondary | ICD-10-CM | POA: Diagnosis present

## 2021-03-29 DIAGNOSIS — Z86718 Personal history of other venous thrombosis and embolism: Secondary | ICD-10-CM | POA: Diagnosis not present

## 2021-03-29 DIAGNOSIS — Z79899 Other long term (current) drug therapy: Secondary | ICD-10-CM | POA: Diagnosis not present

## 2021-03-29 DIAGNOSIS — R739 Hyperglycemia, unspecified: Secondary | ICD-10-CM | POA: Insufficient documentation

## 2021-03-29 DIAGNOSIS — I10 Essential (primary) hypertension: Secondary | ICD-10-CM | POA: Insufficient documentation

## 2021-03-29 DIAGNOSIS — Z91040 Latex allergy status: Secondary | ICD-10-CM | POA: Insufficient documentation

## 2021-03-29 DIAGNOSIS — Z20822 Contact with and (suspected) exposure to covid-19: Secondary | ICD-10-CM | POA: Diagnosis not present

## 2021-03-29 DIAGNOSIS — Z91010 Allergy to peanuts: Secondary | ICD-10-CM | POA: Insufficient documentation

## 2021-03-29 DIAGNOSIS — I480 Paroxysmal atrial fibrillation: Secondary | ICD-10-CM | POA: Diagnosis present

## 2021-03-29 DIAGNOSIS — R42 Dizziness and giddiness: Secondary | ICD-10-CM

## 2021-03-29 DIAGNOSIS — I209 Angina pectoris, unspecified: Secondary | ICD-10-CM | POA: Diagnosis present

## 2021-03-29 DIAGNOSIS — Z7901 Long term (current) use of anticoagulants: Secondary | ICD-10-CM | POA: Insufficient documentation

## 2021-03-29 DIAGNOSIS — R0602 Shortness of breath: Secondary | ICD-10-CM | POA: Insufficient documentation

## 2021-03-29 DIAGNOSIS — R079 Chest pain, unspecified: Secondary | ICD-10-CM | POA: Diagnosis present

## 2021-03-29 LAB — TROPONIN I (HIGH SENSITIVITY)
Troponin I (High Sensitivity): 3 ng/L (ref <18)
Troponin I (High Sensitivity): 3 ng/L (ref <18)

## 2021-03-29 LAB — BASIC METABOLIC PANEL
Anion gap: 10 (ref 5–15)
BUN: 17 mg/dL (ref 8–23)
CO2: 26 mmol/L (ref 22–32)
Calcium: 9.4 mg/dL (ref 8.9–10.3)
Chloride: 99 mmol/L (ref 98–111)
Creatinine, Ser: 0.65 mg/dL (ref 0.44–1.00)
GFR, Estimated: >60 mL/min (ref >60)
Glucose, Bld: 172 mg/dL — ABNORMAL HIGH (ref 70–99)
Potassium: 4.1 mmol/L (ref 3.5–5.1)
Sodium: 135 mmol/L (ref 135–145)

## 2021-03-29 LAB — CBC
HCT: 43.9 % (ref 36.0–46.0)
Hemoglobin: 15 g/dL (ref 12.0–15.0)
MCH: 30.9 pg (ref 26.0–34.0)
MCHC: 34.2 g/dL (ref 30.0–36.0)
MCV: 90.5 fL (ref 80.0–100.0)
Platelets: 341 10*3/uL (ref 150–400)
RBC: 4.85 MIL/uL (ref 3.87–5.11)
RDW: 12.8 % (ref 11.5–15.5)
WBC: 10.8 10*3/uL — ABNORMAL HIGH (ref 4.0–10.5)
nRBC: 0 % (ref 0.0–0.2)

## 2021-03-29 LAB — RESP PANEL BY RT-PCR (FLU A&B, COVID) ARPGX2
Influenza A by PCR: NEGATIVE
Influenza B by PCR: NEGATIVE
SARS Coronavirus 2 by RT PCR: NEGATIVE

## 2021-03-29 LAB — HEPATIC FUNCTION PANEL
ALT: 31 U/L (ref 0–44)
AST: 25 U/L (ref 15–41)
Albumin: 3.6 g/dL (ref 3.5–5.0)
Alkaline Phosphatase: 67 U/L (ref 38–126)
Bilirubin, Direct: <0.1 mg/dL (ref 0.0–0.2)
Total Bilirubin: 0.3 mg/dL (ref 0.3–1.2)
Total Protein: 7.3 g/dL (ref 6.5–8.1)

## 2021-03-29 LAB — D-DIMER, QUANTITATIVE: D-Dimer, Quant: 0.4 ug/mL-FEU (ref 0.00–0.50)

## 2021-03-29 LAB — LIPASE, BLOOD: Lipase: 31 U/L (ref 11–51)

## 2021-03-29 LAB — PROTIME-INR
INR: 2.4 — ABNORMAL HIGH (ref 0.8–1.2)
Prothrombin Time: 25.9 seconds — ABNORMAL HIGH (ref 11.4–15.2)

## 2021-03-29 LAB — BRAIN NATRIURETIC PEPTIDE: B Natriuretic Peptide: 26.1 pg/mL (ref 0.0–100.0)

## 2021-03-29 MED ORDER — VERAPAMIL HCL 80 MG PO TABS
80.0000 mg | ORAL_TABLET | Freq: Two times a day (BID) | ORAL | Status: DC
  Administered 2021-03-30 (×2): 80 mg via ORAL
  Filled 2021-03-29 (×4): qty 1

## 2021-03-29 MED ORDER — ALUM & MAG HYDROXIDE-SIMETH 200-200-20 MG/5ML PO SUSP
30.0000 mL | Freq: Once | ORAL | Status: AC
Start: 2021-03-29 — End: 2021-03-29
  Administered 2021-03-29: 30 mL via ORAL
  Filled 2021-03-29: qty 30

## 2021-03-29 MED ORDER — ALBUTEROL SULFATE (2.5 MG/3ML) 0.083% IN NEBU: 2.5000 mg | INHALATION_SOLUTION | Freq: Four times a day (QID) | RESPIRATORY_TRACT | Status: DC | PRN

## 2021-03-29 MED ORDER — SODIUM CHLORIDE 0.9 % IV BOLUS
1000.0000 mL | Freq: Once | INTRAVENOUS | Status: AC
  Administered 2021-03-29: 1000 mL via INTRAVENOUS

## 2021-03-29 MED ORDER — VENLAFAXINE HCL ER 37.5 MG PO CP24
37.5000 mg | ORAL_CAPSULE | Freq: Every day | ORAL | Status: DC
  Administered 2021-03-30: 37.5 mg via ORAL
  Filled 2021-03-29: qty 1

## 2021-03-29 MED ORDER — BUDESONIDE 0.25 MG/2ML IN SUSP
0.2500 mg | Freq: Two times a day (BID) | RESPIRATORY_TRACT | Status: DC
  Administered 2021-03-30: 0.25 mg via RESPIRATORY_TRACT
  Filled 2021-03-29 (×2): qty 2

## 2021-03-29 MED ORDER — NITROGLYCERIN 0.4 MG SL SUBL
0.4000 mg | SUBLINGUAL_TABLET | SUBLINGUAL | Status: AC | PRN
  Administered 2021-03-29: 0.4 mg via SUBLINGUAL
  Filled 2021-03-29: qty 1

## 2021-03-29 MED ORDER — WARFARIN SODIUM 3 MG PO TABS
6.0000 mg | ORAL_TABLET | Freq: Once | ORAL | Status: DC
  Filled 2021-03-29: qty 2

## 2021-03-29 MED ORDER — WARFARIN - PHARMACIST DOSING INPATIENT: Freq: Every day | Status: DC

## 2021-03-29 MED ORDER — ALBUTEROL SULFATE HFA 108 (90 BASE) MCG/ACT IN AERS: 2.0000 | INHALATION_SPRAY | Freq: Four times a day (QID) | RESPIRATORY_TRACT | Status: DC | PRN

## 2021-03-29 MED ORDER — PANTOPRAZOLE SODIUM 40 MG PO TBEC
40.0000 mg | DELAYED_RELEASE_TABLET | Freq: Every day | ORAL | Status: DC
  Administered 2021-03-30: 40 mg via ORAL
  Filled 2021-03-29: qty 1

## 2021-03-29 MED ORDER — EZETIMIBE 10 MG PO TABS
10.0000 mg | ORAL_TABLET | Freq: Every day | ORAL | Status: DC
Start: 2021-03-30 — End: 2021-03-30
  Administered 2021-03-30: 10 mg via ORAL
  Filled 2021-03-29: qty 1

## 2021-03-29 MED ORDER — NITROGLYCERIN 0.4 MG SL SUBL: 0.4000 mg | SUBLINGUAL_TABLET | SUBLINGUAL | Status: DC | PRN

## 2021-03-29 MED ORDER — LIDOCAINE VISCOUS HCL 2 % MT SOLN
15.0000 mL | Freq: Once | OROMUCOSAL | Status: AC
Start: 2021-03-29 — End: 2021-03-29
  Administered 2021-03-29: 15 mL via ORAL
  Filled 2021-03-29: qty 15

## 2021-03-29 MED ORDER — LOSARTAN POTASSIUM 25 MG PO TABS
25.0000 mg | ORAL_TABLET | Freq: Every day | ORAL | Status: DC
  Administered 2021-03-30: 25 mg via ORAL
  Filled 2021-03-29: qty 1

## 2021-03-29 NOTE — ED Notes (Signed)
[facility] at bedside 

## 2021-03-29 NOTE — ED Provider Notes (Signed)
[facility] Provider Note   CSN: [id number] Arrival date & time: 03/29/21  1427     History  Chief Complaint  Patient presents with   Chest Pain    [name] is a 66 y.o. female.  History includes atrial fibrillation, obesity, hypertension, hyperlipidemia, prediabetes, OSA, asthma, and GERD.  Patient presents the emergency department with chest pain started yesterday afternoon is in the center of her chest and radiates to her back and shoulders.  Chest pain is worse with exertion.  She says every time she gets up and walks around her heart rate gets very high.  She has had worsening shortness of breath over the past week or so with a cough.  She says she has had yellowish sputum in her cough.  She also endorses some dizziness for the past 6 days is worse when she is walking around.  She denies any leg swelling, hemoptysis. Patient states that she had a cardiac cath in 2006 which they did not find any blockages and no stent was placed at that time.  She also states she had a normal echo last month.   Chest Pain Associated symptoms: back pain, dizziness and shortness of breath    HPI: A 66 year old patient with a history of hypertension, hypercholesterolemia and obesity presents for evaluation of chest pain. Initial onset of pain was less than one hour ago. The patient's chest pain is described as heaviness/pressure/tightness and is worse with exertion. The patient complains of nausea. The patient's chest pain is middle- or left-sided, is not well-localized, is not sharp and does not radiate to the arms/jaw/neck. The patient denies diaphoresis. The patient has no history of stroke, has no history of peripheral artery disease, has not smoked in the past 90 days, denies any history of treated diabetes and has no relevant family history of coronary artery disease (first degree relative at less than age 93).   Home Medications Prior to Admission medications    Medication Sig Start Date End Date Taking? Authorizing Provider  acetaminophen (TYLENOL) 650 MG CR tablet Take 1,300 mg by mouth every 8 (eight) hours as needed for pain.     [provider]  cetirizine (ZYRTEC) 10 MG tablet Take 10 mg by mouth daily.    [provider]  Cholecalciferol (VITAMIN D3) 1000 UNITS CAPS Take 2,000 Units by mouth daily.    [provider]  dicyclomine (BENTYL) 10 MG capsule TAKE 1 CAPSULE BY MOUTH 30 MINS BEFORE MEALS AND AT BEDTIME AS NEEDED FOR PAIN/CRAMPING 12/23/18   Zehr, Laban Emperor, PA-C  ezetimibe (ZETIA) 10 MG tablet Take 1 tablet (10 mg total) by mouth daily. 04/30/20 07/27/22  HiltyNadean Corwin, MD  FLOVENT HFA 220 MCG/ACT inhaler Inhale 1 puff into the lungs 2 (two) times daily. 04/20/16   [provider]  fluticasone (FLONASE) 50 MCG/ACT nasal spray Place 1 spray into both nostrils 2 (two) times daily.  06/02/12   [provider]  linaclotide Rolan Lipa) 145 MCG CAPS capsule Take 1 capsule (145 mcg total) by mouth daily before breakfast. Exp: 08-2021, LOT: [id number] 01/23/21   Armbruster, Carlota Raspberry, MD  losartan (COZAAR) 25 MG tablet Take 1 tablet (25 mg total) by mouth daily. 12/20/13   Allred, Jeneen Rinks, MD  meclizine (ANTIVERT) 25 MG tablet Take 1 tablet (25 mg total) by mouth 3 (three) times daily as needed for dizziness. 01/19/18   Dorie Rank, MD  Multiple Vitamin (MULTIVITAMIN WITH MINERALS) TABS tablet Take 1 tablet  by mouth daily.    [provider]  omeprazole (PRILOSEC) 40 MG capsule Take 1 capsule (40 mg total) by mouth 2 (two) times daily. Must have an office visit for any further refills. Thank you 05/03/19   Armbruster, Carlota Raspberry, MD  polyethylene glycol (MIRALAX) 17 g packet Take 17 g by mouth 2 (two) times daily. Titrate as needed 01/23/21   Armbruster, Carlota Raspberry, MD  PROAIR HFA 108 (90 BASE) MCG/ACT inhaler Inhale 2 puffs into the lungs every 6 (six) hours as needed for wheezing or shortness of breath.  03/22/12    [provider]  venlafaxine XR (EFFEXOR-XR) 37.5 MG 24 hr capsule Take 37.5 mg by mouth daily with breakfast.  11/21/12   [provider]  verapamil (CALAN) 80 MG tablet Take 1 tablet (80 mg total) by mouth every morning AND 2 tablets (160 mg total) every evening. 01/27/21   Allred, Jeneen Rinks, MD  warfarin (COUMADIN) 2 MG tablet TAKE AS DIRECTED BY COUMADIN CLINIC 12/12/20   Allred, Jeneen Rinks, MD      Allergies    Aspirin, Banana, Food, Ivp dye [iodinated contrast media], Latex, Meperidine hcl, Peanut-containing drug products, Penicillins, Statins, Sulfonamide derivatives, Tape, Alfuzosin, Other, and Sulfamethoxazole    Review of Systems   Review of Systems  Respiratory:  Positive for shortness of breath.   Cardiovascular:  Positive for chest pain. Negative for leg swelling.  Musculoskeletal:  Positive for back pain.  Neurological:  Positive for dizziness.  All other systems reviewed and are negative.  Physical Exam Updated Vital Signs BP (!) 146/80    Pulse 80    Temp 98.7 F (37.1 C) (Oral)    Resp 18    Ht 5\' 4"  (1.626 m)    Wt 99.8 kg    SpO2 100%    BMI 37.76 kg/m  Physical Exam Vitals and nursing note reviewed.  Constitutional:      General: She is not in acute distress.    Appearance: Normal appearance. She is obese. She is not ill-appearing, toxic-appearing or diaphoretic.  HENT:     Head: Normocephalic and atraumatic.     Nose: No nasal deformity.     Mouth/Throat:     Lips: Pink. No lesions.     Mouth: Mucous membranes are moist. No injury, lacerations, oral lesions or angioedema.     Pharynx: Oropharynx is clear. Uvula midline. No pharyngeal swelling, oropharyngeal exudate, posterior oropharyngeal erythema or uvula swelling.  Eyes:     General: Gaze aligned appropriately. No scleral icterus.       Right eye: No discharge.        Left eye: No discharge.     Conjunctiva/sclera: Conjunctivae normal.     Right eye: Right conjunctiva is not injected. No exudate or  hemorrhage.    Left eye: Left conjunctiva is not injected. No exudate or hemorrhage. Cardiovascular:     Rate and Rhythm: Normal rate and regular rhythm.     Pulses: Normal pulses.          Radial pulses are 2+ on the right side and 2+ on the left side.       Dorsalis pedis pulses are 2+ on the right side and 2+ on the left side.     Heart sounds: Normal heart sounds, S1 normal and S2 normal. Heart sounds not distant. No murmur heard.   No friction rub. No gallop. No S3 or S4 sounds.  Pulmonary:     Effort: Pulmonary effort is normal. No  tachypnea, accessory muscle usage or respiratory distress.     Breath sounds: Normal breath sounds. No stridor. No wheezing, rhonchi or rales.  Chest:     Chest wall: No tenderness.  Abdominal:     General: Abdomen is flat. Bowel sounds are normal. There is no distension.     Palpations: Abdomen is soft. There is no mass or pulsatile mass.     Tenderness: There is no abdominal tenderness. There is no guarding or rebound.  Musculoskeletal:     Right lower leg: No edema.     Left lower leg: No edema.  Skin:    General: Skin is warm and dry.     Coloration: Skin is not jaundiced or pale.     Findings: No bruising, erythema, lesion or rash.  Neurological:     General: No focal deficit present.     Mental Status: She is alert and oriented to person, place, and time.     GCS: GCS eye subscore is 4. GCS verbal subscore is 5. GCS motor subscore is 6.  Psychiatric:        Mood and Affect: Mood normal.        Behavior: Behavior normal. Behavior is cooperative.    ED Results / Procedures / Treatments   Labs (all labs ordered are listed, but only abnormal results are displayed) Labs Reviewed  BASIC METABOLIC PANEL - Abnormal; Notable for the following components:      Result Value   Glucose, Bld 172 (*)    All other components within normal limits  CBC - Abnormal; Notable for the following components:   WBC 10.8 (*)    All other components within  normal limits  PROTIME-INR - Abnormal; Notable for the following components:   Prothrombin Time 25.9 (*)    INR 2.4 (*)    All other components within normal limits  RESP PANEL BY RT-PCR (FLU A&B, COVID) ARPGX2  BRAIN NATRIURETIC PEPTIDE  D-DIMER, QUANTITATIVE  HEPATIC FUNCTION PANEL  LIPASE, BLOOD  TROPONIN I (HIGH SENSITIVITY)  TROPONIN I (HIGH SENSITIVITY)    EKG EKG Interpretation  Date/Time:  Saturday March 29 2021 14:34:21 EST Ventricular Rate:  88 PR Interval:  126 QRS Duration: 72 QT Interval:  348 QTC Calculation: 421 R Axis:   -2 Text Interpretation: Normal sinus rhythm When compared with ECG of 03-Jun-2020 13:32, PREVIOUS ECG IS PRESENT Confirmed by Lennice Sites ([id number]) on 03/29/2021 2:55:23 PM  Radiology DG Chest 2 View  Result Date: 03/29/2021 CLINICAL DATA:  Chest pain EXAM: CHEST - 2 VIEW COMPARISON:  06/03/2020 FINDINGS: Mild cardiomegaly. Both lungs are clear. Disc degenerative disease of the thoracic spine. IMPRESSION: Mild cardiomegaly without acute abnormality of the lungs. Electronically Signed   By: Delanna Ahmadi M.D.   On: 03/29/2021 15:04    Procedures Procedures  Patient was on cardiac monitoring while in ED  Medications Ordered in ED Medications  sodium chloride 0.9 % bolus 1,000 mL (0 mLs Intravenous Stopped 03/29/21 1628)  alum & mag hydroxide-simeth (MAALOX/MYLANTA) 200-200-20 MG/5ML suspension 30 mL (30 mLs Oral Given 03/29/21 1735)    And  lidocaine (XYLOCAINE) 2 % viscous mouth solution 15 mL (15 mLs Oral Given 03/29/21 1735)  nitroGLYCERIN (NITROSTAT) SL tablet 0.4 mg (0.4 mg Sublingual Given 03/29/21 1756)    ED Course/ Medical Decision Making/ A&P   HEAR Score: 6                       Medical Decision Making  Problems Addressed: Nonspecific chest pain: acute illness or injury with systemic symptoms  Amount and/or Complexity of Data Reviewed External Data Reviewed: labs, radiology, ECG and notes.    Details: Previous Echo report and  cardiology notes Labs: ordered. Decision-making details documented in ED Course. Radiology: ordered and independent interpretation performed. Decision-making details documented in ED Course. ECG/medicine tests: ordered and independent interpretation performed. Decision-making details documented in ED Course. Discussion of management or test interpretation with external provider(s): Spoke with Cardiology, Dr. Humphrey Rolls   Risk Prescription drug management. Decision regarding hospitalization.   This is a 66 y.o. female with a PMH of atrial fibrillation, obesity, hypertension, hyperlipidemia, prediabetes, OSA, asthma, and GERD who presents to the ED with chest pain for 4 days that has been exertional and at rest.  It is worse with exertion and with shortness of breath and dyspnea symptoms.  Pain radiates to her back and bilateral shoulders.  Review of Past Records: Previous echo done in December 2022 shows mild diastolic dysfunction.  Normal EF.  Per patient she had a cardiac cath in 2006 with no blockages noted no stent placement.  She has not had an extensive cardiac work-up in a while.  Initial Impression: Patient's story is concerning for ACS or cardiac cause.  We will do extensive work-up with basic labs, lipase, hepatic function panel, coags, D-dimer, BNP, respiratory panel.  I personally reviewed all laboratory work and imaging. Abnormal results outlined below. CBC with trace leukocytosis to 10.8.  Otherwise normal.  BMP with glucose 172 otherwise normal.  Lipase and hepatic function panel normal.  D-dimer negative.  BNP negative.  Troponin x2 negative.  Chest x-ray does show some mild cardiomegaly, however after review of previous echo, she did have some enlarged atri Ramm so I do not think that this is new.  EKG is is normal sinus rhythm without ischemia.  Patient symptoms did improve with nitroglycerin.  Overall he is concerned that patient is having a cardiac cause to her symptoms.  She did  have an overall negative work-up, however I think she could benefit from extensive inpatient work-up and observation given that she has multiple risk factors.  Heart score 6.  1815: spoke with Dr. Humphrey Rolls. Plan to admit for observation. Will consult hospital medicine.  Dispo Plan: Dr. Bridgett Larsson will admit patient  I have seen and evaluated this patient in conjunction with my attending physician who agrees and has made changes to the plan accordingly.  Portions of this note were generated with Lobby[employer]. Dictation errors may occur despite best attempts at proofreading.   Final Clinical Impression(s) / ED Diagnoses Final diagnoses:  Nonspecific chest pain    Rx / DC Orders ED Discharge Orders     None         Adolphus Birchwood, PA-C 03/29/21 [place], Quita Skye, DO 03/29/21 1926

## 2021-03-29 NOTE — Progress Notes (Signed)
ANTICOAGULATION CONSULT NOTE - Initial Consult  Pharmacy Consult for warfarin Indication: atrial fibrillation  Allergies  Allergen Reactions   Aspirin Other (See Comments)    Makes asthma worse and makes stomach hurt   Banana Swelling and Cough    Inside of the mouth swells   Food Other (See Comments) and Cough    Nuts- mucus membranes inflamed/cough    Ivp Dye [Iodinated Contrast Media] Anaphylaxis    Required epinephrine. Since then, has tolerated with premed.   Latex Other (See Comments) and Cough    Wheezing/cough    Meperidine Hcl Other (See Comments)    Projectile vomiting    Peanut-Containing Drug Products Swelling and Cough    Inside of the mouth swells   Penicillins Anaphylaxis    Has patient had a PCN reaction causing immediate rash, facial/tongue/throat swelling, SOB or lightheadedness with hypotension:Yes Has patient had a PCN reaction causing severe rash involving mucus membranes or skin necrosis:Yes Has patient had a PCN reaction that required hospitalization:Treated in ER w/epi & breathing treatments Has patient had a PCN reaction occurring within the last 10 years:No If all of the above answers are "NO", then may proceed with Cephalosporin use.    Statins Other (See Comments)    Severe leg pain - has tried most of them   Sulfonamide Derivatives Anaphylaxis   Tape Hives and Dermatitis    No plastic tape!!   Alfuzosin Other (See Comments)    Headache, dizziness   Other     Vascepa- upset stomach   Sulfamethoxazole Hives    Patient Measurements: Height: 5\' 4"  (162.6 cm) Weight: 102.8 kg (226 lb 9.6 oz) IBW/kg (Calculated) : 54.7   Vital Signs: Temp: 98.5 F (36.9 C) (01/07 2218) Temp Source: Oral (01/07 2218) BP: 164/74 (01/07 2218) Pulse Rate: 70 (01/07 2218)  Labs: Recent Labs    03/29/21 1522 03/29/21 1655  HGB 15.0  --   HCT 43.9  --   PLT 341  --   LABPROT 25.9*  --   INR 2.4*  --   CREATININE 0.65  --   TROPONINIHS 3 3     Estimated Creatinine Clearance: 80.7 mL/min (by C-G formula based on SCr of 0.65 mg/dL).   Medical History: Past Medical History:  Diagnosis Date   Allergic rhinitis    Arthritis    "back" (05/07/2016)   Asthma    Atrial flutter ([place])    a. Remotely per notes.   Carpal tunnel syndrome, bilateral    Colon polyps    Diverticulitis    Dizziness    DVT (deep venous thrombosis) ([place]) 1980s x2 -    between birth of two sons. Saw hematology - was told she has a hypercoagulable disorder, should be on Coumadin lifelong.   Dysrhythmia    atrial fib   Family history of adverse reaction to anesthesia    "mother gets PONV"   Gallbladder disease    GERD (gastroesophageal reflux disease)    GIB (gastrointestinal bleeding) 04/14/2018   Halothane adverse reaction    narrow small opening   Heart murmur    Hiatal hernia    History of hiatal hernia    "small one/CTA 05/01/2016" (05/07/2016)   HTN (hypertension)    Hypercholesteremia    hx; "brought it down w/diet" (05/07/2016)   Hyperglycemia    a. A1c 6.1 in 2014.   Inguinal hernia    Iron deficiency anemia due to chronic blood loss 04/14/2018   Iron malabsorption 04/14/2018   Left  sided sciatica    Normal cardiac stress test 06/2012   Obesity    OSA (obstructive sleep apnea)    a. Mild, did not tolerate CPAP (05/07/2016)   PAF (paroxysmal atrial fibrillation) ([place])    a. s/p afib ablation at [facility] 2010 (Dr. Annabell Howells). b. Recurrent AF s/p DCCV 06/2010. c. On flecainide.    Paroxysmal atrial fibrillation ([place]) 09/09/2009   Qualifier: Diagnosis of  By: Selena Batten CMA, [place]     PONV (postoperative nausea and vomiting)    PPD positive, treated 1977   "treated for 1 yr after exposure to patient"   Pre-diabetes    RSV infection ~ 2006   Vertigo    Wandering (atrial) pacemaker    a. Remotely per notes.  (  pt. states has a wandering p wave ) no pacemaker     Assessment: 66 year old female admitted to [facility] by way of [facility] with chest  pain. Patient with known afib on warfarin prior to admission. INR therapeutic on admission at 2.4. Previous warfarin regimen is 6mg  on TTSa and 4mg  all other days.    Goal of Therapy:  INR 2-3 Monitor platelets by anticoagulation protocol: Yes   Plan:  Will give 6mg  of warfarin now and follow daily INR  Erin Hearing PharmD., BCPS Clinical Pharmacist 03/29/2021 10:47 PM

## 2021-03-29 NOTE — ED Notes (Signed)
Attempted to call report to [facility]. Staff requests this RN to call back in 10 minutes.

## 2021-03-29 NOTE — ED Notes (Signed)
ED Provider at bedside. 

## 2021-03-29 NOTE — ED Triage Notes (Signed)
Pt arrives pov with c/o CP radiating to back, shob and dizziness x 6 days. Pt presents from UC after abnormal EKG, endorses nausea

## 2021-03-29 NOTE — Progress Notes (Signed)
  [facility] will assume care on arrival to accepting facility. Until arrival, care as per EDP. However, [facility] available 24/7 for questions and assistance.   Nursing staff please page [facility] Admits and Consults ([phone]) as soon as the patient arrives to the hospital.  Lashonda Sonneborn, DO [facility]  

## 2021-03-29 NOTE — H&P (Signed)
History and Physical    [name] SEG:[id number] DOB: January 29, 1956 DOA: 03/29/2021  PCP: Antony Contras, MD  Patient coming from: Home.  Chief Complaint: Chest pain.  HPI: [name] is a 66 y.o. female with history of A. fib status post ablation and history of DVT presently on Coumadin, hypertension, sleep apnea presents to the ER with complaints of having exertional chest pain for the last 1 week.  Pain is substernal with increased on exertion relieved by rest radiates to the back.  Denies any abdominal pain nausea vomiting or diarrhea has chronic dizziness.  ED Course: In the ER patient was chest pain-free EKG was showing nonspecific changes cardiac markers were negative.  Given exertional symptoms concerning for angina admitted for further observation.  Patient's INR is therapeutic.  COVID test negative.  Chest x-ray shows nothing acute.  Review of Systems: As per HPI, rest all negative.   Past Medical History:  Diagnosis Date   Allergic rhinitis    Arthritis    "back" (05/07/2016)   Asthma    Atrial flutter ([place])    a. Remotely per notes.   Carpal tunnel syndrome, bilateral    Colon polyps    Diverticulitis    Dizziness    DVT (deep venous thrombosis) ([place]) 1980s x2 -    between birth of two sons. Saw hematology - was told she has a hypercoagulable disorder, should be on Coumadin lifelong.   Dysrhythmia    atrial fib   Family history of adverse reaction to anesthesia    "mother gets PONV"   Gallbladder disease    GERD (gastroesophageal reflux disease)    GIB (gastrointestinal bleeding) 04/14/2018   Halothane adverse reaction    narrow small opening   Heart murmur    Hiatal hernia    History of hiatal hernia    "small one/CTA 05/01/2016" (05/07/2016)   HTN (hypertension)    Hypercholesteremia    hx; "brought it down w/diet" (05/07/2016)   Hyperglycemia    a. A1c 6.1 in 2014.   Inguinal hernia    Iron deficiency anemia due to chronic blood loss 04/14/2018    Iron malabsorption 04/14/2018   Left sided sciatica    Normal cardiac stress test 06/2012   Obesity    OSA (obstructive sleep apnea)    a. Mild, did not tolerate CPAP (05/07/2016)   PAF (paroxysmal atrial fibrillation) ([place])    a. s/p afib ablation at [facility] 2010 (Dr. Annabell Howells). b. Recurrent AF s/p DCCV 06/2010. c. On flecainide.    Paroxysmal atrial fibrillation ([place]) 09/09/2009   Qualifier: Diagnosis of  By: Selena Batten CMA, Jewel     PONV (postoperative nausea and vomiting)    PPD positive, treated 1977   "treated for 1 yr after exposure to patient"   Pre-diabetes    RSV infection ~ 2006   Vertigo    Wandering (atrial) pacemaker    a. Remotely per notes.  (  pt. states has a wandering p wave ) no pacemaker    Past Surgical History:  Procedure Laterality Date   ANKLE ARTHODESIS W/ ARTHROSCOPY Right 11/12/2020   ATRIAL ABLATION SURGERY  05/07/2016   "fib and flutter"   ATRIAL FIBRILLATION ABLATION  2010   at [facility] N/A 05/07/2016   Procedure: Atrial Fibrillation Ablation;  Surgeon: Thompson Grayer, MD;  Location: [facility];  Service: Cardiovascular;  Laterality: N/A;   BREAST CYST ASPIRATION Bilateral    CARDIAC CATHETERIZATION  2008   CARPAL TUNNEL RELEASE Bilateral    CESAREAN SECTION  1986; 1988   COLONOSCOPY  X 2   COLONOSCOPY W/ BIOPSIES AND POLYPECTOMY  X 2   CYSTOSCOPY/URETEROSCOPY/HOLMIUM LASER/STENT PLACEMENT Left 12/23/2019   Procedure: CYSTOSCOPY/URETEROSCOPY/HOLMIUM LASER/STENT PLACEMENT;  Surgeon: Robley Fries, MD;  Location: [facility];  Service: Urology;  Laterality: Left;   ESOPHAGOGASTRODUODENOSCOPY     ESOPHAGOGASTRODUODENOSCOPY (EGD) WITH PROPOFOL N/A 07/19/2017   Procedure: ESOPHAGOGASTRODUODENOSCOPY (EGD) WITH PROPOFOL;  Surgeon: Yetta Flock, MD;  Location: [facility];  Service: Gastroenterology;  Laterality: N/A;   INGUINAL HERNIA REPAIR Right    LAPAROSCOPIC CHOLECYSTECTOMY  1989   POLYPECTOMY N/A  07/19/2017   Procedure: POLYPECTOMY;  Surgeon: Yetta Flock, MD;  Location: [facility];  Service: Gastroenterology;  Laterality: N/A;   PROCTOSCOPY N/A 08/06/2017   Procedure: PROCTOSCOPY;  Surgeon: Michael Boston, MD;  Location: [facility];  Service: General;  Laterality: N/A;   TMJ ARTHROPLASTY     TUBAL LIGATION  1988     reports that she has never smoked. She has never used smokeless tobacco. She reports that she does not currently use alcohol. She reports that she does not use drugs.  Allergies  Allergen Reactions   Aspirin Other (See Comments)    Makes asthma worse and makes stomach hurt   Banana Swelling and Cough    Inside of the mouth swells   Food Other (See Comments) and Cough    Nuts- mucus membranes inflamed/cough    Ivp Dye [Iodinated Contrast Media] Anaphylaxis    Required epinephrine. Since then, has tolerated with premed.   Latex Other (See Comments) and Cough    Wheezing/cough    Meperidine Hcl Other (See Comments)    Projectile vomiting    Peanut-Containing Drug Products Swelling and Cough    Inside of the mouth swells   Penicillins Anaphylaxis    Has patient had a PCN reaction causing immediate rash, facial/tongue/throat swelling, SOB or lightheadedness with hypotension:Yes Has patient had a PCN reaction causing severe rash involving mucus membranes or skin necrosis:Yes Has patient had a PCN reaction that required hospitalization:Treated in ER w/epi & breathing treatments Has patient had a PCN reaction occurring within the last 10 years:No If all of the above answers are "NO", then may proceed with Cephalosporin use.    Statins Other (See Comments)    Severe leg pain - has tried most of them   Sulfonamide Derivatives Anaphylaxis   Tape Hives and Dermatitis    No plastic tape!!   Alfuzosin Other (See Comments)    Headache, dizziness   Other     Vascepa- upset stomach   Sulfamethoxazole Hives    Family History  Problem Relation Age of Onset    Hypertension Mother    Stroke Mother    Heart attack Mother        2 previous MIs, 3 stents - CAD first diagnosed 52   Thyroid disease Mother    Hyperlipidemia Father    Colon polyps Father    Lupus Sister    Thyroid disease Sister        x 2   Hyperlipidemia Maternal Grandmother    Heart attack Maternal Grandmother    Diabetes Maternal Grandmother    Hypertension Maternal Grandmother    Hypertension Maternal Grandfather    Stroke Maternal Grandfather        Multiple family members   Stroke Paternal Grandmother    Heart attack Paternal Grandfather  Died at 30 of massive MI   Colon cancer Neg Hx    Stomach cancer Neg Hx     Prior to Admission medications   Medication Sig Start Date End Date Taking? Authorizing Provider  acetaminophen (TYLENOL) 650 MG CR tablet Take 1,300 mg by mouth every 8 (eight) hours as needed for pain.     [provider]  cetirizine (ZYRTEC) 10 MG tablet Take 10 mg by mouth daily.    [provider]  Cholecalciferol (VITAMIN D3) 1000 UNITS CAPS Take 2,000 Units by mouth daily.    [provider]  dicyclomine (BENTYL) 10 MG capsule TAKE 1 CAPSULE BY MOUTH 30 MINS BEFORE MEALS AND AT BEDTIME AS NEEDED FOR PAIN/CRAMPING 12/23/18   Zehr, Laban Emperor, PA-C  ezetimibe (ZETIA) 10 MG tablet Take 1 tablet (10 mg total) by mouth daily. 04/30/20 07/27/22  HiltyNadean Corwin, MD  FLOVENT HFA 220 MCG/ACT inhaler Inhale 1 puff into the lungs 2 (two) times daily. 04/20/16   [provider]  fluticasone (FLONASE) 50 MCG/ACT nasal spray Place 1 spray into both nostrils 2 (two) times daily.  06/02/12   [provider]  linaclotide Rolan Lipa) 145 MCG CAPS capsule Take 1 capsule (145 mcg total) by mouth daily before breakfast. Exp: 08-2021, LOT: [id number] 01/23/21   Armbruster, Carlota Raspberry, MD  losartan (COZAAR) 25 MG tablet Take 1 tablet (25 mg total) by mouth daily. 12/20/13   Allred, Jeneen Rinks, MD  meclizine (ANTIVERT) 25 MG tablet Take 1 tablet (25  mg total) by mouth 3 (three) times daily as needed for dizziness. 01/19/18   Dorie Rank, MD  Multiple Vitamin (MULTIVITAMIN WITH MINERALS) TABS tablet Take 1 tablet by mouth daily.    [provider]  omeprazole (PRILOSEC) 40 MG capsule Take 1 capsule (40 mg total) by mouth 2 (two) times daily. Must have an office visit for any further refills. Thank you 05/03/19   Armbruster, Carlota Raspberry, MD  polyethylene glycol (MIRALAX) 17 g packet Take 17 g by mouth 2 (two) times daily. Titrate as needed 01/23/21   Armbruster, Carlota Raspberry, MD  PROAIR HFA 108 (90 BASE) MCG/ACT inhaler Inhale 2 puffs into the lungs every 6 (six) hours as needed for wheezing or shortness of breath.  03/22/12   [provider]  venlafaxine XR (EFFEXOR-XR) 37.5 MG 24 hr capsule Take 37.5 mg by mouth daily with breakfast.  11/21/12   [provider]  verapamil (CALAN) 80 MG tablet Take 1 tablet (80 mg total) by mouth every morning AND 2 tablets (160 mg total) every evening. 01/27/21   Allred, Jeneen Rinks, MD  warfarin (COUMADIN) 2 MG tablet TAKE AS DIRECTED BY COUMADIN CLINIC 12/12/20   Thompson Grayer, MD    Physical Exam: Constitutional: Moderately built and nourished. Vitals:   03/29/21 2030 03/29/21 2100 03/29/21 2130 03/29/21 2218  BP: (!) 154/69 (!) 158/63  (!) 164/74  Pulse: 69 72 68 70  Resp: 16 17 16 15   Temp:    98.5 F (36.9 C)  TempSrc:    Oral  SpO2: 95% 94% 95% 95%  Weight:    102.8 kg  Height:    5\' 4"  (1.626 m)   Eyes: Anicteric no pallor. ENMT: No discharge from the ears eyes nose and mouth. Neck: No mass felt.  No neck rigidity. Respiratory: No rhonchi or crepitations. Cardiovascular: S1-S2 Abdomen: Soft nontender bowel sound present. Musculoskeletal: No edema. Skin: No rash. Neurologic: Alert awake oriented time place and person.  Moves all extremities. Psychiatric:  Appears normal.  Normal affect.   Labs on Admission: I have personally reviewed following labs and imaging  studies  CBC: Recent Labs  Lab 03/29/21 1522  WBC 10.8*  HGB 15.0  HCT 43.9  MCV 90.5  PLT 662   Basic Metabolic Panel: Recent Labs  Lab 03/29/21 1522  NA 135  K 4.1  CL 99  CO2 26  GLUCOSE 172*  BUN 17  CREATININE 0.65  CALCIUM 9.4   GFR: Estimated Creatinine Clearance: 80.7 mL/min (by C-G formula based on SCr of 0.65 mg/dL). Liver Function Tests: Recent Labs  Lab 03/29/21 1522  AST 25  ALT 31  ALKPHOS 67  BILITOT 0.3  PROT 7.3  ALBUMIN 3.6   Recent Labs  Lab 03/29/21 1522  LIPASE 31   No results for input(s): AMMONIA in the last 168 hours. Coagulation Profile: Recent Labs  Lab 03/29/21 1522  INR 2.4*   Cardiac Enzymes: No results for input(s): CKTOTAL, CKMB, CKMBINDEX, TROPONINI in the last 168 hours. BNP (last 3 results) No results for input(s): PROBNP in the last 8760 hours. HbA1C: No results for input(s): HGBA1C in the last 72 hours. CBG: No results for input(s): GLUCAP in the last 168 hours. Lipid Profile: No results for input(s): CHOL, HDL, LDLCALC, TRIG, CHOLHDL, LDLDIRECT in the last 72 hours. Thyroid Function Tests: No results for input(s): TSH, T4TOTAL, FREET4, T3FREE, THYROIDAB in the last 72 hours. Anemia Panel: No results for input(s): VITAMINB12, FOLATE, FERRITIN, TIBC, IRON, RETICCTPCT in the last 72 hours. Urine analysis:    Component Value Date/Time   COLORURINE YELLOW 12/23/2019 [place] 12/23/2019 0344   LABSPEC 1.012 12/23/2019 0344   PHURINE 5.0 12/23/2019 0344   GLUCOSEU NEGATIVE 12/23/2019 0344   HGBUR SMALL (A) 12/23/2019 0344   BILIRUBINUR NEGATIVE 12/23/2019 0344   KETONESUR NEGATIVE 12/23/2019 0344   PROTEINUR NEGATIVE 12/23/2019 0344   UROBILINOGEN 1.0 11/29/2014 1300   NITRITE NEGATIVE 12/23/2019 0344   LEUKOCYTESUR MODERATE (A) 12/23/2019 0344   Sepsis Labs: @LABRCNTIP (procalcitonin:4,lacticidven:4) ) Recent Results (from the past 240 hour(s))  Resp Panel by RT-PCR (Flu A&B, Covid)  Nasopharyngeal Swab     Status: None   Collection Time: 03/29/21  3:22 PM   Specimen: Nasopharyngeal Swab; Nasopharyngeal(NP) swabs in vial transport medium  Result Value Ref Range Status   SARS Coronavirus 2 by RT PCR NEGATIVE NEGATIVE Final    Comment: (NOTE) SARS-CoV-2 target nucleic acids are NOT DETECTED.  The SARS-CoV-2 RNA is generally detectable in upper respiratory specimens during the acute phase of infection. The lowest concentration of SARS-CoV-2 viral copies this assay can detect is 138 copies/mL. A negative result does not preclude SARS-Cov-2 infection and should not be used as the sole basis for treatment or other patient management decisions. A negative result may occur with  improper specimen collection/handling, submission of specimen other than nasopharyngeal swab, presence of viral mutation(s) within the areas targeted by this assay, and inadequate number of viral copies(<138 copies/mL). A negative result must be combined with clinical observations, patient history, and epidemiological information. The expected result is Negative.  Fact Sheet for Patients:  EntrepreneurPulse.com.au  Fact Sheet for Healthcare Providers:  IncredibleEmployment.be  This test is no t yet approved or cleared by the [place] FDA and  has been authorized for detection and/or diagnosis of SARS-CoV-2 by FDA under an Emergency Use Authorization (EUA). This EUA will remain  in effect (meaning this test can be used) for the duration of the COVID-19 declaration under Section  564(b)(1) of the Act, 21 U.S.C.section 360bbb-3(b)(1), unless the authorization is terminated  or revoked sooner.       Influenza A by PCR NEGATIVE NEGATIVE Final   Influenza B by PCR NEGATIVE NEGATIVE Final    Comment: (NOTE) The Xpert Xpress SARS-CoV-2/FLU/RSV plus assay is intended as an aid in the diagnosis of influenza from Nasopharyngeal swab specimens and should not be  used as a sole basis for treatment. Nasal washings and aspirates are unacceptable for Xpert Xpress SARS-CoV-2/FLU/RSV testing.  Fact Sheet for Patients: EntrepreneurPulse.com.au  Fact Sheet for Healthcare Providers: IncredibleEmployment.be  This test is not yet approved or cleared by the [place] FDA and has been authorized for detection and/or diagnosis of SARS-CoV-2 by FDA under an Emergency Use Authorization (EUA). This EUA will remain in effect (meaning this test can be used) for the duration of the COVID-19 declaration under Section 564(b)(1) of the Act, 21 U.S.C. section 360bbb-3(b)(1), unless the authorization is terminated or revoked.  Performed at [facility], [address] [place], [place] [address]      Radiological Exams on Admission: DG Chest 2 View  Result Date: 03/29/2021 CLINICAL DATA:  Chest pain EXAM: CHEST - 2 VIEW COMPARISON:  06/03/2020 FINDINGS: Mild cardiomegaly. Both lungs are clear. Disc degenerative disease of the thoracic spine. IMPRESSION: Mild cardiomegaly without acute abnormality of the lungs. Electronically Signed   By: Delanna Ahmadi M.D.   On: 03/29/2021 15:04    EKG: Independently reviewed.  Normal sinus rhythm with nonspecific ST-T changes.  Assessment/Plan Principal Problem:   Exertional chest pain Active Problems:   Paroxysmal atrial fibrillation (HCC)   Asthma   Dizziness   History of DVT (deep vein thrombosis)   Chest pain    Exam chest pain concerning for angina.  Cardiac markers have been negative.  Patient is on Coumadin which is therapeutic at this time.  Patient also takes verapamil for heart rate control.  Consult cardiology in the morning. History of A. fib status post ablation presently on verapamil.  Coumadin follow anticoagulation but INR therapeutic. Prior history of DVT in the 70s.  INR therapeutic.  No signs of any recurrence at this time. Asthma not actively wheezing.   Continue home inhalers. Hyperglycemia check hemoglobin A1c next blood draw. History of chronic dizziness follows with neurology. Recent 2D echo done 2 weeks ago showed grade 2 diastolic dysfunction with EF of 60 to 65%. Sleep apnea on CPAP. Hypertension on losartan and verapamil. GERD on omeprazole. Hyperlipidemia on Zetia.   DVT prophylaxis: Coumadin. Code Status: Full code. Family Communication: Discussed with patient. Disposition Plan: Home. Consults called: None.  Will need to consult cardiology. Admission status: Observation.   Rise Patience MD [facility] Pager [phone].  If 7PM-7AM, please contact night-coverage [email or web address] Password [facility]  03/29/2021, 10:37 PM

## 2021-03-30 ENCOUNTER — Other Ambulatory Visit: Payer: Self-pay | Admitting: Cardiology

## 2021-03-30 DIAGNOSIS — R079 Chest pain, unspecified: Secondary | ICD-10-CM | POA: Diagnosis not present

## 2021-03-30 DIAGNOSIS — Z86718 Personal history of other venous thrombosis and embolism: Secondary | ICD-10-CM | POA: Diagnosis not present

## 2021-03-30 DIAGNOSIS — I480 Paroxysmal atrial fibrillation: Secondary | ICD-10-CM | POA: Diagnosis not present

## 2021-03-30 DIAGNOSIS — I200 Unstable angina: Secondary | ICD-10-CM

## 2021-03-30 LAB — BASIC METABOLIC PANEL
Anion gap: 8 (ref 5–15)
BUN: 18 mg/dL (ref 8–23)
CO2: 28 mmol/L (ref 22–32)
Calcium: 9.8 mg/dL (ref 8.9–10.3)
Chloride: 99 mmol/L (ref 98–111)
Creatinine, Ser: 0.78 mg/dL (ref 0.44–1.00)
GFR, Estimated: >60 mL/min (ref >60)
Glucose, Bld: 153 mg/dL — ABNORMAL HIGH (ref 70–99)
Potassium: 4.7 mmol/L (ref 3.5–5.1)
Sodium: 135 mmol/L (ref 135–145)

## 2021-03-30 LAB — CBC
HCT: 46 % (ref 36.0–46.0)
Hemoglobin: 15.5 g/dL — ABNORMAL HIGH (ref 12.0–15.0)
MCH: 30.8 pg (ref 26.0–34.0)
MCHC: 33.7 g/dL (ref 30.0–36.0)
MCV: 91.5 fL (ref 80.0–100.0)
Platelets: 318 10*3/uL (ref 150–400)
RBC: 5.03 MIL/uL (ref 3.87–5.11)
RDW: 12.9 % (ref 11.5–15.5)
WBC: 9 10*3/uL (ref 4.0–10.5)
nRBC: 0 % (ref 0.0–0.2)

## 2021-03-30 LAB — PROTIME-INR
INR: 2.5 — ABNORMAL HIGH (ref 0.8–1.2)
Prothrombin Time: 27.2 seconds — ABNORMAL HIGH (ref 11.4–15.2)

## 2021-03-30 LAB — HEMOGLOBIN A1C
Hgb A1c MFr Bld: 7.6 % — ABNORMAL HIGH (ref 4.8–5.6)
Mean Plasma Glucose: 171.42 mg/dL

## 2021-03-30 LAB — MAGNESIUM: Magnesium: 2.1 mg/dL (ref 1.7–2.4)

## 2021-03-30 LAB — HIV ANTIBODY (ROUTINE TESTING W REFLEX): HIV Screen 4th Generation wRfx: NONREACTIVE

## 2021-03-30 MED ORDER — DIPHENHYDRAMINE HCL 50 MG PO CAPS: ORAL_CAPSULE | ORAL | 0 refills | Status: DC

## 2021-03-30 MED ORDER — NITROGLYCERIN 0.4 MG SL SUBL: 0.4000 mg | SUBLINGUAL_TABLET | SUBLINGUAL | 0 refills | Status: DC | PRN

## 2021-03-30 MED ORDER — WARFARIN SODIUM 2 MG PO TABS: 4.0000 mg | ORAL_TABLET | Freq: Once | ORAL | Status: DC

## 2021-03-30 MED ORDER — PREDNISONE 50 MG PO TABS: ORAL_TABLET | ORAL | 0 refills | Status: DC

## 2021-03-30 NOTE — Discharge Summary (Signed)
Physician Discharge Summary  [name] SWN:[id number] DOB: 03-11-1956 DOA: 03/29/2021  PCP: Antony Contras, MD  Admit date: 03/29/2021 Discharge date: 03/30/2021  Time spent: 35 minutes  Recommendations for Outpatient Follow-up:  Coronary CTA with premedication for contrast allergy, to be arranged by cardiology, [facility] PCP in 1 week, follow-up hemoglobin A1c   Discharge Diagnoses:  Principal Problem:   Exertional chest pain Active Problems:   Paroxysmal atrial fibrillation (HCC)   Asthma   Dizziness   History of DVT (deep vein thrombosis)   Chest pain Hyperglycemia  Discharge Condition: Stable  Diet recommendation: Low-sodium  Filed Weights   03/29/21 1433 03/29/21 2218 03/30/21 0005  Weight: 99.8 kg 102.8 kg 102.8 kg    History of present illness:  66/F with history of atrial fibrillation status post ablation, history of DVT on Coumadin, hypertension, sleep apnea presented to the ED with exertional chest pain X 2 days, patient has had intermittent dizziness this past week, but has not been in A. Fib.  About 10 days ago had a URI, sinusitis, treated with antibiotics and steroids then, cough and congestion has improved. -Yesterday noticed chest pain while doing her laundry, and felt like a pressure, band across her chest, radiating to the back, associated with shortness of breath, went to the urgent care and was sent to the ED. -EKG with no acute ST-T wave changes, high-sensitivity troponin and chest x-ray are negative, vital stable, in sinus rhythm    Hospital Course:   Exertional chest pain, ?  Angina -High-sensitivity troponin is negative, EKG without acute ST-T wave changes -Seen by cardiology in consultation, recommended coronary CTA, history of anaphylaxis with contrast but has completed month multiple studies in the past with premedication.  Cardiology recommended discharge home, they will arrange outpatient CT with premedication for contrast allergy as  outpatient, she was also given a prescription for sublingual nitroglycerin.   History of atrial fibrillation -Status post ablation, in sinus rhythm  -continue verapamil, Coumadin   History of DVT in the 70s -Without recurrence,, remains on Coumadin-likely secondary to A. Fib   Hyperglycemia -Follow-up hemoglobin A1c   Grade 2 diastolic dysfunction -Clinically no evidence of fluid overload at this time   Obesity -BMI 38.9  Consultations: Cardiology Discharge Exam: Vitals:   03/30/21 0732 03/30/21 1045  BP: (!) 154/72 (!) 152/77  Pulse: 69 73  Resp: 19 18  Temp: 98.7 F (37.1 C) 98.2 F (36.8 C)  SpO2: 96% 96%   General exam: Obese pleasant female, sitting up in bed, AAOx3, no distress HEENT: JVD CVS: S1-S2, regular rate rhythm Lungs: Clear bilaterally Abdomen: Obese, soft, nontender, bowel sounds present Extremities: No edema Skin: No rash on exposed skin   Discharge Instructions   Discharge Instructions     Diet - low sodium heart healthy   Complete by: As directed    Increase activity slowly   Complete by: As directed       Allergies as of 03/30/2021       Reactions   Aspirin Other (See Comments)   Makes asthma worse and makes stomach hurt   Banana Swelling, Cough   Inside of the mouth swells   Food Other (See Comments), Cough   Nuts- mucus membranes inflamed/cough   Ivp Dye [iodinated Contrast Media] Anaphylaxis   Required epinephrine. Since then, has tolerated with premed.   Latex Other (See Comments), Cough   Wheezing/cough   Meperidine Hcl Other (See Comments)   Projectile vomiting   Peanut-containing Drug Products  Swelling, Cough   Inside of the mouth swells   Penicillins Anaphylaxis   Has patient had a PCN reaction causing immediate rash, facial/tongue/throat swelling, SOB or lightheadedness with hypotension:Yes Has patient had a PCN reaction causing severe rash involving mucus membranes or skin necrosis:Yes Has patient had a PCN reaction  that required hospitalization:Treated in ER w/epi & breathing treatments Has patient had a PCN reaction occurring within the last 10 years:No If all of the above answers are "NO", then may proceed with Cephalosporin use.   Statins Other (See Comments)   Severe leg pain - has tried most of them   Sulfonamide Derivatives Anaphylaxis   Tape Hives, Dermatitis   No plastic tape!!   Alfuzosin Other (See Comments)   Headache, dizziness   Other Other (See Comments)   Vascepa- upset stomach   Sulfamethoxazole Hives        Medication List     STOP taking these medications    linaclotide 145 MCG Caps capsule Commonly known as: LINZESS       TAKE these medications    acetaminophen 650 MG CR tablet Commonly known as: TYLENOL Take 1,300 mg by mouth every 8 (eight) hours as needed for pain.   cetirizine 10 MG tablet Commonly known as: ZYRTEC Take 10 mg by mouth daily.   dicyclomine 10 MG capsule Commonly known as: BENTYL TAKE 1 CAPSULE BY MOUTH 30 MINS BEFORE MEALS AND AT BEDTIME AS NEEDED FOR PAIN/CRAMPING What changed: See the new instructions.   diphenhydrAMINE 50 MG capsule Commonly known as: BENADRYL Take one capsule 1 hour prior to scan.   ezetimibe 10 MG tablet Commonly known as: ZETIA Take 1 tablet (10 mg total) by mouth daily.   Flovent HFA 220 MCG/ACT inhaler Generic drug: fluticasone Inhale 1 puff into the lungs 2 (two) times daily.   fluticasone 50 MCG/ACT nasal spray Commonly known as: FLONASE Place 1 spray into both nostrils 2 (two) times daily.   losartan 25 MG tablet Commonly known as: COZAAR Take 1 tablet (25 mg total) by mouth daily.   meclizine 25 MG tablet Commonly known as: ANTIVERT Take 1 tablet (25 mg total) by mouth 3 (three) times daily as needed for dizziness.   multivitamin with minerals Tabs tablet Take 1 tablet by mouth daily.   nitroGLYCERIN 0.4 MG SL tablet Commonly known as: Nitrostat Place 1 tablet (0.4 mg total) under the  tongue every 5 (five) minutes as needed.   omeprazole 40 MG capsule Commonly known as: PRILOSEC Take 1 capsule (40 mg total) by mouth 2 (two) times daily. Must have an office visit for any further refills. Thank you What changed:  when to take this additional instructions   polyethylene glycol 17 g packet Commonly known as: MiraLax Take 17 g by mouth 2 (two) times daily. Titrate as needed What changed:  when to take this reasons to take this additional instructions   predniSONE 50 MG tablet Commonly known as: DELTASONE Take one tablet 13 hours, 7 hours, and 1 hour prior to scan.   ProAir HFA 108 (90 Base) MCG/ACT inhaler Generic drug: albuterol Inhale 2 puffs into the lungs every 6 (six) hours as needed for wheezing or shortness of breath.   venlafaxine XR 37.5 MG 24 hr capsule Commonly known as: EFFEXOR-XR Take 37.5 mg by mouth daily with breakfast.   verapamil 80 MG tablet Commonly known as: Calan Take 1 tablet (80 mg total) by mouth every morning AND 2 tablets (160 mg total) every evening. What  changed: See the new instructions.   Vitamin D3 25 MCG (1000 UT) Caps Take 2,000 Units by mouth daily.   warfarin 2 MG tablet Commonly known as: COUMADIN Take as directed. If you are unsure how to take this medication, talk to your nurse or doctor. Original instructions: TAKE AS DIRECTED BY COUMADIN CLINIC What changed: See the new instructions.       Allergies  Allergen Reactions   Aspirin Other (See Comments)    Makes asthma worse and makes stomach hurt   Banana Swelling and Cough    Inside of the mouth swells   Food Other (See Comments) and Cough    Nuts- mucus membranes inflamed/cough    Ivp Dye [Iodinated Contrast Media] Anaphylaxis    Required epinephrine. Since then, has tolerated with premed.   Latex Other (See Comments) and Cough    Wheezing/cough    Meperidine Hcl Other (See Comments)    Projectile vomiting    Peanut-Containing Drug Products Swelling  and Cough    Inside of the mouth swells   Penicillins Anaphylaxis    Has patient had a PCN reaction causing immediate rash, facial/tongue/throat swelling, SOB or lightheadedness with hypotension:Yes Has patient had a PCN reaction causing severe rash involving mucus membranes or skin necrosis:Yes Has patient had a PCN reaction that required hospitalization:Treated in ER w/epi & breathing treatments Has patient had a PCN reaction occurring within the last 10 years:No If all of the above answers are "NO", then may proceed with Cephalosporin use.    Statins Other (See Comments)    Severe leg pain - has tried most of them   Sulfonamide Derivatives Anaphylaxis   Tape Hives and Dermatitis    No plastic tape!!   Alfuzosin Other (See Comments)    Headache, dizziness   Other Other (See Comments)    Vascepa- upset stomach   Sulfamethoxazole Hives    Follow-up Information     Antony Contras, MD. Schedule an appointment as soon as possible for a visit in 1 week(s).   Specialty: Family Medicine Contact information: [address] [place] [address] [phone]                  The results of significant diagnostics from this hospitalization (including imaging, microbiology, ancillary and laboratory) are listed below for reference.    Significant Diagnostic Studies: DG Chest 2 View  Result Date: 03/29/2021 CLINICAL DATA:  Chest pain EXAM: CHEST - 2 VIEW COMPARISON:  06/03/2020 FINDINGS: Mild cardiomegaly. Both lungs are clear. Disc degenerative disease of the thoracic spine. IMPRESSION: Mild cardiomegaly without acute abnormality of the lungs. Electronically Signed   By: Delanna Ahmadi M.D.   On: 03/29/2021 15:04   ECHOCARDIOGRAM COMPLETE  Result Date: 03/15/2021    ECHOCARDIOGRAM REPORT   Patient Name:   [name] Date of Exam: [birth year] Medical Rec #:  [id number]        Height:       64.0 in Accession #:    [id number]       Weight:       232.6 lb Date of  Birth:  21-Mar-1956        BSA:          2.086 m Patient Age:    30 years         BP:           128/72 mmHg Patient Gender: F  HR:           76 bpm. Exam Location:  [address] Procedure: 2D Echo, Cardiac Doppler and Color Doppler Indications:    I48.0 Atrial Fibrillation  History:        Patient has prior history of Echocardiogram examinations, most                 recent 09/02/2015. Arrythmias:Atrial Fibrillation and Atrial                 Flutter, Signs/Symptoms:Dizziness/Lightheadedness; Risk                 Factors:Hypertension, Dyslipidemia, Sleep Apnea and Family                 History of Coronary Artery Disease. Obesity, history of DVT.  Sonographer:    Deliah Boston RDCS Referring Phys: [place]  1. Left ventricular ejection fraction, by estimation, is 60 to 65%. The left ventricle has normal function. The left ventricle has no regional wall motion abnormalities. Left ventricular diastolic parameters are consistent with Grade II diastolic dysfunction (pseudonormalization). Elevated left ventricular end-diastolic pressure.  2. Right ventricular systolic function is normal. The right ventricular size is normal.  3. Left atrial size was mild to moderately dilated.  4. The mitral valve is grossly normal. Mild mitral valve regurgitation. No evidence of mitral stenosis.  5. The aortic valve is grossly normal. Aortic valve regurgitation is not visualized. No aortic stenosis is present.  6. The inferior vena cava is normal in size with <50% respiratory variability, suggesting right atrial pressure of 8 mmHg. Comparison(s): No significant change from prior study. Conclusion(s)/Recommendation(s): Otherwise normal echocardiogram, with minor abnormalities described in the report. FINDINGS  Left Ventricle: Left ventricular ejection fraction, by estimation, is 60 to 65%. The left ventricle has normal function. The left ventricle has no regional wall motion abnormalities. The left  ventricular internal cavity size was normal in size. There is  no left ventricular hypertrophy. Left ventricular diastolic parameters are consistent with Grade II diastolic dysfunction (pseudonormalization). Elevated left ventricular end-diastolic pressure. Right Ventricle: The right ventricular size is normal. Right vetricular wall thickness was not well visualized. Right ventricular systolic function is normal. Left Atrium: Left atrial size was mild to moderately dilated. Right Atrium: Right atrial size was normal in size. Pericardium: Trivial pericardial effusion is present. Presence of epicardial fat layer. Mitral Valve: The mitral valve is grossly normal. There is mild thickening of the mitral valve leaflet(s). There is mild calcification of the mitral valve leaflet(s). Mild mitral annular calcification. Mild mitral valve regurgitation. No evidence of mitral valve stenosis. Tricuspid Valve: The tricuspid valve is grossly normal. Tricuspid valve regurgitation is trivial. No evidence of tricuspid stenosis. Aortic Valve: The aortic valve is grossly normal. Aortic valve regurgitation is not visualized. No aortic stenosis is present. Aortic valve mean gradient measures 6.0 mmHg. Aortic valve peak gradient measures 10.8 mmHg. Aortic valve area, by VTI measures  2.49 cm. Pulmonic Valve: The pulmonic valve was not well visualized. Pulmonic valve regurgitation is not visualized. Aorta: The aortic root, ascending aorta and aortic arch are all structurally normal, with no evidence of dilitation or obstruction. Venous: The inferior vena cava is normal in size with less than 50% respiratory variability, suggesting right atrial pressure of 8 mmHg. IAS/Shunts: The atrial septum is grossly normal.  LEFT VENTRICLE PLAX 2D LVIDd:         4.30 cm   Diastology LVIDs:  2.50 cm   LV e' medial:    6.09 cm/s LV PW:         1.10 cm   LV E/e' medial:  17.9 LV IVS:        1.00 cm   LV e' lateral:   6.53 cm/s LVOT diam:     2.20  cm   LV E/e' lateral: 16.7 LV SV:         84 LV SV Index:   40 LVOT Area:     3.80 cm  RIGHT VENTRICLE RV S prime:     12.70 cm/s TAPSE (M-mode): 2.3 cm LEFT ATRIUM             Index        RIGHT ATRIUM           Index LA Vol (A2C):   70.5 ml 33.80 ml/m  RA Area:     11.50 cm LA Vol (A4C):   57.4 ml 27.52 ml/m  RA Volume:   25.40 ml  12.18 ml/m LA Biplane Vol: 64.7 ml 31.02 ml/m  AORTIC VALVE AV Area (Vmax):    2.60 cm AV Area (Vmean):   2.46 cm AV Area (VTI):     2.49 cm AV Vmax:           164.50 cm/s AV Vmean:          115.500 cm/s AV VTI:            0.338 m AV Peak Grad:      10.8 mmHg AV Mean Grad:      6.0 mmHg LVOT Vmax:         112.50 cm/s LVOT Vmean:        74.650 cm/s LVOT VTI:          0.222 m LVOT/AV VTI ratio: 0.65  AORTA Ao Root diam: 3.10 cm Ao Asc diam:  3.40 cm MITRAL VALVE MV Area (PHT): cm          SHUNTS MV Decel Time: 229 msec     Systemic VTI:  0.22 m MV E velocity: 109.00 cm/s  Systemic Diam: 2.20 cm MV A velocity: 108.00 cm/s MV E/A ratio:  1.01 Buford Dresser MD Electronically signed by Buford Dresser MD Signature Date/Time: 03/15/2021/1:54:43 PM    Final     Microbiology: Recent Results (from the past 240 hour(s))  Resp Panel by RT-PCR (Flu A&B, Covid) Nasopharyngeal Swab     Status: None   Collection Time: 03/29/21  3:22 PM   Specimen: Nasopharyngeal Swab; Nasopharyngeal(NP) swabs in vial transport medium  Result Value Ref Range Status   SARS Coronavirus 2 by RT PCR NEGATIVE NEGATIVE Final    Comment: (NOTE) SARS-CoV-2 target nucleic acids are NOT DETECTED.  The SARS-CoV-2 RNA is generally detectable in upper respiratory specimens during the acute phase of infection. The lowest concentration of SARS-CoV-2 viral copies this assay can detect is 138 copies/mL. A negative result does not preclude SARS-Cov-2 infection and should not be used as the sole basis for treatment or other patient management decisions. A negative result may occur with  improper  specimen collection/handling, submission of specimen other than nasopharyngeal swab, presence of viral mutation(s) within the areas targeted by this assay, and inadequate number of viral copies(<138 copies/mL). A negative result must be combined with clinical observations, patient history, and epidemiological information. The expected result is Negative.  Fact Sheet for Patients:  EntrepreneurPulse.com.au  Fact Sheet for Healthcare Providers:  IncredibleEmployment.be  This test is  no t yet approved or cleared by the [place] and  has been authorized for detection and/or diagnosis of SARS-CoV-2 by FDA under an Emergency Use Authorization (EUA). This EUA will remain  in effect (meaning this test can be used) for the duration of the COVID-19 declaration under Section 564(b)(1) of the Act, 21 U.S.C.section 360bbb-3(b)(1), unless the authorization is terminated  or revoked sooner.       Influenza A by PCR NEGATIVE NEGATIVE Final   Influenza B by PCR NEGATIVE NEGATIVE Final    Comment: (NOTE) The Xpert Xpress SARS-CoV-2/FLU/RSV plus assay is intended as an aid in the diagnosis of influenza from Nasopharyngeal swab specimens and should not be used as a sole basis for treatment. Nasal washings and aspirates are unacceptable for Xpert Xpress SARS-CoV-2/FLU/RSV testing.  Fact Sheet for Patients: EntrepreneurPulse.com.au  Fact Sheet for Healthcare Providers: IncredibleEmployment.be  This test is not yet approved or cleared by the [place] FDA and has been authorized for detection and/or diagnosis of SARS-CoV-2 by FDA under an Emergency Use Authorization (EUA). This EUA will remain in effect (meaning this test can be used) for the duration of the COVID-19 declaration under Section 564(b)(1) of the Act, 21 U.S.C. section 360bbb-3(b)(1), unless the authorization is terminated or revoked.  Performed at  [facility], [address] [place], [place] [address]      Labs: Basic Metabolic Panel: Recent Labs  Lab 03/29/21 1522 03/30/21 0434 03/30/21 0900  NA 135 135  --   K 4.1 4.7  --   CL 99 99  --   CO2 26 28  --   GLUCOSE 172* 153*  --   BUN 17 18  --   CREATININE 0.65 0.78  --   CALCIUM 9.4 9.8  --   MG  --   --  2.1   Liver Function Tests: Recent Labs  Lab 03/29/21 1522  AST 25  ALT 31  ALKPHOS 67  BILITOT 0.3  PROT 7.3  ALBUMIN 3.6   Recent Labs  Lab 03/29/21 1522  LIPASE 31   No results for input(s): AMMONIA in the last 168 hours. CBC: Recent Labs  Lab 03/29/21 1522 03/30/21 0434  WBC 10.8* 9.0  HGB 15.0 15.5*  HCT 43.9 46.0  MCV 90.5 91.5  PLT 341 318   Cardiac Enzymes: No results for input(s): CKTOTAL, CKMB, CKMBINDEX, TROPONINI in the last 168 hours. BNP: BNP (last 3 results) Recent Labs    03/29/21 1522  BNP 26.1    ProBNP (last 3 results) No results for input(s): PROBNP in the last 8760 hours.  CBG: No results for input(s): GLUCAP in the last 168 hours.     Signed:  Domenic Polite MD.  [facility] 03/30/2021, 11:52 AM

## 2021-03-30 NOTE — Progress Notes (Signed)
Discharge instructions provided to patient. All medications, follow up appointments, and discharge instructions provided. IV out. Monitor off CCMD notified. Discharging to home with husband.  Era Bumpers, RN

## 2021-03-30 NOTE — Consult Note (Signed)
Cardiology Consultation:   Patient ID: [name] MRN: [id number]; DOB: 29-Oct-1955  Admit date: 03/29/2021 Date of Consult: 03/30/2021  PCP:  Antony Contras, MD   [facility] Providers Cardiologist:  Thompson Grayer, MD  Electrophysiologist:  Thompson Grayer, MD    Patient Profile:   [name] is a 66 y.o. female with a hx of Paroxysmal afib s/p ablation, OSA, HTN, HLD, remote hx of DVT who is being seen 03/30/2021 for the evaluation of chest pain at the request of Dr. Broadus John.  History of Present Illness:   [name] is a 66 yo female with PMH noted above. She has been followed by Dr. Rayann Heman as an outpatient in regards to her atrial fibrillation. Has been on coumadin for many years in the setting of DVT as well as Afib, which is managed through the clinic. Stress testing in 2017 which was low risk, with breast attenuation artifact.   Last seen in the office on 01/2021 with Dr. Rayann Heman, overall doing well and was continued on coumadin and verapamil. Kardia mobile reviewed in the office without episodes of afib. Reported recent episodes of dizziness which were felt to be related to vertigo.  Consideration of ILR vs monitoring if recurrent episodes. Follows with neurology as an outpatient for dizziness as well.   States she's has issues with sinus infection over the past 2 weeks. Has had some shortness of breath as well. Saturday afternoon she was doing laundry and developed chest tightness/heaviness. Was leaning over putting several loads in and out of the dryer when symptoms started. Says "felt like a band around my chest". Also felt short of breath. Had to stop activity. Reports having episodes like this in the past with her afib, but she check her Kardia mobile, which showed sinus tachycardia but no Afib. Husband took to her to [place] from where she was routed to the ED for further evaluation.   In the ED her labs showed Na+ 135, K+ 4.1, Cr 0.65, BNP 26.1, hsTn 3>>3, WBC 10.8, Hgb  15, INR 2.4. EKG showed SR, 88 bpm, nonspecific changes. CXR negative for edema. Reports receiving a SL nitro in the ER with resolution of her symptoms. She was transferred to [facility] for further management. Has not had any further chest pain since receiving SL nitro.    Past Medical History:  Diagnosis Date   Allergic rhinitis    Arthritis    "back" (05/07/2016)   Asthma    Atrial flutter ([place])    a. Remotely per notes.   Carpal tunnel syndrome, bilateral    Colon polyps    Diverticulitis    Dizziness    DVT (deep venous thrombosis) ([place]) 1980s x2 -    between birth of two sons. Saw hematology - was told she has a hypercoagulable disorder, should be on Coumadin lifelong.   Dysrhythmia    atrial fib   Family history of adverse reaction to anesthesia    "mother gets PONV"   Gallbladder disease    GERD (gastroesophageal reflux disease)    GIB (gastrointestinal bleeding) 04/14/2018   Halothane adverse reaction    narrow small opening   Heart murmur    Hiatal hernia    History of hiatal hernia    "small one/CTA 05/01/2016" (05/07/2016)   HTN (hypertension)    Hypercholesteremia    hx; "brought it down w/diet" (05/07/2016)   Hyperglycemia    a. A1c 6.1 in 2014.   Inguinal hernia    Iron deficiency anemia  due to chronic blood loss 04/14/2018   Iron malabsorption 04/14/2018   Left sided sciatica    Normal cardiac stress test 06/2012   Obesity    OSA (obstructive sleep apnea)    a. Mild, did not tolerate CPAP (05/07/2016)   PAF (paroxysmal atrial fibrillation) ([place])    a. s/p afib ablation at [facility] 2010 (Dr. Annabell Howells). b. Recurrent AF s/p DCCV 06/2010. c. On flecainide.    Paroxysmal atrial fibrillation ([place]) 09/09/2009   Qualifier: Diagnosis of  By: Selena Batten CMA, Jewel     PONV (postoperative nausea and vomiting)    PPD positive, treated 1977   "treated for 1 yr after exposure to patient"   Pre-diabetes    RSV infection ~ 2006   Vertigo    Wandering (atrial) pacemaker    a.  Remotely per notes.  (  pt. states has a wandering p wave ) no pacemaker    Past Surgical History:  Procedure Laterality Date   ANKLE ARTHODESIS W/ ARTHROSCOPY Right 11/12/2020   ATRIAL ABLATION SURGERY  05/07/2016   "fib and flutter"   ATRIAL FIBRILLATION ABLATION  2010   at [facility] N/A 05/07/2016   Procedure: Atrial Fibrillation Ablation;  Surgeon: Thompson Grayer, MD;  Location: [facility];  Service: Cardiovascular;  Laterality: N/A;   BREAST CYST ASPIRATION Bilateral    CARDIAC CATHETERIZATION  2008   CARPAL TUNNEL RELEASE Bilateral    CESAREAN SECTION  1986; 1988   COLONOSCOPY  X 2   COLONOSCOPY W/ BIOPSIES AND POLYPECTOMY  X 2   CYSTOSCOPY/URETEROSCOPY/HOLMIUM LASER/STENT PLACEMENT Left 12/23/2019   Procedure: CYSTOSCOPY/URETEROSCOPY/HOLMIUM LASER/STENT PLACEMENT;  Surgeon: Robley Fries, MD;  Location: [facility];  Service: Urology;  Laterality: Left;   ESOPHAGOGASTRODUODENOSCOPY     ESOPHAGOGASTRODUODENOSCOPY (EGD) WITH PROPOFOL N/A 07/19/2017   Procedure: ESOPHAGOGASTRODUODENOSCOPY (EGD) WITH PROPOFOL;  Surgeon: Yetta Flock, MD;  Location: [facility];  Service: Gastroenterology;  Laterality: N/A;   INGUINAL HERNIA REPAIR Right    LAPAROSCOPIC CHOLECYSTECTOMY  1989   POLYPECTOMY N/A 07/19/2017   Procedure: POLYPECTOMY;  Surgeon: Yetta Flock, MD;  Location: [facility];  Service: Gastroenterology;  Laterality: N/A;   PROCTOSCOPY N/A 08/06/2017   Procedure: PROCTOSCOPY;  Surgeon: Michael Boston, MD;  Location: [facility];  Service: General;  Laterality: N/A;   TMJ ARTHROPLASTY     TUBAL LIGATION  1988     Home Medications:  Prior to Admission medications   Medication Sig Start Date End Date Taking? Authorizing Provider  acetaminophen (TYLENOL) 650 MG CR tablet Take 1,300 mg by mouth every 8 (eight) hours as needed for pain.     [provider]  cetirizine (ZYRTEC) 10 MG tablet Take 10 mg by mouth daily.     [provider]  Cholecalciferol (VITAMIN D3) 1000 UNITS CAPS Take 2,000 Units by mouth daily.    [provider]  dicyclomine (BENTYL) 10 MG capsule TAKE 1 CAPSULE BY MOUTH 30 MINS BEFORE MEALS AND AT BEDTIME AS NEEDED FOR PAIN/CRAMPING 12/23/18   Zehr, Laban Emperor, PA-C  ezetimibe (ZETIA) 10 MG tablet Take 1 tablet (10 mg total) by mouth daily. 04/30/20 07/27/22  HiltyNadean Corwin, MD  FLOVENT HFA 220 MCG/ACT inhaler Inhale 1 puff into the lungs 2 (two) times daily. 04/20/16   [provider]  fluticasone (FLONASE) 50 MCG/ACT nasal spray Place 1 spray into both nostrils 2 (two) times daily.  06/02/12   [provider]  linaclotide Rolan Lipa) 145 MCG  CAPS capsule Take 1 capsule (145 mcg total) by mouth daily before breakfast. Exp: 08-2021, LOT: [id number] 01/23/21   Armbruster, Carlota Raspberry, MD  losartan (COZAAR) 25 MG tablet Take 1 tablet (25 mg total) by mouth daily. 12/20/13   Allred, Jeneen Rinks, MD  [name] (ANTIVERT) 25 MG tablet Take 1 tablet (25 mg total) by mouth 3 (three) times daily as needed for dizziness. 01/19/18   Dorie Rank, MD  Multiple Vitamin (MULTIVITAMIN WITH MINERALS) TABS tablet Take 1 tablet by mouth daily.    [provider]  omeprazole (PRILOSEC) 40 MG capsule Take 1 capsule (40 mg total) by mouth 2 (two) times daily. Must have an office visit for any further refills. Thank you 05/03/19   Armbruster, Carlota Raspberry, MD  polyethylene glycol (MIRALAX) 17 g packet Take 17 g by mouth 2 (two) times daily. Titrate as needed 01/23/21   Armbruster, Carlota Raspberry, MD  PROAIR HFA 108 (90 BASE) MCG/ACT inhaler Inhale 2 puffs into the lungs every 6 (six) hours as needed for wheezing or shortness of breath.  03/22/12   [provider]  venlafaxine XR (EFFEXOR-XR) 37.5 MG 24 hr capsule Take 37.5 mg by mouth daily with breakfast.  11/21/12   [provider]  verapamil (CALAN) 80 MG tablet Take 1 tablet (80 mg total) by mouth every morning AND 2 tablets (160 mg total)  every evening. 01/27/21   Allred, Jeneen Rinks, MD  warfarin (COUMADIN) 2 MG tablet TAKE AS DIRECTED BY COUMADIN CLINIC 12/12/20   Thompson Grayer, MD    Inpatient Medications: Scheduled Meds:  budesonide  0.25 mg Nebulization BID   ezetimibe  10 mg Oral Daily   losartan  25 mg Oral Daily   pantoprazole  40 mg Oral Daily   venlafaxine XR  37.5 mg Oral Q breakfast   verapamil  80 mg Oral Q12H   warfarin  4 mg Oral ONCE-1600   warfarin  6 mg Oral Once   Warfarin - Pharmacist Dosing Inpatient   Does not apply q1600   Continuous Infusions:  PRN Meds: albuterol  Allergies:    Allergies  Allergen Reactions   Aspirin Other (See Comments)    Makes asthma worse and makes stomach hurt   Banana Swelling and Cough    Inside of the mouth swells   Food Other (See Comments) and Cough    Nuts- mucus membranes inflamed/cough    Ivp Dye [Iodinated Contrast Media] Anaphylaxis    Required epinephrine. Since then, has tolerated with premed.   Latex Other (See Comments) and Cough    Wheezing/cough    Meperidine Hcl Other (See Comments)    Projectile vomiting    Peanut-Containing Drug Products Swelling and Cough    Inside of the mouth swells   Penicillins Anaphylaxis    Has patient had a PCN reaction causing immediate rash, facial/tongue/throat swelling, SOB or lightheadedness with hypotension:Yes Has patient had a PCN reaction causing severe rash involving mucus membranes or skin necrosis:Yes Has patient had a PCN reaction that required hospitalization:Treated in ER w/epi & breathing treatments Has patient had a PCN reaction occurring within the last 10 years:No If all of the above answers are "NO", then may proceed with Cephalosporin use.    Statins Other (See Comments)    Severe leg pain - has tried most of them   Sulfonamide Derivatives Anaphylaxis   Tape Hives and Dermatitis    No plastic tape!!   Alfuzosin Other (See Comments)    Headache, dizziness   Other Other (  See Comments)     Vascepa- upset stomach   Sulfamethoxazole Hives    Social History:   Social History   Socioeconomic History   Marital status: Married    Spouse name: Dominica Severin   Number of children: 2   Years of education: Not on file   Highest education level: Master's degree (e.g., MA, MS, MEng, MEd, MSW, MBA)  Occupational History   Occupation: Programmer, multimedia: [employer]  Tobacco Use   Smoking status: Never   Smokeless tobacco: Never  Vaping Use   Vaping Use: Never used  Substance and Sexual Activity   Alcohol use: Not Currently    Comment: rare   Drug use: No   Sexual activity: Not Currently  Other Topics Concern   Not on file  Social History Narrative   Lives with husband   Social Determinants of Health   Financial Resource Strain: Not on file  Food Insecurity: Not on file  Transportation Needs: Not on file  Physical Activity: Not on file  Stress: Not on file  Social Connections: Not on file  Intimate Partner Violence: Not on file    Family History:    Family History  Problem Relation Age of Onset   Hypertension Mother    Stroke Mother    Heart attack Mother        2 previous MIs, 3 stents - CAD first diagnosed 59   Thyroid disease Mother    Hyperlipidemia Father    Colon polyps Father    Lupus Sister    Thyroid disease Sister        x 2   Hyperlipidemia Maternal Grandmother    Heart attack Maternal Grandmother    Diabetes Maternal Grandmother    Hypertension Maternal Grandmother    Hypertension Maternal Grandfather    Stroke Maternal Grandfather        Multiple family members   Stroke Paternal Grandmother    Heart attack Paternal Grandfather        Died at 61 of massive MI   Colon cancer Neg Hx    Stomach cancer Neg Hx      ROS:  Please see the history of present illness.   All other ROS reviewed and negative.     Physical Exam/Data:   Vitals:   03/30/21 0005 03/30/21 0545 03/30/21 0728 03/30/21 0732  BP: 138/60 (!) 150/66  (!) 154/72   Pulse: 72 (!) 56 66 69  Resp: 15 16 20 19   Temp: 98.6 F (37 C) 98.2 F (36.8 C)  98.7 F (37.1 C)  TempSrc: Oral Oral  Oral  SpO2: 94% 95%  96%  Weight: 102.8 kg     Height:        Intake/Output Summary (Last 24 hours) at 03/30/2021 1026 Last data filed at 03/30/2021 3903 Gross per 24 hour  Intake 1360 ml  Output 1075 ml  Net 285 ml   Last 3 Weights 03/30/2021 03/29/2021 03/29/2021  Weight (lbs) 226 lb 9.6 oz 226 lb 9.6 oz 220 lb  Weight (kg) 102.785 kg 102.785 kg 99.791 kg     Body mass index is 38.9 kg/m.  General:  Well nourished, well developed, in no acute distress HEENT: normal Neck: no JVD Vascular: No carotid bruits; Distal pulses 2+ bilaterally Cardiac:  normal S1, S2; RRR; no murmur  Lungs:  clear to auscultation bilaterally, no wheezing, rhonchi or rales  Abd: soft, nontender, no hepatomegaly  Ext: trace bilateral LE edema Musculoskeletal:  No  deformities, BUE and BLE strength normal and equal Skin: warm and dry  Neuro:  CNs 2-12 intact, no focal abnormalities noted Psych:  Normal affect   EKG:  The EKG was personally reviewed and demonstrates:  SR, 88 bpm, nonspecific changes Telemetry:  Telemetry was personally reviewed and demonstrates:  SR rates 60-70s  Relevant CV Studies:  Echo: 03/14/2021  IMPRESSIONS     1. Left ventricular ejection fraction, by estimation, is 60 to 65%. The  left ventricle has normal function. The left ventricle has no regional  wall motion abnormalities. Left ventricular diastolic parameters are  consistent with Grade II diastolic  dysfunction (pseudonormalization). Elevated left ventricular end-diastolic  pressure.   2. Right ventricular systolic function is normal. The right ventricular  size is normal.   3. Left atrial size was mild to moderately dilated.   4. The mitral valve is grossly normal. Mild mitral valve regurgitation.  No evidence of mitral stenosis.   5. The aortic valve is grossly normal. Aortic valve  regurgitation is not  visualized. No aortic stenosis is present.   6. The inferior vena cava is normal in size with <50% respiratory  variability, suggesting right atrial pressure of 8 mmHg.   Comparison(s): No significant change from prior study.   Conclusion(s)/Recommendation(s): Otherwise normal echocardiogram, with  minor abnormalities described in the report.   FINDINGS   Left Ventricle: Left ventricular ejection fraction, by estimation, is 60  to 65%. The left ventricle has normal function. The left ventricle has no  regional wall motion abnormalities. The left ventricular internal cavity  size was normal in size. There is   no left ventricular hypertrophy. Left ventricular diastolic parameters  are consistent with Grade II diastolic dysfunction (pseudonormalization).  Elevated left ventricular end-diastolic pressure.   Right Ventricle: The right ventricular size is normal. Right vetricular  wall thickness was not well visualized. Right ventricular systolic  function is normal.   Left Atrium: Left atrial size was mild to moderately dilated.   Right Atrium: Right atrial size was normal in size.   Pericardium: Trivial pericardial effusion is present. Presence of  epicardial fat layer.   Mitral Valve: The mitral valve is grossly normal. There is mild thickening  of the mitral valve leaflet(s). There is mild calcification of the mitral  valve leaflet(s). Mild mitral annular calcification. Mild mitral valve  regurgitation. No evidence of  mitral valve stenosis.   Tricuspid Valve: The tricuspid valve is grossly normal. Tricuspid valve  regurgitation is trivial. No evidence of tricuspid stenosis.   Aortic Valve: The aortic valve is grossly normal. Aortic valve  regurgitation is not visualized. No aortic stenosis is present. Aortic  valve mean gradient measures 6.0 mmHg. Aortic valve peak gradient measures  10.8 mmHg. Aortic valve area, by VTI measures   2.49 cm.    Pulmonic Valve: The pulmonic valve was not well visualized. Pulmonic valve  regurgitation is not visualized.   Aorta: The aortic root, ascending aorta and aortic arch are all  structurally normal, with no evidence of dilitation or obstruction.   Venous: The inferior vena cava is normal in size with less than 50%  respiratory variability, suggesting right atrial pressure of 8 mmHg.   IAS/Shunts: The atrial septum is grossly normal.   Laboratory Data:  High Sensitivity Troponin:   Recent Labs  Lab 03/29/21 1522 03/29/21 1655  TROPONINIHS 3 3     Chemistry Recent Labs  Lab 03/29/21 1522 03/30/21 0434  NA 135 135  K 4.1 4.7  CL 99 99  CO2 26 28  GLUCOSE 172* 153*  BUN 17 18  CREATININE 0.65 0.78  CALCIUM 9.4 9.8  GFRNONAA >60 >60  ANIONGAP 10 8    Recent Labs  Lab 03/29/21 1522  PROT 7.3  ALBUMIN 3.6  AST 25  ALT 31  ALKPHOS 67  BILITOT 0.3   Lipids No results for input(s): CHOL, TRIG, HDL, LABVLDL, LDLCALC, CHOLHDL in the last 168 hours.  Hematology Recent Labs  Lab 03/29/21 1522 03/30/21 0434  WBC 10.8* 9.0  RBC 4.85 5.03  HGB 15.0 15.5*  HCT 43.9 46.0  MCV 90.5 91.5  MCH 30.9 30.8  MCHC 34.2 33.7  RDW 12.8 12.9  PLT 341 318   Thyroid No results for input(s): TSH, FREET4 in the last 168 hours.  BNP Recent Labs  Lab 03/29/21 1522  BNP 26.1    DDimer  Recent Labs  Lab 03/29/21 1522  DDIMER 0.40     Radiology/Studies:  DG Chest 2 View  Result Date: 03/29/2021 CLINICAL DATA:  Chest pain EXAM: CHEST - 2 VIEW COMPARISON:  06/03/2020 FINDINGS: Mild cardiomegaly. Both lungs are clear. Disc degenerative disease of the thoracic spine. IMPRESSION: Mild cardiomegaly without acute abnormality of the lungs. Electronically Signed   By: Delanna Ahmadi M.D.   On: 03/29/2021 15:04     Assessment and Plan:   MAICIE VANDERLOOP is a 66 y.o. female with a hx of Paroxysmal afib s/p ablation, OSA, HTN, HLD, remote hx of DVT who is being seen 03/30/2021 for  the evaluation of chest pain at the request of Dr. Broadus John.  Chest pain: reports onset while doing laundry on Saturday afternoon. Presented to the ED with symptoms, which resolved after SL nitro. hsTn negative x2. EKG would ischemic changes. Does have risk factors with HTN, HLD (not on statin 2/2 myalgias) obesity. Discussed coronary CT, though she would need pre-meds with her dye allergy. Therefore could not do until tomorrow. MD to follow up regarding inpt vs outpt testing  Paroxsymal Afib: hx of the same. Currently in SR -- continue on coumadin, verapamil   HTN: borderline elevated -- continue on losartan 25mg  daily, verapamil 80mg  TID (home dose)  HLD: statin intolerance  -- on Zetia -- update Lipids  Risk Assessment/Risk Scores:      :[id number]}   HEAR Score (for undifferentiated chest pain):  HEAR Score: 5{   CHA2DS2-VASc Score = 5   This indicates a 7.2% annual risk of stroke. The patient's score is based upon: CHF History: 0 HTN History: 1 Diabetes History: 1 Stroke History: 0 Vascular Disease History: 1 Age Score: 1 Gender Score: 1       For questions or updates, please contact [place] Please consult [email or web address] for contact info under    Signed, Reino Bellis, NP  03/30/2021 10:26 AM

## 2021-03-30 NOTE — Progress Notes (Signed)
ANTICOAGULATION CONSULT NOTE - Initial Consult  Pharmacy Consult for warfarin Indication: atrial fibrillation  Allergies  Allergen Reactions   Aspirin Other (See Comments)    Makes asthma worse and makes stomach hurt   Banana Swelling and Cough    Inside of the mouth swells   Food Other (See Comments) and Cough    Nuts- mucus membranes inflamed/cough    Ivp Dye [Iodinated Contrast Media] Anaphylaxis    Required epinephrine. Since then, has tolerated with premed.   Latex Other (See Comments) and Cough    Wheezing/cough    Meperidine Hcl Other (See Comments)    Projectile vomiting    Peanut-Containing Drug Products Swelling and Cough    Inside of the mouth swells   Penicillins Anaphylaxis    Has patient had a PCN reaction causing immediate rash, facial/tongue/throat swelling, SOB or lightheadedness with hypotension:Yes Has patient had a PCN reaction causing severe rash involving mucus membranes or skin necrosis:Yes Has patient had a PCN reaction that required hospitalization:Treated in ER w/epi & breathing treatments Has patient had a PCN reaction occurring within the last 10 years:No If all of the above answers are "NO", then may proceed with Cephalosporin use.    Statins Other (See Comments)    Severe leg pain - has tried most of them   Sulfonamide Derivatives Anaphylaxis   Tape Hives and Dermatitis    No plastic tape!!   Alfuzosin Other (See Comments)    Headache, dizziness   Other     Vascepa- upset stomach   Sulfamethoxazole Hives    Patient Measurements: Height: 5\' 4"  (162.6 cm) Weight: 102.8 kg (226 lb 9.6 oz) IBW/kg (Calculated) : 54.7   Vital Signs: Temp: 98.7 F (37.1 C) (01/08 0732) Temp Source: Oral (01/08 0732) BP: 154/72 (01/08 0732) Pulse Rate: 69 (01/08 0732)  Labs: Recent Labs    03/29/21 1522 03/29/21 1655 03/30/21 0434  HGB 15.0  --  15.5*  HCT 43.9  --  46.0  PLT 341  --  318  LABPROT 25.9*  --  27.2*  INR 2.4*  --  2.5*  CREATININE  0.65  --  0.78  TROPONINIHS 3 3  --      Estimated Creatinine Clearance: 80.7 mL/min (by C-G formula based on SCr of 0.78 mg/dL).   Medical History: Past Medical History:  Diagnosis Date   Allergic rhinitis    Arthritis    "back" (05/07/2016)   Asthma    Atrial flutter ([place])    a. Remotely per notes.   Carpal tunnel syndrome, bilateral    Colon polyps    Diverticulitis    Dizziness    DVT (deep venous thrombosis) ([place]) 1980s x2 -    between birth of two sons. Saw hematology - was told she has a hypercoagulable disorder, should be on Coumadin lifelong.   Dysrhythmia    atrial fib   Family history of adverse reaction to anesthesia    "mother gets PONV"   Gallbladder disease    GERD (gastroesophageal reflux disease)    GIB (gastrointestinal bleeding) 04/14/2018   Halothane adverse reaction    narrow small opening   Heart murmur    Hiatal hernia    History of hiatal hernia    "small one/CTA 05/01/2016" (05/07/2016)   HTN (hypertension)    Hypercholesteremia    hx; "brought it down w/diet" (05/07/2016)   Hyperglycemia    a. A1c 6.1 in 2014.   Inguinal hernia    Iron deficiency anemia due to  chronic blood loss 04/14/2018   Iron malabsorption 04/14/2018   Left sided sciatica    Normal cardiac stress test 06/2012   Obesity    OSA (obstructive sleep apnea)    a. Mild, did not tolerate CPAP (05/07/2016)   PAF (paroxysmal atrial fibrillation) ([place])    a. s/p afib ablation at [facility] 2010 (Dr. Annabell Howells). b. Recurrent AF s/p DCCV 06/2010. c. On flecainide.    Paroxysmal atrial fibrillation ([place]) 09/09/2009   Qualifier: Diagnosis of  By: Selena Batten CMA, Jewel     PONV (postoperative nausea and vomiting)    PPD positive, treated 1977   "treated for 1 yr after exposure to patient"   Pre-diabetes    RSV infection ~ 2006   Vertigo    Wandering (atrial) pacemaker    a. Remotely per notes.  (  pt. states has a wandering p wave ) no pacemaker     Assessment: 66 year old female  admitted [facility] way of [facility] with chest pain. Patient with known afib on warfarin prior to admission.   PTA regimen = 6 mg TTSa, 4 mg all other days  INR 2.5 therapeutic (goal 2-3). CBC stable. No signs of bleeding.   Goal of Therapy:  INR 2-3 Monitor platelets by anticoagulation protocol: Yes   Plan:  Continue home regimen - give 4 mg x1 today Daily INR, CBC Monitor s/sx of bleeding  Laurey Arrow, PharmD PGY1 Pharmacy Resident 03/30/2021  8:27 AM  Please check [email or web address] for unit-specific pharmacy phone numbers.

## 2021-03-30 NOTE — Progress Notes (Signed)
PROGRESS NOTE    [name]  PYK:[id number] DOB: February 09, 1956 DOA: 03/29/2021 PCP: Antony Contras, MD  Brief Narrative: 65/F with history of atrial fibrillation status post ablation, history of DVT on Coumadin, hypertension, sleep apnea presented to the ED with exertional chest pain X 2 days, patient has had intermittent dizziness this past week, but has not been in A. Fib.  About 10 days ago had a URI, sinusitis, treated with antibiotics and steroids then, cough and congestion has improved. -Yesterday noticed chest pain while doing her laundry, and felt like a pressure, band across her chest, radiating to the back, associated with shortness of breath, went to the urgent care and was sent to the ED. -EKG with no acute ST-T wave changes, high-sensitivity troponin and chest x-ray are negative, vital stable, in sinus rhythm   Assessment & Plan:   Exertional chest pain, ?  Angina -High-sensitivity troponin is negative, EKG without acute ST-T wave changes -Considering her age, and risk factors will request cardiology input for ischemia evaluation  History of atrial fibrillation -Status post ablation, continue verapamil, continue Coumadin  History of DVT in the 70s -Without recurrence,, remains on Coumadin-likely secondary to A. Fib  Hypoglycemia -Check hemoglobin N3Z  Grade 2 diastolic dysfunction -Clinically no evidence of fluid overload at this time  Obesity -BMI 38.9  Obstructive sleep apnea -Continue CPAP nightly  Hypertension -Stable continue losartan and verapamil  History of chronic dizziness -Followed by neurology, will request PT eval pending cardiac work-up    DVT prophylaxis: Coumadin Code Status: Full code Family Communication: Discussed with patient in detail Disposition Plan: Home pending cardiac work-up Status is: Observation    Consultants:  Cardiology   Procedures:   Antimicrobials:    Subjective: -Feels okay this morning, denies any chest pain  today, no shortness of breath reported  Objective: Vitals:   03/30/21 0005 03/30/21 0545 03/30/21 0728 03/30/21 0732  BP: 138/60 (!) 150/66  (!) 154/72  Pulse: 72 (!) 56 66 69  Resp: 15 16 20 19   Temp: 98.6 F (37 C) 98.2 F (36.8 C)  98.7 F (37.1 C)  TempSrc: Oral Oral  Oral  SpO2: 94% 95%  96%  Weight: 102.8 kg     Height:        Intake/Output Summary (Last 24 hours) at 03/30/2021 1029 Last data filed at 03/30/2021 7673 Gross per 24 hour  Intake 1360 ml  Output 1075 ml  Net 285 ml   Filed Weights   03/29/21 1433 03/29/21 2218 03/30/21 0005  Weight: 99.8 kg 102.8 kg 102.8 kg    Examination:  General exam: Obese pleasant female, sitting up in bed, AAOx3, no distress HEENT: JVD CVS: S1-S2, regular rate rhythm Lungs: Clear bilaterally Abdomen: Obese, soft, nontender, bowel sounds present Extremities: No edema Skin: No rash on exposed skin   Data Reviewed:   CBC: Recent Labs  Lab 03/29/21 1522 03/30/21 0434  WBC 10.8* 9.0  HGB 15.0 15.5*  HCT 43.9 46.0  MCV 90.5 91.5  PLT 341 419   Basic Metabolic Panel: Recent Labs  Lab 03/29/21 1522 03/30/21 0434  NA 135 135  K 4.1 4.7  CL 99 99  CO2 26 28  GLUCOSE 172* 153*  BUN 17 18  CREATININE 0.65 0.78  CALCIUM 9.4 9.8   GFR: Estimated Creatinine Clearance: 80.7 mL/min (by C-G formula based on SCr of 0.78 mg/dL). Liver Function Tests: Recent Labs  Lab 03/29/21 1522  AST 25  ALT 31  ALKPHOS 67  BILITOT  0.3  PROT 7.3  ALBUMIN 3.6   Recent Labs  Lab 03/29/21 1522  LIPASE 31   No results for input(s): AMMONIA in the last 168 hours. Coagulation Profile: Recent Labs  Lab 03/29/21 1522 03/30/21 0434  INR 2.4* 2.5*   Cardiac Enzymes: No results for input(s): CKTOTAL, CKMB, CKMBINDEX, TROPONINI in the last 168 hours. BNP (last 3 results) No results for input(s): PROBNP in the last 8760 hours. HbA1C: No results for input(s): HGBA1C in the last 72 hours. CBG: No results for input(s): GLUCAP in  the last 168 hours. Lipid Profile: No results for input(s): CHOL, HDL, LDLCALC, TRIG, CHOLHDL, LDLDIRECT in the last 72 hours. Thyroid Function Tests: No results for input(s): TSH, T4TOTAL, FREET4, T3FREE, THYROIDAB in the last 72 hours. Anemia Panel: No results for input(s): VITAMINB12, FOLATE, FERRITIN, TIBC, IRON, RETICCTPCT in the last 72 hours. Urine analysis:    Component Value Date/Time   COLORURINE YELLOW 12/23/2019 [place] 12/23/2019 0344   LABSPEC 1.012 12/23/2019 0344   PHURINE 5.0 12/23/2019 0344   GLUCOSEU NEGATIVE 12/23/2019 0344   HGBUR SMALL (A) 12/23/2019 0344   BILIRUBINUR NEGATIVE 12/23/2019 0344   KETONESUR NEGATIVE 12/23/2019 0344   PROTEINUR NEGATIVE 12/23/2019 0344   UROBILINOGEN 1.0 11/29/2014 1300   NITRITE NEGATIVE 12/23/2019 0344   LEUKOCYTESUR MODERATE (A) 12/23/2019 0344   Sepsis Labs: @LABRCNTIP (procalcitonin:4,lacticidven:4)  ) Recent Results (from the past 240 hour(s))  Resp Panel by RT-PCR (Flu A&B, Covid) Nasopharyngeal Swab     Status: None   Collection Time: 03/29/21  3:22 PM   Specimen: Nasopharyngeal Swab; Nasopharyngeal(NP) swabs in vial transport medium  Result Value Ref Range Status   SARS Coronavirus 2 by RT PCR NEGATIVE NEGATIVE Final    Comment: (NOTE) SARS-CoV-2 target nucleic acids are NOT DETECTED.  The SARS-CoV-2 RNA is generally detectable in upper respiratory specimens during the acute phase of infection. The lowest concentration of SARS-CoV-2 viral copies this assay can detect is 138 copies/mL. A negative result does not preclude SARS-Cov-2 infection and should not be used as the sole basis for treatment or other patient management decisions. A negative result may occur with  improper specimen collection/handling, submission of specimen other than nasopharyngeal swab, presence of viral mutation(s) within the areas targeted by this assay, and inadequate number of viral copies(<138 copies/mL). A negative  result must be combined with clinical observations, patient history, and epidemiological information. The expected result is Negative.  Fact Sheet for Patients:  EntrepreneurPulse.com.au  Fact Sheet for Healthcare Providers:  IncredibleEmployment.be  This test is no t yet approved or cleared by the [place] FDA and  has been authorized for detection and/or diagnosis of SARS-CoV-2 by FDA under an Emergency Use Authorization (EUA). This EUA will remain  in effect (meaning this test can be used) for the duration of the COVID-19 declaration under Section 564(b)(1) of the Act, 21 U.S.C.section 360bbb-3(b)(1), unless the authorization is terminated  or revoked sooner.       Influenza A by PCR NEGATIVE NEGATIVE Final   Influenza B by PCR NEGATIVE NEGATIVE Final    Comment: (NOTE) The Xpert Xpress SARS-CoV-2/FLU/RSV plus assay is intended as an aid in the diagnosis of influenza from Nasopharyngeal swab specimens and should not be used as a sole basis for treatment. Nasal washings and aspirates are unacceptable for Xpert Xpress SARS-CoV-2/FLU/RSV testing.  Fact Sheet for Patients: EntrepreneurPulse.com.au  Fact Sheet for Healthcare Providers: IncredibleEmployment.be  This test is not yet approved or cleared by the [place]  States FDA and has been authorized for detection and/or diagnosis of SARS-CoV-2 by FDA under an Emergency Use Authorization (EUA). This EUA will remain in effect (meaning this test can be used) for the duration of the COVID-19 declaration under Section 564(b)(1) of the Act, 21 U.S.C. section 360bbb-3(b)(1), unless the authorization is terminated or revoked.  Performed at [facility], [address] [place], [place] [address]          Radiology Studies: DG Chest 2 View  Result Date: 03/29/2021 CLINICAL DATA:  Chest pain EXAM: CHEST - 2 VIEW COMPARISON:  06/03/2020  FINDINGS: Mild cardiomegaly. Both lungs are clear. Disc degenerative disease of the thoracic spine. IMPRESSION: Mild cardiomegaly without acute abnormality of the lungs. Electronically Signed   By: Delanna Ahmadi M.D.   On: 03/29/2021 15:04        Scheduled Meds:  budesonide  0.25 mg Nebulization BID   ezetimibe  10 mg Oral Daily   losartan  25 mg Oral Daily   pantoprazole  40 mg Oral Daily   venlafaxine XR  37.5 mg Oral Q breakfast   verapamil  80 mg Oral Q12H   warfarin  4 mg Oral ONCE-1600   warfarin  6 mg Oral Once   Warfarin - Pharmacist Dosing Inpatient   Does not apply q1600   Continuous Infusions:   LOS: 0 days    Time spent: 74min  Domenic Polite, MD [facility]   03/30/2021, 10:29 AM

## 2021-03-31 ENCOUNTER — Encounter (HOSPITAL_COMMUNITY): Payer: Self-pay

## 2021-04-03 DIAGNOSIS — K219 Gastro-esophageal reflux disease without esophagitis: Secondary | ICD-10-CM | POA: Insufficient documentation

## 2021-04-03 DIAGNOSIS — R42 Dizziness and giddiness: Secondary | ICD-10-CM | POA: Insufficient documentation

## 2021-04-08 ENCOUNTER — Telehealth (HOSPITAL_COMMUNITY): Payer: Self-pay | Admitting: Emergency Medicine

## 2021-04-08 NOTE — Telephone Encounter (Signed)
Reaching out to patient to offer assistance regarding upcoming cardiac imaging study; pt verbalizes understanding of appt date/time, parking situation and where to check in, pre-test NPO status and medications ordered, and verified current allergies; name and call back number provided for further questions should they arise Marchia Bond RN Navigator Cardiac Imaging [facility] [phone] office [phone] cell   Arrival 730a Taking verapamil tonight and tomorrow morning (doubling doses) Understands 13 hr prep to be taken at 6:45pm. 12:45am, and 6:45a Denies iv issues

## 2021-04-09 ENCOUNTER — Ambulatory Visit (HOSPITAL_COMMUNITY)
Admission: RE | Admit: 2021-04-09 | Discharge: 2021-04-09 | Disposition: A | Discharge status: Home / Self Care | Payer: Medicare Other | Source: Ambulatory Visit | Attending: Cardiology | Admitting: Cardiology

## 2021-04-09 ENCOUNTER — Other Ambulatory Visit: Payer: Self-pay

## 2021-04-09 ENCOUNTER — Encounter: Payer: Self-pay | Admitting: Gastroenterology

## 2021-04-09 DIAGNOSIS — I200 Unstable angina: Secondary | ICD-10-CM | POA: Diagnosis present

## 2021-04-09 MED ORDER — METOPROLOL TARTRATE 5 MG/5ML IV SOLN: 5.0000 mg | INTRAVENOUS | Status: DC | PRN

## 2021-04-09 MED ORDER — METOPROLOL TARTRATE 5 MG/5ML IV SOLN
INTRAVENOUS | Status: AC
  Administered 2021-04-09: 5 mg via INTRAVENOUS
  Filled 2021-04-09: qty 15

## 2021-04-09 MED ORDER — NITROGLYCERIN 0.4 MG SL SUBL
0.8000 mg | SUBLINGUAL_TABLET | Freq: Once | SUBLINGUAL | Status: AC
  Administered 2021-04-09: 0.8 mg via SUBLINGUAL

## 2021-04-09 MED ORDER — NITROGLYCERIN 0.4 MG SL SUBL
SUBLINGUAL_TABLET | SUBLINGUAL | Status: AC
  Filled 2021-04-09: qty 2

## 2021-04-09 MED ORDER — IOHEXOL 350 MG/ML SOLN
100.0000 mL | Freq: Once | INTRAVENOUS | Status: AC | PRN
  Administered 2021-04-09: 100 mL via INTRAVENOUS

## 2021-04-09 MED ORDER — DILTIAZEM HCL 25 MG/5ML IV SOLN
INTRAVENOUS | Status: AC
  Filled 2021-04-09: qty 5

## 2021-04-21 ENCOUNTER — Ambulatory Visit (INDEPENDENT_AMBULATORY_CARE_PROVIDER_SITE_OTHER): Payer: Medicare Other

## 2021-04-21 ENCOUNTER — Other Ambulatory Visit: Payer: Self-pay

## 2021-04-21 DIAGNOSIS — I4891 Unspecified atrial fibrillation: Secondary | ICD-10-CM

## 2021-04-21 DIAGNOSIS — I480 Paroxysmal atrial fibrillation: Secondary | ICD-10-CM | POA: Diagnosis not present

## 2021-04-21 DIAGNOSIS — Z5181 Encounter for therapeutic drug level monitoring: Secondary | ICD-10-CM

## 2021-04-21 LAB — POCT INR: INR: 2.7 (ref 2.0–3.0)

## 2021-04-21 NOTE — Patient Instructions (Signed)
Continue taking Warfarin 4mg  daily except 6mg  on Tuesdays, Thursdays, and Saturdays. Recheck INR in 6 weeks. Call [facility] [phone] Main # [phone] with any questions

## 2021-04-25 ENCOUNTER — Other Ambulatory Visit: Payer: Self-pay | Admitting: Gastroenterology

## 2021-04-25 ENCOUNTER — Encounter: Payer: Self-pay | Admitting: Gastroenterology

## 2021-04-25 ENCOUNTER — Ambulatory Visit (INDEPENDENT_AMBULATORY_CARE_PROVIDER_SITE_OTHER): Payer: Medicare Other | Admitting: Gastroenterology

## 2021-04-25 ENCOUNTER — Telehealth: Payer: Self-pay

## 2021-04-25 VITALS — BP 134/72 | Ht 64.0 in | Wt 228.0 lb

## 2021-04-25 DIAGNOSIS — Z79899 Other long term (current) drug therapy: Secondary | ICD-10-CM | POA: Diagnosis not present

## 2021-04-25 DIAGNOSIS — Z86010 Personal history of colonic polyps: Secondary | ICD-10-CM | POA: Diagnosis not present

## 2021-04-25 DIAGNOSIS — K449 Diaphragmatic hernia without obstruction or gangrene: Secondary | ICD-10-CM

## 2021-04-25 DIAGNOSIS — I200 Unstable angina: Secondary | ICD-10-CM

## 2021-04-25 DIAGNOSIS — K219 Gastro-esophageal reflux disease without esophagitis: Secondary | ICD-10-CM | POA: Diagnosis not present

## 2021-04-25 DIAGNOSIS — Z7901 Long term (current) use of anticoagulants: Secondary | ICD-10-CM

## 2021-04-25 MED ORDER — OMEPRAZOLE 40 MG PO CPDR: 40.0000 mg | DELAYED_RELEASE_CAPSULE | Freq: Two times a day (BID) | ORAL | 1 refills | Status: DC

## 2021-04-25 MED ORDER — SUTAB 1479-225-188 MG PO TABS: 1.0000 | ORAL_TABLET | Freq: Once | ORAL | 0 refills | Status: AC

## 2021-04-25 MED ORDER — SUCRALFATE 1 GM/10ML PO SUSP: 1.0000 g | Freq: Three times a day (TID) | ORAL | 1 refills | Status: DC | PRN

## 2021-04-25 NOTE — Telephone Encounter (Signed)
Please advise on holding coumadin prior to colonoscopy. Rout back to [facility] preop pool.   Patient was seen 01/27/21 by Dr. Rayann Heman.

## 2021-04-25 NOTE — Patient Instructions (Addendum)
If you are age 67 or older, your body mass index should be between 23-30. Your Body mass index is 39.14 kg/m. If this is out of the aforementioned range listed, please consider follow up with your Primary Care Provider.  If you are age 55 or younger, your body mass index should be between 19-25. Your Body mass index is 39.14 kg/m. If this is out of the aformentioned range listed, please consider follow up with your Primary Care Provider.   ________________________________________________________  The  GI providers would like to encourage you to use [facility] to communicate with providers for non-urgent requests or questions.  Due to long hold times on the telephone, sending your provider a message by [facility] may be a faster and more efficient way to get a response.  Please allow 48 business hours for a response.  Please remember that this is for non-urgent requests.  _______________________________________________________ Dennis Bast have been scheduled for an endoscopy and colonoscopy. Please follow the written instructions given to you at your visit today. Please pick up your prep supplies at the pharmacy within the next 1-3 days. If you use inhalers (even only as needed), please bring them with you on the day of your procedure.  We have sent the following medications to your pharmacy for you to pick up at your convenience: Omeprazole 40 mg: Take twice daily Carafate suspension: Take 10 ml every 8 hours (Note: Will send tablets with instructions on how to make a slurry if not covered by insurance)  You will be contacted by our office prior to your procedure for directions on holding your Coumadin.  If you do not hear from our office 1 week prior to your scheduled procedure, please call [phone] to discuss.   Please follow up in 3 to 4 months.   Thank you for entrusting me with your care and for choosing [facility], Dr. Lesage Cellar

## 2021-04-25 NOTE — Progress Notes (Signed)
HPI :  66 year old female here for a follow-up visit to discuss surveillance colonoscopy, history of GERD. She was last seen here in November 2022  Recall her last colonoscopy with me was in January 2019.  At that point time she had 5 polyps removed, 3 of them were adenomatous, along with diverticulosis in her left colon and internal hemorrhoids. Largest polyp removed was 1cm in size. We had recommended a repeat colonoscopy in 3 years.  Unfortunately due to recurrent diverticulosis in the left colon she had a sigmoid resection in May 2019, she recovered from that okay.  At this point time she is having 1 bowel movement every day to every other day, bowels working much better the last time I saw her.  Denies any blood in her stool.  Previously was on some MiraLAX for constipation but was a bit too strong for her so she backed off of it.  She denies any family history of colon cancer.  At her last visit we had discussed performing her surveillance colonoscopy but had held off on that as he had been having problems with A-fib with RVR and had a pending cardiology evaluation. She unfortunately was admitted to the hospital on January 7 with chest pain and some dizziness.  She had a cardiology evaluation.  Was not thought to have acute MI, EKG without ST changes, troponin x-ray negative.  She was discharged with outpatient follow-up to have a coronary CT.  This was done on January 18, calcium score of 68, mild nonobstructive coronary artery disease, they did not think cardiac etiology was the cause for her symptoms.  She states generally she has been doing better since her discharge.  Her heart rate she states is typically in the 90s but does occasionally go to the 130s, but this is much less frequent more recently.  She takes verapamil a few times daily to manage this.  She states exertional activity can bring on RVR.  Otherwise she was also seen by ENT for vertigo,, she was told that she probably had BPPV.   At that visit she also endorsed that she has been having globus for the past month or so, stating she frequently needs to clear her throat.  She states her ENT doctor told her she had LPR and that her omeprazole was increased to 40 mg twice daily.  She has been on this dose for the past few weeks and notices about 50% improvement from previous.  She has occasional coughing spells but the globus seems a bit better.  She does have some regurgitation of food into her throat at times, does have nocturnal symptoms and can wake up with some waterbrash as well, not so much pyrosis routinely.  She has had EGD in the past, has 3 cm hiatal hernia, multiple gastric polyps noted which were benign.  She inquires about long-term management of her reflux.   Prior workup: Colonoscopy 04/06/2017 - transverse polyps x 2 cold snare, transverse polyps x 3 cold forceps, sigmoid polyp - path shows 3 adenomas total, diverticulosis in left colon, internal hemorrhoids, recall colonoscopy 3 years   EGD 06/15/2017 -  - A 3 cm hiatal hernia was present. - The exam of the esophagus was otherwise normal. No stenosis / stricture noted. - Two polyps - 5-16m each in close proximity to each other, cummulative 10 mm in size or so, sessile ulcerated / inflamed polyp was found in the cardia. Biopsy was taken at the base / nonulcerated area for  histology. - Multiple 3 to 15 mm sessile polyps were found in the entire examined stomach. Some were erythematous / inflamed. Biopsies of the largest polyp were taken with a cold forceps for histology. - The exam of the stomach was otherwise normal. - The duodenal bulb and second portion of the duodenum were normal.     1. Surgical [P], gastric body polyp - FUNDIC GLAND POLYP. - THERE IS NO EVIDENCE OF MALIGNANCY. 2. Surgical [P], gastric cardia - FUNDIC GLAND POLYP. - THERE IS NO EVIDENCE OF MALIGNANCY.     EGD 07/19/2017 - Esophagogastric landmarks identified. - 3 cm hiatal hernia. -  Normal esophagus - Three gastric polyps in the cardia / hernia sac, ulcerated / inflamed. Injected with epinephrine and then resected and retrieved. 3 hemostasis clips were placed. - Multiple large gastric body polyps. 3 representative samples resected and retrieved. - Normal duodenal bulb and second portion of the duodenum.   1. Stomach, polyp(s), body - FUNDIC GLAND POLYPS. - NO INTESTINAL METAPLASIA, DYSPLASIA, OR MALIGNANCY. 2. Stomach, polyp(s), cardia - HYPERPLASTIC POLYP. - NO INTESTINAL METAPLASIA, DYSPLASIA, OR MALIGNANCY   Echo 03/15/21 - EF 60-65%, grade II DD  Admitted to hospital 03/29/21 for chest pain EGD without acute ischemic change. Negative troponins, xray okay. Discharged per cardiology for plans for outpatient cardiac CT  CT cardiac 04/09/21 - IMPRESSION: 1. Coronary calcium score of 68. This was 61 percentile for age and sex matched control.   2. Normal coronary origin with right dominance.   3. CAD-RADS 2. Mild non-obstructive CAD (25-49%). Consider non-atherosclerotic causes of chest pain. Consider preventive therapy and risk factor modification  IMPRESSION: Stable small hiatal hernia and hepatic steatosis      Past Medical History:  Diagnosis Date   Allergic rhinitis    Arthritis    "back" (05/07/2016)   Asthma    Atrial flutter ([place])    a. Remotely per notes.   Carpal tunnel syndrome, bilateral    Colon polyps    Diverticulitis    Dizziness    DVT (deep venous thrombosis) ([place]) 1980s x2 -    between birth of two sons. Saw hematology - was told she has a hypercoagulable disorder, should be on Coumadin lifelong.   Dysrhythmia    atrial fib   Family history of adverse reaction to anesthesia    "mother gets PONV"   Gallbladder disease    GERD (gastroesophageal reflux disease)    GIB (gastrointestinal bleeding) 04/14/2018   Halothane adverse reaction    narrow small opening   Heart murmur    Hiatal hernia    History of hiatal hernia     "small one/CTA 05/01/2016" (05/07/2016)   HTN (hypertension)    Hypercholesteremia    hx; "brought it down w/diet" (05/07/2016)   Hyperglycemia    a. A1c 6.1 in 2014.   Inguinal hernia    Iron deficiency anemia due to chronic blood loss 04/14/2018   Iron malabsorption 04/14/2018   Left sided sciatica    Normal cardiac stress test 06/2012   Obesity    OSA (obstructive sleep apnea)    a. Mild, did not tolerate CPAP (05/07/2016)   PAF (paroxysmal atrial fibrillation) ([place])    a. s/p afib ablation at [facility] 2010 (Dr. Annabell [place]). b. Recurrent AF s/p DCCV 06/2010. c. On flecainide.    Paroxysmal atrial fibrillation ([place]) 09/09/2009   Qualifier: Diagnosis of  By: Selena Batten CMA, [place]     PONV (postoperative nausea and vomiting)    PPD positive,  treated 1977   "treated for 1 yr after exposure to patient"   Pre-diabetes    RSV infection ~ 2006   Vertigo    Wandering (atrial) pacemaker    a. Remotely per notes.  (  pt. states has a wandering p wave ) no pacemaker     Past Surgical History:  Procedure Laterality Date   ANKLE ARTHODESIS W/ ARTHROSCOPY Right 11/12/2020   ATRIAL ABLATION SURGERY  05/07/2016   "fib and flutter"   ATRIAL FIBRILLATION ABLATION  2010   at [facility] N/A 05/07/2016   Procedure: Atrial Fibrillation Ablation;  Surgeon: Thompson Grayer, MD;  Location: [place] [facility];  Service: Cardiovascular;  Laterality: N/A;   BREAST CYST ASPIRATION Bilateral    CARDIAC CATHETERIZATION  2008   CARPAL TUNNEL RELEASE Bilateral    CATARACT EXTRACTION Bilateral 2022   CESAREAN SECTION  1986; 1988   COLONOSCOPY  X 2   COLONOSCOPY W/ BIOPSIES AND POLYPECTOMY  X 2   CYSTOSCOPY/URETEROSCOPY/HOLMIUM LASER/STENT PLACEMENT Left 12/23/2019   Procedure: CYSTOSCOPY/URETEROSCOPY/HOLMIUM LASER/STENT PLACEMENT;  Surgeon: Robley Fries, MD;  Location: [facility];  Service: Urology;  Laterality: Left;   ESOPHAGOGASTRODUODENOSCOPY     ESOPHAGOGASTRODUODENOSCOPY  (EGD) WITH PROPOFOL N/A 07/19/2017   Procedure: ESOPHAGOGASTRODUODENOSCOPY (EGD) WITH PROPOFOL;  Surgeon: Yetta Flock, MD;  Location: [facility];  Service: Gastroenterology;  Laterality: N/A;   INGUINAL HERNIA REPAIR Right    LAPAROSCOPIC CHOLECYSTECTOMY  1989   POLYPECTOMY N/A 07/19/2017   Procedure: POLYPECTOMY;  Surgeon: Yetta Flock, MD;  Location: [facility];  Service: Gastroenterology;  Laterality: N/A;   PROCTOSCOPY N/A 08/06/2017   Procedure: PROCTOSCOPY;  Surgeon: Michael Boston, MD;  Location: [facility];  Service: General;  Laterality: N/A;   TMJ ARTHROPLASTY     TUBAL LIGATION  1988   Family History  Problem Relation Age of Onset   Hypertension Mother    Stroke Mother    Heart attack Mother        2 previous MIs, 3 stents - CAD first diagnosed 65   Thyroid disease Mother    Hyperlipidemia Father    Colon polyps Father    Lupus Sister    Thyroid disease Sister        x 2   Hyperlipidemia Maternal Grandmother    Heart attack Maternal Grandmother    Diabetes Maternal Grandmother    Hypertension Maternal Grandmother    Hypertension Maternal Grandfather    Stroke Maternal Grandfather        Multiple family members   Stroke Paternal Grandmother    Heart attack Paternal Grandfather        Died at 41 of massive MI   Colon cancer Neg Hx    Stomach cancer Neg Hx    Social History   Tobacco Use   Smoking status: Never   Smokeless tobacco: Never  Vaping Use   Vaping Use: Never used  Substance Use Topics   Alcohol use: Not Currently    Comment: rare   Drug use: No   Current Outpatient Medications  Medication Sig Dispense Refill   acetaminophen (TYLENOL) 650 MG CR tablet Take 1,300 mg by mouth every 8 (eight) hours as needed for pain.      cetirizine (ZYRTEC) 10 MG tablet Take 10 mg by mouth daily.     Cholecalciferol (VITAMIN D3) 1000 UNITS CAPS Take 2,000 Units by mouth daily.     dicyclomine (BENTYL) 10 MG capsule TAKE 1 CAPSULE BY MOUTH  30 MINS  BEFORE MEALS AND AT BEDTIME AS NEEDED FOR PAIN/CRAMPING (Patient taking differently: Take 10 mg by mouth See admin instructions. Take 1 capsule (10 mg) by mouth three times daily before meals and at bedtime as needed for pain/cramping.) 360 capsule 1   ezetimibe (ZETIA) 10 MG tablet Take 1 tablet (10 mg total) by mouth daily. 90 tablet 3   FLOVENT HFA 220 MCG/ACT inhaler Inhale 1 puff into the lungs 2 (two) times daily.  1   fluticasone (FLONASE) 50 MCG/ACT nasal spray Place 1 spray into both nostrils 2 (two) times daily.      losartan (COZAAR) 25 MG tablet Take 1 tablet (25 mg total) by mouth daily. 90 tablet 0   meclizine (ANTIVERT) 25 MG tablet Take 1 tablet (25 mg total) by mouth 3 (three) times daily as needed for dizziness. 30 tablet 0   Multiple Vitamin (MULTIVITAMIN WITH MINERALS) TABS tablet Take 1 tablet by mouth daily.     nitroGLYCERIN (NITROSTAT) 0.4 MG SL tablet Place 1 tablet (0.4 mg total) under the tongue every 5 (five) minutes as needed. 25 tablet 0   polyethylene glycol (MIRALAX) 17 g packet Take 17 g by mouth 2 (two) times daily. Titrate as needed (Patient taking differently: Take 17 g by mouth 2 (two) times daily as needed (constipation).) 14 each 0   PROAIR HFA 108 (90 BASE) MCG/ACT inhaler Inhale 2 puffs into the lungs every 6 (six) hours as needed for wheezing or shortness of breath.      Sodium Sulfate-Mag Sulfate-KCl (SUTAB) [phone] MG TABS Take 1 kit by mouth once for 1 dose. 24 tablet 0   venlafaxine XR (EFFEXOR-XR) 37.5 MG 24 hr capsule Take 37.5 mg by mouth daily with breakfast.      verapamil (CALAN) 80 MG tablet Take 1 tablet (80 mg total) by mouth every morning AND 2 tablets (160 mg total) every evening. (Patient taking differently: Take 1 tablet (80 mg total) by mouth twice daily.) 270 tablet 3   warfarin (COUMADIN) 2 MG tablet TAKE AS DIRECTED BY COUMADIN CLINIC (Patient taking differently: Take 4-6 mg by mouth See admin instructions. Take 3 tablets (6 mg) by  mouth daily on Tuesday, Thursday, and Saturday. Take 2 tablets (4 mg) by mouth daily on Sunday, Monday, Wednesday, and Friday.) 225 tablet 1   omeprazole (PRILOSEC) 40 MG capsule Take 1 capsule (40 mg total) by mouth 2 (two) times daily. 180 capsule 1   sucralfate (CARAFATE) 1 g tablet Take 1 tablet (1 g total) by mouth every 8 (eight) hours as needed. Slowly dissolve 1 tablet in 1 tablespoon of distilled water before ingestion 60 tablet 5   No current facility-administered medications for this visit.   Allergies  Allergen Reactions   Aspirin Other (See Comments)    Makes asthma worse and makes stomach hurt   Banana Swelling and Cough    Inside of the mouth swells   Food Other (See Comments) and Cough    Nuts- mucus membranes inflamed/cough    Ivp Dye [Iodinated Contrast Media] Anaphylaxis    Required epinephrine. Since then, has tolerated with premed.   Latex Other (See Comments) and Cough    Wheezing/cough    Meperidine Hcl Other (See Comments)    Projectile vomiting    Peanut-Containing Drug Products Swelling and Cough    Inside of the mouth swells   Penicillins Anaphylaxis    Has patient had a PCN reaction causing immediate rash, facial/tongue/throat swelling, SOB or lightheadedness with  hypotension:Yes Has patient had a PCN reaction causing severe rash involving mucus membranes or skin necrosis:Yes Has patient had a PCN reaction that required hospitalization:Treated in ER w/epi & breathing treatments Has patient had a PCN reaction occurring within the last 10 years:No If all of the above answers are "NO", then may proceed with Cephalosporin use.    Statins Other (See Comments)    Severe leg pain - has tried most of them   Sulfonamide Derivatives Anaphylaxis   Tape Hives and Dermatitis    No plastic tape!!   Alfuzosin Other (See Comments)    Headache, dizziness   Other Other (See Comments)    Vascepa- upset stomach   Sulfamethoxazole Hives     Review of Systems: All  systems reviewed and negative except where noted in HPI.   Lab Results  Component Value Date   WBC 9.0 03/30/2021   HGB 15.5 (H) 03/30/2021   HCT 46.0 03/30/2021   MCV 91.5 03/30/2021   PLT 318 03/30/2021    Lab Results  Component Value Date   CREATININE 0.78 03/30/2021   BUN 18 03/30/2021   NA 135 03/30/2021   K 4.7 03/30/2021   CL 99 03/30/2021   CO2 28 03/30/2021    Lab Results  Component Value Date   ALT 31 03/29/2021   AST 25 03/29/2021   ALKPHOS 67 03/29/2021   BILITOT 0.3 03/29/2021      Physical Exam: BP 134/72    Ht 5' 4" (1.626 m)    Wt 228 lb (103.4 kg)    BMI 39.14 kg/m  Constitutional: Pleasant,well-developed, female in no acute distress. HEENT: Normocephalic and atraumatic. Conjunctivae are normal. No scleral icterus. Neck supple.  Cardiovascular: Normal rate, regular rhythm.  Pulmonary/chest: Effort normal and breath sounds normal.  Abdominal: Soft, nondistended, nontender. There are no masses palpable.  Extremities: no edema Lymphadenopathy: No cervical adenopathy noted. Neurological: Alert and oriented to person place and time. Skin: Skin is warm and dry. No rashes noted. Psychiatric: Normal mood and affect. Behavior is normal.   ASSESSMENT AND PLAN: 66 year old female here for reassessment of the following:  GERD Hiatal hernia Long-term use of PPI History of colon polyps Anticoagulated  Patient with a history of GERD, recent globus that has been bothering her, seems to be improved with higher dose omeprazole 40 mg twice daily.  She has a hiatal hernia, has responded to PPI, symptoms very suggestive of her reflux.  We discussed about long-term management.  We did discuss long-term risk of chronic PPIs and want to use lowest dose needed to control her symptoms over time, that being said lower dose was not working too well for her.  Would continue twice daily dosing of omeprazole for at least 6 weeks to monitor response and once we gain control  can hopefully back off to lower dosing.  Can provide some liquid Carafate nightly to help with some of her nocturnal symptoms.  I do not think baclofen likely to help much in this situation with her hiatal hernia in regards to alternative reflux regimen.  Hopefully she does okay with medical regimen, I do not think a great surgical candidate to repair her hernia at this point time with her cardiovascular issues.  I do not think EGD will make a difference in management at this time, she has no history of Barrett's etc.  Otherwise she is doing better in regards to her A-fib / cardiac issues. Chest pain resolved. Coronary CT looks okay. Still having some occasional  breakthrough with rapid heart rates at times, she is seeing cardiology in a few weeks, hopefully this can be stabilized in the upcoming few months then can proceed with a colonoscopy in March or April timeframe.  She will need approval to hold Coumadin for 5 days, may need Lovenox bridge.  She understands if she is in A-fib with RVR from bowel prep etc. we will not be able to safely perform her colonoscopy.  Hopefully this remains better controlled and may take extra dosing of her Cardizem around the procedure, defer to her cardiologist, she is seeing them in 2 weeks  Plan: - refill omeprazole 27m BID - discussed long term risks, will do BID dosing for at least 6 weeks - add liquid carafate 10cc qHS and as needed 1 bottle, RF1 - [facility] March / April time frame if okay per cardiology, seeing them in 2 weeks, will need approval to hold coumadin for 5 days - follow up in the office in 3-4 months otherwise  SJolly Mango MD [facility]

## 2021-04-25 NOTE — Telephone Encounter (Signed)
[facility] Pre-operative Risk Assessment     Request for surgical clearance:     Endoscopy Procedure  What type of surgery is being performed?     COLONOSCOPY  When is this surgery scheduled?     06-04-21  What type of clearance is required ?   Pharmacy  Are there any medications that need to be held prior to surgery and how long? COUMADIN 5 DAYS  Practice name and name of physician performing surgery?      [facility] DR Shadeland Cellar  What is your office phone and fax number?      Phone- [phone]  Fax- [phone] [place]  Anesthesia type (None, local, MAC, general) ?       MAC   THANK YOU

## 2021-04-25 NOTE — Telephone Encounter (Signed)
Encounter closed in error.

## 2021-04-28 NOTE — Telephone Encounter (Signed)
Called and LM for patient to call back. To discuss holding Coumadin and needing a Lovenox bridge prior to procedure on 06-04-21.

## 2021-04-28 NOTE — Telephone Encounter (Signed)
° ° °  Patient Name: [name]  DOB: [birth year] MRN: [id number]  Primary Cardiologist: Thompson Grayer, MD  Chart reviewed as part of pre-operative protocol coverage. Patient has an upcoming colonoscopy planned and we were asked to give our recommendations for holding Coumadin. Per Pharmacy and office protocol, patient can hold Coumadin for 5 days prior to procedure. She will require bridging with Lovenox due to history of multiple DVTs and hypercoagulability disorder. She has been bridged for multiple procedures in the past and is followed at our [facility] Coumadin clinic. Our office will help arrange and has left a message for patient to call us back.  I will route this recommendation to the requesting party via Epic fax function and remove from pre-op pool.  Please call with questions.  Darreld Mclean, PA-C 04/28/2021, 5:49 PM

## 2021-04-28 NOTE — Telephone Encounter (Signed)
Patient with diagnosis of afib and multiple DVTs on warfarin for anticoagulation.    Procedure: colonoscopy Date of procedure: 06/04/21  CHA2DS2-VASc Score = 5  This indicates a 7.2% annual risk of stroke. The patient's score is based upon: CHF History: 0 HTN History: 1 Diabetes History: 1 Stroke History: 0 Vascular Disease History: 1 Age Score: 1 Gender Score: 1   CrCl 56mL/min using adjusted body weight Platelet count 318K  Per office protocol, patient can hold warfarin for 5 days prior to procedure. She will require bridging with Lovenox due to hx of multiple DVTs and hypercoagulability disorder. She has been bridged for multiple procedures in the past and is followed at [facility] Coumadin clinic.   Next INR check currently scheduled for 3/13 but procedure is 3/15, INR check will need to be moved up to coordinate bridge. Left message for pt to call Coumadin clinic back.

## 2021-04-29 NOTE — Telephone Encounter (Signed)
Called patient and LM with details and asked her to call back.

## 2021-04-29 NOTE — Telephone Encounter (Signed)
Patient returned your call, stated she made an appointment with Cardio.

## 2021-05-05 ENCOUNTER — Other Ambulatory Visit: Payer: Self-pay | Admitting: Internal Medicine

## 2021-05-07 ENCOUNTER — Encounter: Payer: Self-pay | Admitting: Gastroenterology

## 2021-05-08 NOTE — Progress Notes (Signed)
Cardiology Office Note Date:  05/12/2021  [name] ID:  [name], DOB [birth year], MRN [id number] PCP:  Antony Contras, MD  Electrophysiologist: Dr. Rayann Heman  Chief Complaint:  68mo  History of Present Illness: [name] is a 66 y.o. female with history of RA, HTN, HLD, DVT (recurrent), AFib  She comes in today to be seen for Dr. Rayann Heman, last seen by him Nov 2022, she had been feeling some palpitations, kardia tracings without AFib/arrhythmias Planned for labs and updated echo Also mentioned dizziness, c/w Vertigo  She had a hospital stay 03/29/21 - 03/30/21 for CP, neg trops, nonischemic EKG, Kardia tracings without arrhythmia.  Coronary CT with minimal CAD, ca++ 68  04/25/21 Pre-op pool/coumadin clinic managed pre-colonoscopy, pending final d/w [name]  She is scheduled for a colonoscopy 3/15 Knee surgery 3/28  TODAY She discusses a few symptoms:  Known Vertigo, She has seen her PMD and neurologist has completed therapy with improvement in this spinning sensation, typically provoked by turning her head left. She has also seen ENT who felt her ears were OK though had signs of GERD on her throat and her Prilosec doubled, with improvement in her GERD She has seen GI who continued Kelly same Prilosec and plans for colonoscopy w/hx of polyps and diverticulitis She has had trouble with recurrent sinus infections  Postural lightheadedness, this dates back quite a while, with no real change in Kelly bahvior of thie symptom, she has not had syncope, will sometimes need to sit, put her head down to resolve her lightheadedness.  Palpitations, these seemed to flare up intermittently and keeps track via her Jodelle Red, some time ago she self increased her verapamil back to 160mg  BID and this has settled these pretty well, and did NOT worsen her postural symptom.  CP.  This continues to be a problem. She has no CP at rest or with ADLs, but will get CP with heavier activities like scrubbing Kelly  floor, heavy vacuuming, is it central/left of sternum and radiates to between her shoulder blades, it will resolve with rest and or s/l NTG. She does not percive palpitations or seen Afib with her CP Since home from her Jan hospital stay has used NTG perhaps 6 times.    AFib hx AFib ablation at [facility] 2010, Dr. Annabell Howells PVI and CTI ablation 05/07/2016  AAD Hx Flecainide dose limited with development of BBB > failed to maintain SR and stopped Dec 2017 Multaq stopped post PVI ablation 2018   Past Medical History:  Diagnosis Date   Allergic rhinitis    Arthritis    "back" (05/07/2016)   Asthma    Atrial flutter ([place])    a. Remotely per notes.   Carpal tunnel syndrome, bilateral    Colon polyps    Diverticulitis    Dizziness    DVT (deep venous thrombosis) ([place]) 1980s x2 -    between birth of two sons. Saw hematology - was told she has a hypercoagulable disorder, should be on Coumadin lifelong.   Dysrhythmia    atrial fib   Family history of adverse reaction to anesthesia    "mother gets PONV"   Gallbladder disease    GERD (gastroesophageal reflux disease)    GIB (gastrointestinal bleeding) 04/14/2018   Halothane adverse reaction    narrow small opening   Heart murmur    Hiatal hernia    History of hiatal hernia    "small one/CTA 05/01/2016" (05/07/2016)   HTN (hypertension)    Hypercholesteremia  hx; "brought it down w/diet" (05/07/2016)   Hyperglycemia    a. A1c 6.1 in 2014.   Inguinal hernia    Iron deficiency anemia due to chronic blood loss 04/14/2018   Iron malabsorption 04/14/2018   Left sided sciatica    Normal cardiac stress test 06/2012   Obesity    OSA (obstructive sleep apnea)    a. Mild, did not tolerate CPAP (05/07/2016)   PAF (paroxysmal atrial fibrillation) ([place])    a. s/p afib ablation at [facility] 2010 (Dr. Annabell Howells). b. Recurrent AF s/p DCCV 06/2010. c. On flecainide.    Paroxysmal atrial fibrillation ([place]) 09/09/2009   Qualifier: Diagnosis  of  By: Selena Batten CMA, [place]     PONV (postoperative nausea and vomiting)    PPD positive, treated 1977   "treated for 1 yr after exposure to [name]"   Pre-diabetes    RSV infection ~ 2006   Vertigo    Wandering (atrial) pacemaker    a. Remotely per notes.  (  pt. states has a wandering p wave ) no pacemaker    Past Surgical History:  Procedure Laterality Date   ANKLE ARTHODESIS W/ ARTHROSCOPY Right 11/12/2020   ATRIAL ABLATION SURGERY  05/07/2016   "fib and flutter"   ATRIAL FIBRILLATION ABLATION  2010   at [place] N/A 05/07/2016   Procedure: Atrial Fibrillation Ablation;  Surgeon: Thompson Grayer, MD;  Location: [facility];  Service: Cardiovascular;  Laterality: N/A;   BREAST CYST ASPIRATION Bilateral    CARDIAC CATHETERIZATION  2008   CARPAL TUNNEL RELEASE Bilateral    CATARACT EXTRACTION Bilateral 2022   CESAREAN SECTION  1986; 1988   COLONOSCOPY  X 2   COLONOSCOPY W/ BIOPSIES AND POLYPECTOMY  X 2   CYSTOSCOPY/URETEROSCOPY/HOLMIUM LASER/STENT PLACEMENT Left 12/23/2019   Procedure: CYSTOSCOPY/URETEROSCOPY/HOLMIUM LASER/STENT PLACEMENT;  Surgeon: Robley Fries, MD;  Location: [facility];  Service: Urology;  Laterality: Left;   ESOPHAGOGASTRODUODENOSCOPY     ESOPHAGOGASTRODUODENOSCOPY (EGD) WITH PROPOFOL N/A 07/19/2017   Procedure: ESOPHAGOGASTRODUODENOSCOPY (EGD) WITH PROPOFOL;  Surgeon: Yetta Flock, MD;  Location: [facility];  Service: Gastroenterology;  Laterality: N/A;   INGUINAL HERNIA REPAIR Right    LAPAROSCOPIC CHOLECYSTECTOMY  1989   POLYPECTOMY N/A 07/19/2017   Procedure: POLYPECTOMY;  Surgeon: Yetta Flock, MD;  Location: [facility];  Service: Gastroenterology;  Laterality: N/A;   PROCTOSCOPY N/A 08/06/2017   Procedure: PROCTOSCOPY;  Surgeon: Michael Boston, MD;  Location: [facility];  Service: General;  Laterality: N/A;   TMJ ARTHROPLASTY     TUBAL LIGATION  1988    Current Outpatient Medications   Medication Sig Dispense Refill   acetaminophen (TYLENOL) 650 MG CR tablet Take 1,300 mg by mouth every 8 (eight) hours as needed for pain.      amoxicillin-clavulanate (AUGMENTIN) 875-125 MG tablet Take 1 tablet by mouth every 12 (twelve) hours.     cetirizine (ZYRTEC) 10 MG tablet Take 10 mg by mouth daily.     Cholecalciferol (VITAMIN D3) 1000 UNITS CAPS Take 2,000 Units by mouth daily.     dicyclomine (BENTYL) 10 MG capsule TAKE 1 CAPSULE BY MOUTH 30 MINS BEFORE MEALS AND AT BEDTIME AS NEEDED FOR PAIN/CRAMPING ([name] taking differently: Take 10 mg by mouth See admin instructions. Take 1 capsule (10 mg) by mouth three times daily before meals and at bedtime as needed for pain/cramping.) 360 capsule 1   ezetimibe (ZETIA) 10 MG tablet TAKE 1 TABLET BY MOUTH EVERY DAY 90  tablet 3   FLOVENT HFA 220 MCG/ACT inhaler Inhale 1 puff into Kelly lungs 2 (two) times daily.  1   fluticasone (FLONASE) 50 MCG/ACT nasal spray Place 1 spray into both nostrils 2 (two) times daily.      losartan (COZAAR) 25 MG tablet Take 1 tablet (25 mg total) by mouth daily. 90 tablet 0   meclizine (ANTIVERT) 25 MG tablet Take 1 tablet (25 mg total) by mouth 3 (three) times daily as needed for dizziness. 30 tablet 0   Multiple Vitamin (MULTIVITAMIN WITH MINERALS) TABS tablet Take 1 tablet by mouth daily.     nitroGLYCERIN (NITROSTAT) 0.4 MG SL tablet Place 1 tablet (0.4 mg total) under Kelly tongue every 5 (five) minutes as needed. 25 tablet 0   omeprazole (PRILOSEC) 40 MG capsule Take 1 capsule (40 mg total) by mouth 2 (two) times daily. 180 capsule 1   polyethylene glycol (MIRALAX) 17 g packet Take 17 g by mouth 2 (two) times daily. Titrate as needed ([name] taking differently: Take 17 g by mouth 2 (two) times daily as needed (constipation).) 14 each 0   PROAIR HFA 108 (90 BASE) MCG/ACT inhaler Inhale 2 puffs into Kelly lungs every 6 (six) hours as needed for wheezing or shortness of breath.      sucralfate (CARAFATE) 1 g tablet  Take 1 tablet (1 g total) by mouth every 8 (eight) hours as needed. Slowly dissolve 1 tablet in 1 tablespoon of distilled water before ingestion 60 tablet 5   venlafaxine XR (EFFEXOR-XR) 37.5 MG 24 hr capsule Take 37.5 mg by mouth daily with breakfast.      verapamil (CALAN) 80 MG tablet Take 160 mg by mouth 2 (two) times daily.     warfarin (COUMADIN) 2 MG tablet TAKE AS DIRECTED BY COUMADIN CLINIC ([name] taking differently: Take 4-6 mg by mouth See admin instructions. Take 3 tablets (6 mg) by mouth daily on Tuesday, Thursday, and Saturday. Take 2 tablets (4 mg) by mouth daily on Sunday, Monday, Wednesday, and Friday.) 225 tablet 1   No current facility-administered medications for this visit.    Allergies:   Aspirin, Banana, Food, Iodinated contrast media, Latex, Meperidine hcl, Peanut-containing drug products, Penicillins, Statins, Sulfonamide derivatives, Tape, Alfuzosin, Other, and Sulfamethoxazole   Social History:  [name]  reports that she has never smoked. She has been exposed to tobacco smoke. She has never used smokeless tobacco. She reports that she does not currently use alcohol. She reports that she does not use drugs.   Family History:  [name]'s family history includes Colon polyps in her father; Diabetes in her maternal grandmother; Heart attack in her maternal grandmother, mother, and paternal grandfather; Hyperlipidemia in her father and maternal grandmother; Hypertension in her maternal grandfather, maternal grandmother, and mother; Lupus in her sister; Stroke in her maternal grandfather, mother, and paternal grandmother; Thyroid disease in her mother and sister.  ROS:  Please see Kelly history of present illness.    All other systems are reviewed and otherwise negative.   PHYSICAL EXAM:  VS:  BP 130/72    Pulse 84    Ht 5\' 4"  (1.626 m)    Wt 231 lb 3.2 oz (104.9 kg)    SpO2 96%    BMI 39.69 kg/m  BMI: Body mass index is 39.69 kg/m. Well nourished, well developed, in  no acute distress HEENT: normocephalic, atraumatic Neck: no JVD, carotid bruits or masses Cardiac:  RRR; no significant murmurs, no rubs, or gallops Lungs:  CTA  b/l, no wheezing, rhonchi or rales Abd: soft, nontender MS: no deformity or atrophy Ext: no edema Skin: warm and dry, no rash Neuro:  No gross deficits appreciated Psych: euthymic mood, full affect     EKG:  Done today and reviewed by myself shows  SR 81bpm, no changes  04/09/21: Coronary CT IMPRESSION: 1. Coronary calcium score of 68. This was 57 percentile for age and sex matched control.   2. Normal coronary origin with right dominance.   3. CAD-RADS 2. Mild non-obstructive CAD (25-49%). Consider non-atherosclerotic causes of chest pain. Consider preventive therapy and risk factor modification.   03/14/21: TTE  1. Left ventricular ejection fraction, by estimation, is 60 to 65%. Kelly  left ventricle has normal function. Kelly left ventricle has no regional  wall motion abnormalities. Left ventricular diastolic parameters are  consistent with Grade II diastolic  dysfunction (pseudonormalization). Elevated left ventricular end-diastolic  pressure.   2. Right ventricular systolic function is normal. Kelly right ventricular  size is normal.   3. Left atrial size was mild to moderately dilated.   4. Kelly mitral valve is grossly normal. Mild mitral valve regurgitation.  No evidence of mitral stenosis.   5. Kelly aortic valve is grossly normal. Aortic valve regurgitation is not  visualized. No aortic stenosis is present.   6. Kelly inferior vena cava is normal in size with <50% respiratory  variability, suggesting right atrial pressure of 8 mmHg.   Comparison(s): No significant change from prior study.   05/07/2016; EPS/ablation CONCLUSIONS: 1. Sinus rhythm upon presentation.   2. Intracardiac echo reveals a moderate sized left atrium with four separate pulmonary veins without evidence of pulmonary vein stenosis. 3. Ep  study demonstrated electrical activity within Kelly right superior and inferior pulmonary veins at baseline.  There was exit conduction through Kelly left inferior pulmonary vein.  Kelly left superior pulmonary vein was quiescent from Kelly prior ablation procedure.  4. Successful sequential electrical isolation and anatomical encircling of Kelly left inferior, right superior and right inferior pulmonary veins.  A WACA approach was used along Kelly right PVs.  Kelly left inferior pulmonary vein was isolated along Kelly anterior and inferior quadrant.    Extensive ablation was required along Kelly carina between Kelly RSPV and RIPV. 5. Cavo-tricuspid isthmus ablation was performed with complete bidirectional isthmus block achieved.  6. No inducible arrhythmias following ablation both on and off of Isuprel 7. No early apparent complications.  Recent Labs: 01/27/2021: TSH 1.170 03/29/2021: ALT 31; B Natriuretic Peptide 26.1 03/30/2021: BUN 18; Creatinine, Ser 0.78; Hemoglobin 15.5; Magnesium 2.1; Platelets 318; Potassium 4.7; Sodium 135  No results found for requested labs within last 8760 hours.   CrCl cannot be calculated ([name]'s most recent lab result is older than Kelly maximum 21 days allowed.).   Wt Readings from Last 3 Encounters:  05/12/21 231 lb 3.2 oz (104.9 kg)  04/25/21 228 lb (103.4 kg)  03/30/21 226 lb 9.6 oz (102.8 kg)     Other studies reviewed: Additional studies/records reviewed today include: summarized above  ASSESSMENT AND PLAN:  Paroxysmal Afib CHA2DS2Vasc is 5, on warfarin, monitored and managed with [place] minimal burden by symptoms/Kardia, of late Will continue her verapamil She would like to switch to another EP with Dr. Jackalyn Lombard slow down/retirement   HTN Looks ok, room for nitrate  3. Postural lightheadedness Chronic and unchanged No syncope  4. CP CTa with no obstructive CAD though exertional angina, not unstable In d/w DOD, Dr. Caryl Comes, start Imdur 30mg  daily,  Add statin if  able may benefit from gen cards   She reports severe muscular pain with statins in Kelly pat. We discussed addition of nitrate may worsen her postural lightheadedness and to be very mindful of standing/symptoms.  She understands  Will have her back in a couple weeks to re-evaluate her CP prior to her planned procedure/surgery. She will see cardiology APP, and then perhaps get her with dr. Harrell Gave who saw her in [facility] hospital.  Disposition: Will refer her to Dr. Quentin Ore for new EP MD, 42mo, sooner if needed  Current medicines are reviewed at length with [name] today.  [name] did not have any concerns regarding medicines.  Venetia Night, PA-C 05/12/2021 10:55 AM     [facility] [place] [address] [phone] (office)  [phone] (fax)

## 2021-05-12 ENCOUNTER — Ambulatory Visit: Payer: Medicare Other | Admitting: Internal Medicine

## 2021-05-12 ENCOUNTER — Other Ambulatory Visit: Payer: Self-pay

## 2021-05-12 ENCOUNTER — Ambulatory Visit (INDEPENDENT_AMBULATORY_CARE_PROVIDER_SITE_OTHER): Payer: Medicare Other | Admitting: Physician Assistant

## 2021-05-12 ENCOUNTER — Encounter: Payer: Self-pay | Admitting: Physician Assistant

## 2021-05-12 VITALS — BP 130/72 | HR 84 | Ht 64.0 in | Wt 231.2 lb

## 2021-05-12 DIAGNOSIS — I10 Essential (primary) hypertension: Secondary | ICD-10-CM | POA: Diagnosis not present

## 2021-05-12 DIAGNOSIS — I200 Unstable angina: Secondary | ICD-10-CM | POA: Diagnosis not present

## 2021-05-12 DIAGNOSIS — I480 Paroxysmal atrial fibrillation: Secondary | ICD-10-CM | POA: Diagnosis not present

## 2021-05-12 DIAGNOSIS — R079 Chest pain, unspecified: Secondary | ICD-10-CM

## 2021-05-12 DIAGNOSIS — R42 Dizziness and giddiness: Secondary | ICD-10-CM

## 2021-05-12 DIAGNOSIS — J302 Other seasonal allergic rhinitis: Secondary | ICD-10-CM | POA: Insufficient documentation

## 2021-05-12 MED ORDER — ISOSORBIDE MONONITRATE ER 30 MG PO TB24: 30.0000 mg | ORAL_TABLET | Freq: Every day | ORAL | 1 refills | Status: DC

## 2021-05-12 NOTE — Patient Instructions (Addendum)
Medication Instructions:    START TAKING IMDUR 30 MG ONCE A DAY      *If you need a refill on your cardiac medications before your next appointment, please call your pharmacy*   Lab Work: NONE ORDERED  TODAY   If you have labs (blood work) drawn today and your tests are completely normal, you will receive your results only by: [place] (if you have MyChart) OR A paper copy in the mail If you have any lab test that is abnormal or we need to change your treatment, we will call you to review the results.   Testing/Procedures: NONE ORDERED  TODAY   Follow-Up: At [facility], you and your health needs are our priority.  As part of our continuing mission to provide you with exceptional heart care, we have created designated Provider Care Teams.  These Care Teams include your primary Cardiologist (physician) and Advanced Practice Providers (APPs -  Physician Assistants and Nurse Practitioners) who all work together to provide you with the care you need, when you need it.  We recommend signing up for the patient portal called "MyChart".  Sign up information is provided on this After Visit Summary.  MyChart is used to connect with patients for Virtual Visits (Telemedicine).  Patients are able to view lab/test results, encounter notes, upcoming appointments, etc.  Non-urgent messages can be sent to your provider as well.   To learn more about what you can do with MyChart, go to [email or web address].    Your next appointment:   4 month(s)  The format for your next appointment:   In Person  Provider:   Lars Mage, MD{  Other Instructions

## 2021-05-13 ENCOUNTER — Other Ambulatory Visit: Payer: Self-pay | Admitting: Orthopaedic Surgery

## 2021-05-14 ENCOUNTER — Telehealth: Payer: Self-pay | Admitting: *Deleted

## 2021-05-14 NOTE — Telephone Encounter (Signed)
Patient with diagnosis of recurrent DVT/afib on warfarin for anticoagulation.    Per chart she has 2 DVT between the birth of her 2 sons- per pt report she saw hematology who told her she has a hypercoagulable disorder and needed to be on warfarin lifelong.  Procedure:  RIGHT KNEE ARTHROSCOPY Date of procedure: 06/10/21  CHA2DS2-VASc Score = 5   This indicates a 7.2% annual risk of stroke. The patient's score is based upon: CHF History: 0 HTN History: 1 Diabetes History: 1 Stroke History: 0 Vascular Disease History: 1 Age Score: 1 Gender Score: 1      CrCl 83 ml/min Platelet count 318  Per office protocol, patient can hold warfarin for 5 days prior to procedure.    Patient WILL need bridging with Lovenox (enoxaparin) around procedure.  For orthopedic procedures please be sure to resume therapeutic (not prophylactic) dosing.  Patient is having a colonoscopy on 3/15 and then knee surgery on 3/21. Coumadin clinic will bridge her for the colonoscopy at her apt on 3/7. After her colonoscopy, she will resume lovenox 100mg  BID, but not warfarin. She will continue lovenox until her last dose in the AM of 3/20. On 3/22 she will resume both warfarin and lovenox.

## 2021-05-14 NOTE — Telephone Encounter (Signed)
° °  Pre-operative Risk Assessment    Patient Name: [name]  DOB: [birth year] MRN: [id number]      Request for Surgical Clearance    Procedure:   RIGHT KNEE ARTHROSCOPY  Date of Surgery:  Clearance 06/10/21                                 Surgeon:  DR. Collier Salina DALLDORF Surgeon's Group or Practice Name:  [facility] Phone number:  [phone] Fax number:  [phone] ATTN REBECCA LANG   Type of Clearance Requested:   - Medical  - Pharmacy:  Hold Warfarin (Coumadin)     Type of Anesthesia:   CHOICE   Additional requests/questions:    Jiles Prows   05/14/2021, 9:59 AM

## 2021-05-14 NOTE — Telephone Encounter (Signed)
° °  Patient Name: [name]  DOB: [birth year] MRN: [id number]  Primary Cardiologist: Thompson Grayer, MD  Chart reviewed as part of pre-operative protocol coverage.  Patient was recently seen by Tommye Standard PA-C in follow-up 05/12/21, see her detailed note. At that visit she was complaining of intermittent chest pain with heavier levels of exertion. Cor CT 04/09/21 had shown mild nonobstructive CAD. Imdur 30mg  daily was added per her discussion with Dr. Caryl Comes. Per Renee's recommendation, "Will have her back in a couple weeks to re-evaluate her CP prior to her planned procedure/surgery. She will see cardiology APP, and then perhaps get her with dr. Harrell Gave who saw her in the hospital." Her next OV is scheduled 05/27/21 with Richardson Dopp PA-C at which time this clearance can be addressed along with follow-up of her chest pain. I will route to pharm team for input on warfarin so that this is available to [place] at time of upcoming OV.  I added preop FYI to appointment notes so that provider is aware to address at time of OV. Will route as FYI to requesting provider. Will remove from preop box as separate preop APP input not necessary at this time.  Charlie Pitter, PA-C 05/14/2021, 10:33 AM

## 2021-05-14 NOTE — Telephone Encounter (Addendum)
Anticoagulation recs are now available for review at upcoming OV (Final clearance decision will not be available until provider sees patient 3/7.)

## 2021-05-21 NOTE — Progress Notes (Signed)
? ? Your procedure is scheduled on:  ?  06/10/21  ? Report to [facility] Main  Entrance ? ? Report to admitting at   1245 pm             ?DO NOT BRING INSURANCE CARD,pmPICTURE ID OR WALLET DAY OF SURGERY.  ?  ? ? Call this number if you have problems the morning of surgery [phone]  ? ? REMEMBER: NO  SOLID FOODS , CANDY, GUM OR MINTS AFTER MIDNITE THE NITE BEFORE SURGERY .       [place] Kitchen CLEAR LIQUIDS UNTIL    1200 noon             DAY OF SURGERY.      PLEASE FINISH ENSURE DRINK PER SURGEON ORDER  WHICH NEEDS TO BE COMPLETED AT      1200 noon      MORNING OF SURGERY.   ? ? ? ? ?CLEAR LIQUID DIET ? ? ?Foods Allowed      ?WATER ?BLACK COFFEE ( SUGAR OK, NO MILK, CREAM OR CREAMER) REGULAR AND DECAF  ?TEA ( SUGAR OK NO MILK, CREAM, OR CREAMER) REGULAR AND DECAF  ?PLAIN JELLO ( NO RED)  ?FRUIT ICES ( NO RED, NO FRUIT PULP)  ?POPSICLES ( NO RED)  ?JUICE- APPLE, WHITE GRAPE AND WHITE CRANBERRY  ?SPORT DRINK LIKE GATORADE ( NO RED)  ?CLEAR BROTH ( VEGETABLE , CHICKEN OR BEEF)                                                               ? ?    ? ?BRUSH YOUR TEETH MORNING OF SURGERY AND RINSE YOUR MOUTH OUT, NO CHEWING GUM CANDY OR MINTS. ?  ? ? Take these medicines the morning of surgery with A SIP OF WATER:  zyrtec, flonase, imdur, omeprazole, verapamil, effexor  ? ? ?DO NOT TAKE ANY DIABETIC MEDICATIONS DAY OF YOUR SURGERY ?                  ?            You may not have any metal on your body including hair pins and  ?            piercings  Do not wear jewelry, make-up, lotions, powders or perfumes, deodorant ?            Do not wear nail polish on your fingernails.   ?           IF YOU ARE A FEMALE AND WANT TO SHAVE UNDER ARMS OR LEGS PRIOR TO SURGERY YOU MUST DO SO AT LEAST 48 HOURS PRIOR TO SURGERY.  ?            Men may shave face and neck. ? ? Do not bring valuables to the hospital. [facility] NOT ?            RESPONSIBLE   FOR VALUABLES. ? Contacts, dentures or bridgework may not be worn into  surgery. ? Leave suitcase in the car. After surgery it may be brought to your room. ? ?  ? Patients discharged the day of surgery will not be allowed to drive home. IF YOU ARE HAVING SURGERY AND GOING HOME THE SAME DAY, YOU MUST  HAVE AN ADULT TO DRIVE YOU HOME AND BE WITH YOU FOR 24 HOURS. YOU MAY GO HOME BY TAXI OR UBER OR ORTHERWISE, BUT AN ADULT MUST ACCOMPANY YOU HOME AND STAY WITH YOU FOR 24 HOURS. ?  ? ?            Please read over the following fact sheets you were given: ?_____________________________________________________________________ ? ?[facility] - Preparing for Surgery ?Before surgery, you can play an important role.  Because skin is not sterile, your skin needs to be as free of germs as possible.  You can reduce the number of germs on your skin by washing with CHG (chlorahexidine gluconate) soap before surgery.  CHG is an antiseptic cleaner which kills germs and bonds with the skin to continue killing germs even after washing. ?Please DO NOT use if you have an allergy to CHG or antibacterial soaps.  If your skin becomes reddened/irritated stop using the CHG and inform your nurse when you arrive at Short Stay. ?Do not shave (including legs and underarms) for at least 48 hours prior to the first CHG shower.  You may shave your face/neck. ?Please follow these instructions carefully: ? 1.  Shower with CHG Soap the night before surgery and the  morning of Surgery. ? 2.  If you choose to wash your hair, wash your hair first as usual with your  normal  shampoo. ? 3.  After you shampoo, rinse your hair and body thoroughly to remove the  shampoo.                           4.  Use CHG as you would any other liquid soap.  You can apply chg directly  to the skin and wash  ?                     Gently with a scrungie or clean washcloth. ? 5.  Apply the CHG Soap to your body ONLY FROM THE NECK DOWN.   Do not use on face/ open      ?                     Wound or open sores. Avoid contact with eyes, ears mouth  and genitals (private parts).  ?                     Production manager,  Genitals (private parts) with your normal soap. ?            6.  Wash thoroughly, paying special attention to the area where your surgery  will be performed. ? 7.  Thoroughly rinse your body with warm water from the neck down. ? 8.  DO NOT shower/wash with your normal soap after using and rinsing off  the CHG Soap. ?               9.  Pat yourself dry with a clean towel. ?           10.  Wear clean pajamas. ?           11.  Place clean sheets on your bed the night of your first shower and do not  sleep with pets. ?Day of Surgery : ?Do not apply any lotions/deodorants the morning of surgery.  Please wear clean clothes to the hospital/surgery center. ? ?FAILURE TO FOLLOW THESE INSTRUCTIONS MAY RESULT IN THE CANCELLATION OF YOUR SURGERY ?  PATIENT SIGNATURE_________________________________ ? ?NURSE SIGNATURE__________________________________ ? ?________________________________________________________________________  ? ? ?           ?

## 2021-05-21 NOTE — Progress Notes (Addendum)
Anesthesia Review: ? ?PCP: Dr Antony Contras  ?Cardiologist : DR Thompson Grayer ( pt states at preop she has not been assigned new cardiologist yest)  cardiac clearance- Dayna dunn,PAc 05/14/21- telephone encounter  ?To see Richardson Dopp, PA on 05/27/21 at 230pm and then Coumadin Clinic on 05/27/21 for preop instructions for Lovenox bridge and Coumadin instructions related to colonoscopy on 06/04/21 and knee scope on 06/10/21. ?2018- afib ablation    ?Chest x-ray : 03/29/21- 2V  ?EKG : 05/12/21  ?04/09/21- Ct cors  ?Echo : 03/15/21  ?Stress test: 2017  ?Cardiac Cath :  ?Activity level: can do a flgiht of stairs without diffculty  ?Sleep Study/ CPAP : mild no cpap  ?Fasting Blood Sugar :      / Checks Blood Sugar -- times a day:   ?Blood Thinner/ Instructions /Last Dose: ?ASA / Instructions/ Last Dose :   ?Coumadin with Lovenox Bridge  ?To see Coumadin Clinic on 05/27/21 at 245 pm for instructions regarding Coumadin and Lovenox Bridge related to colonoscopy on 06/04/21 and knee scope on 06/10/21.  PT already aware she is to stop Coumadin 5 days prior to colonoscopy.  And she will be on lovenox post colonoscopy.Again to receive detailed instructis on 05/27/21.   ?2018 - afib ablation  ?No covid test- ambulatory surgery  ?Prediabetes- does not check glucose at home  ?Hgba1c-05/26/21- 6.4  ?PT with hx of TMJ surgery.  States has Small airway.   ?Halothane listed as adverse reactions under anesthesia complications.  At preop of 05/26/21 pt does not recall what reaction was if any.  Preop nurse also placed in allergies.   ?

## 2021-05-22 ENCOUNTER — Other Ambulatory Visit: Payer: Self-pay | Admitting: Cardiology

## 2021-05-26 ENCOUNTER — Encounter (HOSPITAL_COMMUNITY)
Admission: RE | Admit: 2021-05-26 | Discharge: 2021-05-26 | Disposition: A | Discharge status: Home / Self Care | Payer: Medicare Other | Source: Ambulatory Visit | Attending: Orthopaedic Surgery | Admitting: Orthopaedic Surgery

## 2021-05-26 ENCOUNTER — Encounter (HOSPITAL_COMMUNITY): Payer: Self-pay

## 2021-05-26 ENCOUNTER — Encounter: Payer: Self-pay | Admitting: Family

## 2021-05-26 ENCOUNTER — Other Ambulatory Visit: Payer: Self-pay

## 2021-05-26 VITALS — BP 155/89 | HR 83 | Temp 98.8°F | Resp 16 | Ht 64.0 in | Wt 228.0 lb

## 2021-05-26 DIAGNOSIS — R7303 Prediabetes: Secondary | ICD-10-CM | POA: Diagnosis not present

## 2021-05-26 DIAGNOSIS — X58XXXA Exposure to other specified factors, initial encounter: Secondary | ICD-10-CM | POA: Insufficient documentation

## 2021-05-26 DIAGNOSIS — Z86718 Personal history of other venous thrombosis and embolism: Secondary | ICD-10-CM | POA: Diagnosis not present

## 2021-05-26 DIAGNOSIS — I4892 Unspecified atrial flutter: Secondary | ICD-10-CM | POA: Insufficient documentation

## 2021-05-26 DIAGNOSIS — S83241A Other tear of medial meniscus, current injury, right knee, initial encounter: Secondary | ICD-10-CM | POA: Insufficient documentation

## 2021-05-26 DIAGNOSIS — G4733 Obstructive sleep apnea (adult) (pediatric): Secondary | ICD-10-CM | POA: Diagnosis not present

## 2021-05-26 DIAGNOSIS — Z7901 Long term (current) use of anticoagulants: Secondary | ICD-10-CM | POA: Diagnosis not present

## 2021-05-26 DIAGNOSIS — Z01812 Encounter for preprocedural laboratory examination: Secondary | ICD-10-CM | POA: Diagnosis not present

## 2021-05-26 DIAGNOSIS — K219 Gastro-esophageal reflux disease without esophagitis: Secondary | ICD-10-CM | POA: Diagnosis not present

## 2021-05-26 DIAGNOSIS — Z01818 Encounter for other preprocedural examination: Secondary | ICD-10-CM

## 2021-05-26 DIAGNOSIS — I480 Paroxysmal atrial fibrillation: Secondary | ICD-10-CM | POA: Insufficient documentation

## 2021-05-26 HISTORY — DX: Personal history of urinary calculi: Z87.442

## 2021-05-26 HISTORY — DX: Angina pectoris, unspecified: I20.9

## 2021-05-26 LAB — BASIC METABOLIC PANEL
Anion gap: 7 (ref 5–15)
BUN: 12 mg/dL (ref 8–23)
CO2: 27 mmol/L (ref 22–32)
Calcium: 9.5 mg/dL (ref 8.9–10.3)
Chloride: 103 mmol/L (ref 98–111)
Creatinine, Ser: 0.6 mg/dL (ref 0.44–1.00)
GFR, Estimated: >60 mL/min (ref >60)
Glucose, Bld: 154 mg/dL — ABNORMAL HIGH (ref 70–99)
Potassium: 3.9 mmol/L (ref 3.5–5.1)
Sodium: 137 mmol/L (ref 135–145)

## 2021-05-26 LAB — HEMOGLOBIN A1C
Hgb A1c MFr Bld: 6.4 % — ABNORMAL HIGH (ref 4.8–5.6)
Mean Plasma Glucose: 136.98 mg/dL

## 2021-05-26 LAB — CBC
HCT: 42.3 % (ref 36.0–46.0)
Hemoglobin: 13.8 g/dL (ref 12.0–15.0)
MCH: 30.3 pg (ref 26.0–34.0)
MCHC: 32.6 g/dL (ref 30.0–36.0)
MCV: 93 fL (ref 80.0–100.0)
Platelets: 368 10*3/uL (ref 150–400)
RBC: 4.55 MIL/uL (ref 3.87–5.11)
RDW: 13.2 % (ref 11.5–15.5)
WBC: 7.5 10*3/uL (ref 4.0–10.5)
nRBC: 0 % (ref 0.0–0.2)

## 2021-05-26 LAB — GLUCOSE, CAPILLARY: Glucose-Capillary: 152 mg/dL — ABNORMAL HIGH (ref 70–99)

## 2021-05-27 ENCOUNTER — Telehealth: Payer: Self-pay

## 2021-05-27 ENCOUNTER — Ambulatory Visit (INDEPENDENT_AMBULATORY_CARE_PROVIDER_SITE_OTHER): Payer: Medicare Other | Admitting: *Deleted

## 2021-05-27 ENCOUNTER — Ambulatory Visit (INDEPENDENT_AMBULATORY_CARE_PROVIDER_SITE_OTHER): Payer: Medicare Other | Admitting: Physician Assistant

## 2021-05-27 ENCOUNTER — Encounter: Payer: Self-pay | Admitting: Physician Assistant

## 2021-05-27 VITALS — BP 130/68 | HR 77 | Ht 64.0 in | Wt 231.6 lb

## 2021-05-27 DIAGNOSIS — I4891 Unspecified atrial fibrillation: Secondary | ICD-10-CM

## 2021-05-27 DIAGNOSIS — Z5181 Encounter for therapeutic drug level monitoring: Secondary | ICD-10-CM

## 2021-05-27 DIAGNOSIS — Z01810 Encounter for preprocedural cardiovascular examination: Secondary | ICD-10-CM

## 2021-05-27 DIAGNOSIS — E782 Mixed hyperlipidemia: Secondary | ICD-10-CM

## 2021-05-27 DIAGNOSIS — I10 Essential (primary) hypertension: Secondary | ICD-10-CM

## 2021-05-27 DIAGNOSIS — I209 Angina pectoris, unspecified: Secondary | ICD-10-CM

## 2021-05-27 DIAGNOSIS — I200 Unstable angina: Secondary | ICD-10-CM

## 2021-05-27 DIAGNOSIS — D6869 Other thrombophilia: Secondary | ICD-10-CM

## 2021-05-27 DIAGNOSIS — I480 Paroxysmal atrial fibrillation: Secondary | ICD-10-CM

## 2021-05-27 LAB — POCT INR: INR: 2.4 (ref 2.0–3.0)

## 2021-05-27 MED ORDER — ENOXAPARIN SODIUM 100 MG/ML IJ SOSY: 100.0000 mg | PREFILLED_SYRINGE | Freq: Two times a day (BID) | INTRAMUSCULAR | 0 refills | Status: DC

## 2021-05-27 NOTE — Assessment & Plan Note (Signed)
Recent CCTA with mild nonobstructive disease and calcium score 68.  She continued to have exertional anginal symptoms relieved by nitroglycerin.  At last visit, she was placed on long-acting nitrates with isosorbide mononitrate.  Since that time, she has had no further chest symptoms.  Given the results of her CCTA and ongoing symptoms, I suspect that she has microvascular ischemia.  We discussed the pathophysiology today.  Recommend continuing isosorbide mononitrate 30 mg daily.  She has been intolerant to statins in the past.  Continue ezetimibe 10 mg daily.  LDL in January was 125 according to [facility].  If her LDL does remain above 70, consider referral to the Pharm.D. clinic for initiation of PCSK9 inhibitor therapy.  Follow-up with Dr. Harrell Gave in 6 months. ?

## 2021-05-27 NOTE — Assessment & Plan Note (Signed)
Continue warfarin therapy.  This is managed with our Coumadin clinic.  She has a history of recurrent DVT and requires bridging Lovenox therapy while off of Coumadin for her upcoming procedures. ?

## 2021-05-27 NOTE — Telephone Encounter (Signed)
Notes faxed to surgeons. ?Richardson Dopp, PA-C    ?05/27/2021 5:07 PM   ?

## 2021-05-27 NOTE — Assessment & Plan Note (Signed)
She is maintaining sinus rhythm.  She is no longer on antiarrhythmic drug therapy.  She underwent PVI ablation in 2010 at [facility] and again in 2018 with Dr. Rayann Heman here.  Continue verapamil 160 mg twice daily.  Follow-up with EP as planned. ?

## 2021-05-27 NOTE — Telephone Encounter (Signed)
Called and LM for patient that she has been cleared by Dr. Kathlen Mody today to hold Coumadin for 5 days prior to her procedure next week.  She needs to start holding on Friday, 3-10 until procedure with Dr. Havery Moros. Cardiology will arrange for her Lovenox bridge. Requested that patient call back to confirm understanding ?

## 2021-05-27 NOTE — Patient Instructions (Signed)
Medication Instructions:  ? ?Your physician recommends that you continue on your current medications as directed. Please refer to the Current Medication list given to you today. ? ? ?*If you need a refill on your cardiac medications before your next appointment, please call your pharmacy* ? ? ?Follow-Up: ?At [facility], you and your health needs are our priority.  As part of our continuing mission to provide you with exceptional heart care, we have created designated Provider Care Teams.  These Care Teams include your primary Cardiologist (physician) and Advanced Practice Providers (APPs -  Physician Assistants and Nurse Practitioners) who all work together to provide you with the care you need, when you need it. ? ?We recommend signing up for the patient portal called "MyChart".  Sign up information is provided on this After Visit Summary.  MyChart is used to connect with patients for Virtual Visits (Telemedicine).  Patients are able to view lab/test results, encounter notes, upcoming appointments, etc.  Non-urgent messages can be sent to your provider as well.   ?To learn more about what you can do with MyChart, go to [email or web address].   ? ?Your next appointment:   ?6 month(s) ? ?The format for your next appointment:   ?In Person ? ?Provider:   ?Buford Dresser, MD  ? ? ?Other Instructions ? ?Your physician wants you to follow-up in: 6 month with Dr. Harrell Gave.  You will receive a reminder letter in the mail two months in advance. If you don't receive a letter, please call our office to schedule the follow-up appointment. ?  ?

## 2021-05-27 NOTE — Assessment & Plan Note (Addendum)
[name]'s perioperative risk of a major cardiac event is 0.9% according to the Revised Cardiac Risk Index (RCRI).  Therefore, she is at low risk for perioperative complications.   Her functional capacity is good at 4.73 METs according to the Duke Activity Status Index (DASI). ?Recommendations: ?According to ACC/AHA guidelines, no further cardiovascular testing needed.  The patient may proceed to surgery at acceptable risk.   ?Antiplatelet and/or Anticoagulation Recommendations: ?Coumadin can by held for 5 days prior to surgery.  Please resume post op when felt to be safe. ?The patient will require Lovenox bridging while off of Coumadin.  She will see our Coumadin clinic today to arrange this. ? ?

## 2021-05-27 NOTE — Assessment & Plan Note (Signed)
Blood pressure is well controlled.  Continue isosorbide 30 mg daily, losartan 25 mg daily, verapamil 160 mg twice daily. ?

## 2021-05-27 NOTE — Progress Notes (Signed)
Cardiology Office Note:    Date:  05/27/2021   ID:  [name], DOB [birth year], MRN [id number]  PCP:  Antony Contras, MD  [facility] Providers Cardiologist:  Buford Dresser, MD Electrophysiologist:  Thompson Grayer, MD     Referring MD: Antony Contras, MD   Chief Complaint:  F/u on chest pain; surgical clearance    Patient Profile: Paroxysmal Atrial fibrillation  Typical appearing atrial flutter S/p PVI ablation at [facility] in 2010 (Dr. Annabell Howells) S/p PVI and CTI ablation in 04/2016  Failed flecainide Rx (developed BBB as well); Dronedarone DCd after ablation in 2018 Warfarin Rx Coronary artery Ca2+ CCTA 03/2021: CAC score 68, mild non-obs CAD Hypertension  Hyperlipidemia  Recurrent DVT Rheumatoid arthritis  BPPV GERD OSA Pre-diabetes   Prior CV Studies: Coronary CTA 04/09/2021 CAC score = 68 (75th percentile) Mild nonobstructive CAD (LAD proximal 25-49) Small hiatal hernia, hepatic steatosis  Echocardiogram 03/14/2021 EF 60-65, no RWMA, GRII DD, normal RVSF, mild-moderate LAE, mild MR  Myoview 05/14/2015 EF 67, breast attenuation, no ischemia; low risk    History of Present Illness:   [name] is a 66 y.o. female with the above problem list.  She was last seen in 2/23 by Tommye Standard, PA-C for EP f/u.  The patient was admitted in Jan 2023 with chest pain.  She had no significant CAD on CCTA.  She has an upcoming colonoscopy and knee surgery.  When she saw Renee, she was still having with exertion relieved by rest or NTG.  She was started on Isosorbide with plans for f/u today.  Of note, she has a hx of intol to statins.  She returns for f/u.  She is here alone.  She has had no further chest pain since starting on isosorbide.  She has not had significant shortness of breath, syncope, leg edema.  She sleeps on an incline chronically.    Past Medical History:  Diagnosis Date   Allergic rhinitis    Anginal pain ([place])    Arthritis    "back"  (05/07/2016)   Asthma    Atrial flutter ([place])    a. Remotely per notes.   Carpal tunnel syndrome, bilateral    Colon polyps    Diverticulitis    Dizziness    DVT (deep venous thrombosis) ([place]) 1980s x2 -    between birth of two sons. Saw hematology - was told she has a hypercoagulable disorder, should be on Coumadin lifelong.   Dysrhythmia    atrial fib   Family history of adverse reaction to anesthesia    "mother gets PONV"   Gallbladder disease    GERD (gastroesophageal reflux disease)    GIB (gastrointestinal bleeding) 04/14/2018   Halothane adverse reaction    narrow small opening   Heart murmur    Hiatal hernia    History of hiatal hernia    "small one/CTA 05/01/2016" (05/07/2016)   History of kidney stones    HTN (hypertension)    Hypercholesteremia    hx; "brought it down w/diet" (05/07/2016)   Hyperglycemia    a. A1c 6.1 in 2014.   Inguinal hernia    Iron deficiency anemia due to chronic blood loss 04/14/2018   Iron malabsorption 04/14/2018   Left sided sciatica    Normal cardiac stress test 06/2012   Obesity    OSA (obstructive sleep apnea)    a. Mild, did not tolerate CPAP (05/07/2016)   PAF (paroxysmal atrial fibrillation) ([place])    a. s/p afib ablation  at [facility] 2010 (Dr. Annabell Howells). b. Recurrent AF s/p DCCV 06/2010. c. On flecainide.    Paroxysmal atrial fibrillation ([place]) 09/09/2009   Qualifier: Diagnosis of  By: Selena Batten CMA, [place]     PONV (postoperative nausea and vomiting)    PPD positive, treated 1977   "treated for 1 yr after exposure to patient"   Pre-diabetes    RSV infection ~ 2006   Vertigo    Wandering (atrial) pacemaker    a. Remotely per notes.  (  pt. states has a wandering p wave ) no pacemaker   Current Medications: Current Meds  Medication Sig   acetaminophen (TYLENOL) 650 MG CR tablet Take 1,300 mg by mouth every 8 (eight) hours as needed for pain.    cetirizine (ZYRTEC) 10 MG tablet Take 10 mg by mouth daily.   Cholecalciferol (VITAMIN  D3) 1000 UNITS CAPS Take 2,000 Units by mouth daily.   dicyclomine (BENTYL) 10 MG capsule TAKE 1 CAPSULE BY MOUTH 30 MINS BEFORE MEALS AND AT BEDTIME AS NEEDED FOR PAIN/CRAMPING   ezetimibe (ZETIA) 10 MG tablet TAKE 1 TABLET BY MOUTH EVERY DAY   FLOVENT HFA 220 MCG/ACT inhaler Inhale 1 puff into the lungs 2 (two) times daily.   fluticasone (FLONASE) 50 MCG/ACT nasal spray Place 1 spray into both nostrils 2 (two) times daily.    isosorbide mononitrate (IMDUR) 30 MG 24 hr tablet Take 1 tablet (30 mg total) by mouth daily.   losartan (COZAAR) 25 MG tablet Take 1 tablet (25 mg total) by mouth daily.   meclizine (ANTIVERT) 25 MG tablet Take 1 tablet (25 mg total) by mouth 3 (three) times daily as needed for dizziness.   Multiple Vitamin (MULTIVITAMIN WITH MINERALS) TABS tablet Take 1 tablet by mouth daily.   nitroGLYCERIN (NITROSTAT) 0.4 MG SL tablet PLACE 1 TABLET UNDER THE TONGUE EVERY 5 MINUTES AS NEEDED.   omeprazole (PRILOSEC) 40 MG capsule Take 1 capsule (40 mg total) by mouth 2 (two) times daily.   polyethylene glycol (MIRALAX) 17 g packet Take 17 g by mouth 2 (two) times daily. Titrate as needed   PROAIR HFA 108 (90 BASE) MCG/ACT inhaler Inhale 2 puffs into the lungs every 6 (six) hours as needed for wheezing or shortness of breath.    sucralfate (CARAFATE) 1 g tablet Take 1 tablet (1 g total) by mouth every 8 (eight) hours as needed. Slowly dissolve 1 tablet in 1 tablespoon of distilled water before ingestion   venlafaxine XR (EFFEXOR-XR) 37.5 MG 24 hr capsule Take 37.5 mg by mouth daily with breakfast.    verapamil (CALAN) 80 MG tablet Take 160 mg by mouth 2 (two) times daily.   warfarin (COUMADIN) 2 MG tablet TAKE AS DIRECTED BY COUMADIN CLINIC    Allergies:   Aspirin, Banana, Food, Iodinated contrast media, Latex, Meperidine hcl, Peanut-containing drug products, Penicillins, Statins, Sulfonamide derivatives, Tape, Alfuzosin, Halothane, Other, and Sulfamethoxazole   Social History    Tobacco Use   Smoking status: Never    Passive exposure: Past   Smokeless tobacco: Never  Vaping Use   Vaping Use: Never used  Substance Use Topics   Alcohol use: Not Currently    Comment: rare   Drug use: No    Family Hx: The patient's family history includes Colon polyps in her father; Diabetes in her maternal grandmother; Heart attack in her maternal grandmother, mother, and paternal grandfather; Hyperlipidemia in her father and maternal grandmother; Hypertension in her maternal grandfather, maternal grandmother, and mother; Lupus in  her sister; Stroke in her maternal grandfather, mother, and paternal grandmother; Thyroid disease in her mother and sister. There is no history of Colon cancer or Stomach cancer.  ROS see HPI  EKGs/Labs/Other Test Reviewed:    EKG:  EKG is  ordered today.  The ekg ordered today demonstrates NSR, HR 77, normal axis, QTc 450, low voltage, no ST-T wave changes  Recent Labs: 01/27/2021: TSH 1.170 03/29/2021: ALT 31; B Natriuretic Peptide 26.1 03/30/2021: Magnesium 2.1 05/26/2021: BUN 12; Creatinine, Ser 0.60; Hemoglobin 13.8; Platelets 368; Potassium 3.9; Sodium 137   Recent Lipid Panel No results for input(s): CHOL, TRIG, HDL, VLDL, LDLCALC, LDLDIRECT in the last 8760 hours.   Risk Assessment/Calculations:    CHA2DS2-VASc Score = 5   This indicates a 7.2% annual risk of stroke. The patient's score is based upon: CHF History: 0 HTN History: 1 Diabetes History: 1 Stroke History: 0 Vascular Disease History: 1 Age Score: 1 Gender Score: 1        Physical Exam:    VS:  BP 130/68 (BP Location: Right Arm, Patient Position: Sitting, Cuff Size: Normal)    Pulse 77    Ht '5\' 4"'$  (1.626 m)    Wt 231 lb 9.6 oz (105.1 kg)    SpO2 96%    BMI 39.75 kg/m     Wt Readings from Last 3 Encounters:  05/27/21 231 lb 9.6 oz (105.1 kg)  05/26/21 228 lb (103.4 kg)  05/12/21 231 lb 3.2 oz (104.9 kg)    Constitutional:      Appearance: Healthy appearance. Not in  distress.  Neck:     Vascular: JVD normal.  Pulmonary:     Effort: Pulmonary effort is normal.     Breath sounds: No wheezing. No rales.  Cardiovascular:     Normal rate. Regular rhythm. Normal S1. Normal S2.      Murmurs: There is a grade 1/6 systolic murmur at the URSB.  Edema:    Peripheral edema absent.  Abdominal:     Palpations: Abdomen is soft.  Skin:    General: Skin is warm and dry.  Neurological:     Mental Status: Alert and oriented to person, place and time.     Cranial Nerves: Cranial nerves are intact.        ASSESSMENT & PLAN:   Angina pectoris (HCC) Recent CCTA with mild nonobstructive disease and calcium score 68.  She continued to have exertional anginal symptoms relieved by nitroglycerin.  At last visit, she was placed on long-acting nitrates with isosorbide mononitrate.  Since that time, she has had no further chest symptoms.  Given the results of her CCTA and ongoing symptoms, I suspect that she has microvascular ischemia.  We discussed the pathophysiology today.  Recommend continuing isosorbide mononitrate 30 mg daily.  She has been intolerant to statins in the past.  Continue ezetimibe 10 mg daily.  LDL in January was 125 according to [facility].  If her LDL does remain above 70, consider referral to the Pharm.D. clinic for initiation of PCSK9 inhibitor therapy.  Follow-up with Dr. Harrell Gave in 6 months.  Preoperative cardiovascular examination Ms. Centola's perioperative risk of a major cardiac event is 0.9% according to the Revised Cardiac Risk Index (RCRI).  Therefore, she is at low risk for perioperative complications.   Her functional capacity is good at 4.73 METs according to the Duke Activity Status Index (DASI). Recommendations: According to ACC/AHA guidelines, no further cardiovascular testing needed.  The patient may proceed to surgery at  acceptable risk.   Antiplatelet and/or Anticoagulation Recommendations: Coumadin can by held for 5 days prior to surgery.   Please resume post op when felt to be safe. The patient will require Lovenox bridging while off of Coumadin.  She will see our Coumadin clinic today to arrange this.   Paroxysmal atrial fibrillation (HCC) She is maintaining sinus rhythm.  She is no longer on antiarrhythmic drug therapy.  She underwent PVI ablation in 2010 at [facility] and again in 2018 with Dr. Rayann Heman here.  Continue verapamil 160 mg twice daily.  Follow-up with EP as planned.  Secondary hypercoagulable state ([place]) Continue warfarin therapy.  This is managed with our Coumadin clinic.  She has a history of recurrent DVT and requires bridging Lovenox therapy while off of Coumadin for her upcoming procedures.  Essential hypertension Blood pressure is well controlled.  Continue isosorbide 30 mg daily, losartan 25 mg daily, verapamil 160 mg twice daily.  Mixed hyperlipidemia Continue ezetimibe 10 mg daily.  If her LDL remains <70, consider referral to the Pharm.D. clinic for initiation of PCSK9 inhibitor therapy.           Dispo:  Return in about 6 months (around 11/27/2021) for Routine follow up in 6 months with Dr. Harrell Gave. .   Medication Adjustments/Labs and Tests Ordered: Current medicines are reviewed at length with the patient today.  Concerns regarding medicines are outlined above.  Tests Ordered: Orders Placed This Encounter  Procedures   EKG 12-Lead   Medication Changes: No orders of the defined types were placed in this encounter.  Signed, Richardson Dopp, PA-C  05/27/2021 3:29 PM    [facility] [address], [place], [place]  [address] Phone: [phone]; Fax: [phone]

## 2021-05-27 NOTE — Patient Instructions (Addendum)
05/29/21: Last dose of warfarin. ? ?05/30/21: No warfarin or enoxaparin (Lovenox). ? ?05/31/21: Inject enoxaparin '100mg'$  in the fatty abdominal tissue at least 2 inches from the belly button twice a day about 12 hours apart, 8am and 8pm rotate sites. No warfarin. ? ?06/01/21: Inject enoxaparin in the fatty tissue every 12 hours, 8am and 8pm. No warfarin. ? ?06/02/21: Inject enoxaparin in the fatty tissue every 12 hours, 8am and 8pm. No warfarin. ? ?06/03/21: Inject enoxaparin in the fatty tissue in the morning at 8 am (No PM dose). No warfarin. ? ?06/04/21: Procedure Day - No enoxaparin  ? ?06/05/21: Inject enoxaparin in the fatty tissue every 12 hours, 8am and 8pm. No warfarin. ? ?06/06/21: Inject enoxaparin in the fatty tissue every 12 hours, 8am and 8pm. No warfarin. ? ?06/07/21: Inject enoxaparin in the fatty tissue every 12 hours, 8am and 8pm. No warfarin. ? ?06/08/21: Inject enoxaparin in the fatty tissue every 12 hours, 8am and 8pm. No warfarin. ? ?06/09/21: Inject enoxaparin in the fatty tissue in the morning at 8 am (No PM dose). No warfarin. ? ?06/10/21: Procedure Day - No enoxaparin - Resume warfarin in the evening or as directed by doctor (take an extra one ('2mg'$ ) tablet with usual dose for 2 days then resume normal dose). ? ?06/11/21: Resume enoxaparin inject in the fatty tissue every 12 hours and take warfarin ? ?06/12/21: Inject enoxaparin in the fatty tissue every 12 hours and take warfarin ? ?06/13/21: Inject enoxaparin in the fatty tissue every 12 hours and take warfarin ? ?06/14/21: Inject enoxaparin in the fatty tissue every 12 hours and take warfarin ? ?06/15/21: Inject enoxaparin in the fatty tissue every 12 hours and take warfarin ? ?06/16/21: Inject enoxaparin in the fatty tissue in the morning at 8 am and report to warfarin appt to check INR.  ?

## 2021-05-27 NOTE — Assessment & Plan Note (Signed)
Continue ezetimibe 10 mg daily.  If her LDL remains <70, consider referral to the Pharm.D. clinic for initiation of PCSK9 inhibitor therapy. ?

## 2021-05-27 NOTE — Progress Notes (Addendum)
Anesthesia Chart Review   Case: [id number] Date/Time: 06/10/21 1447   Procedure: RIGHT KNEE ARTHROSCOPY (Right: Knee)   Anesthesia type: Choice   Pre-op diagnosis: RIGHT KNEE MEDIAL MENISCUS TEAR AND LATERAL OSTEOCHONDRAL DEFECT   Location: [facility] 06 [facility]   Surgeons: Melrose Nakayama, MD       DISCUSSION:66 y.o. never smoker with h/o PONV, GERD, atrial flutter, PAF (on Warfarin), DVT, OSA, right knee medial meniscus tear scheduled for above procedure 06/10/21 with Dr. Melrose Nakayama.   Pt has upcoming appointment with cardiology for preoperative evaluation.   Seen by cardiology 05/27/2021. Per OV note, "Preoperative cardiovascular examination [name]'s perioperative risk of a major cardiac event is 0.9% according to the Revised Cardiac Risk Index (RCRI).  Therefore, she is at low risk for perioperative complications.   Her functional capacity is good at 4.73 METs according to the Duke Activity Status Index (DASI). Recommendations: According to ACC/AHA guidelines, no further cardiovascular testing needed.  The patient may proceed to surgery at acceptable risk.   Antiplatelet and/or Anticoagulation Recommendations: Coumadin can by held for 5 days prior to surgery.  Please resume post op when felt to be safe. The patient will require Lovenox bridging while off of Coumadin.  She will see our Coumadin clinic today to arrange this."  Lovenox bridge instructions given to pt, outlined in 05/27/21 encounter.   Anticipate pt can proceed with planned procedure barring acute status change.   VS: BP (!) 155/89    Pulse 83    Temp 37.1 C (Oral)    Resp 16    Ht '5\' 4"'$  (1.626 m)    Wt 103.4 kg    SpO2 96%    BMI 39.14 kg/m   PROVIDERS: Antony Contras, MD is PCP    LABS: Labs reviewed: Acceptable for surgery. (all labs ordered are listed, but only abnormal results are displayed)  Labs Reviewed  BASIC METABOLIC PANEL - Abnormal; Notable for the following components:      Result Value   Glucose,  Bld 154 (*)    All other components within normal limits  GLUCOSE, CAPILLARY - Abnormal; Notable for the following components:   Glucose-Capillary 152 (*)    All other components within normal limits  HEMOGLOBIN A1C - Abnormal; Notable for the following components:   Hgb A1c MFr Bld 6.4 (*)    All other components within normal limits  CBC     IMAGES:   EKG: 05/12/2021 Rate 81 bpm  SR  CV: Echo 03/14/2021 1. Left ventricular ejection fraction, by estimation, is 60 to 65%. The  left ventricle has normal function. The left ventricle has no regional  wall motion abnormalities. Left ventricular diastolic parameters are  consistent with Grade II diastolic  dysfunction (pseudonormalization). Elevated left ventricular end-diastolic  pressure.   2. Right ventricular systolic function is normal. The right ventricular  size is normal.   3. Left atrial size was mild to moderately dilated.   4. The mitral valve is grossly normal. Mild mitral valve regurgitation.  No evidence of mitral stenosis.   5. The aortic valve is grossly normal. Aortic valve regurgitation is not  visualized. No aortic stenosis is present.   6. The inferior vena cava is normal in size with <50% respiratory  variability, suggesting right atrial pressure of 8 mmHg.   Stress Test 05/14/2015 Nuclear stress EF: 67%. There was no ST segment deviation noted during stress. There is a large defect of moderate severity present in the basal inferolateral,  basal anterolateral, mid inferolateral, apical lateral and apex location. The defect is non-reversible.There is a medium defect of moderate severity present in the mid anteroseptal, mid inferoseptal and apical septal location. The defect is non-reversible. Defects are consistent with breast attenuation artifact. No ischemia noted. This is a low risk study. The left ventricular ejection fraction is hyperdynamic (>65%). Past Medical History:  Diagnosis Date   Allergic  rhinitis    Anginal pain ([place])    Arthritis    "back" (05/07/2016)   Asthma    Atrial flutter ([place])    a. Remotely per notes.   Carpal tunnel syndrome, bilateral    Colon polyps    Diverticulitis    Dizziness    DVT (deep venous thrombosis) ([place]) 1980s x2 -    between birth of two sons. Saw hematology - was told she has a hypercoagulable disorder, should be on Coumadin lifelong.   Dysrhythmia    atrial fib   Family history of adverse reaction to anesthesia    "mother gets PONV"   Gallbladder disease    GERD (gastroesophageal reflux disease)    GIB (gastrointestinal bleeding) 04/14/2018   Halothane adverse reaction    narrow small opening   Heart murmur    Hiatal hernia    History of hiatal hernia    "small one/CTA 05/01/2016" (05/07/2016)   History of kidney stones    HTN (hypertension)    Hypercholesteremia    hx; "brought it down w/diet" (05/07/2016)   Hyperglycemia    a. A1c 6.1 in 2014.   Inguinal hernia    Iron deficiency anemia due to chronic blood loss 04/14/2018   Iron malabsorption 04/14/2018   Left sided sciatica    Normal cardiac stress test 06/2012   Obesity    OSA (obstructive sleep apnea)    a. Mild, did not tolerate CPAP (05/07/2016)   PAF (paroxysmal atrial fibrillation) ([place])    a. s/p afib ablation at [facility] 2010 (Dr. Annabell Howells). b. Recurrent AF s/p DCCV 06/2010. c. On flecainide.    Paroxysmal atrial fibrillation ([place]) 09/09/2009   Qualifier: Diagnosis of  By: Selena Batten CMA, [place]     PONV (postoperative nausea and vomiting)    PPD positive, treated 1977   "treated for 1 yr after exposure to patient"   Pre-diabetes    RSV infection ~ 2006   Vertigo    Wandering (atrial) pacemaker    a. Remotely per notes.  (  pt. states has a wandering p wave ) no pacemaker    Past Surgical History:  Procedure Laterality Date   ANKLE ARTHODESIS W/ ARTHROSCOPY Right 11/12/2020   ATRIAL ABLATION SURGERY  05/07/2016   "fib and flutter"   ATRIAL FIBRILLATION  ABLATION  2010   at [place] N/A 05/07/2016   Procedure: Atrial Fibrillation Ablation;  Surgeon: Thompson Grayer, MD;  Location: [facility];  Service: Cardiovascular;  Laterality: N/A;   BREAST CYST ASPIRATION Bilateral    CARDIAC CATHETERIZATION  2008   CARPAL TUNNEL RELEASE Bilateral    CATARACT EXTRACTION Bilateral 2022   CESAREAN SECTION  1986; 1988   COLON RESECTION     COLONOSCOPY  X 2   COLONOSCOPY W/ BIOPSIES AND POLYPECTOMY  X 2   CYSTOSCOPY/URETEROSCOPY/HOLMIUM LASER/STENT PLACEMENT Left 12/23/2019   Procedure: CYSTOSCOPY/URETEROSCOPY/HOLMIUM LASER/STENT PLACEMENT;  Surgeon: Robley Fries, MD;  Location[facility];  Service: Urology;  Laterality: Left;   ESOPHAGOGASTRODUODENOSCOPY     ESOPHAGOGASTRODUODENOSCOPY (EGD) WITH PROPOFOL N/A 07/19/2017  Procedure: ESOPHAGOGASTRODUODENOSCOPY (EGD) WITH PROPOFOL;  Surgeon: Yetta Flock, MD;  [facility];  Service: Gastroenterology;  Laterality: N/A;   INGUINAL HERNIA REPAIR Right    LAPAROSCOPIC CHOLECYSTECTOMY  1989   POLYPECTOMY N/A 07/19/2017   Procedure: POLYPECTOMY;  Surgeon: Yetta Flock, MD;  [facility];  Service: Gastroenterology;  Laterality: N/A;   PROCTOSCOPY N/A 08/06/2017   Procedure: PROCTOSCOPY;  Surgeon: Michael Boston, MD;  Location[facility];  Service: General;  Laterality: N/A;   TMJ ARTHROPLASTY     TUBAL LIGATION  1988    MEDICATIONS:  acetaminophen (TYLENOL) 650 MG CR tablet   cetirizine (ZYRTEC) 10 MG tablet   Cholecalciferol (VITAMIN D3) 1000 UNITS CAPS   dicyclomine (BENTYL) 10 MG capsule   ezetimibe (ZETIA) 10 MG tablet   FLOVENT HFA 220 MCG/ACT inhaler   fluticasone (FLONASE) 50 MCG/ACT nasal spray   isosorbide mononitrate (IMDUR) 30 MG 24 hr tablet   losartan (COZAAR) 25 MG tablet   meclizine (ANTIVERT) 25 MG tablet   Multiple Vitamin (MULTIVITAMIN WITH MINERALS) TABS tablet   nitroGLYCERIN (NITROSTAT) 0.4 MG SL tablet    omeprazole (PRILOSEC) 40 MG capsule   polyethylene glycol (MIRALAX) 17 g packet   PROAIR HFA 108 (90 BASE) MCG/ACT inhaler   sucralfate (CARAFATE) 1 g tablet   venlafaxine XR (EFFEXOR-XR) 37.5 MG 24 hr capsule   verapamil (CALAN) 80 MG tablet   warfarin (COUMADIN) 2 MG tablet   No current facility-administered medications for this encounter.    Kelly Felix Ward, PA-C [facility] [phone]

## 2021-05-28 NOTE — Telephone Encounter (Signed)
Patient called and stated that she is aware. ?

## 2021-05-28 NOTE — Anesthesia Preprocedure Evaluation (Signed)
Anesthesia Evaluation    Airway        Dental   Pulmonary           Cardiovascular hypertension,      Neuro/Psych    GI/Hepatic   Endo/Other    Renal/GU      Musculoskeletal   Abdominal   Peds  Hematology   Anesthesia Other Findings   Reproductive/Obstetrics                             Anesthesia Physical Anesthesia Plan  ASA:   Anesthesia Plan:    Post-op Pain Management:    Induction:   PONV Risk Score and Plan:   Airway Management Planned:   Additional Equipment:   Intra-op Plan:   Post-operative Plan:   Informed Consent:   Plan Discussed with:   Anesthesia Plan Comments: (See PAT note 05/26/2021, Konrad Felix Ward, PA-C)        Anesthesia Quick Evaluation

## 2021-05-28 NOTE — Telephone Encounter (Signed)
Patient returned phone call and is aware to hold her coumadin. ?

## 2021-05-30 ENCOUNTER — Other Ambulatory Visit: Payer: Self-pay

## 2021-05-30 ENCOUNTER — Encounter: Payer: Self-pay | Admitting: Podiatry

## 2021-05-30 ENCOUNTER — Ambulatory Visit (INDEPENDENT_AMBULATORY_CARE_PROVIDER_SITE_OTHER): Payer: Medicare Other | Admitting: Podiatry

## 2021-05-30 DIAGNOSIS — B351 Tinea unguium: Secondary | ICD-10-CM

## 2021-05-30 DIAGNOSIS — D689 Coagulation defect, unspecified: Secondary | ICD-10-CM

## 2021-05-30 DIAGNOSIS — M79675 Pain in left toe(s): Secondary | ICD-10-CM

## 2021-05-30 DIAGNOSIS — E1169 Type 2 diabetes mellitus with other specified complication: Secondary | ICD-10-CM

## 2021-05-30 DIAGNOSIS — Z7901 Long term (current) use of anticoagulants: Secondary | ICD-10-CM | POA: Diagnosis not present

## 2021-05-30 DIAGNOSIS — M79674 Pain in right toe(s): Secondary | ICD-10-CM

## 2021-05-30 NOTE — Progress Notes (Signed)
This patient returns to my office for at risk foot care.  This patient requires this care by a professional since this patient will be at risk due to having coagulation defect.  Patient is taking coumadin.  This patient is unable to cut nails herself since the patient cannot reach her nails.These nails are painful walking and wearing shoes.  This patient presents for at risk foot care today.  General Appearance  Alert, conversant and in no acute stress.  Vascular  Dorsalis pedis and posterior tibial  pulses are palpable  bilaterally.  Capillary return is within normal limits  bilaterally. Temperature is within normal limits  bilaterally.  Neurologic  Senn-Weinstein monofilament wire test within normal limits  bilaterally. Muscle power within normal limits bilaterally.  Nails Thick disfigured discolored nails with subungual debris  Second left and first and third right foot.  No evidence of bacterial infection or drainage bilaterally.  Orthopedic  No limitations of motion  feet .  No crepitus or effusions noted.  No bony pathology or digital deformities noted.  Skin  normotropic skin with no porokeratosis noted bilaterally.  No signs of infections or ulcers noted.     Onychomycosis  Pain in right toes  Pain in left toes  Consent was obtained for treatment procedures.   Mechanical debridement of nails 1-5  bilaterally performed with a nail nipper.  Filed with dremel without incident.    Return office visit   9 weeks                  Told patient to return for periodic foot care and evaluation due to potential at risk complications.   Kalise Fickett DPM  

## 2021-06-04 ENCOUNTER — Other Ambulatory Visit: Payer: Self-pay | Admitting: Gastroenterology

## 2021-06-04 ENCOUNTER — Encounter: Payer: Self-pay | Admitting: Gastroenterology

## 2021-06-04 ENCOUNTER — Ambulatory Visit (AMBULATORY_SURGERY_CENTER): Payer: Medicare Other | Admitting: Gastroenterology

## 2021-06-04 VITALS — BP 163/86 | HR 62 | Temp 97.3°F | Resp 16 | Ht 64.0 in | Wt 228.0 lb

## 2021-06-04 DIAGNOSIS — Z86010 Personal history of colonic polyps: Secondary | ICD-10-CM

## 2021-06-04 DIAGNOSIS — D123 Benign neoplasm of transverse colon: Secondary | ICD-10-CM

## 2021-06-04 DIAGNOSIS — D122 Benign neoplasm of ascending colon: Secondary | ICD-10-CM | POA: Diagnosis not present

## 2021-06-04 MED ORDER — SODIUM CHLORIDE 0.9 % IV SOLN: 500.0000 mL | Freq: Once | INTRAVENOUS | Status: DC

## 2021-06-04 NOTE — Progress Notes (Signed)
Called to room to assist during endoscopic procedure.  Patient ID and intended procedure confirmed with present staff. Received instructions for my participation in the procedure from the performing physician.  

## 2021-06-04 NOTE — Op Note (Signed)
[place] ?Patient Name: [name] ?Procedure Date: 06/04/2021 9:58 AM ?MRN: [id number] ?Endoscopist: Carlota Raspberry. Havery Moros , MD ?Age: 66 ?Referring MD:  ?Date of Birth: 03/23/1956 ?Gender: Female ?Account #: [id number] ?Procedure:                Colonoscopy ?Indications:              High risk colon cancer surveillance: Personal  ?                          history of colonic polyps (03/2017 - 3 adenomas - 1  ?                          advanced adenoma) ?Medicines:                Monitored Anesthesia Care ?Procedure:                Pre-Anesthesia Assessment: ?                          - Prior to the procedure, a History and Physical  ?                          was performed, and patient medications and  ?                          allergies were reviewed. The patient's tolerance of  ?                          previous anesthesia was also reviewed. The risks  ?                          and benefits of the procedure and the sedation  ?                          options and risks were discussed with the patient.  ?                          All questions were answered, and informed consent  ?                          was obtained. Prior Anticoagulants: The patient has  ?                          taken Coumadin (warfarin), last dose was 5 days  ?                          prior to procedure. On Lovenox, last dose  ?                          yesterday. ASA Grade Assessment: II - A patient  ?                          with mild systemic disease. After reviewing the  ?  risks and benefits, the patient was deemed in  ?                          satisfactory condition to undergo the procedure. ?                          After obtaining informed consent, the colonoscope  ?                          was passed under direct vision. Throughout the  ?                          procedure, the patient's blood pressure, pulse, and  ?                          oxygen saturations were monitored  continuously. The  ?                          Olympus CF-[id number] (#[id number]) Colonoscope was  ?                          introduced through the anus and advanced to the the  ?                          cecum, identified by appendiceal orifice and  ?                          ileocecal valve. The colonoscopy was performed  ?                          without difficulty. The patient tolerated the  ?                          procedure well. The quality of the bowel  ?                          preparation was adequate. The ileocecal valve,  ?                          appendiceal orifice, and rectum were photographed. ?Scope In: 10:10:56 AM ?Scope Out: 10:28:33 AM ?Scope Withdrawal Time: 0 hours 14 minutes 7 seconds  ?Total Procedure Duration: 0 hours 17 minutes 37 seconds  ?Findings:                 The perianal and digital rectal examinations were  ?                          normal. ?                          A 3 mm polyp was found in the ascending colon. The  ?                          polyp was sessile. The polyp was removed with a  ?  cold snare. Resection and retrieval were complete. ?                          Three sessile polyps were found in the transverse  ?                          colon. The polyps were 3 to 4 mm in size. These  ?                          polyps were removed with a cold snare. Resection  ?                          and retrieval were complete. ?                          A few small-mouthed diverticula were found in the  ?                          sigmoid colon. ?                          There was evidence of a prior end-to-end  ?                          colo-colonic anastomosis in the sigmoid colon. This  ?                          was patent and was characterized by healthy  ?                          appearing mucosa. ?                          Internal hemorrhoids were found during  ?                          retroflexion. The hemorrhoids were small. ?                           Anal papilla(e) were hypertrophied. ?                          The exam was otherwise without abnormality. ?Complications:            No immediate complications. Estimated blood loss:  ?                          Minimal. ?Estimated Blood Loss:     Estimated blood loss was minimal. ?Impression:               - One 3 mm polyp in the ascending colon, removed  ?                          with a cold snare. Resected and retrieved. ?                          -  Three 3 to 4 mm polyps in the transverse colon,  ?                          removed with a cold snare. Resected and retrieved. ?                          - Diverticulosis in the sigmoid colon. ?                          - Patent end-to-end colo-colonic anastomosis,  ?                          characterized by healthy appearing mucosa. ?                          - Internal hemorrhoids. ?                          - Anal papilla(e) were hypertrophied. ?                          - The examination was otherwise normal. ?Recommendation:           - Patient has a contact number available for  ?                          emergencies. The signs and symptoms of potential  ?                          delayed complications were discussed with the  ?                          patient. Return to normal activities tomorrow.  ?                          Written discharge instructions were provided to the  ?                          patient. ?                          - Resume previous diet. ?                          - Continue present medications. ?                          - Resume Coumadin tonight ?                          - Resume Lovenox tomorrow AM ?                          - Await pathology results. ?Carlota Raspberry. Osiel Stick, MD ?06/04/2021 10:36:13 AM ?This report has been signed electronically. ?

## 2021-06-04 NOTE — Progress Notes (Signed)
A and O x3. Report to RN. Tolerated MAC anesthesia well.  ?

## 2021-06-04 NOTE — Patient Instructions (Signed)
Handout on polyps and diverticulosis given. ? ?Await pathology results. ? ?Resume Coumadin tonight.  Resume Lovenox tomorrow morning. ? ? ?YOU HAD AN ENDOSCOPIC PROCEDURE TODAY AT [facility]:   Refer to the procedure report that was given to you for any specific questions about what was found during the examination.  If the procedure report does not answer your questions, please call your gastroenterologist to clarify.  If you requested that your care partner not be given the details of your procedure findings, then the procedure report has been included in a sealed envelope for you to review at your convenience later. ? ?YOU SHOULD EXPECT: Some feelings of bloating in the abdomen. Passage of more gas than usual.  Walking can help get rid of the air that was put into your GI tract during the procedure and reduce the bloating. If you had a lower endoscopy (such as a colonoscopy or flexible sigmoidoscopy) you may notice spotting of blood in your stool or on the toilet paper. If you underwent a bowel prep for your procedure, you may not have a normal bowel movement for a few days. ? ?Please Note:  You might notice some irritation and congestion in your nose or some drainage.  This is from the oxygen used during your procedure.  There is no need for concern and it should clear up in a day or so. ? ?SYMPTOMS TO REPORT IMMEDIATELY: ? ?Following lower endoscopy (colonoscopy or flexible sigmoidoscopy): ? Excessive amounts of blood in the stool ? Significant tenderness or worsening of abdominal pains ? Swelling of the abdomen that is new, acute ? Fever of 100?F or higher ?For urgent or emergent issues, a gastroenterologist can be reached at any hour by calling [phone]. ?Do not use MyChart messaging for urgent concerns.  ? ? ?DIET:  We do recommend a small meal at first, but then you may proceed to your regular diet.  Drink plenty of fluids but you should avoid alcoholic beverages for 24  hours. ? ?ACTIVITY:  You should plan to take it easy for the rest of today and you should NOT DRIVE or use heavy machinery until tomorrow (because of the sedation medicines used during the test).   ? ?FOLLOW UP: ?Our staff will call the number listed on your records 48-72 hours following your procedure to check on you and address any questions or concerns that you may have regarding the information given to you following your procedure. If we do not reach you, we will leave a message.  We will attempt to reach you two times.  During this call, we will ask if you have developed any symptoms of COVID 19. If you develop any symptoms (ie: fever, flu-like symptoms, shortness of breath, cough etc.) before then, please call [phone].  If you test positive for Covid 19 in the 2 weeks post procedure, please call and report this information to [place].   ? ?If any biopsies were taken you will be contacted by phone or by letter within the next 1-3 weeks.  Please call us at [phone] if you have not heard about the biopsies in 3 weeks.  ? ? ?SIGNATURES/CONFIDENTIALITY: ?You and/or your care partner have signed paperwork which will be entered into your electronic medical record.  These signatures attest to the fact that that the information above on your After Visit Summary has been reviewed and is understood.  Full responsibility of the confidentiality of this discharge information lies with you and/or your care-partner.  ?

## 2021-06-04 NOTE — Progress Notes (Signed)
Pt's states no medical or surgical changes since previsit or office visit. VS assessed by D.T 

## 2021-06-04 NOTE — Progress Notes (Signed)
[facility] History and Physical   Primary Care Physician:  Tally Joe, MD   Reason for Procedure:   History of colon polyps  Plan:    colonoscopy     HPI: [name] is a 66 y.o. female  here for colonoscopy surveillance - 03/2017 lat exam with advanced adenoma. Patient denies any bowel symptoms at this time. No family history of colon cancer known. Otherwise feels well without any cardiopulmonary symptoms. Coumadin held for 5 days, on lovenox bridge.   Past Medical History:  Diagnosis Date   Allergic rhinitis    Anginal pain (HCC)    Arthritis    "back" (05/07/2016)   Asthma    Atrial flutter (HCC)    a. Remotely per notes.   Carpal tunnel syndrome, bilateral    Colon polyps    Diverticulitis    Dizziness    DVT (deep venous thrombosis) (HCC) 1980s x2 -    between birth of two sons. Saw hematology - was told she has a hypercoagulable disorder, should be on Coumadin lifelong.   Dysrhythmia    atrial fib   Family history of adverse reaction to anesthesia    "mother gets PONV"   Gallbladder disease    GERD (gastroesophageal reflux disease)    GIB (gastrointestinal bleeding) 04/14/2018   Halothane adverse reaction    narrow small opening   Heart murmur    Hiatal hernia    History of hiatal hernia    "small one/CTA 05/01/2016" (05/07/2016)   History of kidney stones    HTN (hypertension)    Hypercholesteremia    hx; "brought it down w/diet" (05/07/2016)   Hyperglycemia    a. A1c 6.1 in 2014.   Inguinal hernia    Iron deficiency anemia due to chronic blood loss 04/14/2018   Iron malabsorption 04/14/2018   Left sided sciatica    Normal cardiac stress test 06/2012   Obesity    OSA (obstructive sleep apnea)    a. Mild, did not tolerate CPAP (05/07/2016)   PAF (paroxysmal atrial fibrillation) (HCC)    a. s/p afib ablation at [facility] 2010 (Dr. Sharlene Dory). b. Recurrent AF s/p DCCV 06/2010. c. On flecainide.    Paroxysmal atrial fibrillation (HCC)  09/09/2009   Qualifier: Diagnosis of  By: Susette Racer CMA, Jewel     PONV (postoperative nausea and vomiting)    PPD positive, treated 1977   "treated for 1 yr after exposure to patient"   Pre-diabetes    RSV infection ~ 2006   Vertigo    Wandering (atrial) pacemaker    a. Remotely per notes.  (  pt. states has a wandering p wave ) no pacemaker    Past Surgical History:  Procedure Laterality Date   ANKLE ARTHODESIS W/ ARTHROSCOPY Right 11/12/2020   ATRIAL ABLATION SURGERY  05/07/2016   "fib and flutter"   ATRIAL FIBRILLATION ABLATION  2010   at [facility]   ATRIAL FIBRILLATION ABLATION N/A 05/07/2016   Procedure: Atrial Fibrillation Ablation;  Surgeon: Hillis Range, MD;  Location: [facility] INVASIVE CV LAB;  Service: Cardiovascular;  Laterality: N/A;   BREAST CYST ASPIRATION Bilateral    CARDIAC CATHETERIZATION  2008   CARPAL TUNNEL RELEASE Bilateral    CATARACT EXTRACTION Bilateral 2022   CESAREAN SECTION  1986; 1988   COLON RESECTION     COLONOSCOPY  X 2   COLONOSCOPY W/ BIOPSIES AND POLYPECTOMY  X 2   CYSTOSCOPY/URETEROSCOPY/HOLMIUM LASER/STENT PLACEMENT Left 12/23/2019   Procedure: CYSTOSCOPY/URETEROSCOPY/HOLMIUM LASER/STENT PLACEMENT;  Surgeon: Noel Christmas, MD;  [facility];  Service: Urology;  Laterality: Left;   ESOPHAGOGASTRODUODENOSCOPY     ESOPHAGOGASTRODUODENOSCOPY (EGD) WITH PROPOFOL N/A 07/19/2017   Procedure: ESOPHAGOGASTRODUODENOSCOPY (EGD) WITH PROPOFOL;  Surgeon: Benancio Deeds, MD;  [facility];  Service: Gastroenterology;  Laterality: N/A;   INGUINAL HERNIA REPAIR Right    LAPAROSCOPIC CHOLECYSTECTOMY  1989   POLYPECTOMY N/A 07/19/2017   Procedure: POLYPECTOMY;  Surgeon: Benancio Deeds, MD;  [facility];  Service: Gastroenterology;  Laterality: N/A;   PROCTOSCOPY N/A 08/06/2017   Procedure: PROCTOSCOPY;  Surgeon: Karie Soda, MD;  Location: [facility];  Service: General;  Laterality: N/A;   TMJ ARTHROPLASTY     TUBAL  LIGATION  1988    Prior to Admission medications   Medication Sig Start Date End Date Taking? Authorizing Provider  acetaminophen (TYLENOL) 650 MG CR tablet Take 1,300 mg by mouth every 8 (eight) hours as needed for pain.    Yes [provider]  cetirizine (ZYRTEC) 10 MG tablet Take 10 mg by mouth daily.   Yes [provider]  Cholecalciferol (VITAMIN D3) 1000 UNITS CAPS Take 2,000 Units by mouth daily.   Yes [provider]  enoxaparin (LOVENOX) 100 MG/ML injection Inject 1 mL (100 mg total) into the skin every 12 (twelve) hours. As instructed by Anticoagulation Clinic 05/27/21  Yes Allred, Fayrene Fearing, MD  ezetimibe (ZETIA) 10 MG tablet TAKE 1 TABLET BY MOUTH EVERY DAY 05/05/21  Yes Hilty, Lisette Abu, MD  FLOVENT HFA 220 MCG/ACT inhaler Inhale 1 puff into the lungs 2 (two) times daily. 04/20/16  Yes [provider]  fluticasone (FLONASE) 50 MCG/ACT nasal spray Place 1 spray into both nostrils 2 (two) times daily.  06/02/12  Yes [provider]  isosorbide mononitrate (IMDUR) 30 MG 24 hr tablet Take 1 tablet (30 mg total) by mouth daily. 05/12/21 08/10/21 Yes Sheilah Pigeon, PA-C  losartan (COZAAR) 25 MG tablet Take 1 tablet (25 mg total) by mouth daily. 12/20/13  Yes Allred, Fayrene Fearing, MD  Multiple Vitamin (MULTIVITAMIN WITH MINERALS) TABS tablet Take 1 tablet by mouth daily.   Yes [provider]  omeprazole (PRILOSEC) 40 MG capsule Take 1 capsule (40 mg total) by mouth 2 (two) times daily. 04/25/21  Yes Aydia Maj, Willaim Rayas, MD  venlafaxine XR (EFFEXOR-XR) 37.5 MG 24 hr capsule Take 37.5 mg by mouth daily with breakfast.  11/21/12  Yes [provider]  verapamil (CALAN) 80 MG tablet Take 160 mg by mouth 2 (two) times daily.   Yes [provider]  dicyclomine (BENTYL) 10 MG capsule TAKE 1 CAPSULE BY MOUTH 30 MINS BEFORE MEALS AND AT BEDTIME AS NEEDED FOR PAIN/CRAMPING 12/23/18   Zehr, Princella Pellegrini, PA-C  meclizine (ANTIVERT) 25 MG tablet Take 1  tablet (25 mg total) by mouth 3 (three) times daily as needed for dizziness. 01/19/18   Linwood Dibbles, MD  nitroGLYCERIN (NITROSTAT) 0.4 MG SL tablet PLACE 1 TABLET UNDER THE TONGUE EVERY 5 MINUTES AS NEEDED. Patient not taking: Reported on 06/04/2021 05/22/21   Sheilah Pigeon, PA-C  polyethylene glycol (MIRALAX) 17 g packet Take 17 g by mouth 2 (two) times daily. Titrate as needed 01/23/21   Zoa Dowty, Willaim Rayas, MD  PROAIR HFA 108 (90 BASE) MCG/ACT inhaler Inhale 2 puffs into the lungs every 6 (six) hours as needed for wheezing or shortness of breath.  03/22/12   [provider]  sucralfate (CARAFATE) 1 g tablet Take 1 tablet (1 g total)  by mouth every 8 (eight) hours as needed. Slowly dissolve 1 tablet in 1 tablespoon of distilled water before ingestion 04/25/21   Annalia Metzger, Willaim Rayas, MD  warfarin (COUMADIN) 2 MG tablet TAKE AS DIRECTED BY COUMADIN CLINIC 12/12/20   Hillis Range, MD    Current Outpatient Medications  Medication Sig Dispense Refill   acetaminophen (TYLENOL) 650 MG CR tablet Take 1,300 mg by mouth every 8 (eight) hours as needed for pain.      cetirizine (ZYRTEC) 10 MG tablet Take 10 mg by mouth daily.     Cholecalciferol (VITAMIN D3) 1000 UNITS CAPS Take 2,000 Units by mouth daily.     enoxaparin (LOVENOX) 100 MG/ML injection Inject 1 mL (100 mg total) into the skin every 12 (twelve) hours. As instructed by Anticoagulation Clinic 30 mL 0   ezetimibe (ZETIA) 10 MG tablet TAKE 1 TABLET BY MOUTH EVERY DAY 90 tablet 3   FLOVENT HFA 220 MCG/ACT inhaler Inhale 1 puff into the lungs 2 (two) times daily.  1   fluticasone (FLONASE) 50 MCG/ACT nasal spray Place 1 spray into both nostrils 2 (two) times daily.      isosorbide mononitrate (IMDUR) 30 MG 24 hr tablet Take 1 tablet (30 mg total) by mouth daily. 90 tablet 1   losartan (COZAAR) 25 MG tablet Take 1 tablet (25 mg total) by mouth daily. 90 tablet 0   Multiple Vitamin (MULTIVITAMIN WITH MINERALS) TABS tablet Take 1 tablet by  mouth daily.     omeprazole (PRILOSEC) 40 MG capsule Take 1 capsule (40 mg total) by mouth 2 (two) times daily. 180 capsule 1   venlafaxine XR (EFFEXOR-XR) 37.5 MG 24 hr capsule Take 37.5 mg by mouth daily with breakfast.      verapamil (CALAN) 80 MG tablet Take 160 mg by mouth 2 (two) times daily.     dicyclomine (BENTYL) 10 MG capsule TAKE 1 CAPSULE BY MOUTH 30 MINS BEFORE MEALS AND AT BEDTIME AS NEEDED FOR PAIN/CRAMPING 360 capsule 1   meclizine (ANTIVERT) 25 MG tablet Take 1 tablet (25 mg total) by mouth 3 (three) times daily as needed for dizziness. 30 tablet 0   nitroGLYCERIN (NITROSTAT) 0.4 MG SL tablet PLACE 1 TABLET UNDER THE TONGUE EVERY 5 MINUTES AS NEEDED. (Patient not taking: Reported on 06/04/2021) 25 tablet 11   polyethylene glycol (MIRALAX) 17 g packet Take 17 g by mouth 2 (two) times daily. Titrate as needed 14 each 0   PROAIR HFA 108 (90 BASE) MCG/ACT inhaler Inhale 2 puffs into the lungs every 6 (six) hours as needed for wheezing or shortness of breath.      sucralfate (CARAFATE) 1 g tablet Take 1 tablet (1 g total) by mouth every 8 (eight) hours as needed. Slowly dissolve 1 tablet in 1 tablespoon of distilled water before ingestion 60 tablet 5   warfarin (COUMADIN) 2 MG tablet TAKE AS DIRECTED BY COUMADIN CLINIC 225 tablet 1   Current Facility-Administered Medications  Medication Dose Route Frequency Provider Last Rate Last Admin   0.9 %  sodium chloride infusion  500 mL Intravenous Once Delayza Lungren, Willaim Rayas, MD        Allergies as of 06/04/2021 - Review Complete 06/04/2021  Allergen Reaction Noted   Aspirin Other (See Comments) 01/12/2013   Banana Swelling and Cough 07/07/2017   Food Other (See Comments) and Cough 05/04/2016   Iodinated contrast media Anaphylaxis 01/12/2013   Latex Hives and Cough 05/01/2021   Meperidine hcl Other (See Comments) 09/10/2009   Peanut-containing  drug products Swelling and Cough 07/07/2017   Penicillins Anaphylaxis 09/10/2009   Statins  Other (See Comments) 01/12/2013   Sulfonamide derivatives Anaphylaxis 09/10/2009   Tape Hives and Dermatitis 07/07/2017   Alfuzosin Other (See Comments) 01/12/2020   Halothane  05/27/2021   Other Other (See Comments) 12/25/2020   Sulfamethoxazole Hives 02/29/2020    Family History  Problem Relation Age of Onset   Hypertension Mother    Stroke Mother    Heart attack Mother        2 previous MIs, 3 stents - CAD first diagnosed 89   Thyroid disease Mother    Hyperlipidemia Father    Colon polyps Father    Lupus Sister    Thyroid disease Sister        x 2   Hyperlipidemia Maternal Grandmother    Heart attack Maternal Grandmother    Diabetes Maternal Grandmother    Hypertension Maternal Grandmother    Hypertension Maternal Grandfather    Stroke Maternal Grandfather        Multiple family members   Stroke Paternal Grandmother    Heart attack Paternal Grandfather        Died at 2 of massive MI   Colon cancer Neg Hx    Stomach cancer Neg Hx     Social History   Socioeconomic History   Marital status: Married    Spouse name: Jillyn Hidden   Number of children: 2   Years of education: Not on file   Highest education level: Master's degree (e.g., MA, MS, MEng, MEd, MSW, MBA)  Occupational History   Occupation: Teacher, adult education: [employer]  Tobacco Use   Smoking status: Never    Passive exposure: Past   Smokeless tobacco: Never  Vaping Use   Vaping Use: Never used  Substance and Sexual Activity   Alcohol use: Not Currently    Comment: rare   Drug use: No   Sexual activity: Not Currently  Other Topics Concern   Not on file  Social History Narrative   Lives with husband   Social Determinants of Health   Financial Resource Strain: Not on file  Food Insecurity: Not on file  Transportation Needs: Not on file  Physical Activity: Not on file  Stress: Not on file  Social Connections: Not on file  Intimate Partner Violence: Not on file    Review of  Systems: All other review of systems negative except as mentioned in the HPI.  Physical Exam: Vital signs BP (!) 145/71   Pulse 77   Temp (!) 97.3 F (36.3 C) (Skin)   Ht 5\' 4"  (1.626 m)   Wt 228 lb (103.4 kg)   SpO2 96%   BMI 39.14 kg/m   General:   Alert,  Well-developed, pleasant and cooperative in NAD Lungs:  Clear throughout to auscultation.   Heart:  Regular rate and rhythm Abdomen:  Soft, nontender and nondistended.   Neuro/Psych:  Alert and cooperative. Normal mood and affect. A and O x 3  Harlin Rain, MD [facility]

## 2021-06-06 ENCOUNTER — Telehealth: Payer: Self-pay | Admitting: *Deleted

## 2021-06-06 NOTE — Telephone Encounter (Signed)
?  Follow up Call- ? ?Call back number 06/04/2021  ?Post procedure Call Back phone  # [phone]  ?Permission to leave phone message Yes  ?Some recent data might be hidden  ?  ? ?Patient questions: ? ?Do you have a fever, pain , or abdominal swelling? No. ?Pain Score  0 * ? ?Have you tolerated food without any problems? Yes.   ? ?Have you been able to return to your normal activities? Yes.   ? ?Do you have any questions about your discharge instructions: ?Diet   No. ?Medications  No. ?Follow up visit  No. ? ?Do you have questions or concerns about your Care? No. ? ?Actions: ?* If pain score is 4 or above: ?No action needed, pain <4. ? ? ?

## 2021-06-08 ENCOUNTER — Other Ambulatory Visit: Payer: Self-pay | Admitting: Internal Medicine

## 2021-06-08 DIAGNOSIS — I480 Paroxysmal atrial fibrillation: Secondary | ICD-10-CM

## 2021-06-09 ENCOUNTER — Encounter (HOSPITAL_COMMUNITY): Payer: Self-pay | Admitting: Orthopaedic Surgery

## 2021-06-09 NOTE — H&P (Signed)
[name] is an 66 y.o. female.   ?Chief Complaint: Right knee pain ?HPI: [name] is here today for evaluation of her right knee. She has had issues with her right knee since a fall about a year ago.  She is getting a sharp catching sensation in her knee.  Occasionally will lock and she will fall.  She has been injected with cortisone which gives her only short-term relief.  She cannot take oral NSAIDs as she is on Coumadin for history of atrial fibrillation and also a factor V Leiden deficiency.  She gets a sharp catching pain is quite debilitating.  She is very frustrated. ? ?MRI:  I reviewed an MRI scan films and report of a study done at the [facility] on 05/05/21 of the right knee demonstrates some early cartilage partial-thickness loss under the patellofemoral and medial compartment.  There is also a focal cartilage defect measuring 4 x 6 mm on the weightbearing surface of the lateral femoral condyle.  There is also tearing of the medial meniscus which is nondisplaced. ? ?Past Medical History:  ?Diagnosis Date  ? Allergic rhinitis   ? Anginal pain ([place])   ? Arthritis   ? "back" (05/07/2016)  ? Asthma   ? Atrial flutter ([place])   ? a. Remotely per notes.  ? Carpal tunnel syndrome, bilateral   ? Colon polyps   ? Diverticulitis   ? Dizziness   ? DVT (deep venous thrombosis) ([place]) 1980s x2 -  ?  between birth of two sons. Saw hematology - was told she has a hypercoagulable disorder, should be on Coumadin lifelong.  ? Dysrhythmia   ? atrial fib  ? Family history of adverse reaction to anesthesia   ? "mother gets PONV"  ? Gallbladder disease   ? GERD (gastroesophageal reflux disease)   ? GIB (gastrointestinal bleeding) 04/14/2018  ? Halothane adverse reaction   ? narrow small opening  ? Heart murmur   ? Hiatal hernia   ? History of hiatal hernia   ? "small one/CTA 05/01/2016" (05/07/2016)  ? History of kidney stones   ? HTN (hypertension)   ? Hypercholesteremia   ? hx; "brought it down w/diet" (05/07/2016)  ?  Hyperglycemia   ? a. A1c 6.1 in 2014.  ? Inguinal hernia   ? Iron deficiency anemia due to chronic blood loss 04/14/2018  ? Iron malabsorption 04/14/2018  ? Left sided sciatica   ? Normal cardiac stress test 06/2012  ? Obesity   ? OSA (obstructive sleep apnea)   ? a. Mild, did not tolerate CPAP (05/07/2016)  ? PAF (paroxysmal atrial fibrillation) ([place])   ? a. s/p afib ablation at [facility] 2010 (Dr. Annabell Howells). b. Recurrent AF s/p DCCV 06/2010. c. On flecainide.   ? Paroxysmal atrial fibrillation ([place]) 09/09/2009  ? Qualifier: Diagnosis of  By: Selena Batten CMA, Jewel    ? PONV (postoperative nausea and vomiting)   ? PPD positive, treated 1977  ? "treated for 1 yr after exposure to patient"  ? Pre-diabetes   ? RSV infection ~ 2006  ? Vertigo   ? Wandering (atrial) pacemaker   ? a. Remotely per notes.  (  pt. states has a wandering p wave ) no pacemaker  ? ? ?Past Surgical History:  ?Procedure Laterality Date  ? ANKLE ARTHODESIS W/ ARTHROSCOPY Right 11/12/2020  ? ATRIAL ABLATION SURGERY  05/07/2016  ? "fib and flutter"  ? ATRIAL FIBRILLATION ABLATION  2010  ? at [facility]  ? ATRIAL FIBRILLATION  ABLATION N/A 05/07/2016  ? Procedure: Atrial Fibrillation Ablation;  Surgeon: Thompson Grayer, MD;  Location: [place] CV LAB;  Service: Cardiovascular;  Laterality: N/A;  ? BREAST CYST ASPIRATION Bilateral   ? CARDIAC CATHETERIZATION  2008  ? CARPAL TUNNEL RELEASE Bilateral   ? CATARACT EXTRACTION Bilateral 2022  ? [place]; 1988  ? COLON RESECTION    ? COLONOSCOPY  X 2  ? COLONOSCOPY W/ BIOPSIES AND POLYPECTOMY  X 2  ? CYSTOSCOPY/URETEROSCOPY/HOLMIUM LASER/STENT PLACEMENT Left 12/23/2019  ? Procedure: CYSTOSCOPY/URETEROSCOPY/HOLMIUM LASER/STENT PLACEMENT;  Surgeon: Robley Fries, MD;  [facility];  Service: Urology;  Laterality: Left;  ? ESOPHAGOGASTRODUODENOSCOPY    ? ESOPHAGOGASTRODUODENOSCOPY (EGD) WITH PROPOFOL N/A 07/19/2017  ? Procedure: ESOPHAGOGASTRODUODENOSCOPY (EGD) WITH PROPOFOL;  Surgeon:  Yetta Flock, MD;  [facility];  Service: Gastroenterology;  Laterality: N/A;  ? INGUINAL HERNIA REPAIR Right   ? [place]  ? POLYPECTOMY N/A 07/19/2017  ? Procedure: POLYPECTOMY;  Surgeon: Yetta Flock, MD;  Location: [facility];  Service: Gastroenterology;  Laterality: N/A;  ? PROCTOSCOPY N/A 08/06/2017  ? Procedure: PROCTOSCOPY;  Surgeon: Michael Boston, MD;  [facility];  Service: General;  Laterality: N/A;  ? TMJ ARTHROPLASTY    ? TUBAL LIGATION  1988  ? ? ?Family History  ?Problem Relation Age of Onset  ? Hypertension Mother   ? Stroke Mother   ? Heart attack Mother   ?     2 previous MIs, 3 stents - CAD first diagnosed 25  ? Thyroid disease Mother   ? Hyperlipidemia Father   ? Colon polyps Father   ? Lupus Sister   ? Thyroid disease Sister   ?     x 2  ? Hyperlipidemia Maternal Grandmother   ? Heart attack Maternal Grandmother   ? Diabetes Maternal Grandmother   ? Hypertension Maternal Grandmother   ? Hypertension Maternal Grandfather   ? Stroke Maternal Grandfather   ?     Multiple family members  ? Stroke Paternal Grandmother   ? Heart attack Paternal Grandfather   ?     Died at 40 of massive MI  ? Colon cancer Neg Hx   ? Stomach cancer Neg Hx   ? ?Social History:  reports that she has never smoked. She has been exposed to tobacco smoke. She has never used smokeless tobacco. She reports that she does not currently use alcohol. She reports that she does not use drugs. ? ?Allergies:  ?Allergies  ?Allergen Reactions  ? Aspirin Other (See Comments)  ?  Makes asthma worse and makes stomach hurt  ? Banana Swelling and Cough  ?  Inside of the mouth swells  ? Food Other (See Comments) and Cough  ?  Nuts- mucus membranes inflamed/cough ?  ? Iodinated Contrast Media Anaphylaxis  ?  Required epinephrine. Since then, has tolerated with premed. ?Other reaction(s): wheeze...requires premdication with prednisone 3 days in advance  ? Latex Hives and Cough  ?   Wheezing/cough ? ?Other reaction(s): hives/wheeze  ? Meperidine Hcl Other (See Comments)  ?  Projectile vomiting ?  ? Peanut-Containing Drug Products Swelling and Cough  ?  Inside of the mouth swells  ? Penicillins Anaphylaxis  ?  Has patient had a PCN reaction causing immediate rash, facial/tongue/throat swelling, SOB or lightheadedness with hypotension:Yes ?Has patient had a PCN reaction causing severe rash involving mucus membranes or skin necrosis:Yes ?Has patient had a PCN reaction that required hospitalization:Treated in ER w/epi &  breathing treatments ?Has patient had a PCN reaction occurring within the last 10 years:No ?If all of the above answers are "NO", then may proceed with Cephalosporin use. ?  ? Statins Other (See Comments)  ?  Severe leg pain - has tried most of them  ? Sulfonamide Derivatives Anaphylaxis  ? Tape Hives and Dermatitis  ?  No plastic tape!!  ? Alfuzosin Other (See Comments)  ?  Headache, dizziness  ? Halothane   ?  Listed under anesthesia adverse reactions pt does not know what reaction or if any at preop on 05/26/21.   ? Other Other (See Comments)  ?  Vascepa- upset stomach  ? Sulfamethoxazole Hives  ? ? ?No medications prior to admission.  ? ? ?No results found for this or any previous visit (from the past 48 hour(s)). ?No results found. ? ?Review of Systems  ?Musculoskeletal:  Positive for arthralgias.  ?     Right knee  ?All other systems reviewed and are negative. ? ?There were no vitals taken for this visit. ?Physical Exam ?Constitutional:   ?   Appearance: Normal appearance.  ?HENT:  ?   Head: Normocephalic and atraumatic.  ?   Mouth/Throat:  ?   Pharynx: Oropharynx is clear.  ?Eyes:  ?   Extraocular Movements: Extraocular movements intact.  ?Cardiovascular:  ?   Rate and Rhythm: Normal rate.  ?Pulmonary:  ?   Effort: Pulmonary effort is normal.  ?Abdominal:  ?   Palpations: Abdomen is soft.  ?Musculoskeletal:  ?   Cervical back: Normal range of motion.  ?   Comments: Examination  of the right knee shows range of motion from 0-120s of flexion.  She has pain at end flexion.  She has tennis palpation both along the medial and lateral joint lines.  Mild crepitation.  No effusion.  Her ligaments a

## 2021-06-10 ENCOUNTER — Ambulatory Visit (HOSPITAL_COMMUNITY)
Admission: RE | Admit: 2021-06-10 | Discharge: 2021-06-10 | Disposition: A | Discharge status: Home / Self Care | Payer: Medicare Other | Attending: Orthopaedic Surgery | Admitting: Orthopaedic Surgery

## 2021-06-10 ENCOUNTER — Ambulatory Visit (HOSPITAL_COMMUNITY): Payer: Medicare Other | Admitting: Physician Assistant

## 2021-06-10 ENCOUNTER — Encounter (HOSPITAL_COMMUNITY): Payer: Self-pay | Admitting: Orthopaedic Surgery

## 2021-06-10 ENCOUNTER — Ambulatory Visit (HOSPITAL_BASED_OUTPATIENT_CLINIC_OR_DEPARTMENT_OTHER): Payer: Medicare Other | Admitting: Anesthesiology

## 2021-06-10 ENCOUNTER — Encounter (HOSPITAL_COMMUNITY)
Admission: RE | Disposition: A | Discharge status: Home / Self Care | Payer: Self-pay | Source: Home / Self Care | Attending: Orthopaedic Surgery

## 2021-06-10 DIAGNOSIS — W19XXXA Unspecified fall, initial encounter: Secondary | ICD-10-CM | POA: Insufficient documentation

## 2021-06-10 DIAGNOSIS — I10 Essential (primary) hypertension: Secondary | ICD-10-CM | POA: Insufficient documentation

## 2021-06-10 DIAGNOSIS — Z79899 Other long term (current) drug therapy: Secondary | ICD-10-CM | POA: Insufficient documentation

## 2021-06-10 DIAGNOSIS — D682 Hereditary deficiency of other clotting factors: Secondary | ICD-10-CM | POA: Diagnosis not present

## 2021-06-10 DIAGNOSIS — K219 Gastro-esophageal reflux disease without esophagitis: Secondary | ICD-10-CM | POA: Insufficient documentation

## 2021-06-10 DIAGNOSIS — Z86718 Personal history of other venous thrombosis and embolism: Secondary | ICD-10-CM

## 2021-06-10 DIAGNOSIS — S83241A Other tear of medial meniscus, current injury, right knee, initial encounter: Secondary | ICD-10-CM | POA: Diagnosis present

## 2021-06-10 DIAGNOSIS — I480 Paroxysmal atrial fibrillation: Secondary | ICD-10-CM | POA: Diagnosis not present

## 2021-06-10 DIAGNOSIS — Z7901 Long term (current) use of anticoagulants: Secondary | ICD-10-CM | POA: Diagnosis not present

## 2021-06-10 DIAGNOSIS — E119 Type 2 diabetes mellitus without complications: Secondary | ICD-10-CM

## 2021-06-10 DIAGNOSIS — Z6839 Body mass index (BMI) 39.0-39.9, adult: Secondary | ICD-10-CM | POA: Diagnosis not present

## 2021-06-10 DIAGNOSIS — M1711 Unilateral primary osteoarthritis, right knee: Secondary | ICD-10-CM

## 2021-06-10 DIAGNOSIS — M25561 Pain in right knee: Secondary | ICD-10-CM

## 2021-06-10 HISTORY — PX: KNEE ARTHROSCOPY: SHX127

## 2021-06-10 LAB — PROTIME-INR
INR: 0.9 (ref 0.8–1.2)
Prothrombin Time: 12.6 seconds (ref 11.4–15.2)

## 2021-06-10 LAB — GLUCOSE, CAPILLARY: Glucose-Capillary: 204 mg/dL — ABNORMAL HIGH (ref 70–99)

## 2021-06-10 SURGERY — ARTHROSCOPY, KNEE
Anesthesia: General | Site: Knee | Laterality: Right

## 2021-06-10 MED ORDER — VANCOMYCIN HCL IN DEXTROSE 1-5 GM/200ML-% IV SOLN: 1000.0000 mg | INTRAVENOUS | Status: DC

## 2021-06-10 MED ORDER — FENTANYL CITRATE (PF) 100 MCG/2ML IJ SOLN
INTRAMUSCULAR | Status: DC | PRN
  Administered 2021-06-10 (×2): 25 ug via INTRAVENOUS
  Administered 2021-06-10: 50 ug via INTRAVENOUS

## 2021-06-10 MED ORDER — PROPOFOL 10 MG/ML IV BOLUS
INTRAVENOUS | Status: DC | PRN
  Administered 2021-06-10: 200 mg via INTRAVENOUS

## 2021-06-10 MED ORDER — SODIUM CHLORIDE 0.9 % IR SOLN
Status: DC | PRN
Start: 2021-06-10 — End: 2021-06-10
  Administered 2021-06-10: 3000 mL

## 2021-06-10 MED ORDER — OXYCODONE HCL 5 MG PO TABS
ORAL_TABLET | ORAL | Status: AC
  Filled 2021-06-10: qty 1

## 2021-06-10 MED ORDER — OXYCODONE HCL 5 MG/5ML PO SOLN: 5.0000 mg | Freq: Once | ORAL | Status: AC | PRN

## 2021-06-10 MED ORDER — EPINEPHRINE PF 1 MG/ML IJ SOLN
INTRAMUSCULAR | Status: AC
  Filled 2021-06-10: qty 1

## 2021-06-10 MED ORDER — HYDROMORPHONE HCL 2 MG PO TABS
2.0000 mg | ORAL_TABLET | Freq: Once | ORAL | Status: AC
  Administered 2021-06-10: 2 mg via ORAL

## 2021-06-10 MED ORDER — LACTATED RINGERS IV SOLN
INTRAVENOUS | Status: DC
Start: 2021-06-10 — End: 2021-06-10

## 2021-06-10 MED ORDER — LIDOCAINE 2% (20 MG/ML) 5 ML SYRINGE
INTRAMUSCULAR | Status: DC | PRN
  Administered 2021-06-10: 60 mg via INTRAVENOUS

## 2021-06-10 MED ORDER — ONDANSETRON HCL 4 MG/2ML IJ SOLN
INTRAMUSCULAR | Status: DC | PRN
  Administered 2021-06-10: 4 mg via INTRAVENOUS

## 2021-06-10 MED ORDER — VANCOMYCIN HCL 1500 MG/300ML IV SOLN
1500.0000 mg | Freq: Once | INTRAVENOUS | Status: AC
  Administered 2021-06-10: 1500 mg via INTRAVENOUS
  Filled 2021-06-10: qty 300

## 2021-06-10 MED ORDER — MIDAZOLAM HCL 2 MG/2ML IJ SOLN
INTRAMUSCULAR | Status: AC
  Filled 2021-06-10: qty 2

## 2021-06-10 MED ORDER — ORAL CARE MOUTH RINSE: 15.0000 mL | Freq: Once | OROMUCOSAL | Status: AC

## 2021-06-10 MED ORDER — MIDAZOLAM HCL 2 MG/2ML IJ SOLN
1.0000 mg | INTRAMUSCULAR | Status: DC
  Administered 2021-06-10: 1 mg via INTRAVENOUS
  Filled 2021-06-10: qty 2

## 2021-06-10 MED ORDER — EPHEDRINE 5 MG/ML INJ
INTRAVENOUS | Status: AC
  Filled 2021-06-10: qty 5

## 2021-06-10 MED ORDER — EPINEPHRINE (ANAPHYLAXIS) 1 MG/ML IJ SOLN
INTRAMUSCULAR | Status: DC | PRN
Start: 2021-06-10 — End: 2021-06-10
  Administered 2021-06-10: 1 mg

## 2021-06-10 MED ORDER — ROPIVACAINE HCL 5 MG/ML IJ SOLN
INTRAMUSCULAR | Status: DC | PRN
  Administered 2021-06-10: 20 mL via PERINEURAL

## 2021-06-10 MED ORDER — EPHEDRINE SULFATE (PRESSORS) 50 MG/ML IJ SOLN
INTRAMUSCULAR | Status: DC | PRN
  Administered 2021-06-10: 5 mg via INTRAVENOUS
  Administered 2021-06-10 (×2): 7.5 mg via INTRAVENOUS

## 2021-06-10 MED ORDER — FENTANYL CITRATE PF 50 MCG/ML IJ SOSY
PREFILLED_SYRINGE | INTRAMUSCULAR | Status: AC
  Filled 2021-06-10: qty 1

## 2021-06-10 MED ORDER — OXYCODONE HCL 5 MG PO TABS
5.0000 mg | ORAL_TABLET | Freq: Once | ORAL | Status: AC | PRN
  Administered 2021-06-10: 5 mg via ORAL

## 2021-06-10 MED ORDER — DROPERIDOL 2.5 MG/ML IJ SOLN
INTRAMUSCULAR | Status: DC | PRN
  Administered 2021-06-10: .625 mg via INTRAVENOUS

## 2021-06-10 MED ORDER — FENTANYL CITRATE (PF) 100 MCG/2ML IJ SOLN
INTRAMUSCULAR | Status: AC
  Filled 2021-06-10: qty 2

## 2021-06-10 MED ORDER — LACTATED RINGERS IV SOLN: INTRAVENOUS | Status: DC

## 2021-06-10 MED ORDER — CHLORHEXIDINE GLUCONATE 0.12 % MT SOLN
15.0000 mL | Freq: Once | OROMUCOSAL | Status: AC
  Administered 2021-06-10: 15 mL via OROMUCOSAL

## 2021-06-10 MED ORDER — DROPERIDOL 2.5 MG/ML IJ SOLN
INTRAMUSCULAR | Status: AC
  Filled 2021-06-10: qty 2

## 2021-06-10 MED ORDER — ONDANSETRON HCL 4 MG/2ML IJ SOLN: 4.0000 mg | Freq: Once | INTRAMUSCULAR | Status: DC | PRN

## 2021-06-10 MED ORDER — DEXAMETHASONE SODIUM PHOSPHATE 4 MG/ML IJ SOLN
INTRAMUSCULAR | Status: DC | PRN
  Administered 2021-06-10: 10 mg via INTRAVENOUS

## 2021-06-10 MED ORDER — HYDROMORPHONE HCL 2 MG PO TABS
ORAL_TABLET | ORAL | Status: AC
  Filled 2021-06-10: qty 1

## 2021-06-10 MED ORDER — ACETAMINOPHEN 10 MG/ML IV SOLN: 1000.0000 mg | Freq: Once | INTRAVENOUS | Status: DC | PRN

## 2021-06-10 MED ORDER — FENTANYL CITRATE PF 50 MCG/ML IJ SOSY
25.0000 ug | PREFILLED_SYRINGE | INTRAMUSCULAR | Status: DC | PRN
  Administered 2021-06-10 (×2): 50 ug via INTRAVENOUS

## 2021-06-10 MED ORDER — MIDAZOLAM HCL 5 MG/5ML IJ SOLN
INTRAMUSCULAR | Status: DC | PRN
  Administered 2021-06-10: 2 mg via INTRAVENOUS

## 2021-06-10 MED ORDER — FENTANYL CITRATE PF 50 MCG/ML IJ SOSY
50.0000 ug | PREFILLED_SYRINGE | INTRAMUSCULAR | Status: DC
  Administered 2021-06-10: 50 ug via INTRAVENOUS
  Filled 2021-06-10: qty 2

## 2021-06-10 SURGICAL SUPPLY — 35 items
BAG COUNTER SPONGE SURGICOUNT (BAG) ×2 IMPLANT
BLADE EXCALIBUR 4.0X13 (MISCELLANEOUS) IMPLANT
BNDG ELASTIC 6X5.8 VLCR STR LF (GAUZE/BANDAGES/DRESSINGS) ×2 IMPLANT
BNDG GAUZE ELAST 4 BULKY (GAUZE/BANDAGES/DRESSINGS) ×2 IMPLANT
BONE TUNNEL PLUG CANNULATED (MISCELLANEOUS) ×2 IMPLANT
BURR OVAL 8 FLU 4.0X13 (MISCELLANEOUS) ×2 IMPLANT
COVER SURGICAL LIGHT HANDLE (MISCELLANEOUS) IMPLANT
DISSECTOR 3.5MM X 13CM (MISCELLANEOUS) ×2 IMPLANT
DRAPE ARTHROSCOPY W/POUCH 114 (DRAPES) ×2 IMPLANT
DRAPE SHEET LG 3/4 BI-LAMINATE (DRAPES) ×2 IMPLANT
DRAPE U-SHAPE 47X51 STRL (DRAPES) ×2 IMPLANT
DRSG EMULSION OIL 3X3 NADH (GAUZE/BANDAGES/DRESSINGS) ×2 IMPLANT
DRSG PAD ABDOMINAL 8X10 ST (GAUZE/BANDAGES/DRESSINGS) ×3 IMPLANT
DURAPREP 26ML APPLICATOR (WOUND CARE) ×4 IMPLANT
GAUZE 4X4 16PLY ~~LOC~~+RFID DBL (SPONGE) ×2 IMPLANT
GAUZE SPONGE 4X4 12PLY STRL (GAUZE/BANDAGES/DRESSINGS) ×2 IMPLANT
GLOVE SRG 8 PF TXTR STRL LF DI (GLOVE) ×2 IMPLANT
GLOVE SURG ENC MOIS LTX SZ8 (GLOVE) ×4 IMPLANT
GLOVE SURG UNDER POLY LF SZ8 (GLOVE) ×2
GOWN STRL REUS W/ TWL XL LVL3 (GOWN DISPOSABLE) ×2 IMPLANT
GOWN STRL REUS W/TWL XL LVL3 (GOWN DISPOSABLE) ×2
KIT BASIN OR (CUSTOM PROCEDURE TRAY) ×2 IMPLANT
MANIFOLD NEPTUNE II (INSTRUMENTS) ×2 IMPLANT
NDL SPNL 18GX3.5 QUINCKE PK (NEEDLE) IMPLANT
NEEDLE HYPO 22GX1.5 SAFETY (NEEDLE) ×2 IMPLANT
NEEDLE SPNL 18GX3.5 QUINCKE PK (NEEDLE) IMPLANT
PACK ARTHROSCOPY WL (CUSTOM PROCEDURE TRAY) ×2 IMPLANT
PAD ARMBOARD 7.5X6 YLW CONV (MISCELLANEOUS) ×4 IMPLANT
PADDING CAST ABS 6INX4YD NS (CAST SUPPLIES) ×1
PADDING CAST ABS COTTON 6X4 NS (CAST SUPPLIES) IMPLANT
PENCIL SMOKE EVACUATOR (MISCELLANEOUS) IMPLANT
SYR CONTROL 10ML LL (SYRINGE) ×2 IMPLANT
TOWEL OR 17X26 10 PK STRL BLUE (TOWEL DISPOSABLE) ×2 IMPLANT
TUBING ARTHROSCOPY IRRIG 16FT (MISCELLANEOUS) ×2 IMPLANT
WATER STERILE IRR 1000ML POUR (IV SOLUTION) ×2 IMPLANT

## 2021-06-10 NOTE — Anesthesia Postprocedure Evaluation (Signed)
Anesthesia Post Note ? ?Patient: [name] ? ?Procedure(s) Performed: RIGHT KNEE ARTHROSCOPY (Right: Knee) ? ?  ? ?Patient location during evaluation: PACU ?Anesthesia Type: General ?Level of consciousness: awake and alert ?Pain management: pain level controlled ?Vital Signs Assessment: post-procedure vital signs reviewed and stable ?Respiratory status: spontaneous breathing, nonlabored ventilation, respiratory function stable and patient connected to nasal cannula oxygen ?Cardiovascular status: blood pressure returned to baseline and stable ?Postop Assessment: no apparent nausea or vomiting ?Anesthetic complications: no ? ? ?No notable events documented. ? ?Last Vitals:  ?Vitals:  ? 06/10/21 1130 06/10/21 1145  ?BP: (!) 154/72 (!) 146/79  ?Pulse: 75 72  ?Resp: 13 15  ?Temp:    ?SpO2: 95% 98%  ?  ?Last Pain:  ?Vitals:  ? 06/10/21 1159  ?TempSrc:   ?PainSc: 5   ? ? ?  ?  ?  ?  ?  ?  ? ?Jerred Zaremba S ? ? ? ? ?

## 2021-06-10 NOTE — Interval H&P Note (Signed)
History and Physical Interval Note: ? ?06/10/2021 ?9:47 AM ? ?[name]  has presented today for surgery, with the diagnosis of RIGHT KNEE MEDIAL MENISCUS TEAR AND LATERAL OSTEOCHONDRAL DEFECT.  The various methods of treatment have been discussed with the patient and family. After consideration of risks, benefits and other options for treatment, the patient has consented to  Procedure(s): ?RIGHT KNEE ARTHROSCOPY (Right) as a surgical intervention.  The patient's history has been reviewed, patient examined, no change in status, stable for surgery.  I have reviewed the patient's chart and labs.  Questions were answered to the patient's satisfaction.   ? ? ?Monico Blitz Francille Wittmann ? ? ?

## 2021-06-10 NOTE — Brief Op Note (Signed)
Weston Brass Asfour ?[id number] ?06/10/2021 ? ? ?PRE-OP DIAGNOSIS: right knee TMM and DJD ? ?POST-OP DIAGNOSIS: same ? ?PROCEDURE: right knee scope PMM ? ?ANESTHESIA: general ? ?Hessie Dibble ? ? ?Dictation #:  [id number]  ?

## 2021-06-10 NOTE — Anesthesia Procedure Notes (Signed)
Anesthesia Procedure Image    

## 2021-06-10 NOTE — Anesthesia Procedure Notes (Signed)
Procedure Name: LMA Insertion ?Date/Time: 06/10/2021 10:52 AM ?Performed by: Lavina Hamman, CRNA ?Pre-anesthesia Checklist: Patient identified, Emergency Drugs available, Suction available, Patient being monitored and Timeout performed ?Patient Re-evaluated:Patient Re-evaluated prior to induction ?Oxygen Delivery Method: Circle system utilized ?Preoxygenation: Pre-oxygenation with 100% oxygen ?Induction Type: IV induction ?Ventilation: Mask ventilation without difficulty ?LMA: LMA inserted ?Tube size: 4.0 mm ?Number of attempts: 1 ?Placement Confirmation: positive ETCO2 and breath sounds checked- equal and bilateral ?Tube secured with: Tape ?Dental Injury: Teeth and Oropharynx as per pre-operative assessment  ? ? ? ? ?

## 2021-06-10 NOTE — Transfer of Care (Signed)
Immediate Anesthesia Transfer of Care Note ? ?Patient: HANNA RA ? ?Procedure(s) Performed: Procedure(s): ?RIGHT KNEE ARTHROSCOPY (Right) ? ?Patient Location: PACU ? ?Anesthesia Type:General ? ?Level of Consciousness:  sedated, patient cooperative and responds to stimulation ? ?Airway & Oxygen Therapy:Patient Spontanous Breathing and Patient connected to face mask oxgen ? ?Post-op Assessment:  Report given to PACU RN and Post -op Vital signs reviewed and stable ? ?Post vital signs:  Reviewed and stable ? ?Last Vitals:  ?Vitals:  ? 06/10/21 0942 06/10/21 1125  ?BP: (!) 160/92 (!) 152/74  ?Pulse: 72 84  ?Resp: 18 13  ?Temp:  (!) 36.4 ?C  ?SpO2: 93% 98%  ? ? ?Complications: No apparent anesthesia complications ? ?

## 2021-06-10 NOTE — Op Note (Signed)
NAME: [name], Kelly L. ?MEDICAL RECORD NO: [id number] ?ACCOUNT NO: [id number] ?DATE OF BIRTH: 09-05-1955 ?FACILITY: [facility] ?LOCATION: [facility] ?PHYSICIAN: Monico Blitz. Rhona Raider, MD ? ?Operative Report  ? ?DATE OF PROCEDURE: 06/10/2021 ? ?PREOPERATIVE DIAGNOSES:   ?1.  Right knee torn medial meniscus. ?2.  Right knee degenerative joint disease. ? ?POSTOPERATIVE DIAGNOSES: ?1.  Right knee torn medial meniscus. ?2.  Right knee degenerative joint disease. ? ?PROCEDURES:   ?1.  Right knee partial medial meniscectomy. ?2.  Right knee abrasion chondroplasty lateral and patellofemoral. ? ?ANESTHESIA:  General and block. ? ?ATTENDING SURGEON:  Monico Blitz. Rhona Raider, MD. ? ?ASSISTANT:  Loni Dolly, PA. ? ?INDICATIONS FOR PROCEDURE:  The patient is a woman in her 48s, who has struggled from some degenerative arthritis and acute knee pain.  She has persisted with pain, especially over the last several months involving the right knee.  MRI scan has shown  ?some chronic degenerative change, but also an acute torn medial meniscus.  She has failed some conservative measures and has pain, which limits her ability to rest and walk.  She is offered an arthroscopy.  Informed operative consent was obtained after  ?discussion of possible complications including, reaction to anesthesia and infection. ? ?DESCRIPTION OF PROCEDURE:  Under general anesthesia and a block a right knee arthroscopy was performed.  Suprapatellar pouch was benign while the patellofemoral joint exhibited some grade III and focal grade IV change, especially through the  ?intertrochlear groove.  A thorough chondroplasty was done along with abrasion of bleeding bone in some small areas.  In the medial compartment, she had an undersurface tear of the medial meniscus consistent with her scan.  This was in the mid body.  This ? required about a 10% partial medial meniscectomy back to stable tissues.  This was done with basket and shaver.  She had some mild degenerative change medial  compartment, no exposed bone.  ACL looked normal and the lateral compartment had intact lateral ? meniscus with a dime size area of bare bone with some surrounding loose flaps of articular cartilage.  I performed again chondroplasty back to stable tissues with abrasion of bleeding bone.  She was scheduled to go home same day. ? ?DESCRIPTION OF PROCEDURE:  The patient was taken to the operating suite where general anesthetic was applied without difficulty.  She was also given a block in the preanesthesia area.  She was positioned supine and prepped and draped in normal sterile  ?fashion.  After the administration of preoperative IV vancomycin and appropriate time-out an arthroscopy of the right knee was performed through a total of 2 portals.  Findings were as noted above and procedure consisted predominantly of the partial  ?medial meniscectomy, which was done with basket and shaver.  This was followed by the abrasion chondroplasty, as described above.  The knee was thoroughly irrigated, followed by placement of Marcaine with epinephrine and morphine.  Adaptic was placed  ?over the portals followed by dry gauze and a loose Ace wrap.  Estimated blood loss and intraoperative fluids can be obtained from anesthesia records. ? ?DISPOSITION:  The patient was extubated in the operating room and taken to recovery room in stable condition.  Plans were for her to go home same day and follow up in the office in less than week.  I will contact her by phone tonight. ? ? ?PUS ?D: 06/10/2021 11:25:03 am T: 06/10/2021 3:12:00 pm  ?JOB: [id number]/ [id number]  ?

## 2021-06-10 NOTE — Anesthesia Procedure Notes (Signed)
Anesthesia Regional Block: Adductor canal block  ? ?Pre-Anesthetic Checklist: , timeout performed,  Correct Patient, Correct Site, Correct Laterality,  Correct Procedure, Correct Position, site marked,  Risks and benefits discussed,  Surgical consent,  Pre-op evaluation,  At surgeon's request and post-op pain management ? ?Laterality: Right ? ?Prep: chloraprep     ?  ?Needles:  ?Injection technique: Single-shot ? ?Needle Type: Echogenic Needle   ? ? ?Needle Length: 9cm  ? ? ? ? ?Additional Needles: ? ? ?Procedures:,,,, ultrasound used (permanent image in chart),,    ?Narrative:  ?Start time: 06/10/2021 9:37 AM ?End time: 06/10/2021 9:42 AM ?Injection made incrementally with aspirations every 5 mL. ? ?Performed by: Personally  ?Anesthesiologist: Myrtie Soman, MD ? ?Additional Notes: ?Patient tolerated the procedure well without complications ? ? ? ?

## 2021-06-11 ENCOUNTER — Encounter (HOSPITAL_COMMUNITY): Payer: Self-pay | Admitting: Orthopaedic Surgery

## 2021-06-16 ENCOUNTER — Other Ambulatory Visit: Payer: Self-pay

## 2021-06-16 ENCOUNTER — Ambulatory Visit (INDEPENDENT_AMBULATORY_CARE_PROVIDER_SITE_OTHER): Payer: Medicare Other

## 2021-06-16 DIAGNOSIS — Z5181 Encounter for therapeutic drug level monitoring: Secondary | ICD-10-CM

## 2021-06-16 DIAGNOSIS — I480 Paroxysmal atrial fibrillation: Secondary | ICD-10-CM

## 2021-06-16 DIAGNOSIS — I4891 Unspecified atrial fibrillation: Secondary | ICD-10-CM | POA: Diagnosis not present

## 2021-06-16 DIAGNOSIS — R1032 Left lower quadrant pain: Secondary | ICD-10-CM | POA: Diagnosis not present

## 2021-06-16 DIAGNOSIS — Z01810 Encounter for preprocedural cardiovascular examination: Secondary | ICD-10-CM

## 2021-06-16 LAB — POCT INR: INR: 1.9 — AB (ref 2.0–3.0)

## 2021-06-16 NOTE — Patient Instructions (Signed)
-   take Lovenox tonight, then STOP the injections ?- take 6 mg warfarin tonight, then ?-  Continue taking Warfarin '4mg'$  daily except '6mg'$  on Tuesdays, Thursdays, and Saturdays. .  ?- Recheck INR in 6 weeks ?Call [facility] [phone] Main # [phone] with any questions  ?

## 2021-07-29 ENCOUNTER — Ambulatory Visit (INDEPENDENT_AMBULATORY_CARE_PROVIDER_SITE_OTHER): Payer: Medicare Other

## 2021-07-29 DIAGNOSIS — Z01810 Encounter for preprocedural cardiovascular examination: Secondary | ICD-10-CM

## 2021-07-29 DIAGNOSIS — Z5181 Encounter for therapeutic drug level monitoring: Secondary | ICD-10-CM | POA: Diagnosis not present

## 2021-07-29 DIAGNOSIS — I4891 Unspecified atrial fibrillation: Secondary | ICD-10-CM

## 2021-07-29 DIAGNOSIS — I480 Paroxysmal atrial fibrillation: Secondary | ICD-10-CM

## 2021-07-29 LAB — POCT INR: INR: 3.4 — AB (ref 2.0–3.0)

## 2021-07-29 NOTE — Patient Instructions (Addendum)
Description   ?Skip today's dosage of Warfarin, then resume same dosage of Warfarin '4mg'$  daily except '6mg'$  on Tuesdays, Thursdays, and Saturdays. Recheck INR in 4 weeks (usually 6 week recheck).  Call Coumadin Clinic [phone] Main # [phone] with any questions  ?  ?  ?  ?

## 2021-08-01 ENCOUNTER — Encounter: Payer: Self-pay | Admitting: Podiatry

## 2021-08-01 ENCOUNTER — Ambulatory Visit (INDEPENDENT_AMBULATORY_CARE_PROVIDER_SITE_OTHER): Payer: Medicare Other | Admitting: Podiatry

## 2021-08-01 DIAGNOSIS — M79674 Pain in right toe(s): Secondary | ICD-10-CM

## 2021-08-01 DIAGNOSIS — E1169 Type 2 diabetes mellitus with other specified complication: Secondary | ICD-10-CM

## 2021-08-01 DIAGNOSIS — D689 Coagulation defect, unspecified: Secondary | ICD-10-CM | POA: Diagnosis not present

## 2021-08-01 DIAGNOSIS — B351 Tinea unguium: Secondary | ICD-10-CM | POA: Diagnosis not present

## 2021-08-01 DIAGNOSIS — M79675 Pain in left toe(s): Secondary | ICD-10-CM | POA: Diagnosis not present

## 2021-08-01 NOTE — Progress Notes (Signed)
This patient returns to my office for at risk foot care.  This patient requires this care by a professional since this patient will be at risk due to having coagulation defect.  Patient is taking coumadin.  This patient is unable to cut nails herself since the patient cannot reach her nails.These nails are painful walking and wearing shoes.  This patient presents for at risk foot care today.  General Appearance  Alert, conversant and in no acute stress.  Vascular  Dorsalis pedis and posterior tibial  pulses are palpable  bilaterally.  Capillary return is within normal limits  bilaterally. Temperature is within normal limits  bilaterally.  Neurologic  Senn-Weinstein monofilament wire test within normal limits  bilaterally. Muscle power within normal limits bilaterally.  Nails Thick disfigured discolored nails with subungual debris  Second left and first and third right foot.  No evidence of bacterial infection or drainage bilaterally.  Orthopedic  No limitations of motion  feet .  No crepitus or effusions noted.  No bony pathology or digital deformities noted.  Skin  normotropic skin with no porokeratosis noted bilaterally.  No signs of infections or ulcers noted.     Onychomycosis  Pain in right toes  Pain in left toes  Consent was obtained for treatment procedures.   Mechanical debridement of nails 1-5  bilaterally performed with a nail nipper.  Filed with dremel without incident.    Return office visit   9 weeks                  Told patient to return for periodic foot care and evaluation due to potential at risk complications.   Ahmon Tosi DPM  

## 2021-08-22 ENCOUNTER — Ambulatory Visit: Payer: Medicare Other | Admitting: Gastroenterology

## 2021-09-02 ENCOUNTER — Ambulatory Visit (INDEPENDENT_AMBULATORY_CARE_PROVIDER_SITE_OTHER): Payer: Medicare Other | Admitting: *Deleted

## 2021-09-02 DIAGNOSIS — I480 Paroxysmal atrial fibrillation: Secondary | ICD-10-CM | POA: Diagnosis not present

## 2021-09-02 DIAGNOSIS — I4891 Unspecified atrial fibrillation: Secondary | ICD-10-CM

## 2021-09-02 DIAGNOSIS — Z5181 Encounter for therapeutic drug level monitoring: Secondary | ICD-10-CM

## 2021-09-02 LAB — POCT INR: INR: 1.9 — AB (ref 2.0–3.0)

## 2021-09-02 NOTE — Patient Instructions (Addendum)
Description   Today take '8mg'$  (4 tablets) of Warfarin, then continue taking Warfarin '4mg'$  daily except '6mg'$  on Tuesdays, Thursdays, and Saturdays. Recheck INR in 4 weeks (usually 6 weeks recheck).  Call Coumadin Clinic [phone] Main # [phone] with any questions

## 2021-09-09 ENCOUNTER — Ambulatory Visit (INDEPENDENT_AMBULATORY_CARE_PROVIDER_SITE_OTHER): Payer: Medicare Other | Admitting: Cardiology

## 2021-09-09 ENCOUNTER — Encounter: Payer: Self-pay | Admitting: Cardiology

## 2021-09-09 VITALS — BP 140/70 | HR 80 | Ht 64.0 in | Wt 230.0 lb

## 2021-09-09 DIAGNOSIS — I480 Paroxysmal atrial fibrillation: Secondary | ICD-10-CM

## 2021-09-09 DIAGNOSIS — I10 Essential (primary) hypertension: Secondary | ICD-10-CM | POA: Diagnosis not present

## 2021-09-09 DIAGNOSIS — G4733 Obstructive sleep apnea (adult) (pediatric): Secondary | ICD-10-CM

## 2021-09-09 NOTE — Progress Notes (Signed)
Electrophysiology Office Note:    Date:  09/09/2021   ID:  [name], DOB [birth year], MRN [id number]  PCP:  Antony Contras, MD  [facility] Cardiologist:  Buford Dresser, MD  [facility] Electrophysiologist:  Vickie Epley, MD   Referring MD: Antony Contras, MD   Chief Complaint: Follow-up  History of Present Illness:    [name] is a 66 y.o. female who presents for follow-up. Their medical history includes atrial fibrillation, atrial flutter, non-obstructive CAD, anginal pain, DVT, hypertension, hyperlipidemia, rheumatoid arthritis, GERD, GI bleed, obesity, and OSA. She had prior ablations in 2010 and 04/2016. Previously followed by Dr. Rayann Heman.  They were most recently seen in cardiology by Richardson Dopp, PA-C on 05/27/2021. She had been admitted in 03/2021 for chest pain. She reported no further chest pain since starting isosorbide. It was thought she might have microvascular ischemia. She was maintaining sinus rhythm, no longer on antiarrhythmic drug therapy.  Today, she states she is feeling fair. At times she has palpitations, pretty regularly. She attributes some of her increased palpitations to the recent death of her father. Offered my condolences.   There have been no recurrent chest pains.  She stays active with gardening. She is able to complete her ADL's even if she needs to rest occasionally.  Her INR was reportedly 1.9 last week, but normally controlled.  She denies any peripheral edema. No lightheadedness, headaches, syncope, orthopnea, or PND.     Past Medical History:  Diagnosis Date   Allergic rhinitis    Anginal pain ([place])    Arthritis    "back" (05/07/2016)   Asthma    Atrial flutter ([place])    a. Remotely per notes.   Carpal tunnel syndrome, bilateral    Colon polyps    Diverticulitis    Dizziness    DVT (deep venous thrombosis) ([place]) 1980s x2 -    between birth of two sons. Saw hematology - was told she has a hypercoagulable  disorder, should be on Coumadin lifelong.   Dysrhythmia    atrial fib   Family history of adverse reaction to anesthesia    "mother gets PONV"   Gallbladder disease    GERD (gastroesophageal reflux disease)    GIB (gastrointestinal bleeding) 04/14/2018   Halothane adverse reaction    narrow small opening   Heart murmur    Hiatal hernia    History of hiatal hernia    "small one/CTA 05/01/2016" (05/07/2016)   History of kidney stones    HTN (hypertension)    Hypercholesteremia    hx; "brought it down w/diet" (05/07/2016)   Hyperglycemia    a. A1c 6.1 in 2014.   Inguinal hernia    Iron deficiency anemia due to chronic blood loss 04/14/2018   Iron malabsorption 04/14/2018   Left sided sciatica    Normal cardiac stress test 06/2012   Obesity    OSA (obstructive sleep apnea)    a. Mild, did not tolerate CPAP (05/07/2016)   PAF (paroxysmal atrial fibrillation) ([place])    a. s/p afib ablation at [facility] 2010 (Dr. Annabell Howells). b. Recurrent AF s/p DCCV 06/2010. c. On flecainide.    Paroxysmal atrial fibrillation ([place]) 09/09/2009   Qualifier: Diagnosis of  By: Selena Batten CMA, [place]     PONV (postoperative nausea and vomiting)    PPD positive, treated 1977   "treated for 1 yr after exposure to patient"   Pre-diabetes    RSV infection ~ 2006   Vertigo    Wandering (  atrial) pacemaker    a. Remotely per notes.  (  pt. states has a wandering p wave ) no pacemaker    Past Surgical History:  Procedure Laterality Date   ANKLE ARTHODESIS W/ ARTHROSCOPY Right 11/12/2020   ATRIAL ABLATION SURGERY  05/07/2016   "fib and flutter"   ATRIAL FIBRILLATION ABLATION  2010   at [place] N/A 05/07/2016   Procedure: Atrial Fibrillation Ablation;  Surgeon: Thompson Grayer, MD;  Location: [facility];  Service: Cardiovascular;  Laterality: N/A;   BREAST CYST ASPIRATION Bilateral    CARDIAC CATHETERIZATION  2008   CARPAL TUNNEL RELEASE Bilateral    CATARACT EXTRACTION  Bilateral 2022   CESAREAN SECTION  1986; 1988   COLON RESECTION     COLONOSCOPY  X 2   COLONOSCOPY W/ BIOPSIES AND POLYPECTOMY  X 2   CYSTOSCOPY/URETEROSCOPY/HOLMIUM LASER/STENT PLACEMENT Left 12/23/2019   Procedure: CYSTOSCOPY/URETEROSCOPY/HOLMIUM LASER/STENT PLACEMENT;  Surgeon: Robley Fries, MD;  Location: [facility];  Service: Urology;  Laterality: Left;   ESOPHAGOGASTRODUODENOSCOPY     ESOPHAGOGASTRODUODENOSCOPY (EGD) WITH PROPOFOL N/A 07/19/2017   Procedure: ESOPHAGOGASTRODUODENOSCOPY (EGD) WITH PROPOFOL;  Surgeon: Yetta Flock, MD;  Location: [facility];  Service: Gastroenterology;  Laterality: N/A;   INGUINAL HERNIA REPAIR Right    KNEE ARTHROSCOPY Right 06/10/2021   Procedure: RIGHT KNEE ARTHROSCOPY;  Surgeon: Melrose Nakayama, MD;  Location: [facility];  Service: Orthopedics;  Laterality: Right;   [place]   POLYPECTOMY N/A 07/19/2017   Procedure: POLYPECTOMY;  Surgeon: Yetta Flock, MD;  Location: [facility];  Service: Gastroenterology;  Laterality: N/A;   PROCTOSCOPY N/A 08/06/2017   Procedure: PROCTOSCOPY;  Surgeon: Michael Boston, MD;  Location: [facility];  Service: General;  Laterality: N/A;   TMJ ARTHROPLASTY     TUBAL LIGATION  1988    Current Medications: Current Meds  Medication Sig   acetaminophen (TYLENOL) 650 MG CR tablet Take 1,300 mg by mouth every 8 (eight) hours as needed for pain.    cetirizine (ZYRTEC) 10 MG tablet Take 10 mg by mouth daily.   Cholecalciferol (VITAMIN D3) 1000 UNITS CAPS Take 2,000 Units by mouth daily.   dicyclomine (BENTYL) 10 MG capsule TAKE 1 CAPSULE BY MOUTH 30 MINS BEFORE MEALS AND AT BEDTIME AS NEEDED FOR PAIN/CRAMPING   enoxaparin (LOVENOX) 100 MG/ML injection Inject 1 mL (100 mg total) into the skin every 12 (twelve) hours. As instructed by Anticoagulation Clinic   ezetimibe (ZETIA) 10 MG tablet TAKE 1 TABLET BY MOUTH EVERY DAY   FLOVENT HFA 220 MCG/ACT inhaler Inhale 1 puff into the lungs 2  (two) times daily.   fluticasone (FLONASE) 50 MCG/ACT nasal spray Place 1 spray into both nostrils 2 (two) times daily.    isosorbide mononitrate (IMDUR) 30 MG 24 hr tablet Take 1 tablet (30 mg total) by mouth daily.   losartan (COZAAR) 25 MG tablet Take 1 tablet (25 mg total) by mouth daily.   meclizine (ANTIVERT) 25 MG tablet Take 1 tablet (25 mg total) by mouth 3 (three) times daily as needed for dizziness.   Multiple Vitamin (MULTIVITAMIN WITH MINERALS) TABS tablet Take 1 tablet by mouth daily.   nitroGLYCERIN (NITROSTAT) 0.4 MG SL tablet PLACE 1 TABLET UNDER THE TONGUE EVERY 5 MINUTES AS NEEDED.   omeprazole (PRILOSEC) 40 MG capsule Take 1 capsule (40 mg total) by mouth 2 (two) times daily.   polyethylene glycol (MIRALAX) 17 g packet Take 17 g by mouth 2 (two)  times daily. Titrate as needed   PROAIR HFA 108 (90 BASE) MCG/ACT inhaler Inhale 2 puffs into the lungs every 6 (six) hours as needed for wheezing or shortness of breath.    venlafaxine XR (EFFEXOR-XR) 37.5 MG 24 hr capsule Take 37.5 mg by mouth daily with breakfast.    verapamil (CALAN) 80 MG tablet Take 160 mg by mouth 2 (two) times daily.   warfarin (COUMADIN) 2 MG tablet Take '4mg'$  daily except '6mg'$  on Tuesdays, Thursdays, and Saturdays or as [place].     Allergies:   Aspirin, Banana, Food, Iodinated contrast media, Latex, Meperidine hcl, Peanut-containing drug products, Penicillins, Statins, Sulfonamide derivatives, Tape, Alfuzosin, Halothane, Other, and Sulfamethoxazole   Social History   Socioeconomic History   Marital status: Married    Spouse name: Dominica Severin   Number of children: 2   Years of education: Not on file   Highest education level: Master's degree (e.g., MA, MS, MEng, MEd, MSW, MBA)  Occupational History   Occupation: Programmer, multimedia: [employer]  Tobacco Use   Smoking status: Never    Passive exposure: Past   Smokeless tobacco: Never  Vaping Use   Vaping Use: Never used   Substance and Sexual Activity   Alcohol use: Not Currently    Comment: rare   Drug use: No   Sexual activity: Not Currently  Other Topics Concern   Not on file  Social History Narrative   Lives with husband   Social Determinants of Health   Financial Resource Strain: Not on file  Food Insecurity: Not on file  Transportation Needs: Not on file  Physical Activity: Not on file  Stress: Not on file  Social Connections: Not on file     Family History: The patient's family history includes Colon polyps in her father; Diabetes in her maternal grandmother; Heart attack in her maternal grandmother, mother, and paternal grandfather; Hyperlipidemia in her father and maternal grandmother; Hypertension in her maternal grandfather, maternal grandmother, and mother; Lupus in her sister; Stroke in her maternal grandfather, mother, and paternal grandmother; Thyroid disease in her mother and sister. There is no history of Colon cancer or Stomach cancer.  ROS:   Please see the history of present illness.    (+) Palpitations All other systems reviewed and are negative.  EKGs/Labs/Other Studies Reviewed:    The following studies were reviewed today:  04/09/2021  Coronary CT: FINDINGS: A 100 kV prospective scan was triggered in the descending thoracic aorta at 111 HU's. Axial non-contrast 3 mm slices were carried out through the heart. The data set was analyzed on a dedicated work station and scored using the [place]. Gantry rotation speed was 250 msecs and collimation was .6 mm. No beta blockade and 0.8 mg of sl NTG was given. The 3D data set was reconstructed in 5% intervals of the 67-82 % of the R-R cycle. Diastolic phases were analyzed on a dedicated work station using MPR, MIP and VRT modes. The patient received 80 cc of contrast.   Study Quality - Misregistration Artifact.   Aorta: Normal size.  No calcifications.  No dissection.   Aortic Valve:  Trileaflet.  No  calcifications.   Coronary Arteries:  Normal coronary origin.  Right dominance.   RCA is a large dominant artery that gives rise to PDA and PLA. There is no plaque. There is misregistration artifact in the mid portion of the RCA.   Left main is a large artery that gives  rise to LAD and LCX arteries.   LAD is a large vessel. There is mild (25-49%) calcified plaque in the proximal LAD. The mid and distal LAD with no plaques.   LCX is a non-dominant artery that gives rise to one large OM1 branch. There is no plaque.   Coronary Calcium Score:   Left main: 0   Left anterior descending artery: 68   Left circumflex artery: 0   Right coronary artery: 0   Total: 68   Percentile: 75   Other findings:   Normal pulmonary vein drainage into the left atrium.   Normal left atrial appendage without a thrombus.   Normal size of the pulmonary artery.   IMPRESSION: 1. Coronary calcium score of 68. This was 6 percentile for age and sex matched control.   2. Normal coronary origin with right dominance.   3. CAD-RADS 2. Mild non-obstructive CAD (25-49%). Consider non-atherosclerotic causes of chest pain. Consider preventive therapy and risk factor modification.  03/14/2021  Echo:  1. Left ventricular ejection fraction, by estimation, is 60 to 65%. The  left ventricle has normal function. The left ventricle has no regional  wall motion abnormalities. Left ventricular diastolic parameters are  consistent with Grade II diastolic  dysfunction (pseudonormalization). Elevated left ventricular end-diastolic  pressure.   2. Right ventricular systolic function is normal. The right ventricular  size is normal.   3. Left atrial size was mild to moderately dilated.   4. The mitral valve is grossly normal. Mild mitral valve regurgitation.  No evidence of mitral stenosis.   5. The aortic valve is grossly normal. Aortic valve regurgitation is not  visualized. No aortic stenosis is present.    6. The inferior vena cava is normal in size with <50% respiratory  variability, suggesting right atrial pressure of 8 mmHg.   Comparison(s): No significant change from prior study.   Conclusion(s)/Recommendation(s): Otherwise normal echocardiogram, with  minor abnormalities described in the report.  05/07/2016  Atrial Fibrillation Ablation: CONCLUSIONS: 1. Sinus rhythm upon presentation.   2. Intracardiac echo reveals a moderate sized left atrium with four separate pulmonary veins without evidence of pulmonary vein stenosis. 3. Ep study demonstrated electrical activity within the right superior and inferior pulmonary veins at baseline.  There was exit conduction through the left inferior pulmonary vein.  The left superior pulmonary vein was quiescent from the prior ablation procedure.  4. Successful sequential electrical isolation and anatomical encircling of the left inferior, right superior and right inferior pulmonary veins.  A WACA approach was used along the right PVs.  The left inferior pulmonary vein was isolated along the anterior and inferior quadrant.    Extensive ablation was required along the carina between the RSPV and RIPV. 5. Cavo-tricuspid isthmus ablation was performed with complete bidirectional isthmus block achieved.  6. No inducible arrhythmias following ablation both on and off of Isuprel 7. No early apparent complications.    EKG:   EKG is personally reviewed.  09/09/2021:  NSR.   Recent Labs: 01/27/2021: TSH 1.170 03/29/2021: ALT 31; B Natriuretic Peptide 26.1 03/30/2021: Magnesium 2.1 05/26/2021: BUN 12; Creatinine, Ser 0.60; Hemoglobin 13.8; Platelets 368; Potassium 3.9; Sodium 137   Recent Lipid Panel    Component Value Date/Time   CHOL 238 (H) 01/13/2013 0139   TRIG 247 (H) 01/13/2013 0139   HDL 48 01/13/2013 0139   CHOLHDL 5.0 01/13/2013 0139   VLDL 49 (H) 01/13/2013 0139   LDLCALC 141 (H) 01/13/2013 0139    Physical Exam:  VS:  BP 140/70 (BP  Location: Left Arm, Patient Position: Sitting, Cuff Size: Normal)   Pulse 80   Ht '5\' 4"'$  (1.626 m)   Wt 230 lb (104.3 kg)   BMI 39.48 kg/m     Wt Readings from Last 3 Encounters:  09/09/21 230 lb (104.3 kg)  06/10/21 228 lb (103.4 kg)  06/04/21 228 lb (103.4 kg)     GEN: Well nourished, well developed in no acute distress HEENT: Normal NECK: No JVD; No carotid bruits LYMPHATICS: No lymphadenopathy CARDIAC: RRR, no murmurs, rubs, gallops RESPIRATORY:  Clear to auscultation without rales, wheezing or rhonchi  ABDOMEN: Soft, non-tender, non-distended MUSCULOSKELETAL:  No edema; No deformity  SKIN: Warm and dry NEUROLOGIC:  Alert and oriented x 3 PSYCHIATRIC:  Normal affect       ASSESSMENT:    1. Paroxysmal atrial fibrillation (HCC)   2. Essential hypertension   3. Obstructive sleep apnea syndrome    PLAN:    In order of problems listed above:  #Paroxysmal atrial fibrillation Maintaining sinus rhythm.  On Coumadin for stroke prophylaxis.  INRs have been largely controlled.  #Hypertension Controlled.  Continue current medication regimen.  #Obstructive sleep apnea   Follow-up in 1 year.   Medication Adjustments/Labs and Tests Ordered: Current medicines are reviewed at length with the patient today.  Concerns regarding medicines are outlined above.  Orders Placed This Encounter  Procedures   EKG 12-Lead   No orders of the defined types were placed in this encounter.   I,Mathew Stumpf,acting as a Education administrator for Vickie Epley, MD.,have documented all relevant documentation on the behalf of Vickie Epley, MD,as directed by  Vickie Epley, MD while in the presence of Vickie Epley, MD.  I, Vickie Epley, MD, have reviewed all documentation for this visit. The documentation on 09/09/21 for the exam, diagnosis, procedures, and orders are all accurate and complete.   Signed, Hilton Cork. Quentin Ore, MD, [facility], [facility] 09/09/2021 10:52 PM     Electrophysiology [facility]

## 2021-09-09 NOTE — Patient Instructions (Addendum)
Medication Instructions:  Your physician recommends that you continue on your current medications as directed. Please refer to the Current Medication list given to you today. *If you need a refill on your cardiac medications before your next appointment, please call your pharmacy*  Lab Work: None. If you have labs (blood work) drawn today and your tests are completely normal, you will receive your results only by: MyChart Message (if you have MyChart) OR A paper copy in the mail If you have any lab test that is abnormal or we need to change your treatment, we will call you to review the results.  Testing/Procedures: None.  Follow-Up: At [facility], you and your health needs are our priority.  As part of our continuing mission to provide you with exceptional heart care, we have created designated Provider Care Teams.  These Care Teams include your primary Cardiologist (physician) and Advanced Practice Providers (APPs -  Physician Assistants and Nurse Practitioners) who all work together to provide you with the care you need, when you need it.  Your physician wants you to follow-up in: 12 months with one of the following Advanced Practice Providers on your designated Care Team:    Renee Ursuy, PA-C Michael "Andy" Tillery, PA-C   You will receive a reminder letter in the mail two months in advance. If you don't receive a letter, please call our office to schedule the follow-up appointment.  We recommend signing up for the patient portal called "MyChart".  Sign up information is provided on this After Visit Summary.  MyChart is used to connect with patients for Virtual Visits (Telemedicine).  Patients are able to view lab/test results, encounter notes, upcoming appointments, etc.  Non-urgent messages can be sent to your provider as well.   To learn more about what you can do with MyChart, go to https://www.mychart.com.    Any Other Special Instructions Will Be Listed Below (If  Applicable).         

## 2021-10-01 ENCOUNTER — Ambulatory Visit (INDEPENDENT_AMBULATORY_CARE_PROVIDER_SITE_OTHER): Payer: Medicare Other

## 2021-10-01 DIAGNOSIS — I480 Paroxysmal atrial fibrillation: Secondary | ICD-10-CM

## 2021-10-01 DIAGNOSIS — I4891 Unspecified atrial fibrillation: Secondary | ICD-10-CM | POA: Diagnosis not present

## 2021-10-01 DIAGNOSIS — Z5181 Encounter for therapeutic drug level monitoring: Secondary | ICD-10-CM

## 2021-10-01 LAB — POCT INR: INR: 3.2 — AB (ref 2.0–3.0)

## 2021-10-01 NOTE — Patient Instructions (Signed)
Description   Eat greens today and continue taking Warfarin '4mg'$  daily except '6mg'$  on Tuesdays, Thursdays, and Saturdays. Recheck INR in 4 weeks (usually 6 weeks recheck). Call Coumadin Clinic [phone] Main # [phone] with any questions

## 2021-10-07 ENCOUNTER — Ambulatory Visit (INDEPENDENT_AMBULATORY_CARE_PROVIDER_SITE_OTHER): Payer: Medicare Other | Admitting: Podiatry

## 2021-10-07 ENCOUNTER — Encounter: Payer: Self-pay | Admitting: Podiatry

## 2021-10-07 DIAGNOSIS — M79675 Pain in left toe(s): Secondary | ICD-10-CM

## 2021-10-07 DIAGNOSIS — B351 Tinea unguium: Secondary | ICD-10-CM | POA: Diagnosis not present

## 2021-10-07 DIAGNOSIS — Z7901 Long term (current) use of anticoagulants: Secondary | ICD-10-CM

## 2021-10-07 DIAGNOSIS — E1169 Type 2 diabetes mellitus with other specified complication: Secondary | ICD-10-CM

## 2021-10-07 DIAGNOSIS — D689 Coagulation defect, unspecified: Secondary | ICD-10-CM

## 2021-10-07 DIAGNOSIS — M79674 Pain in right toe(s): Secondary | ICD-10-CM

## 2021-10-07 NOTE — Progress Notes (Signed)
This patient returns to my office for at risk foot care.  This patient requires this care by a professional since this patient will be at risk due to having coagulation defect.  Patient is taking coumadin.  This patient is unable to cut nails herself since the patient cannot reach her nails.These nails are painful walking and wearing shoes.  This patient presents for at risk foot care today.  General Appearance  Alert, conversant and in no acute stress.  Vascular  Dorsalis pedis and posterior tibial  pulses are palpable  bilaterally.  Capillary return is within normal limits  bilaterally. Temperature is within normal limits  bilaterally.  Neurologic  Senn-Weinstein monofilament wire test within normal limits  bilaterally. Muscle power within normal limits bilaterally.  Nails Thick disfigured discolored nails with subungual debris  Second left and first and third right foot.  No evidence of bacterial infection or drainage bilaterally.  Orthopedic  No limitations of motion  feet .  No crepitus or effusions noted.  No bony pathology or digital deformities noted.  Skin  normotropic skin with no porokeratosis noted bilaterally.  No signs of infections or ulcers noted.     Onychomycosis  Pain in right toes  Pain in left toes  Consent was obtained for treatment procedures.   Mechanical debridement of nails 1-5  bilaterally performed with a nail nipper.  Filed with dremel without incident.    Return office visit   9 weeks                  Told patient to return for periodic foot care and evaluation due to potential at risk complications.   Jerusalem Brownstein DPM  

## 2021-10-13 ENCOUNTER — Encounter: Payer: Self-pay | Admitting: Gastroenterology

## 2021-10-28 ENCOUNTER — Other Ambulatory Visit: Payer: Self-pay | Admitting: Physician Assistant

## 2021-10-29 ENCOUNTER — Ambulatory Visit (INDEPENDENT_AMBULATORY_CARE_PROVIDER_SITE_OTHER): Payer: Medicare Other

## 2021-10-29 DIAGNOSIS — I4891 Unspecified atrial fibrillation: Secondary | ICD-10-CM

## 2021-10-29 DIAGNOSIS — I480 Paroxysmal atrial fibrillation: Secondary | ICD-10-CM | POA: Diagnosis not present

## 2021-10-29 DIAGNOSIS — Z5181 Encounter for therapeutic drug level monitoring: Secondary | ICD-10-CM

## 2021-10-29 LAB — POCT INR: INR: 3.2 — AB (ref 2.0–3.0)

## 2021-10-29 NOTE — Patient Instructions (Signed)
Description   Eat greens today and START taking Warfarin '4mg'$  daily except '6mg'$  on Tuesdays and Saturdays. Recheck INR in 3 weeks (usually 6 weeks recheck). Call Coumadin Clinic [phone] Main # [phone] with any questions

## 2021-11-07 ENCOUNTER — Telehealth: Payer: Self-pay

## 2021-11-07 NOTE — Telephone Encounter (Signed)
First attempt to reach patient to schedule tele visit. No answer. LVM

## 2021-11-07 NOTE — Telephone Encounter (Signed)
Patient with diagnosis of atrial fibrillation on warfarin for anticoagulation.    Procedure: right knee arthroplasty Date of procedure: TBD   CHA2DS2-VASc Score = 5   This indicates a 7.2% annual risk of stroke. The patient's score is based upon: CHF History: 0 HTN History: 1 Diabetes History: 1 Stroke History: 0 Vascular Disease History: 1 Age Score: 1 Gender Score: 1   CrCl 150 Platelet count 368  Per office protocol, patient can hold warfarin for 5 days prior to procedure.   Patient WILL need bridging with Lovenox (enoxaparin) around procedure.  She [facility] - next appt 11/19/21 - have left note for RN to verify date of surgery in order to facilitate bridging.  If appointment set < 1 week from that date pt will need to move Coumadin appointment  **This guidance is not considered finalized until pre-operative APP has relayed final recommendations.**

## 2021-11-07 NOTE — Telephone Encounter (Signed)
   Name: [name]  DOB: [birth year]  MRN: [id number]  Primary Cardiologist: Buford Dresser, MD   Preoperative team, please contact this patient and set up a phone call appointment for further preoperative risk assessment. Please obtain consent and complete medication review. Thank you for your help.  I confirm that guidance regarding antiplatelet and oral anticoagulation therapy has been completed and, if necessary, noted below.  Patient with diagnosis of atrial fibrillation on warfarin for anticoagulation.     Procedure: right knee arthroplasty Date of procedure: TBD     CHA2DS2-VASc Score = 5   This indicates a 7.2% annual risk of stroke. The patient's score is based upon: CHF History: 0 HTN History: 1 Diabetes History: 1 Stroke History: 0 Vascular Disease History: 1 Age Score: 1 Gender Score: 1     CrCl 150 Platelet count 368   Per office protocol, patient can hold warfarin for 5 days prior to procedure.    Patient WILL need bridging with Lovenox (enoxaparin) around procedure.   She is followed by [facility] - next appt 11/19/21 - have left note for RN to verify date of surgery in order to facilitate bridging.  If appointment set < 1 week from that date pt will need to move Coumadin appointment.   Lenna Sciara, NP 11/07/2021, 4:44 PM [place]

## 2021-11-07 NOTE — Telephone Encounter (Signed)
   Pre-operative Risk Assessment    Patient Name: [name]  DOB: [birth year] MRN: [id number]      Request for Surgical Clearance    Procedure:  Right Knee Arthroplasty   Date of Surgery:  Clearance TBD                                 Surgeon:  Melrose Nakayama, MD Surgeon's Group or Practice Name:  [facility]  Phone number:  [phone] Fax number:  [phone]   Type of Clearance Requested:   - Medical    Type of Anesthesia:  Spinal   Additional requests/questions:    SignedMendel Ryder   11/07/2021, 2:16 PM

## 2021-11-12 NOTE — Telephone Encounter (Signed)
Left message for Marlin Canary, surgery scheduler for Dr. Rhona Raider. See previous notes, it seems that we need to first see if surgery is before 11/19/21. If so CVRR appt will need to be moved up sooner and pt will also need a tele pre op appt.

## 2021-11-13 ENCOUNTER — Telehealth: Payer: Self-pay | Admitting: *Deleted

## 2021-11-13 NOTE — Telephone Encounter (Signed)
Pt agreeable to plan of care for tele pre op appt 11/18/21 @ 10 am. Med rec and consent are done.      Patient Consent for Virtual Visit        [name] has provided verbal consent on 11/13/2021 for a virtual visit (video or telephone).   CONSENT FOR VIRTUAL VISIT FOR:  [name]  By participating in this virtual visit I agree to the following:  I hereby voluntarily request, consent and authorize [facility] and its employed or contracted physicians, physician assistants, nurse practitioners or other licensed health care professionals (the Practitioner), to provide me with telemedicine health care services (the "Services") as deemed necessary by the treating Practitioner. I acknowledge and consent to receive the Services by the Practitioner via telemedicine. I understand that the telemedicine visit will involve communicating with the Practitioner through live audiovisual communication technology and the disclosure of certain medical information by electronic transmission. I acknowledge that I have been given the opportunity to request an in-person assessment or other available alternative prior to the telemedicine visit and am voluntarily participating in the telemedicine visit.  I understand that I have the right to withhold or withdraw my consent to the use of telemedicine in the course of my care at any time, without affecting my right to future care or treatment, and that the Practitioner or I may terminate the telemedicine visit at any time. I understand that I have the right to inspect all information obtained and/or recorded in the course of the telemedicine visit and may receive copies of available information for a reasonable fee.  I understand that some of the potential risks of receiving the Services via telemedicine include:  Delay or interruption in medical evaluation due to technological equipment failure or disruption; Information transmitted may not be sufficient  (e.g. poor resolution of images) to allow for appropriate medical decision making by the Practitioner; and/or  In rare instances, security protocols could fail, causing a breach of personal health information.  Furthermore, I acknowledge that it is my responsibility to provide information about my medical history, conditions and care that is complete and accurate to the best of my ability. I acknowledge that Practitioner's advice, recommendations, and/or decision may be based on factors not within their control, such as incomplete or inaccurate data provided by me or distortions of diagnostic images or specimens that may result from electronic transmissions. I understand that the practice of medicine is not an exact science and that Practitioner makes no warranties or guarantees regarding treatment outcomes. I acknowledge that a copy of this consent can be made available to me via my patient portal ([place] [place]), or I can request a printed copy by calling the office of [facility].    I understand that my insurance will be billed for this visit.   I have read or had this consent read to me. I understand the contents of this consent, which adequately explains the benefits and risks of the Services being provided via telemedicine.  I have been provided ample opportunity to ask questions regarding this consent and the Services and have had my questions answered to my satisfaction. I give my informed consent for the services to be provided through the use of telemedicine in my medical care

## 2021-11-13 NOTE — Telephone Encounter (Signed)
Pt agreeable to plan of care for tele pre op appt 11/18/21 @ 10 am. Med rec and consent are done.

## 2021-11-13 NOTE — Telephone Encounter (Signed)
[name], surgery scheduler for Dr. Rhona Raider left vm today for me that the pt does not have a surgery date at this time, as they are awaiting clearance first before setting date.   I will update the pre op provider and pharm-d as to confirm when the pt should have her tele appt as well.

## 2021-11-18 ENCOUNTER — Encounter: Payer: Self-pay | Admitting: Physician Assistant

## 2021-11-18 ENCOUNTER — Ambulatory Visit: Payer: Medicare Other | Attending: Cardiology | Admitting: Physician Assistant

## 2021-11-18 DIAGNOSIS — Z01810 Encounter for preprocedural cardiovascular examination: Secondary | ICD-10-CM | POA: Insufficient documentation

## 2021-11-18 NOTE — Progress Notes (Addendum)
Virtual Visit via Telephone Note   Because of [name]'s co-morbid illnesses, she is at least at moderate risk for complications without adequate follow up.  This format is felt to be most appropriate for this patient at this time.  The patient did not have access to video technology/had technical difficulties with video requiring transitioning to audio format only (telephone).  All issues noted in this document were discussed and addressed.  No physical exam could be performed with this format.  Please refer to the patient's chart for her consent to telehealth for [facility].  Evaluation Performed:  Preoperative cardiovascular risk assessment _____________   Date:  11/18/2021   Patient ID:  [name], DOB [birth year], MRN [id number] Patient Location:  [name], [place] Provider location:   [place], [place]  Primary Care Provider:  Antony Contras, MD Primary Cardiologist:  Kelly Dresser, MD  Chief Complaint / Patient Profile   66 y.o. y/o female with a h/o  Paroxysmal Atrial fibrillation  Typical appearing atrial flutter S/p PVI ablation at [facility] in 2010 (Dr. Annabell [name]) S/p PVI and CTI ablation in 04/2016  Failed flecainide Rx (developed BBB as well); Dronedarone DCd after ablation in 2018 Warfarin Rx Coronary artery Ca2+ CCTA 03/2021: CAC score 68, mild non-obs CAD Echo 12/22: EF 60-65 mild MR Myoview 04/2015: Low risk Hypertension  Hyperlipidemia  Recurrent DVT Rheumatoid arthritis  BPPV GERD OSA Pre-diabetes   who is pending R knee replacement with [name] under spinal anesthesia and presents today for telephonic preoperative cardiovascular risk assessment.  Past Medical History    Past Medical History:  Diagnosis Date   Allergic rhinitis    Anginal pain ([place])    Arthritis    "back" (05/07/2016)   Asthma    Atrial flutter ([place])    a. Remotely per notes.   Carpal tunnel syndrome, bilateral    Colon polyps    Diverticulitis     Dizziness    DVT (deep venous thrombosis) ([place]) 1980s x2 -    between birth of two sons. Saw hematology - was told she has a hypercoagulable disorder, should be on Coumadin lifelong.   Dysrhythmia    atrial fib   Family history of adverse reaction to anesthesia    "mother gets PONV"   Gallbladder disease    GERD (gastroesophageal reflux disease)    GIB (gastrointestinal bleeding) 04/14/2018   Halothane adverse reaction    narrow small opening   Heart murmur    Hiatal hernia    History of hiatal hernia    "small one/CTA 05/01/2016" (05/07/2016)   History of kidney stones    HTN (hypertension)    Hypercholesteremia    hx; "brought it down w/diet" (05/07/2016)   Hyperglycemia    a. A1c 6.1 in 2014.   Inguinal hernia    Iron deficiency anemia due to chronic blood loss 04/14/2018   Iron malabsorption 04/14/2018   Left sided sciatica    Normal cardiac stress test 06/2012   Obesity    OSA (obstructive sleep apnea)    a. Mild, did not tolerate CPAP (05/07/2016)   PAF (paroxysmal atrial fibrillation) ([place])    a. s/p afib ablation at [facility] 2010 (Dr. Annabell [name]). b. Recurrent AF s/p DCCV 06/2010. c. On flecainide.    Paroxysmal atrial fibrillation ([place]) 09/09/2009   Qualifier: Diagnosis of  By: [name] [name], Kelly     PONV (postoperative nausea and vomiting)    PPD positive, treated 1977   "treated for 1  yr after exposure to patient"   Pre-diabetes    RSV infection ~ 2006   Vertigo    Wandering (atrial) pacemaker    a. Remotely per notes.  (  pt. states has a wandering p wave ) no pacemaker   Past Surgical History:  Procedure Laterality Date   ANKLE ARTHODESIS W/ ARTHROSCOPY Right 11/12/2020   ATRIAL ABLATION SURGERY  05/07/2016   "fib and flutter"   ATRIAL FIBRILLATION ABLATION  2010   at [place] N/A 05/07/2016   Procedure: Atrial Fibrillation Ablation;  Surgeon: Kelly Grayer, MD;  Location: [facility];  Service: Cardiovascular;   Laterality: N/A;   BREAST CYST ASPIRATION Bilateral    CARDIAC CATHETERIZATION  2008   CARPAL TUNNEL RELEASE Bilateral    CATARACT EXTRACTION Bilateral 2022   CESAREAN SECTION  1986; 1988   COLON RESECTION     COLONOSCOPY  X 2   COLONOSCOPY W/ BIOPSIES AND POLYPECTOMY  X 2   CYSTOSCOPY/URETEROSCOPY/HOLMIUM LASER/STENT PLACEMENT Left 12/23/2019   Procedure: CYSTOSCOPY/URETEROSCOPY/HOLMIUM LASER/STENT PLACEMENT;  Surgeon: Kelly Fries, MD;  Location: [facility];  Service: Urology;  Laterality: Left;   ESOPHAGOGASTRODUODENOSCOPY     ESOPHAGOGASTRODUODENOSCOPY (EGD) WITH PROPOFOL N/A 07/19/2017   Procedure: ESOPHAGOGASTRODUODENOSCOPY (EGD) WITH PROPOFOL;  Surgeon: Kelly Flock, MD;  [facility];  Service: Gastroenterology;  Laterality: N/A;   INGUINAL HERNIA REPAIR Right    KNEE ARTHROSCOPY Right 06/10/2021   Procedure: RIGHT KNEE ARTHROSCOPY;  Surgeon: Kelly Nakayama, MD;  Location: [facility];  Service: Orthopedics;  Laterality: Right;   [place]   POLYPECTOMY N/A 07/19/2017   Procedure: POLYPECTOMY;  Surgeon: Kelly Flock, MD;  [facility];  Service: Gastroenterology;  Laterality: N/A;   PROCTOSCOPY N/A 08/06/2017   Procedure: PROCTOSCOPY;  Surgeon: Kelly Boston, MD;  Location: [facility];  Service: General;  Laterality: N/A;   TMJ ARTHROPLASTY     TUBAL LIGATION  1988    Allergies  Allergies  Allergen Reactions   Aspirin Other (See Comments)    Makes asthma worse and makes stomach hurt   Banana Swelling and Cough    Inside of the mouth swells   Food Other (See Comments) and Cough    Nuts- mucus membranes inflamed/cough    Iodinated Contrast Media Anaphylaxis    Required epinephrine. Since then, has tolerated with premed. Other reaction(s): wheeze...requires premdication with prednisone 3 days in advance   Latex Hives and Cough    Wheezing/cough  Other reaction(s): hives/wheeze   Meperidine Hcl Other (See Comments)     Projectile vomiting    Peanut-Containing Drug Products Swelling and Cough    Inside of the mouth swells   Penicillins Anaphylaxis    Has patient had a PCN reaction causing immediate rash, facial/tongue/throat swelling, SOB or lightheadedness with hypotension:Yes Has patient had a PCN reaction causing severe rash involving mucus membranes or skin necrosis:Yes Has patient had a PCN reaction that required hospitalization:Treated in ER w/epi & breathing treatments Has patient had a PCN reaction occurring within the last 10 years:No If all of the above answers are "NO", then may proceed with Cephalosporin use.    Statins Other (See Comments)    Severe leg pain - has tried most of them   Sulfonamide Derivatives Anaphylaxis   Tape Hives and Dermatitis    No plastic tape!!   Alfuzosin Other (See Comments)    Headache, dizziness   Halothane     Listed under anesthesia adverse  reactions pt does not know what reaction or if any at preop on 05/26/21.    Other Other (See Comments)    Vascepa- upset stomach   Sulfamethoxazole Hives    History of Present Illness    [name] is a 65 y.o. female who presents via audio/video conferencing for a telehealth visit today.  Pt was last seen in cardiology clinic by Dr. Quentin Ore on 09/09/2021. Next anticoag appointment 11/19/2021. The patient is now pending procedure as outlined above. Since her last visit, she has been doing well without chest pain, shortness of breath, syncope.  She sleeps on 2 pillows chronically.  She has not had lower extremity edema.  Despite her knee pain she is able to continue to do most activities.   Home Medications    Prior to Admission medications   Medication Sig Start Date End Date Taking? Authorizing Provider  acetaminophen (TYLENOL) 650 MG CR tablet Take 1,300 mg by mouth every 8 (eight) hours as needed for pain.     [provider]  cetirizine (ZYRTEC) 10 MG tablet Take 10 mg by mouth daily.    [provider]  Cholecalciferol (VITAMIN D3) 1000 UNITS CAPS Take 2,000 Units by mouth daily.    [provider]  dicyclomine (BENTYL) 10 MG capsule TAKE 1 CAPSULE BY MOUTH 30 MINS BEFORE MEALS AND AT BEDTIME AS NEEDED FOR PAIN/CRAMPING 12/23/18   Zehr, Janett Billow D, PA-C  enoxaparin (LOVENOX) 100 MG/ML injection Inject 1 mL (100 mg total) into the skin every 12 (twelve) hours. As instructed by Anticoagulation Clinic 05/27/21   Allred, Jeneen Rinks, MD  ezetimibe (ZETIA) 10 MG tablet TAKE 1 TABLET BY MOUTH EVERY DAY 05/05/21   Hilty, Nadean Corwin, MD  FLOVENT HFA 220 MCG/ACT inhaler Inhale 1 puff into the lungs 2 (two) times daily. 04/20/16   [provider]  fluticasone (FLONASE) 50 MCG/ACT nasal spray Place 1 spray into both nostrils 2 (two) times daily.  06/02/12   [provider]  isosorbide mononitrate (IMDUR) 30 MG 24 hr tablet TAKE 1 TABLET BY MOUTH EVERY DAY 10/29/21   Vickie Epley, MD  losartan (COZAAR) 25 MG tablet Take 1 tablet (25 mg total) by mouth daily. 12/20/13   Allred, Jeneen Rinks, MD  meclizine (ANTIVERT) 25 MG tablet Take 1 tablet (25 mg total) by mouth 3 (three) times daily as needed for dizziness. 01/19/18   Dorie Rank, MD  Multiple Vitamin (MULTIVITAMIN WITH MINERALS) TABS tablet Take 1 tablet by mouth daily.    [provider]  nitroGLYCERIN (NITROSTAT) 0.4 MG SL tablet PLACE 1 TABLET UNDER THE TONGUE EVERY 5 MINUTES AS NEEDED. 05/22/21   Baldwin Jamaica, PA-C  omeprazole (PRILOSEC) 40 MG capsule Take 1 capsule (40 mg total) by mouth 2 (two) times daily. 04/25/21   Armbruster, Carlota Raspberry, MD  polyethylene glycol (MIRALAX) 17 g packet Take 17 g by mouth 2 (two) times daily. Titrate as needed 01/23/21   Armbruster, Carlota Raspberry, MD  PROAIR HFA 108 (90 BASE) MCG/ACT inhaler Inhale 2 puffs into the lungs every 6 (six) hours as needed for wheezing or shortness of breath.  03/22/12   [provider]  venlafaxine XR (EFFEXOR-XR) 37.5 MG 24 hr capsule Take 37.5 mg by  mouth daily with breakfast.  11/21/12   [provider]  verapamil (CALAN) 80 MG tablet Take 160 mg by mouth 2 (two) times daily.    [provider]  warfarin (COUMADIN) 2 MG tablet Take '4mg'$  daily except '6mg'$   on Tuesdays, Thursdays, and Saturdays or as [place]. 06/09/21   Kelly Grayer, MD    Physical Exam    Vital Signs:  ARACELI ARANGO does not have vital signs available for review today.  Given telephonic nature of communication, physical exam is limited. AAOx3. NAD. Normal affect.  Speech and respirations are unlabored.  Accessory Clinical Findings    None  Assessment & Plan    1.  Preoperative Cardiovascular Risk Assessment:    Ms. Bari's perioperative risk of a major cardiac event is 0.9% according to the Revised Cardiac Risk Index (RCRI).  Therefore, she is at low risk for perioperative complications.   Her functional capacity is good at 4.74 METs according to the Duke Activity Status Index (DASI). Recommendations: According to ACC/AHA guidelines, no further cardiovascular testing needed.  The patient may proceed to surgery at acceptable risk.   Antiplatelet and/or Anticoagulation Recommendations: Coumadin can by held for 5 days prior to surgery.  Please resume post op when felt to be safe. The patient will require Lovenox bridging while off of Coumadin.  Arrangements will be made with our Coumadin clinic at her next visit on 11/19/2021.   A copy of this note will be routed to requesting surgeon.  Time:   Today, I have spent 4 minutes with the patient with telehealth technology discussing medical history, symptoms, and management plan.     Richardson Dopp, PA-C 11/18/2021, 10:16 AM

## 2021-11-19 ENCOUNTER — Ambulatory Visit: Payer: Medicare Other | Attending: Internal Medicine | Admitting: *Deleted

## 2021-11-19 DIAGNOSIS — Z5181 Encounter for therapeutic drug level monitoring: Secondary | ICD-10-CM | POA: Diagnosis not present

## 2021-11-19 DIAGNOSIS — Z01810 Encounter for preprocedural cardiovascular examination: Secondary | ICD-10-CM | POA: Insufficient documentation

## 2021-11-19 DIAGNOSIS — I4891 Unspecified atrial fibrillation: Secondary | ICD-10-CM | POA: Insufficient documentation

## 2021-11-19 DIAGNOSIS — I480 Paroxysmal atrial fibrillation: Secondary | ICD-10-CM | POA: Insufficient documentation

## 2021-11-19 LAB — POCT INR: INR: 2.6 (ref 2.0–3.0)

## 2021-11-19 NOTE — Patient Instructions (Addendum)
Description   Continue taking Warfarin '4mg'$  daily except '6mg'$  on Tuesdays and Saturdays. Recheck INR in 5 weeks. Call Coumadin Clinic [phone] Main # [phone] with any questions  Call with procedure date.

## 2021-12-07 ENCOUNTER — Other Ambulatory Visit: Payer: Self-pay | Admitting: Internal Medicine

## 2021-12-07 DIAGNOSIS — I480 Paroxysmal atrial fibrillation: Secondary | ICD-10-CM

## 2021-12-11 ENCOUNTER — Encounter (HOSPITAL_BASED_OUTPATIENT_CLINIC_OR_DEPARTMENT_OTHER): Payer: Self-pay | Admitting: Cardiology

## 2021-12-11 ENCOUNTER — Ambulatory Visit (INDEPENDENT_AMBULATORY_CARE_PROVIDER_SITE_OTHER): Payer: Medicare Other | Admitting: Nurse Practitioner

## 2021-12-11 ENCOUNTER — Encounter: Payer: Self-pay | Admitting: Nurse Practitioner

## 2021-12-11 ENCOUNTER — Encounter (HOSPITAL_BASED_OUTPATIENT_CLINIC_OR_DEPARTMENT_OTHER): Payer: Medicare Other | Admitting: Cardiology

## 2021-12-11 ENCOUNTER — Ambulatory Visit: Payer: Medicare Other | Admitting: Gastroenterology

## 2021-12-11 ENCOUNTER — Encounter (HOSPITAL_BASED_OUTPATIENT_CLINIC_OR_DEPARTMENT_OTHER): Payer: Self-pay

## 2021-12-11 VITALS — BP 122/80 | HR 95 | Ht 64.0 in | Wt 226.0 lb

## 2021-12-11 DIAGNOSIS — I200 Unstable angina: Secondary | ICD-10-CM | POA: Diagnosis not present

## 2021-12-11 DIAGNOSIS — K219 Gastro-esophageal reflux disease without esophagitis: Secondary | ICD-10-CM | POA: Diagnosis not present

## 2021-12-11 MED ORDER — SUCRALFATE 1 G PO TABS: ORAL_TABLET | ORAL | Status: DC

## 2021-12-11 NOTE — Progress Notes (Signed)
Chief Complaint:  follow up.    Assessment &  Plan   # 66 yo female with GERD without Barrett's.  Breakthrough heartburn and regurgitation on twice daily PPI.  Carafate prescribed at her last visit here in February but being unclear about its purpose she did not start it.  Discussed anti-reflux measures such as avoidance of late meals / bedtime snacks, HOB elevation (or use of wedge pillow), weight reduction ( if applicable)  / maintaining a healthy BMI ( body mass index),  and avoidance of trigger foods and caffeine.  Continue current GERD medications including omeprazole 40 mg twice daily. Not a great candidate for hiatal hernia surgery secondary to cardiovascular issues.   # History of adenomatous colon polyps March 2023.  Four adenomas removed at time of last colonoscopy.  A 5-year recall colonoscopy was recommended  HPI   [name] is a 66 y.o. female known to Dr.  Havery Moros with a past medical history significant for adenomatous colon polyps, diverticular disease status post sigmoid resection in 2019, A-fib with RVR on warfarin, vertigo, GERD, hiatal hernia, gastric polyps. See PMH /PSH for additional history .   04/25/21 last office visit  Improvement in GERD symptoms with high-dose PPI.  Long-term risk of chronic PPIs were discussed but the lower dose was not working for her.  We chose to continue twice daily omeprazole for at least 6 weeks and hopefully go back to once daily following that.  We added Carafate to help with nocturnal symptoms.  Patient felt not to be a great surgical candidate for hiatal hernia repair given cardiovascular issues.  Advised to follow-up in 3 to 4 months  Interval History:  [name] continues to have breakthrough heartburn and regurgitation on twice daily PPI.  Symptoms are worse during the day after eating.  She is not really having nocturnal symptoms.  She does go to bed on an empty stomach.  She propped herself on pillows, sometimes sleeps in a  recliner.  She eats small portions.  She avoids spicy foods, acidic foods and black peppers as they are all known triggers for her . She never did start the Carafate, she was not really clear about what it was for.  She is going to have a knee replacement next month.  She is on warfarin but will transition to Lovenox for surgery   Previous GI Evaluation   March 2023 surveillance colonoscopy -3 mm ascending colon polyp removed.  Three 3-4 mm polyps removed from the transverse colon.  Diverticulosis in the sigmoid colon.  Patent end-to-end colocolonic anastomosis characterized by healthy-appearing mucosa.  Internal hemorrhoids.  Anal papilla were hypertrophied  EGD 2019 for gastric polyp treatment - Esophagogastric landmarks identified. - 3 cm hiatal hernia. - Normal esophagus - Three gastric polyps in the cardia / hernia sac, ulcerated / inflamed. Injected with epinephrine and then resected and retrieved. 3 hemostasis clips were placed. - Multiple large gastric body polyps. 3 representative samples resected and retrieved. - Normal duodenal bulb and second portion of the duodenum.  Polyp path - Fundic gland polyps   Labs:     Latest Ref Rng & Units 05/26/2021   10:22 AM 03/30/2021    4:34 AM 03/29/2021    3:22 PM  CBC  WBC 4.0 - 10.5 K/uL 7.5  9.0  10.8   Hemoglobin 12.0 - 15.0 g/dL 13.8  15.5  15.0   Hematocrit 36.0 - 46.0 % 42.3  46.0  43.9   Platelets 150 -  400 K/uL 368  318  341        Latest Ref Rng & Units 03/29/2021    3:22 PM 06/03/2020    2:25 PM 12/22/2019   10:52 PM  Hepatic Function  Total Protein 6.5 - 8.1 g/dL 7.3  7.5  7.4   Albumin 3.5 - 5.0 g/dL 3.6  4.0  4.0   AST 15 - 41 U/L _0 ALT 0 - 44 U/L _1 Alk Phosphatase 38 - 126 U/L 67  65  78   Total Bilirubin 0.3 - 1.2 mg/dL 0.3  0.5  0.5   Bilirubin, Direct 0.0 - 0.2 mg/dL <0.1        Past Medical History:  Diagnosis Date   Allergic rhinitis    Anginal pain ([place])    Arthritis    "back"  (05/07/2016)   Asthma    Atrial flutter ([place])    a. Remotely per notes.   Carpal tunnel syndrome, bilateral    Colon polyps    Diverticulitis    Dizziness    DVT (deep venous thrombosis) ([place]) 1980s x2 -    between birth of two sons. Saw hematology - was told she has a hypercoagulable disorder, should be on Coumadin lifelong.   Dysrhythmia    atrial fib   Family history of adverse reaction to anesthesia    "mother gets PONV"   Gallbladder disease    GERD (gastroesophageal reflux disease)    GIB (gastrointestinal bleeding) 04/14/2018   Halothane adverse reaction    narrow small opening   Heart murmur    Hiatal hernia    History of hiatal hernia    "small one/CTA 05/01/2016" (05/07/2016)   History of kidney stones    HTN (hypertension)    Hypercholesteremia    hx; "brought it down w/diet" (05/07/2016)   Hyperglycemia    a. A1c 6.1 in 2014.   Inguinal hernia    Iron deficiency anemia due to chronic blood loss 04/14/2018   Iron malabsorption 04/14/2018   Left sided sciatica    Normal cardiac stress test 06/2012   Obesity    OSA (obstructive sleep apnea)    a. Mild, did not tolerate CPAP (05/07/2016)   PAF (paroxysmal atrial fibrillation) ([place])    a. s/p afib ablation at [facility] (Dr. Annabell Howells). b. Recurrent AF s/p DCCV 06/2010. c. On flecainide.    Paroxysmal atrial fibrillation ([place]) 09/09/2009   Qualifier: Diagnosis of  By: Selena Batten CMA, [place]     PONV (postoperative nausea and vomiting)    PPD positive, treated 1977   "treated for 1 yr after exposure to patient"   Pre-diabetes    RSV infection ~ 2006   Vertigo    Wandering (atrial) pacemaker    a. Remotely per notes.  (  pt. states has a wandering p wave ) no pacemaker    Past Surgical History:  Procedure Laterality Date   ANKLE ARTHODESIS W/ ARTHROSCOPY Right 11/12/2020   ATRIAL ABLATION SURGERY  05/07/2016   "fib and flutter"   ATRIAL FIBRILLATION ABLATION  2010   at [facility] N/A 05/07/2016   Procedure: Atrial Fibrillation Ablation;  Surgeon: Thompson Grayer, MD;  Location: [facility];  Service: Cardiovascular;  Laterality: N/A;   BREAST CYST ASPIRATION Bilateral    CARDIAC CATHETERIZATION  2008   CARPAL TUNNEL RELEASE Bilateral    CATARACT EXTRACTION Bilateral 2022  CESAREAN SECTION  1986; 1988   COLON RESECTION     COLONOSCOPY  X 2   COLONOSCOPY W/ BIOPSIES AND POLYPECTOMY  X 2   CYSTOSCOPY/URETEROSCOPY/HOLMIUM LASER/STENT PLACEMENT Left 12/23/2019   Procedure: CYSTOSCOPY/URETEROSCOPY/HOLMIUM LASER/STENT PLACEMENT;  Surgeon: Robley Fries, MD;  Location: [facility];  Service: Urology;  Laterality: Left;   ESOPHAGOGASTRODUODENOSCOPY     ESOPHAGOGASTRODUODENOSCOPY (EGD) WITH PROPOFOL N/A 07/19/2017   Procedure: ESOPHAGOGASTRODUODENOSCOPY (EGD) WITH PROPOFOL;  Surgeon: Yetta Flock, MD;  [facility]: [facility];  Service: Gastroenterology;  Laterality: N/A;   INGUINAL HERNIA REPAIR Right    KNEE ARTHROSCOPY Right 06/10/2021   Procedure: RIGHT KNEE ARTHROSCOPY;  Surgeon: Melrose Nakayama, MD;  Location: [facility];  Service: Orthopedics;  Laterality: Right;   [place]   POLYPECTOMY N/A 07/19/2017   Procedure: POLYPECTOMY;  Surgeon: Yetta Flock, MD;  [facility];  Service: Gastroenterology;  Laterality: N/A;   PROCTOSCOPY N/A 08/06/2017   Procedure: PROCTOSCOPY;  Surgeon: Michael Boston, MD;  Location: [facility];  Service: General;  Laterality: N/A;   TMJ ARTHROPLASTY     TUBAL LIGATION  1988    Current Medications, Allergies, Family History and Social History were reviewed in [facility] record.     Current Outpatient Medications  Medication Sig Dispense Refill   acetaminophen (TYLENOL) 650 MG CR tablet Take 1,300 mg by mouth every 8 (eight) hours as needed for pain.      cetirizine (ZYRTEC) 10 MG tablet Take 10 mg by mouth daily.     Cholecalciferol (VITAMIN D3) 1000 UNITS  CAPS Take 2,000 Units by mouth daily.     dicyclomine (BENTYL) 10 MG capsule TAKE 1 CAPSULE BY MOUTH 30 MINS BEFORE MEALS AND AT BEDTIME AS NEEDED FOR PAIN/CRAMPING 360 capsule 1   enoxaparin (LOVENOX) 100 MG/ML injection Inject 1 mL (100 mg total) into the skin every 12 (twelve) hours. As instructed by Anticoagulation Clinic 30 mL 0   ezetimibe (ZETIA) 10 MG tablet TAKE 1 TABLET BY MOUTH EVERY DAY 90 tablet 3   FLOVENT HFA 220 MCG/ACT inhaler Inhale 1 puff into the lungs 2 (two) times daily.  1   fluticasone (FLONASE) 50 MCG/ACT nasal spray Place 1 spray into both nostrils 2 (two) times daily.      isosorbide mononitrate (IMDUR) 30 MG 24 hr tablet TAKE 1 TABLET BY MOUTH EVERY DAY 90 tablet 3   losartan (COZAAR) 25 MG tablet Take 1 tablet (25 mg total) by mouth daily. 90 tablet 0   meclizine (ANTIVERT) 25 MG tablet Take 1 tablet (25 mg total) by mouth 3 (three) times daily as needed for dizziness. 30 tablet 0   Multiple Vitamin (MULTIVITAMIN WITH MINERALS) TABS tablet Take 1 tablet by mouth daily.     nitroGLYCERIN (NITROSTAT) 0.4 MG SL tablet PLACE 1 TABLET UNDER THE TONGUE EVERY 5 MINUTES AS NEEDED. 25 tablet 11   omeprazole (PRILOSEC) 40 MG capsule Take 1 capsule (40 mg total) by mouth 2 (two) times daily. 180 capsule 1   polyethylene glycol (MIRALAX) 17 g packet Take 17 g by mouth 2 (two) times daily. Titrate as needed 14 each 0   PROAIR HFA 108 (90 BASE) MCG/ACT inhaler Inhale 2 puffs into the lungs every 6 (six) hours as needed for wheezing or shortness of breath.      traMADol (ULTRAM) 50 MG tablet Take 50 mg by mouth 3 (three) times daily as needed.     venlafaxine XR (EFFEXOR-XR) 37.5 MG 24 hr capsule  Take 37.5 mg by mouth daily with breakfast.      verapamil (CALAN) 80 MG tablet Take 160 mg by mouth 2 (two) times daily.     warfarin (COUMADIN) 2 MG tablet TAKE 4MG DAILY EXCEPT 6MG ON TUESDAYS, THURSDAYS,AND SATURDAYS AS DIRECTED BY ANTICOAGULATION CLINIC 225 tablet 1   No current  facility-administered medications for this visit.    Review of Systems: No chest pain. No shortness of breath. No urinary complaints.    Physical Exam  Wt Readings from Last 3 Encounters:  12/11/21 227 lb 9.6 oz (103.2 kg)  09/09/21 230 lb (104.3 kg)  06/10/21 228 lb (103.4 kg)    BP 122/80   Pulse 95   Ht _0  (1.626 m)   Wt 226 lb (102.5 kg)   BMI 38.79 kg/m  Constitutional:  Generally well appearing female in no acute distress. Psychiatric: Pleasant. Normal mood and affect. Behavior is normal. EENT: Pupils normal.  Conjunctivae are normal. No scleral icterus. Neck supple.  Cardiovascular: Normal rate, regular rhythm. No edema Pulmonary/chest: Effort normal and breath sounds normal. No wheezing, rales or rhonchi. Abdominal: Soft, nondistended, nontender. Bowel sounds active throughout. There are no masses palpable. No hepatomegaly. Neurological: Alert and oriented to person place and time. Skin: Skin is warm and dry. No rashes noted.  Tye Savoy, NP  12/11/2021, 1:30 PM

## 2021-12-11 NOTE — Patient Instructions (Signed)
_______________________________________________________  If you are age 67 or older, your body mass index should be between 23-30. Your Body mass index is 38.79 kg/m. If this is out of the aforementioned range listed, please consider follow up with your Primary Care Provider. ________________________________________________________  The [facility] providers would like to encourage you to use [facility] to communicate with providers for non-urgent requests or questions.  Due to long hold times on the telephone, sending your provider a message by [facility] may be a faster and more efficient way to get a response.  Please allow 48 business hours for a response.  Please remember that this is for non-urgent requests.  _______________________________________________________  Please try the Carafate before meal up to three times daily.  Thank you for entrusting me with your care and choosing [facility].  Tye Savoy, NP

## 2021-12-11 NOTE — Progress Notes (Signed)
Agree with assessment and plan as outlined.  Difficult situation.  May need to consider repeat EGD if symptoms persist although poor surgical candidate.

## 2021-12-11 NOTE — Progress Notes (Signed)
Appointment cancelled, encounter is an error

## 2021-12-15 ENCOUNTER — Other Ambulatory Visit: Payer: Self-pay | Admitting: Orthopaedic Surgery

## 2021-12-17 ENCOUNTER — Other Ambulatory Visit: Payer: Self-pay | Admitting: Gastroenterology

## 2021-12-17 ENCOUNTER — Ambulatory Visit (INDEPENDENT_AMBULATORY_CARE_PROVIDER_SITE_OTHER): Payer: Medicare Other | Admitting: Podiatry

## 2021-12-17 ENCOUNTER — Encounter: Payer: Self-pay | Admitting: Podiatry

## 2021-12-17 DIAGNOSIS — E1169 Type 2 diabetes mellitus with other specified complication: Secondary | ICD-10-CM

## 2021-12-17 DIAGNOSIS — D689 Coagulation defect, unspecified: Secondary | ICD-10-CM

## 2021-12-17 DIAGNOSIS — M79675 Pain in left toe(s): Secondary | ICD-10-CM | POA: Diagnosis not present

## 2021-12-17 DIAGNOSIS — B351 Tinea unguium: Secondary | ICD-10-CM

## 2021-12-17 DIAGNOSIS — M79674 Pain in right toe(s): Secondary | ICD-10-CM

## 2021-12-17 NOTE — Progress Notes (Signed)
This patient returns to my office for at risk foot care.  This patient requires this care by a professional since this patient will be at risk due to having coagulation defect.  Patient is taking coumadin.  This patient is unable to cut nails herself since the patient cannot reach her nails.These nails are painful walking and wearing shoes.  This patient presents for at risk foot care today.  General Appearance  Alert, conversant and in no acute stress.  Vascular  Dorsalis pedis and posterior tibial  pulses are palpable  bilaterally.  Capillary return is within normal limits  bilaterally. Temperature is within normal limits  bilaterally.  Neurologic  Senn-Weinstein monofilament wire test within normal limits  bilaterally. Muscle power within normal limits bilaterally.  Nails Thick disfigured discolored nails with subungual debris  Second left and first and third right foot.  No evidence of bacterial infection or drainage bilaterally.  Orthopedic  No limitations of motion  feet .  No crepitus or effusions noted.  No bony pathology or digital deformities noted.  Skin  normotropic skin with no porokeratosis noted bilaterally.  No signs of infections or ulcers noted.     Onychomycosis  Pain in right toes  Pain in left toes  Consent was obtained for treatment procedures.   Mechanical debridement of nails 1-5  bilaterally performed with a nail nipper.  Filed with dremel without incident.    Return office visit   9 weeks                  Told patient to return for periodic foot care and evaluation due to potential at risk complications.   Reza Crymes DPM  

## 2021-12-23 NOTE — Patient Instructions (Signed)
DUE TO COVID-19 ONLY TWO VISITORS  (aged 67 and older)  ARE ALLOWED TO COME WITH YOU AND STAY IN THE WAITING ROOM ONLY DURING PRE OP AND PROCEDURE.   **NO VISITORS ARE ALLOWED IN THE SHORT STAY AREA OR RECOVERY ROOM!!**  IF YOU WILL BE ADMITTED INTO THE HOSPITAL YOU ARE ALLOWED ONLY FOUR SUPPORT PEOPLE DURING VISITATION HOURS ONLY (7 AM -8PM)   The support person(s) must pass our screening, gel in and out, and wear a mask at all times, including in the patient's room. Patients must also wear a mask when staff or their support person are in the room. Visitors GUEST BADGE MUST BE WORN VISIBLY  One adult visitor may remain with you overnight and MUST be in the room by 8 P.M.     Your procedure is scheduled on: 01/06/22   Report to [facility] Main Entrance    Report to admitting at : 5:15 AM   Call this number if you have problems the morning of surgery [phone]   Do not eat food :After Midnight.   After Midnight you may have the following liquids until : 4:30 AM DAY OF SURGERY  Water Black Coffee (sugar ok, NO MILK/CREAM OR CREAMERS)  Tea (sugar ok, NO MILK/CREAM OR CREAMERS) regular and decaf                             Plain Jell-O (NO RED)                                           Fruit ices (not with fruit pulp, NO RED)                                     Popsicles (NO RED)                                                                  Juice: apple, WHITE grape, WHITE cranberry Sports drinks like Gatorade (NO RED)              Drink G2 drink AT:  4:30 AM the day of surgery.      The day of surgery:  Drink ONE (1) Pre-Surgery Clear Ensure or G2 at AM the morning of surgery. Drink in one sitting. Do not sip.  This drink was given to you during your hospital  pre-op appointment visit. Nothing else to drink after completing the  Pre-Surgery Clear Ensure or G2.          If you have questions, please contact your surgeon's office.   Oral Hygiene is also important  to reduce your risk of infection.                                    Remember - BRUSH YOUR TEETH THE MORNING OF SURGERY WITH YOUR REGULAR TOOTHPASTE   Do NOT smoke after Midnight   Take these medicines the morning of surgery with A  SIP OF WATER: cetirizine,verapamil,omeprazole.Use Flonase and inhalers as usual.  Bring CPAP mask and tubing day of surgery.                              You may not have any metal on your body including hair pins, jewelry, and body piercing             Do not wear make-up, lotions, powders, perfumes/cologne, or deodorant  Do not wear nail polish including gel and S&S, artificial/acrylic nails, or any other type of covering on natural nails including finger and toenails. If you have artificial nails, gel coating, etc. that needs to be removed by a nail salon please have this removed prior to surgery or surgery may need to be canceled/ delayed if the surgeon/ anesthesia feels like they are unable to be safely monitored.   Do not shave  48 hours prior to surgery.    Do not bring valuables to the hospital. [place].   Contacts, dentures or bridgework may not be worn into surgery.   Bring small overnight bag day of surgery.   DO NOT [place]. PHARMACY WILL DISPENSE MEDICATIONS LISTED ON YOUR MEDICATION LIST TO YOU DURING YOUR ADMISSION [place]!    Patients discharged on the day of surgery will not be allowed to drive home.  Someone NEEDS to stay with you for the first 24 hours after anesthesia.   Special Instructions: Bring a copy of your healthcare power of attorney and living will documents         the day of surgery if you haven't scanned them before.              Please read over the following fact sheets you were given: IF YOU HAVE QUESTIONS ABOUT YOUR PRE-OP INSTRUCTIONS PLEASE CALL [phone]     [facility] - Preparing for Surgery Before surgery, you can  play an important role.  Because skin is not sterile, your skin needs to be as free of germs as possible.  You can reduce the number of germs on your skin by washing with CHG (chlorahexidine gluconate) soap before surgery.  CHG is an antiseptic cleaner which kills germs and bonds with the skin to continue killing germs even after washing. Please DO NOT use if you have an allergy to CHG or antibacterial soaps.  If your skin becomes reddened/irritated stop using the CHG and inform your nurse when you arrive at Short Stay. Do not shave (including legs and underarms) for at least 48 hours prior to the first CHG shower.  You may shave your face/neck. Please follow these instructions carefully:  1.  Shower with CHG Soap the night before surgery and the  morning of Surgery.  2.  If you choose to wash your hair, wash your hair first as usual with your  normal  shampoo.  3.  After you shampoo, rinse your hair and body thoroughly to remove the  shampoo.                           4.  Use CHG as you would any other liquid soap.  You can apply chg directly  to the skin and wash  Gently with a scrungie or clean washcloth.  5.  Apply the CHG Soap to your body ONLY FROM THE NECK DOWN.   Do not use on face/ open                           Wound or open sores. Avoid contact with eyes, ears mouth and genitals (private parts).                       Wash face,  Genitals (private parts) with your normal soap.             6.  Wash thoroughly, paying special attention to the area where your surgery  will be performed.  7.  Thoroughly rinse your body with warm water from the neck down.  8.  DO NOT shower/wash with your normal soap after using and rinsing off  the CHG Soap.                9.  Pat yourself dry with a clean towel.            10.  Wear clean pajamas.            11.  Place clean sheets on your bed the night of your first shower and do not  sleep with pets. Day of Surgery : Do not apply any  lotions/deodorants the morning of surgery.  Please wear clean clothes to the hospital/surgery center.  FAILURE TO FOLLOW THESE INSTRUCTIONS MAY RESULT IN THE CANCELLATION OF YOUR SURGERY PATIENT SIGNATURE_________________________________  NURSE SIGNATURE__________________________________  ________________________________________________________________________   [name]  An incentive spirometer is a tool that can help keep your lungs clear and active. This tool measures how well you are filling your lungs with each breath. Taking long deep breaths may help reverse or decrease the chance of developing breathing (pulmonary) problems (especially infection) following: A long period of time when you are unable to move or be active. BEFORE THE PROCEDURE  If the spirometer includes an indicator to show your best effort, your nurse or respiratory therapist will set it to a desired goal. If possible, sit up straight or lean slightly forward. Try not to slouch. Hold the incentive spirometer in an upright position. INSTRUCTIONS FOR USE  Sit on the edge of your bed if possible, or sit up as far as you can in bed or on a chair. Hold the incentive spirometer in an upright position. Breathe out normally. Place the mouthpiece in your mouth and seal your lips tightly around it. Breathe in slowly and as deeply as possible, raising the piston or the ball toward the top of the column. Hold your breath for 3-5 seconds or for as long as possible. Allow the piston or ball to fall to the bottom of the column. Remove the mouthpiece from your mouth and breathe out normally. Rest for a few seconds and repeat Steps 1 through 7 at least 10 times every 1-2 hours when you are awake. Take your time and take a few normal breaths between deep breaths. The spirometer may include an indicator to show your best effort. Use the indicator as a goal to work toward during each repetition. After each set of 10 deep  breaths, practice coughing to be sure your lungs are clear. If you have an incision (the cut made at the time of surgery), support your incision when coughing by placing a pillow or rolled up towels  firmly against it. Once you are able to get out of bed, walk around indoors and cough well. You may stop using the incentive spirometer when instructed by your caregiver.  RISKS AND COMPLICATIONS Take your time so you do not get dizzy or light-headed. If you are in pain, you may need to take or ask for pain medication before doing incentive spirometry. It is harder to take a deep breath if you are having pain. AFTER USE Rest and breathe slowly and easily. It can be helpful to keep track of a log of your progress. Your caregiver can provide you with a simple table to help with this. If you are using the spirometer at home, follow these instructions: [place] IF:  You are having difficultly using the spirometer. You have trouble using the spirometer as often as instructed. Your pain medication is not giving enough relief while using the spirometer. You develop fever of 100.5 F (38.1 C) or higher. SEEK IMMEDIATE MEDICAL CARE IF:  You cough up bloody sputum that had not been present before. You develop fever of 102 F (38.9 C) or greater. You develop worsening pain at or near the incision site. MAKE SURE YOU:  Understand these instructions. Will watch your condition. Will get help right away if you are not doing well or get worse. Document Released: 07/20/2006 Document Revised: 06/01/2011 Document Reviewed: 09/20/2006 [facility] Patient Information 2014 [place], [place].   ________________________________________________________________________

## 2021-12-24 ENCOUNTER — Other Ambulatory Visit: Payer: Self-pay

## 2021-12-24 ENCOUNTER — Ambulatory Visit (INDEPENDENT_AMBULATORY_CARE_PROVIDER_SITE_OTHER): Payer: Medicare Other | Admitting: *Deleted

## 2021-12-24 ENCOUNTER — Encounter (HOSPITAL_COMMUNITY)
Admission: RE | Admit: 2021-12-24 | Discharge: 2021-12-24 | Disposition: A | Discharge status: Home / Self Care | Payer: Medicare Other | Source: Ambulatory Visit | Attending: Orthopaedic Surgery | Admitting: Orthopaedic Surgery

## 2021-12-24 ENCOUNTER — Encounter (HOSPITAL_COMMUNITY): Payer: Self-pay

## 2021-12-24 ENCOUNTER — Ambulatory Visit (HOSPITAL_COMMUNITY)
Admission: RE | Admit: 2021-12-24 | Discharge: 2021-12-24 | Disposition: A | Discharge status: Home / Self Care | Payer: Medicare Other | Source: Ambulatory Visit | Attending: Orthopaedic Surgery | Admitting: Orthopaedic Surgery

## 2021-12-24 VITALS — BP 145/91 | HR 77 | Temp 98.2°F | Ht 64.0 in | Wt 224.0 lb

## 2021-12-24 DIAGNOSIS — Z5181 Encounter for therapeutic drug level monitoring: Secondary | ICD-10-CM | POA: Insufficient documentation

## 2021-12-24 DIAGNOSIS — E1169 Type 2 diabetes mellitus with other specified complication: Secondary | ICD-10-CM | POA: Insufficient documentation

## 2021-12-24 DIAGNOSIS — I480 Paroxysmal atrial fibrillation: Secondary | ICD-10-CM

## 2021-12-24 DIAGNOSIS — Z01818 Encounter for other preprocedural examination: Secondary | ICD-10-CM | POA: Diagnosis present

## 2021-12-24 DIAGNOSIS — I4891 Unspecified atrial fibrillation: Secondary | ICD-10-CM | POA: Diagnosis not present

## 2021-12-24 DIAGNOSIS — Z01810 Encounter for preprocedural cardiovascular examination: Secondary | ICD-10-CM

## 2021-12-24 DIAGNOSIS — Z7901 Long term (current) use of anticoagulants: Secondary | ICD-10-CM | POA: Diagnosis not present

## 2021-12-24 DIAGNOSIS — I10 Essential (primary) hypertension: Secondary | ICD-10-CM | POA: Insufficient documentation

## 2021-12-24 LAB — POCT INR: INR: 1.6 — AB (ref 2.0–3.0)

## 2021-12-24 LAB — BASIC METABOLIC PANEL
Anion gap: 8 (ref 5–15)
BUN: 12 mg/dL (ref 8–23)
CO2: 26 mmol/L (ref 22–32)
Calcium: 9.3 mg/dL (ref 8.9–10.3)
Chloride: 101 mmol/L (ref 98–111)
Creatinine, Ser: 0.67 mg/dL (ref 0.44–1.00)
GFR, Estimated: >60 mL/min (ref >60)
Glucose, Bld: 255 mg/dL — ABNORMAL HIGH (ref 70–99)
Potassium: 3.9 mmol/L (ref 3.5–5.1)
Sodium: 135 mmol/L (ref 135–145)

## 2021-12-24 LAB — CBC
HCT: 41.7 % (ref 36.0–46.0)
Hemoglobin: 13.8 g/dL (ref 12.0–15.0)
MCH: 30.3 pg (ref 26.0–34.0)
MCHC: 33.1 g/dL (ref 30.0–36.0)
MCV: 91.6 fL (ref 80.0–100.0)
Platelets: 339 10*3/uL (ref 150–400)
RBC: 4.55 MIL/uL (ref 3.87–5.11)
RDW: 13 % (ref 11.5–15.5)
WBC: 6.1 10*3/uL (ref 4.0–10.5)
nRBC: 0 % (ref 0.0–0.2)

## 2021-12-24 LAB — GLUCOSE, CAPILLARY: Glucose-Capillary: 286 mg/dL — ABNORMAL HIGH (ref 70–99)

## 2021-12-24 LAB — HEMOGLOBIN A1C
Hgb A1c MFr Bld: 7.2 % — ABNORMAL HIGH (ref 4.8–5.6)
Mean Plasma Glucose: 159.94 mg/dL

## 2021-12-24 LAB — SURGICAL PCR SCREEN
MRSA, PCR: NEGATIVE
Staphylococcus aureus: NEGATIVE

## 2021-12-24 MED ORDER — ENOXAPARIN SODIUM 100 MG/ML IJ SOSY: 100.0000 mg | PREFILLED_SYRINGE | Freq: Two times a day (BID) | INTRAMUSCULAR | 1 refills | Status: DC

## 2021-12-24 NOTE — Patient Instructions (Addendum)
Description   Today take 3 tablets then continue taking Warfarin '4mg'$  daily except '6mg'$  on Tuesdays and Saturdays. Recheck INR in 1 week post procedure. Call [facility] [phone] Main # [phone] with any questions       10/11: Last dose of warfarin.  10/12: No warfarin or enoxaparin (Lovenox).  10/13: Inject enoxaparin '100mg'$  in the fatty abdominal tissue at least 2 inches from the belly button twice a day about 12 hours apart, 8am and 8pm rotate sites. No warfarin.  10/14: Inject enoxaparin in the fatty tissue every 12 hours, 8am and 8pm. No warfarin.  10/15: Inject enoxaparin in the fatty tissue every 12 hours, 8am and 8pm. No warfarin.  10/16: Inject enoxaparin in the fatty tissue in the morning at 8 am (No PM dose). No warfarin.  10/17: Procedure Day - No enoxaparin - Resume warfarin in the evening or as directed by doctor (take an extra half tablet with usual dose today and tomorrow).   10/18: Resume enoxaparin inject in the fatty tissue every 12 hours and take warfarin (take an extra half tablet with usual dose today then resume normal dose).  10/19: Inject enoxaparin in the fatty tissue every 12 hours and take warfarin  10/20: Inject enoxaparin in the fatty tissue every 12 hours and take warfarin  10/21: Inject enoxaparin in the fatty tissue every 12 hours and take warfarin  10/22: Inject enoxaparin in the fatty tissue every 12 hours and take warfarin  10/23: Inject enoxaparin in the fatty tissue at 8am then report to warfarin appt to check INR.

## 2021-12-24 NOTE — Progress Notes (Signed)
For Short Stay: [place] appointment date: Date of COVID positive in last 80 days:  Bowel Prep reminder:   For Anesthesia: PCP - Dr. Antony Contras Cardiologist - Dr. Buford Dresser. Clearance: Richardson Dopp: PAC: 10/29/21: EPIC Chest x-ray - 03/29/21 EKG - 09/09/21 Stress Test -  ECHO - 03/14/21 Cardiac Cath - 2008 Pacemaker/ICD device last checked: Pacemaker orders received: Device Rep notified:  Spinal Cord Stimulator:  Sleep Study - Yes CPAP - NO  Fasting Blood Sugar - N/A Checks Blood Sugar _____ times a day Date and result of last Hgb A1c-  Blood Thinner Instructions: As per cardiologist note,coumadin can be stop 5 days before surgery and should be bridge with lovenox.Pt. is going today to the coumadin clinic for instructions. Aspirin Instructions: Last Dose:  Activity level: Can go up a flight of stairs and activities of daily living without stopping and without chest pain and/or shortness of breath   Able to exercise without chest pain and/or shortness of breath   Unable to go up a flight of stairs without chest pain and/or shortness of breath     Anesthesia review: Hx: HTN,DVT,Afib,Heart murmur,OSA(NO CPAP),Pre-DIA.  Patient denies shortness of breath, fever, cough and chest pain at PAT appointment   Patient verbalized understanding of instructions that were given to them at the PAT appointment. Patient was also instructed that they will need to review over the PAT instructions again at home before surgery.

## 2021-12-25 NOTE — Care Plan (Signed)
Ortho Bundle Case Management Note  Patient Details  Name: [name] MRN: [id number] Date of Birth: 1955/09/14   Spoke with patient prior to surgery. She will discharge to home with family to assist. Has rolling walker. OPPT set up with [place]. Discharge instructions discussed and mailed to patient. Patient and MD in agreement with plan. Choice offered                   DME Arranged:    DME Agency:     HH Arranged:    HH Agency:     Additional Comments: Please contact me with any questions of if this plan should need to change.  Ladell Heads,  [place] Specialist  [phone] 12/25/2021, 12:12 PM

## 2022-01-05 MED ORDER — TRANEXAMIC ACID 1000 MG/10ML IV SOLN
2000.0000 mg | INTRAVENOUS | Status: DC
  Filled 2022-01-05: qty 20

## 2022-01-05 NOTE — H&P (Signed)
TOTAL KNEE ADMISSION H&P  Patient is being admitted for right total knee arthroplasty.  Subjective:  Chief Complaint:right knee pain.  HPI: [name], 66 y.o. female, has a history of pain and functional disability in the right knee due to arthritis and has failed non-surgical conservative treatments for greater than 12 weeks to includeNSAID's and/or analgesics, corticosteriod injections, flexibility and strengthening excercises, supervised PT with diminished ADL's post treatment, use of assistive devices, weight reduction as appropriate, and activity modification.  Onset of symptoms was gradual, starting 5 years ago with gradually worsening course since that time. The patient noted prior procedures on the knee to include  arthroscopy on the right knee(s).  Patient currently rates pain in the right knee(s) at 10 out of 10 with activity. Patient has night pain, worsening of pain with activity and weight bearing, pain that interferes with activities of daily living, crepitus, and joint swelling.  Patient has evidence of subchondral cysts, subchondral sclerosis, periarticular osteophytes, and joint space narrowing by imaging studies. There is no active infection.  Patient Active Problem List   Diagnosis Date Noted   Secondary hypercoagulable state ([place]) 05/27/2021   Exertional chest pain 03/29/2021   Angina pectoris ([place]) 03/29/2021   Allergic rhinitis 04/17/2020   Chronic sinusitis 04/17/2020   Edema 04/17/2020   Mixed hyperlipidemia 04/17/2020   Morbid obesity ([place]) 04/17/2020   Sciatica 04/17/2020   Skin sensation disturbance 04/17/2020   Thrombophilia ([place]) 04/17/2020   Type 2 diabetes mellitus with other specified complication ([place]) 09/32/3557   Abdominal pain, left lower quadrant 10/27/2018   Loose stools 10/27/2018   Iron deficiency anemia due to chronic blood loss 04/14/2018   Iron malabsorption 04/14/2018   GIB (gastrointestinal bleeding) 04/14/2018   Chronic anticoagulation  (warfarin for Afib) 08/06/2017   Gastric polyp    Fall 07/08/2017   Scalp laceration 07/08/2017   Hypokalemia 07/08/2017   Numbness of hand 06/07/2017   Recurrent diverticulitis s/p robotic sigmoid colectomy 08/06/2017 11/24/2016   Right hip pain 05/14/2015   Obesity (BMI 30-39.9) 01/10/2014   Sleep apnea 01/10/2014   Preoperative cardiovascular examination 04/26/2013   Essential hypertension 03/01/2013   History of DVT (deep vein thrombosis) 01/12/2013   Hyperglycemia 01/12/2013   Dizziness 07/09/2010   Carpal tunnel syndrome 09/10/2009   Asthma 09/10/2009   TMJ SYNDROME 09/10/2009   GERD 09/10/2009   GALLBLADDER DISEASE 09/10/2009   Paroxysmal atrial fibrillation ([place]) 09/09/2009   Past Medical History:  Diagnosis Date   Allergic rhinitis    Anginal pain ([place])    Arthritis    "back" (05/07/2016)   Asthma    Atrial flutter ([place])    a. Remotely per notes.   Carpal tunnel syndrome, bilateral    Colon polyps    Diverticulitis    Dizziness    DVT (deep venous thrombosis) ([place]) 1980s x2 -    between birth of two sons. Saw hematology - was told she has a hypercoagulable disorder, should be on Coumadin lifelong.   Dysrhythmia    atrial fib   Family history of adverse reaction to anesthesia    "mother gets PONV"   Gallbladder disease    GERD (gastroesophageal reflux disease)    GIB (gastrointestinal bleeding) 04/14/2018   Halothane adverse reaction    narrow small opening   Heart murmur    Hiatal hernia    History of hiatal hernia    "small one/CTA 05/01/2016" (05/07/2016)   History of kidney stones    HTN (hypertension)    Hypercholesteremia  hx; "brought it down w/diet" (05/07/2016)   Hyperglycemia    a. A1c 6.1 in 2014.   Inguinal hernia    Iron deficiency anemia due to chronic blood loss 04/14/2018   Iron malabsorption 04/14/2018   Left sided sciatica    Normal cardiac stress test 06/2012   Obesity    OSA (obstructive sleep apnea)    a. Mild, did not tolerate  CPAP (05/07/2016)   PAF (paroxysmal atrial fibrillation) ([place])    a. s/p afib ablation at [facility] 2010 (Dr. Annabell Howells). b. Recurrent AF s/p DCCV 06/2010. c. On flecainide.    Paroxysmal atrial fibrillation ([place]) 09/09/2009   Qualifier: Diagnosis of  By: Selena Batten CMA, [place]     PONV (postoperative nausea and vomiting)    PPD positive, treated 1977   "treated for 1 yr after exposure to patient"   Pre-diabetes    RSV infection ~ 2006   Vertigo    Wandering (atrial) pacemaker    a. Remotely per notes.  (  pt. states has a wandering p wave ) no pacemaker    Past Surgical History:  Procedure Laterality Date   ANKLE ARTHODESIS W/ ARTHROSCOPY Right 11/12/2020   ATRIAL ABLATION SURGERY  05/07/2016   "fib and flutter"   ATRIAL FIBRILLATION ABLATION  2010   at [facility] N/A 05/07/2016   Procedure: Atrial Fibrillation Ablation;  Surgeon: Thompson Grayer, MD;  Location: [facility];  Service: Cardiovascular;  Laterality: N/A;   BREAST CYST ASPIRATION Bilateral    CARDIAC CATHETERIZATION  2008   CARPAL TUNNEL RELEASE Bilateral    CATARACT EXTRACTION Bilateral 2022   CESAREAN SECTION  1986; 1988   COLON RESECTION     COLONOSCOPY  X 2   COLONOSCOPY W/ BIOPSIES AND POLYPECTOMY  X 2   CYSTOSCOPY/URETEROSCOPY/HOLMIUM LASER/STENT PLACEMENT Left 12/23/2019   Procedure: CYSTOSCOPY/URETEROSCOPY/HOLMIUM LASER/STENT PLACEMENT;  Surgeon: Robley Fries, MD;  Location: [facility];  Service: Urology;  Laterality: Left;   ESOPHAGOGASTRODUODENOSCOPY     ESOPHAGOGASTRODUODENOSCOPY (EGD) WITH PROPOFOL N/A 07/19/2017   Procedure: ESOPHAGOGASTRODUODENOSCOPY (EGD) WITH PROPOFOL;  Surgeon: Yetta Flock, MD;  Location: [facility];  Service: Gastroenterology;  Laterality: N/A;   INGUINAL HERNIA REPAIR Right    KNEE ARTHROSCOPY Right 06/10/2021   Procedure: RIGHT KNEE ARTHROSCOPY;  Surgeon: Melrose Nakayama, MD;  Location: [facility];  Service: Orthopedics;  Laterality: Right;    [place]   POLYPECTOMY N/A 07/19/2017   Procedure: POLYPECTOMY;  Surgeon: Yetta Flock, MD;  Location: [facility];  Service: Gastroenterology;  Laterality: N/A;   PROCTOSCOPY N/A 08/06/2017   Procedure: PROCTOSCOPY;  Surgeon: Michael Boston, MD;  Location: [facility];  Service: General;  Laterality: N/A;   TMJ ARTHROPLASTY     TUBAL LIGATION  1988    Current Facility-Administered Medications  Medication Dose Route Frequency Provider Last Rate Last Admin   [START ON 01/06/2022] tranexamic acid (CYKLOKAPRON) 2,000 mg in sodium chloride 0.9 % 50 mL Topical Application  5,400 mg Topical On Call to OR Melrose Nakayama, MD       Current Outpatient Medications  Medication Sig Dispense Refill Last Dose   APPLE CIDER VINEGAR PO Take 450 mg by mouth in the morning.      cetirizine (ZYRTEC) 10 MG tablet Take 10 mg by mouth in the morning.      Cholecalciferol (VITAMIN D3 PO) Take 2,000 Units by mouth in the morning.      dicyclomine (BENTYL) 10 MG capsule TAKE  1 CAPSULE BY MOUTH 30 MINS BEFORE MEALS AND AT BEDTIME AS NEEDED FOR PAIN/CRAMPING 360 capsule 1    ezetimibe (ZETIA) 10 MG tablet TAKE 1 TABLET BY MOUTH EVERY DAY 90 tablet 3    FLOVENT HFA 220 MCG/ACT inhaler Inhale 1 puff into the lungs 2 (two) times daily.  1    fluticasone (FLONASE) 50 MCG/ACT nasal spray Place 1 spray into both nostrils 2 (two) times daily.       isosorbide mononitrate (IMDUR) 30 MG 24 hr tablet TAKE 1 TABLET BY MOUTH EVERY DAY (Patient taking differently: Take 30 mg by mouth every evening.) 90 tablet 3    losartan (COZAAR) 25 MG tablet Take 1 tablet (25 mg total) by mouth daily. 90 tablet 0    meclizine (ANTIVERT) 25 MG tablet Take 1 tablet (25 mg total) by mouth 3 (three) times daily as needed for dizziness. 30 tablet 0    Multiple Vitamin (MULTIVITAMIN WITH MINERALS) TABS tablet Take 1 tablet by mouth in the morning.      nitroGLYCERIN (NITROSTAT) 0.4 MG SL tablet PLACE 1 TABLET UNDER THE  TONGUE EVERY 5 MINUTES AS NEEDED. 25 tablet 11    omeprazole (PRILOSEC) 40 MG capsule Take 1 capsule (40 mg total) by mouth 2 (two) times daily. Decrease to one daily after 6 weeks if symptoms are maintained. 180 capsule 0    polyethylene glycol (MIRALAX) 17 g packet Take 17 g by mouth 2 (two) times daily. Titrate as needed (Patient taking differently: Take 17 g by mouth 2 (two) times daily as needed (constipation.).) 14 each 0    PROAIR HFA 108 (90 BASE) MCG/ACT inhaler Inhale 2 puffs into the lungs every 6 (six) hours as needed for wheezing or shortness of breath.       sucralfate (CARAFATE) 1 g tablet Take 1 tablet before meals up to 3 times daily. (Patient taking differently: Take 1 g by mouth 3 (three) times daily as needed (stomach issues).)      traMADol (ULTRAM) 50 MG tablet Take 50 mg by mouth 3 (three) times daily as needed (pain.).      venlafaxine XR (EFFEXOR-XR) 37.5 MG 24 hr capsule Take 37.5 mg by mouth daily with breakfast.       verapamil (CALAN) 80 MG tablet Take 160 mg by mouth 2 (two) times daily.      warfarin (COUMADIN) 2 MG tablet TAKE '4MG'$  DAILY EXCEPT '6MG'$  ON TUESDAYS, THURSDAYS,AND SATURDAYS AS DIRECTED BY ANTICOAGULATION CLINIC (Patient taking differently: Take 4-6 mg by mouth See admin instructions. Take 3 tablets (6 mg) by mouth on Tuesdays & Saturdays in the evening.  Take 2 tablets (3 mg) by mouth on Sundays, Mondays, Wednesdays, Thursdays & Fridays in the evening.) 225 tablet 1    acetaminophen (TYLENOL) 650 MG CR tablet Take 1,300 mg by mouth every 8 (eight) hours as needed for pain.       enoxaparin (LOVENOX) 100 MG/ML injection Inject 1 mL (100 mg total) into the skin every 12 (twelve) hours. As instructed by Anticoagulation Clinic 10 mL 1    Allergies  Allergen Reactions   Aspirin Other (See Comments)    Makes asthma worse and makes stomach hurt   Banana Swelling and Cough    Inside of the mouth swells   Food Other (See Comments) and Cough    Nuts- mucus  membranes inflamed/cough    Iodinated Contrast Media Anaphylaxis    Required epinephrine. Since then, has tolerated with premed. Other reaction(s): wheeze...requires premdication  with prednisone 3 days in advance   Latex Hives and Cough    Wheezing/cough  Other reaction(s): hives/wheeze   Meperidine Hcl Other (See Comments)    Projectile vomiting    Peanut-Containing Drug Products Swelling and Cough    Inside of the mouth swells   Penicillins Anaphylaxis    Has patient had a PCN reaction causing immediate rash, facial/tongue/throat swelling, SOB or lightheadedness with hypotension:Yes Has patient had a PCN reaction causing severe rash involving mucus membranes or skin necrosis:Yes Has patient had a PCN reaction that required hospitalization:Treated in ER w/epi & breathing treatments Has patient had a PCN reaction occurring within the last 10 years:No If all of the above answers are "NO", then may proceed with Cephalosporin use.    Statins Other (See Comments)    Severe leg pain - has tried most of them   Sulfonamide Derivatives Anaphylaxis   Tape Hives and Dermatitis    No plastic tape!!   Alfuzosin Other (See Comments)    Headache, dizziness   Halothane     Listed under anesthesia adverse reactions pt does not know what reaction or if any at preop on 05/26/21.    Other Other (See Comments)    Vascepa- upset stomach   Sulfamethoxazole Hives    Social History   Tobacco Use   Smoking status: Never    Passive exposure: Past   Smokeless tobacco: Never  Substance Use Topics   Alcohol use: Not Currently    Comment: rare    Family History  Problem Relation Age of Onset   Hypertension Mother    Stroke Mother    Heart attack Mother        2 previous MIs, 3 stents - CAD first diagnosed 26   Thyroid disease Mother    Hyperlipidemia Father    Colon polyps Father    Lupus Sister    Thyroid disease Sister        x 2   Hyperlipidemia Maternal Grandmother    Heart attack  Maternal Grandmother    Diabetes Maternal Grandmother    Hypertension Maternal Grandmother    Hypertension Maternal Grandfather    Stroke Maternal Grandfather        Multiple family members   Stroke Paternal Grandmother    Heart attack Paternal Grandfather        Died at 48 of massive MI   Colon cancer Neg Hx    Stomach cancer Neg Hx      Review of Systems  Musculoskeletal:  Positive for arthralgias.       Right knee  All other systems reviewed and are negative.   Objective:  Physical Exam Constitutional:      Appearance: Normal appearance.  HENT:     Head: Normocephalic and atraumatic.     Nose: Nose normal.     Mouth/Throat:     Pharynx: Oropharynx is clear.  Eyes:     Extraocular Movements: Extraocular movements intact.  Pulmonary:     Effort: Pulmonary effort is normal.  Abdominal:     Palpations: Abdomen is soft.  Musculoskeletal:     Cervical back: Normal range of motion.     Comments: Right knee motion remains about 0-120.  She has pain along the medial joint line She has crepitation but no effusion.  Hip motion is full with no pain on straight leg raise.  Sensation and motor function are intact in her feet with palpable pulses on both sides.    Neurological:  General: No focal deficit present.     Mental Status: She is alert and oriented to person, place, and time. Mental status is at baseline.  Psychiatric:        Mood and Affect: Mood normal.        Behavior: Behavior normal.        Thought Content: Thought content normal.        Judgment: Judgment normal.     Vital signs in last 24 hours:    Labs:   Estimated body mass index is 38.45 kg/m as calculated from the following:   Height as of 12/24/21: '5\' 4"'$  (1.626 m).   Weight as of 12/24/21: 101.6 kg.   Imaging Review Plain radiographs demonstrate severe degenerative joint disease of the right knee(s). The overall alignment isneutral. The bone quality appears to be good for age and reported  activity level.      Assessment/Plan:  End stage primary arthritis, right knee   The patient history, physical examination, clinical judgment of the provider and imaging studies are consistent with end stage degenerative joint disease of the right knee(s) and total knee arthroplasty is deemed medically necessary. The treatment options including medical management, injection therapy arthroscopy and arthroplasty were discussed at length. The risks and benefits of total knee arthroplasty were presented and reviewed. The risks due to aseptic loosening, infection, stiffness, patella tracking problems, thromboembolic complications and other imponderables were discussed. The patient acknowledged the explanation, agreed to proceed with the plan and consent was signed. Patient is being admitted for inpatient treatment for surgery, pain control, PT, OT, prophylactic antibiotics, VTE prophylaxis, progressive ambulation and ADL's and discharge planning. The patient is planning to be discharged home with home health services     Patient's anticipated LOS is less than 2 midnights, meeting these requirements: - Younger than 77 - Lives within 1 hour of care - Has a competent adult at home to recover with post-op recover - NO history of  - Chronic pain requiring opiods  - Diabetes  - Coronary Artery Disease  - Heart failure  - Heart attack  - Stroke  - DVT/VTE  - Cardiac arrhythmia  - Respiratory Failure/COPD  - Renal failure  - Anemia  - Advanced Liver disease

## 2022-01-06 ENCOUNTER — Encounter (HOSPITAL_COMMUNITY)
Admission: RE | Disposition: A | Discharge status: Home Health | Payer: Self-pay | Source: Home / Self Care | Attending: Orthopaedic Surgery

## 2022-01-06 ENCOUNTER — Ambulatory Visit (HOSPITAL_COMMUNITY): Payer: Medicare Other | Admitting: Physician Assistant

## 2022-01-06 ENCOUNTER — Ambulatory Visit (HOSPITAL_BASED_OUTPATIENT_CLINIC_OR_DEPARTMENT_OTHER): Payer: Medicare Other | Admitting: Anesthesiology

## 2022-01-06 ENCOUNTER — Inpatient Hospital Stay (HOSPITAL_COMMUNITY)
Admission: RE | Admit: 2022-01-06 | Discharge: 2022-01-10 | DRG: 470 | Disposition: A | Discharge status: Home Health | Payer: Medicare Other | Attending: Orthopaedic Surgery | Admitting: Orthopaedic Surgery

## 2022-01-06 ENCOUNTER — Other Ambulatory Visit: Payer: Self-pay

## 2022-01-06 ENCOUNTER — Encounter (HOSPITAL_COMMUNITY): Payer: Self-pay | Admitting: Orthopaedic Surgery

## 2022-01-06 DIAGNOSIS — M1711 Unilateral primary osteoarthritis, right knee: Secondary | ICD-10-CM

## 2022-01-06 DIAGNOSIS — Z7722 Contact with and (suspected) exposure to environmental tobacco smoke (acute) (chronic): Secondary | ICD-10-CM | POA: Diagnosis not present

## 2022-01-06 DIAGNOSIS — E669 Obesity, unspecified: Secondary | ICD-10-CM | POA: Diagnosis present

## 2022-01-06 DIAGNOSIS — D62 Acute posthemorrhagic anemia: Secondary | ICD-10-CM | POA: Diagnosis present

## 2022-01-06 DIAGNOSIS — Z8249 Family history of ischemic heart disease and other diseases of the circulatory system: Secondary | ICD-10-CM | POA: Diagnosis not present

## 2022-01-06 DIAGNOSIS — G4733 Obstructive sleep apnea (adult) (pediatric): Secondary | ICD-10-CM | POA: Diagnosis present

## 2022-01-06 DIAGNOSIS — J4489 Other specified chronic obstructive pulmonary disease: Secondary | ICD-10-CM | POA: Diagnosis present

## 2022-01-06 DIAGNOSIS — J45909 Unspecified asthma, uncomplicated: Secondary | ICD-10-CM | POA: Diagnosis not present

## 2022-01-06 DIAGNOSIS — Z8349 Family history of other endocrine, nutritional and metabolic diseases: Secondary | ICD-10-CM | POA: Diagnosis not present

## 2022-01-06 DIAGNOSIS — E782 Mixed hyperlipidemia: Secondary | ICD-10-CM | POA: Diagnosis present

## 2022-01-06 DIAGNOSIS — Z8719 Personal history of other diseases of the digestive system: Secondary | ICD-10-CM

## 2022-01-06 DIAGNOSIS — Z87892 Personal history of anaphylaxis: Secondary | ICD-10-CM

## 2022-01-06 DIAGNOSIS — I480 Paroxysmal atrial fibrillation: Secondary | ICD-10-CM | POA: Diagnosis present

## 2022-01-06 DIAGNOSIS — K219 Gastro-esophageal reflux disease without esophagitis: Secondary | ICD-10-CM | POA: Diagnosis present

## 2022-01-06 DIAGNOSIS — I209 Angina pectoris, unspecified: Secondary | ICD-10-CM | POA: Diagnosis present

## 2022-01-06 DIAGNOSIS — Z91041 Radiographic dye allergy status: Secondary | ICD-10-CM

## 2022-01-06 DIAGNOSIS — Z7901 Long term (current) use of anticoagulants: Secondary | ICD-10-CM

## 2022-01-06 DIAGNOSIS — Z86718 Personal history of other venous thrombosis and embolism: Secondary | ICD-10-CM

## 2022-01-06 DIAGNOSIS — Z91048 Other nonmedicinal substance allergy status: Secondary | ICD-10-CM

## 2022-01-06 DIAGNOSIS — Z882 Allergy status to sulfonamides status: Secondary | ICD-10-CM

## 2022-01-06 DIAGNOSIS — Z833 Family history of diabetes mellitus: Secondary | ICD-10-CM

## 2022-01-06 DIAGNOSIS — G8929 Other chronic pain: Secondary | ICD-10-CM | POA: Diagnosis present

## 2022-01-06 DIAGNOSIS — Z888 Allergy status to other drugs, medicaments and biological substances status: Secondary | ICD-10-CM

## 2022-01-06 DIAGNOSIS — E860 Dehydration: Secondary | ICD-10-CM | POA: Diagnosis not present

## 2022-01-06 DIAGNOSIS — Z96651 Presence of right artificial knee joint: Principal | ICD-10-CM

## 2022-01-06 DIAGNOSIS — Z7951 Long term (current) use of inhaled steroids: Secondary | ICD-10-CM | POA: Diagnosis not present

## 2022-01-06 DIAGNOSIS — E1169 Type 2 diabetes mellitus with other specified complication: Secondary | ICD-10-CM | POA: Diagnosis present

## 2022-01-06 DIAGNOSIS — I10 Essential (primary) hypertension: Secondary | ICD-10-CM | POA: Diagnosis present

## 2022-01-06 DIAGNOSIS — Z823 Family history of stroke: Secondary | ICD-10-CM | POA: Diagnosis not present

## 2022-01-06 DIAGNOSIS — Z91018 Allergy to other foods: Secondary | ICD-10-CM

## 2022-01-06 DIAGNOSIS — Z91040 Latex allergy status: Secondary | ICD-10-CM

## 2022-01-06 DIAGNOSIS — Z83438 Family history of other disorder of lipoprotein metabolism and other lipidemia: Secondary | ICD-10-CM | POA: Diagnosis not present

## 2022-01-06 DIAGNOSIS — Z87442 Personal history of urinary calculi: Secondary | ICD-10-CM

## 2022-01-06 DIAGNOSIS — Z886 Allergy status to analgesic agent status: Secondary | ICD-10-CM

## 2022-01-06 DIAGNOSIS — G4730 Sleep apnea, unspecified: Secondary | ICD-10-CM | POA: Diagnosis not present

## 2022-01-06 DIAGNOSIS — Z83719 Family history of colon polyps, unspecified: Secondary | ICD-10-CM

## 2022-01-06 DIAGNOSIS — Z79891 Long term (current) use of opiate analgesic: Secondary | ICD-10-CM

## 2022-01-06 DIAGNOSIS — Z884 Allergy status to anesthetic agent status: Secondary | ICD-10-CM

## 2022-01-06 DIAGNOSIS — Z832 Family history of diseases of the blood and blood-forming organs and certain disorders involving the immune mechanism: Secondary | ICD-10-CM

## 2022-01-06 HISTORY — PX: TOTAL KNEE ARTHROPLASTY: SHX125

## 2022-01-06 LAB — PROTIME-INR
INR: 1 (ref 0.8–1.2)
Prothrombin Time: 13.4 seconds (ref 11.4–15.2)

## 2022-01-06 LAB — GLUCOSE, CAPILLARY: Glucose-Capillary: 166 mg/dL — ABNORMAL HIGH (ref 70–99)

## 2022-01-06 SURGERY — ARTHROPLASTY, KNEE, TOTAL
Anesthesia: Regional | Site: Knee | Laterality: Right

## 2022-01-06 MED ORDER — METHOCARBAMOL 500 MG IVPB - SIMPLE MED
INTRAVENOUS | Status: AC
  Filled 2022-01-06: qty 55

## 2022-01-06 MED ORDER — METOCLOPRAMIDE HCL 5 MG/ML IJ SOLN: 5.0000 mg | Freq: Three times a day (TID) | INTRAMUSCULAR | Status: DC | PRN

## 2022-01-06 MED ORDER — PANTOPRAZOLE SODIUM 40 MG PO TBEC
80.0000 mg | DELAYED_RELEASE_TABLET | Freq: Every day | ORAL | Status: DC
  Administered 2022-01-07 – 2022-01-10 (×4): 80 mg via ORAL
  Filled 2022-01-06 (×4): qty 2

## 2022-01-06 MED ORDER — MIDAZOLAM HCL 2 MG/2ML IJ SOLN
2.0000 mg | INTRAMUSCULAR | Status: DC
  Administered 2022-01-06: 2 mg via INTRAVENOUS
  Filled 2022-01-06: qty 2

## 2022-01-06 MED ORDER — OXYCODONE HCL 5 MG PO TABS
ORAL_TABLET | ORAL | Status: AC
  Administered 2022-01-06: 5 mg via ORAL
  Filled 2022-01-06: qty 1

## 2022-01-06 MED ORDER — ALBUTEROL SULFATE (2.5 MG/3ML) 0.083% IN NEBU: 2.5000 mg | INHALATION_SOLUTION | Freq: Four times a day (QID) | RESPIRATORY_TRACT | Status: DC | PRN

## 2022-01-06 MED ORDER — TRANEXAMIC ACID-NACL 1000-0.7 MG/100ML-% IV SOLN
1000.0000 mg | INTRAVENOUS | Status: AC
  Administered 2022-01-06: 1000 mg via INTRAVENOUS
  Filled 2022-01-06: qty 100

## 2022-01-06 MED ORDER — ROPIVACAINE HCL 5 MG/ML IJ SOLN
INTRAMUSCULAR | Status: DC | PRN
  Administered 2022-01-06: 20 mL via PERINEURAL

## 2022-01-06 MED ORDER — BUPIVACAINE-EPINEPHRINE (PF) 0.25% -1:200000 IJ SOLN
INTRAMUSCULAR | Status: AC
  Filled 2022-01-06: qty 30

## 2022-01-06 MED ORDER — TRAMADOL HCL 50 MG PO TABS: 50.0000 mg | ORAL_TABLET | Freq: Three times a day (TID) | ORAL | 0 refills | Status: DC | PRN

## 2022-01-06 MED ORDER — BUDESONIDE 0.5 MG/2ML IN SUSP
0.5000 mg | Freq: Two times a day (BID) | RESPIRATORY_TRACT | Status: DC
  Administered 2022-01-06 – 2022-01-09 (×6): 0.5 mg via RESPIRATORY_TRACT
  Filled 2022-01-06 (×8): qty 2

## 2022-01-06 MED ORDER — OXYCODONE HCL 5 MG/5ML PO SOLN: 5.0000 mg | Freq: Once | ORAL | Status: AC | PRN

## 2022-01-06 MED ORDER — OXYCODONE HCL 5 MG PO TABS
10.0000 mg | ORAL_TABLET | ORAL | Status: DC | PRN
  Administered 2022-01-06: 10 mg via ORAL
  Administered 2022-01-07 (×2): 15 mg via ORAL
  Administered 2022-01-07: 10 mg via ORAL
  Administered 2022-01-07: 15 mg via ORAL
  Administered 2022-01-07: 10 mg via ORAL
  Administered 2022-01-08 – 2022-01-10 (×11): 15 mg via ORAL
  Filled 2022-01-06 (×10): qty 3
  Filled 2022-01-06: qty 2
  Filled 2022-01-06 (×5): qty 3
  Filled 2022-01-06: qty 2

## 2022-01-06 MED ORDER — OXYCODONE-ACETAMINOPHEN 5-325 MG PO TABS: 1.0000 | ORAL_TABLET | Freq: Four times a day (QID) | ORAL | 0 refills | Status: DC | PRN

## 2022-01-06 MED ORDER — EZETIMIBE 10 MG PO TABS
10.0000 mg | ORAL_TABLET | Freq: Every day | ORAL | Status: DC
  Administered 2022-01-07 – 2022-01-10 (×4): 10 mg via ORAL
  Filled 2022-01-06 (×4): qty 1

## 2022-01-06 MED ORDER — LACTATED RINGERS IV SOLN: INTRAVENOUS | Status: DC

## 2022-01-06 MED ORDER — TRAMADOL HCL 50 MG PO TABS
50.0000 mg | ORAL_TABLET | Freq: Three times a day (TID) | ORAL | Status: DC | PRN
  Administered 2022-01-07 – 2022-01-10 (×6): 50 mg via ORAL
  Filled 2022-01-06 (×6): qty 1

## 2022-01-06 MED ORDER — VENLAFAXINE HCL ER 37.5 MG PO CP24
37.5000 mg | ORAL_CAPSULE | Freq: Every day | ORAL | Status: DC
  Administered 2022-01-07 – 2022-01-10 (×4): 37.5 mg via ORAL
  Filled 2022-01-06 (×4): qty 1

## 2022-01-06 MED ORDER — PROPOFOL 1000 MG/100ML IV EMUL
INTRAVENOUS | Status: AC
  Filled 2022-01-06: qty 100

## 2022-01-06 MED ORDER — PHENOL 1.4 % MT LIQD: 1.0000 | OROMUCOSAL | Status: DC | PRN

## 2022-01-06 MED ORDER — STERILE WATER FOR IRRIGATION IR SOLN
Status: DC | PRN
  Administered 2022-01-06: 2000 mL

## 2022-01-06 MED ORDER — VANCOMYCIN HCL IN DEXTROSE 1-5 GM/200ML-% IV SOLN
1000.0000 mg | INTRAVENOUS | Status: AC
  Administered 2022-01-06: 1000 mg via INTRAVENOUS
  Filled 2022-01-06: qty 200

## 2022-01-06 MED ORDER — ALUM & MAG HYDROXIDE-SIMETH 200-200-20 MG/5ML PO SUSP: 30.0000 mL | ORAL | Status: DC | PRN

## 2022-01-06 MED ORDER — HYDROMORPHONE HCL 1 MG/ML IJ SOLN
INTRAMUSCULAR | Status: AC
  Administered 2022-01-06: 0.5 mg via INTRAVENOUS
  Filled 2022-01-06: qty 1

## 2022-01-06 MED ORDER — METOCLOPRAMIDE HCL 5 MG PO TABS: 5.0000 mg | ORAL_TABLET | Freq: Three times a day (TID) | ORAL | Status: DC | PRN

## 2022-01-06 MED ORDER — BUPIVACAINE LIPOSOME 1.3 % IJ SUSP
INTRAMUSCULAR | Status: AC
  Filled 2022-01-06: qty 20

## 2022-01-06 MED ORDER — BUPIVACAINE IN DEXTROSE 0.75-8.25 % IT SOLN
INTRATHECAL | Status: DC | PRN
  Administered 2022-01-06: 1.8 mL via INTRATHECAL

## 2022-01-06 MED ORDER — BUPIVACAINE LIPOSOME 1.3 % IJ SUSP: 20.0000 mL | Freq: Once | INTRAMUSCULAR | Status: DC

## 2022-01-06 MED ORDER — WARFARIN SODIUM 6 MG PO TABS
7.0000 mg | ORAL_TABLET | Freq: Once | ORAL | Status: AC
  Administered 2022-01-06: 7 mg via ORAL
  Filled 2022-01-06: qty 1

## 2022-01-06 MED ORDER — LACTATED RINGERS IV BOLUS: 250.0000 mL | Freq: Once | INTRAVENOUS | Status: DC

## 2022-01-06 MED ORDER — SODIUM CHLORIDE (PF) 0.9 % IJ SOLN
INTRAMUSCULAR | Status: AC
  Filled 2022-01-06: qty 30

## 2022-01-06 MED ORDER — FENTANYL CITRATE PF 50 MCG/ML IJ SOSY
100.0000 ug | PREFILLED_SYRINGE | INTRAMUSCULAR | Status: DC
  Administered 2022-01-06: 100 ug via INTRAVENOUS
  Filled 2022-01-06: qty 2

## 2022-01-06 MED ORDER — LORATADINE 10 MG PO TABS
10.0000 mg | ORAL_TABLET | Freq: Every day | ORAL | Status: DC
  Filled 2022-01-06 (×4): qty 1

## 2022-01-06 MED ORDER — ACETAMINOPHEN 500 MG PO TABS
1000.0000 mg | ORAL_TABLET | Freq: Four times a day (QID) | ORAL | Status: AC
  Administered 2022-01-06 – 2022-01-07 (×4): 1000 mg via ORAL
  Filled 2022-01-06 (×4): qty 2

## 2022-01-06 MED ORDER — OXYCODONE HCL 5 MG PO TABS: 5.0000 mg | ORAL_TABLET | Freq: Once | ORAL | Status: AC | PRN

## 2022-01-06 MED ORDER — ONDANSETRON HCL 4 MG/2ML IJ SOLN: 4.0000 mg | Freq: Four times a day (QID) | INTRAMUSCULAR | Status: DC | PRN

## 2022-01-06 MED ORDER — LACTATED RINGERS IV BOLUS
500.0000 mL | Freq: Once | INTRAVENOUS | Status: AC
  Administered 2022-01-06: 500 mL via INTRAVENOUS

## 2022-01-06 MED ORDER — PHENYLEPHRINE HCL-NACL 20-0.9 MG/250ML-% IV SOLN
INTRAVENOUS | Status: AC
  Filled 2022-01-06: qty 250

## 2022-01-06 MED ORDER — MECLIZINE HCL 25 MG PO TABS: 25.0000 mg | ORAL_TABLET | Freq: Three times a day (TID) | ORAL | Status: DC | PRN

## 2022-01-06 MED ORDER — BISACODYL 5 MG PO TBEC
5.0000 mg | DELAYED_RELEASE_TABLET | Freq: Every day | ORAL | Status: DC | PRN
  Administered 2022-01-08: 5 mg via ORAL
  Filled 2022-01-06: qty 1

## 2022-01-06 MED ORDER — CHLORHEXIDINE GLUCONATE 0.12 % MT SOLN
15.0000 mL | Freq: Once | OROMUCOSAL | Status: AC
  Administered 2022-01-06: 15 mL via OROMUCOSAL

## 2022-01-06 MED ORDER — 0.9 % SODIUM CHLORIDE (POUR BTL) OPTIME
TOPICAL | Status: DC | PRN
  Administered 2022-01-06: 1000 mL

## 2022-01-06 MED ORDER — PHENYLEPHRINE HCL-NACL 20-0.9 MG/250ML-% IV SOLN
INTRAVENOUS | Status: DC | PRN
  Administered 2022-01-06: 50 ug/min via INTRAVENOUS

## 2022-01-06 MED ORDER — OXYCODONE HCL 5 MG PO TABS
5.0000 mg | ORAL_TABLET | ORAL | Status: DC | PRN
  Administered 2022-01-06: 5 mg via ORAL
  Filled 2022-01-06: qty 2
  Filled 2022-01-06: qty 1

## 2022-01-06 MED ORDER — LOSARTAN POTASSIUM 25 MG PO TABS
25.0000 mg | ORAL_TABLET | Freq: Every day | ORAL | Status: DC
  Administered 2022-01-07 – 2022-01-10 (×4): 25 mg via ORAL
  Filled 2022-01-06 (×4): qty 1

## 2022-01-06 MED ORDER — PROPOFOL 500 MG/50ML IV EMUL
INTRAVENOUS | Status: AC
  Filled 2022-01-06: qty 50

## 2022-01-06 MED ORDER — MENTHOL 3 MG MT LOZG: 1.0000 | LOZENGE | OROMUCOSAL | Status: DC | PRN

## 2022-01-06 MED ORDER — ISOSORBIDE MONONITRATE ER 30 MG PO TB24
30.0000 mg | ORAL_TABLET | Freq: Every evening | ORAL | Status: DC
  Administered 2022-01-06 – 2022-01-09 (×4): 30 mg via ORAL
  Filled 2022-01-06 (×4): qty 1

## 2022-01-06 MED ORDER — POVIDONE-IODINE 10 % EX SWAB
2.0000 | Freq: Once | CUTANEOUS | Status: AC
  Administered 2022-01-06: 2 via TOPICAL

## 2022-01-06 MED ORDER — VERAPAMIL HCL 40 MG PO TABS
160.0000 mg | ORAL_TABLET | Freq: Two times a day (BID) | ORAL | Status: DC
  Administered 2022-01-06 – 2022-01-10 (×8): 160 mg via ORAL
  Filled 2022-01-06 (×3): qty 4
  Filled 2022-01-06: qty 2
  Filled 2022-01-06 (×2): qty 4
  Filled 2022-01-06: qty 2
  Filled 2022-01-06 (×2): qty 4

## 2022-01-06 MED ORDER — PROMETHAZINE HCL 25 MG/ML IJ SOLN: 6.2500 mg | INTRAMUSCULAR | Status: DC | PRN

## 2022-01-06 MED ORDER — HYDROMORPHONE HCL 1 MG/ML IJ SOLN
0.2500 mg | INTRAMUSCULAR | Status: DC | PRN
  Administered 2022-01-06 (×2): 0.5 mg via INTRAVENOUS

## 2022-01-06 MED ORDER — SODIUM CHLORIDE 0.9% IV SOLUTION
INTRAVENOUS | Status: AC | PRN
  Administered 2022-01-06: 3000 mL

## 2022-01-06 MED ORDER — LACTATED RINGERS IV BOLUS
250.0000 mL | Freq: Once | INTRAVENOUS | Status: AC
  Administered 2022-01-06: 250 mL via INTRAVENOUS

## 2022-01-06 MED ORDER — OXYCODONE HCL 5 MG PO TABS
ORAL_TABLET | ORAL | Status: AC
  Filled 2022-01-06: qty 2

## 2022-01-06 MED ORDER — SODIUM CHLORIDE (PF) 0.9 % IJ SOLN
INTRAMUSCULAR | Status: DC | PRN
  Administered 2022-01-06: 30 mL

## 2022-01-06 MED ORDER — TRANEXAMIC ACID 1000 MG/10ML IV SOLN
INTRAVENOUS | Status: DC | PRN
  Administered 2022-01-06: 2000 mg via TOPICAL

## 2022-01-06 MED ORDER — DEXAMETHASONE SODIUM PHOSPHATE 10 MG/ML IJ SOLN
INTRAMUSCULAR | Status: AC
  Filled 2022-01-06: qty 1

## 2022-01-06 MED ORDER — ONDANSETRON HCL 4 MG PO TABS
4.0000 mg | ORAL_TABLET | Freq: Four times a day (QID) | ORAL | Status: DC | PRN
  Administered 2022-01-09: 4 mg via ORAL
  Filled 2022-01-06: qty 1

## 2022-01-06 MED ORDER — TRANEXAMIC ACID-NACL 1000-0.7 MG/100ML-% IV SOLN: 1000.0000 mg | Freq: Once | INTRAVENOUS | Status: DC

## 2022-01-06 MED ORDER — ENOXAPARIN SODIUM 100 MG/ML IJ SOSY
100.0000 mg | PREFILLED_SYRINGE | Freq: Two times a day (BID) | INTRAMUSCULAR | Status: DC
  Administered 2022-01-07 – 2022-01-10 (×7): 100 mg via SUBCUTANEOUS
  Filled 2022-01-06 (×7): qty 1

## 2022-01-06 MED ORDER — HYDROMORPHONE HCL 1 MG/ML IJ SOLN
INTRAMUSCULAR | Status: AC
  Filled 2022-01-06: qty 1

## 2022-01-06 MED ORDER — ONDANSETRON HCL 4 MG/2ML IJ SOLN
INTRAMUSCULAR | Status: AC
  Filled 2022-01-06: qty 2

## 2022-01-06 MED ORDER — ACETAMINOPHEN 325 MG PO TABS: 325.0000 mg | ORAL_TABLET | Freq: Four times a day (QID) | ORAL | Status: DC | PRN

## 2022-01-06 MED ORDER — DIPHENHYDRAMINE HCL 12.5 MG/5ML PO ELIX: 12.5000 mg | ORAL_SOLUTION | ORAL | Status: DC | PRN

## 2022-01-06 MED ORDER — DOCUSATE SODIUM 100 MG PO CAPS
100.0000 mg | ORAL_CAPSULE | Freq: Two times a day (BID) | ORAL | Status: DC
  Administered 2022-01-06 – 2022-01-10 (×8): 100 mg via ORAL
  Filled 2022-01-06 (×8): qty 1

## 2022-01-06 MED ORDER — FLUTICASONE PROPIONATE 50 MCG/ACT NA SUSP
1.0000 | Freq: Two times a day (BID) | NASAL | Status: DC
  Administered 2022-01-06 – 2022-01-10 (×8): 1 via NASAL
  Filled 2022-01-06: qty 16

## 2022-01-06 MED ORDER — VANCOMYCIN HCL IN DEXTROSE 1-5 GM/200ML-% IV SOLN
1000.0000 mg | Freq: Two times a day (BID) | INTRAVENOUS | Status: AC
  Administered 2022-01-06: 1000 mg via INTRAVENOUS
  Filled 2022-01-06: qty 200

## 2022-01-06 MED ORDER — HYDROMORPHONE HCL 1 MG/ML IJ SOLN
0.5000 mg | INTRAMUSCULAR | Status: DC | PRN
  Administered 2022-01-06 – 2022-01-08 (×8): 1 mg via INTRAVENOUS
  Filled 2022-01-06 (×9): qty 1

## 2022-01-06 MED ORDER — BUPIVACAINE LIPOSOME 1.3 % IJ SUSP
INTRAMUSCULAR | Status: DC | PRN
  Administered 2022-01-06: 20 mL

## 2022-01-06 MED ORDER — WARFARIN - PHARMACIST DOSING INPATIENT: Freq: Every day | Status: DC

## 2022-01-06 MED ORDER — PROPOFOL 500 MG/50ML IV EMUL
INTRAVENOUS | Status: DC | PRN
  Administered 2022-01-06: 130 ug/kg/min via INTRAVENOUS

## 2022-01-06 MED ORDER — KETOROLAC TROMETHAMINE 15 MG/ML IJ SOLN
7.5000 mg | Freq: Four times a day (QID) | INTRAMUSCULAR | Status: AC
  Filled 2022-01-06 (×2): qty 1

## 2022-01-06 MED ORDER — ORAL CARE MOUTH RINSE: 15.0000 mL | Freq: Once | OROMUCOSAL | Status: AC

## 2022-01-06 MED ORDER — BUPIVACAINE-EPINEPHRINE (PF) 0.25% -1:200000 IJ SOLN
INTRAMUSCULAR | Status: DC | PRN
  Administered 2022-01-06: 30 mL

## 2022-01-06 SURGICAL SUPPLY — 51 items
ATTUNE PS FEM RT SZ 4 CEM KNEE (Femur) IMPLANT
ATTUNE PSRP INSR SZ4 8 KNEE (Insert) IMPLANT
BAG COUNTER SPONGE SURGICOUNT (BAG) ×1 IMPLANT
BAG DECANTER FOR FLEXI CONT (MISCELLANEOUS) ×1 IMPLANT
BAG ZIPLOCK 12X15 (MISCELLANEOUS) ×1 IMPLANT
BASE TIBIAL ROT PLAT SZ 5 KNEE (Knees) IMPLANT
BLADE SAGITTAL 25.0X1.19X90 (BLADE) ×1 IMPLANT
BLADE SAW SGTL 11.0X1.19X90.0M (BLADE) ×1 IMPLANT
BLADE SURG SZ10 CARB STEEL (BLADE) ×1 IMPLANT
BNDG ELASTIC 6X5.8 VLCR STR LF (GAUZE/BANDAGES/DRESSINGS) ×1 IMPLANT
BOOTIES KNEE HIGH SLOAN (MISCELLANEOUS) ×1 IMPLANT
BOWL SMART MIX CTS (DISPOSABLE) ×1 IMPLANT
CEMENT HV SMART SET (Cement) ×2 IMPLANT
COVER SURGICAL LIGHT HANDLE (MISCELLANEOUS) ×1 IMPLANT
CUFF TOURN SGL QUICK 34 (TOURNIQUET CUFF) ×1
CUFF TRNQT CYL 34X4.125X (TOURNIQUET CUFF) ×1 IMPLANT
DRAPE INCISE IOBAN 66X45 STRL (DRAPES) ×1 IMPLANT
DRAPE SHEET LG 3/4 BI-LAMINATE (DRAPES) ×1 IMPLANT
DRAPE TOP 10253 STERILE (DRAPES) ×1 IMPLANT
DRAPE U-SHAPE 47X51 STRL (DRAPES) ×1 IMPLANT
DRSG AQUACEL AG ADV 3.5X10 (GAUZE/BANDAGES/DRESSINGS) ×1 IMPLANT
DURAPREP 26ML APPLICATOR (WOUND CARE) ×2 IMPLANT
ELECT REM PT RETURN 15FT ADLT (MISCELLANEOUS) ×1 IMPLANT
GLOVE BIO SURGEON STRL SZ8 (GLOVE) ×2 IMPLANT
GLOVE BIOGEL PI IND STRL 8 (GLOVE) ×2 IMPLANT
GOWN STRL REUS W/ TWL XL LVL3 (GOWN DISPOSABLE) ×2 IMPLANT
GOWN STRL REUS W/TWL XL LVL3 (GOWN DISPOSABLE) ×2
HANDPIECE INTERPULSE COAX TIP (DISPOSABLE) ×1
HOLDER FOLEY CATH W/STRAP (MISCELLANEOUS) IMPLANT
HOOD PEEL AWAY FLYTE STAYCOOL (MISCELLANEOUS) ×3 IMPLANT
KIT TURNOVER KIT A (KITS) IMPLANT
MANIFOLD NEPTUNE II (INSTRUMENTS) ×1 IMPLANT
NEEDLE HYPO 22GX1.5 SAFETY (NEEDLE) ×1 IMPLANT
NS IRRIG 1000ML POUR BTL (IV SOLUTION) ×1 IMPLANT
PACK TOTAL KNEE CUSTOM (KITS) ×1 IMPLANT
PAD ARMBOARD 7.5X6 YLW CONV (MISCELLANEOUS) ×1 IMPLANT
PATELLA MEDIAL ATTUN 35MM KNEE (Knees) IMPLANT
PROTECTOR NERVE ULNAR (MISCELLANEOUS) ×1 IMPLANT
SET HNDPC FAN SPRY TIP SCT (DISPOSABLE) ×1 IMPLANT
SPIKE FLUID TRANSFER (MISCELLANEOUS) ×2 IMPLANT
SUT ETHIBOND NAB CT1 #1 30IN (SUTURE) ×2 IMPLANT
SUT VIC AB 0 CT1 36 (SUTURE) ×1 IMPLANT
SUT VIC AB 2-0 CT1 27 (SUTURE) ×1
SUT VIC AB 2-0 CT1 TAPERPNT 27 (SUTURE) ×1 IMPLANT
SUT VICRYL AB 3-0 FS1 BRD 27IN (SUTURE) ×1 IMPLANT
SUT VLOC 180 0 24IN GS25 (SUTURE) ×1 IMPLANT
TIBIAL BASE ROT PLAT SZ 5 KNEE (Knees) ×1 IMPLANT
TRAY FOLEY MTR SLVR 16FR STAT (SET/KITS/TRAYS/PACK) IMPLANT
WATER STERILE IRR 1000ML POUR (IV SOLUTION) ×1 IMPLANT
WRAP KNEE MAXI GEL POST OP (GAUZE/BANDAGES/DRESSINGS) ×1 IMPLANT
YANKAUER SUCT BULB TIP NO VENT (SUCTIONS) ×1 IMPLANT

## 2022-01-06 NOTE — Evaluation (Signed)
Physical Therapy Evaluation Patient Details Name: [name] MRN: [id number] DOB: 1955-12-19 Today's Date: 01/06/2022  History of Present Illness  66 y/o admit for s/p RTKA 01/06/2022 PMH:HTN, GERD. arthritis, pacemaker  Clinical Impression  [name] is a 66 y.o. 01[birth year]5374 POD 0 s/p RTKA. Patient reports independent with mobility at baseline. Patient is now limited by functional impairments (see PT problem list below) and requires MinA for transfers and gait with RW. Patient was able to ambulate 3 feet with RW and MinA assist greatly limited by pain with weight bearing.  Patient instructed in exercise to facilitate ROM and circulation to manage edema. Patient will benefit from continued skilled PT interventions to address impairments and progress towards PLOF. Acute PT will follow to progress mobility and stair training in preparation for safe discharge home with family assist.         Recommendations for follow up therapy are one component of a multi-disciplinary discharge planning process, led by the attending physician.  Recommendations may be updated based on patient status, additional functional criteria and insurance authorization.  Follow Up Recommendations Follow physician's recommendations for discharge plan and follow up therapies (pt states OPPT)      Assistance Recommended at Discharge Frequent or constant Supervision/Assistance  Patient can return home with the following  A little help with walking and/or transfers;A little help with bathing/dressing/bathroom;Assistance with feeding;Assistance with cooking/housework;Assist for transportation;Help with stairs or ramp for entrance    Equipment Recommendations None recommended by PT  Recommendations for Other Services       Functional Status Assessment Patient has had a recent decline in their functional status and demonstrates the ability to make significant improvements in function in a reasonable and predictable  amount of time.     Precautions / Restrictions Precautions Precautions: Knee Precaution Comments: educated with knee precautions avoiding pillow under knee at rest to enhance knee extension.      Mobility  Bed Mobility Overal bed mobility: Needs Assistance Bed Mobility: Supine to Sit, Sit to Supine     Supine to sit: Min assist     General bed mobility comments: assist with R  LE the entire bed mobility    Transfers Overall transfer level: Needs assistance Equipment used: Standard walker Transfers: Sit to/from Stand Sit to Stand: Min assist                Ambulation/Gait Ambulation/Gait assistance: Min assist Gait Distance (Feet): 3 Feet Assistive device: Rolling walker (2 wheels) Gait Pattern/deviations: Step-to pattern       General Gait Details: gait limited due to as soon as she weigh beared she had great amounts of pain and had to transiton to the recliner  Stairs            Wheelchair Mobility    Modified Rankin (Stroke Patients Only)       Balance Overall balance assessment: Needs assistance Sitting-balance support: Bilateral upper extremity supported, Feet supported Sitting balance-Leahy Scale: Good     Standing balance support: Bilateral upper extremity supported, During functional activity Standing balance-Leahy Scale: Fair                               Pertinent Vitals/Pain Pain Assessment Pain Assessment: 0-10 Pain Score: 7  Pain Location: pian was tolerable until she stood with weight bearing it was 7/10 Pain Descriptors / Indicators: Aching, Sore, Shooting Pain Intervention(s): Limited activity within patient's tolerance, Monitored during session, Ice  applied, RN gave pain meds during session    [place] expects to be discharged to:: Private residence Living Arrangements: Spouse/significant other Available Help at Discharge: Family Type of Home: House Home Access: Stairs to enter Entrance  Stairs-Rails: None Entrance Stairs-Number of Steps: 1   Home Layout: One level Home Equipment: Animator (2 wheels);Cane - single point      Prior Function Prior Level of Function : Independent/Modified Independent             Mobility Comments: occassional cane or RW use       Hand Dominance        Extremity/Trunk Assessment        Lower Extremity Assessment Lower Extremity Assessment: RLE deficits/detail RLE Deficits / Details: 10-50       Communication   Communication: No difficulties  Cognition Arousal/Alertness: Awake/alert Behavior During Therapy: WFL for tasks assessed/performed Overall Cognitive Status: Within Functional Limits for tasks assessed                                          General Comments      Exercises Total Joint Exercises Ankle Circles/Pumps: AROM, Both, 10 reps, Supine Quad Sets: AROM, Supine, Right, 10 reps Heel Slides: AAROM, Supine, Right, 5 reps Straight Leg Raises: AAROM, Supine, Right, 5 reps Goniometric ROM: 10-50   Assessment/Plan    PT Assessment Patient needs continued PT services  PT Problem List Decreased strength;Decreased range of motion;Decreased activity tolerance;Decreased balance;Decreased mobility       PT Treatment Interventions Gait training;DME instruction;Stair training;Functional mobility training;Therapeutic activities;Therapeutic exercise;Patient/family education    PT Goals (Current goals can be found in the Care Plan section)  Acute Rehab PT Goals Patient Stated Goal: I want to be able to be with my grandkids PT Goal Formulation: With patient Time For Goal Achievement: 01/20/22 Potential to Achieve Goals: Good    Frequency 7X/week     Co-evaluation               AM-PAC PT "6 Clicks" Mobility  Outcome Measure Help needed turning from your back to your side while in a flat bed without using bedrails?: A Little Help needed moving from lying on your  back to sitting on the side of a flat bed without using bedrails?: A Little Help needed moving to and from a bed to a chair (including a wheelchair)?: A Lot Help needed standing up from a chair using your arms (e.g., wheelchair or bedside chair)?: A Lot Help needed to walk in hospital room?: A Lot Help needed climbing 3-5 steps with a railing? : A Lot 6 Click Score: 14    End of Session Equipment Utilized During Treatment: Gait belt Activity Tolerance: Patient tolerated treatment well Patient left: in chair;with call bell/phone within reach;with nursing/sitter in room Nurse Communication: Mobility status PT Visit Diagnosis: Other abnormalities of gait and mobility (R26.89)    Time: 7096-2836 PT Time Calculation (min) (ACUTE ONLY): 35 min   Charges:   PT Evaluation $PT Eval Low Complexity: 1 Low PT Treatments $Therapeutic Activity: 8-22 mins        Gatha Mayer, PT, MPT Acute Rehabilitation Services Office: [phone] If a weekend: [facility] w/e pager [phone] 01/06/2022   Clide Dales 01/06/2022, 8:45 PM

## 2022-01-06 NOTE — Transfer of Care (Signed)
Immediate Anesthesia Transfer of Care Note  Patient: [name]  Procedure(s) Performed: RIGHT TOTAL KNEE ARTHROPLASTY (Right: Knee)  Patient Location: PACU  Anesthesia Type:Spinal  Level of Consciousness: sedated  Airway & Oxygen Therapy: Patient Spontanous Breathing and Patient connected to face mask oxygen  Post-op Assessment: Report given to RN and Post -op Vital signs reviewed and stable  Post vital signs: Reviewed and stable  Last Vitals:  Vitals Value Taken Time  BP 133/63 01/06/22 1124  Temp    Pulse 78 01/06/22 1126  Resp 36 01/06/22 1126  SpO2 97 % 01/06/22 1126  Vitals shown include unvalidated device data.  Last Pain:  Vitals:   01/06/22 0616  TempSrc: Oral         Complications: No notable events documented.

## 2022-01-06 NOTE — Progress Notes (Signed)
ANTICOAGULATION CONSULT NOTE - Initial Consult  Pharmacy Consult for Warfarin Indication: atrial fibrillation, hx of DVT  Allergies  Allergen Reactions   Aspirin Other (See Comments)    Makes asthma worse and makes stomach hurt   Banana Swelling and Cough    Inside of the mouth swells   Food Other (See Comments) and Cough    Nuts- mucus membranes inflamed/cough    Iodinated Contrast Media Anaphylaxis    Required epinephrine. Since then, has tolerated with premed. Other reaction(s): wheeze...requires premdication with prednisone 3 days in advance   Latex Hives and Cough    Wheezing/cough  Other reaction(s): hives/wheeze   Meperidine Hcl Other (See Comments)    Projectile vomiting    Peanut-Containing Drug Products Swelling and Cough    Inside of the mouth swells   Penicillins Anaphylaxis    Has patient had a PCN reaction causing immediate rash, facial/tongue/throat swelling, SOB or lightheadedness with hypotension:Yes Has patient had a PCN reaction causing severe rash involving mucus membranes or skin necrosis:Yes Has patient had a PCN reaction that required hospitalization:Treated in ER w/epi & breathing treatments Has patient had a PCN reaction occurring within the last 10 years:No If all of the above answers are "NO", then may proceed with Cephalosporin use.    Statins Other (See Comments)    Severe leg pain - has tried most of them   Sulfonamide Derivatives Anaphylaxis   Tape Hives and Dermatitis    No plastic tape!!   Alfuzosin Other (See Comments)    Headache, dizziness   Halothane     Listed under anesthesia adverse reactions pt does not know what reaction or if any at preop on 05/26/21.    Other Other (See Comments)    Vascepa- upset stomach   Sulfamethoxazole Hives    Patient Measurements: Height: '5\' 4"'$  (162.6 cm) Weight: 101.6 kg (224 lb) IBW/kg (Calculated) : 54.7  Vital Signs: Temp: 98 F (36.7 C) (10/17 1200) Temp Source: Oral (10/17 0616) BP: 161/85  (10/17 1530) Pulse Rate: 69 (10/17 1530)  Labs: Recent Labs    01/06/22 0609  LABPROT 13.4  INR 1.0    Estimated Creatinine Clearance: 79.2 mL/min (by C-G formula based on SCr of 0.67 mg/dL).   Medical History: Past Medical History:  Diagnosis Date   Allergic rhinitis    Anginal pain ([place])    Arthritis    "back" (05/07/2016)   Asthma    Atrial flutter ([place])    a. Remotely per notes.   Carpal tunnel syndrome, bilateral    Colon polyps    Diverticulitis    Dizziness    DVT (deep venous thrombosis) ([place]) 1980s x2 -    between birth of two sons. Saw hematology - was told she has a hypercoagulable disorder, should be on Coumadin lifelong.   Dysrhythmia    atrial fib   Family history of adverse reaction to anesthesia    "mother gets PONV"   Gallbladder disease    GERD (gastroesophageal reflux disease)    GIB (gastrointestinal bleeding) 04/14/2018   Halothane adverse reaction    narrow small opening   Heart murmur    Hiatal hernia    History of hiatal hernia    "small one/CTA 05/01/2016" (05/07/2016)   History of kidney stones    HTN (hypertension)    Hypercholesteremia    hx; "brought it down w/diet" (05/07/2016)   Hyperglycemia    a. A1c 6.1 in 2014.   Inguinal hernia    Iron deficiency anemia  due to chronic blood loss 04/14/2018   Iron malabsorption 04/14/2018   Left sided sciatica    Normal cardiac stress test 06/2012   Obesity    OSA (obstructive sleep apnea)    a. Mild, did not tolerate CPAP (05/07/2016)   PAF (paroxysmal atrial fibrillation) ([place])    a. s/p afib ablation at [facility] 2010 (Dr. Annabell Howells). b. Recurrent AF s/p DCCV 06/2010. c. On flecainide.    Paroxysmal atrial fibrillation ([place]) 09/09/2009   Qualifier: Diagnosis of  By: Selena Batten CMA, [place]     PONV (postoperative nausea and vomiting)    PPD positive, treated 1977   "treated for 1 yr after exposure to patient"   Pre-diabetes    RSV infection ~ 2006   Vertigo    Wandering (atrial) pacemaker     a. Remotely per notes.  (  pt. states has a wandering p wave ) no pacemaker    Assessment: 37 y/oF with PMH of atrial fibrillation, hx of DVT now s/p right total knee arthroplasty. Patient on warfarin PTA ('6mg'$  every Tues/Sat, '4mg'$  all other days of the week)-last dose 12/31/21. Patient bridged with Enoxaparin '100mg'$  SQ q12h - last dose 01/05/22. Pharmacy consulted to resume Warfarin post-operatively. INR 1. CBC (last checked 12/24/21) WNL.   Goal of Therapy:  INR 2-3 Monitor platelets by anticoagulation protocol: Yes   Plan:  Warfarin '7mg'$  PO x 1 tonight as per recommendations from [facility] note 12/24/21. Enoxaparin '100mg'$  SQ q12h to resume 01/07/22 AM as per Anti-coag Clinic recommendations (discussed with Loni Dolly, PA-C). Continue until INR therapeutic. Daily PT/INR CBC in AM and at least q72h while hospitalized Monitor closely for s/sx of bleeding    Lindell Spar, PharmD, BCPS Clinical Pharmacist  01/06/2022,3:58 PM

## 2022-01-06 NOTE — Anesthesia Postprocedure Evaluation (Signed)
Anesthesia Post Note  Patient: [name]  Procedure(s) Performed: RIGHT TOTAL KNEE ARTHROPLASTY (Right: Knee)     Patient location during evaluation: PACU Anesthesia Type: Regional and Spinal Level of consciousness: awake and alert Pain management: pain level controlled Vital Signs Assessment: post-procedure vital signs reviewed and stable Respiratory status: spontaneous breathing, nonlabored ventilation and respiratory function stable Cardiovascular status: blood pressure returned to baseline and stable Postop Assessment: no apparent nausea or vomiting Anesthetic complications: no   No notable events documented.  Last Vitals:  Vitals:   01/06/22 1145 01/06/22 1200  BP: (!) 150/75 (!) 149/75  Pulse: 71 70  Resp: 15 15  Temp:    SpO2: 91% 95%    Last Pain:  Vitals:   01/06/22 1200  TempSrc:   PainSc: 0-No pain                 Lynda Rainwater

## 2022-01-06 NOTE — Interval H&P Note (Signed)
History and Physical Interval Note:  01/06/2022 8:56 AM  [name]  has presented today for surgery, with the diagnosis of RIGHT KNEE DEGENERATIVE JOINT.  The various methods of treatment have been discussed with the patient and family. After consideration of risks, benefits and other options for treatment, the patient has consented to  Procedure(s): RIGHT TOTAL KNEE ARTHROPLASTY (Right) as a surgical intervention.  The patient's history has been reviewed, patient examined, no change in status, stable for surgery.  I have reviewed the patient's chart and labs.  Questions were answered to the patient's satisfaction.     Hessie Dibble

## 2022-01-06 NOTE — Anesthesia Preprocedure Evaluation (Signed)
Anesthesia Evaluation  Patient identified by MRN, date of birth, ID band Patient awake    Reviewed: Allergy & Precautions, NPO status , Patient's Chart, lab work & pertinent test results  History of Anesthesia Complications (+) PONV and history of anesthetic complications  Airway Mallampati: III  TM Distance: <3 FB Neck ROM: Full    Dental no notable dental hx.    Pulmonary asthma , sleep apnea ,    Pulmonary exam normal breath sounds clear to auscultation       Cardiovascular hypertension, Pt. on medications + angina Normal cardiovascular exam+ dysrhythmias Atrial Fibrillation  Rhythm:Regular Rate:Normal     Neuro/Psych negative neurological ROS  negative psych ROS   GI/Hepatic Neg liver ROS, GERD  Medicated,  Endo/Other  diabetes, Type 2  Renal/GU negative Renal ROS  negative genitourinary   Musculoskeletal  (+) Arthritis , Osteoarthritis,    Abdominal (+) + obese,   Peds negative pediatric ROS (+)  Hematology negative hematology ROS (+)   Anesthesia Other Findings   Reproductive/Obstetrics negative OB ROS                             Anesthesia Physical  Anesthesia Plan  ASA: 3  Anesthesia Plan: Regional and Spinal   Post-op Pain Management: Regional block*   Induction: Intravenous  PONV Risk Score and Plan: 3 and Ondansetron, Dexamethasone, Treatment may vary due to age or medical condition and Midazolam  Airway Management Planned: Simple Face Mask  Additional Equipment:   Intra-op Plan:   Post-operative Plan:   Informed Consent: I have reviewed the patients History and Physical, chart, labs and discussed the procedure including the risks, benefits and alternatives for the proposed anesthesia with the patient or authorized representative who has indicated his/her understanding and acceptance.     Dental advisory given  Plan Discussed with: CRNA and  Surgeon  Anesthesia Plan Comments: (See PAT note 05/26/2021, Konrad Felix Ward, PA-C)        Anesthesia Quick Evaluation

## 2022-01-06 NOTE — Anesthesia Procedure Notes (Signed)
Anesthesia Regional Block: Adductor canal block   Pre-Anesthetic Checklist: , timeout performed,  Correct Patient, Correct Site, Correct Laterality,  Correct Procedure, Correct Position, site marked,  Risks and benefits discussed,  Surgical consent,  Pre-op evaluation,  At surgeon's request and post-op pain management  Laterality: Right  Prep: chloraprep       Needles:  Injection technique: Single-shot  Needle Type: Stimiplex     Needle Length: 9cm  Needle Gauge: 21     Additional Needles:   Procedures:,,,, ultrasound used (permanent image in chart),,    Narrative:  Start time: 01/06/2022 8:15 AM End time: 01/06/2022 8:20 AM Injection made incrementally with aspirations every 5 mL.  Performed by: Personally  Anesthesiologist: Lynda Rainwater, MD

## 2022-01-06 NOTE — Op Note (Signed)
PREOP DIAGNOSIS: DJD RIGHT KNEE POSTOP DIAGNOSIS: same PROCEDURE: RIGHT TKR ANESTHESIA: Spinal and MAC ATTENDING SURGEON: Hessie Dibble ASSISTANT: Loni Dolly PA  INDICATIONS FOR PROCEDURE: [name] is a 66 y.o. female who has struggled for a long time with pain due to degenerative arthritis of the right knee.  The patient has failed many conservative non-operative measures and at this point has pain which limits the ability to sleep and walk.  The patient is offered total knee replacement.  Informed operative consent was obtained after discussion of possible risks of anesthesia, infection, neurovascular injury, DVT, and death.  The importance of the post-operative rehabilitation protocol to optimize result was stressed extensively with the patient.  SUMMARY OF FINDINGS AND PROCEDURE:  [name] was taken to the operative suite where under the above anesthesia a right knee replacement was performed.  There were advanced degenerative changes and the bone quality was good.  We used the DePuy Attune system and placed size 4 femur, 5 tibia, 35 mm all polyethylene patella, and a size 8 mm spacer.  Loni Dolly PA-C assisted throughout and was invaluable to the completion of the case in that he helped retract and maintain exposure while I placed components.  He also helped close thereby minimizing OR time.  The patient was admitted for appropriate post-op care to include perioperative antibiotics and mechanical and pharmacologic measures for DVT prophylaxis.  DESCRIPTION OF PROCEDURE:  [name] was taken to the operative suite where the above anesthesia was applied.  The patient was positioned supine and prepped and draped in normal sterile fashion.  An appropriate time out was performed.  After the administration of vancomycin pre-op antibiotic the leg was elevated and exsanguinated and a tourniquet inflated. A standard longitudinal incision was made on the anterior knee.  Dissection  was carried down to the extensor mechanism.  All appropriate anti-infective measures were used including the pre-operative antibiotic, betadine impregnated drape, and closed hooded exhaust systems for each member of the surgical team.  A medial parapatellar incision was made in the extensor mechanism and the knee cap flipped and the knee flexed.  Some residual meniscal tissues were removed along with any remaining ACL/PCL tissue.  A guide was placed on the tibia and a flat cut was made on it's superior surface.  An intramedullary guide was placed in the femur and was utilized to make anterior and posterior cuts creating an appropriate flexion gap.  A second intramedullary guide was placed in the femur to make a distal cut properly balancing the knee with an extension gap equal to the flexion gap.  The three bones sized to the above mentioned sizes and the appropriate guides were placed and utilized.  A trial reduction was done and the knee easily came to full extension and the patella tracked well on flexion.  The trial components were removed and all bones were cleaned with pulsatile lavage and then dried thoroughly.  Cement was mixed and was pressurized onto the bones followed by placement of the aforementioned components.  Excess cement was trimmed and pressure was held on the components until the cement had hardened.  The tourniquet was deflated and a small amount of bleeding was controlled with cautery and pressure.  The knee was irrigated thoroughly.  The extensor mechanism was re-approximated with #1 ethibond in interrupted fashion.  The knee was flexed and the repair was solid.  The subcutaneous tissues were re-approximated with #0 and #2-0 vicryl and the skin closed with  a subcuticular stitch and steristrips.  A sterile dressing was applied.  Intraoperative fluids, EBL, and tourniquet time can be obtained from anesthesia records.  DISPOSITION:  The patient was taken to recovery room in stable condition  and scheduled to potentially go home same day depending on ability to walk and tolerate liquids.[name] 01/06/2022, 10:59 AM

## 2022-01-06 NOTE — Anesthesia Procedure Notes (Signed)
Spinal  Patient location during procedure: OR Start time: 01/06/2022 9:39 AM End time: 01/06/2022 9:41 AM Staffing Performed: resident/CRNA  Anesthesiologist: Lynda Rainwater, MD Resident/CRNA: Lind Covert, CRNA Performed by: Lind Covert, CRNA Authorized by: Lind Covert, CRNA   Preanesthetic Checklist Completed: patient identified, IV checked, site marked, risks and benefits discussed, surgical consent, monitors and equipment checked, pre-op evaluation and timeout performed Spinal Block Patient position: sitting Prep: DuraPrep Patient monitoring: heart rate, cardiac monitor, blood pressure and continuous pulse ox Approach: midline Location: L3-4 Needle Needle type: Pencan  Needle gauge: 24 G Needle length: 10 cm Needle insertion depth: 7 cm Assessment Sensory level: T6 Events: CSF return Additional Notes Timeout performed. Patient in sitting position. L 3-4 identified. Cleansed with Duraprep. SAB without difficulty. To supine position.

## 2022-01-07 ENCOUNTER — Encounter (HOSPITAL_COMMUNITY): Payer: Self-pay | Admitting: Orthopaedic Surgery

## 2022-01-07 LAB — CBC
HCT: 35.6 % — ABNORMAL LOW (ref 36.0–46.0)
Hemoglobin: 11.9 g/dL — ABNORMAL LOW (ref 12.0–15.0)
MCH: 30.7 pg (ref 26.0–34.0)
MCHC: 33.4 g/dL (ref 30.0–36.0)
MCV: 91.8 fL (ref 80.0–100.0)
Platelets: 347 10*3/uL (ref 150–400)
RBC: 3.88 MIL/uL (ref 3.87–5.11)
RDW: 12.7 % (ref 11.5–15.5)
WBC: 17.1 10*3/uL — ABNORMAL HIGH (ref 4.0–10.5)
nRBC: 0 % (ref 0.0–0.2)

## 2022-01-07 LAB — PROTIME-INR
INR: 1.1 (ref 0.8–1.2)
Prothrombin Time: 14.3 seconds (ref 11.4–15.2)

## 2022-01-07 MED ORDER — SODIUM CHLORIDE 0.9 % IV BOLUS
500.0000 mL | Freq: Once | INTRAVENOUS | Status: AC
  Administered 2022-01-07: 500 mL via INTRAVENOUS

## 2022-01-07 MED ORDER — WARFARIN SODIUM 5 MG PO TABS
5.0000 mg | ORAL_TABLET | Freq: Once | ORAL | Status: AC
  Administered 2022-01-07: 5 mg via ORAL
  Filled 2022-01-07: qty 1

## 2022-01-07 NOTE — Plan of Care (Signed)
  Problem: Education: Goal: Knowledge of General Education information will improve Description Including pain rating scale, medication(s)/side effects and non-pharmacologic comfort measures Outcome: Progressing   

## 2022-01-07 NOTE — TOC Transition Note (Signed)
Transition of Care [facility]) - CM/SW Discharge Note   Patient Details  Name: [name] MRN: [id number] Date of Birth: May 16, 1955  [facility] CM/SW Contact:  Lennart Pall, LCSW Phone Number: 01/07/2022, 10:19 AM   Clinical Narrative:    Met with pt and confirming she has all needed DME at home.  OPPT arranged with [facility].  No further TOC needs.   Final next level of care: OP Rehab Barriers to Discharge: No Barriers Identified   Patient Goals and CMS Choice Patient states their goals for this hospitalization and ongoing recovery are:: return home      Discharge Placement                       Discharge Plan and Services                DME Arranged: N/A DME Agency: NA                  Social Determinants of Health (SDOH) Interventions     Readmission Risk Interventions    01/07/2022   10:18 AM  Readmission Risk Prevention Plan  Post Dischage Appt Complete  Medication Screening Complete  Transportation Screening Complete

## 2022-01-07 NOTE — Progress Notes (Signed)
Just attempted to contact Pricilla Holm, PA to inform about patient's latest BP: 182/82. Patient not in any distress at this time. Waiting for further orders. Will continue to monitor patient.

## 2022-01-07 NOTE — Progress Notes (Signed)
Notified Loni Dolly, PA that patient did not pass PT. Discharge date modified to 01/08/22. Will continue to monitor patient.

## 2022-01-07 NOTE — Progress Notes (Signed)
ANTICOAGULATION CONSULT NOTE - Follow Up  Pharmacy Consult for Warfarin Indication: atrial fibrillation, hx of DVT  Allergies  Allergen Reactions   Aspirin Other (See Comments)    Makes asthma worse and makes stomach hurt   Banana Swelling and Cough    Inside of the mouth swells   Food Other (See Comments) and Cough    Nuts- mucus membranes inflamed/cough    Iodinated Contrast Media Anaphylaxis    Required epinephrine. Since then, has tolerated with premed. Other reaction(s): wheeze...requires premdication with prednisone 3 days in advance   Latex Hives and Cough    Wheezing/cough  Other reaction(s): hives/wheeze   Meperidine Hcl Other (See Comments)    Projectile vomiting    Peanut-Containing Drug Products Swelling and Cough    Inside of the mouth swells   Penicillins Anaphylaxis    Has patient had a PCN reaction causing immediate rash, facial/tongue/throat swelling, SOB or lightheadedness with hypotension:Yes Has patient had a PCN reaction causing severe rash involving mucus membranes or skin necrosis:Yes Has patient had a PCN reaction that required hospitalization:Treated in ER w/epi & breathing treatments Has patient had a PCN reaction occurring within the last 10 years:No If all of the above answers are "NO", then may proceed with Cephalosporin use.    Statins Other (See Comments)    Severe leg pain - has tried most of them   Sulfonamide Derivatives Anaphylaxis   Tape Hives and Dermatitis    No plastic tape!!   Alfuzosin Other (See Comments)    Headache, dizziness   Halothane     Listed under anesthesia adverse reactions pt does not know what reaction or if any at preop on 05/26/21.    Other Other (See Comments)    Vascepa- upset stomach   Sulfamethoxazole Hives    Patient Measurements: Height: '5\' 4"'$  (162.6 cm) Weight: 101.6 kg (224 lb) IBW/kg (Calculated) : 54.7  Vital Signs: Temp: 98.4 F (36.9 C) (10/18 1040) Temp Source: Oral (10/18 0537) BP: 182/94  (10/18 1040) Pulse Rate: 74 (10/18 1040)  Labs: Recent Labs    01/06/22 0609 01/07/22 0341  HGB  --  11.9*  HCT  --  35.6*  PLT  --  347  LABPROT 13.4 14.3  INR 1.0 1.1     Estimated Creatinine Clearance: 79.2 mL/min (by C-G formula based on SCr of 0.67 mg/dL).   Medical History: Past Medical History:  Diagnosis Date   Allergic rhinitis    Anginal pain ([place])    Arthritis    "back" (05/07/2016)   Asthma    Atrial flutter ([place])    a. Remotely per notes.   Carpal tunnel syndrome, bilateral    Colon polyps    Diverticulitis    Dizziness    DVT (deep venous thrombosis) ([place]) 1980s x2 -    between birth of two sons. Saw hematology - was told she has a hypercoagulable disorder, should be on Coumadin lifelong.   Dysrhythmia    atrial fib   Family history of adverse reaction to anesthesia    "mother gets PONV"   Gallbladder disease    GERD (gastroesophageal reflux disease)    GIB (gastrointestinal bleeding) 04/14/2018   Halothane adverse reaction    narrow small opening   Heart murmur    Hiatal hernia    History of hiatal hernia    "small one/CTA 05/01/2016" (05/07/2016)   History of kidney stones    HTN (hypertension)    Hypercholesteremia    hx; "brought it down  w/diet" (05/07/2016)   Hyperglycemia    a. A1c 6.1 in 2014.   Inguinal hernia    Iron deficiency anemia due to chronic blood loss 04/14/2018   Iron malabsorption 04/14/2018   Left sided sciatica    Normal cardiac stress test 06/2012   Obesity    OSA (obstructive sleep apnea)    a. Mild, did not tolerate CPAP (05/07/2016)   PAF (paroxysmal atrial fibrillation) ([place])    a. s/p afib ablation at [facility] 2010 (Dr. Annabell Howells). b. Recurrent AF s/p DCCV 06/2010. c. On flecainide.    Paroxysmal atrial fibrillation ([place]) 09/09/2009   Qualifier: Diagnosis of  By: Selena Batten CMA, [place]     PONV (postoperative nausea and vomiting)    PPD positive, treated 1977   "treated for 1 yr after exposure to patient"    Pre-diabetes    RSV infection ~ 2006   Vertigo    Wandering (atrial) pacemaker    a. Remotely per notes.  (  pt. states has a wandering p wave ) no pacemaker    Assessment: 25 y/oF with PMH of atrial fibrillation, hx of DVT now s/p right total knee arthroplasty. Patient on warfarin PTA ('6mg'$  every Tues/Sat, '4mg'$  all other days of the week)-last dose 12/31/21. Patient bridged with Enoxaparin '100mg'$  SQ q12h - last dose 01/05/22. Pharmacy consulted to resume warfarin post-operatively.  Anticoagulation Clinic note on 12/24/21 details specific plan for peri-operative warfarin dosing and enoxaparin bridging. Pharmacy discussed with ortho on 10/17 - confirmed to proceed with enoxaparin bridge starting POD#1.  Today, 01/07/22 INR = 1.1 remains subtherapeutic as expected after warfarin held pre-op CBC: Hgb (11.9) slightly low/decreased post-op, Plt WNL SCr 0.67 on 10/4 pre-op labs, CrCl ~75 mL/min TBW ~102 kg  Goal of Therapy:  INR 2-3 Monitor platelets by anticoagulation protocol: Yes   Plan:  Warfarin 5 mg PO once today per anticoagulation clinic note/plan Continue enoxaparin 100 mg (1 mg/kg) subQ q12h bridge until INR therapeutic. Could consider checking anti-Xa level once at steady state. Daily PT/INR CBC in AM and at least q72h while hospitalized. Check SCr with AM labs tomorrow. Monitor for signs and symptoms of bleeding  Lenis Noon, PharmD 01/07/22 12:30 PM

## 2022-01-07 NOTE — Plan of Care (Signed)
  Problem: Activity: Goal: Ability to avoid complications of mobility impairment will improve Outcome: Progressing   

## 2022-01-07 NOTE — Progress Notes (Signed)
Physical Therapy Treatment Patient Details Name: [name] MRN: [id number] DOB: 03-Nov-1955 Today's Date: 01/07/2022   History of Present Illness 66 y/o admit for s/p RTKA 01/06/2022 PMH:HTN, GERD. arthritis, pacemaker    PT Comments    Pt progressing slowly, limited by pain and nausea. Continue efforts to progress pt as able  Pt was able to amb ~ 15' with RW and min to min/guard assist with incr time.   Recommendations for follow up therapy are one component of a multi-disciplinary discharge planning process, led by the attending physician.  Recommendations may be updated based on patient status, additional functional criteria and insurance authorization.  Follow Up Recommendations  Follow physician's recommendations for discharge plan and follow up therapies     Assistance Recommended at Discharge Frequent or constant Supervision/Assistance  Patient can return home with the following A little help with walking and/or transfers;A little help with bathing/dressing/bathroom;Assistance with feeding;Assistance with cooking/housework;Assist for transportation;Help with stairs or ramp for entrance   Equipment Recommendations  None recommended by PT    Recommendations for Other Services       Precautions / Restrictions Precautions Precautions: Knee Precaution Booklet Issued: No Precaution Comments: educated with knee precautions avoiding pillow under knee at rest to enhance knee extension. Restrictions Weight Bearing Restrictions: No Other Position/Activity Restrictions: WBAT     Mobility  Bed Mobility               General bed mobility comments: in recliner    Transfers Overall transfer level: Needs assistance Equipment used: Rolling walker (2 wheels) Transfers: Sit to/from Stand Sit to Stand: Min guard           General transfer comment: cues for hand placement and RLE position    Ambulation/Gait Ambulation/Gait assistance: Min assist Gait Distance  (Feet): 15 Feet Assistive device: Rolling walker (2 wheels) Gait Pattern/deviations: Step-to pattern       General Gait Details: cues for sequence and RW position. limited by pain and nausea d/t pain   Stairs             Wheelchair Mobility    Modified Rankin (Stroke Patients Only)       Balance   Sitting-balance support: Bilateral upper extremity supported, Feet supported Sitting balance-Leahy Scale: Good     Standing balance support: Bilateral upper extremity supported, During functional activity, Reliant on assistive device for balance Standing balance-Leahy Scale: Poor (reliant on device for safe standing)                              Cognition Arousal/Alertness: Awake/alert Behavior During Therapy: WFL for tasks assessed/performed Overall Cognitive Status: Within Functional Limits for tasks assessed                                          Exercises Total Joint Exercises Ankle Circles/Pumps: AROM, Both, 10 reps    General Comments        Pertinent Vitals/Pain Pain Assessment Pain Assessment: 0-10 Pain Score: 7  Pain Location: R knee Pain Descriptors / Indicators: Aching, Sore, Shooting, Grimacing, Guarding Pain Intervention(s): Limited activity within patient's tolerance, Monitored during session, Premedicated before session, Ice applied    Home Living                          Prior  Function            PT Goals (current goals can now be found in the care plan section) Acute Rehab PT Goals Patient Stated Goal: I want to be able to be with my grandkids PT Goal Formulation: With patient Time For Goal Achievement: 01/20/22 Potential to Achieve Goals: Good Progress towards PT goals: Progressing toward goals    Frequency    7X/week      PT Plan Current plan remains appropriate    Co-evaluation              AM-PAC PT "6 Clicks" Mobility   Outcome Measure  Help needed turning from your  back to your side while in a flat bed without using bedrails?: A Little Help needed moving from lying on your back to sitting on the side of a flat bed without using bedrails?: A Little Help needed moving to and from a bed to a chair (including a wheelchair)?: A Little Help needed standing up from a chair using your arms (e.g., wheelchair or bedside chair)?: A Little Help needed to walk in hospital room?: A Little Help needed climbing 3-5 steps with a railing? : A Lot 6 Click Score: 17    End of Session Equipment Utilized During Treatment: Gait belt Activity Tolerance: Patient tolerated treatment well Patient left: in chair;with call bell/phone within reach;with chair alarm set;with family/visitor present Nurse Communication: Mobility status PT Visit Diagnosis: Other abnormalities of gait and mobility (R26.89)     Time: 3419-3790 PT Time Calculation (min) (ACUTE ONLY): 17 min  Charges:  $Gait Training: 8-22 mins                     Baxter Flattery, PT  Acute Rehab Dept [facility]) [phone]  [facility] Weekend Pager ([place] only)  [phone]  01/07/2022    [facility] 01/07/2022, 1:06 PM

## 2022-01-07 NOTE — Progress Notes (Signed)
Subjective: 1 Day Post-Op Procedure(s) (LRB): RIGHT TOTAL KNEE ARTHROPLASTY (Right) Patient resting comfortably sitting bedside eating breakfast. She is having some pain but is hoping to go home today.  Activity level:  wbat Diet tolerance:  ok Voiding:  ok Patient reports pain as mild and moderate.    Objective: Vital signs in last 24 hours: Temp:  [98 F (36.7 C)-98.3 F (36.8 C)] 98 F (36.7 C) (10/18 0537) Pulse Rate:  [56-85] 77 (10/18 0704) Resp:  [10-28] 18 (10/18 0537) BP: (101-182)/(47-114) 182/82 (10/18 0704) SpO2:  [83 %-99 %] 95 % (10/18 0537)  Labs: Recent Labs    01/07/22 0341  HGB 11.9*   Recent Labs    01/07/22 0341  WBC 17.1*  RBC 3.88  HCT 35.6*  PLT 347   No results for input(s): "NA", "K", "CL", "CO2", "BUN", "CREATININE", "GLUCOSE", "CALCIUM" in the last 72 hours. Recent Labs    01/06/22 0609 01/07/22 0341  INR 1.0 1.1    Physical Exam:  Neurologically intact ABD soft Neurovascular intact Sensation intact distally Intact pulses distally Dorsiflexion/Plantar flexion intact Incision: dressing C/D/I and no drainage No cellulitis present Compartment soft  Assessment/Plan:  1 Day Post-Op Procedure(s) (LRB): RIGHT TOTAL KNEE ARTHROPLASTY (Right) Advance diet Up with therapy Discharge home with home health today after PT if cleared and doing well. Follow up in office 2 weeks post op. Continue lovenox bridge to baseline coumadin. I think elevated BP is due to pain and a little dehydration. I will give her a bolus of fluid and her pain meds to see how she does. Follow up in office 2 weeks post op.    Larwance Sachs Piercen Covino 01/07/2022, 8:06 AM

## 2022-01-07 NOTE — Progress Notes (Signed)
01/07/22 1500  PT Visit Information  Last PT Received On 01/07/22  Assistance Needed Pt is progressing,  incr gait distance/tolerance with pain improving however will likely benefit from another day to incr safety and work towards ambulating  a household distance.    History of Present Illness 66 y/o admit for s/p RTKA 01/06/2022 PMH:HTN, GERD. arthritis, pacemaker  Subjective Data  Patient Stated Goal I want to be able to be with my grandkids  Precautions  Precautions Knee  Precaution Booklet Issued No  Precaution Comments educated with knee precautions avoiding pillow under knee at rest to enhance knee extension.  Restrictions  Weight Bearing Restrictions No  Other Position/Activity Restrictions WBAT  Pain Assessment  Pain Assessment 0-10  Pain Score 6  Pain Location R knee  Pain Descriptors / Indicators Aching;Sore;Shooting;Grimacing;Guarding  Pain Intervention(s) Limited activity within patient's tolerance;Monitored during session;Premedicated before session;Repositioned;Ice applied  Cognition  Arousal/Alertness Awake/alert  Behavior During Therapy WFL for tasks assessed/performed  Overall Cognitive Status Within Functional Limits for tasks assessed  Bed Mobility  Bed Mobility Sit to Supine  Sit to supine Min guard  General bed mobility comments cues for use of leg lifter to self assist,incr time;(pt reports sleeping in recliner at times)  Transfers  Overall transfer level Needs assistance  Equipment used Rolling walker (2 wheels)  Transfers Sit to/from Stand  Sit to Stand Min guard  General transfer comment cues for hand placement and RLE position  Ambulation/Gait  Ambulation/Gait assistance Min assist  Gait Distance (Feet) 30 Feet  Assistive device Rolling walker (2 wheels)  Gait Pattern/deviations Step-to pattern  General Gait Details cues for sequence and RW position. limited by pain and nausea d/t pain  Balance  Sitting-balance support Bilateral upper extremity  supported;Feet supported  Sitting balance-Leahy Scale Good  Standing balance support Bilateral upper extremity supported;During functional activity;Reliant on assistive device for balance  Standing balance-Leahy Scale Poor (reliant on device for safe standing)  Total Joint Exercises  Ankle Circles/Pumps AROM;Both;10 reps  Quad Sets AROM;Supine;Right;10 reps  Heel Slides AAROM;Supine;Right;10 reps  PT - End of Session  Equipment Utilized During Treatment Gait belt  Activity Tolerance Patient tolerated treatment well  Patient left with call bell/phone within reach;with family/visitor present;in bed;with bed alarm set  Nurse Communication Mobility status   PT - Assessment/Plan  PT Plan Current plan remains appropriate  PT Visit Diagnosis Other abnormalities of gait and mobility (R26.89)  PT Frequency (ACUTE ONLY) 7X/week  Follow Up Recommendations Follow physician's recommendations for discharge plan and follow up therapies  Assistance recommended at discharge Frequent or constant Supervision/Assistance  Patient can return home with the following A little help with walking and/or transfers;A little help with bathing/dressing/bathroom;Assistance with feeding;Assistance with cooking/housework;Assist for transportation;Help with stairs or ramp for entrance  PT equipment None recommended by PT  AM-PAC PT "6 Clicks" Mobility Outcome Measure (Version 2)  Help needed turning from your back to your side while in a flat bed without using bedrails? 3  Help needed moving from lying on your back to sitting on the side of a flat bed without using bedrails? 3  Help needed moving to and from a bed to a chair (including a wheelchair)? 3  Help needed standing up from a chair using your arms (e.g., wheelchair or bedside chair)? 3  Help needed to walk in hospital room? 3  Help needed climbing 3-5 steps with a railing?  2  6 Click Score 17  Consider Recommendation of Discharge To: Home with [facility]  Progressive  Mobility  What is the highest level of mobility based on the progressive mobility assessment? Level 5 (Walks with assist in room/hall) - Balance while stepping forward/back and can walk in room with assist - Complete  Mobility Referral No  Activity Ambulated with assistance in hallway  PT Goal Progression  Progress towards PT goals Progressing toward goals  Acute Rehab PT Goals  PT Goal Formulation With patient  Time For Goal Achievement 01/20/22  Potential to Achieve Goals Good  PT Time Calculation  PT Start Time (ACUTE ONLY) 1516  PT Stop Time (ACUTE ONLY) 1545  PT Time Calculation (min) (ACUTE ONLY) 29 min  PT General Charges  $$ ACUTE PT VISIT 1 Visit  PT Treatments  $Gait Training 8-22 mins  $Therapeutic Exercise 8-22 mins

## 2022-01-07 NOTE — Discharge Summary (Cosign Needed Addendum)
Patient ID: [name] MRN: [id number] DOB/AGE: September 05, 1955 66 y.o.  Admit date: 01/06/2022 Discharge date:01/10/2022  Admission Diagnoses:  Principal Problem:   Primary localized osteoarthritis of right knee Active Problems:   S/P total knee arthroplasty, right   Discharge Diagnoses:  Same  Past Medical History:  Diagnosis Date   Allergic rhinitis    Anginal pain ([place])    Arthritis    "back" (05/07/2016)   Asthma    Atrial flutter ([place])    a. Remotely per notes.   Carpal tunnel syndrome, bilateral    Colon polyps    Diverticulitis    Dizziness    DVT (deep venous thrombosis) ([place]) 1980s x2 -    between birth of two sons. Saw hematology - was told she has a hypercoagulable disorder, should be on Coumadin lifelong.   Dysrhythmia    atrial fib   Family history of adverse reaction to anesthesia    "mother gets PONV"   Gallbladder disease    GERD (gastroesophageal reflux disease)    GIB (gastrointestinal bleeding) 04/14/2018   Halothane adverse reaction    narrow small opening   Heart murmur    Hiatal hernia    History of hiatal hernia    "small one/CTA 05/01/2016" (05/07/2016)   History of kidney stones    HTN (hypertension)    Hypercholesteremia    hx; "brought it down w/diet" (05/07/2016)   Hyperglycemia    a. A1c 6.1 in 2014.   Inguinal hernia    Iron deficiency anemia due to chronic blood loss 04/14/2018   Iron malabsorption 04/14/2018   Left sided sciatica    Normal cardiac stress test 06/2012   Obesity    OSA (obstructive sleep apnea)    a. Mild, did not tolerate CPAP (05/07/2016)   PAF (paroxysmal atrial fibrillation) ([place])    a. s/p afib ablation at [facility] 2010 (Dr. Annabell Howells). b. Recurrent AF s/p DCCV 06/2010. c. On flecainide.    Paroxysmal atrial fibrillation ([place]) 09/09/2009   Qualifier: Diagnosis of  By: Selena Batten CMA, [place]     PONV (postoperative nausea and vomiting)    PPD positive, treated 1977   "treated for 1 yr after exposure to  patient"   Pre-diabetes    RSV infection ~ 2006   Vertigo    Wandering (atrial) pacemaker    a. Remotely per notes.  (  pt. states has a wandering p wave ) no pacemaker    Surgeries: Procedure(s): RIGHT TOTAL KNEE ARTHROPLASTY on 01/06/2022   Consultants:   Discharged Condition: Improved  Hospital Course: MAIZY DAVANZO is an 66 y.o. female who was admitted 01/06/2022 for operative treatment ofPrimary localized osteoarthritis of right knee. Patient has severe unremitting pain that affects sleep, daily activities, and work/hobbies. After pre-op clearance the patient was taken to the operating room on 01/06/2022 and underwent  Procedure(s): RIGHT TOTAL KNEE ARTHROPLASTY.    Patient was given perioperative antibiotics:  Anti-infectives (From admission, onward)    Start     Dose/Rate Route Frequency Ordered Stop   01/06/22 2130  vancomycin (VANCOCIN) IVPB 1000 mg/200 mL premix        1,000 mg 200 mL/hr over 60 Minutes Intravenous Every 12 hours 01/06/22 1231 01/06/22 2330   01/06/22 0600  vancomycin (VANCOCIN) IVPB 1000 mg/200 mL premix        1,000 mg 200 mL/hr over 60 Minutes Intravenous On call to O.R. 01/06/22 8338 01/06/22 0931        Patient was given sequential  compression devices, early ambulation, and chemoprophylaxis to prevent DVT.  Patient benefited maximally from hospital stay and there were no complications.    Recent vital signs: Patient Vitals for the past 24 hrs:  BP Temp Temp src Pulse Resp SpO2  01/07/22 0704 (!) 182/82 -- -- 77 -- --  01/07/22 0537 (!) 174/70 98 F (36.7 C) Oral 82 18 95 %  01/07/22 0129 (!) 162/85 98.3 F (36.8 C) Oral 85 18 97 %  01/06/22 2133 (!) 153/90 98 F (36.7 C) Oral 84 18 95 %  01/06/22 2059 -- -- -- -- -- 95 %  01/06/22 1842 (!) 155/79 98.2 F (36.8 C) Oral 75 17 93 %  01/06/22 1628 (!) 166/73 98 F (36.7 C) -- 73 16 92 %  01/06/22 1600 (!) 147/75 -- -- 69 14 92 %  01/06/22 1545 (!) 161/75 -- -- 72 14 92 %  01/06/22  1530 (!) 161/85 -- -- 69 11 92 %  01/06/22 1515 (!) 159/114 -- -- 82 18 93 %  01/06/22 1500 (!) 152/87 -- -- 80 10 91 %  01/06/22 1445 (!) 163/72 -- -- 74 13 --  01/06/22 1442 -- -- -- -- -- 93 %  01/06/22 1430 (!) 154/82 -- -- 75 13 94 %  01/06/22 1415 (!) 146/47 -- -- 72 13 97 %  01/06/22 1400 (!) 152/82 -- -- 68 11 95 %  01/06/22 1345 (!) 157/76 -- -- 76 19 95 %  01/06/22 1331 -- -- -- 77 11 92 %  01/06/22 1330 (!) 152/76 -- -- 74 15 (!) 83 %  01/06/22 1329 -- -- -- 74 20 96 %  01/06/22 1315 (!) 145/89 -- -- 65 12 93 %  01/06/22 1300 (!) 164/79 -- -- 80 14 91 %  01/06/22 1245 101/85 -- -- 67 15 97 %  01/06/22 1230 (!) 154/70 -- -- 64 11 90 %  01/06/22 1200 (!) 149/75 98 F (36.7 C) -- 70 15 95 %  01/06/22 1145 (!) 150/75 -- -- 71 15 91 %  01/06/22 1130 (!) 142/71 -- -- 75 15 97 %  01/06/22 0901 -- -- -- 65 19 94 %  01/06/22 0856 131/66 -- -- 60 20 98 %  01/06/22 0851 136/62 -- -- 65 (!) 28 95 %  01/06/22 0846 (!) 143/65 -- -- 65 (!) 28 95 %  01/06/22 0841 134/69 -- -- 67 (!) 27 95 %  01/06/22 0837 -- -- -- 67 (!) 25 95 %  01/06/22 0836 134/73 -- -- 64 17 95 %  01/06/22 0835 -- -- -- 64 (!) 21 95 %  01/06/22 0834 -- -- -- 63 (!) 22 95 %  01/06/22 0833 132/70 -- -- 68 (!) 26 95 %  01/06/22 0832 -- -- -- 73 -- 95 %  01/06/22 0830 -- -- -- 71 15 95 %  01/06/22 0829 -- -- -- 74 14 97 %  01/06/22 0828 -- -- -- 70 (!) 21 93 %  01/06/22 0827 133/68 -- -- 67 20 94 %  01/06/22 0826 -- -- -- 67 19 94 %  01/06/22 0825 -- -- -- 64 (!) 22 94 %  01/06/22 0824 -- -- -- 68 15 93 %  01/06/22 0823 -- -- -- 65 14 95 %  01/06/22 0822 (!) 147/61 -- -- 70 14 93 %  01/06/22 0821 -- -- -- 70 18 94 %  01/06/22 0820 -- -- -- 68 15 92 %  01/06/22 0819 -- -- --  63 16 92 %  01/06/22 0818 -- -- -- 64 20 93 %  01/06/22 0817 (!) 141/69 -- -- 66 13 93 %  01/06/22 0816 -- -- -- 72 14 93 %  01/06/22 0815 -- -- -- 72 17 97 %  01/06/22 0814 -- -- -- 70 17 98 %     Recent laboratory studies:  Recent  Labs    01/06/22 0609 01/07/22 0341  WBC  --  17.1*  HGB  --  11.9*  HCT  --  35.6*  PLT  --  347  INR 1.0 1.1     Discharge Medications:   Allergies as of 01/07/2022       Reactions   Aspirin Other (See Comments)   Makes asthma worse and makes stomach hurt   Banana Swelling, Cough   Inside of the mouth swells   Food Other (See Comments), Cough   Nuts- mucus membranes inflamed/cough   Iodinated Contrast Media Anaphylaxis   Required epinephrine. Since then, has tolerated with premed. Other reaction(s): wheeze...requires premdication with prednisone 3 days in advance   Latex Hives, Cough   Wheezing/cough Other reaction(s): hives/wheeze   Meperidine Hcl Other (See Comments)   Projectile vomiting   Peanut-containing Drug Products Swelling, Cough   Inside of the mouth swells   Penicillins Anaphylaxis   Has patient had a PCN reaction causing immediate rash, facial/tongue/throat swelling, SOB or lightheadedness with hypotension:Yes Has patient had a PCN reaction causing severe rash involving mucus membranes or skin necrosis:Yes Has patient had a PCN reaction that required hospitalization:Treated in ER w/epi & breathing treatments Has patient had a PCN reaction occurring within the last 10 years:No If all of the above answers are "NO", then may proceed with Cephalosporin use.   Statins Other (See Comments)   Severe leg pain - has tried most of them   Sulfonamide Derivatives Anaphylaxis   Tape Hives, Dermatitis   No plastic tape!!   Alfuzosin Other (See Comments)   Headache, dizziness   Halothane    Listed under anesthesia adverse reactions pt does not know what reaction or if any at preop on 05/26/21.    Other Other (See Comments)   Vascepa- upset stomach   Sulfamethoxazole Hives        Medication List     TAKE these medications    acetaminophen 650 MG CR tablet Commonly known as: TYLENOL Take 1,300 mg by mouth every 8 (eight) hours as needed for pain.   APPLE  CIDER VINEGAR PO Take 450 mg by mouth in the morning.   cetirizine 10 MG tablet Commonly known as: ZYRTEC Take 10 mg by mouth in the morning.   dicyclomine 10 MG capsule Commonly known as: BENTYL TAKE 1 CAPSULE BY MOUTH 30 MINS BEFORE MEALS AND AT BEDTIME AS NEEDED FOR PAIN/CRAMPING   enoxaparin 100 MG/ML injection Commonly known as: LOVENOX Inject 1 mL (100 mg total) into the skin every 12 (twelve) hours. As instructed by Anticoagulation Clinic   ezetimibe 10 MG tablet Commonly known as: ZETIA TAKE 1 TABLET BY MOUTH EVERY DAY   Flovent HFA 220 MCG/ACT inhaler Generic drug: fluticasone Inhale 1 puff into the lungs 2 (two) times daily.   fluticasone 50 MCG/ACT nasal spray Commonly known as: FLONASE Place 1 spray into both nostrils 2 (two) times daily.   isosorbide mononitrate 30 MG 24 hr tablet Commonly known as: IMDUR TAKE 1 TABLET BY MOUTH EVERY DAY What changed: when to take this   losartan 25 MG tablet  Commonly known as: COZAAR Take 1 tablet (25 mg total) by mouth daily.   meclizine 25 MG tablet Commonly known as: ANTIVERT Take 1 tablet (25 mg total) by mouth 3 (three) times daily as needed for dizziness.   multivitamin with minerals Tabs tablet Take 1 tablet by mouth in the morning.   nitroGLYCERIN 0.4 MG SL tablet Commonly known as: NITROSTAT PLACE 1 TABLET UNDER THE TONGUE EVERY 5 MINUTES AS NEEDED.   omeprazole 40 MG capsule Commonly known as: PRILOSEC Take 1 capsule (40 mg total) by mouth 2 (two) times daily. Decrease to one daily after 6 weeks if symptoms are maintained.   oxyCODONE-acetaminophen 5-325 MG tablet Commonly known as: Percocet Take 1-2 tablets by mouth every 6 (six) hours as needed for severe pain (post op pain).   polyethylene glycol 17 g packet Commonly known as: MiraLax Take 17 g by mouth 2 (two) times daily. Titrate as needed What changed:  when to take this reasons to take this additional instructions   ProAir HFA 108 (90 Base)  MCG/ACT inhaler Generic drug: albuterol Inhale 2 puffs into the lungs every 6 (six) hours as needed for wheezing or shortness of breath.   sucralfate 1 g tablet Commonly known as: Carafate Take 1 tablet before meals up to 3 times daily. What changed:  how much to take how to take this when to take this reasons to take this additional instructions   traMADol 50 MG tablet Commonly known as: ULTRAM Take 1 tablet (50 mg total) by mouth 3 (three) times daily as needed for moderate pain (post op pain). What changed: reasons to take this   venlafaxine XR 37.5 MG 24 hr capsule Commonly known as: EFFEXOR-XR Take 37.5 mg by mouth daily with breakfast.   verapamil 80 MG tablet Commonly known as: CALAN Take 160 mg by mouth 2 (two) times daily.   VITAMIN D3 PO Take 2,000 Units by mouth in the morning.   warfarin 2 MG tablet Commonly known as: COUMADIN Take as directed. If you are unsure how to take this medication, talk to your nurse or doctor. Original instructions: TAKE '4MG'$  DAILY EXCEPT '6MG'$  ON TUESDAYS, THURSDAYS,AND SATURDAYS AS DIRECTED BY ANTICOAGULATION CLINIC What changed:  how much to take how to take this when to take this additional instructions               Durable Medical Equipment  (From admission, onward)           Start     Ordered   01/06/22 1637  DME Walker rolling  Once       Question:  Patient needs a walker to treat with the following condition  Answer:  Primary osteoarthritis of right knee   01/06/22 1636   01/06/22 1637  DME 3 n 1  Once        01/06/22 1636   01/06/22 1637  DME Bedside commode  Once       Question:  Patient needs a bedside commode to treat with the following condition  Answer:  Primary osteoarthritis of right knee   01/06/22 1636            Diagnostic Studies: DG Chest 2 View  Result Date: 12/26/2021 CLINICAL DATA:  Preop for right knee surgery. History of atrial fibrillation and hypertension. EXAM: CHEST - 2 VIEW  COMPARISON:  03/29/2021. FINDINGS: The cardiac silhouette is normal in size and configuration. Normal mediastinal and hilar contours. Clear lungs.  No pleural effusion or pneumothorax. Skeletal  structures are intact. IMPRESSION: No active cardiopulmonary disease. Electronically Signed   By: Lajean Manes M.D.   On: 12/26/2021 10:03    Disposition: Discharge disposition: 01-Home or Self Care       Discharge Instructions     Call MD / Call 911   Complete by: As directed    If you experience chest pain or shortness of breath, CALL 911 and be transported to the hospital emergency room.  If you develope a fever above 101 F, pus (white drainage) or increased drainage or redness at the wound, or calf pain, call your surgeon's office.   Call MD / Call 911   Complete by: As directed    If you experience chest pain or shortness of breath, CALL 911 and be transported to the hospital emergency room.  If you develope a fever above 101 F, pus (white drainage) or increased drainage or redness at the wound, or calf pain, call your surgeon's office.   Constipation Prevention   Complete by: As directed    Drink plenty of fluids.  Prune juice may be helpful.  You may use a stool softener, such as Colace (over the counter) 100 mg twice a day.  Use MiraLax (over the counter) for constipation as needed.   Constipation Prevention   Complete by: As directed    Drink plenty of fluids.  Prune juice may be helpful.  You may use a stool softener, such as Colace (over the counter) 100 mg twice a day.  Use MiraLax (over the counter) for constipation as needed.   Diet - low sodium heart healthy   Complete by: As directed    Diet - low sodium heart healthy   Complete by: As directed    Discharge instructions   Complete by: As directed    INSTRUCTIONS AFTER JOINT REPLACEMENT   Remove items at home which could result in a fall. This includes throw rugs or furniture in walking pathways ICE to the affected joint every  three hours while awake for 30 minutes at a time, for at least the first 3-5 days, and then as needed for pain and swelling.  Continue to use ice for pain and swelling. You may notice swelling that will progress down to the foot and ankle.  This is normal after surgery.  Elevate your leg when you are not up walking on it.   Continue to use the breathing machine you got in the hospital (incentive spirometer) which will help keep your temperature down.  It is common for your temperature to cycle up and down following surgery, especially at night when you are not up moving around and exerting yourself.  The breathing machine keeps your lungs expanded and your temperature down.   DIET:  As you were doing prior to hospitalization, we recommend a well-balanced diet.  DRESSING / WOUND CARE / SHOWERING  You may shower 3 days after surgery, but keep the wounds dry during showering.  You may use an occlusive plastic wrap (Press'n Seal for example), NO SOAKING/SUBMERGING IN THE BATHTUB.  If the bandage gets wet, change with a clean dry gauze.  If the incision gets wet, pat the wound dry with a clean towel.  ACTIVITY  Increase activity slowly as tolerated, but follow the weight bearing instructions below.   No driving for 6 weeks or until further direction given by your physician.  You cannot drive while taking narcotics.  No lifting or carrying greater than 10 lbs. until further directed by  your surgeon. Avoid periods of inactivity such as sitting longer than an hour when not asleep. This helps prevent blood clots.  You may return to work once you are authorized by your doctor.     WEIGHT BEARING   Weight bearing as tolerated with assist device (walker, cane, etc) as directed, use it as long as suggested by your surgeon or therapist, typically at least 4-6 weeks.   EXERCISES  Results after joint replacement surgery are often greatly improved when you follow the exercise, range of motion and muscle  strengthening exercises prescribed by your doctor. Safety measures are also important to protect the joint from further injury. Any time any of these exercises cause you to have increased pain or swelling, decrease what you are doing until you are comfortable again and then slowly increase them. If you have problems or questions, call your caregiver or physical therapist for advice.   Rehabilitation is important following a joint replacement. After just a few days of immobilization, the muscles of the leg can become weakened and shrink (atrophy).  These exercises are designed to build up the tone and strength of the thigh and leg muscles and to improve motion. Often times heat used for twenty to thirty minutes before working out will loosen up your tissues and help with improving the range of motion but do not use heat for the first two weeks following surgery (sometimes heat can increase post-operative swelling).   These exercises can be done on a training (exercise) mat, on the floor, on a table or on a bed. Use whatever works the best and is most comfortable for you.    Use music or television while you are exercising so that the exercises are a pleasant break in your day. This will make your life better with the exercises acting as a break in your routine that you can look forward to.   Perform all exercises about fifteen times, three times per day or as directed.  You should exercise both the operative leg and the other leg as well.  Exercises include:   Quad Sets - Tighten up the muscle on the front of the thigh (Quad) and hold for 5-10 seconds.   Straight Leg Raises - With your knee straight (if you were given a brace, keep it on), lift the leg to 60 degrees, hold for 3 seconds, and slowly lower the leg.  Perform this exercise against resistance later as your leg gets stronger.  Leg Slides: Lying on your back, slowly slide your foot toward your buttocks, bending your knee up off the floor (only go  as far as is comfortable). Then slowly slide your foot back down until your leg is flat on the floor again.  Angel Wings: Lying on your back spread your legs to the side as far apart as you can without causing discomfort.  Hamstring Strength:  Lying on your back, push your heel against the floor with your leg straight by tightening up the muscles of your buttocks.  Repeat, but this time bend your knee to a comfortable angle, and push your heel against the floor.  You may put a pillow under the heel to make it more comfortable if necessary.   A rehabilitation program following joint replacement surgery can speed recovery and prevent re-injury in the future due to weakened muscles. Contact your doctor or a physical therapist for more information on knee rehabilitation.    CONSTIPATION  Constipation is defined medically as fewer than three stools  per week and severe constipation as less than one stool per week.  Even if you have a regular bowel pattern at home, your normal regimen is likely to be disrupted due to multiple reasons following surgery.  Combination of anesthesia, postoperative narcotics, change in appetite and fluid intake all can affect your bowels.   YOU MUST use at least one of the following options; they are listed in order of increasing strength to get the job done.  They are all available over the counter, and you may need to use some, POSSIBLY even all of these options:    Drink plenty of fluids (prune juice may be helpful) and high fiber foods Colace 100 mg by mouth twice a day  Senokot for constipation as directed and as needed Dulcolax (bisacodyl), take with full glass of water  Miralax (polyethylene glycol) once or twice a day as needed.  If you have tried all these things and are unable to have a bowel movement in the first 3-4 days after surgery call either your surgeon or your primary doctor.    If you experience loose stools or diarrhea, hold the medications until you  stool forms back up.  If your symptoms do not get better within 1 week or if they get worse, check with your doctor.  If you experience "the worst abdominal pain ever" or develop nausea or vomiting, please contact the office immediately for further recommendations for treatment.   ITCHING:  If you experience itching with your medications, try taking only a single pain pill, or even half a pain pill at a time.  You can also use Benadryl over the counter for itching or also to help with sleep.   TED HOSE STOCKINGS:  Use stockings on both legs until for at least 2 weeks or as directed by physician office. They may be removed at night for sleeping.  MEDICATIONS:  See your medication summary on the "After Visit Summary" that nursing will review with you.  You may have some home medications which will be placed on hold until you complete the course of blood thinner medication.  It is important for you to complete the blood thinner medication as prescribed.  PRECAUTIONS:  If you experience chest pain or shortness of breath - call 911 immediately for transfer to the hospital emergency department.   If you develop a fever greater that 101 F, purulent drainage from wound, increased redness or drainage from wound, foul odor from the wound/dressing, or calf pain - CONTACT YOUR SURGEON.                                                   FOLLOW-UP APPOINTMENTS:  If you do not already have a post-op appointment, please call the office for an appointment to be seen by your surgeon.  Guidelines for how soon to be seen are listed in your "After Visit Summary", but are typically between 1-4 weeks after surgery.  OTHER INSTRUCTIONS:   Knee Replacement:  Do not place pillow under knee, focus on keeping the knee straight while resting. CPM instructions: 0-90 degrees, 2 hours in the morning, 2 hours in the afternoon, and 2 hours in the evening. Place foam block, curve side up under heel at all times except when in CPM or  when walking.  DO NOT modify, tear, cut, or change the  foam block in any way.  POST-OPERATIVE OPIOID TAPER INSTRUCTIONS: It is important to wean off of your opioid medication as soon as possible. If you do not need pain medication after your surgery it is ok to stop day one. Opioids include: Codeine, Hydrocodone(Norco, Vicodin), Oxycodone(Percocet, oxycontin) and hydromorphone amongst others.  Long term and even short term use of opiods can cause: Increased pain response Dependence Constipation Depression Respiratory depression And more.  Withdrawal symptoms can include Flu like symptoms Nausea, vomiting And more Techniques to manage these symptoms Hydrate well Eat regular healthy meals Stay active Use relaxation techniques(deep breathing, meditating, yoga) Do Not substitute Alcohol to help with tapering If you have been on opioids for less than two weeks and do not have pain than it is ok to stop all together.  Plan to wean off of opioids This plan should start within one week post op of your joint replacement. Maintain the same interval or time between taking each dose and first decrease the dose.  Cut the total daily intake of opioids by one tablet each day Next start to increase the time between doses. The last dose that should be eliminated is the evening dose.     MAKE SURE YOU:  Understand these instructions.  Get help right away if you are not doing well or get worse.    Thank you for letting us be a part of your medical care team.  It is a privilege we respect greatly.  We hope these instructions will help you stay on track for a fast and full recovery!   Discharge instructions   Complete by: As directed    INSTRUCTIONS AFTER JOINT REPLACEMENT   Remove items at home which could result in a fall. This includes throw rugs or furniture in walking pathways ICE to the affected joint every three hours while awake for 30 minutes at a time, for at least the first 3-5  days, and then as needed for pain and swelling.  Continue to use ice for pain and swelling. You may notice swelling that will progress down to the foot and ankle.  This is normal after surgery.  Elevate your leg when you are not up walking on it.   Continue to use the breathing machine you got in the hospital (incentive spirometer) which will help keep your temperature down.  It is common for your temperature to cycle up and down following surgery, especially at night when you are not up moving around and exerting yourself.  The breathing machine keeps your lungs expanded and your temperature down.   DIET:  As you were doing prior to hospitalization, we recommend a well-balanced diet.  DRESSING / WOUND CARE / SHOWERING  You may shower 3 days after surgery, but keep the wounds dry during showering.  You may use an occlusive plastic wrap (Press'n Seal for example), NO SOAKING/SUBMERGING IN THE BATHTUB.  If the bandage gets wet, change with a clean dry gauze.  If the incision gets wet, pat the wound dry with a clean towel.  ACTIVITY  Increase activity slowly as tolerated, but follow the weight bearing instructions below.   No driving for 6 weeks or until further direction given by your physician.  You cannot drive while taking narcotics.  No lifting or carrying greater than 10 lbs. until further directed by your surgeon. Avoid periods of inactivity such as sitting longer than an hour when not asleep. This helps prevent blood clots.  You may return to work once  you are authorized by your doctor.     WEIGHT BEARING   Weight bearing as tolerated with assist device (walker, cane, etc) as directed, use it as long as suggested by your surgeon or therapist, typically at least 4-6 weeks.   EXERCISES  Results after joint replacement surgery are often greatly improved when you follow the exercise, range of motion and muscle strengthening exercises prescribed by your doctor. Safety measures are also  important to protect the joint from further injury. Any time any of these exercises cause you to have increased pain or swelling, decrease what you are doing until you are comfortable again and then slowly increase them. If you have problems or questions, call your caregiver or physical therapist for advice.   Rehabilitation is important following a joint replacement. After just a few days of immobilization, the muscles of the leg can become weakened and shrink (atrophy).  These exercises are designed to build up the tone and strength of the thigh and leg muscles and to improve motion. Often times heat used for twenty to thirty minutes before working out will loosen up your tissues and help with improving the range of motion but do not use heat for the first two weeks following surgery (sometimes heat can increase post-operative swelling).   These exercises can be done on a training (exercise) mat, on the floor, on a table or on a bed. Use whatever works the best and is most comfortable for you.    Use music or television while you are exercising so that the exercises are a pleasant break in your day. This will make your life better with the exercises acting as a break in your routine that you can look forward to.   Perform all exercises about fifteen times, three times per day or as directed.  You should exercise both the operative leg and the other leg as well.  Exercises include:   Quad Sets - Tighten up the muscle on the front of the thigh (Quad) and hold for 5-10 seconds.   Straight Leg Raises - With your knee straight (if you were given a brace, keep it on), lift the leg to 60 degrees, hold for 3 seconds, and slowly lower the leg.  Perform this exercise against resistance later as your leg gets stronger.  Leg Slides: Lying on your back, slowly slide your foot toward your buttocks, bending your knee up off the floor (only go as far as is comfortable). Then slowly slide your foot back down until your  leg is flat on the floor again.  Angel Wings: Lying on your back spread your legs to the side as far apart as you can without causing discomfort.  Hamstring Strength:  Lying on your back, push your heel against the floor with your leg straight by tightening up the muscles of your buttocks.  Repeat, but this time bend your knee to a comfortable angle, and push your heel against the floor.  You may put a pillow under the heel to make it more comfortable if necessary.   A rehabilitation program following joint replacement surgery can speed recovery and prevent re-injury in the future due to weakened muscles. Contact your doctor or a physical therapist for more information on knee rehabilitation.    CONSTIPATION  Constipation is defined medically as fewer than three stools per week and severe constipation as less than one stool per week.  Even if you have a regular bowel pattern at home, your normal regimen is likely  to be disrupted due to multiple reasons following surgery.  Combination of anesthesia, postoperative narcotics, change in appetite and fluid intake all can affect your bowels.   YOU MUST use at least one of the following options; they are listed in order of increasing strength to get the job done.  They are all available over the counter, and you may need to use some, POSSIBLY even all of these options:    Drink plenty of fluids (prune juice may be helpful) and high fiber foods Colace 100 mg by mouth twice a day  Senokot for constipation as directed and as needed Dulcolax (bisacodyl), take with full glass of water  Miralax (polyethylene glycol) once or twice a day as needed.  If you have tried all these things and are unable to have a bowel movement in the first 3-4 days after surgery call either your surgeon or your primary doctor.    If you experience loose stools or diarrhea, hold the medications until you stool forms back up.  If your symptoms do not get better within 1 week or if  they get worse, check with your doctor.  If you experience "the worst abdominal pain ever" or develop nausea or vomiting, please contact the office immediately for further recommendations for treatment.   ITCHING:  If you experience itching with your medications, try taking only a single pain pill, or even half a pain pill at a time.  You can also use Benadryl over the counter for itching or also to help with sleep.   TED HOSE STOCKINGS:  Use stockings on both legs until for at least 2 weeks or as directed by physician office. They may be removed at night for sleeping.  MEDICATIONS:  See your medication summary on the "After Visit Summary" that nursing will review with you.  You may have some home medications which will be placed on hold until you complete the course of blood thinner medication.  It is important for you to complete the blood thinner medication as prescribed.  PRECAUTIONS:  If you experience chest pain or shortness of breath - call 911 immediately for transfer to the hospital emergency department.   If you develop a fever greater that 101 F, purulent drainage from wound, increased redness or drainage from wound, foul odor from the wound/dressing, or calf pain - CONTACT YOUR SURGEON.                                                   FOLLOW-UP APPOINTMENTS:  If you do not already have a post-op appointment, please call the office for an appointment to be seen by your surgeon.  Guidelines for how soon to be seen are listed in your "After Visit Summary", but are typically between 1-4 weeks after surgery.  OTHER INSTRUCTIONS:   Knee Replacement:  Do not place pillow under knee, focus on keeping the knee straight while resting. CPM instructions: 0-90 degrees, 2 hours in the morning, 2 hours in the afternoon, and 2 hours in the evening. Place foam block, curve side up under heel at all times except when in CPM or when walking.  DO NOT modify, tear, cut, or change the foam block in any  way.  POST-OPERATIVE OPIOID TAPER INSTRUCTIONS: It is important to wean off of your opioid medication as soon as possible. If you do not  need pain medication after your surgery it is ok to stop day one. Opioids include: Codeine, Hydrocodone(Norco, Vicodin), Oxycodone(Percocet, oxycontin) and hydromorphone amongst others.  Long term and even short term use of opiods can cause: Increased pain response Dependence Constipation Depression Respiratory depression And more.  Withdrawal symptoms can include Flu like symptoms Nausea, vomiting And more Techniques to manage these symptoms Hydrate well Eat regular healthy meals Stay active Use relaxation techniques(deep breathing, meditating, yoga) Do Not substitute Alcohol to help with tapering If you have been on opioids for less than two weeks and do not have pain than it is ok to stop all together.  Plan to wean off of opioids This plan should start within one week post op of your joint replacement. Maintain the same interval or time between taking each dose and first decrease the dose.  Cut the total daily intake of opioids by one tablet each day Next start to increase the time between doses. The last dose that should be eliminated is the evening dose.     MAKE SURE YOU:  Understand these instructions.  Get help right away if you are not doing well or get worse.    Thank you for letting us be a part of your medical care team.  It is a privilege we respect greatly.  We hope these instructions will help you stay on track for a fast and full recovery!   Increase activity slowly as tolerated   Complete by: As directed    Increase activity slowly as tolerated   Complete by: As directed    Post-operative opioid taper instructions:   Complete by: As directed    POST-OPERATIVE OPIOID TAPER INSTRUCTIONS: It is important to wean off of your opioid medication as soon as possible. If you do not need pain medication after your surgery it is  ok to stop day one. Opioids include: Codeine, Hydrocodone(Norco, Vicodin), Oxycodone(Percocet, oxycontin) and hydromorphone amongst others.  Long term and even short term use of opiods can cause: Increased pain response Dependence Constipation Depression Respiratory depression And more.  Withdrawal symptoms can include Flu like symptoms Nausea, vomiting And more Techniques to manage these symptoms Hydrate well Eat regular healthy meals Stay active Use relaxation techniques(deep breathing, meditating, yoga) Do Not substitute Alcohol to help with tapering If you have been on opioids for less than two weeks and do not have pain than it is ok to stop all together.  Plan to wean off of opioids This plan should start within one week post op of your joint replacement. Maintain the same interval or time between taking each dose and first decrease the dose.  Cut the total daily intake of opioids by one tablet each day Next start to increase the time between doses. The last dose that should be eliminated is the evening dose.      Post-operative opioid taper instructions:   Complete by: As directed    POST-OPERATIVE OPIOID TAPER INSTRUCTIONS: It is important to wean off of your opioid medication as soon as possible. If you do not need pain medication after your surgery it is ok to stop day one. Opioids include: Codeine, Hydrocodone(Norco, Vicodin), Oxycodone(Percocet, oxycontin) and hydromorphone amongst others.  Long term and even short term use of opiods can cause: Increased pain response Dependence Constipation Depression Respiratory depression And more.  Withdrawal symptoms can include Flu like symptoms Nausea, vomiting And more Techniques to manage these symptoms Hydrate well Eat regular healthy meals Stay active Use relaxation techniques(deep breathing, meditating,  yoga) Do Not substitute Alcohol to help with tapering If you have been on opioids for less than two weeks  and do not have pain than it is ok to stop all together.  Plan to wean off of opioids This plan should start within one week post op of your joint replacement. Maintain the same interval or time between taking each dose and first decrease the dose.  Cut the total daily intake of opioids by one tablet each day Next start to increase the time between doses. The last dose that should be eliminated is the evening dose.           Follow-up Information     Melrose Nakayama, MD. Go on 01/16/2022.   Specialty: Orthopedic Surgery Why: Your appointment has been scheduled for 10:00 Contact information: [place] [address] [phone]         [facility] Go on 01/08/2022.   Why: Your outpatient physical therapy appointment is at 2:40. Please arrive at 2:20 to complete your paperwork Contact information: [facility] [place] [address] [phone]                  Signed: Larwance Sachs Andres Bantz 01/07/2022, 8:13 AM

## 2022-01-08 LAB — CBC
HCT: 34.4 % — ABNORMAL LOW (ref 36.0–46.0)
Hemoglobin: 11.2 g/dL — ABNORMAL LOW (ref 12.0–15.0)
MCH: 30.2 pg (ref 26.0–34.0)
MCHC: 32.6 g/dL (ref 30.0–36.0)
MCV: 92.7 fL (ref 80.0–100.0)
Platelets: 342 10*3/uL (ref 150–400)
RBC: 3.71 MIL/uL — ABNORMAL LOW (ref 3.87–5.11)
RDW: 12.8 % (ref 11.5–15.5)
WBC: 13 10*3/uL — ABNORMAL HIGH (ref 4.0–10.5)
nRBC: 0 % (ref 0.0–0.2)

## 2022-01-08 LAB — PROTIME-INR
INR: 1.2 (ref 0.8–1.2)
Prothrombin Time: 15.5 seconds — ABNORMAL HIGH (ref 11.4–15.2)

## 2022-01-08 LAB — CREATININE, SERUM
Creatinine, Ser: 0.67 mg/dL (ref 0.44–1.00)
GFR, Estimated: >60 mL/min (ref >60)

## 2022-01-08 MED ORDER — WARFARIN SODIUM 4 MG PO TABS
4.0000 mg | ORAL_TABLET | Freq: Once | ORAL | Status: AC
  Administered 2022-01-08: 4 mg via ORAL
  Filled 2022-01-08: qty 1

## 2022-01-08 MED ORDER — KETOROLAC TROMETHAMINE 15 MG/ML IJ SOLN: 15.0000 mg | Freq: Once | INTRAMUSCULAR | Status: DC

## 2022-01-08 NOTE — Plan of Care (Signed)
  Problem: Education: Goal: Knowledge of the prescribed therapeutic regimen will improve Outcome: Progressing Goal: Individualized Educational Video(s) Outcome: Progressing   Problem: Activity: Goal: Ability to avoid complications of mobility impairment will improve Outcome: Progressing Goal: Range of joint motion will improve Outcome: Progressing   Problem: Clinical Measurements: Goal: Postoperative complications will be avoided or minimized Outcome: Progressing   Problem: Pain Management: Goal: Pain level will decrease with appropriate interventions Outcome: Progressing   Problem: Skin Integrity: Goal: Will show signs of wound healing Outcome: Progressing   Problem: Education: Goal: Knowledge of General Education information will improve Description: Including pain rating scale, medication(s)/side effects and non-pharmacologic comfort measures Outcome: Progressing   Problem: Health Behavior/Discharge Planning: Goal: Ability to manage health-related needs will improve Outcome: Progressing   Problem: Clinical Measurements: Goal: Ability to maintain clinical measurements within normal limits will improve Outcome: Progressing Goal: Will remain free from infection Outcome: Progressing Goal: Diagnostic test results will improve Outcome: Progressing Goal: Respiratory complications will improve Outcome: Progressing Goal: Cardiovascular complication will be avoided Outcome: Progressing   Problem: Activity: Goal: Risk for activity intolerance will decrease Outcome: Progressing   Problem: Nutrition: Goal: Adequate nutrition will be maintained Outcome: Progressing   Problem: Pain Managment: Goal: General experience of comfort will improve Outcome: Progressing   Problem: Safety: Goal: Ability to remain free from injury will improve Outcome: Progressing   Problem: Skin Integrity: Goal: Risk for impaired skin integrity will decrease Outcome: Progressing

## 2022-01-08 NOTE — Progress Notes (Signed)
Physical Therapy Treatment Patient Details Name: [name] MRN: [id number] DOB: 03-Jan-1956 Today's Date: 01/08/2022   History of Present Illness 66 y/o admit for s/p RTKA 01/06/2022 PMH:HTN, GERD. arthritis, pacemaker    PT Comments    Pt very cooperative but limited by pain with activity despite premed.  Pt tolerating min WB on RL with gait and able to ambulate very short distance only - RN aware.  Recommendations for follow up therapy are one component of a multi-disciplinary discharge planning process, led by the attending physician.  Recommendations may be updated based on patient status, additional functional criteria and insurance authorization.  Follow Up Recommendations  Follow physician's recommendations for discharge plan and follow up therapies     Assistance Recommended at Discharge Frequent or constant Supervision/Assistance  Patient can return home with the following A little help with walking and/or transfers;A little help with bathing/dressing/bathroom;Assistance with feeding;Assistance with cooking/housework;Assist for transportation;Help with stairs or ramp for entrance   Equipment Recommendations  None recommended by PT    Recommendations for Other Services       Precautions / Restrictions Precautions Precautions: Knee Precaution Comments: educated with knee precautions avoiding pillow under knee at rest to enhance knee extension. Restrictions Weight Bearing Restrictions: No Other Position/Activity Restrictions: WBAT     Mobility  Bed Mobility Overal bed mobility: Needs Assistance Bed Mobility: Supine to Sit     Supine to sit: Min guard     General bed mobility comments: Increased time with pt using gait belt to assist R LE    Transfers Overall transfer level: Needs assistance Equipment used: Rolling walker (2 wheels) Transfers: Sit to/from Stand Sit to Stand: Min guard           General transfer comment: cues for hand placement and  RLE position    Ambulation/Gait Ambulation/Gait assistance: Min assist Gait Distance (Feet): 6 Feet Assistive device: Rolling walker (2 wheels) Gait Pattern/deviations: Step-to pattern, Decreased step length - right, Decreased step length - left, Shuffle, Trunk flexed, Antalgic, Decreased stance time - right Gait velocity: decr     General Gait Details: cues for sequence and RW position. limited by pain   Stairs             Wheelchair Mobility    Modified Rankin (Stroke Patients Only)       Balance Overall balance assessment: Needs assistance Sitting-balance support: Feet supported, No upper extremity supported Sitting balance-Leahy Scale: Good     Standing balance support: Bilateral upper extremity supported, During functional activity, Reliant on assistive device for balance Standing balance-Leahy Scale: Poor                              Cognition Arousal/Alertness: Awake/alert Behavior During Therapy: WFL for tasks assessed/performed Overall Cognitive Status: Within Functional Limits for tasks assessed                                          Exercises Total Joint Exercises Ankle Circles/Pumps: AROM, Both, 15 reps, Supine Quad Sets: AROM, Supine, Right, 10 reps Heel Slides: AAROM, Supine, Right, 10 reps Straight Leg Raises: AAROM, Supine, Right, 10 reps Goniometric ROM: -5 - 40    General Comments        Pertinent Vitals/Pain Pain Assessment Pain Assessment: 0-10 Pain Score: 8  Pain Location: R knee Pain Descriptors /  Indicators: Aching, Sore, Shooting, Grimacing, Guarding Pain Intervention(s): Limited activity within patient's tolerance, Monitored during session, Premedicated before session, Ice applied    Home Living                          Prior Function            PT Goals (current goals can now be found in the care plan section) Acute Rehab PT Goals Patient Stated Goal: I want to be able to be  with my grandkids PT Goal Formulation: With patient Time For Goal Achievement: 01/20/22 Potential to Achieve Goals: Good Progress towards PT goals: Progressing toward goals    Frequency    7X/week      PT Plan Current plan remains appropriate    Co-evaluation              AM-PAC PT "6 Clicks" Mobility   Outcome Measure  Help needed turning from your back to your side while in a flat bed without using bedrails?: A Little Help needed moving from lying on your back to sitting on the side of a flat bed without using bedrails?: A Little Help needed moving to and from a bed to a chair (including a wheelchair)?: A Little Help needed standing up from a chair using your arms (e.g., wheelchair or bedside chair)?: A Little Help needed to walk in hospital room?: Total Help needed climbing 3-5 steps with a railing? : Total 6 Click Score: 14    End of Session Equipment Utilized During Treatment: Gait belt Activity Tolerance: Patient limited by pain Patient left: with call bell/phone within reach;in chair;with chair alarm set Nurse Communication: Mobility status PT Visit Diagnosis: Other abnormalities of gait and mobility (R26.89)     Time: 9450-3888 PT Time Calculation (min) (ACUTE ONLY): 29 min  Charges:  $Gait Training: 8-22 mins $Therapeutic Exercise: 8-22 mins                     [place] Pager [phone] Office [phone]    Ayomide Purdy 01/08/2022, 1:16 PM

## 2022-01-08 NOTE — Care Plan (Signed)
Ortho Bundle Case Management Note  Patient Details  Name: [name] MRN: [id number] Date of Birth: February 23, 1956   Spoke with PA this am. Patient will need HHPT. Referral to [facility]. They will see her at discharge. Daily if needed               DME Arranged:  N/A DME Agency:  NA  HH Arranged:  PT HH Agency:  [facility]  Additional Comments: Please contact me with any questions of if this plan should need to change.  Ladell Heads,  [place] Specialist  [phone] 01/08/2022, 10:40 AM

## 2022-01-08 NOTE — TOC Transition Note (Signed)
Transition of Care [facility]) - CM/SW Discharge Note   Patient Details  Name: [name] MRN: [id number] Date of Birth: 09/07/1955  Transition of Care [facility]) CM/SW Contact:  Lennart Pall, LCSW Phone Number: 01/08/2022, 9:57 AM   Clinical Narrative:     Alerted by PT that dc plan has changed to home with [facility].  Pt agreeable and has no HH agency preference - referral placed and accepted with [facility].    Final next level of care: [place] Barriers to Discharge: No Barriers Identified   Patient Goals and CMS Choice Patient states their goals for this hospitalization and ongoing recovery are:: return home      Discharge Placement                       Discharge Plan and Services                DME Arranged: N/A DME Agency: NA       HH Arranged: PT HH Agency: [place] Date [place]: 01/08/22 Time [place]: [id number] Representative spoke with at [place]: [place] Determinants of Health ([place]) Interventions     Readmission Risk Interventions    01/07/2022   10:18 AM  Readmission Risk Prevention Plan  Post Dischage Appt Complete  Medication Screening Complete  Transportation Screening Complete

## 2022-01-08 NOTE — Progress Notes (Signed)
Subjective: 2 Days Post-Op Procedure(s) (LRB): RIGHT TOTAL KNEE ARTHROPLASTY (Right)  Pain is bette controlled this morning. Patient is hopeful that she can go home today.   Activity level:  wbat Diet tolerance:  ok Voiding:  ok Patient reports pain as mild and moderate.    Objective: Vital signs in last 24 hours: Temp:  [98 F (36.7 C)-99 F (37.2 C)] 99 F (37.2 C) (10/19 0458) Pulse Rate:  [72-81] 77 (10/19 0458) Resp:  [18] 18 (10/19 0458) BP: (142-182)/(67-114) 142/67 (10/19 0458) SpO2:  [91 %-98 %] 91 % (10/19 0458)  Labs: Recent Labs    01/07/22 0341 01/08/22 0337  HGB 11.9* 11.2*   Recent Labs    01/07/22 0341 01/08/22 0337  WBC 17.1* 13.0*  RBC 3.88 3.71*  HCT 35.6* 34.4*  PLT 347 342   Recent Labs    01/08/22 0337  CREATININE 0.67   Recent Labs    01/07/22 0341 01/08/22 0337  INR 1.1 1.2    Physical Exam:  Neurologically intact ABD soft Neurovascular intact Sensation intact distally Intact pulses distally Dorsiflexion/Plantar flexion intact Incision: dressing C/D/I and no drainage No cellulitis present Compartment soft  Assessment/Plan:  2 Days Post-Op Procedure(s) (LRB): RIGHT TOTAL KNEE ARTHROPLASTY (Right) Advance diet Up with therapy Discharge home with home health today if cleared by PT and doing well. Resume home coumadin and bridge with lovenox until therapeutic INR. Follow up in office 2 weeks post op.  [name] 01/08/2022, 6:29 AM

## 2022-01-08 NOTE — Progress Notes (Signed)
Orthopedic Tech Progress Note Patient Details:  [name] [birth year] [id number]  Patient ID: [name], female   DOB: [birth year], 66 y.o.   MRN: [id number]  [name] 01/08/2022, 9:45 AM Knee immobilizer ordered by PT for patient when walking

## 2022-01-08 NOTE — Progress Notes (Signed)
ANTICOAGULATION CONSULT NOTE - Follow Up  Pharmacy Consult for Warfarin Indication: atrial fibrillation, hx of DVT  Allergies  Allergen Reactions   Aspirin Other (See Comments)    Makes asthma worse and makes stomach hurt   Banana Swelling and Cough    Inside of the mouth swells   Food Other (See Comments) and Cough    Nuts- mucus membranes inflamed/cough    Iodinated Contrast Media Anaphylaxis    Required epinephrine. Since then, has tolerated with premed. Other reaction(s): wheeze...requires premdication with prednisone 3 days in advance   Latex Hives and Cough    Wheezing/cough  Other reaction(s): hives/wheeze   Meperidine Hcl Other (See Comments)    Projectile vomiting    Peanut-Containing Drug Products Swelling and Cough    Inside of the mouth swells   Penicillins Anaphylaxis    Has patient had a PCN reaction causing immediate rash, facial/tongue/throat swelling, SOB or lightheadedness with hypotension:Yes Has patient had a PCN reaction causing severe rash involving mucus membranes or skin necrosis:Yes Has patient had a PCN reaction that required hospitalization:Treated in ER w/epi & breathing treatments Has patient had a PCN reaction occurring within the last 10 years:No If all of the above answers are "NO", then may proceed with Cephalosporin use.    Statins Other (See Comments)    Severe leg pain - has tried most of them   Sulfonamide Derivatives Anaphylaxis   Tape Hives and Dermatitis    No plastic tape!!   Alfuzosin Other (See Comments)    Headache, dizziness   Halothane     Listed under anesthesia adverse reactions pt does not know what reaction or if any at preop on 05/26/21.    Other Other (See Comments)    Vascepa- upset stomach   Sulfamethoxazole Hives    Patient Measurements: Height: '5\' 4"'$  (162.6 cm) Weight: 101.6 kg (224 lb) IBW/kg (Calculated) : 54.7  Vital Signs: Temp: 99 F (37.2 C) (10/19 0458) BP: 142/67 (10/19 0458) Pulse Rate: 77 (10/19  0458)  Labs: Recent Labs    01/06/22 0609 01/07/22 0341 01/08/22 0337  HGB  --  11.9* 11.2*  HCT  --  35.6* 34.4*  PLT  --  347 342  LABPROT 13.4 14.3 15.5*  INR 1.0 1.1 1.2  CREATININE  --   --  0.67     Estimated Creatinine Clearance: 79.2 mL/min (by C-G formula based on SCr of 0.67 mg/dL).   Medical History: Past Medical History:  Diagnosis Date   Allergic rhinitis    Anginal pain ([place])    Arthritis    "back" (05/07/2016)   Asthma    Atrial flutter ([place])    a. Remotely per notes.   Carpal tunnel syndrome, bilateral    Colon polyps    Diverticulitis    Dizziness    DVT (deep venous thrombosis) ([place]) 1980s x2 -    between birth of two sons. Saw hematology - was told she has a hypercoagulable disorder, should be on Coumadin lifelong.   Dysrhythmia    atrial fib   Family history of adverse reaction to anesthesia    "mother gets PONV"   Gallbladder disease    GERD (gastroesophageal reflux disease)    GIB (gastrointestinal bleeding) 04/14/2018   Halothane adverse reaction    narrow small opening   Heart murmur    Hiatal hernia    History of hiatal hernia    "small one/CTA 05/01/2016" (05/07/2016)   History of kidney stones    HTN (hypertension)  Hypercholesteremia    hx; "brought it down w/diet" (05/07/2016)   Hyperglycemia    a. A1c 6.1 in 2014.   Inguinal hernia    Iron deficiency anemia due to chronic blood loss 04/14/2018   Iron malabsorption 04/14/2018   Left sided sciatica    Normal cardiac stress test 06/2012   Obesity    OSA (obstructive sleep apnea)    a. Mild, did not tolerate CPAP (05/07/2016)   PAF (paroxysmal atrial fibrillation) ([place])    a. s/p afib ablation at [facility] 2010 (Dr. Annabell Howells). b. Recurrent AF s/p DCCV 06/2010. c. On flecainide.    Paroxysmal atrial fibrillation ([place]) 09/09/2009   Qualifier: Diagnosis of  By: Selena Batten CMA, Jewel     PONV (postoperative nausea and vomiting)    PPD positive, treated 1977   "treated for 1 yr after  exposure to patient"   Pre-diabetes    RSV infection ~ 2006   Vertigo    Wandering (atrial) pacemaker    a. Remotely per notes.  (  pt. states has a wandering p wave ) no pacemaker    Assessment: 66 y/o F with PMH of atrial fibrillation, hx of DVT now s/p right total knee arthroplasty. Patient on warfarin PTA ('6mg'$  every Tues/Sat, '4mg'$  all other days of the week)-last dose 12/31/21. Patient bridged with Enoxaparin '100mg'$  SQ q12h - last dose prior to surgery was 01/05/22 AM. Pharmacy consulted to resume warfarin post-operatively.  Anticoagulation Clinic note on 12/24/21 details specific plan for peri-operative warfarin dosing and enoxaparin bridging. Pharmacy discussed with ortho on 10/17 - confirmed to proceed with enoxaparin bridge starting POD#1.  Today, 01/08/22 INR = 1.2 remains subtherapeutic as expected after warfarin held pre-op CBC: Hgb (11.2) slightly low post-op, Plt WNL SCr 0.67, CrCl ~75 mL/min TBW ~102 kg, BMI = 38  Goal of Therapy:  INR 2-3 Monitor platelets by anticoagulation protocol: Yes   Plan:  Resume home dose with warfarin 4 mg PO once today per anticoagulation clinic note/plan Continue enoxaparin 100 mg (1 mg/kg) subQ q12h bridge until INR therapeutic. Could consider checking anti-Xa level once at steady state. Daily PT/INR CBC at least q72h while hospitalized.  Monitor for signs and symptoms of bleeding  Lenis Noon, PharmD 01/08/22 10:35 AM

## 2022-01-08 NOTE — Progress Notes (Signed)
Physical Therapy Treatment Patient Details Name: [name] MRN: [id number] DOB: 11-10-1955 Today's Date: 01/08/2022   History of Present Illness 66 y/o admit for s/p RTKA 01/06/2022 PMH:HTN, GERD. arthritis, pacemaker    PT Comments    Pt continues very cooperative but pain limited despite premed.  Pt up to ambulate increased, but still very limited, distance in room and assisted to bed.  Pt with noted improvement in stability and WB tolerance with KI in place - don/doff KI reviewed.   Recommendations for follow up therapy are one component of a multi-disciplinary discharge planning process, led by the attending physician.  Recommendations may be updated based on patient status, additional functional criteria and insurance authorization.  Follow Up Recommendations  Follow physician's recommendations for discharge plan and follow up therapies     Assistance Recommended at Discharge Frequent or constant Supervision/Assistance  Patient can return home with the following A little help with walking and/or transfers;A little help with bathing/dressing/bathroom;Assistance with feeding;Assistance with cooking/housework;Assist for transportation;Help with stairs or ramp for entrance   Equipment Recommendations  None recommended by PT    Recommendations for Other Services       Precautions / Restrictions Precautions Precautions: Knee Precaution Booklet Issued: No Precaution Comments: educated with knee precautions avoiding pillow under knee at rest to enhance knee extension. Required Braces or Orthoses: Knee Immobilizer - Right Knee Immobilizer - Right: Discontinue once straight leg raise with < 10 degree lag Restrictions Weight Bearing Restrictions: No Other Position/Activity Restrictions: WBAT     Mobility  Bed Mobility Overal bed mobility: Needs Assistance Bed Mobility: Sit to Supine     Supine to sit: Min guard Sit to supine: Min assist   General bed mobility  comments: assist to bring R LE into bed    Transfers Overall transfer level: Needs assistance Equipment used: Rolling walker (2 wheels) Transfers: Sit to/from Stand Sit to Stand: Min assist           General transfer comment: Up from low chair; cues for hand placement and RLE position    Ambulation/Gait Ambulation/Gait assistance: Min assist Gait Distance (Feet): 15 Feet Assistive device: Rolling walker (2 wheels) Gait Pattern/deviations: Step-to pattern, Decreased step length - right, Decreased step length - left, Shuffle, Trunk flexed, Antalgic, Decreased stance time - right Gait velocity: decr     General Gait Details: cues for sequence and RW position. limited by pain   Stairs             Wheelchair Mobility    Modified Rankin (Stroke Patients Only)       Balance Overall balance assessment: Needs assistance Sitting-balance support: Feet supported, No upper extremity supported Sitting balance-Leahy Scale: Good     Standing balance support: Bilateral upper extremity supported, During functional activity, Reliant on assistive device for balance Standing balance-Leahy Scale: Poor                              Cognition Arousal/Alertness: Awake/alert Behavior During Therapy: WFL for tasks assessed/performed Overall Cognitive Status: Within Functional Limits for tasks assessed                                          Exercises Total Joint Exercises Ankle Circles/Pumps: AROM, Both, 15 reps, Supine Quad Sets: AROM, Supine, Right, 10 reps Heel Slides: AAROM, Supine, Right, 10 reps  Straight Leg Raises: AAROM, Supine, Right, 10 reps Goniometric ROM: -5 - 40    General Comments        Pertinent Vitals/Pain Pain Assessment Pain Assessment: 0-10 Pain Score: 9  Pain Location: R knee Pain Descriptors / Indicators: Aching, Sore, Shooting, Grimacing, Guarding Pain Intervention(s): Limited activity within patient's tolerance,  Monitored during session, Premedicated before session (pt declines ice at this time)    Home Living                          Prior Function            PT Goals (current goals can now be found in the care plan section) Acute Rehab PT Goals Patient Stated Goal: I want to be able to be with my grandkids PT Goal Formulation: With patient Time For Goal Achievement: 01/20/22 Potential to Achieve Goals: Good Progress towards PT goals: Progressing toward goals    Frequency    7X/week      PT Plan Current plan remains appropriate    Co-evaluation              AM-PAC PT "6 Clicks" Mobility   Outcome Measure  Help needed turning from your back to your side while in a flat bed without using bedrails?: A Little Help needed moving from lying on your back to sitting on the side of a flat bed without using bedrails?: A Little Help needed moving to and from a bed to a chair (including a wheelchair)?: A Little Help needed standing up from a chair using your arms (e.g., wheelchair or bedside chair)?: A Little Help needed to walk in hospital room?: A Lot Help needed climbing 3-5 steps with a railing? : Total 6 Click Score: 15    End of Session Equipment Utilized During Treatment: Gait belt Activity Tolerance: Patient limited by pain Patient left: in bed;with call bell/phone within reach;with family/visitor present Nurse Communication: Mobility status PT Visit Diagnosis: Other abnormalities of gait and mobility (R26.89)     Time: 4481-8563 PT Time Calculation (min) (ACUTE ONLY): 25 min  Charges:  $Gait Training: 8-22 mins $Therapeutic Exercise: 8-22 mins $Therapeutic Activity: 8-22 mins                     [place] Pager [phone] Office [phone]    Baraka Klatt 01/08/2022, 1:22 PM

## 2022-01-09 ENCOUNTER — Other Ambulatory Visit: Payer: Self-pay

## 2022-01-09 LAB — PROTIME-INR
INR: 1.2 (ref 0.8–1.2)
Prothrombin Time: 15.3 seconds — ABNORMAL HIGH (ref 11.4–15.2)

## 2022-01-09 LAB — HEPARIN ANTI-XA: Heparin LMW: 0.89 IU/mL

## 2022-01-09 MED ORDER — WARFARIN SODIUM 4 MG PO TABS
4.0000 mg | ORAL_TABLET | ORAL | Status: DC
  Administered 2022-01-09: 4 mg via ORAL
  Filled 2022-01-09: qty 1

## 2022-01-09 MED ORDER — WARFARIN SODIUM 6 MG PO TABS
6.0000 mg | ORAL_TABLET | ORAL | Status: DC
  Filled 2022-01-09: qty 1

## 2022-01-09 NOTE — Progress Notes (Signed)
Subjective: 3 Days Post-Op Procedure(s) (LRB): RIGHT TOTAL KNEE ARTHROPLASTY (Right)  Patient feeling better today and is hoping to go home.  Activity level:  wbat Diet tolerance:  ok Voiding:  ok Patient reports pain as mild.    Objective: Vital signs in last 24 hours: Temp:  [98.3 F (36.8 C)-99.4 F (37.4 C)] 99.4 F (37.4 C) (10/20 0527) Pulse Rate:  [83-88] 87 (10/20 0527) Resp:  [17-18] 18 (10/20 0527) BP: (141-177)/(65-79) 141/65 (10/20 0527) SpO2:  [92 %-96 %] 92 % (10/20 0527)  Labs: Recent Labs    01/07/22 0341 01/08/22 0337  HGB 11.9* 11.2*   Recent Labs    01/07/22 0341 01/08/22 0337  WBC 17.1* 13.0*  RBC 3.88 3.71*  HCT 35.6* 34.4*  PLT 347 342   Recent Labs    01/08/22 0337  CREATININE 0.67   Recent Labs    01/08/22 0337 01/09/22 0350  INR 1.2 1.2    Physical Exam:  Neurologically intact ABD soft Neurovascular intact Sensation intact distally Intact pulses distally Dorsiflexion/Plantar flexion intact Incision: dressing C/D/I and no drainage No cellulitis present Compartment soft  Assessment/Plan:  3 Days Post-Op Procedure(s) (LRB): RIGHT TOTAL KNEE ARTHROPLASTY (Right) Advance diet Up with therapy Discharge home with home health today after pt. Follow up in office 2 weeks post op. Continue on baseline coumadin.    Larwance Sachs Tunya Held 01/09/2022, 8:07 AM

## 2022-01-09 NOTE — Progress Notes (Signed)
ANTICOAGULATION CONSULT NOTE - Follow Up  Pharmacy Consult for Warfarin Indication: atrial fibrillation, hx of DVT  Allergies  Allergen Reactions   Aspirin Other (See Comments)    Makes asthma worse and makes stomach hurt   Banana Swelling and Cough    Inside of the mouth swells   Food Other (See Comments) and Cough    Nuts- mucus membranes inflamed/cough    Iodinated Contrast Media Anaphylaxis    Required epinephrine. Since then, has tolerated with premed. Other reaction(s): wheeze...requires premdication with prednisone 3 days in advance   Latex Hives and Cough    Wheezing/cough  Other reaction(s): hives/wheeze   Meperidine Hcl Other (See Comments)    Projectile vomiting    Peanut-Containing Drug Products Swelling and Cough    Inside of the mouth swells   Penicillins Anaphylaxis    Has patient had a PCN reaction causing immediate rash, facial/tongue/throat swelling, SOB or lightheadedness with hypotension:Yes Has patient had a PCN reaction causing severe rash involving mucus membranes or skin necrosis:Yes Has patient had a PCN reaction that required hospitalization:Treated in ER w/epi & breathing treatments Has patient had a PCN reaction occurring within the last 10 years:No If all of the above answers are "NO", then may proceed with Cephalosporin use.    Statins Other (See Comments)    Severe leg pain - has tried most of them   Sulfonamide Derivatives Anaphylaxis   Tape Hives and Dermatitis    No plastic tape!!   Alfuzosin Other (See Comments)    Headache, dizziness   Halothane     Listed under anesthesia adverse reactions pt does not know what reaction or if any at preop on 05/26/21.    Other Other (See Comments)    Vascepa- upset stomach   Sulfamethoxazole Hives    Patient Measurements: Height: '5\' 4"'$  (162.6 cm) Weight: 101.6 kg (224 lb) IBW/kg (Calculated) : 54.7  Vital Signs: Temp: 99.4 F (37.4 C) (10/20 0527) Temp Source: Oral (10/20 0527) BP: 141/65  (10/20 0527) Pulse Rate: 87 (10/20 0527)  Labs: Recent Labs    01/07/22 0341 01/08/22 0337 01/09/22 0350  HGB 11.9* 11.2*  --   HCT 35.6* 34.4*  --   PLT 347 342  --   LABPROT 14.3 15.5* 15.3*  INR 1.1 1.2 1.2  HEPRLOWMOCWT  --   --  0.89  CREATININE  --  0.67  --      Estimated Creatinine Clearance: 79.2 mL/min (by C-G formula based on SCr of 0.67 mg/dL).   Medical History: Past Medical History:  Diagnosis Date   Allergic rhinitis    Anginal pain ([place])    Arthritis    "back" (05/07/2016)   Asthma    Atrial flutter ([place])    a. Remotely per notes.   Carpal tunnel syndrome, bilateral    Colon polyps    Diverticulitis    Dizziness    DVT (deep venous thrombosis) ([place]) 1980s x2 -    between birth of two sons. Saw hematology - was told she has a hypercoagulable disorder, should be on Coumadin lifelong.   Dysrhythmia    atrial fib   Family history of adverse reaction to anesthesia    "mother gets PONV"   Gallbladder disease    GERD (gastroesophageal reflux disease)    GIB (gastrointestinal bleeding) 04/14/2018   Halothane adverse reaction    narrow small opening   Heart murmur    Hiatal hernia    History of hiatal hernia    "small  one/CTA 05/01/2016" (05/07/2016)   History of kidney stones    HTN (hypertension)    Hypercholesteremia    hx; "brought it down w/diet" (05/07/2016)   Hyperglycemia    a. A1c 6.1 in 2014.   Inguinal hernia    Iron deficiency anemia due to chronic blood loss 04/14/2018   Iron malabsorption 04/14/2018   Left sided sciatica    Normal cardiac stress test 06/2012   Obesity    OSA (obstructive sleep apnea)    a. Mild, did not tolerate CPAP (05/07/2016)   PAF (paroxysmal atrial fibrillation) ([place])    a. s/p afib ablation at [facility] 2010 (Dr. Annabell Howells). b. Recurrent AF s/p DCCV 06/2010. c. On flecainide.    Paroxysmal atrial fibrillation ([place]) 09/09/2009   Qualifier: Diagnosis of  By: Selena Batten CMA, Jewel     PONV (postoperative nausea and  vomiting)    PPD positive, treated 1977   "treated for 1 yr after exposure to patient"   Pre-diabetes    RSV infection ~ 2006   Vertigo    Wandering (atrial) pacemaker    a. Remotely per notes.  (  pt. states has a wandering p wave ) no pacemaker    Assessment: 66 y/o F with PMH of atrial fibrillation, hx of DVT now s/p right total knee arthroplasty. Patient on warfarin PTA (6 mg every Tues/Sat, '4mg'$  all other days of the week)-last dose prior to surgery was on 12/31/21. Patient bridged with Enoxaparin '100mg'$  SQ q12h - last dose prior to surgery was 01/05/22 AM. Pharmacy consulted to resume warfarin post-operatively.  Anticoagulation Clinic note on 12/24/21 details specific plan for peri-operative warfarin dosing and enoxaparin bridging. Pharmacy discussed with ortho on 10/17 - confirmed to proceed with enoxaparin bridge starting POD#1.  Today, 01/09/22 INR = 1.2 remains subtherapeutic as expected after warfarin held pre-op LMWH Anti-Xa level = 0.89 is therapeutic Recommended to check level at steady state to ensure therapeutic for post-op bridge given BMI >30 Verbal order received from ortho on 10/19 ok to check level Anti-Xa level was obtained 4 hours after the 4th dose of enoxaparin CBC on 10/19: Hgb (11.2) slightly low post-op, Plt WNL SCr 0.67, CrCl ~75 mL/min TBW ~102 kg, BMI = 38  Goal of Therapy:  LMWH Anti-Xa level 0.6 - 1 units/mL obtained 4 hours after enoxaparin dose INR 2-3 Monitor platelets by anticoagulation protocol: Yes   Plan:  Continue home dose of warfarin per anticoagulation clinic note/plan. Warfarin 6 mg on Tues/Sat, 4 mg all other days of the week. Continue enoxaparin 100 mg (1 mg/kg) subQ q12h bridge until INR therapeutic.  Daily PT/INR CBC at least q72h while hospitalized.  Monitor for signs and symptoms of bleeding  Recommend to continue with anti-coag clinic plan at discharge for patient to continue home dose of warfarin + enoxaparin bridge with INR check  on Monday 10/23.  Lenis Noon, PharmD 01/09/22 9:30 AM

## 2022-01-09 NOTE — Progress Notes (Signed)
Physical Therapy Treatment Patient Details Name: [name] MRN: [id number] DOB: 03/25/1955 Today's Date: 01/09/2022   History of Present Illness 66 y/o admit for s/p RTKA 01/06/2022 PMH:HTN, GERD. arthritis, pacemaker    PT Comments    Pt continues cooperative but progressing slowly with mobility.  Pt up to ambulate limited distance to hall and negotiated single step twice with assist before returning to recliner for transport to room and assist back to bed.  Pt continues to require increased time for all tasks and expressing concern regarding spouse ability to provide enough assistance at her current level, states spouse is on 10 lb lifting restriction at this time.  RN aware.   Recommendations for follow up therapy are one component of a multi-disciplinary discharge planning process, led by the attending physician.  Recommendations may be updated based on patient status, additional functional criteria and insurance authorization.  Follow Up Recommendations  Follow physician's recommendations for discharge plan and follow up therapies     Assistance Recommended at Discharge Frequent or constant Supervision/Assistance  Patient can return home with the following A little help with walking and/or transfers;A little help with bathing/dressing/bathroom;Assistance with feeding;Assistance with cooking/housework;Assist for transportation;Help with stairs or ramp for entrance   Equipment Recommendations  None recommended by PT    Recommendations for Other Services       Precautions / Restrictions Precautions Precautions: Knee Precaution Booklet Issued: No Precaution Comments: educated with knee precautions avoiding pillow under knee at rest to enhance knee extension. Required Braces or Orthoses: Knee Immobilizer - Right Knee Immobilizer - Right: Discontinue once straight leg raise with < 10 degree lag Restrictions Weight Bearing Restrictions: No Other Position/Activity  Restrictions: WBAT     Mobility  Bed Mobility Overal bed mobility: Needs Assistance Bed Mobility: Supine to Sit     Supine to sit: Min guard     General bed mobility comments: increased time with pt self assisting R LE with gait belt; step pvt transfer bed to [facility]    Transfers Overall transfer level: Needs assistance Equipment used: Rolling walker (2 wheels) Transfers: Sit to/from Stand, Bed to chair/wheelchair/BSC Sit to Stand: Min guard   Step pivot transfers: Min guard       General transfer comment: min cues for LE management and use of UEs to self assist    Ambulation/Gait Ambulation/Gait assistance: Min guard Gait Distance (Feet): 20 Feet Assistive device: Rolling walker (2 wheels) Gait Pattern/deviations: Step-to pattern, Decreased step length - right, Decreased step length - left, Shuffle, Trunk flexed, Antalgic, Decreased stance time - right Gait velocity: decr     General Gait Details: min cues for sequence and RW position.   Stairs Stairs: Yes Stairs assistance: Min assist Stair Management: No rails, Step to pattern, Backwards, With walker Number of Stairs: 2 General stair comments: single step twice bkwd with cues for sequence and foot/RW placement   Wheelchair Mobility    Modified Rankin (Stroke Patients Only)       Balance Overall balance assessment: Needs assistance Sitting-balance support: Feet supported, No upper extremity supported Sitting balance-Leahy Scale: Good     Standing balance support: Single extremity supported Standing balance-Leahy Scale: Poor                              Cognition Arousal/Alertness: Awake/alert Behavior During Therapy: WFL for tasks assessed/performed Overall Cognitive Status: Within Functional Limits for tasks assessed  Exercises Total Joint Exercises Ankle Circles/Pumps: AROM, Both, 15 reps, Supine Quad Sets: AROM, Supine,  Right, 10 reps Heel Slides: AAROM, Supine, Right, 10 reps Straight Leg Raises: AAROM, Supine, Right, 10 reps Goniometric ROM: -5 - 40    General Comments        Pertinent Vitals/Pain Pain Assessment Pain Assessment: 0-10 Pain Score: 6  Pain Location: R knee Pain Descriptors / Indicators: Aching, Sore, Shooting, Grimacing, Guarding Pain Intervention(s): Limited activity within patient's tolerance, Monitored during session, Premedicated before session    Home Living                          Prior Function            PT Goals (current goals can now be found in the care plan section) Acute Rehab PT Goals Patient Stated Goal: I want to be able to be with my grandkids PT Goal Formulation: With patient Time For Goal Achievement: 01/20/22 Potential to Achieve Goals: Good Progress towards PT goals: Progressing toward goals    Frequency    7X/week      PT Plan Current plan remains appropriate    Co-evaluation              AM-PAC PT "6 Clicks" Mobility   Outcome Measure  Help needed turning from your back to your side while in a flat bed without using bedrails?: A Little Help needed moving from lying on your back to sitting on the side of a flat bed without using bedrails?: A Little Help needed moving to and from a bed to a chair (including a wheelchair)?: A Little Help needed standing up from a chair using your arms (e.g., wheelchair or bedside chair)?: A Little Help needed to walk in hospital room?: A Little Help needed climbing 3-5 steps with a railing? : A Lot 6 Click Score: 17    End of Session Equipment Utilized During Treatment: Gait belt Activity Tolerance: Patient limited by pain Patient left: in chair;with call bell/phone within reach Nurse Communication: Mobility status PT Visit Diagnosis: Other abnormalities of gait and mobility (R26.89)     Time: 0814-4818 PT Time Calculation (min) (ACUTE ONLY): 33 min  Charges:  $Gait Training:  8-22 mins $Therapeutic Activity: 8-22 mins                     Debe Coder PT Acute Rehabilitation Services Pager [phone] Office [phone]    Kennette Cuthrell 01/09/2022, 2:34 PM

## 2022-01-09 NOTE — Plan of Care (Signed)
  Problem: Activity: Goal: Ability to avoid complications of mobility impairment will improve Outcome: Progressing Goal: Range of joint motion will improve Outcome: Progressing   Problem: Pain Management: Goal: Pain level will decrease with appropriate interventions Outcome: Progressing   

## 2022-01-09 NOTE — Progress Notes (Signed)
Physical Therapy Treatment Patient Details Name: [name] MRN: [id number] DOB: 05/25/1955 Today's Date: 01/09/2022   History of Present Illness 66 y/o admit for s/p RTKA 01/06/2022 PMH:HTN, GERD. arthritis, pacemaker    PT Comments    Pt continues very cooperative and progressing with mobility but continues pain limited and fatigues easily.  This date, pt performed therex program with assist, up to [facility] for toileting and ambulated limited distance in hall.  Will follow up after next pain meds.   Recommendations for follow up therapy are one component of a multi-disciplinary discharge planning process, led by the attending physician.  Recommendations may be updated based on patient status, additional functional criteria and insurance authorization.  Follow Up Recommendations  Follow physician's recommendations for discharge plan and follow up therapies     Assistance Recommended at Discharge Frequent or constant Supervision/Assistance  Patient can return home with the following A little help with walking and/or transfers;A little help with bathing/dressing/bathroom;Assistance with feeding;Assistance with cooking/housework;Assist for transportation;Help with stairs or ramp for entrance   Equipment Recommendations  None recommended by PT    Recommendations for Other Services       Precautions / Restrictions Precautions Precautions: Knee Precaution Booklet Issued: No Precaution Comments: educated with knee precautions avoiding pillow under knee at rest to enhance knee extension. Required Braces or Orthoses: Knee Immobilizer - Right Knee Immobilizer - Right: Discontinue once straight leg raise with < 10 degree lag Restrictions Weight Bearing Restrictions: No Other Position/Activity Restrictions: WBAT     Mobility  Bed Mobility Overal bed mobility: Needs Assistance Bed Mobility: Supine to Sit     Supine to sit: Min guard     General bed mobility comments: increased  time with pt self assisting R LE with gait belt; step pvt transfer bed to [facility]    Transfers Overall transfer level: Needs assistance Equipment used: Rolling walker (2 wheels) Transfers: Sit to/from Stand, Bed to chair/wheelchair/BSC Sit to Stand: Min guard   Step pivot transfers: Min guard       General transfer comment: min cues for LE management and use of UEs to self assist    Ambulation/Gait Ambulation/Gait assistance: Min guard Gait Distance (Feet): 23 Feet Assistive device: Rolling walker (2 wheels) Gait Pattern/deviations: Step-to pattern, Decreased step length - right, Decreased step length - left, Shuffle, Trunk flexed, Antalgic, Decreased stance time - right Gait velocity: decr     General Gait Details: min cues for sequence and RW position. limited by pain/fatigue   Stairs             Wheelchair Mobility    Modified Rankin (Stroke Patients Only)       Balance Overall balance assessment: Needs assistance Sitting-balance support: Feet supported, No upper extremity supported Sitting balance-Leahy Scale: Good     Standing balance support: Single extremity supported                                Cognition Arousal/Alertness: Awake/alert Behavior During Therapy: WFL for tasks assessed/performed Overall Cognitive Status: Within Functional Limits for tasks assessed                                          Exercises Total Joint Exercises Ankle Circles/Pumps: AROM, Both, 15 reps, Supine Quad Sets: AROM, Supine, Right, 10 reps Heel Slides: AAROM, Supine, Right,  10 reps Straight Leg Raises: AAROM, Supine, Right, 10 reps Goniometric ROM: -5 - 40    General Comments        Pertinent Vitals/Pain Pain Assessment Pain Assessment: 0-10 Pain Score: 7  Pain Location: R knee Pain Descriptors / Indicators: Aching, Sore, Shooting, Grimacing, Guarding Pain Intervention(s): Limited activity within patient's tolerance,  Monitored during session, Premedicated before session    Home Living                          Prior Function            PT Goals (current goals can now be found in the care plan section) Acute Rehab PT Goals Patient Stated Goal: I want to be able to be with my grandkids PT Goal Formulation: With patient Time For Goal Achievement: 01/20/22 Potential to Achieve Goals: Good Progress towards PT goals: Progressing toward goals    Frequency    7X/week      PT Plan Current plan remains appropriate    Co-evaluation              AM-PAC PT "6 Clicks" Mobility   Outcome Measure  Help needed turning from your back to your side while in a flat bed without using bedrails?: A Little Help needed moving from lying on your back to sitting on the side of a flat bed without using bedrails?: A Little Help needed moving to and from a bed to a chair (including a wheelchair)?: A Little Help needed standing up from a chair using your arms (e.g., wheelchair or bedside chair)?: A Little Help needed to walk in hospital room?: A Little Help needed climbing 3-5 steps with a railing? : Total 6 Click Score: 16    End of Session Equipment Utilized During Treatment: Gait belt Activity Tolerance: Patient limited by pain Patient left: in chair;with call bell/phone within reach Nurse Communication: Mobility status PT Visit Diagnosis: Other abnormalities of gait and mobility (R26.89)     Time: 7622-6333 PT Time Calculation (min) (ACUTE ONLY): 51 min  Charges:  $Gait Training: 8-22 mins $Therapeutic Exercise: 8-22 mins $Therapeutic Activity: 8-22 mins                     Debe Coder PT Acute Rehabilitation Services Pager [phone] Office [phone]    Kathelyn Gombos 01/09/2022, 10:50 AM

## 2022-01-10 LAB — PROTIME-INR
INR: 1.3 — ABNORMAL HIGH (ref 0.8–1.2)
Prothrombin Time: 15.7 seconds — ABNORMAL HIGH (ref 11.4–15.2)

## 2022-01-10 NOTE — Plan of Care (Signed)
  Problem: Pain Management: Goal: Pain level will decrease with appropriate interventions Outcome: Progressing   

## 2022-01-10 NOTE — Progress Notes (Signed)
Physical Therapy Treatment Patient Details Name: [name] MRN: [id number] DOB: [birth year] Today's Date: 01/10/2022   History of Present Illness 66 y/o admit for s/p RTKA 01/06/2022 PMH:HTN, GERD. arthritis, pacemaker    PT Comments    Pt continues very cooperative and with marked improvement in activity tolerance, stability with gait, and with decreased level of assist for all tasks.  Pt up to ambulate increased distance in hall, negotiated stair and performed therex program with assist.  Pt feeling much more confident with dc home this date.   Recommendations for follow up therapy are one component of a multi-disciplinary discharge planning process, led by the attending physician.  Recommendations may be updated based on patient status, additional functional criteria and insurance authorization.  Follow Up Recommendations  Follow physician's recommendations for discharge plan and follow up therapies     Assistance Recommended at Discharge Frequent or constant Supervision/Assistance  Patient can return home with the following A little help with walking and/or transfers;A little help with bathing/dressing/bathroom;Assistance with feeding;Assistance with cooking/housework;Assist for transportation;Help with stairs or ramp for entrance   Equipment Recommendations  None recommended by PT    Recommendations for Other Services       Precautions / Restrictions Precautions Precautions: Knee Precaution Booklet Issued: No Precaution Comments: educated with knee precautions avoiding pillow under knee at rest to enhance knee extension. Required Braces or Orthoses: Knee Immobilizer - Right Knee Immobilizer - Right: Discontinue once straight leg raise with < 10 degree lag Restrictions Weight Bearing Restrictions: No Other Position/Activity Restrictions: WBAT     Mobility  Bed Mobility Overal bed mobility: Needs Assistance Bed Mobility: Supine to Sit     Supine to sit:  Supervision     General bed mobility comments: increased time with pt self assisting R LE with UEs    Transfers Overall transfer level: Needs assistance Equipment used: Rolling walker (2 wheels) Transfers: Sit to/from Stand Sit to Stand: Min guard, Supervision           General transfer comment: min cues for LE management and use of UEs to self assist    Ambulation/Gait Ambulation/Gait assistance: Min guard, Supervision Gait Distance (Feet): 62 Feet Assistive device: Rolling walker (2 wheels) Gait Pattern/deviations: Step-to pattern, Decreased step length - right, Decreased step length - left, Shuffle, Trunk flexed, Antalgic, Decreased stance time - right Gait velocity: decr     General Gait Details: min cues for sequence and RW position.   Stairs Stairs: Yes Stairs assistance: Min assist Stair Management: No rails, Step to pattern, Backwards, With walker Number of Stairs: 1 General stair comments: single step bkwd with cues for sequence and foot/RW placement   Wheelchair Mobility    Modified Rankin (Stroke Patients Only)       Balance Overall balance assessment: Needs assistance Sitting-balance support: Feet supported, No upper extremity supported Sitting balance-Leahy Scale: Good     Standing balance support: Single extremity supported Standing balance-Leahy Scale: Poor                              Cognition Arousal/Alertness: Awake/alert Behavior During Therapy: WFL for tasks assessed/performed Overall Cognitive Status: Within Functional Limits for tasks assessed                                          Exercises Total Joint Exercises Ankle  Circles/Pumps: AROM, Both, 15 reps, Supine Quad Sets: AROM, Supine, Right, 10 reps Heel Slides: AAROM, Supine, Right, 15 reps Straight Leg Raises: AAROM, Supine, Right, 15 reps Goniometric ROM: -5 - 40 AAROM R knee    General Comments        Pertinent Vitals/Pain Pain  Assessment Pain Assessment: 0-10 Pain Score: 5  Pain Location: R knee Pain Descriptors / Indicators: Aching, Sore, Shooting, Grimacing, Guarding Pain Intervention(s): Limited activity within patient's tolerance, Monitored during session, Premedicated before session, Ice applied    Home Living                          Prior Function            PT Goals (current goals can now be found in the care plan section) Acute Rehab PT Goals Patient Stated Goal: I want to be able to be with my grandkids PT Goal Formulation: With patient Time For Goal Achievement: 01/20/22 Potential to Achieve Goals: Good Progress towards PT goals: Progressing toward goals    Frequency    7X/week      PT Plan Current plan remains appropriate    Co-evaluation              AM-PAC PT "6 Clicks" Mobility   Outcome Measure  Help needed turning from your back to your side while in a flat bed without using bedrails?: A Little Help needed moving from lying on your back to sitting on the side of a flat bed without using bedrails?: A Little Help needed moving to and from a bed to a chair (including a wheelchair)?: A Little Help needed standing up from a chair using your arms (e.g., wheelchair or bedside chair)?: A Little Help needed to walk in hospital room?: A Little Help needed climbing 3-5 steps with a railing? : A Little 6 Click Score: 18    End of Session Equipment Utilized During Treatment: Gait belt Activity Tolerance: Patient tolerated treatment well Patient left: in chair;with call bell/phone within reach;with family/visitor present Nurse Communication: Mobility status PT Visit Diagnosis: Other abnormalities of gait and mobility (R26.89)     Time: 8115-7262 PT Time Calculation (min) (ACUTE ONLY): 33 min  Charges:  $Gait Training: 8-22 mins $Therapeutic Exercise: 8-22 mins                     Debe Coder PT Acute Rehabilitation Services Pager [phone] Office  [phone]    Tamarion Haymond 01/10/2022, 1:03 PM

## 2022-01-10 NOTE — Progress Notes (Signed)
PATIENT ID: [name]  MRN: [id number]  DOB/AGE:  04-20-1955 / 66 y.o.  4 Days Post-Op Procedure(s) (LRB): RIGHT TOTAL KNEE ARTHROPLASTY (Right)    PROGRESS NOTE Subjective: Patient is alert, oriented, no Nausea, no Vomiting, yes passing gas. Taking PO well. Denies SOB, Chest or Calf Pain. Using Incentive Spirometer, PAS in place. Ambulate WBAT with pt walking 20 ft, Patient reports pain as 6/10 .    Objective: Vital signs in last 24 hours: Vitals:   01/09/22 1409 01/09/22 1945 01/09/22 2126 01/10/22 0616  BP: (!) 158/63  (!) 133/54 (!) 156/55  Pulse: 84  87 87  Resp: '16  17 17  '$ Temp: 99.1 F (37.3 C)  99.4 F (37.4 C) 98.6 F (37 C)  TempSrc: Oral  Oral Oral  SpO2: 90% 95% 97% 94%  Weight:      Height:          Intake/Output from previous day: I/O last 3 completed shifts: In: 940 [P.O.:940] Out: 1800 [Urine:1800]   Intake/Output this shift: No intake/output data recorded.   LABORATORY DATA: Recent Labs    01/08/22 0337 01/09/22 0350 01/10/22 0357  WBC 13.0*  --   --   HGB 11.2*  --   --   HCT 34.4*  --   --   PLT 342  --   --   CREATININE 0.67  --   --   INR 1.2 1.2 1.3*    Examination: Neurologically intact Neurovascular intact Sensation intact distally Intact pulses distally Dorsiflexion/Plantar flexion intact Incision: dressing C/D/I and no drainage No cellulitis present Compartment soft}  Assessment:   4 Days Post-Op Procedure(s) (LRB): RIGHT TOTAL KNEE ARTHROPLASTY (Right) ADDITIONAL DIAGNOSIS: Expected Acute Blood Loss Anemia,  Anticipated LOS equal to or greater than 2 midnights due to - Age 67 and older with one or more of the following:  - Obesity  - Expected need for hospital services (PT, OT, Nursing) required for safe  discharge  - Anticipated need for postoperative skilled nursing care or inpatient rehab  OR   - Unanticipated findings during/Post Surgery: Slow post-op progression: GI, pain control, mobility    Plan: PT/OT  WBAT, AROM and PROM  DVT Prophylaxis:  [id number], coumadin DISCHARGE PLAN: Home when pt meets therapy goals DISCHARGE NEEDS: HHPT, Walker, and 3-in-1 comode seat     Joanell Rising 01/10/2022, 8:26 AM

## 2022-01-10 NOTE — Progress Notes (Signed)
ANTICOAGULATION CONSULT NOTE - Follow Up  Pharmacy Consult for Warfarin Indication: atrial fibrillation, hx of DVT  Allergies  Allergen Reactions   Aspirin Other (See Comments)    Makes asthma worse and makes stomach hurt   Banana Swelling and Cough    Inside of the mouth swells   Food Other (See Comments) and Cough    Nuts- mucus membranes inflamed/cough    Iodinated Contrast Media Anaphylaxis    Required epinephrine. Since then, has tolerated with premed. Other reaction(s): wheeze...requires premdication with prednisone 3 days in advance   Latex Hives and Cough    Wheezing/cough  Other reaction(s): hives/wheeze   Meperidine Hcl Other (See Comments)    Projectile vomiting    Peanut-Containing Drug Products Swelling and Cough    Inside of the mouth swells   Penicillins Anaphylaxis    Has patient had a PCN reaction causing immediate rash, facial/tongue/throat swelling, SOB or lightheadedness with hypotension:Yes Has patient had a PCN reaction causing severe rash involving mucus membranes or skin necrosis:Yes Has patient had a PCN reaction that required hospitalization:Treated in ER w/epi & breathing treatments Has patient had a PCN reaction occurring within the last 10 years:No If all of the above answers are "NO", then may proceed with Cephalosporin use.    Statins Other (See Comments)    Severe leg pain - has tried most of them   Sulfonamide Derivatives Anaphylaxis   Tape Hives and Dermatitis    No plastic tape!!   Alfuzosin Other (See Comments)    Headache, dizziness   Halothane     Listed under anesthesia adverse reactions pt does not know what reaction or if any at preop on 05/26/21.    Other Other (See Comments)    Vascepa- upset stomach   Sulfamethoxazole Hives    Patient Measurements: Height: '5\' 4"'$  (162.6 cm) Weight: 101.6 kg (224 lb) IBW/kg (Calculated) : 54.7  Vital Signs: Temp: 98.6 F (37 C) (10/21 0616) Temp Source: Oral (10/21 0616) BP: 156/55 (10/21  0616) Pulse Rate: 87 (10/21 0616)  Labs: Recent Labs    01/08/22 0337 01/09/22 0350 01/10/22 0357  HGB 11.2*  --   --   HCT 34.4*  --   --   PLT 342  --   --   LABPROT 15.5* 15.3* 15.7*  INR 1.2 1.2 1.3*  HEPRLOWMOCWT  --  0.89  --   CREATININE 0.67  --   --      Estimated Creatinine Clearance: 79.2 mL/min (by C-G formula based on SCr of 0.67 mg/dL).   Medical History: Past Medical History:  Diagnosis Date   Allergic rhinitis    Anginal pain ([place])    Arthritis    "back" (05/07/2016)   Asthma    Atrial flutter ([place])    a. Remotely per notes.   Carpal tunnel syndrome, bilateral    Colon polyps    Diverticulitis    Dizziness    DVT (deep venous thrombosis) ([place]) 1980s x2 -    between birth of two sons. Saw hematology - was told she has a hypercoagulable disorder, should be on Coumadin lifelong.   Dysrhythmia    atrial fib   Family history of adverse reaction to anesthesia    "mother gets PONV"   Gallbladder disease    GERD (gastroesophageal reflux disease)    GIB (gastrointestinal bleeding) 04/14/2018   Halothane adverse reaction    narrow small opening   Heart murmur    Hiatal hernia    History of  hiatal hernia    "small one/CTA 05/01/2016" (05/07/2016)   History of kidney stones    HTN (hypertension)    Hypercholesteremia    hx; "brought it down w/diet" (05/07/2016)   Hyperglycemia    a. A1c 6.1 in 2014.   Inguinal hernia    Iron deficiency anemia due to chronic blood loss 04/14/2018   Iron malabsorption 04/14/2018   Left sided sciatica    Normal cardiac stress test 06/2012   Obesity    OSA (obstructive sleep apnea)    a. Mild, did not tolerate CPAP (05/07/2016)   PAF (paroxysmal atrial fibrillation) ([place])    a. s/p afib ablation at [facility] 2010 (Dr. Annabell Howells). b. Recurrent AF s/p DCCV 06/2010. c. On flecainide.    Paroxysmal atrial fibrillation ([place]) 09/09/2009   Qualifier: Diagnosis of  By: Selena Batten CMA, [place]     PONV (postoperative nausea and  vomiting)    PPD positive, treated 1977   "treated for 1 yr after exposure to patient"   Pre-diabetes    RSV infection ~ 2006   Vertigo    Wandering (atrial) pacemaker    a. Remotely per notes.  (  pt. states has a wandering p wave ) no pacemaker    Assessment: 66 y/o F with PMH of atrial fibrillation, hx of DVT now s/p right total knee arthroplasty. Patient on warfarin PTA (6 mg every Tues/Sat, '4mg'$  all other days of the week)-last dose prior to surgery was on 12/31/21. Patient bridged with Enoxaparin '100mg'$  SQ q12h - last dose prior to surgery was 01/05/22 AM. Pharmacy consulted to resume warfarin post-operatively.  Anticoagulation Clinic note on 12/24/21 details specific plan for peri-operative warfarin dosing and enoxaparin bridging. Pharmacy discussed with ortho on 10/17 - confirmed to proceed with enoxaparin bridge starting POD#1.  - LMWH Anti-Xa level on 10/20 = 0.89 is therapeutic  Today, 01/10/22 - INR is subtherapeutic at 1.3 - no bleeding documented  Goal of Therapy:  LMWH Anti-Xa level 0.6 - 1 units/mL obtained 4 hours after enoxaparin dose INR 2-3 Monitor platelets by anticoagulation protocol: Yes   Plan:  Continue home dose of warfarin per anticoagulation clinic note/plan. Warfarin 6 mg on Tues/Sat, 4 mg all other days of the week. Continue enoxaparin 100 mg (1 mg/kg) subQ q12h bridge until INR therapeutic.  Daily PT/INR CBC at least q72h while hospitalized.  Monitor for signs and symptoms of bleeding  Recommend to continue with anti-coag clinic plan at discharge for patient to continue home dose of warfarin + enoxaparin bridge with INR check on Monday 10/23.  Lynelle Doctor, PharmD 01/10/22 11:59 AM

## 2022-01-10 NOTE — Progress Notes (Signed)
Discharge package printed and instructions given to patient. Patient verbalizes understanding. 

## 2022-01-10 NOTE — Plan of Care (Signed)
  Problem: Pain Management: Goal: Pain level will decrease with appropriate interventions 01/10/2022 0737 by Mayme Genta, RN Outcome: Progressing 01/10/2022 0725 by Mayme Genta, RN Outcome: Progressing

## 2022-01-12 ENCOUNTER — Ambulatory Visit: Payer: Medicare Other

## 2022-01-16 ENCOUNTER — Ambulatory Visit (HOSPITAL_BASED_OUTPATIENT_CLINIC_OR_DEPARTMENT_OTHER): Payer: Medicare Other | Admitting: Cardiology

## 2022-01-16 ENCOUNTER — Ambulatory Visit: Payer: Medicare Other | Attending: Internal Medicine | Admitting: *Deleted

## 2022-01-16 DIAGNOSIS — I480 Paroxysmal atrial fibrillation: Secondary | ICD-10-CM | POA: Diagnosis not present

## 2022-01-16 DIAGNOSIS — I4891 Unspecified atrial fibrillation: Secondary | ICD-10-CM

## 2022-01-16 DIAGNOSIS — Z5181 Encounter for therapeutic drug level monitoring: Secondary | ICD-10-CM

## 2022-01-16 LAB — POCT INR: POC INR: 1.4

## 2022-01-16 NOTE — Patient Instructions (Signed)
Description   -Continue lovenox injections -Take 4 tablets of warfarin today  -Then continue to take warfarin 2 tablets daily except  for 3 tablets on Tuesday and Saturdays. Recheck INR on Monday (10/30). Call Coumadin Clinic [phone] Main # [phone] with any questions

## 2022-01-19 ENCOUNTER — Ambulatory Visit (INDEPENDENT_AMBULATORY_CARE_PROVIDER_SITE_OTHER): Payer: Medicare Other | Admitting: Cardiology

## 2022-01-19 ENCOUNTER — Encounter (HOSPITAL_BASED_OUTPATIENT_CLINIC_OR_DEPARTMENT_OTHER): Payer: Self-pay | Admitting: Cardiology

## 2022-01-19 VITALS — BP 134/70 | HR 97 | Ht 64.0 in | Wt 225.0 lb

## 2022-01-19 DIAGNOSIS — D6869 Other thrombophilia: Secondary | ICD-10-CM | POA: Diagnosis not present

## 2022-01-19 DIAGNOSIS — I480 Paroxysmal atrial fibrillation: Secondary | ICD-10-CM | POA: Diagnosis not present

## 2022-01-19 DIAGNOSIS — E782 Mixed hyperlipidemia: Secondary | ICD-10-CM | POA: Diagnosis not present

## 2022-01-19 DIAGNOSIS — I10 Essential (primary) hypertension: Secondary | ICD-10-CM | POA: Diagnosis not present

## 2022-01-19 DIAGNOSIS — I200 Unstable angina: Secondary | ICD-10-CM

## 2022-01-19 NOTE — Progress Notes (Signed)
Cardiology Office Note:    Date:  01/19/2022   ID:  [name], DOB [birth year], MRN [id number]  PCP:  Antony Contras, MD  Cardiologist:  Buford Dresser, MD  Referring MD: Antony Contras, MD   CC: follow up  History of Present Illness:    [name] is a 66 y.o. female with a hx of paroxysmal atrial fibrillation, hypertension, heart murmur, DVT, and hyperlipidemia who is seen for followup today. I first met her in the hospital 03/30/21.  She was seen 09/09/2021 by Lars Mage, MD. She reported she would sometimes feel palpitations pretty regularly. She attributed this increased frequency to the recent death of her father. She had no recurrent chest pains at that time.  Today:  She says she has been doing okay. She has experienced some palpitations over the past week, which feel "like pounding." She has checked with her [employer] mobile, and this has shown sinus rhythm in the 80s. She will also sometimes feel some pain in her chest at these times, but it is fairly mild and does not require her to stop her activities.  She has experienced some dizziness while standing at times, but when she sits down it will resolve.   Since her knee arthroplasty, she mentions that she has been reducing the amount of oxycodone that she takes per day, as her pain improves. She has been getting closer and closer to independently caring for herself again.  She denies any shortness of breath or peripheral edema. No headaches, syncope, orthopnea, or PND.  Past Medical History:  Diagnosis Date   Allergic rhinitis    Anginal pain ([place])    Arthritis    "back" (05/07/2016)   Asthma    Atrial flutter ([place])    a. Remotely per notes.   Carpal tunnel syndrome, bilateral    Colon polyps    Diverticulitis    Dizziness    DVT (deep venous thrombosis) ([place]) 1980s x2 -    between birth of two sons. Saw hematology - was told she has a hypercoagulable disorder, should be on Coumadin lifelong.    Dysrhythmia    atrial fib   Family history of adverse reaction to anesthesia    "mother gets PONV"   Gallbladder disease    GERD (gastroesophageal reflux disease)    GIB (gastrointestinal bleeding) 04/14/2018   Halothane adverse reaction    narrow small opening   Heart murmur    Hiatal hernia    History of hiatal hernia    "small one/CTA 05/01/2016" (05/07/2016)   History of kidney stones    HTN (hypertension)    Hypercholesteremia    hx; "brought it down w/diet" (05/07/2016)   Hyperglycemia    a. A1c 6.1 in 2014.   Inguinal hernia    Iron deficiency anemia due to chronic blood loss 04/14/2018   Iron malabsorption 04/14/2018   Left sided sciatica    Normal cardiac stress test 06/2012   Obesity    OSA (obstructive sleep apnea)    a. Mild, did not tolerate CPAP (05/07/2016)   PAF (paroxysmal atrial fibrillation) ([place])    a. s/p afib ablation at [facility] 2010 (Dr. Annabell Howells). b. Recurrent AF s/p DCCV 06/2010. c. On flecainide.    Paroxysmal atrial fibrillation ([place]) 09/09/2009   Qualifier: Diagnosis of  By: Selena Batten CMA, [place]     PONV (postoperative nausea and vomiting)    PPD positive, treated 1977   "treated for 1 yr after exposure to patient"   Pre-diabetes  RSV infection ~ 2006   Vertigo    Wandering (atrial) pacemaker    a. Remotely per notes.  (  pt. states has a wandering p wave ) no pacemaker    Past Surgical History:  Procedure Laterality Date   ANKLE ARTHODESIS W/ ARTHROSCOPY Right 11/12/2020   ATRIAL ABLATION SURGERY  05/07/2016   "fib and flutter"   ATRIAL FIBRILLATION ABLATION  2010   at [facility] N/A 05/07/2016   Procedure: Atrial Fibrillation Ablation;  Surgeon: Thompson Grayer, MD;  Location: [facility];  Service: Cardiovascular;  Laterality: N/A;   BREAST CYST ASPIRATION Bilateral    CARDIAC CATHETERIZATION  2008   CARPAL TUNNEL RELEASE Bilateral    CATARACT EXTRACTION Bilateral 2022   CESAREAN SECTION  1986;  1988   COLON RESECTION     COLONOSCOPY  X 2   COLONOSCOPY W/ BIOPSIES AND POLYPECTOMY  X 2   CYSTOSCOPY/URETEROSCOPY/HOLMIUM LASER/STENT PLACEMENT Left 12/23/2019   Procedure: CYSTOSCOPY/URETEROSCOPY/HOLMIUM LASER/STENT PLACEMENT;  Surgeon: Robley Fries, MD;  Location: [facility];  Service: Urology;  Laterality: Left;   ESOPHAGOGASTRODUODENOSCOPY     ESOPHAGOGASTRODUODENOSCOPY (EGD) WITH PROPOFOL N/A 07/19/2017   Procedure: ESOPHAGOGASTRODUODENOSCOPY (EGD) WITH PROPOFOL;  Surgeon: Yetta Flock, MD;  Location: [facility];  Service: Gastroenterology;  Laterality: N/A;   INGUINAL HERNIA REPAIR Right    KNEE ARTHROSCOPY Right 06/10/2021   Procedure: RIGHT KNEE ARTHROSCOPY;  Surgeon: Melrose Nakayama, MD;  Location: [facility];  Service: Orthopedics;  Laterality: Right;   [place]   POLYPECTOMY N/A 07/19/2017   Procedure: POLYPECTOMY;  Surgeon: Yetta Flock, MD;  Location[facility];  Service: Gastroenterology;  Laterality: N/A;   PROCTOSCOPY N/A 08/06/2017   Procedure: PROCTOSCOPY;  Surgeon: Michael Boston, MD;  Location: [facility];  Service: General;  Laterality: N/A;   TMJ ARTHROPLASTY     TOTAL KNEE ARTHROPLASTY Right 01/06/2022   Procedure: RIGHT TOTAL KNEE ARTHROPLASTY;  Surgeon: Melrose Nakayama, MD;  Location: [facility];  Service: Orthopedics;  Laterality: Right;   TUBAL LIGATION  1988    Current Medications: Current Outpatient Medications on File Prior to Visit  Medication Sig   acetaminophen (TYLENOL) 650 MG CR tablet Take 1,300 mg by mouth every 8 (eight) hours as needed for pain.    APPLE CIDER VINEGAR PO Take 450 mg by mouth in the morning.   cetirizine (ZYRTEC) 10 MG tablet Take 10 mg by mouth in the morning.   Cholecalciferol (VITAMIN D3 PO) Take 2,000 Units by mouth in the morning.   dicyclomine (BENTYL) 10 MG capsule TAKE 1 CAPSULE BY MOUTH 30 MINS BEFORE MEALS AND AT BEDTIME AS NEEDED FOR PAIN/CRAMPING   enoxaparin (LOVENOX) 100 MG/ML  injection Inject 1 mL (100 mg total) into the skin every 12 (twelve) hours. As instructed by Anticoagulation Clinic   ezetimibe (ZETIA) 10 MG tablet TAKE 1 TABLET BY MOUTH EVERY DAY   FLOVENT HFA 220 MCG/ACT inhaler Inhale 1 puff into the lungs 2 (two) times daily.   fluticasone (FLONASE) 50 MCG/ACT nasal spray Place 1 spray into both nostrils 2 (two) times daily.    isosorbide mononitrate (IMDUR) 30 MG 24 hr tablet TAKE 1 TABLET BY MOUTH EVERY DAY (Patient taking differently: Take 30 mg by mouth every evening.)   losartan (COZAAR) 25 MG tablet Take 1 tablet (25 mg total) by mouth daily.   meclizine (ANTIVERT) 25 MG tablet Take 1 tablet (25 mg total) by mouth 3 (three) times daily as needed  for dizziness.   Multiple Vitamin (MULTIVITAMIN WITH MINERALS) TABS tablet Take 1 tablet by mouth in the morning.   nitroGLYCERIN (NITROSTAT) 0.4 MG SL tablet PLACE 1 TABLET UNDER THE TONGUE EVERY 5 MINUTES AS NEEDED.   omeprazole (PRILOSEC) 40 MG capsule Take 1 capsule (40 mg total) by mouth 2 (two) times daily. Decrease to one daily after 6 weeks if symptoms are maintained.   oxyCODONE-acetaminophen (PERCOCET) 5-325 MG tablet Take 1-2 tablets by mouth every 6 (six) hours as needed for severe pain (post op pain).   polyethylene glycol (MIRALAX) 17 g packet Take 17 g by mouth 2 (two) times daily. Titrate as needed (Patient taking differently: Take 17 g by mouth 2 (two) times daily as needed (constipation.).)   PROAIR HFA 108 (90 BASE) MCG/ACT inhaler Inhale 2 puffs into the lungs every 6 (six) hours as needed for wheezing or shortness of breath.    sucralfate (CARAFATE) 1 g tablet Take 1 tablet before meals up to 3 times daily. (Patient taking differently: Take 1 g by mouth 3 (three) times daily as needed (stomach issues).)   traMADol (ULTRAM) 50 MG tablet Take 1 tablet (50 mg total) by mouth 3 (three) times daily as needed for moderate pain (post op pain).   venlafaxine XR (EFFEXOR-XR) 37.5 MG 24 hr capsule Take  37.5 mg by mouth daily with breakfast.    verapamil (CALAN) 80 MG tablet Take 160 mg by mouth 2 (two) times daily.   warfarin (COUMADIN) 2 MG tablet TAKE 4MG DAILY EXCEPT 6MG ON TUESDAYS, THURSDAYS,AND SATURDAYS AS DIRECTED BY ANTICOAGULATION CLINIC (Patient taking differently: Take 4-6 mg by mouth See admin instructions. Take 3 tablets (6 mg) by mouth on Tuesdays & Saturdays in the evening.  Take 2 tablets (3 mg) by mouth on Sundays, Mondays, Wednesdays, Thursdays & Fridays in the evening.)   No current facility-administered medications on file prior to visit.     Allergies:   Aspirin, Banana, Food, Iodinated contrast media, Latex, Meperidine hcl, Peanut-containing drug products, Penicillins, Statins, Sulfonamide derivatives, Tape, Alfuzosin, Halothane, Other, and Sulfamethoxazole   Social History   Tobacco Use   Smoking status: Never    Passive exposure: Past   Smokeless tobacco: Never  Vaping Use   Vaping Use: Never used  Substance Use Topics   Alcohol use: Not Currently    Comment: rare   Drug use: No    Family History: family history includes Colon polyps in her father; Diabetes in her maternal grandmother; Heart attack in her maternal grandmother, mother, and paternal grandfather; Hyperlipidemia in her father and maternal grandmother; Hypertension in her maternal grandfather, maternal grandmother, and mother; Lupus in her sister; Stroke in her maternal grandfather, mother, and paternal grandmother; Thyroid disease in her mother and sister. There is no history of Colon cancer or Stomach cancer.  ROS:   Please see the history of present illness.  (+) Palpitations  (+) Occasional chest pain (+) Occasional dizziness  All other systems are reviewed and negative.  EKGs/Labs/Other Studies Reviewed:    The following studies were reviewed today:  Coronary CT 04/09/2021: IMPRESSION: 1. Coronary calcium score of 68. This was 86 percentile for age and sex matched control.   2.  Normal coronary origin with right dominance.   3. CAD-RADS 2. Mild non-obstructive CAD (25-49%). Consider non-atherosclerotic causes of chest pain. Consider preventive therapy and risk factor modification.   Echo 03/14/2021: 1. Left ventricular ejection fraction, by estimation, is 60 to 65%. The  left ventricle has normal function.  The left ventricle has no regional  wall motion abnormalities. Left ventricular diastolic parameters are  consistent with Grade II diastolic  dysfunction (pseudonormalization). Elevated left ventricular end-diastolic  pressure.   2. Right ventricular systolic function is normal. The right ventricular  size is normal.   3. Left atrial size was mild to moderately dilated.   4. The mitral valve is grossly normal. Mild mitral valve regurgitation.  No evidence of mitral stenosis.   5. The aortic valve is grossly normal. Aortic valve regurgitation is not  visualized. No aortic stenosis is present.   6. The inferior vena cava is normal in size with <50% respiratory  variability, suggesting right atrial pressure of 8 mmHg.   Comparison(s): No significant change from prior study.   Conclusion(s)/Recommendation(s): Otherwise normal echocardiogram, with minor abnormalities described in the report.    EKG:  EKG is personally reviewed.   01/19/22: not ordered today  Recent Labs: 01/27/2021: TSH 1.170 03/29/2021: ALT 31; B Natriuretic Peptide 26.1 03/30/2021: Magnesium 2.1 12/24/2021: BUN 12; Potassium 3.9; Sodium 135 01/08/2022: Creatinine, Ser 0.67; Hemoglobin 11.2; Platelets 342  Recent Lipid Panel    Component Value Date/Time   CHOL 238 (H) 01/13/2013 0139   TRIG 247 (H) 01/13/2013 0139   HDL 48 01/13/2013 0139   CHOLHDL 5.0 01/13/2013 0139   VLDL 49 (H) 01/13/2013 0139   LDLCALC 141 (H) 01/13/2013 0139    Physical Exam:    VS:  BP 134/70   Pulse 97   Ht _0  (1.626 m)   Wt 225 lb (102.1 kg)   BMI 38.62 kg/m     Wt Readings from Last 3 Encounters:   01/19/22 225 lb (102.1 kg)  01/06/22 224 lb (101.6 kg)  12/24/21 224 lb (101.6 kg)    GEN: Well nourished, well developed in no acute distress, in wheelchair HEENT: Normal, moist mucous membranes NECK: No JVD CARDIAC: regular rhythm, normal S1 and S2, no rubs or gallops. No murmur. VASCULAR: Radial and DP pulses 2+ bilaterally. No carotid bruits RESPIRATORY:  Clear to auscultation without rales, wheezing or rhonchi  ABDOMEN: Soft, non-tender, non-distended MUSCULOSKELETAL:  Ambulates independently SKIN: Warm and dry, RLE knee incision c/d/I. Minimal RLE edema NEUROLOGIC:  Alert and oriented x 3. No focal neuro deficits noted. PSYCHIATRIC:  Normal affect    ASSESSMENT:    1. Paroxysmal atrial fibrillation (HCC)   2. Essential hypertension   3. Secondary hypercoagulable state ([place])   4. Mixed hyperlipidemia     PLAN:    Atrial fibrillation s/p prior ablation -has had palpitations but these have been sinus rhythm when she has checked -remains on coumadin, bridging with lovenox until INR therapeutic post op -CHA2DS2/VAS Stroke Risk Points=6    Hypertension -near goal today -continue current medications  Hyperlipidemia Statin intolerance -on ezetimibe  Atypical chest pain  -CT reassuring that this is not cardiac in etiology -does improve with nitro  Cardiac risk counseling and prevention recommendations: -recommend heart healthy/Mediterranean diet, with whole grains, fruits, vegetable, fish, lean meats, nuts, and olive oil. Limit salt. -recommend moderate walking, 3-5 times/week for 30-50 minutes each session. Aim for at least 150 minutes.week. Goal should be pace of 3 miles/hours, or walking 1.5 miles in 30 minutes -recommend avoidance of tobacco products. Avoid excess alcohol. -ASCVD risk score: The ASCVD Risk score (Arnett DK, et al., 2019) failed to calculate for the following reasons:   Cannot find a previous HDL lab   Cannot find a previous total cholesterol lab  Plan for follow up: 6 months.  Buford Dresser, MD, PhD, [facility]    Medication Adjustments/Labs and Tests Ordered: Current medicines are reviewed at length with the patient today.  Concerns regarding medicines are outlined above.  No orders of the defined types were placed in this encounter.  No orders of the defined types were placed in this encounter.   Patient Instructions  Medication Instructions:  None *If you need a refill on your cardiac medications before your next appointment, please call your pharmacy*   Lab Work: None If you have labs (blood work) drawn today and your tests are completely normal, you will receive your results only by: [place] (if you have MyChart) OR A paper copy in the mail If you have any lab test that is abnormal or we need to change your treatment, we will call you to review the results.   Testing/Procedures: None   Follow-Up: At [facility], you and your health needs are our priority.  As part of our continuing mission to provide you with exceptional heart care, we have created designated Provider Care Teams.  These Care Teams include your primary Cardiologist (physician) and Advanced Practice Providers (APPs -  Physician Assistants and Nurse Practitioners) who all work together to provide you with the care you need, when you need it.  We recommend signing up for the patient portal called "MyChart".  Sign up information is provided on this After Visit Summary.  MyChart is used to connect with patients for Virtual Visits (Telemedicine).  Patients are able to view lab/test results, encounter notes, upcoming appointments, etc.  Non-urgent messages can be sent to your provider as well.   To learn more about what you can do with MyChart, go to [email or web address].    Your next appointment:   6 month(s)  The format for your next appointment:   In Person  Provider:   Buford Dresser,  MD    Other Instructions None        I,Breanna Adamick,acting as a scribe for Buford Dresser, MD.,have documented all relevant documentation on the behalf of Buford Dresser, MD,as directed by  Buford Dresser, MD while in the presence of Buford Dresser, MD.  I, Buford Dresser, MD, have reviewed all documentation for this visit. The documentation on 01/19/22 for the exam, diagnosis, procedures, and orders are all accurate and complete.   Signed, Buford Dresser, MD PhD 01/19/2022 12:06 PM    [place]

## 2022-01-19 NOTE — Patient Instructions (Signed)
Medication Instructions:  None *If you need a refill on your cardiac medications before your next appointment, please call your pharmacy*   Lab Work: None If you have labs (blood work) drawn today and your tests are completely normal, you will receive your results only by: [place] (if you have MyChart) OR A paper copy in the mail If you have any lab test that is abnormal or we need to change your treatment, we will call you to review the results.   Testing/Procedures: None   Follow-Up: At [facility], you and your health needs are our priority.  As part of our continuing mission to provide you with exceptional heart care, we have created designated Provider Care Teams.  These Care Teams include your primary Cardiologist (physician) and Advanced Practice Providers (APPs -  Physician Assistants and Nurse Practitioners) who all work together to provide you with the care you need, when you need it.  We recommend signing up for the patient portal called "MyChart".  Sign up information is provided on this After Visit Summary.  MyChart is used to connect with patients for Virtual Visits (Telemedicine).  Patients are able to view lab/test results, encounter notes, upcoming appointments, etc.  Non-urgent messages can be sent to your provider as well.   To learn more about what you can do with MyChart, go to [email or web address].    Your next appointment:   6 month(s)  The format for your next appointment:   In Person  Provider:   Buford Dresser, MD    Other Instructions None

## 2022-01-20 ENCOUNTER — Ambulatory Visit (INDEPENDENT_AMBULATORY_CARE_PROVIDER_SITE_OTHER): Payer: Medicare Other | Admitting: Cardiology

## 2022-01-20 DIAGNOSIS — Z5181 Encounter for therapeutic drug level monitoring: Secondary | ICD-10-CM

## 2022-01-20 LAB — PROTIME-INR
INR: 2 — ABNORMAL HIGH (ref 0.9–1.2)
Prothrombin Time: 20.3 s — ABNORMAL HIGH (ref 9.1–12.0)

## 2022-01-20 NOTE — Patient Instructions (Signed)
Description   Called and spoke to pt and instructed her to: -Stop Lovenox -Then continue to take warfarin 2 tablets daily except  for 3 tablets on Tuesday and Saturdays. Recheck INR in 1 week. Call Coumadin Clinic [phone] Main # [phone] with any questions

## 2022-01-28 ENCOUNTER — Ambulatory Visit: Payer: Medicare Other | Attending: Internal Medicine

## 2022-01-28 DIAGNOSIS — I4891 Unspecified atrial fibrillation: Secondary | ICD-10-CM | POA: Diagnosis not present

## 2022-01-28 DIAGNOSIS — I480 Paroxysmal atrial fibrillation: Secondary | ICD-10-CM

## 2022-01-28 DIAGNOSIS — Z5181 Encounter for therapeutic drug level monitoring: Secondary | ICD-10-CM | POA: Diagnosis not present

## 2022-01-28 LAB — POCT INR: INR: 2.5 (ref 2.0–3.0)

## 2022-01-28 NOTE — Patient Instructions (Signed)
Description   Continue to take warfarin 2 tablets daily except for 3 tablets on Tuesday and Saturdays.  Recheck INR in 2 weeks.  Call Coumadin Clinic [phone] Main # [phone] with any questions

## 2022-02-16 ENCOUNTER — Ambulatory Visit: Payer: Medicare Other

## 2022-02-16 ENCOUNTER — Other Ambulatory Visit: Payer: Self-pay | Admitting: Internal Medicine

## 2022-02-16 DIAGNOSIS — Z5181 Encounter for therapeutic drug level monitoring: Secondary | ICD-10-CM

## 2022-02-17 ENCOUNTER — Telehealth: Payer: Self-pay | Admitting: Nurse Practitioner

## 2022-02-17 NOTE — Telephone Encounter (Signed)
Inbound call from patiently stating she has been having severe diarrhea and also has a foul smell for the lat 4 days.  Please advise.

## 2022-02-17 NOTE — Telephone Encounter (Signed)
Pt states foul smelling diarrhea started 4 days ago. Pt describes diarrhea as being a loose consistency. Pt reports she has severe lower abdominal pain that is sometimes on the right side. Pt is having diarrhea about 5 times per day. Pt states she has a history of constipation and sometimes diarrhea but the foul smelling diarrhea is new. Pt reports she has been nauseated as well and denies recent antibiotic use. Pt is currently taking bentyl for abd pain and zofran for nausea. Pt scheduled for follow up appointment with Tye Savoy NP on 03/06/22 at 1:30 pm.

## 2022-02-17 NOTE — Telephone Encounter (Signed)
If she is having significant diarrhea as described that is acute, can send her to the lab for GI pathogen panel (Diptherix swab would be fastest) to rule out infection and C Diff, if you can order for her. If she has fevers, worsening, etc, in the interim let us know. Thanks

## 2022-02-18 ENCOUNTER — Encounter: Payer: Self-pay | Admitting: Podiatry

## 2022-02-18 ENCOUNTER — Other Ambulatory Visit: Payer: Self-pay

## 2022-02-18 ENCOUNTER — Ambulatory Visit (INDEPENDENT_AMBULATORY_CARE_PROVIDER_SITE_OTHER): Payer: Medicare Other | Admitting: Podiatry

## 2022-02-18 DIAGNOSIS — D689 Coagulation defect, unspecified: Secondary | ICD-10-CM | POA: Diagnosis not present

## 2022-02-18 DIAGNOSIS — E1169 Type 2 diabetes mellitus with other specified complication: Secondary | ICD-10-CM | POA: Diagnosis not present

## 2022-02-18 DIAGNOSIS — B351 Tinea unguium: Secondary | ICD-10-CM

## 2022-02-18 DIAGNOSIS — M79674 Pain in right toe(s): Secondary | ICD-10-CM

## 2022-02-18 DIAGNOSIS — R197 Diarrhea, unspecified: Secondary | ICD-10-CM

## 2022-02-18 DIAGNOSIS — M79675 Pain in left toe(s): Secondary | ICD-10-CM

## 2022-02-18 NOTE — Telephone Encounter (Signed)
Called pt and spoke with pt's husband. Pt's husband stated pt was at an orthopedic appointment. Let pt's husband know I would call back later.

## 2022-02-18 NOTE — Progress Notes (Signed)
This patient returns to my office for at risk foot care.  This patient requires this care by a professional since this patient will be at risk due to having coagulation defect.  Patient is taking coumadin.  This patient is unable to cut nails herself since the patient cannot reach her nails.These nails are painful walking and wearing shoes.  This patient presents for at risk foot care today.  General Appearance  Alert, conversant and in no acute stress.  Vascular  Dorsalis pedis and posterior tibial  pulses are palpable  bilaterally.  Capillary return is within normal limits  bilaterally. Temperature is within normal limits  bilaterally.  Neurologic  Senn-Weinstein monofilament wire test within normal limits  bilaterally. Muscle power within normal limits bilaterally.  Nails Thick disfigured discolored nails with subungual debris  Second left and first and third right foot.  No evidence of bacterial infection or drainage bilaterally.  Orthopedic  No limitations of motion  feet .  No crepitus or effusions noted.  No bony pathology or digital deformities noted.  Skin  normotropic skin with no porokeratosis noted bilaterally.  No signs of infections or ulcers noted.     Onychomycosis  Pain in right toes  Pain in left toes  Consent was obtained for treatment procedures.   Mechanical debridement of nails 1-5  bilaterally performed with a nail nipper.  Filed with dremel without incident.    Return office visit   9 weeks                  Told patient to return for periodic foot care and evaluation due to potential at risk complications.   Gardiner Barefoot DPM

## 2022-02-19 ENCOUNTER — Ambulatory Visit: Payer: Medicare Other

## 2022-02-19 NOTE — Telephone Encounter (Signed)
Spoke with pt on 11/29 and let her know about stool test. Let pt know that stool test will be on 2nd floor at front desk for her to pick up and she needs to return test to lab. Lab order placed for diatherix stool test.

## 2022-02-23 ENCOUNTER — Telehealth: Payer: Self-pay

## 2022-02-23 NOTE — Telephone Encounter (Signed)
Thanks Jan. Can you let the patient know it is negative? She can try some immodium PRN if still having diarrhea. Thanks

## 2022-02-23 NOTE — Telephone Encounter (Signed)
Collected 02-20-2022, stool swab  Result reported 02-21-22: "Not detected"

## 2022-02-23 NOTE — Telephone Encounter (Signed)
Patient called to get results from stool test. Advised her it was negative and Dr.Armbruster suggested taking imodium if she was still having diarrhea. Patient did not have any questions at the end of the call.

## 2022-02-24 ENCOUNTER — Ambulatory Visit: Payer: Medicare Other | Attending: Cardiology | Admitting: *Deleted

## 2022-02-24 DIAGNOSIS — I480 Paroxysmal atrial fibrillation: Secondary | ICD-10-CM | POA: Diagnosis not present

## 2022-02-24 DIAGNOSIS — Z5181 Encounter for therapeutic drug level monitoring: Secondary | ICD-10-CM

## 2022-02-24 DIAGNOSIS — I4891 Unspecified atrial fibrillation: Secondary | ICD-10-CM

## 2022-02-24 LAB — POCT INR: INR: 1.9 — AB (ref 2.0–3.0)

## 2022-02-24 NOTE — Patient Instructions (Signed)
Description   Today take 3.5 tablets then continue to take warfarin 2 tablets daily except for 3 tablets on Tuesday and Saturdays. Recheck INR in 2 weeks.  Call Coumadin Clinic [phone] Main # [phone] with any questions

## 2022-03-04 ENCOUNTER — Other Ambulatory Visit: Payer: Self-pay | Admitting: Orthopaedic Surgery

## 2022-03-04 ENCOUNTER — Encounter: Payer: Self-pay | Admitting: Gastroenterology

## 2022-03-04 DIAGNOSIS — M25571 Pain in right ankle and joints of right foot: Secondary | ICD-10-CM

## 2022-03-06 ENCOUNTER — Ambulatory Visit (INDEPENDENT_AMBULATORY_CARE_PROVIDER_SITE_OTHER): Payer: Medicare Other | Admitting: Nurse Practitioner

## 2022-03-06 ENCOUNTER — Encounter: Payer: Self-pay | Admitting: Nurse Practitioner

## 2022-03-06 VITALS — BP 140/82 | HR 88 | Ht 64.0 in | Wt 217.0 lb

## 2022-03-06 DIAGNOSIS — I200 Unstable angina: Secondary | ICD-10-CM

## 2022-03-06 DIAGNOSIS — K219 Gastro-esophageal reflux disease without esophagitis: Secondary | ICD-10-CM | POA: Diagnosis not present

## 2022-03-06 MED ORDER — OMEPRAZOLE 40 MG PO CPDR: DELAYED_RELEASE_CAPSULE | ORAL | 0 refills | Status: DC

## 2022-03-06 NOTE — Progress Notes (Signed)
Agree with assessment and plan as outlined.  

## 2022-03-06 NOTE — Progress Notes (Signed)
Assessment    Patient profile:  [name] is a 66 y.o. female known to Dr. Havery Moros with a past medical history of diverticular disease status post sigmoid resection in 2019, A-fib with RVR on warfarin, Factor V deficiency, remote DVTs, vertigo, GERD, hiatal hernia ( 3 cm), large gastric polyps, hepatic steatosis.  See PMH /PSH for additional history  # 66 yo female with GERD without Barrett's. Doing well on BID PPI and anti-reflux measures.    # History of adenomatous colon polyps March 2023.  Four adenomas removed at time of last colonoscopy.  A 5-year recall colonoscopy was recommended   # Afib, on Coumadin  # Factor V deficiency  Plan   Sounds like PPI dose was increased to twice daily about a year ago. We discussed trying to reduce dose to once daily to be on the lowest effective dose. She will reduce Omeprazole to once daily but resume BID dosing for recurrent symptoms. She understands that she may not be able to be maintained on once daily dosing  Continue anti-reflux measures.    HPI    Chief complaint: Follow up on GERD and diarrhea   [name] was last seen 12/11/21 for evaluation of breakthrough GERD symptoms, please refer to that office note for details. In summary she was having breakthrough GERD symptoms. We discussed anti-reflux measures. BID Omeprazole was continued. She wasn't felt to be a great candidate for hiatal hernia repair.   02/17/22 patient called the office with complaints of diarrhea and abdominal pain.  Dr. Havery Moros recommended a GI pathogen panel / C-diff.  Stool studies were negative and she was advised to try Imodium as needed and was given this appointment today  Interval History:  Diarrhea had resolved. She is back taking Miralax. Her reflux symptoms are well controlled though we really didn't change anything at time of last visit in Sept. She had her knee surgery 8 weeks ago and is recovering nicely.   Previous GI Evaluation   April  2019 EGD - Esophagogastric landmarks identified. - 3 cm hiatal hernia. - Normal esophagus - Three gastric polyps in the cardia / hernia sac, ulcerated / inflamed. Injected with epinephrine and then resected and retrieved. 3 hemostasis clips were placed. - Multiple large gastric body polyps. 3 representative samples resected and retrieved. - Normal duodenal bulb and second portion of the duodenum.   Labs:     Latest Ref Rng & Units 01/08/2022    3:37 AM 01/07/2022    3:41 AM 12/24/2021   11:00 AM  CBC  WBC 4.0 - 10.5 K/uL 13.0  17.1  6.1   Hemoglobin 12.0 - 15.0 g/dL 11.2  11.9  13.8   Hematocrit 36.0 - 46.0 % 34.4  35.6  41.7   Platelets 150 - 400 K/uL 342  347  339        Latest Ref Rng & Units 03/29/2021    3:22 PM 06/03/2020    2:25 PM 12/22/2019   10:52 PM  Hepatic Function  Total Protein 6.5 - 8.1 g/dL 7.3  7.5  7.4   Albumin 3.5 - 5.0 g/dL 3.6  4.0  4.0   AST 15 - 41 U/L _0 ALT 0 - 44 U/L _1 Alk Phosphatase 38 - 126 U/L 67  65  78   Total Bilirubin 0.3 - 1.2 mg/dL 0.3  0.5  0.5   Bilirubin, Direct 0.0 - 0.2 mg/dL <  0.1        Past Medical History:  Diagnosis Date   Allergic rhinitis    Anginal pain ([place])    Arthritis    "back" (05/07/2016)   Asthma    Atrial flutter ([place])    a. Remotely per notes.   Carpal tunnel syndrome, bilateral    Colon polyps    Diverticulitis    Dizziness    DVT (deep venous thrombosis) ([place]) 1980s x2 -    between birth of two sons. Saw hematology - was told she has a hypercoagulable disorder, should be on Coumadin lifelong.   Dysrhythmia    atrial fib   Family history of adverse reaction to anesthesia    "mother gets PONV"   Gallbladder disease    GERD (gastroesophageal reflux disease)    GIB (gastrointestinal bleeding) 04/14/2018   Halothane adverse reaction    narrow small opening   Heart murmur    Hiatal hernia    History of hiatal hernia    "small one/CTA 05/01/2016" (05/07/2016)   History of kidney stones     HTN (hypertension)    Hypercholesteremia    hx; "brought it down w/diet" (05/07/2016)   Hyperglycemia    a. A1c 6.1 in 2014.   Inguinal hernia    Iron deficiency anemia due to chronic blood loss 04/14/2018   Iron malabsorption 04/14/2018   Left sided sciatica    Normal cardiac stress test 06/2012   Obesity    OSA (obstructive sleep apnea)    a. Mild, did not tolerate CPAP (05/07/2016)   PAF (paroxysmal atrial fibrillation) ([place])    a. s/p afib ablation [facility] (Dr. Annabell Howells). b. Recurrent AF s/p DCCV 06/2010. c. On flecainide.    Paroxysmal atrial fibrillation ([place]) 09/09/2009   Qualifier: Diagnosis of  By: Selena Batten CMA, [place]     PONV (postoperative nausea and vomiting)    PPD positive, treated 1977   "treated for 1 yr after exposure to patient"   Pre-diabetes    RSV infection ~ 2006   Vertigo    Wandering (atrial) pacemaker    a. Remotely per notes.  (  pt. states has a wandering p wave ) no pacemaker    Past Surgical History:  Procedure Laterality Date   ANKLE ARTHODESIS W/ ARTHROSCOPY Right 11/12/2020   ATRIAL ABLATION SURGERY  05/07/2016   "fib and flutter"   ATRIAL FIBRILLATION ABLATION  2010   at [facility] N/A 05/07/2016   Procedure: Atrial Fibrillation Ablation;  Surgeon: Thompson Grayer, MD;  Location: [facility];  Service: Cardiovascular;  Laterality: N/A;   BREAST CYST ASPIRATION Bilateral    CARDIAC CATHETERIZATION  2008   CARPAL TUNNEL RELEASE Bilateral    CATARACT EXTRACTION Bilateral 2022   CESAREAN SECTION  1986; 1988   COLON RESECTION     COLONOSCOPY  X 2   COLONOSCOPY W/ BIOPSIES AND POLYPECTOMY  X 2   CYSTOSCOPY/URETEROSCOPY/HOLMIUM LASER/STENT PLACEMENT Left 12/23/2019   Procedure: CYSTOSCOPY/URETEROSCOPY/HOLMIUM LASER/STENT PLACEMENT;  Surgeon: Robley Fries, MD;  Location: [facility];  Service: Urology;  Laterality: Left;   ESOPHAGOGASTRODUODENOSCOPY     ESOPHAGOGASTRODUODENOSCOPY (EGD) WITH  PROPOFOL N/A 07/19/2017   Procedure: ESOPHAGOGASTRODUODENOSCOPY (EGD) WITH PROPOFOL;  Surgeon: Yetta Flock, MD;  Location: [facility];  Service: Gastroenterology;  Laterality: N/A;   INGUINAL HERNIA REPAIR Right    KNEE ARTHROSCOPY Right 06/10/2021   Procedure: RIGHT KNEE ARTHROSCOPY;  Surgeon: Melrose Nakayama, MD;  Location: [facility];  Service: Orthopedics;  Laterality: Right;   [place]   POLYPECTOMY N/A 07/19/2017   Procedure: POLYPECTOMY;  Surgeon: Yetta Flock, MD;  Location: [facility];  Service: Gastroenterology;  Laterality: N/A;   PROCTOSCOPY N/A 08/06/2017   Procedure: PROCTOSCOPY;  Surgeon: Michael Boston, MD;  Location: [facility];  Service: General;  Laterality: N/A;   TMJ ARTHROPLASTY     TOTAL KNEE ARTHROPLASTY Right 01/06/2022   Procedure: RIGHT TOTAL KNEE ARTHROPLASTY;  Surgeon: Melrose Nakayama, MD;  Location: [facility];  Service: Orthopedics;  Laterality: Right;   TUBAL LIGATION  1988    Current Medications, Allergies, Family History and Social History were reviewed in [employer] record.     Current Outpatient Medications  Medication Sig Dispense Refill   acetaminophen (TYLENOL) 650 MG CR tablet Take 1,300 mg by mouth every 8 (eight) hours as needed for pain.      APPLE CIDER VINEGAR PO Take 450 mg by mouth in the morning.     cetirizine (ZYRTEC) 10 MG tablet Take 10 mg by mouth in the morning.     Cholecalciferol (VITAMIN D3 PO) Take 2,000 Units by mouth in the morning.     dicyclomine (BENTYL) 10 MG capsule TAKE 1 CAPSULE BY MOUTH 30 MINS BEFORE MEALS AND AT BEDTIME AS NEEDED FOR PAIN/CRAMPING 360 capsule 1   ezetimibe (ZETIA) 10 MG tablet TAKE 1 TABLET BY MOUTH EVERY DAY 90 tablet 3   FLOVENT HFA 220 MCG/ACT inhaler Inhale 1 puff into the lungs 2 (two) times daily.  1   fluticasone (FLONASE) 50 MCG/ACT nasal spray Place 1 spray into both nostrils 2 (two) times daily.      isosorbide mononitrate (IMDUR) 30 MG  24 hr tablet TAKE 1 TABLET BY MOUTH EVERY DAY (Patient taking differently: Take 30 mg by mouth every evening.) 90 tablet 3   losartan (COZAAR) 25 MG tablet Take 1 tablet (25 mg total) by mouth daily. 90 tablet 0   meclizine (ANTIVERT) 25 MG tablet Take 1 tablet (25 mg total) by mouth 3 (three) times daily as needed for dizziness. 30 tablet 0   Multiple Vitamin (MULTIVITAMIN WITH MINERALS) TABS tablet Take 1 tablet by mouth in the morning.     nitroGLYCERIN (NITROSTAT) 0.4 MG SL tablet PLACE 1 TABLET UNDER THE TONGUE EVERY 5 MINUTES AS NEEDED. 25 tablet 11   omeprazole (PRILOSEC) 40 MG capsule Take 1 capsule (40 mg total) by mouth 2 (two) times daily. Decrease to one daily after 6 weeks if symptoms are maintained. 180 capsule 0   oxyCODONE-acetaminophen (PERCOCET) 5-325 MG tablet Take 1-2 tablets by mouth every 6 (six) hours as needed for severe pain (post op pain). (Patient not taking: Reported on 02/24/2022) 40 tablet 0   polyethylene glycol (MIRALAX) 17 g packet Take 17 g by mouth 2 (two) times daily. Titrate as needed (Patient taking differently: Take 17 g by mouth 2 (two) times daily as needed (constipation.).) 14 each 0   PROAIR HFA 108 (90 BASE) MCG/ACT inhaler Inhale 2 puffs into the lungs every 6 (six) hours as needed for wheezing or shortness of breath.      traMADol (ULTRAM) 50 MG tablet Take 1 tablet (50 mg total) by mouth 3 (three) times daily as needed for moderate pain (post op pain). 30 tablet 0   venlafaxine XR (EFFEXOR-XR) 37.5 MG 24 hr capsule Take 37.5 mg by mouth daily with breakfast.      verapamil (CALAN) 80 MG tablet Take 160 mg  by mouth 2 (two) times daily.     warfarin (COUMADIN) 2 MG tablet TAKE 4MG DAILY EXCEPT 6MG ON TUESDAYS, THURSDAYS,AND SATURDAYS AS DIRECTED BY ANTICOAGULATION CLINIC (Patient taking differently: Take 4-6 mg by mouth See admin instructions. Take 3 tablets (6 mg) by mouth on Tuesdays & Saturdays in the evening.  Take 2 tablets (3 mg) by mouth on Sundays,  Mondays, Wednesdays, Thursdays & Fridays in the evening.) 225 tablet 1   No current facility-administered medications for this visit.    Review of Systems: No chest pain. No shortness of breath. No urinary complaints.    Physical Exam  Wt Readings from Last 3 Encounters:  01/19/22 225 lb (102.1 kg)  01/06/22 224 lb (101.6 kg)  12/24/21 224 lb (101.6 kg)    BP (!) 140/82   Pulse 88   Ht _0  (1.626 m)   Wt 217 lb (98.4 kg)   BMI 37.25 kg/m  Constitutional:  Generally well appearing female in no acute distress. Psychiatric: Pleasant. Normal mood and affect. Behavior is normal. EENT: Pupils normal.  Conjunctivae are normal. No scleral icterus. Neck supple.  Cardiovascular: Normal rate, regular rhythm.  Pulmonary/chest: Effort normal and breath sounds normal. No wheezing, rales or rhonchi. Abdominal: Soft, nondistended, nontender. Bowel sounds active throughout. There are no masses palpable. No hepatomegaly. Neurological: Alert and oriented to person place and time. Musculoskeletal: No edema Skin: Skin is warm and dry. No rashes noted.  Tye Savoy, NP  03/06/2022, 10:39 AM

## 2022-03-06 NOTE — Patient Instructions (Signed)
_______________________________________________________  If you are age 67 or older, your body mass index should be between 23-30. Your Body mass index is 37.25 kg/m. If this is out of the aforementioned range listed, please consider follow up with your Primary Care Provider. ________________________________________________________  The [facility] providers would like to encourage you to use [facility] to communicate with providers for non-urgent requests or questions.  Due to long hold times on the telephone, sending your provider a message by [facility] may be a faster and more efficient way to get a response.  Please allow 48 business hours for a response.  Please remember that this is for non-urgent requests.  _______________________________________________________  Dennis Bast can decrease Omeprazole to '40mg'$  daily before breakfast.  If recurrent reflux symptoms, then increase Omeprazole to twice daily.  Follow up as needed.   Thank you for entrusting me with your care and choosing [facility].  Tye Savoy, PA-C

## 2022-03-11 ENCOUNTER — Other Ambulatory Visit: Payer: Medicare Other

## 2022-03-13 ENCOUNTER — Ambulatory Visit
Admission: RE | Admit: 2022-03-13 | Discharge: 2022-03-13 | Disposition: A | Discharge status: Home / Self Care | Payer: Medicare Other | Source: Ambulatory Visit | Attending: Orthopaedic Surgery | Admitting: Orthopaedic Surgery

## 2022-03-13 DIAGNOSIS — M25571 Pain in right ankle and joints of right foot: Secondary | ICD-10-CM

## 2022-03-17 ENCOUNTER — Ambulatory Visit: Payer: Medicare Other | Attending: Internal Medicine

## 2022-03-17 DIAGNOSIS — Z5181 Encounter for therapeutic drug level monitoring: Secondary | ICD-10-CM

## 2022-03-17 DIAGNOSIS — Z01810 Encounter for preprocedural cardiovascular examination: Secondary | ICD-10-CM

## 2022-03-17 DIAGNOSIS — I480 Paroxysmal atrial fibrillation: Secondary | ICD-10-CM

## 2022-03-17 DIAGNOSIS — I4891 Unspecified atrial fibrillation: Secondary | ICD-10-CM

## 2022-03-17 LAB — POCT INR: INR: 2.4 (ref 2.0–3.0)

## 2022-03-17 NOTE — Patient Instructions (Signed)
continue to take warfarin 2 tablets daily except for 3 tablets on Tuesday and Saturdays. Recheck INR in 6 weeks.  Call [facility] [phone] Main # [phone] with any questions

## 2022-04-14 ENCOUNTER — Other Ambulatory Visit: Payer: Self-pay | Admitting: Internal Medicine

## 2022-04-27 ENCOUNTER — Ambulatory Visit (INDEPENDENT_AMBULATORY_CARE_PROVIDER_SITE_OTHER): Payer: Medicare Other | Admitting: Podiatry

## 2022-04-27 ENCOUNTER — Encounter: Payer: Self-pay | Admitting: Podiatry

## 2022-04-27 DIAGNOSIS — M79674 Pain in right toe(s): Secondary | ICD-10-CM

## 2022-04-27 DIAGNOSIS — B351 Tinea unguium: Secondary | ICD-10-CM | POA: Diagnosis not present

## 2022-04-27 DIAGNOSIS — M79675 Pain in left toe(s): Secondary | ICD-10-CM

## 2022-04-27 DIAGNOSIS — E1169 Type 2 diabetes mellitus with other specified complication: Secondary | ICD-10-CM | POA: Diagnosis not present

## 2022-04-27 NOTE — Progress Notes (Signed)
This patient returns to my office for at risk foot care.  This patient requires this care by a professional since this patient will be at risk due to having coagulation defect.  Patient is taking coumadin.  This patient is unable to cut nails herself since the patient cannot reach her nails.These nails are painful walking and wearing shoes.  This patient presents for at risk foot care today.  General Appearance  Alert, conversant and in no acute stress.  Vascular  Dorsalis pedis and posterior tibial  pulses are palpable  bilaterally.  Capillary return is within normal limits  bilaterally. Temperature is within normal limits  bilaterally.  Neurologic  Senn-Weinstein monofilament wire test within normal limits  bilaterally. Muscle power within normal limits bilaterally.  Nails Thick disfigured discolored nails with subungual debris  Second left and first and third right foot.  No evidence of bacterial infection or drainage bilaterally.  Orthopedic  No limitations of motion  feet .  No crepitus or effusions noted.  No bony pathology or digital deformities noted.  Skin  normotropic skin with no porokeratosis noted bilaterally.  No signs of infections or ulcers noted.     Onychomycosis  Pain in right toes  Pain in left toes  Consent was obtained for treatment procedures.   Mechanical debridement of nails 1-5  bilaterally performed with a nail nipper.  Filed with dremel without incident.    Return office visit   9 weeks                  Told patient to return for periodic foot care and evaluation due to potential at risk complications.   Gardiner Barefoot DPM

## 2022-04-28 ENCOUNTER — Ambulatory Visit: Payer: Medicare Other | Attending: Internal Medicine | Admitting: *Deleted

## 2022-04-28 DIAGNOSIS — Z5181 Encounter for therapeutic drug level monitoring: Secondary | ICD-10-CM | POA: Diagnosis present

## 2022-04-28 DIAGNOSIS — I480 Paroxysmal atrial fibrillation: Secondary | ICD-10-CM

## 2022-04-28 DIAGNOSIS — I4891 Unspecified atrial fibrillation: Secondary | ICD-10-CM | POA: Diagnosis present

## 2022-04-28 LAB — POCT INR: INR: 2.5 (ref 2.0–3.0)

## 2022-04-28 NOTE — Patient Instructions (Signed)
Description   Continue taking warfarin 2 tablets daily except for 3 tablets on Tuesday and Saturdays. Recheck INR in 6 weeks.  Call Coumadin Clinic [phone] Main # [phone] with any questions

## 2022-04-29 ENCOUNTER — Other Ambulatory Visit: Payer: Self-pay | Admitting: Internal Medicine

## 2022-04-29 ENCOUNTER — Other Ambulatory Visit: Payer: Self-pay | Admitting: Nurse Practitioner

## 2022-06-01 NOTE — Progress Notes (Unsigned)
Cardiology Office Note Date:  06/01/2022  Patient ID:  [name], DOB [id number], MRN [id number] PCP:  Antony Contras, MD  Electrophysiologist: Dr. Quentin Ore Cardiologist: Dr. Harrell Gave  Chief Complaint:  53mo History of Present Illness: Kelly QUESTis a 67y.o. female with history of RA, HTN, HLD, DVT (recurrent), AFib  She comes in today to be seen for Dr. ARayann Heman last seen by him Nov 2022, she had been feeling some palpitations, kardia tracings without AFib/arrhythmias Planned for labs and updated echo Also mentioned dizziness, c/w Vertigo  She had a hospital stay 03/29/21 - 03/30/21 for CP, neg trops, nonischemic EKG, Kardia tracings without arrhythmia.  Coronary CT with minimal CAD, ca++ 68  I saw her 05/12/21 She discusses a few symptoms: Known Vertigo, She has seen her PMD and neurologist has completed therapy with improvement in this spinning sensation, typically provoked by turning her head left. She has also seen ENT who felt her ears were OK though had signs of GERD on her throat and her Prilosec doubled, with improvement in her GERD She has seen GI who continued the same Prilosec and plans for colonoscopy w/hx of polyps and diverticulitis She has had trouble with recurrent sinus infections Postural lightheadedness, this dates back quite a while, with no real change in the bahvior of thie symptom, she has not had syncope, will sometimes need to sit, put her head down to resolve her lightheadedness. Palpitations, these seemed to flare up intermittently and keeps track via her KJodelle Red some time ago she self increased her verapamil back to '160mg'$  BID and this has settled these pretty well, and did NOT worsen her postural symptom. CP.  This continues to be a problem. She has no CP at rest or with ADLs, but will get CP with heavier activities like scrubbing the floor, heavy vacuuming, is it central/left of sternum and radiates to between her shoulder blades, it will resolve  with rest and or s/l NTG. She does not percive palpitations or seen Afib with her CP Since home from her Jan hospital stay has used NTG perhaps 6 times. Imdur was added to her regime She reported statin intolerance, planned to refer to general cardiology as well as Dr. LQuentin Orefor new EP MD  She saw Dr. LQuentin Ore6/20/23, doing well, no changes were made  She has seen cardiology team a few times, most recently Dr. CHarrell GaveOct 2023, again doing well, was post knee surgery, working on advancing activities, no changes were made.  TODAY She has had an uptick in symptoms, particularly her palpitations. 2/13/ had about 4 hours of AFib (confirmed to her by kChadtracing) and recognized symptom, gets an ache across her back when in AFib 3 days ago had some pounding and caught about 8 seconds of V. Trigeminy (she is a retired RTherapist, sports Last night had chest discomfort stabbing/pounding starts front rad to back, can last hours, took her PMD meds and some time later stopped, kardia noted NSR This AM on the way here had some chest discomfort again, caught one PVC on her kadia. Eased off here  This CP/chest symptom that she has, goes back/pre-dates her coronary CT last year with no significant CAD and neg Trops.  This is reassuring. It does not seem especially connected to rhythm. I discussed she consider talking with her PMD for possible non-cardiac etiologies as well.  She has some orthostatic dizziness  Her symptoms, palpitations are anxiety provoking, she gets a little emotional today in discussing that  historically not taken seriously.  No bleeding or signs of bleeding   AFib hx AFib ablation at [facility] 2010, Dr. Annabell Howells PVI and CTI ablation 05/07/2016  AAD Hx Flecainide dose limited with development of BBB > failed to maintain SR and stopped Dec 2017 Multaq stopped post PVI ablation 2018   Past Medical History:  Diagnosis Date   Allergic rhinitis    Anginal pain ([place])    Arthritis     "back" (05/07/2016)   Asthma    Atrial flutter ([place])    a. Remotely per notes.   Carpal tunnel syndrome, bilateral    Colon polyps    Diverticulitis    Dizziness    DVT (deep venous thrombosis) ([place]) 1980s x2 -    between birth of two sons. Saw hematology - was told she has a hypercoagulable disorder, should be on Coumadin lifelong.   Dysrhythmia    atrial fib   Family history of adverse reaction to anesthesia    "mother gets PONV"   Gallbladder disease    GERD (gastroesophageal reflux disease)    GIB (gastrointestinal bleeding) 04/14/2018   Halothane adverse reaction    narrow small opening   Heart murmur    Hiatal hernia    History of hiatal hernia    "small one/CTA 05/01/2016" (05/07/2016)   History of kidney stones    HTN (hypertension)    Hypercholesteremia    hx; "brought it down w/diet" (05/07/2016)   Hyperglycemia    a. A1c 6.1 in 2014.   Inguinal hernia    Iron deficiency anemia due to chronic blood loss 04/14/2018   Iron malabsorption 04/14/2018   Left sided sciatica    Normal cardiac stress test 06/2012   Obesity    OSA (obstructive sleep apnea)    a. Mild, did not tolerate CPAP (05/07/2016)   PAF (paroxysmal atrial fibrillation) ([place])    a. s/p afib ablation at [facility] 2010 (Dr. Annabell Howells). b. Recurrent AF s/p DCCV 06/2010. c. On flecainide.    Paroxysmal atrial fibrillation ([place]) 09/09/2009   Qualifier: Diagnosis of  By: Selena Batten CMA, Jew[place]     PONV (postoperative nausea and vomiting)    PPD positive, treated 1977   "treated for 1 yr after exposure to patient"   Pre-diabetes    RSV infection ~ 2006   Vertigo    Wandering (atrial) pacemaker    a. Remotely per notes.  (  pt. states has a wandering p wave ) no pacemaker    Past Surgical History:  Procedure Laterality Date   ANKLE ARTHODESIS W/ ARTHROSCOPY Right 11/12/2020   ATRIAL ABLATION SURGERY  05/07/2016   "fib and flutter"   ATRIAL FIBRILLATION ABLATION  2010   at [place] N/A 05/07/2016   Procedure: Atrial Fibrillation Ablation;  Surgeon: Thompson Grayer, MD;  Location: [place] [facility];  Service: Cardiovascular;  Laterality: N/A;   BREAST CYST ASPIRATION Bilateral    CARDIAC CATHETERIZATION  2008   CARPAL TUNNEL RELEASE Bilateral    CATARACT EXTRACTION Bilateral 2022   CESAREAN SECTION  1986; 1988   COLON RESECTION     COLONOSCOPY  X 2   COLONOSCOPY W/ BIOPSIES AND POLYPECTOMY  X 2   CYSTOSCOPY/URETEROSCOPY/HOLMIUM LASER/STENT PLACEMENT Left 12/23/2019   Procedure: CYSTOSCOPY/URETEROSCOPY/HOLMIUM LASER/STENT PLACEMENT;  Surgeon: Robley Fries, MD;  Location: [facility];  Service: Urology;  Laterality: Left;   ESOPHAGOGASTRODUODENOSCOPY     ESOPHAGOGASTRODUODENOSCOPY (EGD) WITH PROPOFOL N/A 07/19/2017   Procedure: ESOPHAGOGASTRODUODENOSCOPY (  EGD) WITH PROPOFOL;  Surgeon: Yetta Flock, MD;  Location: [facility];  Service: Gastroenterology;  Laterality: N/A;   INGUINAL HERNIA REPAIR Right    KNEE ARTHROSCOPY Right 06/10/2021   Procedure: RIGHT KNEE ARTHROSCOPY;  Surgeon: Melrose Nakayama, MD;  Location: [facility];  Service: Orthopedics;  Laterality: Right;   [place]   POLYPECTOMY N/A 07/19/2017   Procedure: POLYPECTOMY;  Surgeon: Yetta Flock, MD;  Location: [facility];  Service: Gastroenterology;  Laterality: N/A;   PROCTOSCOPY N/A 08/06/2017   Procedure: PROCTOSCOPY;  Surgeon: Michael Boston, MD;  Location: [facility];  Service: General;  Laterality: N/A;   TMJ ARTHROPLASTY     TOTAL KNEE ARTHROPLASTY Right 01/06/2022   Procedure: RIGHT TOTAL KNEE ARTHROPLASTY;  Surgeon: Melrose Nakayama, MD;  Location: [facility];  Service: Orthopedics;  Laterality: Right;   TUBAL LIGATION  1988    Current Outpatient Medications  Medication Sig Dispense Refill   acetaminophen (TYLENOL) 650 MG CR tablet Take 1,300 mg by mouth every 8 (eight) hours as needed for pain.      APPLE CIDER VINEGAR PO Take 450 mg by mouth  in the morning.     cetirizine (ZYRTEC) 10 MG tablet Take 10 mg by mouth in the morning.     Cholecalciferol (VITAMIN D3 PO) Take 2,000 Units by mouth in the morning.     dicyclomine (BENTYL) 10 MG capsule TAKE 1 CAPSULE BY MOUTH 30 MINS BEFORE MEALS AND AT BEDTIME AS NEEDED FOR PAIN/CRAMPING 360 capsule 1   ezetimibe (ZETIA) 10 MG tablet TAKE 1 TABLET BY MOUTH EVERY DAY 90 tablet 0   FLOVENT HFA 220 MCG/ACT inhaler Inhale 1 puff into the lungs 2 (two) times daily.  1   fluticasone (FLONASE) 50 MCG/ACT nasal spray Place 1 spray into both nostrils 2 (two) times daily.      isosorbide mononitrate (IMDUR) 30 MG 24 hr tablet TAKE 1 TABLET BY MOUTH EVERY DAY (Patient taking differently: Take 30 mg by mouth every evening.) 90 tablet 3   losartan (COZAAR) 25 MG tablet Take 1 tablet (25 mg total) by mouth daily. 90 tablet 0   meclizine (ANTIVERT) 25 MG tablet Take 1 tablet (25 mg total) by mouth 3 (three) times daily as needed for dizziness. 30 tablet 0   Multiple Vitamin (MULTIVITAMIN WITH MINERALS) TABS tablet Take 1 tablet by mouth in the morning.     nitroGLYCERIN (NITROSTAT) 0.4 MG SL tablet PLACE 1 TABLET UNDER THE TONGUE EVERY 5 MINUTES AS NEEDED. 25 tablet 11   omeprazole (PRILOSEC) 40 MG capsule TAKE 1 CAPSULE BY MOUTH DAILY, CAN INCREASE TO 2 TIMES DAILY IF NEEDED 180 capsule 0   oxyCODONE-acetaminophen (PERCOCET) 5-325 MG tablet Take 1-2 tablets by mouth every 6 (six) hours as needed for severe pain (post op pain). 40 tablet 0   polyethylene glycol (MIRALAX) 17 g packet Take 17 g by mouth 2 (two) times daily. Titrate as needed (Patient taking differently: Take 17 g by mouth 2 (two) times daily as needed (constipation.).) 14 each 0   PROAIR HFA 108 (90 BASE) MCG/ACT inhaler Inhale 2 puffs into the lungs every 6 (six) hours as needed for wheezing or shortness of breath.      promethazine (PHENERGAN) 12.5 MG tablet Take 12.5-25 mg by mouth every 6 (six) hours as needed. (Patient not taking: Reported  on 03/06/2022)     sucralfate (CARAFATE) 1 g tablet Take 1 tablet before meals up to 3 times daily. (Patient taking differently: Take  1 g by mouth 3 (three) times daily as needed (stomach issues).)     traMADol (ULTRAM) 50 MG tablet Take 1 tablet (50 mg total) by mouth 3 (three) times daily as needed for moderate pain (post op pain). 30 tablet 0   venlafaxine XR (EFFEXOR-XR) 37.5 MG 24 hr capsule Take 37.5 mg by mouth daily with breakfast.      verapamil (CALAN) 80 MG tablet Take 160 mg by mouth 2 (two) times daily.     warfarin (COUMADIN) 2 MG tablet TAKE '4MG'$  DAILY EXCEPT '6MG'$  ON TUESDAYS, THURSDAYS,AND SATURDAYS AS DIRECTED BY ANTICOAGULATION CLINIC (Patient taking differently: Take 4-6 mg by mouth See admin instructions. Take 3 tablets (6 mg) by mouth on Tuesdays & Saturdays in the evening.  Take 2 tablets (3 mg) by mouth on Sundays, Mondays, Wednesdays, Thursdays & Fridays in the evening.) 225 tablet 1   No current facility-administered medications for this visit.    Allergies:   Aspirin, Banana, Food, Iodinated contrast media, Latex, Meperidine hcl, Peanut-containing drug products, Penicillins, Statins, Sulfonamide derivatives, Tape, Alfuzosin, Halothane, Other, and Sulfamethoxazole   Social History:  The patient  reports that she has never smoked. She has been exposed to tobacco smoke. She has never used smokeless tobacco. She reports that she does not currently use alcohol. She reports that she does not use drugs.   Family History:  The patient's family history includes Colon polyps in her father; Diabetes in her maternal grandmother; Heart attack in her maternal grandmother, mother, and paternal grandfather; Hyperlipidemia in her father and maternal grandmother; Hypertension in her maternal grandfather, maternal grandmother, and mother; Lupus in her sister; Stroke in her maternal grandfather, mother, and paternal grandmother; Thyroid disease in her mother and sister.  ROS:  Please see the  history of present illness.    All other systems are reviewed and otherwise negative.   PHYSICAL EXAM:  VS:  There were no vitals taken for this visit. BMI: There is no height or weight on file to calculate BMI. Well nourished, well developed, in no acute distress HEENT: normocephalic, atraumatic Neck: no JVD, carotid bruits or masses Cardiac: RRR; no significant murmurs, no rubs, or gallops Lungs:  CTA b/l, no wheezing, rhonchi or rales Abd: soft, nontender MS: no deformity or atrophy Ext: no edema Skin: warm and dry, no rash Neuro:  No gross deficits appreciated Psych: euthymic mood, full affect   EKG:  done today and reviewed by myself SR 90bpm, unchanged from last  04/09/21: Coronary CT IMPRESSION: 1. Coronary calcium score of 68. This was 84 percentile for age and sex matched control.   2. Normal coronary origin with right dominance.   3. CAD-RADS 2. Mild non-obstructive CAD (25-49%). Consider non-atherosclerotic causes of chest pain. Consider preventive therapy and risk factor modification.   03/14/21: TTE  1. Left ventricular ejection fraction, by estimation, is 60 to 65%. The  left ventricle has normal function. The left ventricle has no regional  wall motion abnormalities. Left ventricular diastolic parameters are  consistent with Grade II diastolic  dysfunction (pseudonormalization). Elevated left ventricular end-diastolic  pressure.   2. Right ventricular systolic function is normal. The right ventricular  size is normal.   3. Left atrial size was mild to moderately dilated.   4. The mitral valve is grossly normal. Mild mitral valve regurgitation.  No evidence of mitral stenosis.   5. The aortic valve is grossly normal. Aortic valve regurgitation is not  visualized. No aortic stenosis is present.   6.  The inferior vena cava is normal in size with <50% respiratory  variability, suggesting right atrial pressure of 8 mmHg.   Comparison(s): No significant  change from prior study.   05/07/2016; EPS/ablation CONCLUSIONS: 1. Sinus rhythm upon presentation.   2. Intracardiac echo reveals a moderate sized left atrium with four separate pulmonary veins without evidence of pulmonary vein stenosis. 3. Ep study demonstrated electrical activity within the right superior and inferior pulmonary veins at baseline.  There was exit conduction through the left inferior pulmonary vein.  The left superior pulmonary vein was quiescent from the prior ablation procedure.  4. Successful sequential electrical isolation and anatomical encircling of the left inferior, right superior and right inferior pulmonary veins.  A WACA approach was used along the right PVs.  The left inferior pulmonary vein was isolated along the anterior and inferior quadrant.    Extensive ablation was required along the carina between the RSPV and RIPV. 5. Cavo-tricuspid isthmus ablation was performed with complete bidirectional isthmus block achieved.  6. No inducible arrhythmias following ablation both on and off of Isuprel 7. No early apparent complications.  Recent Labs: 12/24/2021: BUN 12; Potassium 3.9; Sodium 135 01/08/2022: Creatinine, Ser 0.67; Hemoglobin 11.2; Platelets 342  No results found for requested labs within last 365 days.   CrCl cannot be calculated (Patient's most recent lab result is older than the maximum 21 days allowed.).   Wt Readings from Last 3 Encounters:  03/06/22 217 lb (98.4 kg)  01/19/22 225 lb (102.1 kg)  01/06/22 224 lb (101.6 kg)     Other studies reviewed: Additional studies/records reviewed today include: summarized above  ASSESSMENT AND PLAN:  Paroxysmal Afib CHA2DS2Vasc is 5, on warfarin,  monitored and managed with [place]  Increase in her palpitations Add Toprol '25mg'$  daily  HTN Looks ok 3. Postural lightheadedness Chronic and unchanged No syncope  I have asked her to monitor her BP  4. CP CTA with no obstructive CAD though exertional  angina, not unstable Chronic sounding, predates her coronary CT C/w cardiology team  5. She has observed PVCs   Will have her wear a 7 day monitor, assess PVC burden Symptoms Update her echo  Labs today   Disposition: Ill have her back in 2 mo, sooner if needed  Current medicines are reviewed at length with the patient today.  The patient did not have any concerns regarding medicines.  Venetia Night, PA-C 06/01/2022 7:13 AM     [facility] [address] [place] [address] [phone] (office)  [phone] (fax)

## 2022-06-03 ENCOUNTER — Encounter: Payer: Self-pay | Admitting: Physician Assistant

## 2022-06-03 ENCOUNTER — Ambulatory Visit (INDEPENDENT_AMBULATORY_CARE_PROVIDER_SITE_OTHER): Payer: Medicare Other

## 2022-06-03 ENCOUNTER — Ambulatory Visit: Payer: Medicare Other | Attending: Physician Assistant | Admitting: Physician Assistant

## 2022-06-03 VITALS — BP 118/74 | HR 87 | Ht 64.0 in | Wt 212.4 lb

## 2022-06-03 DIAGNOSIS — R002 Palpitations: Secondary | ICD-10-CM | POA: Diagnosis present

## 2022-06-03 DIAGNOSIS — I493 Ventricular premature depolarization: Secondary | ICD-10-CM | POA: Diagnosis present

## 2022-06-03 DIAGNOSIS — I10 Essential (primary) hypertension: Secondary | ICD-10-CM | POA: Insufficient documentation

## 2022-06-03 DIAGNOSIS — R42 Dizziness and giddiness: Secondary | ICD-10-CM

## 2022-06-03 DIAGNOSIS — I480 Paroxysmal atrial fibrillation: Secondary | ICD-10-CM | POA: Diagnosis present

## 2022-06-03 LAB — CBC

## 2022-06-03 MED ORDER — METOPROLOL SUCCINATE ER 25 MG PO TB24: 25.0000 mg | ORAL_TABLET | Freq: Every day | ORAL | 2 refills | Status: DC

## 2022-06-03 NOTE — Progress Notes (Unsigned)
Enrolled patient for a 7 day Zio XT monitor to be mailed to patients home  ° °[place] to read °

## 2022-06-03 NOTE — Patient Instructions (Addendum)
Medication Instructions:    START TAKING:  TOPROL XL  25 MG ONCE A DAY   *If you need a refill on your cardiac medications before your next appointment, please call your pharmacy*   Lab Work:  BMET MAG AND  CBC TODAY    If you have labs (blood work) drawn today and your tests are completely normal, you will receive your results only by: [place] (if you have MyChart) OR A paper copy in the mail If you have any lab test that is abnormal or we need to change your treatment, we will call you to review the results.   Testing/Procedures: Your physician has requested that you have an echocardiogram. Echocardiography is a painless test that uses sound waves to create images of your heart. It provides your doctor with information about the size and shape of your heart and how well your heart's chambers and valves are working. This procedure takes approximately one hour. There are no restrictions for this procedure. Please do NOT wear cologne, perfume, aftershave, or lotions (deodorant is allowed). Please arrive 15 minutes prior to your appointment time.   Your physician has recommended that you wear an event monitor. Event monitors are medical devices that record the heart's electrical activity. Doctors most often [place] these monitors to diagnose arrhythmias. Arrhythmias are problems with the speed or rhythm of the heartbeat. The monitor is a small, portable device. You can wear one while you do your normal daily activities. This is usually used to diagnose what is causing palpitations/syncope (passing out).    Follow-Up: At [facility], you and your health needs are our priority.  As part of our continuing mission to provide you with exceptional heart care, we have created designated Provider Care Teams.  These Care Teams include your primary Cardiologist (physician) and Advanced Practice Providers (APPs -  Physician Assistants and Nurse Practitioners) who all work together to  provide you with the care you need, when you need it.  We recommend signing up for the patient portal called "MyChart".  Sign up information is provided on this After Visit Summary.  MyChart is used to connect with patients for Virtual Visits (Telemedicine).  Patients are able to view lab/test results, encounter notes, upcoming appointments, etc.  Non-urgent messages can be sent to your provider as well.   To learn more about what you can do with MyChart, go to [email or web address].    Your next appointment:   2 month(s)  Provider:   You will see one of the following Advanced Practice Providers on your designated Care Team:   Tommye Standard, [place]   Other Instructions  [place] Monitor Instructions  Your physician has requested you wear a ZIO patch monitor for 7  days.  This is a single patch monitor. [employer] supplies one patch monitor per enrollment. Additional stickers are not available. Please do not apply patch if you will be having a Nuclear Stress Test,  Echocardiogram, Cardiac CT, MRI, or Chest Xray during the period you would be wearing the  monitor. The patch cannot be worn during these tests. You cannot remove and re-apply the  ZIO XT patch monitor.  Your ZIO patch monitor will be mailed 3 day USPS to your address on file. It may take 3-5 days  to receive your monitor after you have been enrolled.  Once you have received your monitor, please review the enclosed instructions. Your monitor  has already been registered assigning a specific monitor serial # to  you.  Billing and Patient Assistance Program Information  We have supplied [employer] with any of your insurance information on file for billing purposes. [employer] offers a sliding scale Patient Assistance Program for patients that do not have  insurance, or whose insurance does not completely cover the cost of the ZIO monitor.  You must apply for the Patient Assistance Program to qualify for this discounted rate.   To apply, please call [employer] at [phone], select option 4, select option 2, ask to apply for  Patient Assistance Program. Theodore Demark will ask your household income, and how many people  are in your household. They will quote your out-of-pocket cost based on that information.  [employer] will also be able to set up a 52-month interest-free payment plan if needed.  Applying the monitor   Shave hair from upper left chest.  Hold abrader disc by orange tab. Rub abrader in 40 strokes over the upper left chest as  indicated in your monitor instructions.  Clean area with 4 enclosed alcohol pads. Let dry.  Apply patch as indicated in monitor instructions. Patch will be placed under collarbone on left  side of chest with arrow pointing upward.  Rub patch adhesive wings for 2 minutes. Remove white label marked "1". Remove the white  label marked "2". Rub patch adhesive wings for 2 additional minutes.  While looking in a mirror, press and release button in center of patch. A small green light will  flash 3-4 times. This will be your only indicator that the monitor has been turned on.  Do not shower for the first 24 hours. You may shower after the first 24 hours.  Press the button if you feel a symptom. You will hear a small click. Record Date, Time and  Symptom in the Patient Logbook.  When you are ready to remove the patch, follow instructions on the last 2 pages of Patient  Logbook. Stick patch monitor onto the last page of Patient Logbook.  Place Patient Logbook in the blue and white box. Use locking tab on box and tape box closed  securely. The blue and white box has prepaid postage on it. Please place it in the mailbox as  soon as possible. Your physician should have your test results approximately 7 days after the  [facility] has [facility]  Call [place] [phone] you have questions regarding  your ZIO XT patch monitor. Call them immediately  if you see an orange light blinking on your  monitor.  If your monitor falls off in less than 4 days, contact our Monitor department at [phone]  If your monitor becomes loose or falls off after 4 days call [employer] at [phone]  suggestions on securing your monitor

## 2022-06-04 LAB — CBC
Hematocrit: 41.3 % (ref 34.0–46.6)
Hemoglobin: 14 g/dL (ref 11.1–15.9)
MCH: 30.6 pg (ref 26.6–33.0)
MCHC: 33.9 g/dL (ref 31.5–35.7)
MCV: 90 fL (ref 79–97)
Platelets: 360 10*3/uL (ref 150–450)
RBC: 4.58 x10E6/uL (ref 3.77–5.28)
RDW: 14.5 % (ref 11.7–15.4)
WBC: 6.3 10*3/uL (ref 3.4–10.8)

## 2022-06-04 LAB — MAGNESIUM: Magnesium: 2.1 mg/dL (ref 1.6–2.3)

## 2022-06-04 LAB — BASIC METABOLIC PANEL
BUN/Creatinine Ratio: 17 (ref 12–28)
BUN: 12 mg/dL (ref 8–27)
CO2: 25 mmol/L (ref 20–29)
Calcium: 10 mg/dL (ref 8.7–10.3)
Chloride: 101 mmol/L (ref 96–106)
Creatinine, Ser: 0.69 mg/dL (ref 0.57–1.00)
Glucose: 139 mg/dL — ABNORMAL HIGH (ref 70–99)
Potassium: 4.3 mmol/L (ref 3.5–5.2)
Sodium: 139 mmol/L (ref 134–144)
eGFR: 94 mL/min/{1.73_m2} (ref >59)

## 2022-06-06 DIAGNOSIS — R002 Palpitations: Secondary | ICD-10-CM | POA: Diagnosis not present

## 2022-06-09 ENCOUNTER — Ambulatory Visit: Payer: Medicare Other | Attending: Cardiovascular Disease | Admitting: *Deleted

## 2022-06-09 DIAGNOSIS — I4891 Unspecified atrial fibrillation: Secondary | ICD-10-CM | POA: Insufficient documentation

## 2022-06-09 DIAGNOSIS — Z5181 Encounter for therapeutic drug level monitoring: Secondary | ICD-10-CM | POA: Diagnosis present

## 2022-06-09 DIAGNOSIS — I480 Paroxysmal atrial fibrillation: Secondary | ICD-10-CM | POA: Insufficient documentation

## 2022-06-09 LAB — POCT INR: POC INR: 1.7

## 2022-06-09 NOTE — Patient Instructions (Signed)
Description    Take 3 tablets of warfarin today and tomorrow, then continue taking warfarin 2 tablets daily except for 3 tablets on Tuesday and Saturdays. Recheck INR in 3 weeks.  Call [facility] [phone] Main # [phone] with any questions

## 2022-06-09 NOTE — Telephone Encounter (Signed)
Left message for patient to call back  

## 2022-06-09 NOTE — Telephone Encounter (Signed)
Spoke with the patient who states that she has been in AFIB since 9am this morning. She is currently wearing a Zio monitor. She has also checked her rhythm with her Kardia mobile. She states that she always has pain under her breast or in her back when she is in AFIB. She states that she also always gets a bit dizzy as well. She has not taken metoprolol yet today as she takes this at night. She states that she did take her verapamil this morning. She does not have any AFIB medications but she did used to take one tablet of verapamil when she was in afib.

## 2022-06-17 ENCOUNTER — Telehealth: Payer: Self-pay | Admitting: *Deleted

## 2022-06-17 NOTE — Telephone Encounter (Signed)
Spoke with patient and stated she is still feeling tired and wants to sleep though hasn't gotten out of bed yet. She also  mentioned was feeling dizzy and nauseated yesterday she felt due to the heart rates but told her per Renee her heart rates are okay . Marland Kitchen Patient has not token any Toprol and was told per [facility]  and  if she would like she can stay off Toprol and await her monitor results.Patient agreed. Patient hadn't not taken blood pressure at any time during these episodes. Patient was told once she gets up and get herself together to take her blood pressure and will call back to get readings.

## 2022-06-26 ENCOUNTER — Ambulatory Visit: Payer: Medicare Other | Attending: Cardiovascular Disease

## 2022-06-26 DIAGNOSIS — Z5181 Encounter for therapeutic drug level monitoring: Secondary | ICD-10-CM | POA: Diagnosis present

## 2022-06-26 DIAGNOSIS — I480 Paroxysmal atrial fibrillation: Secondary | ICD-10-CM

## 2022-06-26 DIAGNOSIS — I4891 Unspecified atrial fibrillation: Secondary | ICD-10-CM | POA: Diagnosis present

## 2022-06-26 LAB — POCT INR: INR: 1.7 — AB (ref 2.0–3.0)

## 2022-06-26 NOTE — Patient Instructions (Signed)
Description    Take 3 tablets of warfarin today and then START taking warfarin 2 tablets daily except for 3 tablets on Tuesdays, Thursdays, and Saturdays.  Recheck INR in 3 weeks.  Call [facility] [phone]

## 2022-06-29 ENCOUNTER — Ambulatory Visit (HOSPITAL_COMMUNITY): Payer: Medicare Other | Attending: Physician Assistant

## 2022-06-29 DIAGNOSIS — R002 Palpitations: Secondary | ICD-10-CM | POA: Diagnosis not present

## 2022-06-29 DIAGNOSIS — I493 Ventricular premature depolarization: Secondary | ICD-10-CM | POA: Insufficient documentation

## 2022-06-29 DIAGNOSIS — I480 Paroxysmal atrial fibrillation: Secondary | ICD-10-CM | POA: Diagnosis present

## 2022-06-29 DIAGNOSIS — I340 Nonrheumatic mitral (valve) insufficiency: Secondary | ICD-10-CM | POA: Diagnosis not present

## 2022-06-29 LAB — ECHOCARDIOGRAM COMPLETE
Area-P 1/2: 3.48 cm2
S' Lateral: 2.9 cm

## 2022-06-30 ENCOUNTER — Encounter: Payer: Self-pay | Admitting: Podiatry

## 2022-06-30 ENCOUNTER — Ambulatory Visit (INDEPENDENT_AMBULATORY_CARE_PROVIDER_SITE_OTHER): Payer: Medicare Other | Admitting: Podiatry

## 2022-06-30 DIAGNOSIS — E1169 Type 2 diabetes mellitus with other specified complication: Secondary | ICD-10-CM

## 2022-06-30 DIAGNOSIS — M79675 Pain in left toe(s): Secondary | ICD-10-CM | POA: Diagnosis not present

## 2022-06-30 DIAGNOSIS — M79674 Pain in right toe(s): Secondary | ICD-10-CM | POA: Diagnosis not present

## 2022-06-30 DIAGNOSIS — B351 Tinea unguium: Secondary | ICD-10-CM

## 2022-06-30 NOTE — Progress Notes (Signed)
This patient returns to my office for at risk foot care.  This patient requires this care by a professional since this patient will be at risk due to having coagulation defect.  Patient is taking coumadin.  This patient is unable to cut nails herself since the patient cannot reach her nails.These nails are painful walking and wearing shoes.  This patient presents for at risk foot care today.  General Appearance  Alert, conversant and in no acute stress.  Vascular  Dorsalis pedis and posterior tibial  pulses are palpable  bilaterally.  Capillary return is within normal limits  bilaterally. Temperature is within normal limits  bilaterally.  Neurologic  Senn-Weinstein monofilament wire test within normal limits  bilaterally. Muscle power within normal limits bilaterally.  Nails Thick disfigured discolored nails with subungual debris  Second left and first and third right foot.  No evidence of bacterial infection or drainage bilaterally.  Orthopedic  No limitations of motion  feet .  No crepitus or effusions noted.  No bony pathology or digital deformities noted.  Skin  normotropic skin with no porokeratosis noted bilaterally.  No signs of infections or ulcers noted.     Onychomycosis  Pain in right toes  Pain in left toes  Consent was obtained for treatment procedures.   Mechanical debridement of nails 1-5  bilaterally performed with a nail nipper.  Filed with dremel without incident.    Return office visit   9 weeks                  Told patient to return for periodic foot care and evaluation due to potential at risk complications.   Oluwaseyi Tull DPM  

## 2022-07-17 ENCOUNTER — Ambulatory Visit: Payer: Medicare Other | Attending: Cardiovascular Disease | Admitting: *Deleted

## 2022-07-17 DIAGNOSIS — I4891 Unspecified atrial fibrillation: Secondary | ICD-10-CM | POA: Insufficient documentation

## 2022-07-17 DIAGNOSIS — Z01810 Encounter for preprocedural cardiovascular examination: Secondary | ICD-10-CM | POA: Insufficient documentation

## 2022-07-17 DIAGNOSIS — I480 Paroxysmal atrial fibrillation: Secondary | ICD-10-CM | POA: Insufficient documentation

## 2022-07-17 DIAGNOSIS — Z5181 Encounter for therapeutic drug level monitoring: Secondary | ICD-10-CM | POA: Diagnosis present

## 2022-07-17 LAB — POCT INR: INR: 1.9 — AB (ref 2.0–3.0)

## 2022-07-17 NOTE — Patient Instructions (Addendum)
Description   Since you are finishing up prednisone, continue taking warfarin 2 tablets daily except for 3 tablets on Tuesdays, Thursdays, and Saturdays. Recheck INR in 3 weeks.  Call [facility] Clinic [phone] or [phone]

## 2022-08-04 ENCOUNTER — Other Ambulatory Visit: Payer: Self-pay | Admitting: *Deleted

## 2022-08-04 ENCOUNTER — Telehealth: Payer: Self-pay | Admitting: *Deleted

## 2022-08-04 DIAGNOSIS — I480 Paroxysmal atrial fibrillation: Secondary | ICD-10-CM

## 2022-08-04 MED ORDER — WARFARIN SODIUM 2 MG PO TABS
ORAL_TABLET | ORAL | 1 refills | Status: DC
Start: 2022-08-04 — End: 2023-01-25

## 2022-08-04 NOTE — Telephone Encounter (Signed)
Pt called to report she is taking acyclovir for shingles. Advised that med is safe to take with warfarin and she verbalized understanding.

## 2022-08-04 NOTE — Telephone Encounter (Signed)
Warfarin refill Afib Last INR 07/17/22 Last OV3/13/24

## 2022-08-07 ENCOUNTER — Ambulatory Visit: Payer: Medicare Other

## 2022-08-09 NOTE — Progress Notes (Unsigned)
Cardiology Office Note Date:  08/10/2022  Patient ID:  [name], DOB [birth year], MRN [id number] PCP:  Tally Joe, MD  Electrophysiologist: Dr. Lalla Brothers Cardiologist: Dr. Cristal Deer  Chief Complaint:  13mo  History of Present Illness: [name] is a 67 y.o. female with history of RA, HTN, HLD, DVT (recurrent), AFib  She comes in today to be seen for Dr. Johney Frame, last seen by him Nov 2022, she had been feeling some palpitations, kardia tracings without AFib/arrhythmias Planned for labs and updated echo Also mentioned dizziness, c/w Vertigo  She had a hospital stay 03/29/21 - 03/30/21 for CP, neg trops, nonischemic EKG, Kardia tracings without arrhythmia.  Coronary CT with minimal CAD, ca++ 68  I saw her 05/12/21 She discusses a few symptoms: Known Vertigo, She has seen her PMD and neurologist has completed therapy with improvement in this spinning sensation, typically provoked by turning her head left. She has also seen ENT who felt her ears were OK though had signs of GERD on her throat and her Prilosec doubled, with improvement in her GERD She has seen GI who continued the same Prilosec and plans for colonoscopy w/hx of polyps and diverticulitis She has had trouble with recurrent sinus infections Postural lightheadedness, this dates back quite a while, with no real change in the bahvior of thie symptom, she has not had syncope, will sometimes need to sit, put her head down to resolve her lightheadedness. Palpitations, these seemed to flare up intermittently and keeps track via her Lourena Simmonds, some time ago she self increased her verapamil back to 160mg  BID and this has settled these pretty well, and did NOT worsen her postural symptom. CP.  This continues to be a problem. She has no CP at rest or with ADLs, but will get CP with heavier activities like scrubbing the floor, heavy vacuuming, is it central/left of sternum and radiates to between her shoulder blades, it will resolve  with rest and or s/l NTG. She does not percive palpitations or seen Afib with her CP Since home from her Jan hospital stay has used NTG perhaps 6 times. Imdur was added to her regime She reported statin intolerance, planned to refer to general cardiology as well as Dr. Lalla Brothers for new EP MD  She saw Dr. Lalla Brothers 09/09/21, doing well, no changes were made  She has seen cardiology team a few times, most recently Dr. Cristal Deer Oct 2023, again doing well, was post knee surgery, working on advancing activities, no changes were made.  I saw her 06/03/22 She has had an uptick in symptoms, particularly her palpitations. 2/13/ had about 4 hours of AFib (confirmed to her by [place] tracing) and recognized symptom, gets an ache across her back when in AFib 3 days ago had some pounding and caught about 8 seconds of V. Trigeminy (she is a retired [employer] fund[employer]) Last night had chest discomfort stabbing/pounding starts front rad to back, can last hours, took her PMD meds and some time later stopped, kardia noted NSR This AM on the way here had some chest discomfort again, caught one PVC on her kadia. Eased off here This CP/chest symptom that she has, goes back/pre-dates her coronary CT last year with no significant CAD and neg Trops.  This is reassuring. It does not seem especially connected to rhythm. I discussed she consider talking with her PMD for possible non-cardiac etiologies as well. She has some orthostatic dizziness Her symptoms, palpitations are anxiety provoking, she gets a little emotional today in discussing that  historically not taken seriously. No bleeding or signs of bleeding  Planned for labs, monitor and echo, started on Toprol Echo looked OK, preserved LVEF, monitor as below  Several mychart messages, discussion on symptoms, Afib, perhaps intolerant of Toprol, though HRs seemed OK Took extra verapamil   TODAY She continues to have symptomatic AFib A couple weeks ago at church went into AFib,  rates were OK, 57-69 or so, but makes her feel weak, lightheaded, lasted several hours, had to just go home and lay in bed. Her Afib had been well controlled until about 6-8 mo ago, started to flare back up No real trigger that she has noted. When not in AFib she feels well Metoprolol seemed to bother her asthma and generally made her feel unwell even when in SR.   AFib hx AFib ablation at [facility] 2010, Dr. Sharlene Dory PVI and CTI ablation 05/07/2016  AAD Hx Flecainide dose limited with development of BBB > failed to maintain SR and stopped Dec 2017 Multaq stopped post PVI ablation 2018, notes also report poorly tolerated (unclear), she does not recall ever taking it Metoprolol succ bothered her asthma, made her feel unwell  Past Medical History:  Diagnosis Date   Allergic rhinitis    Anginal pain (HCC)    Arthritis    "back" (05/07/2016)   Asthma    Atrial flutter (HCC)    a. Remotely per notes.   Carpal tunnel syndrome, bilateral    Colon polyps    Diverticulitis    Dizziness    DVT (deep venous thrombosis) (HCC) 1980s x2 -    between birth of two sons. Saw hematology - was told she has a hypercoagulable disorder, should be on Coumadin lifelong.   Dysrhythmia    atrial fib   Family history of adverse reaction to anesthesia    "mother gets PONV"   Gallbladder disease    GERD (gastroesophageal reflux disease)    GIB (gastrointestinal bleeding) 04/14/2018   Halothane adverse reaction    narrow small opening   Heart murmur    Hiatal hernia    History of hiatal hernia    "small one/CTA 05/01/2016" (05/07/2016)   History of kidney stones    HTN (hypertension)    Hypercholesteremia    hx; "brought it down w/diet" (05/07/2016)   Hyperglycemia    a. A1c 6.1 in 2014.   Inguinal hernia    Iron deficiency anemia due to chronic blood loss 04/14/2018   Iron malabsorption 04/14/2018   Left sided sciatica    Normal cardiac stress test 06/2012   Obesity    OSA (obstructive sleep  apnea)    a. Mild, did not tolerate CPAP (05/07/2016)   PAF (paroxysmal atrial fibrillation) (HCC)    a. s/p afib ablation at [facility] 2010 (Dr. Sharlene Dory). b. Recurrent AF s/p DCCV 06/2010. c. On flecainide.    Paroxysmal atrial fibrillation (HCC) 09/09/2009   Qualifier: Diagnosis of  By: Susette Racer CMA, Jewel     PONV (postoperative nausea and vomiting)    PPD positive, treated 1977   "treated for 1 yr after exposure to patient"   Pre-diabetes    RSV infection ~ 2006   Vertigo    Wandering (atrial) pacemaker    a. Remotely per notes.  (  pt. states has a wandering p wave ) no pacemaker    Past Surgical History:  Procedure Laterality Date   ANKLE ARTHODESIS W/ ARTHROSCOPY Right 11/12/2020   ATRIAL ABLATION SURGERY  05/07/2016   "fib  and flutter"   ATRIAL FIBRILLATION ABLATION  2010   at [facility]   ATRIAL FIBRILLATION ABLATION N/A 05/07/2016   Procedure: Atrial Fibrillation Ablation;  Surgeon: Hillis Range, MD;  Location: [facility] INVASIVE CV LAB;  Service: Cardiovascular;  Laterality: N/A;   BREAST CYST ASPIRATION Bilateral    CARDIAC CATHETERIZATION  2008   CARPAL TUNNEL RELEASE Bilateral    CATARACT EXTRACTION Bilateral 2022   CESAREAN SECTION  1986; 1988   COLON RESECTION     COLONOSCOPY  X 2   COLONOSCOPY W/ BIOPSIES AND POLYPECTOMY  X 2   CYSTOSCOPY/URETEROSCOPY/HOLMIUM LASER/STENT PLACEMENT Left 12/23/2019   Procedure: CYSTOSCOPY/URETEROSCOPY/HOLMIUM LASER/STENT PLACEMENT;  Surgeon: Noel Christmas, MD;  Location: [facility];  Service: Urology;  Laterality: Left;   ESOPHAGOGASTRODUODENOSCOPY     ESOPHAGOGASTRODUODENOSCOPY (EGD) WITH PROPOFOL N/A 07/19/2017   Procedure: ESOPHAGOGASTRODUODENOSCOPY (EGD) WITH PROPOFOL;  Surgeon: Benancio Deeds, MD;  Location: [facility];  Service: Gastroenterology;  Laterality: N/A;   INGUINAL HERNIA REPAIR Right    KNEE ARTHROSCOPY Right 06/10/2021   Procedure: RIGHT KNEE ARTHROSCOPY;  Surgeon: Marcene Corning, MD;  Location: [facility];   Service: Orthopedics;  Laterality: Right;   LAPAROSCOPIC CHOLECYSTECTOMY  1989   POLYPECTOMY N/A 07/19/2017   Procedure: POLYPECTOMY;  Surgeon: Benancio Deeds, MD;  Location: [facility];  Service: Gastroenterology;  Laterality: N/A;   PROCTOSCOPY N/A 08/06/2017   Procedure: PROCTOSCOPY;  Surgeon: Karie Soda, MD;  Location: [facility];  Service: General;  Laterality: N/A;   TMJ ARTHROPLASTY     TOTAL KNEE ARTHROPLASTY Right 01/06/2022   Procedure: RIGHT TOTAL KNEE ARTHROPLASTY;  Surgeon: Marcene Corning, MD;  Location: [facility];  Service: Orthopedics;  Laterality: Right;   TUBAL LIGATION  1988    Current Outpatient Medications  Medication Sig Dispense Refill   acetaminophen (TYLENOL) 650 MG CR tablet Take 1,300 mg by mouth every 8 (eight) hours as needed for pain.      APPLE CIDER VINEGAR PO Take 450 mg by mouth in the morning.     cetirizine (ZYRTEC) 10 MG tablet Take 10 mg by mouth in the morning.     Cholecalciferol (VITAMIN D3 PO) Take 2,000 Units by mouth in the morning.     Continuous Blood Gluc Sensor (FREESTYLE LIBRE 2 SENSOR) MISC APPLY ONE SENSOR TO THE BACK OF THE UPPER ARM EVERY 14 DAYS     dicyclomine (BENTYL) 10 MG capsule TAKE 1 CAPSULE BY MOUTH 30 MINS BEFORE MEALS AND AT BEDTIME AS NEEDED FOR PAIN/CRAMPING 360 capsule 1   ezetimibe (ZETIA) 10 MG tablet TAKE 1 TABLET BY MOUTH EVERY DAY 90 tablet 0   FLOVENT HFA 220 MCG/ACT inhaler Inhale 1 puff into the lungs 2 (two) times daily.  1   fluticasone (FLONASE) 50 MCG/ACT nasal spray Place 1 spray into both nostrils 2 (two) times daily.      isosorbide mononitrate (IMDUR) 30 MG 24 hr tablet TAKE 1 TABLET BY MOUTH EVERY DAY (Patient taking differently: Take 30 mg by mouth every evening.) 90 tablet 3   losartan (COZAAR) 25 MG tablet Take 1 tablet (25 mg total) by mouth daily. 90 tablet 0   meclizine (ANTIVERT) 25 MG tablet Take 1 tablet (25 mg total) by mouth 3 (three) times daily as needed for dizziness. 30 tablet 0   Multiple  Vitamin (MULTIVITAMIN WITH MINERALS) TABS tablet Take 1 tablet by mouth in the morning.     nitroGLYCERIN (NITROSTAT) 0.4 MG SL tablet PLACE 1 TABLET UNDER THE TONGUE EVERY 5 MINUTES  AS NEEDED. 25 tablet 11   omeprazole (PRILOSEC) 40 MG capsule TAKE 1 CAPSULE BY MOUTH DAILY, CAN INCREASE TO 2 TIMES DAILY IF NEEDED 180 capsule 0   OZEMPIC, 0.25 OR 0.5 MG/DOSE, 2 MG/3ML SOPN Inject 1 mg into the skin once a week.     polyethylene glycol (MIRALAX) 17 g packet Take 17 g by mouth 2 (two) times daily. Titrate as needed (Patient taking differently: Take 17 g by mouth 2 (two) times daily as needed (constipation.).) 14 each 0   PROAIR HFA 108 (90 BASE) MCG/ACT inhaler Inhale 2 puffs into the lungs every 6 (six) hours as needed for wheezing or shortness of breath.      promethazine (PHENERGAN) 12.5 MG tablet Take 12.5-25 mg by mouth every 6 (six) hours as needed.     sucralfate (CARAFATE) 1 g tablet Take 1 tablet before meals up to 3 times daily. (Patient taking differently: Take 1 g by mouth 3 (three) times daily as needed (stomach issues).)     venlafaxine XR (EFFEXOR-XR) 37.5 MG 24 hr capsule Take 37.5 mg by mouth daily with breakfast.      verapamil (CALAN) 80 MG tablet Take 160 mg by mouth 2 (two) times daily.     warfarin (COUMADIN) 2 MG tablet TAKE 4MG  DAILY EXCEPT 6MG  ON TUESDAYS, THURSDAYS,AND SATURDAYS AS DIRECTED BY ANTICOAGULATION CLINIC 225 tablet 1   No current facility-administered medications for this visit.    Allergies:   Aspirin, Banana, Food, Iodinated contrast media, Latex, Meperidine hcl, Peanut-containing drug products, Penicillins, Statins, Sulfonamide derivatives, Tape, Alfuzosin, Halothane, Other, and Sulfamethoxazole   Social History:  The patient  reports that she has never smoked. She has been exposed to tobacco smoke. She has never used smokeless tobacco. She reports that she does not currently use alcohol. She reports that she does not use drugs.   Family History:  The  patient's family history includes Colon polyps in her father; Diabetes in her maternal grandmother; Heart attack in her maternal grandmother, mother, and paternal grandfather; Hyperlipidemia in her father and maternal grandmother; Hypertension in her maternal grandfather, maternal grandmother, and mother; Lupus in her sister; Stroke in her maternal grandfather, mother, and paternal grandmother; Thyroid disease in her mother and sister.  ROS:  Please see the history of present illness.    All other systems are reviewed and otherwise negative.   PHYSICAL EXAM:  VS:  BP (!) 146/80   Pulse 70   Ht 5\' 4"  (1.626 m)   Wt 210 lb (95.3 kg)   SpO2 95%   BMI 36.05 kg/m  BMI: Body mass index is 36.05 kg/m. Well nourished, well developed, in no acute distress HEENT: normocephalic, atraumatic Neck: no JVD, carotid bruits or masses Cardiac: RRR; no significant murmurs, no rubs, or gallops Lungs:  CTA b/l, no wheezing, rhonchi or rales Abd: soft, nontender MS: no deformity or atrophy Ext: no edema Skin: warm and dry, no rash Neuro:  No gross deficits appreciated Psych: euthymic mood, full affect   EKG:  not done today   March 2024: monitor HR 46 - 143 bpm, average 69 bpm. 12 nonsustained SVT, longest 11 beats. 3% burden of atrial fibrillation, average rate 92 bpm. Longest epiosode 5hours Symptom trigger episodes correspond to AF. Rare supraventricular and ventricular ectopy.  07/19/22: TTE 1. Left ventricular ejection fraction, by estimation, is 60 to 65%. The  left ventricle has normal function. The left ventricle has no regional  wall motion abnormalities. Left ventricular diastolic parameters are  indeterminate.   2. Right ventricular systolic function is normal. The right ventricular  size is normal. There is normal pulmonary artery systolic pressure. The  estimated right ventricular systolic pressure is 27.0 mmHg.   3. The mitral valve is normal in structure. Mild mitral  valve  regurgitation. No evidence of mitral stenosis.   4. The aortic valve is tricuspid. Aortic valve regurgitation is not  visualized. No aortic stenosis is present.   5. The inferior vena cava is normal in size with greater than 50%  respiratory variability, suggesting right atrial pressure of 3 mmHg.     04/09/21: Coronary CT IMPRESSION: 1. Coronary calcium score of 68. This was 41 percentile for age and sex matched control.   2. Normal coronary origin with right dominance.   3. CAD-RADS 2. Mild non-obstructive CAD (25-49%). Consider non-atherosclerotic causes of chest pain. Consider preventive therapy and risk factor modification.   03/14/21: TTE  1. Left ventricular ejection fraction, by estimation, is 60 to 65%. The  left ventricle has normal function. The left ventricle has no regional  wall motion abnormalities. Left ventricular diastolic parameters are  consistent with Grade II diastolic  dysfunction (pseudonormalization). Elevated left ventricular end-diastolic  pressure.   2. Right ventricular systolic function is normal. The right ventricular  size is normal.   3. Left atrial size was mild to moderately dilated.   4. The mitral valve is grossly normal. Mild mitral valve regurgitation.  No evidence of mitral stenosis.   5. The aortic valve is grossly normal. Aortic valve regurgitation is not  visualized. No aortic stenosis is present.   6. The inferior vena cava is normal in size with <50% respiratory  variability, suggesting right atrial pressure of 8 mmHg.   Comparison(s): No significant change from prior study.   05/07/2016; EPS/ablation CONCLUSIONS: 1. Sinus rhythm upon presentation.   2. Intracardiac echo reveals a moderate sized left atrium with four separate pulmonary veins without evidence of pulmonary vein stenosis. 3. Ep study demonstrated electrical activity within the right superior and inferior pulmonary veins at baseline.  There was exit conduction  through the left inferior pulmonary vein.  The left superior pulmonary vein was quiescent from the prior ablation procedure.  4. Successful sequential electrical isolation and anatomical encircling of the left inferior, right superior and right inferior pulmonary veins.  A WACA approach was used along the right PVs.  The left inferior pulmonary vein was isolated along the anterior and inferior quadrant.    Extensive ablation was required along the carina between the RSPV and RIPV. 5. Cavo-tricuspid isthmus ablation was performed with complete bidirectional isthmus block achieved.  6. No inducible arrhythmias following ablation both on and off of Isuprel 7. No early apparent complications.  Recent Labs: 06/03/2022: BUN 12; Creatinine, Ser 0.69; Hemoglobin 14.0; Magnesium 2.1; Platelets 360; Potassium 4.3; Sodium 139  No results found for requested labs within last 365 days.   CrCl cannot be calculated (Patient's most recent lab result is older than the maximum 21 days allowed.).   Wt Readings from Last 3 Encounters:  08/10/22 210 lb (95.3 kg)  06/03/22 212 lb 6.4 oz (96.3 kg)  03/06/22 217 lb (98.4 kg)     Other studies reviewed: Additional studies/records reviewed today include: summarized above  ASSESSMENT AND PLAN:  Paroxysmal Afib CHA2DS2Vasc is 5, on warfarin,  monitored and managed with [place]  Very symptomatic with her AFib Discussed AAD options, Tikosyn/amiodarone, she is very reluctant She would like to d/w Dr. Lalla Brothers if  he thought she would be a 3rd ablation candidate, discussed perhaps [facility] referral, she would also be open to that discussion  She has been previously advised by Dr. Johney Frame to take an extra verapamil dose with persistent AFib episodes, this strategy has worked for the most part and will continue this for now  HTN Looks ok 3. Postural lightheadedness chronic and unchanged No syncope  I have asked her to monitor her BP  4. CP CTA with no obstructive  CAD though exertional angina, not unstable Chronic sounding, predates her coronary CT C/w cardiology team Not an active complaint today  5. She has observed PVCs Rare on her monitor  6. Secondary hypercoagulable state    Disposition: will have her see Dr. Lalla Brothers to discuss what if any procedural options she may have   Current medicines are reviewed at length with the patient today.  The patient did not have any concerns regarding medicines.  Norma Fredrickson, PA-C 08/10/2022 2:52 PM     [facility] [address] [place] [address] [phone] (office)  [phone] (fax)

## 2022-08-10 ENCOUNTER — Ambulatory Visit: Payer: Medicare Other | Attending: Physician Assistant | Admitting: Physician Assistant

## 2022-08-10 ENCOUNTER — Encounter: Payer: Self-pay | Admitting: Physician Assistant

## 2022-08-10 VITALS — BP 146/80 | HR 70 | Ht 64.0 in | Wt 210.0 lb

## 2022-08-10 DIAGNOSIS — I493 Ventricular premature depolarization: Secondary | ICD-10-CM

## 2022-08-10 DIAGNOSIS — D6869 Other thrombophilia: Secondary | ICD-10-CM | POA: Diagnosis present

## 2022-08-10 DIAGNOSIS — I10 Essential (primary) hypertension: Secondary | ICD-10-CM

## 2022-08-10 DIAGNOSIS — I480 Paroxysmal atrial fibrillation: Secondary | ICD-10-CM | POA: Diagnosis present

## 2022-08-10 NOTE — Patient Instructions (Addendum)
Medication Instructions:   Your physician recommends that you continue on your current medications as directed. Please refer to the Current Medication list given to you today.  *If you need a refill on your cardiac medications before your next appointment, please call your pharmacy*   Lab Work: NONE ORDERED  TODAY   If you have labs (blood work) drawn today and your tests are completely normal, you will receive your results only by: MyChart Message (if you have MyChart) OR A paper copy in the mail If you have any lab test that is abnormal or we need to change your treatment, we will call you to review the results.   Testing/Procedures:  NONE ORDERED  TODAY    Follow-Up: At [facility], you and your health needs are our priority.  As part of our continuing mission to provide you with exceptional heart care, we have created designated Provider Care Teams.  These Care Teams include your primary Cardiologist (physician) and Advanced Practice Providers (APPs -  Physician Assistants and Nurse Practitioners) who all work together to provide you with the care you need, when you need it.  We recommend signing up for the patient portal called "MyChart".  Sign up information is provided on this After Visit Summary.  MyChart is used to connect with patients for Virtual Visits (Telemedicine).  Patients are able to view lab/test results, encounter notes, upcoming appointments, etc.  Non-urgent messages can be sent to your provider as well.   To learn more about what you can do with MyChart, go to [email or web address].    Your next appointment:  SOME WILL CONTACT YOU BACK FOR FOLLOW UP   Provider:  IN ONE MONTH   You may see Lanier Prude, MD   Other Instructions

## 2022-08-12 ENCOUNTER — Ambulatory Visit: Payer: Medicare Other | Attending: Cardiovascular Disease

## 2022-08-12 DIAGNOSIS — I4891 Unspecified atrial fibrillation: Secondary | ICD-10-CM | POA: Diagnosis present

## 2022-08-12 DIAGNOSIS — Z5181 Encounter for therapeutic drug level monitoring: Secondary | ICD-10-CM | POA: Diagnosis present

## 2022-08-12 DIAGNOSIS — I480 Paroxysmal atrial fibrillation: Secondary | ICD-10-CM | POA: Diagnosis present

## 2022-08-12 DIAGNOSIS — Z01810 Encounter for preprocedural cardiovascular examination: Secondary | ICD-10-CM

## 2022-08-12 LAB — POCT INR: INR: 2.5 (ref 2.0–3.0)

## 2022-08-12 NOTE — Patient Instructions (Signed)
Description   Continue taking warfarin 2 tablets daily except for 3 tablets on Tuesdays, Thursdays, and Saturdays. Recheck INR in 4 weeks.  Call Coumadin Clinic [phone] or [phone]

## 2022-08-27 NOTE — Progress Notes (Signed)
Electrophysiology Office Follow up Visit Note:    Date:  08/28/2022   ID:  [name], DOB Jan 21, 1956, MRN [id number]  PCP:  Tally Joe, MD  [facility] Cardiologist:  Jodelle Red, MD  [facility] Electrophysiologist:  Lanier Prude, MD    Interval History:    [name] is a 66 y.o. female who presents for a follow up visit.   Last seen 08/10/2022 by Luster Landsberg. I saw her 09/09/2021.   Last ablation 05/07/2016 by Dr Johney Frame. During that ablation, there was residual electrical activity within the RSPV and RIPV. There was also conduction out of the LIPV. The posterior wall was not targeted. The CTI was also ablated during the 04/2016 ablation.  At the appointment with Luster Landsberg she reported more AF episodes. She feels poorly while in AF.    AFib hx AFib ablation at [facility] 2010, Dr. Sharlene Dory PVI and CTI ablation 05/07/2016   AAD Hx Flecainide dose limited with development of BBB > failed to maintain SR and stopped Dec 2017 Multaq stopped post PVI ablation 2018, notes also report poorly tolerated (unclear), she does not recall ever taking it Metoprolol succ bothered her asthma, made her feel unwell    Today she reports an episode of atrial fibrillation occurring every 2 to 4 weeks.  They last between 5 and 24 hours and are highly symptomatic.      Past medical, surgical, social and family history were reviewed.  ROS:   Please see the history of present illness.    All other systems reviewed and are negative.  EKGs/Labs/Other Studies Reviewed:    The following studies were reviewed today:  EKG:  The ekg ordered today demonstrates sinus rhythm.  QRS duration 84.  QTc is 433 ms.   Physical Exam:    VS:  BP 134/80   Pulse 63   Ht 5\' 4"  (1.626 m)   Wt 204 lb (92.5 kg)   SpO2 95%   BMI 35.02 kg/m     Wt Readings from Last 3 Encounters:  08/28/22 204 lb (92.5 kg)  08/10/22 210 lb (95.3 kg)  06/03/22 212 lb 6.4 oz (96.3 kg)     GEN:   Well nourished, well developed in no acute distress CARDIAC: RRR, no murmurs, rubs, gallops RESPIRATORY:  Clear to auscultation without rales, wheezing or rhonchi       ASSESSMENT:    1. Persistent atrial fibrillation (HCC)   2. Primary hypertension    PLAN:    In order of problems listed above:  #Persistent AF S/p prior ablations as above.  On warfarin for stroke ppx. We discussed options during today's visit including convergent ablation, redo catheter ablation and alternative antiarrhythmic medications.  Discussed treatment options today for AF including antiarrhythmic drug therapy and ablation. Discussed risks, recovery and likelihood of success with each treatment strategy. Risk, benefits, and alternatives to EP study and radiofrequency ablation for afib were discussed. These risks include but are not limited to stroke, bleeding, vascular damage, tamponade, perforation, damage to the esophagus, lungs, phrenic nerve and other structures, pulmonary vein stenosis, worsening renal function, and death.  Discussed potential need for repeat ablation procedures and antiarrhythmic drugs after an initial ablation. The patient understands these risk and wishes to proceed.  We will therefore proceed with catheter ablation at the next available time.  Carto, ICE, anesthesia are requested for the procedure.  Will also obtain CT PV protocol prior to the procedure to exclude LAA thrombus and further evaluate atrial  anatomy.  #Hypertension At goal today.  Recommend checking blood pressures 1-2 times per week at home and recording the values.  Recommend bringing these recordings to the primary care physician.     Signed, Steffanie Dunn, MD, [facility], [facility] 08/28/2022 5:00 PM    Electrophysiology [facility]

## 2022-08-28 ENCOUNTER — Ambulatory Visit: Payer: Medicare Other | Attending: Cardiology | Admitting: Cardiology

## 2022-08-28 ENCOUNTER — Encounter: Payer: Self-pay | Admitting: Cardiology

## 2022-08-28 VITALS — BP 134/80 | HR 63 | Ht 64.0 in | Wt 204.0 lb

## 2022-08-28 DIAGNOSIS — I10 Essential (primary) hypertension: Secondary | ICD-10-CM | POA: Diagnosis present

## 2022-08-28 DIAGNOSIS — I4819 Other persistent atrial fibrillation: Secondary | ICD-10-CM | POA: Insufficient documentation

## 2022-08-28 NOTE — Patient Instructions (Addendum)
Medication Instructions:  Your physician recommends that you continue on your current medications as directed. Please refer to the Current Medication list given to you today.  *If you need a refill on your cardiac medications before your next appointment, please call your pharmacy*  Lab Work: BMET and CBC on 9/13 - walk-in anytime between 7:30am and 4:30pm  Testing/Procedures: Your physician has requested that you have cardiac CT. Cardiac computed tomography (CT) is a painless test that uses an x-ray machine to take clear, detailed pictures of your heart. For further information please visit [email or web address] We will call you to schedule your CT scan, it will be done about one week prior to your ablation.  Your physician has recommended that you have an ablation. Catheter ablation is a medical procedure used to treat some cardiac arrhythmias (irregular heartbeats). During catheter ablation, a long, thin, flexible tube is put into a blood vessel in your groin (upper thigh), or neck. This tube is called an ablation catheter. It is then guided to your heart through the blood vessel. Radio frequency waves destroy small areas of heart tissue where abnormal heartbeats may cause an arrhythmia to start.  You are scheduled for Atrial Fibrillation Ablation on Friday, September 27 with Dr. Steffanie Dunn.Please arrive at the Main Entrance A at [facility]: [address] [place], [place] [address] at 5:30 AM    Follow-Up: At [facility], you and your health needs are our priority.  As part of our continuing mission to provide you with exceptional heart care, we have created designated Provider Care Teams.  These Care Teams include your primary Cardiologist (physician) and Advanced Practice Providers (APPs -  Physician Assistants and Nurse Practitioners) who all work together to provide you with the care you need, when you need it.  Your next appointment:   We will call you to arrange  your follow up appointment

## 2022-09-01 ENCOUNTER — Ambulatory Visit (INDEPENDENT_AMBULATORY_CARE_PROVIDER_SITE_OTHER): Payer: Medicare Other | Admitting: Podiatry

## 2022-09-01 ENCOUNTER — Encounter: Payer: Self-pay | Admitting: Podiatry

## 2022-09-01 DIAGNOSIS — B351 Tinea unguium: Secondary | ICD-10-CM

## 2022-09-01 DIAGNOSIS — M79674 Pain in right toe(s): Secondary | ICD-10-CM | POA: Diagnosis not present

## 2022-09-01 DIAGNOSIS — E1169 Type 2 diabetes mellitus with other specified complication: Secondary | ICD-10-CM | POA: Diagnosis not present

## 2022-09-01 DIAGNOSIS — M79675 Pain in left toe(s): Secondary | ICD-10-CM | POA: Diagnosis not present

## 2022-09-01 NOTE — Progress Notes (Signed)
This patient returns to my office for at risk foot care.  This patient requires this care by a professional since this patient will be at risk due to having coagulation defect.  Patient is taking coumadin.  This patient is unable to cut nails herself since the patient cannot reach her nails.These nails are painful walking and wearing shoes.  This patient presents for at risk foot care today.  General Appearance  Alert, conversant and in no acute stress.  Vascular  Dorsalis pedis and posterior tibial  pulses are palpable  bilaterally.  Capillary return is within normal limits  bilaterally. Temperature is within normal limits  bilaterally.  Neurologic  Senn-Weinstein monofilament wire test within normal limits  bilaterally. Muscle power within normal limits bilaterally.  Nails Thick disfigured discolored nails with subungual debris  Second left and first and third right foot.  No evidence of bacterial infection or drainage bilaterally.  Orthopedic  No limitations of motion  feet .  No crepitus or effusions noted.  No bony pathology or digital deformities noted.  Skin  normotropic skin with no porokeratosis noted bilaterally.  No signs of infections or ulcers noted.     Onychomycosis  Pain in right toes  Pain in left toes  Consent was obtained for treatment procedures.   Mechanical debridement of nails 1-5  bilaterally performed with a nail nipper.  Filed with dremel without incident.    Return office visit   9 weeks                  Told patient to return for periodic foot care and evaluation due to potential at risk complications.   Oakley Orban DPM  

## 2022-09-09 ENCOUNTER — Ambulatory Visit: Payer: Medicare Other | Attending: Internal Medicine

## 2022-09-09 DIAGNOSIS — Z5181 Encounter for therapeutic drug level monitoring: Secondary | ICD-10-CM | POA: Diagnosis present

## 2022-09-09 DIAGNOSIS — I480 Paroxysmal atrial fibrillation: Secondary | ICD-10-CM | POA: Diagnosis present

## 2022-09-09 DIAGNOSIS — I4891 Unspecified atrial fibrillation: Secondary | ICD-10-CM

## 2022-09-09 LAB — POCT INR: INR: 1.9 — AB (ref 2.0–3.0)

## 2022-09-09 NOTE — Patient Instructions (Signed)
Description   Take 3 tablets today and then START taking warfarin 2 tablets daily except for 3 tablets on Sundays, Tuesdays, Thursdays, and Saturdays.  Recheck INR in 3 weeks.  Call Coumadin Clinic [phone]

## 2022-09-26 ENCOUNTER — Other Ambulatory Visit: Payer: Self-pay | Admitting: Cardiology

## 2022-09-26 ENCOUNTER — Other Ambulatory Visit: Payer: Self-pay | Admitting: Gastroenterology

## 2022-09-30 ENCOUNTER — Ambulatory Visit: Payer: Medicare Other | Attending: Cardiology | Admitting: *Deleted

## 2022-09-30 DIAGNOSIS — Z5181 Encounter for therapeutic drug level monitoring: Secondary | ICD-10-CM | POA: Insufficient documentation

## 2022-09-30 DIAGNOSIS — I480 Paroxysmal atrial fibrillation: Secondary | ICD-10-CM | POA: Insufficient documentation

## 2022-09-30 DIAGNOSIS — I4891 Unspecified atrial fibrillation: Secondary | ICD-10-CM | POA: Insufficient documentation

## 2022-09-30 LAB — POCT INR: POC INR: 2.4

## 2022-09-30 NOTE — Patient Instructions (Signed)
Description   Continue taking warfarin 2 tablets daily except for 3 tablets on Sundays, Tuesdays, Thursdays, and Saturdays.  Recheck INR in 4 weeks.  Call Coumadin Clinic [phone]

## 2022-10-08 ENCOUNTER — Other Ambulatory Visit: Payer: Self-pay | Admitting: Internal Medicine

## 2022-10-23 ENCOUNTER — Other Ambulatory Visit: Payer: Self-pay

## 2022-10-23 MED ORDER — PREDNISONE 50 MG PO TABS: ORAL_TABLET | ORAL | 0 refills | Status: DC

## 2022-10-28 ENCOUNTER — Ambulatory Visit: Payer: Medicare Other | Attending: Cardiology

## 2022-10-28 DIAGNOSIS — Z5181 Encounter for therapeutic drug level monitoring: Secondary | ICD-10-CM | POA: Diagnosis not present

## 2022-10-28 DIAGNOSIS — I480 Paroxysmal atrial fibrillation: Secondary | ICD-10-CM

## 2022-10-28 DIAGNOSIS — I4891 Unspecified atrial fibrillation: Secondary | ICD-10-CM

## 2022-10-28 LAB — POCT INR: INR: 2.6 (ref 2.0–3.0)

## 2022-10-28 NOTE — Patient Instructions (Signed)
Description   Continue taking warfarin 2 tablets daily except for 3 tablets on Sundays, Tuesdays, Thursdays, and Saturdays.  Recheck INR in 4 weeks.  Call Coumadin Clinic [phone]

## 2022-11-03 ENCOUNTER — Ambulatory Visit: Payer: Medicare Other | Admitting: Podiatry

## 2022-11-03 ENCOUNTER — Encounter: Payer: Self-pay | Admitting: Podiatry

## 2022-11-03 DIAGNOSIS — M79675 Pain in left toe(s): Secondary | ICD-10-CM | POA: Diagnosis not present

## 2022-11-03 DIAGNOSIS — B351 Tinea unguium: Secondary | ICD-10-CM

## 2022-11-03 DIAGNOSIS — M79674 Pain in right toe(s): Secondary | ICD-10-CM

## 2022-11-03 DIAGNOSIS — E1169 Type 2 diabetes mellitus with other specified complication: Secondary | ICD-10-CM

## 2022-11-03 NOTE — Progress Notes (Signed)
This patient returns to my office for at risk foot care.  This patient requires this care by a professional since this patient will be at risk due to having coagulation defect.  Patient is taking coumadin.  This patient is unable to cut nails herself since the patient cannot reach her nails.These nails are painful walking and wearing shoes.  This patient presents for at risk foot care today.  General Appearance  Alert, conversant and in no acute stress.  Vascular  Dorsalis pedis and posterior tibial  pulses are palpable  bilaterally.  Capillary return is within normal limits  bilaterally. Temperature is within normal limits  bilaterally.  Neurologic  Senn-Weinstein monofilament wire test within normal limits  bilaterally. Muscle power within normal limits bilaterally.  Nails Thick disfigured discolored nails with subungual debris  Second left and first and third right foot.  No evidence of bacterial infection or drainage bilaterally.  Orthopedic  No limitations of motion  feet .  No crepitus or effusions noted.  No bony pathology or digital deformities noted.  Skin  normotropic skin with no porokeratosis noted bilaterally.  No signs of infections or ulcers noted.     Onychomycosis  Pain in right toes  Pain in left toes  Consent was obtained for treatment procedures.   Mechanical debridement of nails 1-5  bilaterally performed with a nail nipper.  Filed with dremel without incident.    Return office visit   9 weeks                  Told patient to return for periodic foot care and evaluation due to potential at risk complications.   Gregory Mayer DPM  

## 2022-11-25 ENCOUNTER — Ambulatory Visit: Payer: Medicare Other | Attending: Cardiovascular Disease | Admitting: *Deleted

## 2022-11-25 DIAGNOSIS — I480 Paroxysmal atrial fibrillation: Secondary | ICD-10-CM | POA: Insufficient documentation

## 2022-11-25 DIAGNOSIS — Z5181 Encounter for therapeutic drug level monitoring: Secondary | ICD-10-CM | POA: Diagnosis not present

## 2022-11-25 DIAGNOSIS — I4891 Unspecified atrial fibrillation: Secondary | ICD-10-CM | POA: Insufficient documentation

## 2022-11-25 DIAGNOSIS — Z01810 Encounter for preprocedural cardiovascular examination: Secondary | ICD-10-CM | POA: Insufficient documentation

## 2022-11-25 LAB — POCT INR: INR: 3.7 — AB (ref 2.0–3.0)

## 2022-11-25 NOTE — Patient Instructions (Signed)
Description   Today take 1 tablet then continue taking warfarin 2 tablets daily except for 3 tablets on Sundays, Tuesdays, Thursdays, and Saturdays.  Recheck INR in 1 week pending ablation.  Call Coumadin Clinic [phone]

## 2022-12-02 ENCOUNTER — Ambulatory Visit: Payer: Medicare Other | Attending: Internal Medicine | Admitting: *Deleted

## 2022-12-02 DIAGNOSIS — Z5181 Encounter for therapeutic drug level monitoring: Secondary | ICD-10-CM | POA: Diagnosis present

## 2022-12-02 DIAGNOSIS — I480 Paroxysmal atrial fibrillation: Secondary | ICD-10-CM | POA: Diagnosis present

## 2022-12-02 DIAGNOSIS — I4891 Unspecified atrial fibrillation: Secondary | ICD-10-CM | POA: Diagnosis present

## 2022-12-02 LAB — POCT INR: INR: 2.9 (ref 2.0–3.0)

## 2022-12-02 NOTE — Patient Instructions (Signed)
Description   Continue taking warfarin 2 tablets daily except for 3 tablets on Sundays, Tuesdays, Thursdays, and Saturdays. Recheck INR in 1 week pending ablation.  Call Coumadin Clinic [phone]

## 2022-12-04 ENCOUNTER — Ambulatory Visit: Payer: Medicare Other | Attending: Family Medicine

## 2022-12-04 DIAGNOSIS — I4819 Other persistent atrial fibrillation: Secondary | ICD-10-CM

## 2022-12-04 DIAGNOSIS — I10 Essential (primary) hypertension: Secondary | ICD-10-CM

## 2022-12-05 LAB — BASIC METABOLIC PANEL
BUN/Creatinine Ratio: 15 (ref 12–28)
BUN: 11 mg/dL (ref 8–27)
CO2: 25 mmol/L (ref 20–29)
Calcium: 9.7 mg/dL (ref 8.7–10.3)
Chloride: 97 mmol/L (ref 96–106)
Creatinine, Ser: 0.71 mg/dL (ref 0.57–1.00)
Glucose: 120 mg/dL — ABNORMAL HIGH (ref 70–99)
Potassium: 4.3 mmol/L (ref 3.5–5.2)
Sodium: 137 mmol/L (ref 134–144)
eGFR: 93 mL/min/{1.73_m2} (ref >59)

## 2022-12-05 LAB — CBC
Hematocrit: 40.1 % (ref 34.0–46.6)
Hemoglobin: 13.6 g/dL (ref 11.1–15.9)
MCH: 30.8 pg (ref 26.6–33.0)
MCHC: 33.9 g/dL (ref 31.5–35.7)
MCV: 91 fL (ref 79–97)
Platelets: 353 10*3/uL (ref 150–450)
RBC: 4.42 x10E6/uL (ref 3.77–5.28)
RDW: 12.7 % (ref 11.7–15.4)
WBC: 6.3 10*3/uL (ref 3.4–10.8)

## 2022-12-06 NOTE — Pre-Procedure Instructions (Signed)
Patient scheduled for procedure on 9/27 with anesthesia.  Anesthesia requires Ozempic to be held 7 days before procedure.  Don't take dose after Thursday 9/19 until after your procedure.

## 2022-12-09 ENCOUNTER — Ambulatory Visit: Payer: Medicare Other | Attending: Cardiovascular Disease

## 2022-12-09 DIAGNOSIS — Z5181 Encounter for therapeutic drug level monitoring: Secondary | ICD-10-CM | POA: Insufficient documentation

## 2022-12-09 DIAGNOSIS — I480 Paroxysmal atrial fibrillation: Secondary | ICD-10-CM | POA: Insufficient documentation

## 2022-12-09 DIAGNOSIS — I4891 Unspecified atrial fibrillation: Secondary | ICD-10-CM | POA: Diagnosis present

## 2022-12-09 LAB — POCT INR: INR: 2.6 (ref 2.0–3.0)

## 2022-12-09 NOTE — Patient Instructions (Signed)
Description   Continue taking warfarin 2 tablets daily except for 3 tablets on Sundays, Tuesdays, Thursdays, and Saturdays. Recheck INR in 1 week - ablation on 9/27.  Call Coumadin Clinic [phone]

## 2022-12-10 ENCOUNTER — Telehealth (HOSPITAL_COMMUNITY): Payer: Self-pay | Admitting: *Deleted

## 2022-12-10 NOTE — Telephone Encounter (Signed)
Received call from patient regarding upcoming cardiac imaging study; pt verbalizes understanding of appt date/time, parking situation and where to check in, pre-test NPO status and medications ordered, and verified current allergies; name and call back number provided for further questions should they arise Johney Frame RN Navigator Cardiac Imaging [facility] [phone] office [phone] cell

## 2022-12-10 NOTE — Telephone Encounter (Signed)
Attempted to call patient regarding upcoming cardiac CT appointment. Left message on voicemail with name and callback number Johney Frame RN Navigator Cardiac Imaging [facility] [phone] Office

## 2022-12-11 ENCOUNTER — Ambulatory Visit (HOSPITAL_COMMUNITY)
Admission: RE | Admit: 2022-12-11 | Discharge: 2022-12-11 | Disposition: A | Discharge status: Home / Self Care | Payer: Medicare Other | Source: Ambulatory Visit | Attending: Cardiology | Admitting: Cardiology

## 2022-12-11 DIAGNOSIS — I10 Essential (primary) hypertension: Secondary | ICD-10-CM | POA: Diagnosis present

## 2022-12-11 DIAGNOSIS — I4819 Other persistent atrial fibrillation: Secondary | ICD-10-CM | POA: Insufficient documentation

## 2022-12-11 MED ORDER — IOHEXOL 350 MG/ML SOLN
95.0000 mL | Freq: Once | INTRAVENOUS | Status: AC | PRN
  Administered 2022-12-11: 95 mL via INTRAVENOUS

## 2022-12-17 ENCOUNTER — Ambulatory Visit: Payer: Medicare Other | Attending: Cardiology

## 2022-12-17 DIAGNOSIS — I480 Paroxysmal atrial fibrillation: Secondary | ICD-10-CM | POA: Diagnosis not present

## 2022-12-17 DIAGNOSIS — I4891 Unspecified atrial fibrillation: Secondary | ICD-10-CM | POA: Insufficient documentation

## 2022-12-17 DIAGNOSIS — Z5181 Encounter for therapeutic drug level monitoring: Secondary | ICD-10-CM | POA: Diagnosis present

## 2022-12-17 LAB — POCT INR: INR: 3.7 — AB (ref 2.0–3.0)

## 2022-12-17 NOTE — Pre-Procedure Instructions (Signed)
Attempted to call patient regarding procedure.  Left voicemail on the following items: Arrival time 5:15 Nothing to eat or drink after midnight No meds AM of procedure Responsible person to drive you home and stay with you for 24 hrs  Have you missed any doses of anti-coagulant Coumadin- should be taken once a day, if you have missed any doses please let us know.  Don't take any medication in the morning.

## 2022-12-17 NOTE — Patient Instructions (Signed)
Description   Only take 2.5 tablets today and then continue taking warfarin 2 tablets daily except for 3 tablets on Sundays, Tuesdays, Thursdays, and Saturdays.  Recheck INR in 1 week .  Call Coumadin Clinic [phone]

## 2022-12-17 NOTE — Anesthesia Preprocedure Evaluation (Addendum)
Anesthesia Evaluation  Patient identified by MRN, date of birth, ID band Patient awake    Reviewed: Allergy & Precautions, NPO status , Patient's Chart, lab work & pertinent test results  History of Anesthesia Complications (+) PONV and history of anesthetic complications  Airway Mallampati: IV  TM Distance: >3 FB Neck ROM: Full    Dental  (+) Teeth Intact, Dental Advisory Given   Pulmonary asthma , sleep apnea    Pulmonary exam normal breath sounds clear to auscultation       Cardiovascular hypertension, + angina  + DVT  Normal cardiovascular exam+ dysrhythmias Atrial Fibrillation + Valvular Problems/Murmurs MR  Rhythm:Regular Rate:Normal     Neuro/Psych  Neuromuscular disease    GI/Hepatic Neg liver ROS, hiatal hernia,GERD  ,,  Endo/Other  diabetes, Type 2    Renal/GU negative Renal ROS     Musculoskeletal  (+) Arthritis ,    Abdominal   Peds  Hematology negative hematology ROS (+)   Anesthesia Other Findings   Reproductive/Obstetrics                             Anesthesia Physical Anesthesia Plan  ASA: 3  Anesthesia Plan: General   Post-op Pain Management: Tylenol PO (pre-op)*   Induction:   PONV Risk Score and Plan: 4 or greater and Midazolam, Dexamethasone, Ondansetron and Scopolamine patch - Pre-op  Airway Management Planned: Oral ETT and Video Laryngoscope Planned  Additional Equipment:   Intra-op Plan:   Post-operative Plan: Extubation in OR  Informed Consent: I have reviewed the patients History and Physical, chart, labs and discussed the procedure including the risks, benefits and alternatives for the proposed anesthesia with the patient or authorized representative who has indicated his/her understanding and acceptance.     Dental advisory given  Plan Discussed with: CRNA  Anesthesia Plan Comments:         Anesthesia Quick Evaluation

## 2022-12-18 ENCOUNTER — Ambulatory Visit (HOSPITAL_BASED_OUTPATIENT_CLINIC_OR_DEPARTMENT_OTHER): Payer: Medicare Other | Admitting: Anesthesiology

## 2022-12-18 ENCOUNTER — Other Ambulatory Visit: Payer: Self-pay

## 2022-12-18 ENCOUNTER — Ambulatory Visit (HOSPITAL_COMMUNITY): Payer: Medicare Other | Admitting: Anesthesiology

## 2022-12-18 ENCOUNTER — Telehealth: Payer: Self-pay | Admitting: Cardiology

## 2022-12-18 ENCOUNTER — Encounter (HOSPITAL_COMMUNITY)
Admission: RE | Disposition: A | Discharge status: Home / Self Care | Payer: Self-pay | Source: Home / Self Care | Attending: Cardiology

## 2022-12-18 ENCOUNTER — Ambulatory Visit (HOSPITAL_COMMUNITY)
Admission: RE | Admit: 2022-12-18 | Discharge: 2022-12-18 | Disposition: A | Discharge status: Home / Self Care | Payer: Medicare Other | Attending: Cardiology | Admitting: Cardiology

## 2022-12-18 ENCOUNTER — Encounter: Payer: Self-pay | Admitting: Family

## 2022-12-18 ENCOUNTER — Other Ambulatory Visit (HOSPITAL_COMMUNITY): Payer: Self-pay

## 2022-12-18 DIAGNOSIS — Z7901 Long term (current) use of anticoagulants: Secondary | ICD-10-CM | POA: Insufficient documentation

## 2022-12-18 DIAGNOSIS — I10 Essential (primary) hypertension: Secondary | ICD-10-CM | POA: Insufficient documentation

## 2022-12-18 DIAGNOSIS — I483 Typical atrial flutter: Secondary | ICD-10-CM | POA: Insufficient documentation

## 2022-12-18 DIAGNOSIS — I4819 Other persistent atrial fibrillation: Secondary | ICD-10-CM | POA: Diagnosis present

## 2022-12-18 DIAGNOSIS — I4891 Unspecified atrial fibrillation: Secondary | ICD-10-CM

## 2022-12-18 HISTORY — PX: ATRIAL FIBRILLATION ABLATION: EP1191

## 2022-12-18 LAB — GLUCOSE, CAPILLARY
Glucose-Capillary: 125 mg/dL — ABNORMAL HIGH (ref 70–99)
Glucose-Capillary: 160 mg/dL — ABNORMAL HIGH (ref 70–99)

## 2022-12-18 LAB — PROTIME-INR
INR: 2.9 — ABNORMAL HIGH (ref 0.8–1.2)
Prothrombin Time: 30.7 s — ABNORMAL HIGH (ref 11.4–15.2)

## 2022-12-18 LAB — POCT ACTIVATED CLOTTING TIME
Activated Clotting Time: 354 s
Activated Clotting Time: 379 s

## 2022-12-18 SURGERY — ATRIAL FIBRILLATION ABLATION
Anesthesia: General

## 2022-12-18 MED ORDER — ALBUTEROL SULFATE (2.5 MG/3ML) 0.083% IN NEBU: 2.5000 mg | INHALATION_SOLUTION | Freq: Once | RESPIRATORY_TRACT | Status: AC

## 2022-12-18 MED ORDER — COLCHICINE 0.6 MG PO TABS
0.6000 mg | ORAL_TABLET | Freq: Two times a day (BID) | ORAL | Status: DC
  Administered 2022-12-18: 0.6 mg via ORAL
  Filled 2022-12-18: qty 1

## 2022-12-18 MED ORDER — ONDANSETRON HCL 4 MG/2ML IJ SOLN: 4.0000 mg | Freq: Once | INTRAMUSCULAR | Status: DC | PRN

## 2022-12-18 MED ORDER — HEPARIN SODIUM (PORCINE) 1000 UNIT/ML IJ SOLN
INTRAMUSCULAR | Status: DC | PRN
  Administered 2022-12-18: 1000 [IU] via INTRAVENOUS

## 2022-12-18 MED ORDER — PANTOPRAZOLE SODIUM 40 MG PO TBEC
40.0000 mg | DELAYED_RELEASE_TABLET | Freq: Every day | ORAL | 0 refills | Status: DC
Start: 2022-12-18 — End: 2023-01-14
  Filled 2022-12-18: qty 45, 45d supply, fill #0

## 2022-12-18 MED ORDER — ONDANSETRON HCL 4 MG/2ML IJ SOLN
INTRAMUSCULAR | Status: DC | PRN
  Administered 2022-12-18: 4 mg via INTRAVENOUS

## 2022-12-18 MED ORDER — ATROPINE SULFATE 1 MG/ML IV SOLN
INTRAVENOUS | Status: DC | PRN
Start: 2022-12-18 — End: 2022-12-18
  Administered 2022-12-18: 1 mg via INTRAVENOUS

## 2022-12-18 MED ORDER — ONDANSETRON HCL 4 MG/2ML IJ SOLN: 4.0000 mg | Freq: Four times a day (QID) | INTRAMUSCULAR | Status: DC | PRN

## 2022-12-18 MED ORDER — HEPARIN SODIUM (PORCINE) 1000 UNIT/ML IJ SOLN
INTRAMUSCULAR | Status: DC | PRN
Start: 2022-12-18 — End: 2022-12-18
  Administered 2022-12-18: 10000 [IU] via INTRAVENOUS

## 2022-12-18 MED ORDER — FENTANYL CITRATE (PF) 250 MCG/5ML IJ SOLN
INTRAMUSCULAR | Status: DC | PRN
  Administered 2022-12-18 (×4): 50 ug via INTRAVENOUS

## 2022-12-18 MED ORDER — PANTOPRAZOLE SODIUM 40 MG PO TBEC
40.0000 mg | DELAYED_RELEASE_TABLET | Freq: Every day | ORAL | Status: DC
  Administered 2022-12-18: 40 mg via ORAL
  Filled 2022-12-18: qty 1

## 2022-12-18 MED ORDER — MIDAZOLAM HCL 2 MG/2ML IJ SOLN
INTRAMUSCULAR | Status: DC | PRN
  Administered 2022-12-18 (×2): 1 mg via INTRAVENOUS

## 2022-12-18 MED ORDER — SUGAMMADEX SODIUM 200 MG/2ML IV SOLN
INTRAVENOUS | Status: DC | PRN
  Administered 2022-12-18: 300 mg via INTRAVENOUS

## 2022-12-18 MED ORDER — HEPARIN (PORCINE) IN NACL 1000-0.9 UT/500ML-% IV SOLN
INTRAVENOUS | Status: DC | PRN
  Administered 2022-12-18 (×5): 500 mL

## 2022-12-18 MED ORDER — NITROGLYCERIN 1 MG/10 ML FOR IR/CATH LAB
INTRA_ARTERIAL | Status: DC | PRN
  Administered 2022-12-18: 200 ug via INTRAVENOUS

## 2022-12-18 MED ORDER — DEXAMETHASONE SODIUM PHOSPHATE 10 MG/ML IJ SOLN
INTRAMUSCULAR | Status: DC | PRN
  Administered 2022-12-18: 10 mg via INTRAVENOUS

## 2022-12-18 MED ORDER — ACETAMINOPHEN 325 MG PO TABS
650.0000 mg | ORAL_TABLET | ORAL | Status: DC | PRN
  Administered 2022-12-18: 650 mg via ORAL
  Filled 2022-12-18: qty 2

## 2022-12-18 MED ORDER — SODIUM CHLORIDE 0.9% FLUSH: 3.0000 mL | Freq: Two times a day (BID) | INTRAVENOUS | Status: DC

## 2022-12-18 MED ORDER — LIDOCAINE 2% (20 MG/ML) 5 ML SYRINGE
INTRAMUSCULAR | Status: DC | PRN
  Administered 2022-12-18: 80 mg via INTRAVENOUS

## 2022-12-18 MED ORDER — PROPOFOL 10 MG/ML IV BOLUS
INTRAVENOUS | Status: DC | PRN
  Administered 2022-12-18: 100 mg via INTRAVENOUS
  Administered 2022-12-18 (×2): 50 mg via INTRAVENOUS

## 2022-12-18 MED ORDER — ACETAMINOPHEN 500 MG PO TABS
1000.0000 mg | ORAL_TABLET | Freq: Once | ORAL | Status: AC
  Administered 2022-12-18: 1000 mg via ORAL
  Filled 2022-12-18: qty 2

## 2022-12-18 MED ORDER — SODIUM CHLORIDE 0.9 % IV SOLN: 250.0000 mL | INTRAVENOUS | Status: DC | PRN

## 2022-12-18 MED ORDER — SCOPOLAMINE 1 MG/3DAYS TD PT72
1.0000 | MEDICATED_PATCH | Freq: Once | TRANSDERMAL | Status: DC
  Administered 2022-12-18: 1.5 mg via TRANSDERMAL
  Filled 2022-12-18: qty 1

## 2022-12-18 MED ORDER — COLCHICINE 0.6 MG PO TABS
0.6000 mg | ORAL_TABLET | Freq: Two times a day (BID) | ORAL | 0 refills | Status: DC
  Filled 2022-12-18: qty 10, 5d supply, fill #0

## 2022-12-18 MED ORDER — AMISULPRIDE (ANTIEMETIC) 5 MG/2ML IV SOLN: 10.0000 mg | Freq: Once | INTRAVENOUS | Status: DC | PRN

## 2022-12-18 MED ORDER — DEXMEDETOMIDINE HCL IN NACL 80 MCG/20ML IV SOLN
INTRAVENOUS | Status: DC | PRN
Start: 2022-12-18 — End: 2022-12-18
  Administered 2022-12-18 (×2): 8 ug via INTRAVENOUS

## 2022-12-18 MED ORDER — GLYCOPYRROLATE 0.2 MG/ML IJ SOLN
INTRAMUSCULAR | Status: DC | PRN
  Administered 2022-12-18: .2 mg via INTRAVENOUS

## 2022-12-18 MED ORDER — PROTAMINE SULFATE 10 MG/ML IV SOLN
INTRAVENOUS | Status: DC | PRN
Start: 2022-12-18 — End: 2022-12-18
  Administered 2022-12-18: 35 mg via INTRAVENOUS

## 2022-12-18 MED ORDER — ROCURONIUM BROMIDE 10 MG/ML (PF) SYRINGE
PREFILLED_SYRINGE | INTRAVENOUS | Status: DC | PRN
  Administered 2022-12-18: 10 mg via INTRAVENOUS
  Administered 2022-12-18: 50 mg via INTRAVENOUS
  Administered 2022-12-18: 20 mg via INTRAVENOUS
  Administered 2022-12-18: 30 mg via INTRAVENOUS
  Administered 2022-12-18 (×2): 10 mg via INTRAVENOUS

## 2022-12-18 MED ORDER — SODIUM CHLORIDE 0.9 % IV SOLN: INTRAVENOUS | Status: DC

## 2022-12-18 MED ORDER — NITROGLYCERIN 1 MG/10 ML FOR IR/CATH LAB
INTRA_ARTERIAL | Status: AC
  Filled 2022-12-18: qty 20

## 2022-12-18 MED ORDER — SODIUM CHLORIDE 0.9% FLUSH: 3.0000 mL | INTRAVENOUS | Status: DC | PRN

## 2022-12-18 MED ORDER — PROPOFOL 500 MG/50ML IV EMUL
INTRAVENOUS | Status: DC | PRN
Start: 2022-12-18 — End: 2022-12-18
  Administered 2022-12-18: 35 ug/kg/min via INTRAVENOUS

## 2022-12-18 MED ORDER — FENTANYL CITRATE (PF) 100 MCG/2ML IJ SOLN: 25.0000 ug | INTRAMUSCULAR | Status: DC | PRN

## 2022-12-18 MED ORDER — ALBUTEROL SULFATE (2.5 MG/3ML) 0.083% IN NEBU
INHALATION_SOLUTION | RESPIRATORY_TRACT | Status: AC
  Administered 2022-12-18: 2.5 mg via RESPIRATORY_TRACT
  Filled 2022-12-18: qty 3

## 2022-12-18 SURGICAL SUPPLY — 24 items
BAG SNAP BAND KOVER 36X36 (MISCELLANEOUS) IMPLANT
BLANKET WARM UNDERBOD FULL ACC (MISCELLANEOUS) ×1 IMPLANT
CABLE PFA RX CATH CONN (CABLE) IMPLANT
CATH FARAWAVE ABLATION 31 (CATHETERS) IMPLANT
CATH OCTARAY 2.0 F 3-3-3-3-3 (CATHETERS) IMPLANT
CATH SMTCH THERMOCOOL SF DF (CATHETERS) IMPLANT
CATH SOUNDSTAR ECO 8FR (CATHETERS) IMPLANT
CATH WEBSTER BI DIR CS D-F CRV (CATHETERS) IMPLANT
CLOSURE PERCLOSE PROSTYLE (VASCULAR PRODUCTS) IMPLANT
COVER SWIFTLINK CONNECTOR (BAG) ×1 IMPLANT
DILATOR VESSEL 38 20CM 16FR (INTRODUCER) IMPLANT
GUIDEWIRE INQWIRE 1.5J.035X260 (WIRE) IMPLANT
INQWIRE 1.5J .035X260CM (WIRE) ×1
PACK EP LATEX FREE (CUSTOM PROCEDURE TRAY) ×1
PACK EP LF (CUSTOM PROCEDURE TRAY) ×1 IMPLANT
PAD DEFIB RADIO PHYSIO CONN (PAD) ×1 IMPLANT
PATCH CARTO3 (PAD) IMPLANT
SHEATH FARADRIVE STEERABLE (SHEATH) IMPLANT
SHEATH PINNACLE 8F 10CM (SHEATH) IMPLANT
SHEATH PINNACLE 9F 10CM (SHEATH) IMPLANT
SHEATH PROBE COVER 6X72 (BAG) IMPLANT
SHEATH WIRE KIT BAYLIS SL1 (KITS) IMPLANT
TUBING SMART ABLATE COOLFLOW (TUBING) IMPLANT
WIRE HI TORQ VERSACORE-J 145CM (WIRE) IMPLANT

## 2022-12-18 NOTE — Telephone Encounter (Signed)
    Patient s/p ablation with Dr. Lalla Brothers today called this evening to report that when she used the bathroom for the first time after returning home, she noticed blood spots in her underwear. Her husband inspected her groin access sites and found that there was some oozing from the left groin. Per instructions, patient called the after hours answering service. Patient denies pain or numbness in the leg and she does report that there was still a small amount of active oozing when she left the hospital this evening. She has been applying direct pressure with her hands and a towel over the last few minutes. Given that this was venous access, instructed patient to apply heavy direct pressure for 45 straight minutes without checking the site. If she were to continue with oozing after 45 minutes of direct pressure, I advised her that she would need to present to the ED for visual inspection and possible hemostasis measure such as intradermal lidocaine/epinephrine. Discussed that if she developed pain or hematoma, that would also warrant emergent evaluation.  Perlie Gold, PA-C

## 2022-12-18 NOTE — Discharge Instructions (Signed)

## 2022-12-18 NOTE — Transfer of Care (Signed)
Immediate Anesthesia Transfer of Care Note  Patient: [name]  Procedure(s) Performed: ATRIAL FIBRILLATION ABLATION  Patient Location: PACU  Anesthesia Type:General  Level of Consciousness: awake  Airway & Oxygen Therapy: Patient Spontanous Breathing  Post-op Assessment: Report given to RN  Post vital signs: Reviewed and stable  Last Vitals:  Vitals Value Taken Time  BP    Temp    Pulse    Resp    SpO2      Last Pain:  Vitals:   12/18/22 0553  TempSrc:   PainSc: 0-No pain      Patients Stated Pain Goal: 4 (12/18/22 0553)  Complications: No notable events documented.

## 2022-12-18 NOTE — H&P (Signed)
Electrophysiology Office Follow up Visit Note:     Date:  12/18/2022    ID:  [name], DOB [birth year], MRN [id number]   PCP:  Tally Joe, MD      [facility] Cardiologist:  Jodelle Red, MD  [facility] Electrophysiologist:  Lanier Prude, MD      Interval History:     [name] is a 67 y.o. female who presents for a follow up visit.    Last seen 08/10/2022 by Luster Landsberg. I saw her 09/09/2021.    Last ablation 05/07/2016 by Dr Johney Frame. During that ablation, there was residual electrical activity within the RSPV and RIPV. There was also conduction out of the LIPV. The posterior wall was not targeted. The CTI was also ablated during the 04/2016 ablation.   At the appointment with Luster Landsberg she reported more AF episodes. She feels poorly while in AF.      AFib hx AFib ablation at [facility] 2010, Dr. Sharlene Dory PVI and CTI ablation 05/07/2016   AAD Hx Flecainide dose limited with development of BBB > failed to maintain SR and stopped Dec 2017 Multaq stopped post PVI ablation 2018, notes also report poorly tolerated (unclear), she does not recall ever taking it Metoprolol succ bothered her asthma, made her feel unwell       Today she reports an episode of atrial fibrillation occurring every 2 to 4 weeks.  They last between 5 and 24 hours and are highly symptomatic.    Presents for PVI today. Procedure reviewed. She continues to have symptomatic episodes of AF.   Objective Past medical, surgical, social and family history were reviewed.   ROS:   Please see the history of present illness.    All other systems reviewed and are negative.   EKGs/Labs/Other Studies Reviewed:     The following studies were reviewed today:   EKG:  The ekg ordered today demonstrates sinus rhythm.  QRS duration 84.  QTc is 433 ms.     Physical Exam:     VS:  BP 148/73   Pulse 66   Ht 5\' 4"  (1.626 m)   Wt 204 lb (92.5 kg)   SpO2 95%   BMI 35.02 kg/m         Wt  Readings from Last 3 Encounters:  08/28/22 204 lb (92.5 kg)  08/10/22 210 lb (95.3 kg)  06/03/22 212 lb 6.4 oz (96.3 kg)      GEN:  Well nourished, well developed in no acute distress CARDIAC: RRR, no murmurs, rubs, gallops RESPIRATORY:  Clear to auscultation without rales, wheezing or rhonchi          Assessment ASSESSMENT:     1. Persistent atrial fibrillation (HCC)   2. Primary hypertension     PLAN:     In order of problems listed above:   #Persistent AF S/p prior ablations as above.  On warfarin for stroke ppx. We discussed options during today's visit including convergent ablation, redo catheter ablation and alternative antiarrhythmic medications.   Discussed treatment options today for AF including antiarrhythmic drug therapy and ablation. Discussed risks, recovery and likelihood of success with each treatment strategy. Risk, benefits, and alternatives to EP study and radiofrequency ablation for afib were discussed. These risks include but are not limited to stroke, bleeding, vascular damage, tamponade, perforation, damage to the esophagus, lungs, phrenic nerve and other structures, pulmonary vein stenosis, worsening renal function, and death.  Discussed potential need for repeat ablation procedures and antiarrhythmic drugs  after an initial ablation. The patient understands these risk and wishes to proceed.  We will therefore proceed with catheter ablation at the next available time.  Carto, ICE, anesthesia are requested for the procedure.  Will also obtain CT PV protocol prior to the procedure to exclude LAA thrombus and further evaluate atrial anatomy.   #Hypertension At goal today.  Recommend checking blood pressures 1-2 times per week at home and recording the values.  Recommend bringing these recordings to the primary care physician.   Presents for PVI today. Procedure reviewed.     Signed, Steffanie Dunn, MD, [facility], [facility] 12/18/2022 Electrophysiology [place]  [facility]

## 2022-12-18 NOTE — Anesthesia Postprocedure Evaluation (Signed)
Anesthesia Post Note  Patient: [name]  Procedure(s) Performed: ATRIAL FIBRILLATION ABLATION     Patient location during evaluation: PACU Anesthesia Type: General Level of consciousness: awake and alert Pain management: pain level controlled Vital Signs Assessment: post-procedure vital signs reviewed and stable Respiratory status: spontaneous breathing, nonlabored ventilation, respiratory function stable and patient connected to nasal cannula oxygen Cardiovascular status: blood pressure returned to baseline and stable Postop Assessment: no apparent nausea or vomiting Anesthetic complications: no   No notable events documented.  Last Vitals:  Vitals:   12/18/22 1300 12/18/22 1400  BP: (!) 153/68 (!) 143/82  Pulse: 84 89  Resp: 14 15  Temp:    SpO2: 92% 94%    Last Pain:  Vitals:   12/18/22 1315  TempSrc:   PainSc: 7                  Collene Schlichter

## 2022-12-18 NOTE — Progress Notes (Signed)
Notified CRNA Arvilla Market and Anesthesiologist Desmond Lope of pt's increased temperature from pre-op (97.42F) to post-op (99.2F). Pt reporting no symptoms or concerns, aside from wheezing which subsided during recovery period. Vital signs stable (see flowsheets). No new orders at this time. MD Desmond Lope at bedside.

## 2022-12-18 NOTE — Progress Notes (Signed)
Pt ambulated to and from bathroom to void with no signs of oozing from bilateral sites

## 2022-12-18 NOTE — Anesthesia Procedure Notes (Signed)
Procedure Name: Intubation Date/Time: 12/18/2022 7:57 AM  Performed by: Vena Austria, CRNAPre-anesthesia Checklist: Patient identified, Emergency Drugs available, Suction available, Patient being monitored and Timeout performed Patient Re-evaluated:Patient Re-evaluated prior to induction Oxygen Delivery Method: Circle system utilized Preoxygenation: Pre-oxygenation with 100% oxygen Induction Type: IV induction Ventilation: Oral airway inserted - appropriate to patient size Laryngoscope Size: Glidescope and 3 Grade View: Grade I Tube type: Oral Number of attempts: 2 Airway Equipment and Method: Video-laryngoscopy Placement Confirmation: ETT inserted through vocal cords under direct vision, positive ETCO2, CO2 detector and breath sounds checked- equal and bilateral Secured at: 22 cm Tube secured with: Tape Dental Injury: Teeth and Oropharynx as per pre-operative assessment  Difficulty Due To: Difficult Airway- due to limited oral opening Comments: Pt has hx of TMJ, not great opening, first intubation ett cuff had small whole from teeth, removed and reintubated GLIDO both attempts

## 2022-12-21 ENCOUNTER — Encounter (HOSPITAL_COMMUNITY): Payer: Self-pay | Admitting: Cardiology

## 2022-12-24 ENCOUNTER — Ambulatory Visit: Payer: Medicare Other | Attending: Internal Medicine

## 2022-12-24 DIAGNOSIS — I4891 Unspecified atrial fibrillation: Secondary | ICD-10-CM | POA: Insufficient documentation

## 2022-12-24 DIAGNOSIS — Z5181 Encounter for therapeutic drug level monitoring: Secondary | ICD-10-CM | POA: Insufficient documentation

## 2022-12-24 DIAGNOSIS — I480 Paroxysmal atrial fibrillation: Secondary | ICD-10-CM | POA: Diagnosis not present

## 2022-12-24 LAB — POCT INR: INR: 4.3 — AB (ref 2.0–3.0)

## 2022-12-24 NOTE — Patient Instructions (Signed)
Description   HOLD today's dose and then START taking warfarin 2 tablets daily except for 3 tablets on Tuesdays, Thursdays, and Saturdays.  Recheck INR in 2 weeks .  Call Coumadin Clinic [phone]

## 2023-01-05 ENCOUNTER — Ambulatory Visit (INDEPENDENT_AMBULATORY_CARE_PROVIDER_SITE_OTHER): Payer: Medicare Other | Admitting: Podiatry

## 2023-01-05 ENCOUNTER — Encounter: Payer: Self-pay | Admitting: Podiatry

## 2023-01-05 DIAGNOSIS — B351 Tinea unguium: Secondary | ICD-10-CM | POA: Diagnosis not present

## 2023-01-05 DIAGNOSIS — M79675 Pain in left toe(s): Secondary | ICD-10-CM | POA: Diagnosis not present

## 2023-01-05 DIAGNOSIS — M79674 Pain in right toe(s): Secondary | ICD-10-CM | POA: Diagnosis not present

## 2023-01-05 DIAGNOSIS — E1169 Type 2 diabetes mellitus with other specified complication: Secondary | ICD-10-CM | POA: Diagnosis not present

## 2023-01-05 NOTE — Progress Notes (Signed)
This patient returns to my office for at risk foot care.  This patient requires this care by a professional since this patient will be at risk due to having coagulation defect.  Patient is taking coumadin.  This patient is unable to cut nails herself since the patient cannot reach her nails.These nails are painful walking and wearing shoes.  This patient presents for at risk foot care today.  General Appearance  Alert, conversant and in no acute stress.  Vascular  Dorsalis pedis and posterior tibial  pulses are palpable  bilaterally.  Capillary return is within normal limits  bilaterally. Temperature is within normal limits  bilaterally.  Neurologic  Senn-Weinstein monofilament wire test within normal limits  bilaterally. Muscle power within normal limits bilaterally.  Nails Thick disfigured discolored nails with subungual debris  Second left and first and third right foot.  No evidence of bacterial infection or drainage bilaterally.  Orthopedic  No limitations of motion  feet .  No crepitus or effusions noted.  No bony pathology or digital deformities noted.  Skin  normotropic skin with no porokeratosis noted bilaterally.  No signs of infections or ulcers noted.     Onychomycosis  Pain in right toes  Pain in left toes  Consent was obtained for treatment procedures.   Mechanical debridement of nails 1-5  bilaterally performed with a nail nipper.  Filed with dremel without incident.    Return office visit   9 weeks                  Told patient to return for periodic foot care and evaluation due to potential at risk complications.   Helane Gunther DPM

## 2023-01-07 ENCOUNTER — Ambulatory Visit: Payer: Medicare Other | Attending: Cardiology | Admitting: *Deleted

## 2023-01-07 DIAGNOSIS — I480 Paroxysmal atrial fibrillation: Secondary | ICD-10-CM

## 2023-01-07 DIAGNOSIS — Z01810 Encounter for preprocedural cardiovascular examination: Secondary | ICD-10-CM | POA: Diagnosis present

## 2023-01-07 DIAGNOSIS — I4891 Unspecified atrial fibrillation: Secondary | ICD-10-CM | POA: Diagnosis present

## 2023-01-07 DIAGNOSIS — Z5181 Encounter for therapeutic drug level monitoring: Secondary | ICD-10-CM | POA: Diagnosis not present

## 2023-01-07 LAB — POCT INR: INR: 2.8 (ref 2.0–3.0)

## 2023-01-07 NOTE — Patient Instructions (Signed)
Description   Continue taking warfarin 2 tablets daily except for 3 tablets on Tuesdays, Thursdays, and Saturdays.  Recheck INR in 3 weeks .  Call Coumadin Clinic [phone]

## 2023-01-14 ENCOUNTER — Other Ambulatory Visit: Payer: Self-pay | Admitting: Nurse Practitioner

## 2023-01-15 ENCOUNTER — Ambulatory Visit (HOSPITAL_COMMUNITY)
Admission: RE | Admit: 2023-01-15 | Discharge: 2023-01-15 | Disposition: A | Discharge status: Home / Self Care | Payer: Medicare Other | Source: Ambulatory Visit | Attending: Internal Medicine | Admitting: Internal Medicine

## 2023-01-15 ENCOUNTER — Encounter (HOSPITAL_COMMUNITY): Payer: Self-pay | Admitting: Physician Assistant

## 2023-01-15 VITALS — BP 134/76 | HR 76 | Ht 64.0 in | Wt 203.0 lb

## 2023-01-15 DIAGNOSIS — Z79899 Other long term (current) drug therapy: Secondary | ICD-10-CM | POA: Insufficient documentation

## 2023-01-15 DIAGNOSIS — E669 Obesity, unspecified: Secondary | ICD-10-CM | POA: Diagnosis not present

## 2023-01-15 DIAGNOSIS — D6869 Other thrombophilia: Secondary | ICD-10-CM | POA: Insufficient documentation

## 2023-01-15 DIAGNOSIS — Z6834 Body mass index (BMI) 34.0-34.9, adult: Secondary | ICD-10-CM | POA: Diagnosis not present

## 2023-01-15 DIAGNOSIS — I2510 Atherosclerotic heart disease of native coronary artery without angina pectoris: Secondary | ICD-10-CM | POA: Insufficient documentation

## 2023-01-15 DIAGNOSIS — I4819 Other persistent atrial fibrillation: Secondary | ICD-10-CM | POA: Diagnosis present

## 2023-01-15 DIAGNOSIS — G4733 Obstructive sleep apnea (adult) (pediatric): Secondary | ICD-10-CM | POA: Insufficient documentation

## 2023-01-15 DIAGNOSIS — I10 Essential (primary) hypertension: Secondary | ICD-10-CM | POA: Insufficient documentation

## 2023-01-15 DIAGNOSIS — Z7901 Long term (current) use of anticoagulants: Secondary | ICD-10-CM | POA: Insufficient documentation

## 2023-01-15 NOTE — Progress Notes (Signed)
Primary Care Physician: Tally Joe, MD Primary Cardiologist: Jodelle Red, MD Electrophysiologist: Lanier Prude, MD  Referring Physician: Dr Garrison Columbus is a 67 y.o. female with a history of RA, HTN, HLD, CAD, DVT, atrial fibrillation who presents for follow up in the [facility].  The patient was initially diagnosed with atrial fibrillation remotely and has had ablations in 2010, 2018, and 12/18/22. Patient is on warfarin for a CHADS2VASC score of 4.  On follow up today, patient reports that she did have episodes of tachypalpitations for the first couple weeks after ablation but these have resolved. She denies any chest pain or groin issues.   Today, she denies symptoms of chest pain, shortness of breath, orthopnea, PND, lower extremity edema, dizziness, presyncope, syncope, snoring, daytime somnolence, bleeding, or neurologic sequela. The patient is tolerating medications without difficulties and is otherwise without complaint today.    Atrial Fibrillation Risk Factors:  she does have symptoms or diagnosis of sleep apnea. she does not have a history of rheumatic fever.   Atrial Fibrillation Management history:  Previous antiarrhythmic drugs: flecainide (BBB), Multaq (not tolerated) Previous ablations: 2010 [facility]), PVI and CTI 2/15/218, PVI and CTI 12/18/22 Anticoagulation history: warfarin  ROS- All systems are reviewed and negative except as per the HPI above.  Past Medical History:  Diagnosis Date   Allergic rhinitis    Anginal pain (HCC)    Arthritis    "back" (05/07/2016)   Asthma    Atrial flutter (HCC)    a. Remotely per notes.   Carpal tunnel syndrome, bilateral    Colon polyps    Diverticulitis    Dizziness    DVT (deep venous thrombosis) (HCC) 1980s x2 -    between birth of two sons. Saw hematology - was told she has a hypercoagulable disorder, should be on Coumadin lifelong.   Dysrhythmia     atrial fib   Family history of adverse reaction to anesthesia    "mother gets PONV"   Gallbladder disease    GERD (gastroesophageal reflux disease)    GIB (gastrointestinal bleeding) 04/14/2018   Halothane adverse reaction    narrow small opening   Heart murmur    Hiatal hernia    History of hiatal hernia    "small one/CTA 05/01/2016" (05/07/2016)   History of kidney stones    HTN (hypertension)    Hypercholesteremia    hx; "brought it down w/diet" (05/07/2016)   Hyperglycemia    a. A1c 6.1 in 2014.   Inguinal hernia    Iron deficiency anemia due to chronic blood loss 04/14/2018   Iron malabsorption 04/14/2018   Left sided sciatica    Normal cardiac stress test 06/2012   Obesity    OSA (obstructive sleep apnea)    a. Mild, did not tolerate CPAP (05/07/2016)   PAF (paroxysmal atrial fibrillation) (HCC)    a. s/p afib ablation at [facility] 2010 (Dr. Sharlene Dory). b. Recurrent AF s/p DCCV 06/2010. c. On flecainide.    Paroxysmal atrial fibrillation (HCC) 09/09/2009   Qualifier: Diagnosis of  By: Susette Racer CMA, Jewel     PONV (postoperative nausea and vomiting)    PPD positive, treated 1977   "treated for 1 yr after exposure to patient"   Pre-diabetes    RSV infection ~ 2006   Vertigo    Wandering (atrial) pacemaker    a. Remotely per notes.  (  pt. states has a wandering p wave ) no  pacemaker    Current Outpatient Medications  Medication Sig Dispense Refill   acetaminophen (TYLENOL) 650 MG CR tablet Take 1,300 mg by mouth every 8 (eight) hours as needed for pain.      APPLE CIDER VINEGAR PO Take 450 mg by mouth in the morning.     cetirizine (ZYRTEC) 10 MG tablet Take 10 mg by mouth in the morning.     Cholecalciferol (VITAMIN D3) 50 MCG (2000 UT) capsule Take 2,000 Units by mouth in the morning.     Continuous Blood Gluc Sensor (FREESTYLE LIBRE 2 SENSOR) MISC APPLY ONE SENSOR TO THE BACK OF THE UPPER ARM EVERY 14 DAYS     dicyclomine (BENTYL) 10 MG capsule TAKE 1 CAPSULE BY MOUTH  30 MINS BEFORE MEALS AND AT BEDTIME AS NEEDED FOR PAIN/CRAMPING 360 capsule 1   ezetimibe (ZETIA) 10 MG tablet TAKE 1 TABLET BY MOUTH EVERY DAY 90 tablet 3   FLOVENT HFA 220 MCG/ACT inhaler Inhale 1 puff into the lungs 2 (two) times daily.  1   fluticasone (FLONASE) 50 MCG/ACT nasal spray Place 1 spray into both nostrils 2 (two) times daily.      isosorbide mononitrate (IMDUR) 30 MG 24 hr tablet TAKE 1 TABLET BY MOUTH EVERY DAY 90 tablet 3   losartan (COZAAR) 25 MG tablet Take 1 tablet (25 mg total) by mouth daily. 90 tablet 0   meclizine (ANTIVERT) 25 MG tablet Take 1 tablet (25 mg total) by mouth 3 (three) times daily as needed for dizziness. 30 tablet 0   Multiple Vitamin (MULTIVITAMIN WITH MINERALS) TABS tablet Take 1 tablet by mouth in the morning.     nitroGLYCERIN (NITROSTAT) 0.4 MG SL tablet PLACE 1 TABLET UNDER THE TONGUE EVERY 5 MINUTES AS NEEDED. 25 tablet 11   omeprazole (PRILOSEC) 40 MG capsule TAKE 1 CAPSULE BY MOUTH DAILY, CAN INCREASE TO 2 TIMES DAILY IF NEEDED 180 capsule 0   OZEMPIC, 1 MG/DOSE, 4 MG/3ML SOPN Inject 1 mg into the skin once a week. Tuesdays     polyethylene glycol (MIRALAX) 17 g packet Take 17 g by mouth 2 (two) times daily. Titrate as needed (Patient taking differently: Take 17 g by mouth 2 (two) times daily as needed (constipation.).) 14 each 0   PROAIR HFA 108 (90 BASE) MCG/ACT inhaler Inhale 2 puffs into the lungs every 6 (six) hours as needed for wheezing or shortness of breath.      venlafaxine XR (EFFEXOR-XR) 37.5 MG 24 hr capsule Take 37.5 mg by mouth daily with breakfast.      verapamil (CALAN) 80 MG tablet Take 160 mg by mouth 2 (two) times daily.     warfarin (COUMADIN) 2 MG tablet TAKE 4MG  DAILY EXCEPT 6MG  ON TUESDAYS, THURSDAYS,AND SATURDAYS AS DIRECTED BY ANTICOAGULATION CLINIC (Patient taking differently: Take 4-6 mg by mouth See admin instructions. TAKE 4MG  DAILY ON MON WED AND FRI AND  6MG  ON SUNDAYS, TUESDAYS, THURSDAYS, AND SATURDAYS AS DIRECTED BY  ANTICOAGULATION CLINIC) 225 tablet 1   colchicine 0.6 MG tablet Take 1 tablet (0.6 mg total) by mouth 2 (two) times daily for 5 days. 10 tablet 0   No current facility-administered medications for this encounter.    Physical Exam: BP 134/76   Pulse 76   Ht 5\' 4"  (1.626 m)   Wt 92.1 kg   BMI 34.84 kg/m   GEN: Well nourished, well developed in no acute distress NECK: No JVD; No carotid bruits CARDIAC: Regular rate and rhythm, no murmurs, rubs,  gallops RESPIRATORY:  Clear to auscultation without rales, wheezing or rhonchi  ABDOMEN: Soft, non-tender, non-distended EXTREMITIES:  No edema; No deformity   Wt Readings from Last 3 Encounters:  01/15/23 92.1 kg  12/18/22 90.7 kg  08/28/22 92.5 kg     EKG today demonstrates  SR Vent. rate 76 BPM PR interval 152 ms QRS duration 82 ms QT/QTcB 408/459 ms  Echo 06/29/22 demonstrated  1. Left ventricular ejection fraction, by estimation, is 60 to 65%. The  left ventricle has normal function. The left ventricle has no regional  wall motion abnormalities. Left ventricular diastolic parameters are  indeterminate.   2. Right ventricular systolic function is normal. The right ventricular  size is normal. There is normal pulmonary artery systolic pressure. The  estimated right ventricular systolic pressure is 27.0 mmHg.   3. The mitral valve is normal in structure. Mild mitral valve  regurgitation. No evidence of mitral stenosis.   4. The aortic valve is tricuspid. Aortic valve regurgitation is not  visualized. No aortic stenosis is present.   5. The inferior vena cava is normal in size with greater than 50%  respiratory variability, suggesting right atrial pressure of 3 mmHg.    CHA2DS2-VASc Score = 4  The patient's score is based upon: CHF History: 0 HTN History: 1 Diabetes History: 0 Stroke History: 0 Vascular Disease History: 1 Age Score: 1 Gender Score: 1       ASSESSMENT AND PLAN: Persistent Atrial Fibrillation (ICD10:   I48.19) The patient's CHA2DS2-VASc score is 4, indicating a 4.8% annual risk of stroke.   S/p afib ablation 2010, afib and flutter 2018, repeat afib and flutter 12/18/22 Previously failed flecainide and Multaq Patient appears to be maintaining SR Continue warfarin Continue verapamil 160 mg BID  Secondary Hypercoagulable State (ICD10:  D68.69) The patient is at significant risk for stroke/thromboembolism based upon her CHA2DS2-VASc Score of 4.  Continue Warfarin (Coumadin).   HTN Stable on current regimen  OSA  Mild, did not tolerate CPAP historically  CAD CAC score 56.5 Intolerant of statins No anginal symptoms  Obesity Body mass index is 34.84 kg/m.  Encouraged lifestyle modification On Ozempic    Follow up with Canary Brim as scheduled.       Jorja Loa PA-C [facility] [address] [place], [place] [address] [phone]

## 2023-01-24 ENCOUNTER — Other Ambulatory Visit: Payer: Self-pay | Admitting: Cardiology

## 2023-01-24 DIAGNOSIS — I480 Paroxysmal atrial fibrillation: Secondary | ICD-10-CM

## 2023-01-28 ENCOUNTER — Ambulatory Visit: Payer: Medicare Other

## 2023-02-01 ENCOUNTER — Ambulatory Visit: Payer: Medicare Other | Attending: Cardiology | Admitting: *Deleted

## 2023-02-01 DIAGNOSIS — I4891 Unspecified atrial fibrillation: Secondary | ICD-10-CM | POA: Diagnosis present

## 2023-02-01 DIAGNOSIS — Z01810 Encounter for preprocedural cardiovascular examination: Secondary | ICD-10-CM | POA: Insufficient documentation

## 2023-02-01 DIAGNOSIS — Z5181 Encounter for therapeutic drug level monitoring: Secondary | ICD-10-CM | POA: Diagnosis not present

## 2023-02-01 DIAGNOSIS — I480 Paroxysmal atrial fibrillation: Secondary | ICD-10-CM

## 2023-02-01 LAB — POCT INR: INR: 2.4 (ref 2.0–3.0)

## 2023-02-01 NOTE — Patient Instructions (Signed)
Description   Continue taking warfarin 2 tablets daily except for 3 tablets on Tuesdays, Thursdays, and Saturdays. Recheck INR in 4 weeks.  Call Coumadin Clinic [phone]

## 2023-03-01 ENCOUNTER — Ambulatory Visit: Payer: Medicare Other | Attending: Cardiovascular Disease | Admitting: *Deleted

## 2023-03-01 DIAGNOSIS — Z5181 Encounter for therapeutic drug level monitoring: Secondary | ICD-10-CM | POA: Insufficient documentation

## 2023-03-01 DIAGNOSIS — I4891 Unspecified atrial fibrillation: Secondary | ICD-10-CM | POA: Diagnosis present

## 2023-03-01 DIAGNOSIS — I480 Paroxysmal atrial fibrillation: Secondary | ICD-10-CM | POA: Insufficient documentation

## 2023-03-01 LAB — POCT INR: INR: 2.7 (ref 2.0–3.0)

## 2023-03-01 NOTE — Patient Instructions (Signed)
Description   Continue taking warfarin 2 tablets daily except for 3 tablets on Tuesdays, Thursdays, and Saturdays. Recheck INR in 6 weeks.  Call Coumadin Clinic [phone]

## 2023-03-09 ENCOUNTER — Encounter: Payer: Self-pay | Admitting: Podiatry

## 2023-03-09 ENCOUNTER — Ambulatory Visit (INDEPENDENT_AMBULATORY_CARE_PROVIDER_SITE_OTHER): Payer: Medicare Other | Admitting: Podiatry

## 2023-03-09 DIAGNOSIS — M79674 Pain in right toe(s): Secondary | ICD-10-CM

## 2023-03-09 DIAGNOSIS — B351 Tinea unguium: Secondary | ICD-10-CM

## 2023-03-09 DIAGNOSIS — E1169 Type 2 diabetes mellitus with other specified complication: Secondary | ICD-10-CM

## 2023-03-09 DIAGNOSIS — M79675 Pain in left toe(s): Secondary | ICD-10-CM | POA: Diagnosis not present

## 2023-03-09 NOTE — Progress Notes (Signed)
This patient returns to my office for at risk foot care.  This patient requires this care by a professional since this patient will be at risk due to having coagulation defect.  Patient is taking coumadin.  This patient is unable to cut nails herself since the patient cannot reach her nails.These nails are painful walking and wearing shoes.  This patient presents for at risk foot care today.  General Appearance  Alert, conversant and in no acute stress.  Vascular  Dorsalis pedis and posterior tibial  pulses are palpable  bilaterally.  Capillary return is within normal limits  bilaterally. Temperature is within normal limits  bilaterally.  Neurologic  Senn-Weinstein monofilament wire test within normal limits  bilaterally. Muscle power within normal limits bilaterally.  Nails Thick disfigured discolored nails with subungual debris  Second left and first and third right foot.  No evidence of bacterial infection or drainage bilaterally.  Orthopedic  No limitations of motion  feet .  No crepitus or effusions noted.  No bony pathology or digital deformities noted.  Skin  normotropic skin with no porokeratosis noted bilaterally.  No signs of infections or ulcers noted.     Onychomycosis  Pain in right toes  Pain in left toes  Consent was obtained for treatment procedures.   Mechanical debridement of nails 1-5  bilaterally performed with a nail nipper.  Filed with dremel without incident.    Return office visit   9 weeks                  Told patient to return for periodic foot care and evaluation due to potential at risk complications.   Limmie Schoenberg DPM  

## 2023-03-19 ENCOUNTER — Ambulatory Visit: Payer: Medicare Other | Attending: Pulmonary Disease | Admitting: Physician Assistant

## 2023-03-19 NOTE — Progress Notes (Deleted)
Cardiology Office Note Date:  03/19/2023  Patient ID:  [name], DOB [birth year], MRN [id number] PCP:  Tally Joe, MD  Electrophysiologist: Dr. Lalla Brothers Cardiologist: Dr. Cristal Deer  Chief Complaint:  *** post ablation visit  History of Present Illness: [name] is a 67 y.o. female with history of RA, HTN, HLD, DVT (recurrent), AFib  She had a hospital stay 03/29/21 - 03/30/21 for CP, neg trops, nonischemic EKG, Kardia tracings without arrhythmia.  Coronary CT with minimal CAD, ca++ 68  She saw Dr. Lalla Brothers 09/09/21, doing well, no changes were made  She has seen cardiology team a few times, most recently Dr. Cristal Deer Oct 2023, again doing well, was post knee surgery, working on advancing activities, no changes were made.  I saw her 06/03/22 She has had an uptick in symptoms, particularly her palpitations. 2/13/ had about 4 hours of AFib (confirmed to her by [place] tracing) and recognized symptom, gets an ache across her back when in AFib 3 days ago had some pounding and caught about 8 seconds of V. Trigeminy (she is a retired [employer] fund[employer]) Last night had chest discomfort stabbing/pounding starts front rad to back, can last hours, took her PMD meds and some time later stopped, kardia noted NSR This AM on the way here had some chest discomfort again, caught one PVC on her kadia. Eased off here This CP/chest symptom that she has, goes back/pre-dates her coronary CT last year with no significant CAD and neg Trops.  This is reassuring. It does not seem especially connected to rhythm. I discussed she consider talking with her PMD for possible non-cardiac etiologies as well. She has some orthostatic dizziness Her symptoms, palpitations are anxiety provoking, she gets a little emotional today in discussing that historically not taken seriously. No bleeding or signs of bleeding Planned for labs, monitor and echo, started on Toprol Echo looked OK, preserved LVEF, monitor as  below Several mychart messages, discussion on symptoms, Afib, perhaps intolerant of Toprol, though HRs seemed OK Took extra verapamil   I saw her 08/10/22 She continues to have symptomatic AFib A couple weeks ago at church went into AFib, rates were OK, 57-69 or so, but makes her feel weak, lightheaded, lasted several hours, had to just go home and lay in bed. Her Afib had been well controlled until about 6-8 mo ago, started to flare back up No real trigger that she has noted. When not in AFib she feels well Metoprolol seemed to bother her asthma and generally made her feel unwell even when in SR. Planned to see Dr. America Brown ablation  Saw Dr. Lalla Brothers 08/28/22, planned for ablation  12/18/22 PVI ablation with PFA  *** INR/bleeding, [place] *** symptoms *** sites *** PVCs?   AFib hx AFib ablation at [facility] 2010, Dr. Sharlene Dory PVI and CTI ablation 05/07/2016  AAD Hx Flecainide dose limited with development of BBB > failed to maintain SR and stopped Dec 2017 Multaq stopped post PVI/CTA re-do ablation 2018, notes also report poorly tolerated (unclear), she does not recall ever taking it Metoprolol succ bothered her asthma, made her feel unwell PVI ablation 12/18/22 (PFA)  Past Medical History:  Diagnosis Date   Allergic rhinitis    Anginal pain (HCC)    Arthritis    "back" (05/07/2016)   Asthma    Atrial flutter (HCC)    a. Remotely per notes.   Carpal tunnel syndrome, bilateral    Colon polyps    Diverticulitis    Dizziness  DVT (deep venous thrombosis) (HCC) 1980s x2 -    between birth of two sons. Saw hematology - was told she has a hypercoagulable disorder, should be on Coumadin lifelong.   Dysrhythmia    atrial fib   Family history of adverse reaction to anesthesia    "mother gets PONV"   Gallbladder disease    GERD (gastroesophageal reflux disease)    GIB (gastrointestinal bleeding) 04/14/2018   Halothane adverse reaction    narrow small opening   Heart  murmur    Hiatal hernia    History of hiatal hernia    "small one/CTA 05/01/2016" (05/07/2016)   History of kidney stones    HTN (hypertension)    Hypercholesteremia    hx; "brought it down w/diet" (05/07/2016)   Hyperglycemia    a. A1c 6.1 in 2014.   Inguinal hernia    Iron deficiency anemia due to chronic blood loss 04/14/2018   Iron malabsorption 04/14/2018   Left sided sciatica    Normal cardiac stress test 06/2012   Obesity    OSA (obstructive sleep apnea)    a. Mild, did not tolerate CPAP (05/07/2016)   PAF (paroxysmal atrial fibrillation) (HCC)    a. s/p afib ablation at [facility] 2010 (Dr. Sharlene Dory). b. Recurrent AF s/p DCCV 06/2010. c. On flecainide.    Paroxysmal atrial fibrillation (HCC) 09/09/2009   Qualifier: Diagnosis of  By: Susette Racer CMA, Jewel     PONV (postoperative nausea and vomiting)    PPD positive, treated 1977   "treated for 1 yr after exposure to patient"   Pre-diabetes    RSV infection ~ 2006   Vertigo    Wandering (atrial) pacemaker    a. Remotely per notes.  (  pt. states has a wandering p wave ) no pacemaker    Past Surgical History:  Procedure Laterality Date   ANKLE ARTHODESIS W/ ARTHROSCOPY Right 11/12/2020   ATRIAL ABLATION SURGERY  05/07/2016   "fib and flutter"   ATRIAL FIBRILLATION ABLATION  2010   at [facility]   ATRIAL FIBRILLATION ABLATION N/A 05/07/2016   Procedure: Atrial Fibrillation Ablation;  Surgeon: Hillis Range, MD;  Location: [facility] INVASIVE CV LAB;  Service: Cardiovascular;  Laterality: N/A;   ATRIAL FIBRILLATION ABLATION N/A 12/18/2022   Procedure: ATRIAL FIBRILLATION ABLATION;  Surgeon: Lanier Prude, MD;  Location: [facility] INVASIVE CV LAB;  Service: Cardiovascular;  Laterality: N/A;   BREAST CYST ASPIRATION Bilateral    CARDIAC CATHETERIZATION  2008   CARPAL TUNNEL RELEASE Bilateral    CATARACT EXTRACTION Bilateral 2022   CESAREAN SECTION  1986; 1988   COLON RESECTION     COLONOSCOPY  X 2   COLONOSCOPY W/ BIOPSIES AND  POLYPECTOMY  X 2   CYSTOSCOPY/URETEROSCOPY/HOLMIUM LASER/STENT PLACEMENT Left 12/23/2019   Procedure: CYSTOSCOPY/URETEROSCOPY/HOLMIUM LASER/STENT PLACEMENT;  Surgeon: Noel Christmas, MD;  Location: [facility] ORS;  Service: Urology;  Laterality: Left;   ESOPHAGOGASTRODUODENOSCOPY     ESOPHAGOGASTRODUODENOSCOPY (EGD) WITH PROPOFOL N/A 07/19/2017   Procedure: ESOPHAGOGASTRODUODENOSCOPY (EGD) WITH PROPOFOL;  Surgeon: Benancio Deeds, MD;  Location: [facility] ENDOSCOPY;  Service: Gastroenterology;  Laterality: N/A;   INGUINAL HERNIA REPAIR Right    KNEE ARTHROSCOPY Right 06/10/2021   Procedure: RIGHT KNEE ARTHROSCOPY;  Surgeon: Marcene Corning, MD;  Location: [facility] OR[facility];  Service: Orthopedics;  Laterality: Right;   LAPAROSCOPIC CHOLECYSTECTOMY  1989   POLYPECTOMY N/A 07/19/2017   Procedure: POLYPECTOMY;  Surgeon: Benancio Deeds, MD;  Location: [facility];  Service: Gastroenterology;  Laterality: N/A;   PROCTOSCOPY  N/A 08/06/2017   Procedure: PROCTOSCOPY;  Surgeon: Karie Soda, MD;  Location: [facility];  Service: General;  Laterality: N/A;   TMJ ARTHROPLASTY     TOTAL KNEE ARTHROPLASTY Right 01/06/2022   Procedure: RIGHT TOTAL KNEE ARTHROPLASTY;  Surgeon: Marcene Corning, MD;  Location: [facility];  Service: Orthopedics;  Laterality: Right;   TUBAL LIGATION  1988    Current Outpatient Medications  Medication Sig Dispense Refill   acetaminophen (TYLENOL) 650 MG CR tablet Take 1,300 mg by mouth every 8 (eight) hours as needed for pain.      APPLE CIDER VINEGAR PO Take 450 mg by mouth in the morning.     cetirizine (ZYRTEC) 10 MG tablet Take 10 mg by mouth in the morning.     Cholecalciferol (VITAMIN D3) 50 MCG (2000 UT) capsule Take 2,000 Units by mouth in the morning.     colchicine 0.6 MG tablet Take 1 tablet (0.6 mg total) by mouth 2 (two) times daily for 5 days. 10 tablet 0   Continuous Blood Gluc Sensor (FREESTYLE LIBRE 2 SENSOR) MISC APPLY ONE SENSOR TO THE BACK OF THE UPPER ARM EVERY 14 DAYS      dicyclomine (BENTYL) 10 MG capsule TAKE 1 CAPSULE BY MOUTH 30 MINS BEFORE MEALS AND AT BEDTIME AS NEEDED FOR PAIN/CRAMPING 360 capsule 1   ezetimibe (ZETIA) 10 MG tablet TAKE 1 TABLET BY MOUTH EVERY DAY 90 tablet 3   FLOVENT HFA 220 MCG/ACT inhaler Inhale 1 puff into the lungs 2 (two) times daily.  1   fluticasone (FLONASE) 50 MCG/ACT nasal spray Place 1 spray into both nostrils 2 (two) times daily.      isosorbide mononitrate (IMDUR) 30 MG 24 hr tablet TAKE 1 TABLET BY MOUTH EVERY DAY 90 tablet 3   losartan (COZAAR) 25 MG tablet Take 1 tablet (25 mg total) by mouth daily. 90 tablet 0   meclizine (ANTIVERT) 25 MG tablet Take 1 tablet (25 mg total) by mouth 3 (three) times daily as needed for dizziness. 30 tablet 0   Multiple Vitamin (MULTIVITAMIN WITH MINERALS) TABS tablet Take 1 tablet by mouth in the morning.     nitroGLYCERIN (NITROSTAT) 0.4 MG SL tablet PLACE 1 TABLET UNDER THE TONGUE EVERY 5 MINUTES AS NEEDED. 25 tablet 11   omeprazole (PRILOSEC) 40 MG capsule TAKE 1 CAPSULE BY MOUTH DAILY, CAN INCREASE TO 2 TIMES DAILY IF NEEDED 180 capsule 0   OZEMPIC, 1 MG/DOSE, 4 MG/3ML SOPN Inject 1 mg into the skin once a week. Tuesdays     polyethylene glycol (MIRALAX) 17 g packet Take 17 g by mouth 2 (two) times daily. Titrate as needed (Patient taking differently: Take 17 g by mouth 2 (two) times daily as needed (constipation.).) 14 each 0   PROAIR HFA 108 (90 BASE) MCG/ACT inhaler Inhale 2 puffs into the lungs every 6 (six) hours as needed for wheezing or shortness of breath.      venlafaxine XR (EFFEXOR-XR) 37.5 MG 24 hr capsule Take 37.5 mg by mouth daily with breakfast.      verapamil (CALAN) 80 MG tablet Take 160 mg by mouth 2 (two) times daily.     warfarin (COUMADIN) 2 MG tablet TAKE 4MG  DAILY EXCEPT 6MG  ON TUESDAYS, THURSDAYS,AND SATURDAYS AS DIRECTED BY ANTICOAGULATION CLINIC 225 tablet 1   No current facility-administered medications for this visit.    Allergies:   Aspirin, Banana, Food,  Iodinated contrast media, Latex, Meperidine hcl, Peanut-containing drug products, Penicillins, Statins, Sulfonamide derivatives, Tape, Alfuzosin, Halothane,  Other, and Sulfamethoxazole   Social History:  The patient  reports that she has never smoked. She has been exposed to tobacco smoke. She has never used smokeless tobacco. She reports that she does not currently use alcohol. She reports that she does not use drugs.   Family History:  The patient's family history includes Colon polyps in her father; Diabetes in her maternal grandmother; Heart attack in her maternal grandmother, mother, and paternal grandfather; Hyperlipidemia in her father and maternal grandmother; Hypertension in her maternal grandfather, maternal grandmother, and mother; Lupus in her sister; Stroke in her maternal grandfather, mother, and paternal grandmother; Thyroid disease in her mother and sister.  ROS:  Please see the history of present illness.    All other systems are reviewed and otherwise negative.   PHYSICAL EXAM:  VS:  There were no vitals taken for this visit. BMI: There is no height or weight on file to calculate BMI. Well nourished, well developed, in no acute distress HEENT: normocephalic, atraumatic Neck: no JVD, carotid bruits or masses Cardiac:  *** RRR; no significant murmurs, no rubs, or gallops Lungs: ***  CTA b/l, no wheezing, rhonchi or rales Abd: soft, nontender MS: no deformity or atrophy Ext:  *** no edema Skin: warm and dry, no rash Neuro:  No gross deficits appreciated Psych: euthymic mood, full affect   EKG:  done today and reviewed by myself ***  12/18/22: EPS/ablation 1. Successful redo PVI (reisolation of the left lower pulmonary vein and junction of the right superior pulmonary vein and roof) 2. Successful ablation/isolation of the posterior wall 3.  Successful ablation of the cavotricuspid isthmus for typical atrial flutter 4. Intracardiac echo reveals trivial pericardial effusion  and normal left atrial architecture 5. No early apparent complications. 6. Colchicine 0.6mg  PO BID x 5 days 7. Protonix 40mg  PO daily x 45 days   12/11/22: Cardiac CT IMPRESSION: 1. There is normal pulmonary vein drainage into the left atrium with ostial measurements above. 2. There is no thrombus in the left atrial appendage. 3. The esophagus runs in the left atrial midline and is not in proximity to any of the pulmonary vein ostia. 4. No PFO/ASD. 5. Normal coronary origin. Right dominance. 6. CAC score of 56.5, which is 69th percentile for age-, race-, and sex-matched controls.   March 2024: monitor HR 46 - 143 bpm, average 69 bpm. 12 nonsustained SVT, longest 11 beats. 3% burden of atrial fibrillation, average rate 92 bpm. Longest epiosode 5hours Symptom trigger episodes correspond to AF. Rare supraventricular and ventricular ectopy.  07/19/22: TTE 1. Left ventricular ejection fraction, by estimation, is 60 to 65%. The  left ventricle has normal function. The left ventricle has no regional  wall motion abnormalities. Left ventricular diastolic parameters are  indeterminate.   2. Right ventricular systolic function is normal. The right ventricular  size is normal. There is normal pulmonary artery systolic pressure. The  estimated right ventricular systolic pressure is 27.0 mmHg.   3. The mitral valve is normal in structure. Mild mitral valve  regurgitation. No evidence of mitral stenosis.   4. The aortic valve is tricuspid. Aortic valve regurgitation is not  visualized. No aortic stenosis is present.   5. The inferior vena cava is normal in size with greater than 50%  respiratory variability, suggesting right atrial pressure of 3 mmHg.     04/09/21: Coronary CT IMPRESSION: 1. Coronary calcium score of 68. This was 47 percentile for age and sex matched control.   2.  Normal coronary origin with right dominance.   3. CAD-RADS 2. Mild non-obstructive CAD  (25-49%). Consider non-atherosclerotic causes of chest pain. Consider preventive therapy and risk factor modification.   03/14/21: TTE  1. Left ventricular ejection fraction, by estimation, is 60 to 65%. The  left ventricle has normal function. The left ventricle has no regional  wall motion abnormalities. Left ventricular diastolic parameters are  consistent with Grade II diastolic  dysfunction (pseudonormalization). Elevated left ventricular end-diastolic  pressure.   2. Right ventricular systolic function is normal. The right ventricular  size is normal.   3. Left atrial size was mild to moderately dilated.   4. The mitral valve is grossly normal. Mild mitral valve regurgitation.  No evidence of mitral stenosis.   5. The aortic valve is grossly normal. Aortic valve regurgitation is not  visualized. No aortic stenosis is present.   6. The inferior vena cava is normal in size with <50% respiratory  variability, suggesting right atrial pressure of 8 mmHg.   Comparison(s): No significant change from prior study.   05/07/2016; EPS/ablation CONCLUSIONS: 1. Sinus rhythm upon presentation.   2. Intracardiac echo reveals a moderate sized left atrium with four separate pulmonary veins without evidence of pulmonary vein stenosis. 3. Ep study demonstrated electrical activity within the right superior and inferior pulmonary veins at baseline.  There was exit conduction through the left inferior pulmonary vein.  The left superior pulmonary vein was quiescent from the prior ablation procedure.  4. Successful sequential electrical isolation and anatomical encircling of the left inferior, right superior and right inferior pulmonary veins.  A WACA approach was used along the right PVs.  The left inferior pulmonary vein was isolated along the anterior and inferior quadrant.    Extensive ablation was required along the carina between the RSPV and RIPV. 5. Cavo-tricuspid isthmus ablation was performed  with complete bidirectional isthmus block achieved.  6. No inducible arrhythmias following ablation both on and off of Isuprel 7. No early apparent complications.  Recent Labs: 06/03/2022: Magnesium 2.1 12/04/2022: BUN 11; Creatinine, Ser 0.71; Hemoglobin 13.6; Platelets 353; Potassium 4.3; Sodium 137  No results found for requested labs within last 365 days.   CrCl cannot be calculated (Patient's most recent lab result is older than the maximum 21 days allowed.).   Wt Readings from Last 3 Encounters:  01/15/23 203 lb (92.1 kg)  12/18/22 200 lb (90.7 kg)  08/28/22 204 lb (92.5 kg)     Other studies reviewed: Additional studies/records reviewed today include: summarized above  ASSESSMENT AND PLAN:  Paroxysmal Afib CHA2DS2Vasc is 5, on warfarin, *** monitored and managed with [place]   HTN Looks ok 3. Postural lightheadedness chronic and unchanged *** No syncope ***  4. CP CTA with no obstructive CAD though exertional angina, not unstable Chronic sounding, predates her coronary CT C/w cardiology team *** Not an active complaint today  5. She has observed PVCs Rare on her monitor  6. Secondary hypercoagulable state    Disposition: ***   Current medicines are reviewed at length with the patient today.  The patient did not have any concerns regarding medicines.  Norma Fredrickson, PA-C 03/19/2023 7:21 AM     [facility] [address] [place] [address] [phone] (office)  [phone] (fax)

## 2023-03-29 NOTE — Progress Notes (Signed)
 Cardiology Office Note Date:  03/29/2023  Patient ID:  [name], DOB 06-01-1955, MRN [id number] PCP:  Seabron Lenis, MD  Electrophysiologist: Dr. Cindie Cardiologist: Dr. Lonni  Chief Complaint:   post ablation visit  History of Present Illness: [name] is a 68 y.o. female with history of RA, HTN, HLD, DVT (recurrent), AFib  She had a hospital stay 03/29/21 - 03/30/21 for CP, neg trops, nonischemic EKG, Kardia tracings without arrhythmia.  Coronary CT with minimal CAD, ca++ 68  She saw Dr. Cindie 09/09/21, doing well, no changes were made  She has seen cardiology team a few times, most recently Dr. Lonni Oct 2023, again doing well, was post knee surgery, working on advancing activities, no changes were made.  I saw her 06/03/22 She has had an uptick in symptoms, particularly her palpitations. 2/13/ had about 4 hours of AFib (confirmed to her by kardia tracing) and recognized symptom, gets an ache across her back when in AFib 3 days ago had some pounding and caught about 8 seconds of V. Trigeminy (she is a retired [employer]) Last night had chest discomfort stabbing/pounding starts front rad to back, can last hours, took her PMD meds and some time later stopped, kardia noted NSR This AM on the way here had some chest discomfort again, caught one PVC on her kadia. Eased off here This CP/chest symptom that she has, goes back/pre-dates her coronary CT last year with no significant CAD and neg Trops.  This is reassuring. It does not seem especially connected to rhythm. I discussed she consider talking with her PMD for possible non-cardiac etiologies as well. She has some orthostatic dizziness Her symptoms, palpitations are anxiety provoking, she gets a little emotional today in discussing that historically not taken seriously. No bleeding or signs of bleeding Planned for labs, monitor and echo, started on Toprol  Echo looked OK, preserved LVEF, monitor as below Several  mychart messages, discussion on symptoms, Afib, perhaps intolerant of Toprol , though HRs seemed OK Took extra verapamil    I saw her 08/10/22 She continues to have symptomatic AFib A couple weeks ago at church went into AFib, rates were OK, 57-69 or so, but makes her feel weak, lightheaded, lasted several hours, had to just go home and lay in bed. Her Afib had been well controlled until about 6-8 mo ago, started to flare back up No real trigger that she has noted. When not in AFib she feels well Metoprolol  seemed to bother her asthma and generally made her feel unwell even when in SR. Planned to see Dr. Kee ablation  Saw Dr. Cindie 08/28/22, planned for ablation  12/18/22 PVI ablation with PFA  TODAY She is doing quite well No CP, SOB No AFib or symptoms of her Afib (palpitations, weakness, lightheaded) Infrequently feel like she has some PVCs (these are a sense of a missed beat and different then her Afib symptoms) No dizzy spells, near syncope or syncope No bleeding or signs of bleeding  No procedural concerns/symptoms, groins healed up well She asks about stopping Imdur    AFib hx AFib ablation at [facility] 2010, Dr. Arlon PVI and CTI ablation 05/07/2016  AAD Hx Flecainide  dose limited with development of BBB > failed to maintain SR and stopped Dec 2017 Multaq  stopped post PVI/CTA re-do ablation 2018, notes also report poorly tolerated (unclear), she does not recall ever taking it Metoprolol  succ bothered her asthma, made her feel unwell PVI ablation 12/18/22 (PFA)  Past Medical History:  Diagnosis Date  Allergic rhinitis    Anginal pain (HCC)    Arthritis    back (05/07/2016)   Asthma    Atrial flutter (HCC)    a. Remotely per notes.   Carpal tunnel syndrome, bilateral    Colon polyps    Diverticulitis    Dizziness    DVT (deep venous thrombosis) (HCC) 1980s x2 -    between birth of two sons. Saw hematology - was told she has a hypercoagulable  disorder, should be on Coumadin  lifelong.   Dysrhythmia    atrial fib   Family history of adverse reaction to anesthesia    mother gets PONV   Gallbladder disease    GERD (gastroesophageal reflux disease)    GIB (gastrointestinal bleeding) 04/14/2018   Halothane adverse reaction    narrow small opening   Heart murmur    Hiatal hernia    History of hiatal hernia    small one/CTA 05/01/2016 (05/07/2016)   History of kidney stones    HTN (hypertension)    Hypercholesteremia    hx; brought it down w/diet (05/07/2016)   Hyperglycemia    a. A1c 6.1 in 2014.   Inguinal hernia    Iron  deficiency anemia due to chronic blood loss 04/14/2018   Iron  malabsorption 04/14/2018   Left sided sciatica    Normal cardiac stress test 06/2012   Obesity    OSA (obstructive sleep apnea)    a. Mild, did not tolerate CPAP (05/07/2016)   PAF (paroxysmal atrial fibrillation) (HCC)    a. s/p afib ablation at [facility] 2010 (Dr. Arlon). b. Recurrent AF s/p DCCV 06/2010. c. On flecainide .    Paroxysmal atrial fibrillation (HCC) 09/09/2009   Qualifier: Diagnosis of  By: Lawernce CMA, Jewel     PONV (postoperative nausea and vomiting)    PPD positive, treated 1977   treated for 1 yr after exposure to patient   Pre-diabetes    RSV infection ~ 2006   Vertigo    Wandering (atrial) pacemaker    a. Remotely per notes.  (  pt. states has a wandering p wave ) no pacemaker    Past Surgical History:  Procedure Laterality Date   ANKLE ARTHODESIS W/ ARTHROSCOPY Right 11/12/2020   ATRIAL ABLATION SURGERY  05/07/2016   fib and flutter   ATRIAL FIBRILLATION ABLATION  2010   at [facility]   ATRIAL FIBRILLATION ABLATION N/A 05/07/2016   Procedure: Atrial Fibrillation Ablation;  Surgeon: Lynwood Rakers, MD;  Location: [facility] INVASIVE CV LAB;  Service: Cardiovascular;  Laterality: N/A;   ATRIAL FIBRILLATION ABLATION N/A 12/18/2022   Procedure: ATRIAL FIBRILLATION ABLATION;  Surgeon: Cindie Ole DASEN, MD;   Location: [facility] INVASIVE CV LAB;  Service: Cardiovascular;  Laterality: N/A;   BREAST CYST ASPIRATION Bilateral    CARDIAC CATHETERIZATION  2008   CARPAL TUNNEL RELEASE Bilateral    CATARACT EXTRACTION Bilateral 2022   CESAREAN SECTION  1986; 1988   COLON RESECTION     COLONOSCOPY  X 2   COLONOSCOPY W/ BIOPSIES AND POLYPECTOMY  X 2   CYSTOSCOPY/URETEROSCOPY/HOLMIUM LASER/STENT PLACEMENT Left 12/23/2019   Procedure: CYSTOSCOPY/URETEROSCOPY/HOLMIUM LASER/STENT PLACEMENT;  Surgeon: Elisabeth Valli BIRCH, MD;  Location: [facility] ORS;  Service: Urology;  Laterality: Left;   ESOPHAGOGASTRODUODENOSCOPY     ESOPHAGOGASTRODUODENOSCOPY (EGD) WITH PROPOFOL  N/A 07/19/2017   Procedure: ESOPHAGOGASTRODUODENOSCOPY (EGD) WITH PROPOFOL ;  Surgeon: Leigh Elspeth SQUIBB, MD;  Location: [facility] END[facility];  Service: Gastroenterology;  Laterality: N/A;   INGUINAL HERNIA REPAIR Right    KNEE ARTHROSCOPY Right 06/10/2021  Procedure: RIGHT KNEE ARTHROSCOPY;  Surgeon: Sheril Coy, MD;  Location: [facility];  Service: Orthopedics;  Laterality: Right;   LAPAROSCOPIC CHOLECYSTECTOMY  1989   POLYPECTOMY N/A 07/19/2017   Procedure: POLYPECTOMY;  Surgeon: Leigh Elspeth SQUIBB, MD;  Location: [facility];  Service: Gastroenterology;  Laterality: N/A;   PROCTOSCOPY N/A 08/06/2017   Procedure: PROCTOSCOPY;  Surgeon: Sheldon Elspeth, MD;  Location: [facility];  Service: General;  Laterality: N/A;   TMJ ARTHROPLASTY     TOTAL KNEE ARTHROPLASTY Right 01/06/2022   Procedure: RIGHT TOTAL KNEE ARTHROPLASTY;  Surgeon: Sheril Coy, MD;  Location: [facility];  Service: Orthopedics;  Laterality: Right;   TUBAL LIGATION  1988    Current Outpatient Medications  Medication Sig Dispense Refill   acetaminophen  (TYLENOL ) 650 MG CR tablet Take 1,300 mg by mouth every 8 (eight) hours as needed for pain.      APPLE CIDER VINEGAR PO Take 450 mg by mouth in the morning.     cetirizine (ZYRTEC) 10 MG tablet Take 10 mg by mouth in the morning.     Cholecalciferol   (VITAMIN D3) 50 MCG (2000 UT) capsule Take 2,000 Units by mouth in the morning.     colchicine  0.6 MG tablet Take 1 tablet (0.6 mg total) by mouth 2 (two) times daily for 5 days. 10 tablet 0   Continuous Blood Gluc Sensor (FREESTYLE LIBRE 2 SENSOR) MISC APPLY ONE SENSOR TO THE BACK OF THE UPPER ARM EVERY 14 DAYS     dicyclomine  (BENTYL ) 10 MG capsule TAKE 1 CAPSULE BY MOUTH 30 MINS BEFORE MEALS AND AT BEDTIME AS NEEDED FOR PAIN/CRAMPING 360 capsule 1   ezetimibe  (ZETIA ) 10 MG tablet TAKE 1 TABLET BY MOUTH EVERY DAY 90 tablet 3   FLOVENT  HFA 220 MCG/ACT inhaler Inhale 1 puff into the lungs 2 (two) times daily.  1   fluticasone  (FLONASE ) 50 MCG/ACT nasal spray Place 1 spray into both nostrils 2 (two) times daily.      isosorbide  mononitrate (IMDUR ) 30 MG 24 hr tablet TAKE 1 TABLET BY MOUTH EVERY DAY 90 tablet 3   losartan  (COZAAR ) 25 MG tablet Take 1 tablet (25 mg total) by mouth daily. 90 tablet 0   meclizine  (ANTIVERT ) 25 MG tablet Take 1 tablet (25 mg total) by mouth 3 (three) times daily as needed for dizziness. 30 tablet 0   Multiple Vitamin (MULTIVITAMIN WITH MINERALS) TABS tablet Take 1 tablet by mouth in the morning.     nitroGLYCERIN  (NITROSTAT ) 0.4 MG SL tablet PLACE 1 TABLET UNDER THE TONGUE EVERY 5 MINUTES AS NEEDED. 25 tablet 11   omeprazole  (PRILOSEC) 40 MG capsule TAKE 1 CAPSULE BY MOUTH DAILY, CAN INCREASE TO 2 TIMES DAILY IF NEEDED 180 capsule 0   OZEMPIC, 1 MG/DOSE, 4 MG/3ML SOPN Inject 1 mg into the skin once a week. Tuesdays     polyethylene glycol (MIRALAX ) 17 g packet Take 17 g by mouth 2 (two) times daily. Titrate as needed (Patient taking differently: Take 17 g by mouth 2 (two) times daily as needed (constipation.).) 14 each 0   PROAIR  HFA 108 (90 BASE) MCG/ACT inhaler Inhale 2 puffs into the lungs every 6 (six) hours as needed for wheezing or shortness of breath.      venlafaxine  XR (EFFEXOR -XR) 37.5 MG 24 hr capsule Take 37.5 mg by mouth daily with breakfast.      verapamil   (CALAN ) 80 MG tablet Take 160 mg by mouth 2 (two) times daily.     warfarin (COUMADIN ) 2  MG tablet TAKE 4MG  DAILY EXCEPT 6MG  ON TUESDAYS, THURSDAYS,AND SATURDAYS AS DIRECTED BY ANTICOAGULATION CLINIC 225 tablet 1   No current facility-administered medications for this visit.    Allergies:   Aspirin, Banana, Food, Iodinated contrast media, Latex, Meperidine  hcl, Peanut-containing drug products, Penicillins, Statins, Sulfonamide derivatives, Tape, Alfuzosin, Halothane, Other, and Sulfamethoxazole   Social History:  The patient  reports that she has never smoked. She has been exposed to tobacco smoke. She has never used smokeless tobacco. She reports that she does not currently use alcohol. She reports that she does not use drugs.   Family History:  The patient's family history includes Colon polyps in her father; Diabetes in her maternal grandmother; Heart attack in her maternal grandmother, mother, and paternal grandfather; Hyperlipidemia in her father and maternal grandmother; Hypertension in her maternal grandfather, maternal grandmother, and mother; Lupus in her sister; Stroke in her maternal grandfather, mother, and paternal grandmother; Thyroid  disease in her mother and sister.  ROS:  Please see the history of present illness.    All other systems are reviewed and otherwise negative.   PHYSICAL EXAM:  VS:  There were no vitals taken for this visit. BMI: There is no height or weight on file to calculate BMI. Well nourished, well developed, in no acute distress HEENT: normocephalic, atraumatic Neck: no JVD, carotid bruits or masses Cardiac:   RRR; no significant murmurs, no rubs, or gallops Lungs:  CTA b/l, no wheezing, rhonchi or rales Abd: soft, nontender MS: no deformity or atrophy Ext:  no edema Skin: warm and dry, no rash Neuro:  No gross deficits appreciated Psych: euthymic mood, full affect   EKG:  done today and reviewed by myself SR 67bpm, unchanged from prior  12/18/22:  EPS/ablation 1. Successful redo PVI (reisolation of the left lower pulmonary vein and junction of the right superior pulmonary vein and roof) 2. Successful ablation/isolation of the posterior wall 3.  Successful ablation of the cavotricuspid isthmus for typical atrial flutter 4. Intracardiac echo reveals trivial pericardial effusion and normal left atrial architecture 5. No early apparent complications. 6. Colchicine  0.6mg  PO BID x 5 days 7. Protonix  40mg  PO daily x 45 days   12/11/22: Cardiac CT IMPRESSION: 1. There is normal pulmonary vein drainage into the left atrium with ostial measurements above. 2. There is no thrombus in the left atrial appendage. 3. The esophagus runs in the left atrial midline and is not in proximity to any of the pulmonary vein ostia. 4. No PFO/ASD. 5. Normal coronary origin. Right dominance. 6. CAC score of 56.5, which is 69th percentile for age-, race-, and sex-matched controls.   March 2024: monitor HR 46 - 143 bpm, average 69 bpm. 12 nonsustained SVT, longest 11 beats. 3% burden of atrial fibrillation, average rate 92 bpm. Longest epiosode 5hours Symptom trigger episodes correspond to AF. Rare supraventricular and ventricular ectopy.  07/19/22: TTE 1. Left ventricular ejection fraction, by estimation, is 60 to 65%. The  left ventricle has normal function. The left ventricle has no regional  wall motion abnormalities. Left ventricular diastolic parameters are  indeterminate.   2. Right ventricular systolic function is normal. The right ventricular  size is normal. There is normal pulmonary artery systolic pressure. The  estimated right ventricular systolic pressure is 27.0 mmHg.   3. The mitral valve is normal in structure. Mild mitral valve  regurgitation. No evidence of mitral stenosis.   4. The aortic valve is tricuspid. Aortic valve regurgitation is not  visualized. No aortic  stenosis is present.   5. The inferior vena cava is  normal in size with greater than 50%  respiratory variability, suggesting right atrial pressure of 3 mmHg.     04/09/21: Coronary CT IMPRESSION: 1. Coronary calcium  score of 68. This was 15 percentile for age and sex matched control.   2. Normal coronary origin with right dominance.   3. CAD-RADS 2. Mild non-obstructive CAD (25-49%). Consider non-atherosclerotic causes of chest pain. Consider preventive therapy and risk factor modification.   03/14/21: TTE  1. Left ventricular ejection fraction, by estimation, is 60 to 65%. The  left ventricle has normal function. The left ventricle has no regional  wall motion abnormalities. Left ventricular diastolic parameters are  consistent with Grade II diastolic  dysfunction (pseudonormalization). Elevated left ventricular end-diastolic  pressure.   2. Right ventricular systolic function is normal. The right ventricular  size is normal.   3. Left atrial size was mild to moderately dilated.   4. The mitral valve is grossly normal. Mild mitral valve regurgitation.  No evidence of mitral stenosis.   5. The aortic valve is grossly normal. Aortic valve regurgitation is not  visualized. No aortic stenosis is present.   6. The inferior vena cava is normal in size with <50% respiratory  variability, suggesting right atrial pressure of 8 mmHg.   Comparison(s): No significant change from prior study.   05/07/2016; EPS/ablation CONCLUSIONS: 1. Sinus rhythm upon presentation.   2. Intracardiac echo reveals a moderate sized left atrium with four separate pulmonary veins without evidence of pulmonary vein stenosis. 3. Ep study demonstrated electrical activity within the right superior and inferior pulmonary veins at baseline.  There was exit conduction through the left inferior pulmonary vein.  The left superior pulmonary vein was quiescent from the prior ablation procedure.  4. Successful sequential electrical isolation and anatomical encircling of  the left inferior, right superior and right inferior pulmonary veins.  A WACA approach was used along the right PVs.  The left inferior pulmonary vein was isolated along the anterior and inferior quadrant.    Extensive ablation was required along the carina between the RSPV and RIPV. 5. Cavo-tricuspid isthmus ablation was performed with complete bidirectional isthmus block achieved.  6. No inducible arrhythmias following ablation both on and off of Isuprel  7. No early apparent complications.  Recent Labs: 06/03/2022: Magnesium  2.1 12/04/2022: BUN 11; Creatinine, Ser 0.71; Hemoglobin 13.6; Platelets 353; Potassium 4.3; Sodium 137  No results found for requested labs within last 365 days.   CrCl cannot be calculated (Patient's most recent lab result is older than the maximum 21 days allowed.).   Wt Readings from Last 3 Encounters:  01/15/23 203 lb (92.1 kg)  12/18/22 200 lb (90.7 kg)  08/28/22 204 lb (92.5 kg)     Other studies reviewed: Additional studies/records reviewed today include: summarized above  ASSESSMENT AND PLAN:  Paroxysmal Afib CHA2DS2Vasc is 5, on warfarin, monitored and managed with us    HTN Looks ok 3. Postural lightheadedness chronic and unchanged No syncope   4. CP CTA with no obstructive CAD though exertional angina, not unstable Chronic sounding, predates her coronary CT C/w cardiology team Not an active complaint today, she would like to try off Imdur  Will stop it, if recurrent CP she will resume (?microvascular)  5. She has observed PVCs Rare on her monitor  6. Secondary hypercoagulable state    Disposition: back again in 54mo, sooner if needed   Current medicines are reviewed at length with the patient  today.  The patient did not have any concerns regarding medicines.  Bonney Charlies Arthur, PA-C 03/29/2023 8:19 AM     [facility] [address] [place] [address] [phone] (office)  [phone] (fax)

## 2023-03-31 ENCOUNTER — Ambulatory Visit: Payer: Medicare Other | Attending: Physician Assistant | Admitting: Physician Assistant

## 2023-03-31 ENCOUNTER — Encounter: Payer: Self-pay | Admitting: Physician Assistant

## 2023-03-31 VITALS — BP 134/68 | HR 67 | Ht 64.0 in | Wt 199.2 lb

## 2023-03-31 DIAGNOSIS — I480 Paroxysmal atrial fibrillation: Secondary | ICD-10-CM | POA: Insufficient documentation

## 2023-03-31 DIAGNOSIS — I10 Essential (primary) hypertension: Secondary | ICD-10-CM | POA: Diagnosis present

## 2023-03-31 DIAGNOSIS — I493 Ventricular premature depolarization: Secondary | ICD-10-CM | POA: Insufficient documentation

## 2023-03-31 DIAGNOSIS — D6869 Other thrombophilia: Secondary | ICD-10-CM | POA: Insufficient documentation

## 2023-03-31 NOTE — Patient Instructions (Signed)
 Medication Instructions:    STOP TAKING:  ISOSORBIDE    *If you need a refill on your cardiac medications before your next appointment, please call your pharmacy*   Lab Work: NONE ORDERED  TODAY    If you have labs (blood work) drawn today and your tests are completely normal, you will receive your results only by: MyChart Message (if you have MyChart) OR A paper copy in the mail If you have any lab test that is abnormal or we need to change your treatment, we will call you to review the results.   Testing/Procedures: NONE ORDERED  TODAY     Follow-Up: At [facility], you and your health needs are our priority.  As part of our continuing mission to provide you with exceptional heart care, we have created designated Provider Care Teams.  These Care Teams include your primary Cardiologist (physician) and Advanced Practice Providers (APPs -  Physician Assistants and Nurse Practitioners) who all work together to provide you with the care you need, when you need it.  We recommend signing up for the patient portal called MyChart.  Sign up information is provided on this After Visit Summary.  MyChart is used to connect with patients for Virtual Visits (Telemedicine).  Patients are able to view lab/test results, encounter notes, upcoming appointments, etc.  Non-urgent messages can be sent to your provider as well.   To learn more about what you can do with MyChart, go to [email or web address].    Your next appointment:   6 month(s)  Provider:   Ole Holts, MD or Charlies Arthur, PA-C    Other Instructions

## 2023-04-12 ENCOUNTER — Ambulatory Visit: Payer: Medicare Other | Attending: Cardiovascular Disease

## 2023-04-12 DIAGNOSIS — I4891 Unspecified atrial fibrillation: Secondary | ICD-10-CM | POA: Diagnosis present

## 2023-04-12 DIAGNOSIS — I480 Paroxysmal atrial fibrillation: Secondary | ICD-10-CM | POA: Diagnosis present

## 2023-04-12 DIAGNOSIS — Z01810 Encounter for preprocedural cardiovascular examination: Secondary | ICD-10-CM | POA: Insufficient documentation

## 2023-04-12 DIAGNOSIS — Z5181 Encounter for therapeutic drug level monitoring: Secondary | ICD-10-CM | POA: Insufficient documentation

## 2023-04-12 LAB — POCT INR: INR: 2.6 (ref 2.0–3.0)

## 2023-04-12 NOTE — Patient Instructions (Signed)
Continue taking warfarin 2 tablets daily except for 3 tablets on Tuesdays, Thursdays, and Saturdays. Recheck INR in 6 weeks.  Call Coumadin Clinic [phone]

## 2023-05-11 ENCOUNTER — Encounter: Payer: Self-pay | Admitting: Family

## 2023-05-11 ENCOUNTER — Encounter: Payer: Self-pay | Admitting: Podiatry

## 2023-05-11 ENCOUNTER — Ambulatory Visit (INDEPENDENT_AMBULATORY_CARE_PROVIDER_SITE_OTHER): Payer: Medicare Other | Admitting: Podiatry

## 2023-05-11 DIAGNOSIS — M79675 Pain in left toe(s): Secondary | ICD-10-CM | POA: Diagnosis not present

## 2023-05-11 DIAGNOSIS — B351 Tinea unguium: Secondary | ICD-10-CM

## 2023-05-11 DIAGNOSIS — E1169 Type 2 diabetes mellitus with other specified complication: Secondary | ICD-10-CM | POA: Diagnosis not present

## 2023-05-11 DIAGNOSIS — M79674 Pain in right toe(s): Secondary | ICD-10-CM

## 2023-05-11 NOTE — Progress Notes (Signed)
This patient returns to my office for at risk foot care.  This patient requires this care by a professional since this patient will be at risk due to having coagulation defect.  Patient is taking coumadin.  This patient is unable to cut nails herself since the patient cannot reach her nails.These nails are painful walking and wearing shoes.  This patient presents for at risk foot care today.  General Appearance  Alert, conversant and in no acute stress.  Vascular  Dorsalis pedis and posterior tibial  pulses are palpable  bilaterally.  Capillary return is within normal limits  bilaterally. Temperature is within normal limits  bilaterally.  Neurologic  Senn-Weinstein monofilament wire test within normal limits  bilaterally. Muscle power within normal limits bilaterally.  Nails Thick disfigured discolored nails with subungual debris  Second left and first and third right foot.  No evidence of bacterial infection or drainage bilaterally.  Orthopedic  No limitations of motion  feet .  No crepitus or effusions noted.  No bony pathology or digital deformities noted.  Skin  normotropic skin with no porokeratosis noted bilaterally.  No signs of infections or ulcers noted.     Onychomycosis  Pain in right toes  Pain in left toes  Consent was obtained for treatment procedures.   Mechanical debridement of nails 1-5  bilaterally performed with a nail nipper.  Filed with dremel without incident.    Return office visit   9 weeks                  Told patient to return for periodic foot care and evaluation due to potential at risk complications.   Limmie Schoenberg DPM  

## 2023-05-17 ENCOUNTER — Encounter: Payer: Self-pay | Admitting: Family

## 2023-05-24 ENCOUNTER — Ambulatory Visit: Payer: Medicare Other | Attending: Cardiology | Admitting: *Deleted

## 2023-05-24 DIAGNOSIS — Z01810 Encounter for preprocedural cardiovascular examination: Secondary | ICD-10-CM | POA: Insufficient documentation

## 2023-05-24 DIAGNOSIS — I480 Paroxysmal atrial fibrillation: Secondary | ICD-10-CM | POA: Diagnosis present

## 2023-05-24 DIAGNOSIS — I4891 Unspecified atrial fibrillation: Secondary | ICD-10-CM | POA: Diagnosis present

## 2023-05-24 DIAGNOSIS — Z5181 Encounter for therapeutic drug level monitoring: Secondary | ICD-10-CM

## 2023-05-24 LAB — POCT INR: INR: 3.5 — AB (ref 2.0–3.0)

## 2023-05-24 NOTE — Patient Instructions (Addendum)
 Description   Do not take any warfarin today then continue taking warfarin 2 tablets daily except for 3 tablets on Tuesdays, Thursdays, and Saturdays. Recheck INR in 4 weeks.  Call Coumadin Clinic [phone]

## 2023-06-11 ENCOUNTER — Telehealth: Payer: Self-pay

## 2023-06-11 NOTE — Telephone Encounter (Signed)
 I received a call from patient stating that she will be on Prednisone and Doxycycline so I recommended decreasing her Coumadin to 2 tablets daily until her visit 3/31 and to eat more greens on those days.  She verbalized understanding

## 2023-06-21 ENCOUNTER — Ambulatory Visit: Attending: Cardiology

## 2023-06-21 DIAGNOSIS — I480 Paroxysmal atrial fibrillation: Secondary | ICD-10-CM | POA: Diagnosis present

## 2023-06-21 DIAGNOSIS — Z01810 Encounter for preprocedural cardiovascular examination: Secondary | ICD-10-CM | POA: Diagnosis present

## 2023-06-21 DIAGNOSIS — I4891 Unspecified atrial fibrillation: Secondary | ICD-10-CM | POA: Diagnosis present

## 2023-06-21 DIAGNOSIS — Z5181 Encounter for therapeutic drug level monitoring: Secondary | ICD-10-CM | POA: Diagnosis not present

## 2023-06-21 LAB — POCT INR: INR: 3.6 — AB (ref 2.0–3.0)

## 2023-06-21 NOTE — Patient Instructions (Signed)
 Take only 1 tablet today then  continue taking warfarin 2 tablets daily except for 3 tablets on Tuesdays, Thursdays, and Saturdays. Recheck INR in 3 weeks.  Call Coumadin Clinic [phone]

## 2023-07-12 ENCOUNTER — Encounter: Payer: Self-pay | Admitting: Family

## 2023-07-12 ENCOUNTER — Ambulatory Visit: Attending: Cardiovascular Disease

## 2023-07-12 DIAGNOSIS — I4891 Unspecified atrial fibrillation: Secondary | ICD-10-CM | POA: Insufficient documentation

## 2023-07-12 DIAGNOSIS — I480 Paroxysmal atrial fibrillation: Secondary | ICD-10-CM | POA: Diagnosis present

## 2023-07-12 DIAGNOSIS — Z5181 Encounter for therapeutic drug level monitoring: Secondary | ICD-10-CM | POA: Diagnosis present

## 2023-07-12 LAB — POCT INR: INR: 2.5 (ref 2.0–3.0)

## 2023-07-12 NOTE — Patient Instructions (Signed)
 Description   Continue taking warfarin 2 tablets daily except for 3 tablets on Tuesdays, Thursdays, and Saturdays. Recheck INR in 4 weeks.  Call Coumadin Clinic [phone]

## 2023-07-13 ENCOUNTER — Encounter: Payer: Self-pay | Admitting: Podiatry

## 2023-07-13 ENCOUNTER — Ambulatory Visit (INDEPENDENT_AMBULATORY_CARE_PROVIDER_SITE_OTHER): Payer: Medicare Other | Admitting: Podiatry

## 2023-07-13 DIAGNOSIS — M79674 Pain in right toe(s): Secondary | ICD-10-CM

## 2023-07-13 DIAGNOSIS — M79675 Pain in left toe(s): Secondary | ICD-10-CM | POA: Diagnosis not present

## 2023-07-13 DIAGNOSIS — E1169 Type 2 diabetes mellitus with other specified complication: Secondary | ICD-10-CM | POA: Diagnosis not present

## 2023-07-13 DIAGNOSIS — B351 Tinea unguium: Secondary | ICD-10-CM | POA: Diagnosis not present

## 2023-07-13 NOTE — Progress Notes (Signed)
This patient returns to my office for at risk foot care.  This patient requires this care by a professional since this patient will be at risk due to having coagulation defect.  Patient is taking coumadin.  This patient is unable to cut nails herself since the patient cannot reach her nails.These nails are painful walking and wearing shoes.  This patient presents for at risk foot care today.  General Appearance  Alert, conversant and in no acute stress.  Vascular  Dorsalis pedis and posterior tibial  pulses are palpable  bilaterally.  Capillary return is within normal limits  bilaterally. Temperature is within normal limits  bilaterally.  Neurologic  Senn-Weinstein monofilament wire test within normal limits  bilaterally. Muscle power within normal limits bilaterally.  Nails Thick disfigured discolored nails with subungual debris  Second left and first and third right foot.  No evidence of bacterial infection or drainage bilaterally.  Orthopedic  No limitations of motion  feet .  No crepitus or effusions noted.  No bony pathology or digital deformities noted.  Skin  normotropic skin with no porokeratosis noted bilaterally.  No signs of infections or ulcers noted.     Onychomycosis  Pain in right toes  Pain in left toes  Consent was obtained for treatment procedures.   Mechanical debridement of nails 1-5  bilaterally performed with a nail nipper.  Filed with dremel without incident.    Return office visit   9 weeks                  Told patient to return for periodic foot care and evaluation due to potential at risk complications.   Limmie Schoenberg DPM  

## 2023-07-26 ENCOUNTER — Other Ambulatory Visit: Payer: Self-pay | Admitting: Cardiology

## 2023-07-26 ENCOUNTER — Other Ambulatory Visit: Payer: Self-pay | Admitting: Internal Medicine

## 2023-07-26 DIAGNOSIS — I480 Paroxysmal atrial fibrillation: Secondary | ICD-10-CM

## 2023-08-09 ENCOUNTER — Encounter

## 2023-08-20 ENCOUNTER — Ambulatory Visit: Attending: Cardiology

## 2023-08-20 DIAGNOSIS — Z5181 Encounter for therapeutic drug level monitoring: Secondary | ICD-10-CM | POA: Insufficient documentation

## 2023-08-20 DIAGNOSIS — I480 Paroxysmal atrial fibrillation: Secondary | ICD-10-CM | POA: Diagnosis present

## 2023-08-20 DIAGNOSIS — I4891 Unspecified atrial fibrillation: Secondary | ICD-10-CM | POA: Diagnosis present

## 2023-08-20 LAB — POCT INR: INR: 2.7 (ref 2.0–3.0)

## 2023-08-20 NOTE — Patient Instructions (Signed)
 Description   Continue taking warfarin 2 tablets daily except for 3 tablets on Tuesdays, Thursdays, and Saturdays. Recheck INR in 5 weeks.  Call Coumadin  Clinic [phone]

## 2023-09-14 ENCOUNTER — Ambulatory Visit (INDEPENDENT_AMBULATORY_CARE_PROVIDER_SITE_OTHER): Admitting: Podiatry

## 2023-09-14 ENCOUNTER — Encounter: Payer: Self-pay | Admitting: Podiatry

## 2023-09-14 DIAGNOSIS — B351 Tinea unguium: Secondary | ICD-10-CM

## 2023-09-14 DIAGNOSIS — M79674 Pain in right toe(s): Secondary | ICD-10-CM | POA: Diagnosis not present

## 2023-09-14 DIAGNOSIS — M79675 Pain in left toe(s): Secondary | ICD-10-CM | POA: Diagnosis not present

## 2023-09-14 DIAGNOSIS — E1169 Type 2 diabetes mellitus with other specified complication: Secondary | ICD-10-CM

## 2023-09-14 DIAGNOSIS — D689 Coagulation defect, unspecified: Secondary | ICD-10-CM

## 2023-09-14 NOTE — Progress Notes (Signed)
This patient returns to my office for at risk foot care.  This patient requires this care by a professional since this patient will be at risk due to having coagulation defect.  Patient is taking coumadin.  This patient is unable to cut nails herself since the patient cannot reach her nails.These nails are painful walking and wearing shoes.  This patient presents for at risk foot care today.  General Appearance  Alert, conversant and in no acute stress.  Vascular  Dorsalis pedis and posterior tibial  pulses are palpable  bilaterally.  Capillary return is within normal limits  bilaterally. Temperature is within normal limits  bilaterally.  Neurologic  Senn-Weinstein monofilament wire test within normal limits  bilaterally. Muscle power within normal limits bilaterally.  Nails Thick disfigured discolored nails with subungual debris  Second left and first and third right foot.  No evidence of bacterial infection or drainage bilaterally.  Orthopedic  No limitations of motion  feet .  No crepitus or effusions noted.  No bony pathology or digital deformities noted.  Skin  normotropic skin with no porokeratosis noted bilaterally.  No signs of infections or ulcers noted.     Onychomycosis  Pain in right toes  Pain in left toes  Consent was obtained for treatment procedures.   Mechanical debridement of nails 1-5  bilaterally performed with a nail nipper.  Filed with dremel without incident.    Return office visit   9 weeks                  Told patient to return for periodic foot care and evaluation due to potential at risk complications.   Limmie Schoenberg DPM  

## 2023-09-27 ENCOUNTER — Ambulatory Visit

## 2023-10-05 ENCOUNTER — Ambulatory Visit: Attending: Cardiology

## 2023-10-05 DIAGNOSIS — Z5181 Encounter for therapeutic drug level monitoring: Secondary | ICD-10-CM | POA: Insufficient documentation

## 2023-10-05 DIAGNOSIS — Z01810 Encounter for preprocedural cardiovascular examination: Secondary | ICD-10-CM | POA: Diagnosis present

## 2023-10-05 DIAGNOSIS — I4891 Unspecified atrial fibrillation: Secondary | ICD-10-CM | POA: Insufficient documentation

## 2023-10-05 DIAGNOSIS — I480 Paroxysmal atrial fibrillation: Secondary | ICD-10-CM | POA: Insufficient documentation

## 2023-10-05 LAB — POCT INR: INR: 2.9 (ref 2.0–3.0)

## 2023-10-05 NOTE — Progress Notes (Signed)
Please see anticoagulation encounter.

## 2023-10-05 NOTE — Patient Instructions (Signed)
 Continue taking warfarin 2 tablets daily except for 3 tablets on Tuesdays, Thursdays, and Saturdays. Recheck INR in 6 weeks.  Call Coumadin Clinic [phone]

## 2023-10-12 ENCOUNTER — Other Ambulatory Visit: Payer: Self-pay | Admitting: Cardiology

## 2023-11-16 ENCOUNTER — Ambulatory Visit (INDEPENDENT_AMBULATORY_CARE_PROVIDER_SITE_OTHER): Admitting: Podiatry

## 2023-11-16 ENCOUNTER — Encounter: Payer: Self-pay | Admitting: Podiatry

## 2023-11-16 ENCOUNTER — Ambulatory Visit: Attending: Cardiology | Admitting: *Deleted

## 2023-11-16 DIAGNOSIS — M79675 Pain in left toe(s): Secondary | ICD-10-CM

## 2023-11-16 DIAGNOSIS — B351 Tinea unguium: Secondary | ICD-10-CM | POA: Diagnosis not present

## 2023-11-16 DIAGNOSIS — E1169 Type 2 diabetes mellitus with other specified complication: Secondary | ICD-10-CM

## 2023-11-16 DIAGNOSIS — M79674 Pain in right toe(s): Secondary | ICD-10-CM | POA: Diagnosis not present

## 2023-11-16 DIAGNOSIS — Z5181 Encounter for therapeutic drug level monitoring: Secondary | ICD-10-CM | POA: Insufficient documentation

## 2023-11-16 DIAGNOSIS — D689 Coagulation defect, unspecified: Secondary | ICD-10-CM | POA: Diagnosis not present

## 2023-11-16 DIAGNOSIS — I4891 Unspecified atrial fibrillation: Secondary | ICD-10-CM | POA: Insufficient documentation

## 2023-11-16 DIAGNOSIS — I480 Paroxysmal atrial fibrillation: Secondary | ICD-10-CM | POA: Insufficient documentation

## 2023-11-16 DIAGNOSIS — Z01810 Encounter for preprocedural cardiovascular examination: Secondary | ICD-10-CM | POA: Insufficient documentation

## 2023-11-16 LAB — POCT INR: INR: 3.6 — AB (ref 2.0–3.0)

## 2023-11-16 NOTE — Patient Instructions (Signed)
 Description   INR-3.6; Do not take any warfarin today then continue taking warfarin 2 tablets daily except for 3 tablets on Tuesdays, Thursdays, and Saturdays. Recheck INR in 5 weeks.  Call Coumadin  Clinic [phone]

## 2023-11-16 NOTE — Progress Notes (Signed)
 Description   INR-3.6; Do not take any warfarin today then continue taking warfarin 2 tablets daily except for 3 tablets on Tuesdays, Thursdays, and Saturdays. Recheck INR in 5 weeks.  Call Coumadin  Clinic [phone]

## 2023-11-16 NOTE — Progress Notes (Signed)
This patient returns to my office for at risk foot care.  This patient requires this care by a professional since this patient will be at risk due to having coagulation defect.  Patient is taking coumadin.  This patient is unable to cut nails herself since the patient cannot reach her nails.These nails are painful walking and wearing shoes.  This patient presents for at risk foot care today.  General Appearance  Alert, conversant and in no acute stress.  Vascular  Dorsalis pedis and posterior tibial  pulses are palpable  bilaterally.  Capillary return is within normal limits  bilaterally. Temperature is within normal limits  bilaterally.  Neurologic  Senn-Weinstein monofilament wire test within normal limits  bilaterally. Muscle power within normal limits bilaterally.  Nails Thick disfigured discolored nails with subungual debris  Second left and first and third right foot.  No evidence of bacterial infection or drainage bilaterally.  Orthopedic  No limitations of motion  feet .  No crepitus or effusions noted.  No bony pathology or digital deformities noted.  Skin  normotropic skin with no porokeratosis noted bilaterally.  No signs of infections or ulcers noted.     Onychomycosis  Pain in right toes  Pain in left toes  Consent was obtained for treatment procedures.   Mechanical debridement of nails 1-5  bilaterally performed with a nail nipper.  Filed with dremel without incident.    Return office visit   9 weeks                  Told patient to return for periodic foot care and evaluation due to potential at risk complications.   Limmie Schoenberg DPM  

## 2023-12-03 ENCOUNTER — Other Ambulatory Visit: Payer: Self-pay | Admitting: Obstetrics and Gynecology

## 2023-12-03 DIAGNOSIS — N6320 Unspecified lump in the left breast, unspecified quadrant: Secondary | ICD-10-CM

## 2023-12-16 ENCOUNTER — Other Ambulatory Visit

## 2023-12-16 ENCOUNTER — Encounter

## 2023-12-21 ENCOUNTER — Ambulatory Visit: Attending: Cardiology | Admitting: Pharmacist

## 2023-12-21 ENCOUNTER — Encounter (HOSPITAL_BASED_OUTPATIENT_CLINIC_OR_DEPARTMENT_OTHER): Payer: Self-pay

## 2023-12-21 DIAGNOSIS — Z5181 Encounter for therapeutic drug level monitoring: Secondary | ICD-10-CM | POA: Diagnosis present

## 2023-12-21 DIAGNOSIS — I4891 Unspecified atrial fibrillation: Secondary | ICD-10-CM | POA: Insufficient documentation

## 2023-12-21 DIAGNOSIS — I480 Paroxysmal atrial fibrillation: Secondary | ICD-10-CM | POA: Insufficient documentation

## 2023-12-21 LAB — POCT INR: INR: 3 (ref 2.0–3.0)

## 2023-12-21 NOTE — Patient Instructions (Signed)
 Description   INR-3.0; Continue taking warfarin 2 tablets daily except for 3 tablets on Tuesdays, Thursdays, and Saturdays. Recheck INR in 5 weeks.  Call Coumadin  Clinic [phone]

## 2023-12-21 NOTE — Progress Notes (Signed)
 Description   INR-3.0; Continue taking warfarin 2 tablets daily except for 3 tablets on Tuesdays, Thursdays, and Saturdays. Recheck INR in 5 weeks.  Call Coumadin  Clinic [phone]

## 2023-12-23 ENCOUNTER — Telehealth (HOSPITAL_BASED_OUTPATIENT_CLINIC_OR_DEPARTMENT_OTHER): Payer: Self-pay | Admitting: *Deleted

## 2023-12-23 DIAGNOSIS — R202 Paresthesia of skin: Secondary | ICD-10-CM | POA: Insufficient documentation

## 2023-12-23 DIAGNOSIS — E1165 Type 2 diabetes mellitus with hyperglycemia: Secondary | ICD-10-CM | POA: Insufficient documentation

## 2023-12-23 DIAGNOSIS — R600 Localized edema: Secondary | ICD-10-CM | POA: Insufficient documentation

## 2023-12-23 DIAGNOSIS — M47817 Spondylosis without myelopathy or radiculopathy, lumbosacral region: Secondary | ICD-10-CM | POA: Insufficient documentation

## 2023-12-23 DIAGNOSIS — F439 Reaction to severe stress, unspecified: Secondary | ICD-10-CM | POA: Insufficient documentation

## 2023-12-23 DIAGNOSIS — M47816 Spondylosis without myelopathy or radiculopathy, lumbar region: Secondary | ICD-10-CM | POA: Insufficient documentation

## 2023-12-23 NOTE — Telephone Encounter (Signed)
 Called and spoke to pt and scheduled her to come in on 12/31/2023 for INR check so that she can be bridged.

## 2023-12-23 NOTE — Telephone Encounter (Signed)
 Pt called back though I was unavailable. I called pt back and left vm to call back to preop team.

## 2023-12-23 NOTE — Telephone Encounter (Signed)
 Patient is returning call.

## 2023-12-23 NOTE — Telephone Encounter (Signed)
 We can set up the televisit after the Lovenox  appointment that would be fine but if not it should not have to wait because instructions will be provided either way for the patient.

## 2023-12-23 NOTE — Telephone Encounter (Signed)
 Will forward back to preop APP fir further advice, will the tele preop appt need to be after anticoag appt to set up Lovenox .

## 2023-12-23 NOTE — Telephone Encounter (Signed)
 Patient with diagnosis of A Fib +DVT on warfarin for anticoagulation.    Procedure: RIGHT TRIGGER FINGER RELEASE  Date of procedure: 01/06/24   CHA2DS2-VASc Score = 5  This indicates a 7.2% annual risk of stroke. The patient's score is based upon: CHF History: 0 HTN History: 1 Diabetes History: 1 Stroke History: 0 Vascular Disease History: 1 Age Score: 1 Gender Score: 1    CrCl 120 ml/min Platelet count 334K  Patient has not had an Afib/aflutter ablation or Watchman within the last 3 months or DCCV within the last 30 days    Per office protocol, patient can hold warfarin for 5 days prior to procedure.    Patient WILL need bridging with Lovenox  (enoxaparin ) around procedure.  **This guidance is not considered finalized until pre-operative APP has relayed final recommendations.**

## 2023-12-23 NOTE — Telephone Encounter (Signed)
 I was able to s/w [facility] surgery scheduler and confirmed needed information.    PROCEDURE: RIGHT TRIGGER FINGER RELEASE   FAX# [phone] ATTN: JUDY  ANESTHESIA: LOCAL WITH MAC

## 2023-12-23 NOTE — Telephone Encounter (Signed)
Left message for the pt to call back to schedule a tele pre op appt 

## 2023-12-23 NOTE — Telephone Encounter (Signed)
 Left message for Dagoberto, surgery scheduler for Dr. Dalldorf [facility].   I will start the request and will need complete clearance information before sending to preop APP for review.      Pre-operative Risk Assessment    Patient Name: [name]  DOB: 1955-11-13 MRN: [id number]   Date of last office visit: 03/31/23 CHARLIES ARTHUR, PAC Date of next office visit: NONE   Request for Surgical Clearance    Procedure:  TRIGGER FINGER SURGERY  Date of Surgery:  Clearance 01/06/24                                Surgeon:  DR. MAUDE HERALD Surgeon's Group or Practice Name:  [facility] Phone number:  [phone] Fax number:    Type of Clearance Requested:   - Medical  - Pharmacy:  Hold Warfarin (Coumadin )     Type of Anesthesia:  LEFT MESSAGE FOR SURGERY SCHEDULER TO CALL BACK [facility] NEEDED INFORMATION   Additional requests/questions:    Bonney Niels Jest   12/23/2023, 10:24 AM

## 2023-12-23 NOTE — Telephone Encounter (Signed)
   Name: [name]  DOB: 1956/01/30  MRN: [id number]  Primary Cardiologist: Shelda Bruckner, MD   Preoperative team, please contact this patient and set up a phone call appointment for further preoperative risk assessment. Please obtain consent and complete medication review. Thank you for your help.  I confirm that guidance regarding antiplatelet and oral anticoagulation therapy has been completed and, if necessary, noted below.  Per office protocol, patient can hold warfarin for 5 days prior to procedure.     Patient WILL need bridging with Lovenox  (enoxaparin ) around procedure.  I also confirmed the patient resides in the state of [place] . As per [facility] Medical Board telemedicine laws, the patient must reside in the state in which the provider is licensed.   Wyn Raddle, Jackee Shove, NP 12/23/2023, 12:02 PM [facility]

## 2023-12-23 NOTE — Telephone Encounter (Signed)
-----   Message from Wyn Raddle, Jackee Shove sent at 12/23/2023  8:52 AM EDT ----- Regarding: FW: trigger finger sx  ----- Message ----- From: Darrell Bruckner, [facility] Sent: 12/21/2023   1:41 PM EDT To: Cv Div Preop Subject: trigger finger sx                              Patient says scheduled for trigger finger procurdure on 10/16.  Is on warfarin, do not see a clearance. [facility], Dr Dallorf

## 2023-12-24 NOTE — Telephone Encounter (Signed)
 Left message for the pt to call back as she is needing to be scheduled a tele preop appt.

## 2023-12-27 ENCOUNTER — Telehealth: Payer: Self-pay

## 2023-12-27 NOTE — Telephone Encounter (Signed)
  Patient Consent for Virtual Visit        [name] [facility] has provided verbal consent on 12/27/2023 for a virtual visit (video or telephone).   CONSENT FOR VIRTUAL VISIT FOR:  [name]  By participating in this virtual visit I agree to the following:  I hereby voluntarily request, consent and authorize [facility] and its employed or contracted physicians, physician assistants, nurse practitioners or other licensed health care professionals (the Practitioner), to provide me with telemedicine health care services (the "Services) as deemed necessary by the treating Practitioner. I acknowledge and consent to receive the Services by the Practitioner via telemedicine. I understand that the telemedicine visit will involve communicating with the Practitioner through live audiovisual communication technology and the disclosure of certain medical information by electronic transmission. I acknowledge that I have been given the opportunity to request an in-person assessment or other available alternative prior to the telemedicine visit and am voluntarily participating in the telemedicine visit.  I understand that I have the right to withhold or withdraw my consent to the use of telemedicine in the course of my care at any time, without affecting my right to future care or treatment, and that the Practitioner or I may terminate the telemedicine visit at any time. I understand that I have the right to inspect all information obtained and/or recorded in the course of the telemedicine visit and may receive copies of available information for a reasonable fee.  I understand that some of the potential risks of receiving the Services via telemedicine include:  Delay or interruption in medical evaluation due to technological equipment failure or disruption; Information transmitted may not be sufficient (e.g. poor resolution of images) to allow for appropriate medical decision making by the  Practitioner; and/or  In rare instances, security protocols could fail, causing a breach of personal health information.  Furthermore, I acknowledge that it is my responsibility to provide information about my medical history, conditions and care that is complete and accurate to the best of my ability. I acknowledge that Practitioner's advice, recommendations, and/or decision may be based on factors not within their control, such as incomplete or inaccurate data provided by me or distortions of diagnostic images or specimens that may result from electronic transmissions. I understand that the practice of medicine is not an exact science and that Practitioner makes no warranties or guarantees regarding treatment outcomes. I acknowledge that a copy of this consent can be made available to me via my patient portal [facility] MyChart), or I can request a printed copy by calling the office of [facility].    I understand that my insurance will be billed for this visit.   I have read or had this consent read to me. I understand the contents of this consent, which adequately explains the benefits and risks of the Services being provided via telemedicine.  I have been provided ample opportunity to ask questions regarding this consent and the Services and have had my questions answered to my satisfaction. I give my informed consent for the services to be provided through the use of telemedicine in my medical care

## 2023-12-27 NOTE — Telephone Encounter (Signed)
 Pt scheduled for VV on 12/30/23.

## 2023-12-28 ENCOUNTER — Ambulatory Visit
Admission: RE | Admit: 2023-12-28 | Discharge: 2023-12-28 | Disposition: A | Discharge status: Home / Self Care | Source: Ambulatory Visit | Attending: Obstetrics and Gynecology | Admitting: Obstetrics and Gynecology

## 2023-12-28 ENCOUNTER — Other Ambulatory Visit: Payer: Self-pay | Admitting: Obstetrics and Gynecology

## 2023-12-28 ENCOUNTER — Ambulatory Visit
Admission: RE | Admit: 2023-12-28 | Discharge: 2023-12-28 | Disposition: A | Source: Ambulatory Visit | Attending: Obstetrics and Gynecology | Admitting: Obstetrics and Gynecology

## 2023-12-28 DIAGNOSIS — N644 Mastodynia: Secondary | ICD-10-CM

## 2023-12-30 ENCOUNTER — Ambulatory Visit: Attending: Cardiovascular Disease

## 2023-12-30 DIAGNOSIS — Z01810 Encounter for preprocedural cardiovascular examination: Secondary | ICD-10-CM | POA: Diagnosis not present

## 2023-12-30 NOTE — Progress Notes (Signed)
 Virtual Visit via Telephone Note   Because of [name] co-morbid illnesses, she is at least at moderate risk for complications without adequate follow up.  This format is felt to be most appropriate for this patient at this time.  Due to technical limitations with video connection (technology), today's appointment will be conducted as an audio only telehealth visit, and [name] verbally agreed to proceed in this manner.   All issues noted in this document were discussed and addressed.  No physical exam could be performed with this format.  Evaluation Performed:  Preoperative cardiovascular risk assessment _____________   Date:  12/30/2023   Patient ID:  [name], DOB 08-Nov-1955, MRN [id number] Patient Location:  Home Provider location:   Office  Primary Care Provider:  Seabron Lenis, MD Primary Cardiologist:  Shelda Bruckner, MD  Chief Complaint / Patient Profile   67 y.o. y/o female with a h/o paroxysmal atrial fibrillation, hypertension, GERD, thrombophilia who is pending TRIGGER FINGER SURGERY and presents today for telephonic preoperative cardiovascular risk assessment.  History of Present Illness    [name] is a 68 y.o. female who presents via audio/video conferencing for a telehealth visit today.  Pt was last seen in cardiology clinic on 03/31/2023 by Charlies Arthur PA-C.  At that time [name] was doing well .  The patient is now pending procedure as outlined above. Since her last visit, she continues to be stable from a cardiac standpoint.  Today she denies chest pain, shortness of breath, lower extremity edema, fatigue, palpitations, melena, hematuria, hemoptysis, diaphoresis, weakness, presyncope, syncope, orthopnea, and PND.   Past Medical History    Past Medical History:  Diagnosis Date   Allergic rhinitis    Anginal pain    Arthritis    back (05/07/2016)   Asthma    Atrial flutter (HCC)    a. Remotely per notes.    Carpal tunnel syndrome, bilateral    Colon polyps    Diverticulitis    Dizziness    DVT (deep venous thrombosis) (HCC) 1980s x2 -    between birth of two sons. Saw hematology - was told she has a hypercoagulable disorder, should be on Coumadin  lifelong.   Dysrhythmia    atrial fib   Family history of adverse reaction to anesthesia    mother gets PONV   Gallbladder disease    GERD (gastroesophageal reflux disease)    GIB (gastrointestinal bleeding) 04/14/2018   Halothane adverse reaction    narrow small opening   Heart murmur    Hiatal hernia    History of hiatal hernia    small one/CTA 05/01/2016 (05/07/2016)   History of kidney stones    HTN (hypertension)    Hypercholesteremia    hx; brought it down w/diet (05/07/2016)   Hyperglycemia    a. A1c 6.1 in 2014.   Inguinal hernia    Iron  deficiency anemia due to chronic blood loss 04/14/2018   Iron  malabsorption 04/14/2018   Left sided sciatica    Normal cardiac stress test 06/2012   Obesity    OSA (obstructive sleep apnea)    a. Mild, did not tolerate CPAP (05/07/2016)   PAF (paroxysmal atrial fibrillation) (HCC)    a. s/p afib ablation at [name] 2010 (Dr. Arlon). b. Recurrent AF s/p DCCV 06/2010. c. On flecainide .    Paroxysmal atrial fibrillation (HCC) 09/09/2009   Qualifier: Diagnosis of  By: Lawernce CMA, Jewel     PONV (postoperative nausea and  vomiting)    PPD positive, treated 1977   treated for 1 yr after exposure to patient   Pre-diabetes    RSV infection ~ 2006   Vertigo    Wandering (atrial) pacemaker    a. Remotely per notes.  (  pt. states has a wandering p wave ) no pacemaker   Past Surgical History:  Procedure Laterality Date   ANKLE ARTHODESIS W/ ARTHROSCOPY Right 11/12/2020   ATRIAL ABLATION SURGERY  05/07/2016   fib and flutter   ATRIAL FIBRILLATION ABLATION  2010   at [facility]   ATRIAL FIBRILLATION ABLATION N/A 05/07/2016   Procedure: Atrial Fibrillation Ablation;  Surgeon: Lynwood Rakers, MD;  Location: [name] INVASIVE CV LAB;  Service: Cardiovascular;  Laterality: N/A;   ATRIAL FIBRILLATION ABLATION N/A 12/18/2022   Procedure: ATRIAL FIBRILLATION ABLATION;  Surgeon: Cindie Ole DASEN, MD;  Location: [facility] INVASIVE CV LAB;  Service: Cardiovascular;  Laterality: N/A;   BREAST CYST ASPIRATION Bilateral    CARDIAC CATHETERIZATION  2008   CARPAL TUNNEL RELEASE Bilateral    CATARACT EXTRACTION Bilateral 2022   CESAREAN SECTION  1986; 1988   COLON RESECTION     COLONOSCOPY  X 2   COLONOSCOPY W/ BIOPSIES AND POLYPECTOMY  X 2   CYSTOSCOPY/URETEROSCOPY/HOLMIUM LASER/STENT PLACEMENT Left 12/23/2019   Procedure: CYSTOSCOPY/URETEROSCOPY/HOLMIUM LASER/STENT PLACEMENT;  Surgeon: Elisabeth Valli BIRCH, MD;  Location: [facility] ORS;  Service: Urology;  Laterality: Left;   ESOPHAGOGASTRODUODENOSCOPY     ESOPHAGOGASTRODUODENOSCOPY (EGD) WITH PROPOFOL  N/A 07/19/2017   Procedure: ESOPHAGOGASTRODUODENOSCOPY (EGD) WITH PROPOFOL ;  Surgeon: Leigh Elspeth SQUIBB, MD;  Location: [facility] ENDOS[facility];  Service: Gastroenterology;  Laterality: N/A;   INGUINAL HERNIA REPAIR Right    KNEE ARTHROSCOPY Right 06/10/2021   Procedure: RIGHT KNEE ARTHROSCOPY;  Surgeon: Sheril Coy, MD;  Location: [facility] OR[facility];  Service: Orthopedics;  Laterality: Right;   LAPAROSCOPIC CHOLECYSTECTOMY  1989   POLYPECTOMY N/A 07/19/2017   Procedure: POLYPECTOMY;  Surgeon: Leigh Elspeth SQUIBB, MD;  Location: [facility];  Service: Gastroenterology;  Laterality: N/A;   PROCTOSCOPY N/A 08/06/2017   Procedure: PROCTOSCOPY;  Surgeon: Sheldon Elspeth, MD;  Location: [facility];  Service: General;  Laterality: N/A;   TMJ ARTHROPLASTY     TOTAL KNEE ARTHROPLASTY Right 01/06/2022   Procedure: RIGHT TOTAL KNEE ARTHROPLASTY;  Surgeon: Sheril Coy, MD;  Location: [facility];  Service: Orthopedics;  Laterality: Right;   TUBAL LIGATION  1988    Allergies  Allergies  Allergen Reactions   Aspirin Other (See Comments)    Makes asthma worse and makes stomach hurt    Banana Swelling and Cough    Inside of the mouth swells   Food Other (See Comments) and Cough    Nuts- mucus membranes inflamed/cough    Iodinated Contrast Media Anaphylaxis    Required epinephrine . Since then, has tolerated with premed. Other reaction(s): wheeze...requires premdication with prednisone  3 days in advance   Latex Hives and Cough    Wheezing/cough  Other reaction(s): hives/wheeze   Meperidine  Hcl Other (See Comments)    Projectile vomiting    Peanut-Containing Drug Products Swelling and Cough    Inside of the mouth swells   Penicillins Anaphylaxis    Has patient had a PCN reaction causing immediate rash, facial/tongue/throat swelling, SOB or lightheadedness with hypotension:Yes Has patient had a PCN reaction causing severe rash involving mucus membranes or skin necrosis:Yes Has patient had a PCN reaction that required hospitalization:Treated in ER w/epi & breathing treatments Has patient had a PCN reaction occurring within the last 10  years:No If all of the above answers are NO, then may proceed with Cephalosporin use.    Statins Other (See Comments)    Severe leg pain - has tried most of them   Sulfonamide Derivatives Anaphylaxis   Tape Hives and Dermatitis    No plastic tape!!   Alfuzosin Other (See Comments)    Headache, dizziness   Halothane     Listed under anesthesia adverse reactions pt does not know what reaction or if any at preop on 05/26/21.    Other Other (See Comments)    Vascepa- upset stomach   Sulfamethoxazole Hives    Home Medications    Prior to Admission medications   Medication Sig Start Date End Date Taking? Authorizing Provider  acetaminophen  (TYLENOL ) 650 MG CR tablet Take 1,300 mg by mouth every 8 (eight) hours as needed for pain.     [provider]  APPLE CIDER VINEGAR PO Take 450 mg by mouth in the morning.    [provider]  cetirizine (ZYRTEC) 10 MG tablet Take 10 mg by mouth in the morning.    [provider]  Cholecalciferol  (VITAMIN D3) 50 MCG (2000 UT) capsule Take 2,000 Units by mouth in the morning.    [provider]  colchicine  0.6 MG tablet Take 1 tablet (0.6 mg total) by mouth 2 (two) times daily for 5 days. 12/18/22 12/27/23  Lesia Ozell Barter, PA-C  Continuous Blood Gluc Sensor (FREESTYLE LIBRE 2 SENSOR) MISC APPLY ONE SENSOR TO THE BACK OF THE UPPER ARM EVERY 14 DAYS    [provider]  dicyclomine  (BENTYL ) 10 MG capsule TAKE 1 CAPSULE BY MOUTH 30 MINS BEFORE MEALS AND AT BEDTIME AS NEEDED FOR PAIN/CRAMPING 12/23/18   Zehr, Jessica D, PA-C  ezetimibe  (ZETIA ) 10 MG tablet TAKE 1 TABLET BY MOUTH EVERY DAY 07/26/23   Hilty, Vinie BROCKS, MD  FLOVENT  HFA 220 MCG/ACT inhaler Inhale 1 puff into the lungs 2 (two) times daily. 04/20/16   [provider]  fluticasone  (FLONASE ) 50 MCG/ACT nasal spray Place 1 spray into both nostrils 2 (two) times daily.  06/02/12   [provider]  losartan  (COZAAR ) 25 MG tablet Take 1 tablet (25 mg total) by mouth daily. 12/20/13   Allred, Lynwood, MD  meclizine  (ANTIVERT ) 25 MG tablet Take 1 tablet (25 mg total) by mouth 3 (three) times daily as needed for dizziness. 01/19/18   Randol Simmonds, MD  Multiple Vitamin (MULTIVITAMIN WITH MINERALS) TABS tablet Take 1 tablet by mouth in the morning.    [provider]  nitroGLYCERIN  (NITROSTAT ) 0.4 MG SL tablet PLACE 1 TABLET UNDER THE TONGUE EVERY 5 MINUTES AS NEEDED. 05/22/21   Leverne Charlies Helling, PA-C  omeprazole  (PRILOSEC) 40 MG capsule TAKE 1 CAPSULE BY MOUTH DAILY, CAN INCREASE TO 2 TIMES DAILY IF NEEDED 01/14/23   Kerman Vina HERO, NP  OZEMPIC, 1 MG/DOSE, 4 MG/3ML SOPN Inject 1 mg into the skin once a week. Tuesdays 09/08/22   [provider]  polyethylene glycol (MIRALAX ) 17 g packet Take 17 g by mouth 2 (two) times daily. Titrate as needed Patient taking differently: Take 17 g by mouth 2 (two) times daily as needed (constipation.). 01/23/21   Armbruster, Elspeth SQUIBB,  MD  PROAIR  HFA 108 (90 BASE) MCG/ACT inhaler Inhale 2 puffs into the lungs every 6 (six) hours as needed for wheezing or shortness of breath.  03/22/12   [provider]  venlafaxine  XR (EFFEXOR -XR) 37.5 MG 24 hr capsule Take 37.5  mg by mouth daily with breakfast.  11/21/12   [provider]  verapamil  (CALAN ) 80 MG tablet Take 160 mg by mouth 2 (two) times daily.    [provider]  warfarin (COUMADIN ) 2 MG tablet TAKE 4MG  DAILY EXCEPT 6MG  ON TUESDAYS, THURSDAYS,AND SATURDAYS AS DIRECTED BY ANTICOAGULATION CLINIC 07/26/23   Cindie Ole DASEN, MD    Physical Exam    Vital Signs:  [name] does not have vital signs available for review today.  Given telephonic nature of communication, physical exam is limited. AAOx3. NAD. Normal affect.  Speech and respirations are unlabored.  Accessory Clinical Findings    None  Assessment & Plan    1.  Preoperative Cardiovascular Risk Assessment:TRIGGER FINGER SURGERY   Date of Surgery:  Clearance 01/06/24                                  Surgeon:  DR. MAUDE HERALD Surgeon's Group or Practice Name:  [facility] Phone number:  [phone] Fax number:        Primary Cardiologist: Shelda Bruckner, MD  Chart reviewed as part of pre-operative protocol coverage. Given past medical history and time since last visit, based on ACC/AHA guidelines, [name] would be at acceptable risk for the planned procedure without further cardiovascular testing.   Her RCRI is low risk, 0.9% risk of major cardiac event.  She is able to complete greater than 4 METS of physical activity.  Patient was advised that if she develops new symptoms prior to surgery to contact our office to arrange a follow-up appointment.  She verbalized understanding.  Per office protocol, patient can hold warfarin for 5 days prior to procedure.     Patient WILL need bridging with Lovenox  (enoxaparin ) around procedure.  I will route  this recommendation to the requesting party via Epic fax function and remove from pre-op pool.       Time:   Today, I have spent 5 minutes with the patient with telehealth technology discussing medical history, symptoms, and management plan.  I spent 10 minutes reviewing patient's past cardiac history and cardiac medications.    Josefa CHRISTELLA Beauvais, NP  12/30/2023, 7:18 AM

## 2023-12-31 ENCOUNTER — Ambulatory Visit: Admitting: Pharmacist

## 2023-12-31 DIAGNOSIS — Z5181 Encounter for therapeutic drug level monitoring: Secondary | ICD-10-CM | POA: Insufficient documentation

## 2023-12-31 DIAGNOSIS — I4891 Unspecified atrial fibrillation: Secondary | ICD-10-CM | POA: Diagnosis present

## 2023-12-31 DIAGNOSIS — Z86718 Personal history of other venous thrombosis and embolism: Secondary | ICD-10-CM | POA: Insufficient documentation

## 2023-12-31 DIAGNOSIS — D6869 Other thrombophilia: Secondary | ICD-10-CM | POA: Insufficient documentation

## 2023-12-31 DIAGNOSIS — I480 Paroxysmal atrial fibrillation: Secondary | ICD-10-CM | POA: Insufficient documentation

## 2023-12-31 DIAGNOSIS — I4819 Other persistent atrial fibrillation: Secondary | ICD-10-CM | POA: Diagnosis present

## 2023-12-31 LAB — POCT INR: INR: 2.9 (ref 2.0–3.0)

## 2023-12-31 MED ORDER — ENOXAPARIN SODIUM 100 MG/ML IJ SOSY: 100.0000 mg | PREFILLED_SYRINGE | Freq: Two times a day (BID) | INTRAMUSCULAR | 0 refills | Status: DC

## 2023-12-31 NOTE — Progress Notes (Signed)
 Description   INR-2.9 ; Continue taking warfarin 2 tablets daily except for 3 tablets on Tuesdays, Thursdays, and Saturdays. Recheck INR in 5 weeks.  Call Coumadin  Clinic [phone]       10/10: Last dose of warfarin.  10/11: No warfarin or enoxaparin  (Lovenox ).  10/12: Inject enoxaparin  100mg  in the fatty abdominal tissue at least 2 inches from the belly button twice a day about 12 hours apart, 8am and 8pm rotate sites. No warfarin.  10/13: Inject enoxaparin  in the fatty tissue every 12 hours, 8am and 8pm. No warfarin.  10/14: Inject enoxaparin  in the fatty tissue every 12 hours, 8am and 8pm. No warfarin.  10/15: Inject enoxaparin  in the fatty tissue in the morning at 8 am (No PM dose). No warfarin.  10/16: Procedure Day - No enoxaparin  - Resume warfarin in the evening or as directed by doctor (take an extra half tablet with usual dose for 2 days then resume normal dose).  10/17: Resume enoxaparin  inject in the fatty tissue every 12 hours and take warfarin  10/18: Inject enoxaparin  in the fatty tissue every 12 hours and take warfarin  10/19: Inject enoxaparin  in the fatty tissue every 12 hours and take warfarin  10/20: Inject enoxaparin  in the fatty tissue every 12 hours and take warfarin  10/21: Inject enoxaparin  in the fatty tissue every 12 hours and take warfarin  10/22: warfarin appt to check INR.

## 2023-12-31 NOTE — Patient Instructions (Addendum)
 Description   INR-2.9 ; Last dose of warfarin tonight and then hold until the 16th.  Then continue normal dosage schedule. Recheck 10/22.   Call Coumadin  Clinic [phone]   10/10: Last dose of warfarin.  10/11: No warfarin or enoxaparin  (Lovenox ).  10/12: Inject enoxaparin  100mg  in the fatty abdominal tissue at least 2 inches from the belly button twice a day about 12 hours apart, 8am and 8pm rotate sites. No warfarin.  10/13: Inject enoxaparin  in the fatty tissue every 12 hours, 8am and 8pm. No warfarin.  10/14: Inject enoxaparin  in the fatty tissue every 12 hours, 8am and 8pm. No warfarin.  10/15: Inject enoxaparin  in the fatty tissue in the morning at 8 am (No PM dose). No warfarin.  10/16: Procedure Day - No enoxaparin  - Resume warfarin in the evening or as directed by doctor (take an extra half tablet with usual dose for 2 days then resume normal dose).  10/17: Resume enoxaparin  inject in the fatty tissue every 12 hours and take warfarin  10/18: Inject enoxaparin  in the fatty tissue every 12 hours and take warfarin  10/19: Inject enoxaparin  in the fatty tissue every 12 hours and take warfarin  10/20: Inject enoxaparin  in the fatty tissue every 12 hours and take warfarin  10/21: Inject enoxaparin  in the fatty tissue every 12 hours and take warfarin  10/22: warfarin appt to check INR.

## 2024-01-13 ENCOUNTER — Ambulatory Visit: Attending: Cardiovascular Disease

## 2024-01-13 DIAGNOSIS — I480 Paroxysmal atrial fibrillation: Secondary | ICD-10-CM | POA: Insufficient documentation

## 2024-01-13 DIAGNOSIS — Z86718 Personal history of other venous thrombosis and embolism: Secondary | ICD-10-CM | POA: Diagnosis present

## 2024-01-13 DIAGNOSIS — I4819 Other persistent atrial fibrillation: Secondary | ICD-10-CM | POA: Insufficient documentation

## 2024-01-13 DIAGNOSIS — D6869 Other thrombophilia: Secondary | ICD-10-CM | POA: Diagnosis present

## 2024-01-13 DIAGNOSIS — Z5181 Encounter for therapeutic drug level monitoring: Secondary | ICD-10-CM | POA: Insufficient documentation

## 2024-01-13 LAB — POCT INR: INR: 2.7 (ref 2.0–3.0)

## 2024-01-13 NOTE — Patient Instructions (Signed)
 Continue 2 tablets Daily, except 3 tablets every Tuesday, Thursday and Saturday.  INR in 4 weeks. Call Coumadin  Clinic [phone]

## 2024-01-13 NOTE — Progress Notes (Signed)
 INR 2.7 Please see anticoagulation encounter Continue 2 tablets Daily, except 3 tablets every Tuesday, Thursday and Saturday.  INR in 4 weeks. Call Coumadin  Clinic [phone]

## 2024-01-18 ENCOUNTER — Encounter: Payer: Self-pay | Admitting: Podiatry

## 2024-01-18 ENCOUNTER — Ambulatory Visit (INDEPENDENT_AMBULATORY_CARE_PROVIDER_SITE_OTHER): Admitting: Podiatry

## 2024-01-18 DIAGNOSIS — B351 Tinea unguium: Secondary | ICD-10-CM

## 2024-01-18 DIAGNOSIS — D689 Coagulation defect, unspecified: Secondary | ICD-10-CM | POA: Diagnosis not present

## 2024-01-18 DIAGNOSIS — M79675 Pain in left toe(s): Secondary | ICD-10-CM

## 2024-01-18 DIAGNOSIS — E1169 Type 2 diabetes mellitus with other specified complication: Secondary | ICD-10-CM

## 2024-01-18 DIAGNOSIS — M79674 Pain in right toe(s): Secondary | ICD-10-CM

## 2024-01-18 NOTE — Progress Notes (Signed)
This patient returns to my office for at risk foot care.  This patient requires this care by a professional since this patient will be at risk due to having coagulation defect.  Patient is taking coumadin.  This patient is unable to cut nails herself since the patient cannot reach her nails.These nails are painful walking and wearing shoes.  This patient presents for at risk foot care today.  General Appearance  Alert, conversant and in no acute stress.  Vascular  Dorsalis pedis and posterior tibial  pulses are palpable  bilaterally.  Capillary return is within normal limits  bilaterally. Temperature is within normal limits  bilaterally.  Neurologic  Senn-Weinstein monofilament wire test within normal limits  bilaterally. Muscle power within normal limits bilaterally.  Nails Thick disfigured discolored nails with subungual debris  Second left and first and third right foot.  No evidence of bacterial infection or drainage bilaterally.  Orthopedic  No limitations of motion  feet .  No crepitus or effusions noted.  No bony pathology or digital deformities noted.  Skin  normotropic skin with no porokeratosis noted bilaterally.  No signs of infections or ulcers noted.     Onychomycosis  Pain in right toes  Pain in left toes  Consent was obtained for treatment procedures.   Mechanical debridement of nails 1-5  bilaterally performed with a nail nipper.  Filed with dremel without incident.    Return office visit   9 weeks                  Told patient to return for periodic foot care and evaluation due to potential at risk complications.   Limmie Schoenberg DPM  

## 2024-01-25 ENCOUNTER — Encounter

## 2024-02-10 ENCOUNTER — Other Ambulatory Visit: Payer: Self-pay

## 2024-02-10 ENCOUNTER — Ambulatory Visit: Attending: Cardiology

## 2024-02-10 DIAGNOSIS — Z86718 Personal history of other venous thrombosis and embolism: Secondary | ICD-10-CM | POA: Diagnosis present

## 2024-02-10 DIAGNOSIS — I480 Paroxysmal atrial fibrillation: Secondary | ICD-10-CM | POA: Insufficient documentation

## 2024-02-10 DIAGNOSIS — D6869 Other thrombophilia: Secondary | ICD-10-CM | POA: Insufficient documentation

## 2024-02-10 DIAGNOSIS — Z5181 Encounter for therapeutic drug level monitoring: Secondary | ICD-10-CM | POA: Insufficient documentation

## 2024-02-10 DIAGNOSIS — I4819 Other persistent atrial fibrillation: Secondary | ICD-10-CM | POA: Insufficient documentation

## 2024-02-10 LAB — POCT INR: INR: 2.3 (ref 2.0–3.0)

## 2024-02-10 MED ORDER — WARFARIN SODIUM 2 MG PO TABS: ORAL_TABLET | ORAL | 1 refills | Status: AC

## 2024-02-10 NOTE — Progress Notes (Signed)
 INR 2.3 Please see anticoagulation encounter Continue 2 tablets Daily, except 3 tablets every Tuesday, Thursday and Saturday.  INR in 6 weeks. Call Coumadin  Clinic [phone]

## 2024-02-10 NOTE — Patient Instructions (Signed)
 Continue 2 tablets Daily, except 3 tablets every Tuesday, Thursday and Saturday.  INR in 6 weeks. Call Coumadin  Clinic [phone]

## 2024-02-21 ENCOUNTER — Emergency Department (HOSPITAL_BASED_OUTPATIENT_CLINIC_OR_DEPARTMENT_OTHER)

## 2024-02-21 ENCOUNTER — Observation Stay (HOSPITAL_BASED_OUTPATIENT_CLINIC_OR_DEPARTMENT_OTHER)
Admission: EM | Admit: 2024-02-21 | Discharge: 2024-02-23 | Disposition: A | Attending: Internal Medicine | Admitting: Internal Medicine

## 2024-02-21 ENCOUNTER — Encounter (HOSPITAL_BASED_OUTPATIENT_CLINIC_OR_DEPARTMENT_OTHER): Payer: Self-pay

## 2024-02-21 ENCOUNTER — Other Ambulatory Visit: Payer: Self-pay

## 2024-02-21 DIAGNOSIS — Z7901 Long term (current) use of anticoagulants: Secondary | ICD-10-CM | POA: Insufficient documentation

## 2024-02-21 DIAGNOSIS — N12 Tubulo-interstitial nephritis, not specified as acute or chronic: Principal | ICD-10-CM | POA: Insufficient documentation

## 2024-02-21 DIAGNOSIS — E1165 Type 2 diabetes mellitus with hyperglycemia: Secondary | ICD-10-CM | POA: Diagnosis present

## 2024-02-21 DIAGNOSIS — N200 Calculus of kidney: Secondary | ICD-10-CM | POA: Insufficient documentation

## 2024-02-21 DIAGNOSIS — D500 Iron deficiency anemia secondary to blood loss (chronic): Secondary | ICD-10-CM | POA: Diagnosis present

## 2024-02-21 DIAGNOSIS — J309 Allergic rhinitis, unspecified: Secondary | ICD-10-CM | POA: Insufficient documentation

## 2024-02-21 DIAGNOSIS — H81399 Other peripheral vertigo, unspecified ear: Secondary | ICD-10-CM | POA: Insufficient documentation

## 2024-02-21 DIAGNOSIS — N390 Urinary tract infection, site not specified: Secondary | ICD-10-CM | POA: Diagnosis present

## 2024-02-21 DIAGNOSIS — K219 Gastro-esophageal reflux disease without esophagitis: Secondary | ICD-10-CM | POA: Diagnosis present

## 2024-02-21 DIAGNOSIS — Z91040 Latex allergy status: Secondary | ICD-10-CM | POA: Insufficient documentation

## 2024-02-21 DIAGNOSIS — I480 Paroxysmal atrial fibrillation: Secondary | ICD-10-CM | POA: Diagnosis present

## 2024-02-21 DIAGNOSIS — Z96651 Presence of right artificial knee joint: Secondary | ICD-10-CM | POA: Insufficient documentation

## 2024-02-21 DIAGNOSIS — I10 Essential (primary) hypertension: Secondary | ICD-10-CM | POA: Insufficient documentation

## 2024-02-21 DIAGNOSIS — Z79899 Other long term (current) drug therapy: Secondary | ICD-10-CM | POA: Insufficient documentation

## 2024-02-21 DIAGNOSIS — E782 Mixed hyperlipidemia: Secondary | ICD-10-CM | POA: Diagnosis present

## 2024-02-21 DIAGNOSIS — R42 Dizziness and giddiness: Secondary | ICD-10-CM

## 2024-02-21 DIAGNOSIS — Z86718 Personal history of other venous thrombosis and embolism: Secondary | ICD-10-CM | POA: Insufficient documentation

## 2024-02-21 DIAGNOSIS — Z91010 Allergy to peanuts: Secondary | ICD-10-CM | POA: Insufficient documentation

## 2024-02-21 DIAGNOSIS — F39 Unspecified mood [affective] disorder: Secondary | ICD-10-CM | POA: Diagnosis present

## 2024-02-21 DIAGNOSIS — J45909 Unspecified asthma, uncomplicated: Secondary | ICD-10-CM | POA: Insufficient documentation

## 2024-02-21 LAB — URINALYSIS, ROUTINE W REFLEX MICROSCOPIC
Bacteria, UA: NONE SEEN
Bilirubin Urine: NEGATIVE
Glucose, UA: NEGATIVE mg/dL
Ketones, ur: NEGATIVE mg/dL
Nitrite: NEGATIVE
Protein, ur: 100 mg/dL — AB
RBC / HPF: >50 RBC/hpf (ref 0–5)
Specific Gravity, Urine: 1.019 (ref 1.005–1.030)
WBC, UA: >50 WBC/hpf (ref 0–5)
pH: 6 (ref 5.0–8.0)

## 2024-02-21 LAB — CBC WITH DIFFERENTIAL/PLATELET
Abs Immature Granulocytes: 0.01 K/uL (ref 0.00–0.07)
Basophils Absolute: 0.1 K/uL (ref 0.0–0.1)
Basophils Relative: 1 %
Eosinophils Absolute: 0.1 K/uL (ref 0.0–0.5)
Eosinophils Relative: 2 %
HCT: 41.7 % (ref 36.0–46.0)
Hemoglobin: 14.1 g/dL (ref 12.0–15.0)
Immature Granulocytes: 0 %
Lymphocytes Relative: 44 %
Lymphs Abs: 3.3 K/uL (ref 0.7–4.0)
MCH: 30.3 pg (ref 26.0–34.0)
MCHC: 33.8 g/dL (ref 30.0–36.0)
MCV: 89.7 fL (ref 80.0–100.0)
Monocytes Absolute: 0.7 K/uL (ref 0.1–1.0)
Monocytes Relative: 9 %
Neutro Abs: 3.4 K/uL (ref 1.7–7.7)
Neutrophils Relative %: 44 %
Platelets: 354 K/uL (ref 150–400)
RBC: 4.65 MIL/uL (ref 3.87–5.11)
RDW: 12.4 % (ref 11.5–15.5)
WBC: 7.6 K/uL (ref 4.0–10.5)
nRBC: 0 % (ref 0.0–0.2)

## 2024-02-21 LAB — COMPREHENSIVE METABOLIC PANEL WITH GFR
ALT: 17 U/L (ref 0–44)
AST: 22 U/L (ref 15–41)
Albumin: 4.2 g/dL (ref 3.5–5.0)
Alkaline Phosphatase: 75 U/L (ref 38–126)
Anion gap: 10 (ref 5–15)
BUN: 11 mg/dL (ref 8–23)
CO2: 29 mmol/L (ref 22–32)
Calcium: 10.4 mg/dL — ABNORMAL HIGH (ref 8.9–10.3)
Chloride: 100 mmol/L (ref 98–111)
Creatinine, Ser: 0.61 mg/dL (ref 0.44–1.00)
GFR, Estimated: >60 mL/min (ref >60)
Glucose, Bld: 120 mg/dL — ABNORMAL HIGH (ref 70–99)
Potassium: 4 mmol/L (ref 3.5–5.1)
Sodium: 139 mmol/L (ref 135–145)
Total Bilirubin: 0.3 mg/dL (ref 0.0–1.2)
Total Protein: 7.3 g/dL (ref 6.5–8.1)

## 2024-02-21 LAB — PROTIME-INR
INR: 2 — ABNORMAL HIGH (ref 0.8–1.2)
Prothrombin Time: 24.1 s — ABNORMAL HIGH (ref 11.4–15.2)

## 2024-02-21 LAB — LACTIC ACID, PLASMA
Lactic Acid, Venous: 2 mmol/L (ref 0.5–1.9)
Lactic Acid, Venous: 0.7 mmol/L (ref 0.5–1.9)

## 2024-02-21 LAB — GLUCOSE, CAPILLARY: Glucose-Capillary: 126 mg/dL — ABNORMAL HIGH (ref 70–99)

## 2024-02-21 MED ORDER — LACTATED RINGERS IV BOLUS
1000.0000 mL | Freq: Once | INTRAVENOUS | Status: AC
  Administered 2024-02-21: 1000 mL via INTRAVENOUS

## 2024-02-21 MED ORDER — BISACODYL 5 MG PO TBEC: 5.0000 mg | DELAYED_RELEASE_TABLET | Freq: Every day | ORAL | Status: DC | PRN

## 2024-02-21 MED ORDER — ACETAMINOPHEN 325 MG PO TABS: 650.0000 mg | ORAL_TABLET | Freq: Four times a day (QID) | ORAL | Status: DC | PRN

## 2024-02-21 MED ORDER — INSULIN ASPART 100 UNIT/ML IJ SOLN: 0.0000 [IU] | Freq: Three times a day (TID) | INTRAMUSCULAR | Status: DC

## 2024-02-21 MED ORDER — KETOROLAC TROMETHAMINE 15 MG/ML IJ SOLN
15.0000 mg | Freq: Once | INTRAMUSCULAR | Status: AC
  Administered 2024-02-21: 15 mg via INTRAVENOUS
  Filled 2024-02-21: qty 1

## 2024-02-21 MED ORDER — MORPHINE SULFATE (PF) 4 MG/ML IV SOLN
4.0000 mg | Freq: Once | INTRAVENOUS | Status: AC
  Administered 2024-02-21: 4 mg via INTRAVENOUS
  Filled 2024-02-21: qty 1

## 2024-02-21 MED ORDER — ONDANSETRON HCL 4 MG PO TABS
4.0000 mg | ORAL_TABLET | Freq: Four times a day (QID) | ORAL | Status: DC | PRN
  Administered 2024-02-23: 4 mg via ORAL
  Filled 2024-02-21: qty 1

## 2024-02-21 MED ORDER — ACETAMINOPHEN 650 MG RE SUPP: 650.0000 mg | Freq: Four times a day (QID) | RECTAL | Status: DC | PRN

## 2024-02-21 MED ORDER — LEVOFLOXACIN IN D5W 500 MG/100ML IV SOLN
500.0000 mg | INTRAVENOUS | Status: DC
  Administered 2024-02-22: 500 mg via INTRAVENOUS
  Filled 2024-02-21: qty 100

## 2024-02-21 MED ORDER — DICYCLOMINE HCL 10 MG PO CAPS: 10.0000 mg | ORAL_CAPSULE | Freq: Four times a day (QID) | ORAL | Status: DC | PRN

## 2024-02-21 MED ORDER — ALBUTEROL SULFATE (2.5 MG/3ML) 0.083% IN NEBU: 2.5000 mg | INHALATION_SOLUTION | Freq: Four times a day (QID) | RESPIRATORY_TRACT | Status: DC | PRN

## 2024-02-21 MED ORDER — WARFARIN SODIUM 4 MG PO TABS
4.0000 mg | ORAL_TABLET | Freq: Once | ORAL | Status: AC
  Administered 2024-02-22: 4 mg via ORAL
  Filled 2024-02-21: qty 1

## 2024-02-21 MED ORDER — SENNOSIDES-DOCUSATE SODIUM 8.6-50 MG PO TABS: 1.0000 | ORAL_TABLET | Freq: Every evening | ORAL | Status: DC | PRN

## 2024-02-21 MED ORDER — ADULT MULTIVITAMIN W/MINERALS CH
1.0000 | ORAL_TABLET | Freq: Every morning | ORAL | Status: DC
  Administered 2024-02-22 – 2024-02-23 (×2): 1 via ORAL
  Filled 2024-02-21 (×2): qty 1

## 2024-02-21 MED ORDER — LORATADINE 10 MG PO TABS
10.0000 mg | ORAL_TABLET | Freq: Every day | ORAL | Status: DC
  Administered 2024-02-22 – 2024-02-23 (×2): 10 mg via ORAL
  Filled 2024-02-21 (×3): qty 1

## 2024-02-21 MED ORDER — LOSARTAN POTASSIUM 25 MG PO TABS
25.0000 mg | ORAL_TABLET | Freq: Every day | ORAL | Status: DC
  Administered 2024-02-22 – 2024-02-23 (×2): 25 mg via ORAL
  Filled 2024-02-21 (×3): qty 1

## 2024-02-21 MED ORDER — VENLAFAXINE HCL ER 37.5 MG PO CP24
37.5000 mg | ORAL_CAPSULE | Freq: Every day | ORAL | Status: DC
  Administered 2024-02-22 – 2024-02-23 (×2): 37.5 mg via ORAL
  Filled 2024-02-21 (×3): qty 1

## 2024-02-21 MED ORDER — VITAMIN D 25 MCG (1000 UNIT) PO TABS
2000.0000 [IU] | ORAL_TABLET | Freq: Every morning | ORAL | Status: DC
  Administered 2024-02-22 – 2024-02-23 (×2): 2000 [IU] via ORAL
  Filled 2024-02-21 (×2): qty 2

## 2024-02-21 MED ORDER — FLUTICASONE PROPIONATE 50 MCG/ACT NA SUSP
1.0000 | Freq: Two times a day (BID) | NASAL | Status: DC
  Administered 2024-02-22 – 2024-02-23 (×4): 1 via NASAL
  Filled 2024-02-21 (×3): qty 16

## 2024-02-21 MED ORDER — ISOSORBIDE MONONITRATE ER 30 MG PO TB24
30.0000 mg | ORAL_TABLET | Freq: Every day | ORAL | Status: DC
  Administered 2024-02-21 – 2024-02-23 (×3): 30 mg via ORAL
  Filled 2024-02-21 (×3): qty 1

## 2024-02-21 MED ORDER — LEVOFLOXACIN IN D5W 500 MG/100ML IV SOLN
500.0000 mg | Freq: Once | INTRAVENOUS | Status: AC
  Administered 2024-02-21: 500 mg via INTRAVENOUS
  Filled 2024-02-21: qty 100

## 2024-02-21 MED ORDER — MECLIZINE HCL 25 MG PO TABS: 25.0000 mg | ORAL_TABLET | Freq: Three times a day (TID) | ORAL | Status: DC | PRN

## 2024-02-21 MED ORDER — HYDROCODONE-ACETAMINOPHEN 5-325 MG PO TABS
1.0000 | ORAL_TABLET | ORAL | Status: DC | PRN
  Administered 2024-02-21 – 2024-02-22 (×3): 2 via ORAL
  Filled 2024-02-21: qty 2
  Filled 2024-02-21: qty 1
  Filled 2024-02-21 (×2): qty 2

## 2024-02-21 MED ORDER — PANTOPRAZOLE SODIUM 40 MG PO TBEC
40.0000 mg | DELAYED_RELEASE_TABLET | Freq: Every day | ORAL | Status: DC
  Administered 2024-02-21 – 2024-02-23 (×3): 40 mg via ORAL
  Filled 2024-02-21 (×3): qty 1

## 2024-02-21 MED ORDER — HYDRALAZINE HCL 20 MG/ML IJ SOLN: 5.0000 mg | Freq: Four times a day (QID) | INTRAMUSCULAR | Status: DC | PRN

## 2024-02-21 MED ORDER — KETOROLAC TROMETHAMINE 15 MG/ML IJ SOLN
15.0000 mg | Freq: Four times a day (QID) | INTRAMUSCULAR | Status: DC | PRN
  Filled 2024-02-21: qty 1

## 2024-02-21 MED ORDER — ONDANSETRON HCL 4 MG/2ML IJ SOLN: 4.0000 mg | Freq: Four times a day (QID) | INTRAMUSCULAR | Status: DC | PRN

## 2024-02-21 MED ORDER — EZETIMIBE 10 MG PO TABS
10.0000 mg | ORAL_TABLET | Freq: Every day | ORAL | Status: DC
  Administered 2024-02-22 – 2024-02-23 (×2): 10 mg via ORAL
  Filled 2024-02-21 (×3): qty 1

## 2024-02-21 MED ORDER — VERAPAMIL HCL 40 MG PO TABS
160.0000 mg | ORAL_TABLET | Freq: Two times a day (BID) | ORAL | Status: DC
  Administered 2024-02-21 – 2024-02-23 (×4): 160 mg via ORAL
  Filled 2024-02-21 (×2): qty 4
  Filled 2024-02-21: qty 2
  Filled 2024-02-21 (×2): qty 4
  Filled 2024-02-21: qty 2

## 2024-02-21 MED ORDER — WARFARIN - PHARMACIST DOSING INPATIENT: Freq: Every day | Status: DC

## 2024-02-21 MED ORDER — MORPHINE SULFATE (PF) 2 MG/ML IV SOLN
2.0000 mg | INTRAVENOUS | Status: DC | PRN
  Administered 2024-02-21 – 2024-02-23 (×5): 2 mg via INTRAVENOUS
  Filled 2024-02-21 (×5): qty 1

## 2024-02-21 NOTE — ED Provider Notes (Signed)
 [place] EMERG[facility] DEPARTMENT AT [facility] Provider Note   CSN: [id number] Arrival date & time: 02/21/24  1113     History Chief Complaint  Patient presents with   Flank Pain    HPI: [name] is a 68 y.o. female with history pertinent for prior nephrolithiasis, elevated BMI, anemia, hyperlipidemia, T2DM who presents complaining of left-sided flank pain. Patient arrived via POV accompanied by husband.  History provided by patient.  No interpreter required during this encounter.  Patient reports that she has had 3 to 4 days of left-sided flank pain.  Reports that she has previously had nephrolithiasis, with overall similar symptoms.  Reports that she has had hematuria, dysuria, however no urgency, frequency, fevers, chills, chest pain, shortness of breath.  Reports that she has previously required lithotripsy.  Patient's recorded medical, surgical, social, medication list and allergies were reviewed in the Snapshot window as part of the initial history.   Prior to Admission medications   Medication Sig Start Date End Date Taking? Authorizing Provider  acetaminophen  (TYLENOL ) 650 MG CR tablet Take 1,300 mg by mouth every 8 (eight) hours as needed for pain.     [provider]  APPLE CIDER VINEGAR PO Take 450 mg by mouth in the morning.    [provider]  cetirizine (ZYRTEC) 10 MG tablet Take 10 mg by mouth in the morning.    [provider]  Cholecalciferol  (VITAMIN D3) 50 MCG (2000 UT) capsule Take 2,000 Units by mouth in the morning.    [provider]  colchicine  0.6 MG tablet Take 1 tablet (0.6 mg total) by mouth 2 (two) times daily for 5 days. 12/18/22 12/27/23  Lesia Ozell Barter, PA-C  Continuous Blood Gluc Sensor (FREESTYLE LIBRE 2 SENSOR) MISC APPLY ONE SENSOR TO THE BACK OF THE UPPER ARM EVERY 14 DAYS    [provider]  dicyclomine  (BENTYL ) 10 MG capsule TAKE 1 CAPSULE BY MOUTH 30 MINS BEFORE MEALS AND AT  BEDTIME AS NEEDED FOR PAIN/CRAMPING 12/23/18   Zehr, Jessica D, PA-C  enoxaparin  (LOVENOX ) 100 MG/ML injection Inject 1 mL (100 mg total) into the skin every 12 (twelve) hours. 12/31/23   Cindie Ole DASEN, MD  ezetimibe  (ZETIA ) 10 MG tablet TAKE 1 TABLET BY MOUTH EVERY DAY 07/26/23   Hilty, Vinie BROCKS, MD  FLOVENT  HFA 220 MCG/ACT inhaler Inhale 1 puff into the lungs 2 (two) times daily. 04/20/16   [provider]  fluticasone  (FLONASE ) 50 MCG/ACT nasal spray Place 1 spray into both nostrils 2 (two) times daily.  06/02/12   [provider]  isosorbide  mononitrate (IMDUR ) 30 MG 24 hr tablet Take 30 mg by mouth daily. 07/16/23   [provider]  losartan  (COZAAR ) 25 MG tablet Take 1 tablet (25 mg total) by mouth daily. 12/20/13   Allred, Lynwood, MD  meclizine  (ANTIVERT ) 25 MG tablet Take 1 tablet (25 mg total) by mouth 3 (three) times daily as needed for dizziness. 01/19/18   Randol Simmonds, MD  Multiple Vitamin (MULTIVITAMIN WITH MINERALS) TABS tablet Take 1 tablet by mouth in the morning.    [provider]  nitroGLYCERIN  (NITROSTAT ) 0.4 MG SL tablet PLACE 1 TABLET UNDER THE TONGUE EVERY 5 MINUTES AS NEEDED. 05/22/21   Leverne Charlies Helling, PA-C  omeprazole  (PRILOSEC) 40 MG capsule TAKE 1 CAPSULE BY MOUTH DAILY, CAN INCREASE TO 2 TIMES DAILY IF NEEDED 01/14/23   Kerman Vina HERO, NP  OZEMPIC, 1 MG/DOSE, 4 MG/3ML SOPN Inject 1 mg into the  skin once a week. Tuesdays 09/08/22   [provider]  polyethylene glycol (MIRALAX ) 17 g packet Take 17 g by mouth 2 (two) times daily. Titrate as needed Patient taking differently: Take 17 g by mouth 2 (two) times daily as needed (constipation.). 01/23/21   Armbruster, Elspeth SQUIBB, MD  PROAIR  HFA 108 (90 BASE) MCG/ACT inhaler Inhale 2 puffs into the lungs every 6 (six) hours as needed for wheezing or shortness of breath.  03/22/12   [provider]  scopolamine  (TRANSDERM-SCOP) 1 MG/3DAYS Place 1 patch onto the skin every 3 (three)  days. 05/04/23   [provider]  venlafaxine  XR (EFFEXOR -XR) 37.5 MG 24 hr capsule Take 37.5 mg by mouth daily with breakfast.  11/21/12   [provider]  verapamil  (CALAN ) 80 MG tablet Take 160 mg by mouth 2 (two) times daily.    [provider]  warfarin (COUMADIN ) 2 MG tablet TAKE 4MG  DAILY EXCEPT 6MG  ON TUESDAYS, THURSDAYS,AND SATURDAYS AS DIRECTED BY ANTICOAGULATION CLINIC 02/10/24   Lonni Slain, MD     Allergies: Aspirin, Banana, Food, Iodinated contrast media, Latex, Meperidine  hcl, Peanut-containing drug products, Penicillins, Statins, Sulfonamide derivatives, Tape, Alfuzosin, Halothane, Other, and Sulfamethoxazole   Review of Systems   ROS as per HPI  Physical Exam Updated Vital Signs BP (!) 148/72 (BP Location: Right Arm)   Pulse 70   Temp 97.9 F (36.6 C) (Oral)   Resp 18   SpO2 91%  Physical Exam Vitals and nursing note reviewed.  Constitutional:      General: She is not in acute distress.    Appearance: Normal appearance. She is well-developed.  HENT:     Head: Normocephalic and atraumatic.  Eyes:     Extraocular Movements: Extraocular movements intact.     Conjunctiva/sclera: Conjunctivae normal.  Cardiovascular:     Rate and Rhythm: Normal rate and regular rhythm.     Pulses: Normal pulses.     Heart sounds: No murmur heard. Pulmonary:     Effort: Pulmonary effort is normal. No respiratory distress.     Breath sounds: Normal breath sounds.  Abdominal:     Palpations: Abdomen is soft.     Tenderness: There is no abdominal tenderness. There is left CVA tenderness. There is no right CVA tenderness.  Musculoskeletal:        General: No swelling.     Cervical back: Neck supple.  Skin:    General: Skin is warm and dry.     Capillary Refill: Capillary refill takes less than 2 seconds.  Neurological:     General: No focal deficit present.     Mental Status: She is alert and oriented to person, place, and time.  Psychiatric:         Mood and Affect: Mood normal.     ED Course/ Medical Decision Making/ A&P    Procedures Procedures   Medications Ordered in ED Medications  morphine  (PF) 2 MG/ML injection 2 mg (2 mg Intravenous Given 02/21/24 1614)  morphine  (PF) 4 MG/ML injection 4 mg (4 mg Intravenous Given 02/21/24 1401)  lactated ringers  bolus 1,000 mL (0 mLs Intravenous Stopped 02/21/24 1635)  levofloxacin (LEVAQUIN) IVPB 500 mg (0 mg Intravenous Stopped 02/21/24 1635)  ketorolac  (TORADOL ) 15 MG/ML injection 15 mg (15 mg Intravenous Given 02/21/24 1613)    Medical Decision Making:   MALAISHA SILLIMAN is a 68 y.o. female who presents for left-sided flank pain as per above.  Physical exam is pertinent for left CVA tenderness to  palpation.   The differential includes but is not limited to cystitis, pyelonephritis, nephrolithiasis, sepsis.  Independent historian: None  External data reviewed: No pertinent external data  Initial Plan:  Screening labs including CBC and Metabolic panel to evaluate for infectious or metabolic etiology of disease.  Lactic acid to evaluate for sepsis Blood cultures to evaluate for sepsis/bacteremia Urinalysis with reflex culture ordered to evaluate for UTI or relevant urologic/nephrologic pathology.  CT stone protocol to evaluate for structural/infectious intra-abdominal pathology. Objective evaluation as below reviewed   Labs: Ordered, Independent interpretation, and Details: UA with present LE, significant WBC, RBC concerning for UTI.  CBC without leukocytosis, anemia, thrombocytopenia.  CMP without AKI, emergent electro gentian, emergent LFT abnormality  Radiology: Ordered, Independent interpretation, Details: -Reviewed CT of abdomen pelvis, do appreciate reviewed dense nephrolithiasis at the left UPJ without significant hydro, and All images reviewed independently.  Agree with radiology report at this time.   CT Renal Stone Study Result Date: 02/21/2024 CLINICAL DATA:   Abdominal pain.  Left-sided flank pain. EXAM: CT ABDOMEN AND PELVIS WITHOUT CONTRAST TECHNIQUE: Multidetector CT imaging of the abdomen and pelvis was performed following the standard protocol without IV contrast. RADIATION DOSE REDUCTION: This exam was performed according to the departmental dose-optimization program which includes automated exposure control, adjustment of the mA and/or kV according to patient size and/or use of iterative reconstruction technique. COMPARISON:  11/21/2020. FINDINGS: Lower chest: No acute abnormality. Hepatobiliary: No suspicious focal hepatic lesion identified within the limits of an unenhanced exam. Status post cholecystectomy. No biliary dilatation. Pancreas: Unremarkable. No pancreatic ductal dilatation or surrounding inflammatory changes. Spleen: Normal in size without focal abnormality. Adrenals/Urinary Tract: There is a 9 x 8 mm calculus at the left ureteropelvic junction with mild dilation of the left renal pelvis. No frank hydronephrosis. Additional nonobstructive left renal calculi measuring up to 5 mm at the inferior pole. Nonobstructive right renal calculi with the largest measuring up to 7 x 5 mm at the interpolar right kidney. Bladder is unremarkable. Stomach/Bowel: Small hiatal hernia again noted. Small bowel is grossly unremarkable. No obstruction or inflammatory changes. Sigmoid colonic anastomosis. Vascular/Lymphatic: Abdominal aorta is normal in caliber with scattered atherosclerotic calcification. No enlarged abdominal or pelvic lymph nodes. Reproductive: Uterus and bilateral adnexa are unremarkable. Other: No abdominopelvic ascites. No intraperitoneal free air. Redemonstrated focal nodularity within the subcutaneous fat of the ventral abdominal wall, likely reflecting injection sites. No loculated fluid collection. Musculoskeletal: No acute osseous abnormality. No suspicious osseous lesion. IMPRESSION: 1. 9 mm calculus at the left ureteropelvic junction with mild  dilation of the left renal pelvis. No frank hydronephrosis. 2. Additional nonobstructive left renal calculi measuring up to 5 mm. 3. Nonobstructive right renal calculi measuring up to 7 mm. 4.  Aortic Atherosclerosis (ICD10-I70.0). Electronically Signed   By: Harrietta Sherry M.D.   On: 02/21/2024 13:10    EKG/Medicine tests: Not indicated EKG Interpretation:    Interventions: Morphine , Levaquin, LR bolus, Toradol   See the EMR for full details regarding lab and imaging results.  Patient presents to the emergency department for flank pain and dysuria.  Patient hemodynamically stable, afebrile, has CT scan which does demonstrate UPJ stone of 9 mm without upstream hydronephrosis.  Given patient has dysuria, flank pain, significant WBCs and LE on UA suspect that patient has pyelonephritis given patient does not have hydronephrosis to explain CVA tenderness.  Given patient's age, highly vascular nature of kidneys, do feel that patient warrants admission for close monitoring and IV antibiotics in the setting  of pyelonephritis, and potentially infected stone.  Patient does not have acute urinary obstruction at this time.  Cephalosporins not administered given patient has anaphylaxis.  Did consult urology and spoke with Cam Sattenfield, NP, who did not feel that patient requires emergent procedural intervention or admission from a urologic perspective, and did not have any further recommendations currently for acute management.  Did note that CT imaging currently is not definitive for pyelonephritis, and that a contrasted CT would be more specific for pyelonephritis.  Unfortunately patient has a documented anaphylactic allergy to contrast, thus given clinical symptoms and physical exam, do feel that presentation is concerning enough for pyelonephritis that patient requires admission for observation and IV antibiotics, particularly given patient has had symptomatic nephrolithiasis for several days yet stone has  only progressed to the level of the UPJ.  Per urology, hospitalist/medicine team can reach back out as needed for further urologic concerns if needed.  Also consulted hospitalist for admission, discussed with Dr. Fausto who accepted the patient to her service.  Presentation is most consistent with acute complicated illness and Current presentation is complicated by underlying chronic conditions  Discussion of management or test interpretations with external provider(s): Delia, NP, Dr Fausto, hospitalist  Risk Drugs:Prescription drug management and Parenteral controlled substances Treatment: Decision regarding hospitalization  Disposition: Admit to hospitalists   MDM generated using voice dictation software and may contain dictation errors.  Please contact me for any clarification or with any questions.  Clinical Impression:  1. Pyelonephritis   2. Nephrolithiasis      Admit   Final Clinical Impression(s) / ED Diagnoses Final diagnoses:  Pyelonephritis  Nephrolithiasis    Rx / DC Orders ED Discharge Orders     None        Rogelia Jerilynn RAMAN, MD 02/21/24 [phone]

## 2024-02-21 NOTE — Assessment & Plan Note (Signed)
 History of nephrolithiasis Patient presents with 3 to 4 days left-sided flank pain, hematuria and chills.  9 mm ureteropelvic junction stone seen on CT renal stone protocol in addition to 5 mm nonobstructing left renal stone and 7 mm nonobstructing right renal stone.  -- Continue Levaquin --Follow blood and urine cultures --EDP spoke with urology and no procedural intervention was recommended at this time given no frank hydronephrosis or signs of obstruction on imaging.   --Monitor and consult urology if indicated --Monitor fever curve, CBC --Pain control as needed

## 2024-02-21 NOTE — ED Triage Notes (Signed)
 Pt c/o L sided back/ flank pain onset Friday evening. Hx stones, advises pain is the same. [facility], unable to get appt.

## 2024-02-21 NOTE — Assessment & Plan Note (Signed)
 Continue home meclizine  as needed

## 2024-02-21 NOTE — Assessment & Plan Note (Signed)
Continue warfarin per pharmacy. ?

## 2024-02-21 NOTE — Assessment & Plan Note (Signed)
 Hemoglobin stable at 14.1 on admission --Monitor CBC

## 2024-02-21 NOTE — Assessment & Plan Note (Signed)
--   Continue Effexor  (confirm when med history complete)

## 2024-02-21 NOTE — Assessment & Plan Note (Addendum)
 Claritin  substituted for home Zyrtec Continue Flonase 

## 2024-02-21 NOTE — Assessment & Plan Note (Addendum)
 BP elevated on arrival 182/95 improved to 148/72, suspect exacerbated by pain. --Med history pending, confirm regimen --Resumed losartan , verapamil  --IV hydralazine  as needed

## 2024-02-21 NOTE — Assessment & Plan Note (Addendum)
 CHA2DS2-VASc score is 5. Heart rate controlled on admission --Continue warfarin per pharmacy -- Resume verapamil  (confirm once med history is complete)

## 2024-02-21 NOTE — Assessment & Plan Note (Addendum)
 Last available A1c was in October 2023, 7.2% --Repeat A1c --Sliding scale NovoLog -- Med history pending, appears patient on Ozempic --Monitor CBGs and titrate regimen

## 2024-02-21 NOTE — Assessment & Plan Note (Signed)
 Med history pending but appears patient not on statin

## 2024-02-21 NOTE — ED Notes (Signed)
 [facility] at bedside

## 2024-02-21 NOTE — H&P (Addendum)
 Telemedicine History and Physical    Patient: [name] FMW:[id number] DOB: May 21, 1955 DOA: 02/21/2024 DOS: the patient was seen and examined on 02/21/2024 PCP: Seabron Lenis, MD    Referring Provider: Willow Later, MD Telemedicine Provider: Burnard Cunning, DO Patient Location: [facility] Referring Diagnosis: Complicated UTI Patient Name and DOB verified: [name], Mar 01, 1956 Patient consented to Telemedicine Evaluation:Yes RN virtual assistant: Rocky Jobs, RN Video encounter time and date: 02/21/2024 @ 1706   Patient coming from: Home  Chief Complaint:  Chief Complaint  Patient presents with   Flank Pain   HPI: [name] is a 68 y.o. female with medical history significant of prior nephrolithiasis, anemia, hyperlipidemia, type 2 diabetes who presented to [facility] today for evaluation of left-sided flank pain.  Patient reports onset of flank pain 3 to 4 days ago with symptoms resembling her prior episodes of kidney stones.  She reports also having hematuria and dysuria.  She reports some intermittent chills but no fevers.  Also reports some nausea but without vomiting and also diminished appetite over the past few days.  She recent illnesses or symptoms including cough, sore throat, congestion, shortness of breath, chest pain, palpitations, neck pain, diarrhea unilateral weakness numbness or tingling, headaches.  Patient reports attempting to get into be seen with [facility] but was unable to get an appointment, therefore presented to the ED for evaluation.  ED course -- Initial vitals: Afebrile temp 98.2 F, HR 67, RR 16, BP elevated 182/95 later improved to 148/72, SpO2 97% on room air. Labs obtained including CMP and CBC were notable only for nonfasting glucose 128 and calcium  10.4. CBC was normal including no leukocytosis. Lactic acid was initially 2.0, improved to 0.7 after IV fluids. Urinalysis: Hazy, large hemoglobin, small  leukocytes, no bacteria, greater than 50 WBCs. Imaging: CT renal stone protocol showed a 9 mm left ureteropelvic junction stone with mild dilation of the left renal pelvis but no frank hydronephrosis.  There was an additional nonobstructive left renal calculus measuring 5 mm and nonobstructive right calculus measuring 7 mm.  ED provider reports discussing the case with on-call urologist who recommended no interventional procedures needed at this time.   Blood culture x 2 and urine culture were sent. Patient was treated with IV Levaquin  (based on antibiotic allergy profile), 1 L bolus LR fluids, IV morphine , IV Toradol .  Patient is admitted to the hospital for further evaluation and management of complicated UTI including IV antibiotics and close observation pending urine and blood culture results as outlined in detail below.   Review of Systems: As mentioned in the history of present illness. All other systems reviewed and are negative.   Past Medical History:  Diagnosis Date   Allergic rhinitis    Anginal pain    Arthritis    back (05/07/2016)   Asthma    Atrial flutter (HCC)    a. Remotely per notes.   Carpal tunnel syndrome, bilateral    Colon polyps    Diverticulitis    Dizziness    DVT (deep venous thrombosis) (HCC) 1980s x2 -    between birth of two sons. Saw hematology - was told she has a hypercoagulable disorder, should be on Coumadin  lifelong.   Dysrhythmia    atrial fib   Family history of adverse reaction to anesthesia    mother gets PONV   Gallbladder disease    GERD (gastroesophageal reflux disease)    GIB (gastrointestinal bleeding) 04/14/2018   Halothane adverse  reaction    narrow small opening   Heart murmur    Hiatal hernia    History of hiatal hernia    small one/CTA 05/01/2016 (05/07/2016)   History of kidney stones    HTN (hypertension)    Hypercholesteremia    hx; brought it down w/diet (05/07/2016)   Hyperglycemia    a. A1c 6.1 in 2014.    Inguinal hernia    Iron  deficiency anemia due to chronic blood loss 04/14/2018   Iron  malabsorption 04/14/2018   Left sided sciatica    Normal cardiac stress test 06/2012   Obesity    OSA (obstructive sleep apnea)    a. Mild, did not tolerate CPAP (05/07/2016)   PAF (paroxysmal atrial fibrillation) (HCC)    a. s/p afib ablation at [facility] 2010 (Dr. Arlon). b. Recurrent AF s/p DCCV 06/2010. c. On flecainide .    Paroxysmal atrial fibrillation (HCC) 09/09/2009   Qualifier: Diagnosis of  By: Lawernce CMA, Jewel     PONV (postoperative nausea and vomiting)    PPD positive, treated 1977   treated for 1 yr after exposure to patient   Pre-diabetes    RSV infection ~ 2006   Vertigo    Wandering (atrial) pacemaker    a. Remotely per notes.  (  pt. states has a wandering p wave ) no pacemaker   Past Surgical History:  Procedure Laterality Date   ANKLE ARTHODESIS W/ ARTHROSCOPY Right 11/12/2020   ATRIAL ABLATION SURGERY  05/07/2016   fib and flutter   ATRIAL FIBRILLATION ABLATION  2010   at [facility]   ATRIAL FIBRILLATION ABLATION N/A 05/07/2016   Procedure: Atrial Fibrillation Ablation;  Surgeon: Lynwood Rakers, MD;  Location: [facility] INVASIVE CV LAB;  Service: Cardiovascular;  Laterality: N/A;   ATRIAL FIBRILLATION ABLATION N/A 12/18/2022   Procedure: ATRIAL FIBRILLATION ABLATION;  Surgeon: Cindie Ole DASEN, MD;  Location: [facility] INVASIVE CV LAB;  Service: Cardiovascular;  Laterality: N/A;   BREAST CYST ASPIRATION Bilateral    CARDIAC CATHETERIZATION  2008   CARPAL TUNNEL RELEASE Bilateral    CATARACT EXTRACTION Bilateral 2022   CESAREAN SECTION  1986; 1988   COLON RESECTION     COLONOSCOPY  X 2   COLONOSCOPY W/ BIOPSIES AND POLYPECTOMY  X 2   CYSTOSCOPY/URETEROSCOPY/HOLMIUM LASER/STENT PLACEMENT Left 12/23/2019   Procedure: CYSTOSCOPY/URETEROSCOPY/HOLMIUM LASER/STENT PLACEMENT;  Surgeon: Elisabeth Valli BIRCH, MD;  Location: [facility] ORS;  Service: Urology;  Laterality: Left;    ESOPHAGOGASTRODUODENOSCOPY     ESOPHAGOGASTRODUODENOSCOPY (EGD) WITH PROPOFOL  N/A 07/19/2017   Procedure: ESOPHAGOGASTRODUODENOSCOPY (EGD) WITH PROPOFOL ;  Surgeon: Leigh Elspeth SQUIBB, MD;  Location: [facility] ENDOSCOPY;  Service: Gastroenterology;  Laterality: N/A;   INGUINAL HERNIA REPAIR Right    KNEE ARTHROSCOPY Right 06/10/2021   Procedure: RIGHT KNEE ARTHROSCOPY;  Surgeon: Sheril Coy, MD;  Location: [facility] ORS;  Service: Orthopedics;  Laterality: Right;   LAPAROSCOPIC CHOLECYSTECTOMY  1989   POLYPECTOMY N/A 07/19/2017   Procedure: POLYPECTOMY;  Surgeon: Leigh Elspeth SQUIBB, MD;  Location: [facility];  Service: Gastroenterology;  Laterality: N/A;   PROCTOSCOPY N/A 08/06/2017   Procedure: PROCTOSCOPY;  Surgeon: Sheldon Elspeth, MD;  Location: [facility];  Service: General;  Laterality: N/A;   TMJ ARTHROPLASTY     TOTAL KNEE ARTHROPLASTY Right 01/06/2022   Procedure: RIGHT TOTAL KNEE ARTHROPLASTY;  Surgeon: Sheril Coy, MD;  Location: [facility];  Service: Orthopedics;  Laterality: Right;   TUBAL LIGATION  1988   Social History:  reports that she has never smoked. She has been exposed  to tobacco smoke. She has never used smokeless tobacco. She reports that she does not currently use alcohol. She reports that she does not use drugs.  Allergies  Allergen Reactions   Aspirin Other (See Comments)    Makes asthma worse and makes stomach hurt   Banana Swelling and Cough    Inside of the mouth swells   Food Other (See Comments) and Cough    Nuts- mucus membranes inflamed/cough    Iodinated Contrast Media Anaphylaxis    Required epinephrine . Since then, has tolerated with premed. Other reaction(s): wheeze...requires premdication with prednisone  3 days in advance   Latex Hives and Cough    Wheezing/cough  Other reaction(s): hives/wheeze   Meperidine  Hcl Other (See Comments)    Projectile vomiting    Peanut-Containing Drug Products Swelling and Cough    Inside of the mouth swells   Penicillins  Anaphylaxis    Has patient had a PCN reaction causing immediate rash, facial/tongue/throat swelling, SOB or lightheadedness with hypotension:Yes Has patient had a PCN reaction causing severe rash involving mucus membranes or skin necrosis:Yes Has patient had a PCN reaction that required hospitalization:Treated in ER w/epi & breathing treatments Has patient had a PCN reaction occurring within the last 10 years:No If all of the above answers are NO, then may proceed with Cephalosporin use.    Statins Other (See Comments)    Severe leg pain - has tried most of them   Sulfonamide Derivatives Anaphylaxis   Tape Hives and Dermatitis    No plastic tape!!   Alfuzosin Other (See Comments)    Headache, dizziness   Halothane     Listed under anesthesia adverse reactions pt does not know what reaction or if any at preop on 05/26/21.    Other Other (See Comments)    Vascepa- upset stomach   Sulfamethoxazole Hives    Family History  Problem Relation Age of Onset   Hypertension Mother    Stroke Mother    Heart attack Mother        2 previous MIs, 3 stents - CAD first diagnosed 53   Thyroid  disease Mother    Hyperlipidemia Father    Colon polyps Father    Lupus Sister    Thyroid  disease Sister        x 2   Hyperlipidemia Maternal Grandmother    Heart attack Maternal Grandmother    Diabetes Maternal Grandmother    Hypertension Maternal Grandmother    Hypertension Maternal Grandfather    Stroke Maternal Grandfather        Multiple family members   Stroke Paternal Grandmother    Heart attack Paternal Grandfather        Died at 66 of massive MI   Colon cancer Neg Hx    Stomach cancer Neg Hx     Prior to Admission medications   Medication Sig Start Date End Date Taking? Authorizing Provider  acetaminophen  (TYLENOL ) 650 MG CR tablet Take 1,300 mg by mouth every 8 (eight) hours as needed for pain.     [provider]  APPLE CIDER VINEGAR PO Take 450 mg by mouth in the morning.     [provider]  cetirizine (ZYRTEC) 10 MG tablet Take 10 mg by mouth in the morning.    [provider]  Cholecalciferol  (VITAMIN D3) 50 MCG (2000 UT) capsule Take 2,000 Units by mouth in the morning.    [provider]  colchicine  0.6 MG tablet Take 1 tablet (0.6 mg total) by  mouth 2 (two) times daily for 5 days. 12/18/22 12/27/23  Lesia Ozell Barter, PA-C  Continuous Blood Gluc Sensor (FREESTYLE LIBRE 2 SENSOR) MISC APPLY ONE SENSOR TO THE BACK OF THE UPPER ARM EVERY 14 DAYS    [provider]  dicyclomine  (BENTYL ) 10 MG capsule TAKE 1 CAPSULE BY MOUTH 30 MINS BEFORE MEALS AND AT BEDTIME AS NEEDED FOR PAIN/CRAMPING 12/23/18   Zehr, Jessica D, PA-C  enoxaparin  (LOVENOX ) 100 MG/ML injection Inject 1 mL (100 mg total) into the skin every 12 (twelve) hours. 12/31/23   Cindie Ole DASEN, MD  ezetimibe  (ZETIA ) 10 MG tablet TAKE 1 TABLET BY MOUTH EVERY DAY 07/26/23   Hilty, Vinie BROCKS, MD  FLOVENT  HFA 220 MCG/ACT inhaler Inhale 1 puff into the lungs 2 (two) times daily. 04/20/16   [provider]  fluticasone  (FLONASE ) 50 MCG/ACT nasal spray Place 1 spray into both nostrils 2 (two) times daily.  06/02/12   [provider]  isosorbide  mononitrate (IMDUR ) 30 MG 24 hr tablet Take 30 mg by mouth daily. 07/16/23   [provider]  losartan  (COZAAR ) 25 MG tablet Take 1 tablet (25 mg total) by mouth daily. 12/20/13   Allred, Lynwood, MD  meclizine  (ANTIVERT ) 25 MG tablet Take 1 tablet (25 mg total) by mouth 3 (three) times daily as needed for dizziness. 01/19/18   Randol Simmonds, MD  Multiple Vitamin (MULTIVITAMIN WITH MINERALS) TABS tablet Take 1 tablet by mouth in the morning.    [provider]  nitroGLYCERIN  (NITROSTAT ) 0.4 MG SL tablet PLACE 1 TABLET UNDER THE TONGUE EVERY 5 MINUTES AS NEEDED. 05/22/21   Leverne Charlies Helling, PA-C  omeprazole  (PRILOSEC) 40 MG capsule TAKE 1 CAPSULE BY MOUTH DAILY, CAN INCREASE TO 2 TIMES DAILY IF NEEDED 01/14/23    Kerman Vina HERO, NP  OZEMPIC, 1 MG/DOSE, 4 MG/3ML SOPN Inject 1 mg into the skin once a week. Tuesdays 09/08/22   [provider]  polyethylene glycol (MIRALAX ) 17 g packet Take 17 g by mouth 2 (two) times daily. Titrate as needed Patient taking differently: Take 17 g by mouth 2 (two) times daily as needed (constipation.). 01/23/21   Armbruster, Elspeth SQUIBB, MD  PROAIR  HFA 108 (90 BASE) MCG/ACT inhaler Inhale 2 puffs into the lungs every 6 (six) hours as needed for wheezing or shortness of breath.  03/22/12   [provider]  scopolamine  (TRANSDERM-SCOP) 1 MG/3DAYS Place 1 patch onto the skin every 3 (three) days. 05/04/23   [provider]  venlafaxine  XR (EFFEXOR -XR) 37.5 MG 24 hr capsule Take 37.5 mg by mouth daily with breakfast.  11/21/12   [provider]  verapamil  (CALAN ) 80 MG tablet Take 160 mg by mouth 2 (two) times daily.    [provider]  warfarin (COUMADIN ) 2 MG tablet TAKE 4MG  DAILY EXCEPT 6MG  ON TUESDAYS, THURSDAYS,AND SATURDAYS AS DIRECTED BY ANTICOAGULATION CLINIC 02/10/24   Lonni Slain, MD    Physical Exam: Vitals:   02/21/24 1730 02/21/24 1745 02/21/24 1819 02/21/24 2001  BP: (!) 162/78 123/66 (!) 160/80 (!) 128/58  Pulse: (!) 58 (!) 54 (!) 56 62  Resp:   18   Temp:   98.1 F (36.7 C) 98.2 F (36.8 C)  TempSrc:   Oral   SpO2: 93% 92% 96% 94%   Bedside RN listed above performed the physical exam. Below exam findings are based on bedside evaluation and my observations during virtual encounter.  General exam: awake, alert, no acute distress HEENT: Wearing glasses, hearing grossly  normal  Respiratory system: CTAB, no wheezes, rales or rhonchi, normal respiratory effort. Cardiovascular system: normal S1/S2, RRR, no JVD, murmurs, rubs, gallops, no pedal edema.   Gastrointestinal system: soft, NT, ND, hyperactive bowel sounds. Central nervous system: A&O x 4. no gross focal neurologic deficits, normal speech Extremities:  moves all, no edema, normal tone Skin: No rashes seen on visualized skin Psychiatry: normal mood, congruent affect, judgement and insight appear normal    Data Reviewed:  As outlined in detail above   Assessment and Plan:  * Complicated UTI (urinary tract infection) History of nephrolithiasis Patient presents with 3 to 4 days left-sided flank pain, hematuria and chills.  9 mm ureteropelvic junction stone seen on CT renal stone protocol in addition to 5 mm nonobstructing left renal stone and 7 mm nonobstructing right renal stone.  -- Continue Levaquin --Follow blood and urine cultures --EDP spoke with urology and no procedural intervention was recommended at this time given no frank hydronephrosis or signs of obstruction on imaging.   --Monitor and consult urology if indicated --Monitor fever curve, CBC --Pain control as needed  Type 2 diabetes mellitus with hyperglycemia (HCC) Last available A1c was in October 2023, 7.2% --Repeat A1c --Sliding scale NovoLog -- Med history pending, appears patient on Ozempic --Monitor CBGs and titrate regimen  Essential hypertension BP elevated on arrival 182/95 improved to 148/72, suspect exacerbated by pain. --Med history pending, confirm regimen --Resumed losartan , verapamil  --IV hydralazine  as needed  Paroxysmal atrial fibrillation (HCC) CHA2DS2-VASc score is 5. Heart rate controlled on admission --Continue warfarin per pharmacy -- Resume verapamil  (confirm once med history is complete)  Vertigo Continue home meclizine  as needed  Mixed hyperlipidemia Med history pending but appears patient not on statin  Allergic rhinitis Claritin  substituted for home Zyrtec Continue Flonase   Iron  deficiency anemia due to chronic blood loss Hemoglobin stable at 14.1 on admission --Monitor CBC  Chronic anticoagulation (warfarin for Afib) Continue warfarin per pharmacy  GERD Continue PPI (Confirm when med history complete)  Mood  disorder -- Continue Effexor  (confirm when med history complete)  History of DVT (deep vein thrombosis) Continue warfarin per pharmacy    Please confirm home medications ordered once med history is completed by pharmacy.      Advance Care Planning: CODE STATUS -- full code  Consults: None  Family Communication: None present during virtual encounter.  Patient updated in detail and agreeable with the plan  Severity of Illness: The appropriate patient status for this patient is OBSERVATION. Observation status is judged to be reasonable and necessary in order to provide the required intensity of service to ensure the patient's safety. The patient's presenting symptoms, physical exam findings, and initial radiographic and laboratory data in the context of their medical condition is felt to place them at decreased risk for further clinical deterioration. Furthermore, it is anticipated that the patient will be medically stable for discharge from the hospital within 2 midnights of admission.   Author: Burnard DELENA Cunning, DO 02/21/2024 8:11 PM  For on call review [email or web address].

## 2024-02-21 NOTE — Progress Notes (Signed)
 [name] is a 68 y.o. female with medical history significant of prior nephrolithiasis, anemia, hyperlipidemia, type 2 diabetes who presented to [facility] ED today for evaluation of left-sided flank pain and hematuria for last 3 days, however she denied dysuria when asked.  Per previous notes, she also had dysuria.  She has been admitted by virtual hospitalist.  I have reviewed H&P thoroughly as well as patient's labs and vitals.  Per policy, being onsite physician, I was asked by patient placement to lay eyes on the patient.  I saw patient.  She looked very comfortable.  Although she was still complaining of 6 out of 10 left flank pain.  No other complaint.  Overall feels better than earlier.  She had no questions for me and currently had no concerns.  Remains on Levaquin for UTI.  On examination, lungs clear to auscultation, abdomen soft and nontender but she did have positive left CVA tenderness.

## 2024-02-21 NOTE — Assessment & Plan Note (Signed)
 Continue PPI (Confirm when med history complete)

## 2024-02-21 NOTE — Progress Notes (Addendum)
 PHARMACY - ANTICOAGULATION CONSULT NOTE  Pharmacy Consult for warfarin Indication: atrial fibrillation and h/o DVT  Allergies  Allergen Reactions   Aspirin Other (See Comments)    Makes asthma worse and makes stomach hurt   Banana Swelling and Cough    Inside of the mouth swells   Food Other (See Comments) and Cough    Nuts- mucus membranes inflamed/cough    Iodinated Contrast Media Anaphylaxis    Required epinephrine . Since then, has tolerated with premed. Other reaction(s): wheeze...requires premdication with prednisone  3 days in advance   Latex Hives and Cough    Wheezing/cough  Other reaction(s): hives/wheeze   Meperidine  Hcl Other (See Comments)    Projectile vomiting    Peanut-Containing Drug Products Swelling and Cough    Inside of the mouth swells   Penicillins Anaphylaxis    Has patient had a PCN reaction causing immediate rash, facial/tongue/throat swelling, SOB or lightheadedness with hypotension:Yes Has patient had a PCN reaction causing severe rash involving mucus membranes or skin necrosis:Yes Has patient had a PCN reaction that required hospitalization:Treated in ER w/epi & breathing treatments Has patient had a PCN reaction occurring within the last 10 years:No If all of the above answers are NO, then may proceed with Cephalosporin use.    Statins Other (See Comments)    Severe leg pain - has tried most of them   Sulfonamide Derivatives Anaphylaxis   Tape Hives and Dermatitis    No plastic tape!!   Alfuzosin Other (See Comments)    Headache, dizziness   Halothane     Listed under anesthesia adverse reactions pt does not know what reaction or if any at preop on 05/26/21.    Other Other (See Comments)    Vascepa- upset stomach   Sulfamethoxazole Hives    Patient Measurements:    Vital Signs: Temp: 98.2 F (36.8 C) (12/01 2001) Temp Source: Oral (12/01 1819) BP: 128/58 (12/01 2001) Pulse Rate: 62 (12/01 2001)  Labs: Recent Labs    02/21/24 1355   HGB 14.1  HCT 41.7  PLT 354  CREATININE 0.61    CrCl cannot be calculated (Unknown ideal weight.).   Medical History: Past Medical History:  Diagnosis Date   Allergic rhinitis    Anginal pain    Arthritis    back (05/07/2016)   Asthma    Atrial flutter (HCC)    a. Remotely per notes.   Carpal tunnel syndrome, bilateral    Colon polyps    Diverticulitis    Dizziness    DVT (deep venous thrombosis) (HCC) 1980s x2 -    between birth of two sons. Saw hematology - was told she has a hypercoagulable disorder, should be on Coumadin  lifelong.   Dysrhythmia    atrial fib   Family history of adverse reaction to anesthesia    mother gets PONV   Gallbladder disease    GERD (gastroesophageal reflux disease)    GIB (gastrointestinal bleeding) 04/14/2018   Halothane adverse reaction    narrow small opening   Heart murmur    Hiatal hernia    History of hiatal hernia    small one/CTA 05/01/2016 (05/07/2016)   History of kidney stones    HTN (hypertension)    Hypercholesteremia    hx; brought it down w/diet (05/07/2016)   Hyperglycemia    a. A1c 6.1 in 2014.   Inguinal hernia    Iron  deficiency anemia due to chronic blood loss 04/14/2018   Iron  malabsorption 04/14/2018   Left sided  sciatica    Normal cardiac stress test 06/2012   Obesity    OSA (obstructive sleep apnea)    a. Mild, did not tolerate CPAP (05/07/2016)   PAF (paroxysmal atrial fibrillation) (HCC)    a. s/p afib ablation at [facility] 2010 (Dr. Arlon). b. Recurrent AF s/p DCCV 06/2010. c. On flecainide .    Paroxysmal atrial fibrillation (HCC) 09/09/2009   Qualifier: Diagnosis of  By: Lawernce CMA, Jewel     PONV (postoperative nausea and vomiting)    PPD positive, treated 1977   treated for 1 yr after exposure to patient   Pre-diabetes    RSV infection ~ 2006   Vertigo    Wandering (atrial) pacemaker    a. Remotely per notes.  (  pt. states has a wandering p wave ) no pacemaker    Medications:   Medications Prior to Admission  Medication Sig Dispense Refill Last Dose/Taking   acetaminophen  (TYLENOL ) 650 MG CR tablet Take 1,300 mg by mouth every 8 (eight) hours as needed for pain.       APPLE CIDER VINEGAR PO Take 450 mg by mouth in the morning.      cetirizine (ZYRTEC) 10 MG tablet Take 10 mg by mouth in the morning.      Cholecalciferol  (VITAMIN D3) 50 MCG (2000 UT) capsule Take 2,000 Units by mouth in the morning.      colchicine  0.6 MG tablet Take 1 tablet (0.6 mg total) by mouth 2 (two) times daily for 5 days. 10 tablet 0    Continuous Blood Gluc Sensor (FREESTYLE LIBRE 2 SENSOR) MISC APPLY ONE SENSOR TO THE BACK OF THE UPPER ARM EVERY 14 DAYS      dicyclomine  (BENTYL ) 10 MG capsule TAKE 1 CAPSULE BY MOUTH 30 MINS BEFORE MEALS AND AT BEDTIME AS NEEDED FOR PAIN/CRAMPING 360 capsule 1    enoxaparin  (LOVENOX ) 100 MG/ML injection Inject 1 mL (100 mg total) into the skin every 12 (twelve) hours. 17 mL 0    ezetimibe  (ZETIA ) 10 MG tablet TAKE 1 TABLET BY MOUTH EVERY DAY 90 tablet 3    FLOVENT  HFA 220 MCG/ACT inhaler Inhale 1 puff into the lungs 2 (two) times daily.  1    fluticasone  (FLONASE ) 50 MCG/ACT nasal spray Place 1 spray into both nostrils 2 (two) times daily.       isosorbide  mononitrate (IMDUR ) 30 MG 24 hr tablet Take 30 mg by mouth daily.      losartan  (COZAAR ) 25 MG tablet Take 1 tablet (25 mg total) by mouth daily. 90 tablet 0    meclizine  (ANTIVERT ) 25 MG tablet Take 1 tablet (25 mg total) by mouth 3 (three) times daily as needed for dizziness. 30 tablet 0    Multiple Vitamin (MULTIVITAMIN WITH MINERALS) TABS tablet Take 1 tablet by mouth in the morning.      nitroGLYCERIN  (NITROSTAT ) 0.4 MG SL tablet PLACE 1 TABLET UNDER THE TONGUE EVERY 5 MINUTES AS NEEDED. 25 tablet 11    omeprazole  (PRILOSEC) 40 MG capsule TAKE 1 CAPSULE BY MOUTH DAILY, CAN INCREASE TO 2 TIMES DAILY IF NEEDED 180 capsule 0    OZEMPIC, 1 MG/DOSE, 4 MG/3ML SOPN Inject 1 mg into the skin once a week.  Tuesdays      polyethylene glycol (MIRALAX ) 17 g packet Take 17 g by mouth 2 (two) times daily. Titrate as needed (Patient taking differently: Take 17 g by mouth 2 (two) times daily as needed (constipation.).) 14 each 0    PROAIR   HFA 108 (90 BASE) MCG/ACT inhaler Inhale 2 puffs into the lungs every 6 (six) hours as needed for wheezing or shortness of breath.       scopolamine  (TRANSDERM-SCOP) 1 MG/3DAYS Place 1 patch onto the skin every 3 (three) days.      venlafaxine  XR (EFFEXOR -XR) 37.5 MG 24 hr capsule Take 37.5 mg by mouth daily with breakfast.       verapamil  (CALAN ) 80 MG tablet Take 160 mg by mouth 2 (two) times daily.      warfarin (COUMADIN ) 2 MG tablet TAKE 4MG  DAILY EXCEPT 6MG  ON TUESDAYS, THURSDAYS,AND SATURDAYS AS DIRECTED BY ANTICOAGULATION CLINIC 225 tablet 1     Assessment: 68 y.o. F presents with UTI. Note some hematuria past 3 days. Pt on warfarin PTA for afib and h/o DVT. Admission INR pending. CBC ok on admission. Home dose: 6mg  on Tues and Thur and 4mg  all other days  Pt on Levaquin for complicated UTI which may potentiate effects of warfarin.  Goal of Therapy:  INR 2-3 Monitor platelets by anticoagulation protocol: Yes   Plan:  Stat INR  Vito Ralph, PharmD, BCPS Please see amion for complete clinical pharmacist phone list 02/21/2024,9:47 PM  ADDENDUM (2250) INR 2 (low end of therapeutic)  Plan: Warfarin 4mg   Daily INR  Vito Ralph, PharmD, BCPS Please see amion for complete clinical pharmacist phone list 02/21/2024 10:52 PM

## 2024-02-22 DIAGNOSIS — N390 Urinary tract infection, site not specified: Secondary | ICD-10-CM | POA: Diagnosis not present

## 2024-02-22 LAB — BASIC METABOLIC PANEL WITH GFR
Anion gap: 7 (ref 5–15)
BUN: 17 mg/dL (ref 8–23)
CO2: 29 mmol/L (ref 22–32)
Calcium: 8.9 mg/dL (ref 8.9–10.3)
Chloride: 100 mmol/L (ref 98–111)
Creatinine, Ser: 0.92 mg/dL (ref 0.44–1.00)
GFR, Estimated: >60 mL/min (ref >60)
Glucose, Bld: 98 mg/dL (ref 70–99)
Potassium: 4.1 mmol/L (ref 3.5–5.1)
Sodium: 136 mmol/L (ref 135–145)

## 2024-02-22 LAB — GLUCOSE, CAPILLARY
Glucose-Capillary: 137 mg/dL — ABNORMAL HIGH (ref 70–99)
Glucose-Capillary: 86 mg/dL (ref 70–99)
Glucose-Capillary: 97 mg/dL (ref 70–99)
Glucose-Capillary: 114 mg/dL — ABNORMAL HIGH (ref 70–99)

## 2024-02-22 LAB — PROTIME-INR
INR: 2 — ABNORMAL HIGH (ref 0.8–1.2)
Prothrombin Time: 23.3 s — ABNORMAL HIGH (ref 11.4–15.2)

## 2024-02-22 LAB — CBC
HCT: 34.8 % — ABNORMAL LOW (ref 36.0–46.0)
Hemoglobin: 11.8 g/dL — ABNORMAL LOW (ref 12.0–15.0)
MCH: 30.8 pg (ref 26.0–34.0)
MCHC: 33.9 g/dL (ref 30.0–36.0)
MCV: 90.9 fL (ref 80.0–100.0)
Platelets: 284 K/uL (ref 150–400)
RBC: 3.83 MIL/uL — ABNORMAL LOW (ref 3.87–5.11)
RDW: 12.5 % (ref 11.5–15.5)
WBC: 6.7 K/uL (ref 4.0–10.5)
nRBC: 0 % (ref 0.0–0.2)

## 2024-02-22 LAB — URINE CULTURE: Culture: 30000 — AB

## 2024-02-22 LAB — HIV ANTIBODY (ROUTINE TESTING W REFLEX): HIV Screen 4th Generation wRfx: NONREACTIVE

## 2024-02-22 MED ORDER — DIPHENHYDRAMINE HCL 50 MG/ML IJ SOLN
12.5000 mg | Freq: Four times a day (QID) | INTRAMUSCULAR | Status: DC | PRN
  Administered 2024-02-22: 12.5 mg via INTRAVENOUS
  Filled 2024-02-22 (×2): qty 1

## 2024-02-22 MED ORDER — WARFARIN SODIUM 6 MG PO TABS: 6.0000 mg | ORAL_TABLET | Freq: Once | ORAL | Status: DC

## 2024-02-22 MED ORDER — WARFARIN SODIUM 4 MG PO TABS: 4.0000 mg | ORAL_TABLET | Freq: Once | ORAL | Status: DC

## 2024-02-22 NOTE — Progress Notes (Addendum)
 PHARMACY - ANTICOAGULATION CONSULT NOTE  Pharmacy Consult for warfarin Indication: atrial fibrillation and h/o DVT  Allergies  Allergen Reactions   Aspirin Other (See Comments)    Makes asthma worse and makes stomach hurt   Banana Swelling and Cough    Inside of the mouth swells   Food Other (See Comments) and Cough    Nuts- mucus membranes inflamed/cough    Iodinated Contrast Media Anaphylaxis    Required epinephrine . Since then, has tolerated with premed. Other reaction(s): wheeze...requires premdication with prednisone  3 days in advance   Latex Hives and Cough    Wheezing/cough  Other reaction(s): hives/wheeze   Meperidine  Hcl Other (See Comments)    Projectile vomiting    Peanut-Containing Drug Products Swelling and Cough    Inside of the mouth swells   Penicillins Anaphylaxis    Has patient had a PCN reaction causing immediate rash, facial/tongue/throat swelling, SOB or lightheadedness with hypotension:Yes Has patient had a PCN reaction causing severe rash involving mucus membranes or skin necrosis:Yes Has patient had a PCN reaction that required hospitalization:Treated in ER w/epi & breathing treatments Has patient had a PCN reaction occurring within the last 10 years:No If all of the above answers are NO, then may proceed with Cephalosporin use.    Statins Other (See Comments)    Severe leg pain - has tried most of them   Sulfonamide Derivatives Anaphylaxis   Tape Hives and Dermatitis    No plastic tape!!   Alfuzosin Other (See Comments)    Headache, dizziness   Halothane     Listed under anesthesia adverse reactions pt does not know what reaction or if any at preop on 05/26/21.    Other Other (See Comments)    Vascepa- upset stomach   Sulfamethoxazole Hives    Patient Measurements:    Vital Signs: Temp: 98.2 F (36.8 C) (12/02 0543) Temp Source: Oral (12/02 0543) BP: 113/41 (12/02 0543) Pulse Rate: 62 (12/02 0543)  Labs: Recent Labs    02/21/24 1355  02/21/24 2222 02/22/24 0429  HGB 14.1  --  11.8*  HCT 41.7  --  34.8*  PLT 354  --  284  LABPROT  --  24.1* 23.3*  INR  --  2.0* 2.0*  CREATININE 0.61  --  0.92    CrCl cannot be calculated (Unknown ideal weight.).   Medical History: Past Medical History:  Diagnosis Date   Allergic rhinitis    Anginal pain    Arthritis    back (05/07/2016)   Asthma    Atrial flutter (HCC)    a. Remotely per notes.   Carpal tunnel syndrome, bilateral    Colon polyps    Diverticulitis    Dizziness    DVT (deep venous thrombosis) (HCC) 1980s x2 -    between birth of two sons. Saw hematology - was told she has a hypercoagulable disorder, should be on Coumadin  lifelong.   Dysrhythmia    atrial fib   Family history of adverse reaction to anesthesia    mother gets PONV   Gallbladder disease    GERD (gastroesophageal reflux disease)    GIB (gastrointestinal bleeding) 04/14/2018   Halothane adverse reaction    narrow small opening   Heart murmur    Hiatal hernia    History of hiatal hernia    small one/CTA 05/01/2016 (05/07/2016)   History of kidney stones    HTN (hypertension)    Hypercholesteremia    hx; brought it down w/diet (05/07/2016)  Hyperglycemia    a. A1c 6.1 in 2014.   Inguinal hernia    Iron  deficiency anemia due to chronic blood loss 04/14/2018   Iron  malabsorption 04/14/2018   Left sided sciatica    Normal cardiac stress test 06/2012   Obesity    OSA (obstructive sleep apnea)    a. Mild, did not tolerate CPAP (05/07/2016)   PAF (paroxysmal atrial fibrillation) (HCC)    a. s/p afib ablation at [facility] 2010 (Dr. Arlon). b. Recurrent AF s/p DCCV 06/2010. c. On flecainide .    Paroxysmal atrial fibrillation (HCC) 09/09/2009   Qualifier: Diagnosis of  By: Lawernce CMA, Jewel     PONV (postoperative nausea and vomiting)    PPD positive, treated 1977   treated for 1 yr after exposure to patient   Pre-diabetes    RSV infection ~ 2006   Vertigo    Wandering  (atrial) pacemaker    a. Remotely per notes.  (  pt. states has a wandering p wave ) no pacemaker    Medications:  Medications Prior to Admission  Medication Sig Dispense Refill Last Dose/Taking   acetaminophen  (TYLENOL ) 650 MG CR tablet Take 1,300 mg by mouth every 8 (eight) hours as needed for pain.       APPLE CIDER VINEGAR PO Take 450 mg by mouth in the morning.      cetirizine (ZYRTEC) 10 MG tablet Take 10 mg by mouth in the morning.      Cholecalciferol  (VITAMIN D3) 50 MCG (2000 UT) capsule Take 2,000 Units by mouth in the morning.      colchicine  0.6 MG tablet Take 1 tablet (0.6 mg total) by mouth 2 (two) times daily for 5 days. 10 tablet 0    Continuous Blood Gluc Sensor (FREESTYLE LIBRE 2 SENSOR) MISC APPLY ONE SENSOR TO THE BACK OF THE UPPER ARM EVERY 14 DAYS      dicyclomine  (BENTYL ) 10 MG capsule TAKE 1 CAPSULE BY MOUTH 30 MINS BEFORE MEALS AND AT BEDTIME AS NEEDED FOR PAIN/CRAMPING 360 capsule 1    enoxaparin  (LOVENOX ) 100 MG/ML injection Inject 1 mL (100 mg total) into the skin every 12 (twelve) hours. 17 mL 0    ezetimibe  (ZETIA ) 10 MG tablet TAKE 1 TABLET BY MOUTH EVERY DAY 90 tablet 3    FLOVENT  HFA 220 MCG/ACT inhaler Inhale 1 puff into the lungs 2 (two) times daily.  1    fluticasone  (FLONASE ) 50 MCG/ACT nasal spray Place 1 spray into both nostrils 2 (two) times daily.       isosorbide  mononitrate (IMDUR ) 30 MG 24 hr tablet Take 30 mg by mouth daily.      losartan  (COZAAR ) 25 MG tablet Take 1 tablet (25 mg total) by mouth daily. 90 tablet 0    meclizine  (ANTIVERT ) 25 MG tablet Take 1 tablet (25 mg total) by mouth 3 (three) times daily as needed for dizziness. 30 tablet 0    Multiple Vitamin (MULTIVITAMIN WITH MINERALS) TABS tablet Take 1 tablet by mouth in the morning.      nitroGLYCERIN  (NITROSTAT ) 0.4 MG SL tablet PLACE 1 TABLET UNDER THE TONGUE EVERY 5 MINUTES AS NEEDED. 25 tablet 11    omeprazole  (PRILOSEC) 40 MG capsule TAKE 1 CAPSULE BY MOUTH DAILY, CAN INCREASE TO 2  TIMES DAILY IF NEEDED 180 capsule 0    OZEMPIC, 1 MG/DOSE, 4 MG/3ML SOPN Inject 1 mg into the skin once a week. Tuesdays      polyethylene glycol (MIRALAX ) 17 g packet  Take 17 g by mouth 2 (two) times daily. Titrate as needed (Patient taking differently: Take 17 g by mouth 2 (two) times daily as needed (constipation.).) 14 each 0    PROAIR  HFA 108 (90 BASE) MCG/ACT inhaler Inhale 2 puffs into the lungs every 6 (six) hours as needed for wheezing or shortness of breath.       scopolamine  (TRANSDERM-SCOP) 1 MG/3DAYS Place 1 patch onto the skin every 3 (three) days.      venlafaxine  XR (EFFEXOR -XR) 37.5 MG 24 hr capsule Take 37.5 mg by mouth daily with breakfast.       verapamil  (CALAN ) 80 MG tablet Take 160 mg by mouth 2 (two) times daily.      warfarin (COUMADIN ) 2 MG tablet TAKE 4MG  DAILY EXCEPT 6MG  ON TUESDAYS, THURSDAYS,AND SATURDAYS AS DIRECTED BY ANTICOAGULATION CLINIC 225 tablet 1     Assessment: 68 y.o. F presents with UTI. Note some hematuria past 3 days. Pt on warfarin PTA for afib and h/o DVT. Admission INR pending. CBC ok on admission. Home dose: 6mg  on Tues and Thur and 4mg  all other days  Pt on Levaquin  for complicated UTI which may potentiate effects of warfarin.  Goal of Therapy:  INR 2-3 Monitor platelets by anticoagulation protocol: Yes    Plan: Warfarin 4 mg po today Daily CBC / INR   Annaleese Guier BS, PharmD, BCPS Clinical Pharmacist 02/22/2024 7:17 AM  Contact: [phone] after 3 PM

## 2024-02-22 NOTE — Plan of Care (Incomplete)
 Had a seemingly uneventful day. This patient is aware of necessity for continued observation and is aware of care plan and educational needs at this time.   - HA -N/V -Lightheadedness/Dizziness -GI s/s +GU s/s: Dysuria, Hematuria, Polyuria, R Flank Pain  -N/T -Falls/LOC -Cough/Congestion  + Pruritis   Patient remains alert and oriented to: person, place, time, situation  Provider listed on chart contacted on 1 occurrences during shift.

## 2024-02-22 NOTE — TOC Initial Note (Signed)
 Transition of Care [facility]) - Initial/Assessment Note    Patient Details  Name: [name] MRN: [id number] Date of Birth: Jul 25, 1955  [facility]) CM/SW Contact:    Lendia Dais, LCSWA Phone Number: 02/22/2024, 3:15 PM  Clinical Narrative: Pt is from home with spouse, indep, w/ ADL, and drives.  Pt has seen PCP Dr. Alm Rav in the last year, reports no DME, and takes meds as prescribed. Pt has a hx of HH 2 years ago.   Pt's source of income is SSI and pension. Pt states they are able to afford their basic needs and has no concerns.  Pt reports no MH/SU concerns.  No current TOC needs. Please place consult.                 Expected Discharge Plan: Home/Self Care     Patient Goals and CMS Choice Patient states their goals for this hospitalization and ongoing recovery are:: Continue wellness visits          Expected Discharge Plan and Services In-house Referral: Clinical Social Work     Living arrangements for the past 2 months: Single Family Home                                      Prior Living Arrangements/Services Living arrangements for the past 2 months: Single Family Home Lives with:: Spouse Patient language and need for interpreter reviewed:: No Do you feel safe going back to the place where you live?: Yes      Need for Family Participation in Patient Care: No (Comment) Care giver support system in place?: No (comment)   Criminal Activity/Legal Involvement Pertinent to Current Situation/Hospitalization: No - Comment as needed  Activities of Daily Living   ADL Screening (condition at time of admission) Independently performs ADLs?: Yes (appropriate for developmental age) Is the patient deaf or have difficulty hearing?: No Does the patient have difficulty seeing, even when wearing glasses/contacts?: No Does the patient have difficulty concentrating, remembering, or making decisions?: No  Permission Sought/Granted Permission  sought to share information with : Family Supports Permission granted to share information with : Yes, Verbal Permission Granted  Share Information with NAME: Marysue Fait     Permission granted to share info w Relationship: Spouse  Permission granted to share info w Contact Information: [phone]  Emotional Assessment Appearance:: Appears stated age Attitude/Demeanor/Rapport: Engaged Affect (typically observed): Appropriate, Pleasant Orientation: : Oriented to Self, Oriented to Place, Oriented to  Time, Oriented to Situation Alcohol / Substance Use: Not Applicable Psych Involvement: No (comment)  Admission diagnosis:  Nephrolithiasis [N20.0] Pyelonephritis [N12] Complicated UTI (urinary tract infection) [N39.0] Patient Active Problem List   Diagnosis Date Noted   Complicated UTI (urinary tract infection) 02/21/2024   Mood disorder 02/21/2024   Type 2 diabetes mellitus with hyperglycemia (HCC) 12/23/2023   Situational stress 12/23/2023   Paresthesia of hand, bilateral 12/23/2023   Osteoarthritis of lumbar spine 12/23/2023   Lumbosacral spondylosis without myelopathy 12/23/2023   Lower extremity edema 12/23/2023   Primary localized osteoarthritis of right knee 01/06/2022   S/P total knee arthroplasty, right 01/06/2022   Hypercoagulable state due to persistent atrial fibrillation (HCC) 05/27/2021   Seasonal allergies 05/12/2021   Laryngopharyngeal reflux (LPR) 04/03/2021   Vertigo 04/03/2021   Exertional chest pain 03/29/2021   Angina pectoris 03/29/2021   Allergic rhinitis 04/17/2020   Chronic sinusitis 04/17/2020   Edema  04/17/2020   Mixed hyperlipidemia 04/17/2020   Morbid obesity (HCC) 04/17/2020   Sciatica 04/17/2020   Skin sensation disturbance 04/17/2020   Thrombophilia 04/17/2020   Type 2 diabetes mellitus with other specified complication (HCC) 04/17/2020   Osteopenia 03/18/2020   Abdominal pain, left lower quadrant 10/27/2018   Loose stools 10/27/2018   Iron   deficiency anemia due to chronic blood loss 04/14/2018   Iron  malabsorption 04/14/2018   GIB (gastrointestinal bleeding) 04/14/2018   Chronic anticoagulation (warfarin for Afib) 08/06/2017   Gastric polyp    Fall 07/08/2017   Scalp laceration 07/08/2017   Hypokalemia 07/08/2017   Numbness of hand 06/07/2017   Recurrent diverticulitis s/p robotic sigmoid colectomy 08/06/2017 11/24/2016   Right hip pain 05/14/2015   Obesity (BMI 30-39.9) 01/10/2014   Sleep apnea 01/10/2014   Preoperative cardiovascular examination 04/26/2013   Essential hypertension 03/01/2013   History of DVT (deep vein thrombosis) 01/12/2013   Hyperglycemia 01/12/2013   Dizziness 07/09/2010   Carpal tunnel syndrome 09/10/2009   Asthma 09/10/2009   Temporomandibular joint disorder 09/10/2009   GERD 09/10/2009   Disorder of gallbladder 09/10/2009   Paroxysmal atrial fibrillation (HCC) 09/09/2009   PCP:  Seabron Lenis, MD Pharmacy:   [facility] [phone] - [place], [place] - [address] [place] [address] Phone: [phone] Fax: [phone]  [facility] [address] [place] [address] Phone: [phone] Fax: [phone]     Social Drivers of Health (SDOH) Social History: SDOH Screenings   Food Insecurity: No Food Insecurity (02/21/2024)  Housing: Low Risk  (02/21/2024)  Transportation Needs: No Transportation Needs (02/21/2024)  Utilities: Not At Risk (02/21/2024)  Social Connections: Socially Integrated (02/21/2024)  Tobacco Use: Low Risk  (02/21/2024)   SDOH Interventions:     Readmission Risk Interventions    01/07/2022   10:18 AM  Readmission Risk Prevention Plan  Post Dischage Appt Complete  Medication Screening Complete  Transportation Screening Complete

## 2024-02-22 NOTE — Progress Notes (Signed)
 PROGRESS NOTE    [name]  FMW:[id number] DOB: 09/27/1955 DOA: 02/21/2024 PCP: Seabron Lenis, MD   Brief Narrative:  [name] is a 68 y.o. female with medical history significant of prior nephrolithiasis, anemia, hyperlipidemia, type 2 diabetes who presented to [facility] for evaluation of left-sided flank pain and hematuria for last 3 days, however she denied dysuria when asked.  Case was discussed with urology, they did not recommend any intervention due to no obstruction and no hydronephrosis.  Patient was admitted to hospital service for UTI and hematuria.  Assessment & Plan:   Principal Problem:   Complicated UTI (urinary tract infection) Active Problems:   Paroxysmal atrial fibrillation (HCC)   Essential hypertension   Type 2 diabetes mellitus with hyperglycemia (HCC)   GERD   Chronic anticoagulation (warfarin for Afib)   Iron  deficiency anemia due to chronic blood loss   Allergic rhinitis   Mixed hyperlipidemia   Vertigo   History of DVT (deep vein thrombosis)   Mood disorder  Complicated UTI (urinary tract infection) with history of nephrolithiasis Patient presents with 3 to 4 days left-sided flank pain, hematuria and chills.  9 mm ureteropelvic junction stone seen on CT renal stone protocol in addition to 5 mm nonobstructing left renal stone and 7 mm nonobstructing right renal stone.  Patient has been started on Levaquin due to large allergy profile to antibiotics.  Cultures are negative so far. --EDP spoke with urology and no procedural intervention was recommended at this time given no frank hydronephrosis or signs of obstruction on imaging.     Type 2 diabetes mellitus with hyperglycemia (HCC) Last available A1c was in October 2023, 7.2%, repeat A1c pending.  On Ozempic outpatient.  Currently on SSI and blood sugar controlled.   Essential hypertension BP elevated on arrival 182/95 improved to 148/72, suspect exacerbated by pain. --Resumed losartan ,  verapamil , blood pressure controlled. --IV hydralazine  as needed   Paroxysmal atrial fibrillation (HCC) CHA2DS2-VASc score is 5. Heart rate controlled on admission. continue verapamil  but hold Coumadin  due to hematuria.  She received a dose yesterday.   Vertigo Continue home meclizine  as needed   Mixed hyperlipidemia Med history pending but appears patient not on statin   Allergic rhinitis Claritin  substituted for home Zyrtec Continue Flonase    Iron  deficiency anemia due to chronic blood loss Hemoglobin stable at 14.1 on admission --Monitor CBC   Chronic anticoagulation (warfarin for Afib) Continue warfarin per pharmacy   GERD Continue PPI (Confirm when med history complete)   Mood disorder -- Continue Effexor  (confirm when med history complete)   History of DVT (deep vein thrombosis) Holding Coumadin  for now due to hematuria.   DVT prophylaxis: Coumadin    Code Status: Full Code  Family Communication:  None present at bedside.  Plan of care discussed with patient in length and he/she verbalized understanding and agreed with it.  Status is: Observation The patient will require care spanning > 2 midnights and should be moved to inpatient because: Still with hematuria and pain   Estimated body mass index is 34.19 kg/m as calculated from the following:   Height as of 03/31/23: 5' 4 (1.626 m).   Weight as of 03/31/23: 90.4 kg.    Nutritional Assessment: There is no height or weight on file to calculate BMI.. Seen by dietician.  I agree with the assessment and plan as outlined below: Nutrition Status:        . Skin Assessment: I have examined the patient's skin and I agree  with the wound assessment as performed by the wound care RN as outlined below:    Consultants:  None  Procedures:  None  Antimicrobials:  Anti-infectives (From admission, onward)    Start     Dose/Rate Route Frequency Ordered Stop   02/22/24 1600  levofloxacin (LEVAQUIN) IVPB 500 mg         500 mg 100 mL/hr over 60 Minutes Intravenous Every 24 hours 02/21/24 1843     02/21/24 1515  levofloxacin (LEVAQUIN) IVPB 500 mg        500 mg 100 mL/hr over 60 Minutes Intravenous  Once 02/21/24 1504 02/21/24 1635         Subjective: Patient seen and examined, she says that she still has left flank pain, 6 out of 10.  Her hematuria is improving.  No new complaint.  No fever or chills.  She is having some itching, believes that she is likely having some allergy to Levaquin.  I did not see any rashes.  Provided as needed Benadryl .  Objective: Vitals:   02/21/24 2001 02/22/24 0543 02/22/24 0846 02/22/24 0848  BP: (!) 128/58 (!) 113/41 (!) 96/43 (!) 111/45  Pulse: 62 62 65 60  Resp:  18 18 18   Temp: 98.2 F (36.8 C) 98.2 F (36.8 C) 98.1 F (36.7 C)   TempSrc:  Oral    SpO2: 94% 94% 94% 95%    Intake/Output Summary (Last 24 hours) at 02/22/2024 1143 Last data filed at 02/21/2024 1635 Gross per 24 hour  Intake 1090.45 ml  Output --  Net 1090.45 ml   There were no vitals filed for this visit.  Examination:  General exam: Appears calm and comfortable  Respiratory system: Clear to auscultation. Respiratory effort normal. Cardiovascular system: S1 & S2 heard, RRR. No JVD, murmurs, rubs, gallops or clicks. No pedal edema. Gastrointestinal system: Abdomen is nondistended, soft and left CVA tenderness.  No organomegaly or masses felt. Normal bowel sounds heard. Central nervous system: Alert and oriented. No focal neurological deficits. Extremities: Symmetric 5 x 5 power. Skin: No rashes, lesions or ulcers Psychiatry: Judgement and insight appear normal. Mood & affect appropriate.    Data Reviewed: I have personally reviewed following labs and imaging studies  CBC: Recent Labs  Lab 02/21/24 1355 02/22/24 0429  WBC 7.6 6.7  NEUTROABS 3.4  --   HGB 14.1 11.8*  HCT 41.7 34.8*  MCV 89.7 90.9  PLT 354 284   Basic Metabolic Panel: Recent Labs  Lab 02/21/24 1355  02/22/24 0429  NA 139 136  K 4.0 4.1  CL 100 100  CO2 29 29  GLUCOSE 120* 98  BUN 11 17  CREATININE 0.61 0.92  CALCIUM  10.4* 8.9   GFR: CrCl cannot be calculated (Unknown ideal weight.). Liver Function Tests: Recent Labs  Lab 02/21/24 1355  AST 22  ALT 17  ALKPHOS 75  BILITOT 0.3  PROT 7.3  ALBUMIN 4.2   No results for input(s): LIPASE, AMYLASE in the last 168 hours. No results for input(s): AMMONIA in the last 168 hours. Coagulation Profile: Recent Labs  Lab 02/21/24 2222 02/22/24 0429  INR 2.0* 2.0*   Cardiac Enzymes: No results for input(s): CKTOTAL, CKMB, CKMBINDEX, TROPONINI in the last 168 hours. BNP (last 3 results) No results for input(s): PROBNP in the last 8760 hours. HbA1C: No results for input(s): HGBA1C in the last 72 hours. CBG: Recent Labs  Lab 02/21/24 2110 02/22/24 0747  GLUCAP 126* 137*   Lipid Profile: No results for input(s):  CHOL, HDL, LDLCALC, TRIG, CHOLHDL, LDLDIRECT in the last 72 hours. Thyroid  Function Tests: No results for input(s): TSH, T4TOTAL, FREET4, T3FREE, THYROIDAB in the last 72 hours. Anemia Panel: No results for input(s): VITAMINB12, FOLATE, FERRITIN, TIBC, IRON , RETICCTPCT in the last 72 hours. Sepsis Labs: Recent Labs  Lab 02/21/24 1524 02/21/24 1713  LATICACIDVEN 2.0* 0.7    Recent Results (from the past 240 hours)  Blood culture (routine x 2)     Status: None (Preliminary result)   Collection Time: 02/21/24  2:56 PM   Specimen: BLOOD LEFT FOREARM  Result Value Ref Range Status   Specimen Description   Final    BLOOD LEFT FOREARM Performed at [facility], [address], [place], [place] [address]    Special Requests   Final    BOTTLES DRAWN AEROBIC AND ANAEROBIC Blood Culture adequate volume Performed at [facility], [address], [place], [place] [address]    Culture   Final    NO GROWTH < 24 HOURS Performed at [facility], [address] [place], [place] [address]    Report Status PENDING  Incomplete  Blood culture (routine x 2)     Status: None (Preliminary result)   Collection Time: 02/21/24  3:24 PM   Specimen: BLOOD RIGHT HAND  Result Value Ref Range Status   Specimen Description   Final    BLOOD RIGHT HAND Performed at [facility], [address] [place], [place] [address]    Special Requests   Final    BOTTLES DRAWN AEROBIC AND ANAEROBIC Blood Culture adequate volume Performed at [facility], [address], [place], [place] [address]    Culture   Final    NO GROWTH < 24 HOURS Performed at [facility], [address] [place], [place] [address]    Report Status PENDING  Incomplete     Radiology Studies: CT Renal Stone Study Result Date: 02/21/2024 CLINICAL DATA:  Abdominal pain.  Left-sided flank pain. EXAM: CT ABDOMEN AND PELVIS WITHOUT CONTRAST TECHNIQUE: Multidetector CT imaging of the abdomen and pelvis was performed following the standard protocol without IV contrast. RADIATION DOSE REDUCTION: This exam was performed according to the departmental dose-optimization program which includes automated exposure control, adjustment of the mA and/or kV according to patient size and/or use of iterative reconstruction technique. COMPARISON:  11/21/2020. FINDINGS: Lower chest: No acute abnormality. Hepatobiliary: No suspicious focal hepatic lesion identified within the limits of an unenhanced exam. Status post cholecystectomy. No biliary dilatation. Pancreas: Unremarkable. No pancreatic ductal dilatation or surrounding inflammatory changes. Spleen: Normal in size without focal abnormality. Adrenals/Urinary Tract: There is a 9 x 8 mm calculus at the left ureteropelvic junction with mild dilation of the left renal pelvis. No frank hydronephrosis. Additional nonobstructive left renal calculi measuring up to 5 mm at the inferior pole. Nonobstructive right renal  calculi with the largest measuring up to 7 x 5 mm at the interpolar right kidney. Bladder is unremarkable. Stomach/Bowel: Small hiatal hernia again noted. Small bowel is grossly unremarkable. No obstruction or inflammatory changes. Sigmoid colonic anastomosis. Vascular/Lymphatic: Abdominal aorta is normal in caliber with scattered atherosclerotic calcification. No enlarged abdominal or pelvic lymph nodes. Reproductive: Uterus and bilateral adnexa are unremarkable. Other: No abdominopelvic ascites. No intraperitoneal free air. Redemonstrated focal nodularity within the subcutaneous fat of the ventral abdominal wall, likely reflecting injection sites. No loculated fluid collection. Musculoskeletal: No acute osseous abnormality. No suspicious osseous lesion. IMPRESSION: 1. 9  mm calculus at the left ureteropelvic junction with mild dilation of the left renal pelvis. No frank hydronephrosis. 2. Additional nonobstructive left renal calculi measuring up to 5 mm. 3. Nonobstructive right renal calculi measuring up to 7 mm. 4.  Aortic Atherosclerosis (ICD10-I70.0). Electronically Signed   By: Harrietta Sherry M.D.   On: 02/21/2024 13:10    Scheduled Meds:  cholecalciferol   2,000 Units Oral q AM   ezetimibe   10 mg Oral Daily   fluticasone   1 spray Each Nare BID   insulin aspart  0-9 Units Subcutaneous TID WC   isosorbide  mononitrate  30 mg Oral Daily   loratadine   10 mg Oral Daily   losartan   25 mg Oral Daily   multivitamin with minerals  1 tablet Oral q AM   pantoprazole   40 mg Oral Daily   venlafaxine  XR  37.5 mg Oral Q breakfast   verapamil   160 mg Oral BID   warfarin  4 mg Oral ONCE-1600   Warfarin - Pharmacist Dosing Inpatient   Does not apply q1600   Continuous Infusions:  levofloxacin (LEVAQUIN) IV       LOS: 1 day   Fredia Skeeter, MD [facility]  02/22/2024, 11:43 AM   *Please note that this is a verbal dictation therefore any spelling or grammatical errors are due to the Dragon  [employer] One system interpretation.  Please page via Amion and do not message via secure chat for urgent patient care matters. Secure chat can be used for non urgent patient care matters.  How to contact the [facility] Attending or Consulting provider 7A - 7P or covering provider during after hours 7P -7A, for this patient?  Check the care team in [facility] and look for a) attending/consulting [facility] provider listed and b) the [facility] team listed. Page or secure chat 7A-7P. Log into [email or web address] and use [place]'s universal password to access. If you do not have the password, please contact the hospital operator. Locate the [facility] provider you are looking for under [facility] and page to a number that you can be directly reached. If you still have difficulty reaching the provider, please page the [facility] (Director on Call) for the Hospitalists listed on amion for assistance.

## 2024-02-22 NOTE — Care Management Obs Status (Signed)
 MEDICARE OBSERVATION STATUS NOTIFICATION   Patient Details  Name: [name] MRN: [id number] Date of Birth: 07-05-1955   Medicare Observation Status Notification Given:  Yes  Obs notice signed and a copy was given.   Clorinda Wyble 02/22/2024, 9:24 AM

## 2024-02-23 ENCOUNTER — Other Ambulatory Visit (HOSPITAL_COMMUNITY): Payer: Self-pay

## 2024-02-23 ENCOUNTER — Encounter: Payer: Self-pay | Admitting: Family

## 2024-02-23 DIAGNOSIS — N390 Urinary tract infection, site not specified: Secondary | ICD-10-CM | POA: Diagnosis not present

## 2024-02-23 LAB — BASIC METABOLIC PANEL WITH GFR
Anion gap: 13 (ref 5–15)
BUN: 15 mg/dL (ref 8–23)
CO2: 27 mmol/L (ref 22–32)
Calcium: 9.3 mg/dL (ref 8.9–10.3)
Chloride: 99 mmol/L (ref 98–111)
Creatinine, Ser: 0.77 mg/dL (ref 0.44–1.00)
GFR, Estimated: >60 mL/min (ref >60)
Glucose, Bld: 125 mg/dL — ABNORMAL HIGH (ref 70–99)
Potassium: 4.1 mmol/L (ref 3.5–5.1)
Sodium: 139 mmol/L (ref 135–145)

## 2024-02-23 LAB — CBC WITH DIFFERENTIAL/PLATELET
Abs Immature Granulocytes: 0.01 K/uL (ref 0.00–0.07)
Basophils Absolute: 0.1 K/uL (ref 0.0–0.1)
Basophils Relative: 1 %
Eosinophils Absolute: 0.1 K/uL (ref 0.0–0.5)
Eosinophils Relative: 2 %
HCT: 35.8 % — ABNORMAL LOW (ref 36.0–46.0)
Hemoglobin: 12.3 g/dL (ref 12.0–15.0)
Immature Granulocytes: 0 %
Lymphocytes Relative: 40 %
Lymphs Abs: 2.9 K/uL (ref 0.7–4.0)
MCH: 31 pg (ref 26.0–34.0)
MCHC: 34.4 g/dL (ref 30.0–36.0)
MCV: 90.2 fL (ref 80.0–100.0)
Monocytes Absolute: 0.7 K/uL (ref 0.1–1.0)
Monocytes Relative: 10 %
Neutro Abs: 3.4 K/uL (ref 1.7–7.7)
Neutrophils Relative %: 47 %
Platelets: 301 K/uL (ref 150–400)
RBC: 3.97 MIL/uL (ref 3.87–5.11)
RDW: 12.3 % (ref 11.5–15.5)
WBC: 7.2 K/uL (ref 4.0–10.5)
nRBC: 0 % (ref 0.0–0.2)

## 2024-02-23 LAB — PROTIME-INR
INR: 1.6 — ABNORMAL HIGH (ref 0.8–1.2)
Prothrombin Time: 20.1 s — ABNORMAL HIGH (ref 11.4–15.2)

## 2024-02-23 LAB — GLUCOSE, CAPILLARY: Glucose-Capillary: 156 mg/dL — ABNORMAL HIGH (ref 70–99)

## 2024-02-23 LAB — HEMOGLOBIN A1C
Hgb A1c MFr Bld: 5.9 % — ABNORMAL HIGH (ref 4.8–5.6)
Mean Plasma Glucose: 123 mg/dL

## 2024-02-23 MED ORDER — TRAMADOL HCL 50 MG PO TABS
50.0000 mg | ORAL_TABLET | Freq: Four times a day (QID) | ORAL | 0 refills | Status: DC | PRN
  Filled 2024-02-23: qty 15, 4d supply, fill #0

## 2024-02-23 MED ORDER — POLYETHYLENE GLYCOL 3350 17 G PO PACK
17.0000 g | PACK | Freq: Every day | ORAL | Status: DC
  Administered 2024-02-23: 17 g via ORAL
  Filled 2024-02-23: qty 1

## 2024-02-23 MED ORDER — LEVOFLOXACIN 750 MG PO TABS
750.0000 mg | ORAL_TABLET | Freq: Every day | ORAL | 0 refills | Status: AC
  Filled 2024-02-23: qty 5, 5d supply, fill #0

## 2024-02-23 NOTE — Plan of Care (Signed)
   Problem: Education: Goal: Ability to describe self-care measures that may prevent or decrease complications (Diabetes Survival Skills Education) will improve Outcome: Progressing

## 2024-02-23 NOTE — Discharge Summary (Signed)
 Physician Discharge Summary  [name] [facility] FMW:[id number] DOB: December 08, 1955 DOA: 02/21/2024  PCP: Seabron Lenis, MD  Admit date: 02/21/2024 Discharge date: 02/23/2024    Admitted From: Home Disposition: Home  Recommendations for Outpatient Follow-up:  Follow up with PCP in 1-2 weeks Please obtain BMP/CBC in one week Follow-up with your urologist in 1 week Please follow up with your PCP on the following pending results: Unresulted Labs (From admission, onward)     Start     Ordered   02/23/24 0500  CBC  Daily,   R      02/22/24 0718   02/23/24 0500  Protime-INR  Daily,   R      02/22/24 0718              Home Health: None Equipment/Devices: None none  Discharge Condition: Stable CODE STATUS: Full code Diet recommendation:  Diet Order             Diet Heart Room service appropriate? Yes; Fluid consistency: Thin  Diet effective now                   Subjective: Seen and examined, she says her left flank pain is improving and her urine has been clear since yesterday afternoon and she feels comfortable going home today.  Brief/Interim Summary: [name] is a 68 y.o. female with medical history significant of prior nephrolithiasis, anemia, hyperlipidemia, type 2 diabetes who presented to [facility] for evaluation of left-sided flank pain and hematuria for last 3 days, however she denied dysuria when asked.  Case was discussed with urology, they did not recommend any intervention due to no obstruction and no hydronephrosis.  Patient was admitted to hospital service for UTI and hematuria.   Complicated UTI (urinary tract infection) with history of nephrolithiasis Patient presents with 3 to 4 days left-sided flank pain, hematuria and chills.  9 mm ureteropelvic junction stone seen on CT renal stone protocol in addition to 5 mm nonobstructing left renal stone and 7 mm nonobstructing right renal stone.  Patient has been started on Levaquin due to large allergy profile  to antibiotics.  Cultures are negative so far. --EDP spoke with urology and no procedural intervention was recommended at this time given no frank hydronephrosis or signs of obstruction on imaging.  Patient's blood cultures have remained negative, urine cultures growing only 30,000 colonies of group B streptococci.  Discharging on 5 days of Levaquin and few tramadols per patient's request.   Type 2 diabetes mellitus with hyperglycemia (HCC) Last available A1c was in October 2023, 7.2%, repeat A1c pending.  On Ozempic outpatient.    Essential hypertension BP elevated on arrival 182/95 improved to 148/72, suspect exacerbated by pain. --Resumed losartan , verapamil , blood pressure controlled.   Paroxysmal atrial fibrillation (HCC) CHA2DS2-VASc score is 5. Heart rate controlled on admission.  Held Coumadin  yesterday.  She does not want to hold it anymore, she plans to resume Coumadin .  Resume verapamil .   Vertigo Continue home meclizine  as needed   Mixed hyperlipidemia Med history pending but appears patient not on statin   Allergic rhinitis Claritin  substituted for home Zyrtec Continue Flonase    Iron  deficiency anemia due to chronic blood loss Hemoglobin stable at 14.1 on admission --Monitor CBC   Chronic anticoagulation (warfarin for Afib) Continue warfarin per pharmacy   GERD Continue PPI (Confirm when med history complete)   Mood disorder -- Continue Effexor  (confirm when med history complete)   History of DVT (deep vein thrombosis) On Coumadin   Discharge plan was discussed with patient and/or family member and they verbalized understanding and agreed with it.  Discharge Diagnoses:  Principal Problem:   Complicated UTI (urinary tract infection) Active Problems:   Paroxysmal atrial fibrillation (HCC)   Essential hypertension   Type 2 diabetes mellitus with hyperglycemia (HCC)   GERD   Chronic anticoagulation (warfarin for Afib)   Iron  deficiency anemia due to chronic  blood loss   Allergic rhinitis   Mixed hyperlipidemia   Vertigo   History of DVT (deep vein thrombosis)   Mood disorder    Discharge Instructions   Allergies as of 02/23/2024       Reactions   Aspirin Other (See Comments)   Makes asthma worse and makes stomach hurt   Banana Swelling, Cough   Inside of the mouth swells   Food Other (See Comments), Cough   Nuts- mucus membranes inflamed/cough   Iodinated Contrast Media Anaphylaxis   Required epinephrine . Since then, has tolerated with premed. Other reaction(s): wheeze...requires premdication with prednisone  3 days in advance   Latex Hives, Cough   Wheezing/cough Other reaction(s): hives/wheeze   Meperidine  Hcl Other (See Comments)   Projectile vomiting   Peanut-containing Drug Products Swelling, Cough   Inside of the mouth swells   Penicillins Anaphylaxis   Has patient had a PCN reaction causing immediate rash, facial/tongue/throat swelling, SOB or lightheadedness with hypotension:Yes Has patient had a PCN reaction causing severe rash involving mucus membranes or skin necrosis:Yes Has patient had a PCN reaction that required hospitalization:Treated in ER w/epi & breathing treatments Has patient had a PCN reaction occurring within the last 10 years:No If all of the above answers are NO, then may proceed with Cephalosporin use.   Statins Other (See Comments)   Severe leg pain - has tried most of them   Sulfonamide Derivatives Anaphylaxis   Tape Hives, Dermatitis   No plastic tape!!   Alfuzosin Other (See Comments)   Headache, dizziness   Halothane    Listed under anesthesia adverse reactions pt does not know what reaction or if any at preop on 05/26/21.    Hydrocodone  Itching   Other Other (See Comments)   Vascepa- upset stomach   Sulfamethoxazole Hives        Medication List     TAKE these medications    acetaminophen  650 MG CR tablet Commonly known as: TYLENOL  Take 1,300 mg by mouth in the morning and at  bedtime.   APPLE CIDER VINEGAR PO Take 450 mg by mouth in the morning.   cetirizine 10 MG tablet Commonly known as: ZYRTEC Take 10 mg by mouth in the morning.   dicyclomine  10 MG capsule Commonly known as: BENTYL  TAKE 1 CAPSULE BY MOUTH 30 MINS BEFORE MEALS AND AT BEDTIME AS NEEDED FOR PAIN/CRAMPING   enoxaparin  100 MG/ML injection Commonly known as: LOVENOX  Inject 1 mL (100 mg total) into the skin every 12 (twelve) hours.   ezetimibe  10 MG tablet Commonly known as: ZETIA  TAKE 1 TABLET BY MOUTH EVERY DAY   Flovent  HFA 220 MCG/ACT inhaler Generic drug: fluticasone  Inhale 1 puff into the lungs 2 (two) times daily.   fluticasone  50 MCG/ACT nasal spray Commonly known as: FLONASE  Place 1 spray into both nostrils 2 (two) times daily.   FreeStyle Libre 2 Sensor Misc APPLY ONE SENSOR TO THE BACK OF THE UPPER ARM EVERY 14 DAYS   isosorbide  mononitrate 30 MG 24 hr tablet Commonly known as: IMDUR  Take 30 mg by mouth every evening.  levofloxacin  750 MG tablet Commonly known as: Levaquin  Take 1 tablet (750 mg total) by mouth daily for 5 days.   losartan  25 MG tablet Commonly known as: COZAAR  Take 1 tablet (25 mg total) by mouth daily.   meclizine  25 MG tablet Commonly known as: ANTIVERT  Take 1 tablet (25 mg total) by mouth 3 (three) times daily as needed for dizziness.   multivitamin with minerals Tabs tablet Take 1 tablet by mouth in the morning.   nitroGLYCERIN  0.4 MG SL tablet Commonly known as: NITROSTAT  PLACE 1 TABLET UNDER THE TONGUE EVERY 5 MINUTES AS NEEDED.   omeprazole  40 MG capsule Commonly known as: PRILOSEC TAKE 1 CAPSULE BY MOUTH DAILY, CAN INCREASE TO 2 TIMES DAILY IF NEEDED What changed: See the new instructions.   Ozempic (1 MG/DOSE) 4 MG/3ML Sopn Generic drug: Semaglutide (1 MG/DOSE) Inject 1 mg into the skin every Monday.   polyethylene glycol 17 g packet Commonly known as: MiraLax  Take 17 g by mouth 2 (two) times daily. Titrate as needed What  changed:  when to take this reasons to take this additional instructions   ProAir  HFA 108 (90 Base) MCG/ACT inhaler Generic drug: albuterol  Inhale 2 puffs into the lungs every 6 (six) hours as needed for wheezing or shortness of breath.   scopolamine  1 MG/3DAYS Commonly known as: TRANSDERM-SCOP Place 1 patch onto the skin as needed.   traMADol  50 MG tablet Commonly known as: Ultram  Take 1 tablet (50 mg total) by mouth every 6 (six) hours as needed.   venlafaxine  XR 37.5 MG 24 hr capsule Commonly known as: EFFEXOR -XR Take 37.5 mg by mouth daily with breakfast.   verapamil  80 MG tablet Commonly known as: CALAN  Take 160 mg by mouth 2 (two) times daily.   Vitamin D3 50 MCG (2000 UT) capsule Take 2,000 Units by mouth in the morning.   warfarin 2 MG tablet Commonly known as: COUMADIN  Take as directed. If you are unsure how to take this medication, talk to your nurse or doctor. Original instructions: TAKE 4MG  DAILY EXCEPT 6MG  ON TUESDAYS, THURSDAYS,AND SATURDAYS AS DIRECTED BY ANTICOAGULATION CLINIC        Follow-up Information     Seabron Lenis, MD Follow up in 1 week(s).   Specialty: Family Medicine Contact information: [address] [place] [address] [phone]                Allergies  Allergen Reactions   Aspirin Other (See Comments)    Makes asthma worse and makes stomach hurt   Banana Swelling and Cough    Inside of the mouth swells   Food Other (See Comments) and Cough    Nuts- mucus membranes inflamed/cough    Iodinated Contrast Media Anaphylaxis    Required epinephrine . Since then, has tolerated with premed. Other reaction(s): wheeze...requires premdication with prednisone  3 days in advance   Latex Hives and Cough    Wheezing/cough  Other reaction(s): hives/wheeze   Meperidine  Hcl Other (See Comments)    Projectile vomiting    Peanut-Containing Drug Products Swelling and Cough    Inside of the mouth swells    Penicillins Anaphylaxis    Has patient had a PCN reaction causing immediate rash, facial/tongue/throat swelling, SOB or lightheadedness with hypotension:Yes Has patient had a PCN reaction causing severe rash involving mucus membranes or skin necrosis:Yes Has patient had a PCN reaction that required hospitalization:Treated in ER w/epi & breathing treatments Has patient had a PCN reaction occurring within the last 10  years:No If all of the above answers are NO, then may proceed with Cephalosporin use.    Statins Other (See Comments)    Severe leg pain - has tried most of them   Sulfonamide Derivatives Anaphylaxis   Tape Hives and Dermatitis    No plastic tape!!   Alfuzosin Other (See Comments)    Headache, dizziness   Halothane     Listed under anesthesia adverse reactions pt does not know what reaction or if any at preop on 05/26/21.    Hydrocodone  Itching   Other Other (See Comments)    Vascepa- upset stomach   Sulfamethoxazole Hives    Consultations: Urology   Procedures/Studies: CT Renal Stone Study Result Date: 02/21/2024 CLINICAL DATA:  Abdominal pain.  Left-sided flank pain. EXAM: CT ABDOMEN AND PELVIS WITHOUT CONTRAST TECHNIQUE: Multidetector CT imaging of the abdomen and pelvis was performed following the standard protocol without IV contrast. RADIATION DOSE REDUCTION: This exam was performed according to the departmental dose-optimization program which includes automated exposure control, adjustment of the mA and/or kV according to patient size and/or use of iterative reconstruction technique. COMPARISON:  11/21/2020. FINDINGS: Lower chest: No acute abnormality. Hepatobiliary: No suspicious focal hepatic lesion identified within the limits of an unenhanced exam. Status post cholecystectomy. No biliary dilatation. Pancreas: Unremarkable. No pancreatic ductal dilatation or surrounding inflammatory changes. Spleen: Normal in size without focal abnormality. Adrenals/Urinary Tract:  There is a 9 x 8 mm calculus at the left ureteropelvic junction with mild dilation of the left renal pelvis. No frank hydronephrosis. Additional nonobstructive left renal calculi measuring up to 5 mm at the inferior pole. Nonobstructive right renal calculi with the largest measuring up to 7 x 5 mm at the interpolar right kidney. Bladder is unremarkable. Stomach/Bowel: Small hiatal hernia again noted. Small bowel is grossly unremarkable. No obstruction or inflammatory changes. Sigmoid colonic anastomosis. Vascular/Lymphatic: Abdominal aorta is normal in caliber with scattered atherosclerotic calcification. No enlarged abdominal or pelvic lymph nodes. Reproductive: Uterus and bilateral adnexa are unremarkable. Other: No abdominopelvic ascites. No intraperitoneal free air. Redemonstrated focal nodularity within the subcutaneous fat of the ventral abdominal wall, likely reflecting injection sites. No loculated fluid collection. Musculoskeletal: No acute osseous abnormality. No suspicious osseous lesion. IMPRESSION: 1. 9 mm calculus at the left ureteropelvic junction with mild dilation of the left renal pelvis. No frank hydronephrosis. 2. Additional nonobstructive left renal calculi measuring up to 5 mm. 3. Nonobstructive right renal calculi measuring up to 7 mm. 4.  Aortic Atherosclerosis (ICD10-I70.0). Electronically Signed   By: Harrietta Sherry M.D.   On: 02/21/2024 13:10     Discharge Exam: Vitals:   02/23/24 0538 02/23/24 0821  BP: (!) 150/75 (!) 149/66  Pulse: 77 71  Resp: 18 19  Temp: 98.6 F (37 C) 98.4 F (36.9 C)  SpO2: 92% 97%   Vitals:   02/22/24 1645 02/22/24 2016 02/23/24 0538 02/23/24 0821  BP: 114/61 (!) 130/51 (!) 150/75 (!) 149/66  Pulse: 66 68 77 71  Resp: 18 18 18 19   Temp: 97.8 F (36.6 C) 99.5 F (37.5 C) 98.6 F (37 C) 98.4 F (36.9 C)  TempSrc: Oral Oral Oral Oral  SpO2: 94% 95% 92% 97%  Weight:      Height:        General: Pt is alert, awake, not in acute  distress Cardiovascular: RRR, S1/S2 +, no rubs, no gallops Respiratory: CTA bilaterally, no wheezing, no rhonchi Abdominal: Soft, very mild left CVA tenderness, ND, bowel sounds + Extremities: no  edema, no cyanosis    The results of significant diagnostics from this hospitalization (including imaging, microbiology, ancillary and laboratory) are listed below for reference.     Microbiology: Recent Results (from the past 240 hours)  Urine Culture     Status: Abnormal   Collection Time: 02/21/24 11:25 AM   Specimen: Urine, Clean Catch  Result Value Ref Range Status   Specimen Description   Final    URINE, CLEAN CATCH Performed at [facility], [address], [place], [place] [address]    Special Requests   Final    NONE Performed at [facility], [address], [place], [place] [address]    Culture (A)  Final    30,000 COLONIES/mL GROUP B STREP(S.AGALACTIAE)ISOLATED TESTING AGAINST S. AGALACTIAE NOT ROUTINELY PERFORMED DUE TO PREDICTABILITY OF AMP/PEN/VAN SUSCEPTIBILITY. Performed at [facility], [address] [place], [place] [address]    Report Status 02/22/2024 FINAL  Final  Blood culture (routine x 2)     Status: None (Preliminary result)   Collection Time: 02/21/24  2:56 PM   Specimen: BLOOD LEFT FOREARM  Result Value Ref Range Status   Specimen Description   Final    BLOOD LEFT FOREARM Performed at [facility], [address] [place], [place] [address]    Special Requests   Final    BOTTLES DRAWN AEROBIC AND ANAEROBIC Blood Culture adequate volume Performed at [facility], [address] [place], [place] [address]    Culture   Final    NO GROWTH 2 DAYS Performed at [facility], [address], [place], [place] [address]    Report Status PENDING  Incomplete  Blood culture (routine x 2)     Status: None (Preliminary result)   Collection Time: 02/21/24  3:24 PM   Specimen: BLOOD  RIGHT HAND  Result Value Ref Range Status   Specimen Description   Final    BLOOD RIGHT HAND Performed at [facility], [address] [place], [place] [address]    Special Requests   Final    BOTTLES DRAWN AEROBIC AND ANAEROBIC Blood Culture adequate volume Performed at [facility], [address], [place], [place] [address]    Culture   Final    NO GROWTH 2 DAYS Performed at [facility], [address], [place], [place] [address]    Report Status PENDING  Incomplete     Labs: BNP (last 3 results) No results for input(s): BNP in the last 8760 hours. Basic Metabolic Panel: Recent Labs  Lab 02/21/24 1355 02/22/24 0429 02/23/24 0554  NA 139 136 139  K 4.0 4.1 4.1  CL 100 100 99  CO2 29 29 27   GLUCOSE 120* 98 125*  BUN 11 17 15   CREATININE 0.61 0.92 0.77  CALCIUM  10.4* 8.9 9.3   Liver Function Tests: Recent Labs  Lab 02/21/24 1355  AST 22  ALT 17  ALKPHOS 75  BILITOT 0.3  PROT 7.3  ALBUMIN 4.2   No results for input(s): LIPASE, AMYLASE in the last 168 hours. No results for input(s): AMMONIA in the last 168 hours. CBC: Recent Labs  Lab 02/21/24 1355 02/22/24 0429 02/23/24 0554  WBC 7.6 6.7 7.2  NEUTROABS 3.4  --  3.4  HGB 14.1 11.8* 12.3  HCT 41.7 34.8* 35.8*  MCV 89.7 90.9 90.2  PLT 354 284 301   Cardiac Enzymes: No results for input(s): CKTOTAL, CKMB, CKMBINDEX, TROPONINI in the  last 168 hours. BNP: Invalid input(s): POCBNP CBG: Recent Labs  Lab 02/22/24 0747 02/22/24 1148 02/22/24 1647 02/22/24 2017 02/23/24 0822  GLUCAP 137* 86 97 114* 156*   D-Dimer No results for input(s): DDIMER in the last 72 hours. Hgb A1c Recent Labs    02/21/24 2019  HGBA1C 5.9*   Lipid Profile No results for input(s): CHOL, HDL, LDLCALC, TRIG, CHOLHDL, LDLDIRECT in the last 72 hours. Thyroid  function studies No results for input(s): TSH, T4TOTAL, T3FREE, THYROIDAB in the last 72  hours.  Invalid input(s): FREET3 Anemia work up No results for input(s): VITAMINB12, FOLATE, FERRITIN, TIBC, IRON , RETICCTPCT in the last 72 hours. Urinalysis    Component Value Date/Time   COLORURINE ORANGE (A) 02/21/2024 1125   APPEARANCEUR HAZY (A) 02/21/2024 1125   LABSPEC 1.019 02/21/2024 1125   PHURINE 6.0 02/21/2024 1125   GLUCOSEU NEGATIVE 02/21/2024 1125   HGBUR LARGE (A) 02/21/2024 1125   BILIRUBINUR NEGATIVE 02/21/2024 1125   KETONESUR NEGATIVE 02/21/2024 1125   PROTEINUR 100 (A) 02/21/2024 1125   UROBILINOGEN 1.0 11/29/2014 1300   NITRITE NEGATIVE 02/21/2024 1125   LEUKOCYTESUR SMALL (A) 02/21/2024 1125   Sepsis Labs Recent Labs  Lab 02/21/24 1355 02/22/24 0429 02/23/24 0554  WBC 7.6 6.7 7.2   Microbiology Recent Results (from the past 240 hours)  Urine Culture     Status: Abnormal   Collection Time: 02/21/24 11:25 AM   Specimen: Urine, Clean Catch  Result Value Ref Range Status   Specimen Description   Final    URINE, CLEAN CATCH Performed at [facility], [address], [place], [place] [address]    Special Requests   Final    NONE Performed at [facility], [address] [place], [place] [address]    Culture (A)  Final    30,000 COLONIES/mL GROUP B STREP(S.AGALACTIAE)ISOLATED TESTING AGAINST S. AGALACTIAE NOT ROUTINELY PERFORMED DUE TO PREDICTABILITY OF AMP/PEN/VAN SUSCEPTIBILITY. Performed at [facility], [address], [place], [place] [address]    Report Status 02/22/2024 FINAL  Final  Blood culture (routine x 2)     Status: None (Preliminary result)   Collection Time: 02/21/24  2:56 PM   Specimen: BLOOD LEFT FOREARM  Result Value Ref Range Status   Specimen Description   Final    BLOOD LEFT FOREARM Performed at [facility], [address] [place], [place] [address]    Special Requests   Final    BOTTLES DRAWN AEROBIC AND ANAEROBIC Blood Culture adequate  volume Performed at [facility], [address] [place], [place] [address]    Culture   Final    NO GROWTH 2 DAYS Performed at [facility], [address], [place], [place] [address]    Report Status PENDING  Incomplete  Blood culture (routine x 2)     Status: None (Preliminary result)   Collection Time: 02/21/24  3:24 PM   Specimen: BLOOD RIGHT HAND  Result Value Ref Range Status   Specimen Description   Final    BLOOD RIGHT HAND Performed at [facility], [address] [place], [place] [address]    Special Requests   Final    BOTTLES DRAWN AEROBIC AND ANAEROBIC Blood Culture adequate volume Performed at [facility], [address] [place], [place] [address]    Culture   Final    NO GROWTH 2 DAYS Performed at [facility], [address], [place], [place] [address]  Report Status PENDING  Incomplete    FURTHER DISCHARGE INSTRUCTIONS:   Get Medicines reviewed and adjusted: Please take all your medications with you for your next visit with your Primary MD   Laboratory/radiological data: Please request your Primary MD to go over all hospital tests and procedure/radiological results at the follow up, please ask your Primary MD to get all Hospital records sent to his/her office.   In some cases, they will be blood work, cultures and biopsy results pending at the time of your discharge. Please request that your primary care M.D. goes through all the records of your hospital data and follows up on these results.   Also Note the following: If you experience worsening of your admission symptoms, develop shortness of breath, life threatening emergency, suicidal or homicidal thoughts you must seek medical attention immediately by calling 911 or calling your MD immediately  if symptoms less severe.   You must read complete instructions/literature along with all the possible adverse reactions/side effects for all the  Medicines you take and that have been prescribed to you. Take any new Medicines after you have completely understood and accpet all the possible adverse reactions/side effects.    patient was instructed, not to drive, operate heavy machinery, perform activities at heights, swimming or participation in water  activities or provide baby-sitting services while on Pain, Sleep and Anxiety Medications; until their outpatient Physician has advised to do so again. Also recommended to not to take more than prescribed Pain, Sleep and Anxiety Medications.  It is not advisable to combine anxiety, sleep and pain medications without talking with your primary care provider.     Wear Seat belts while driving.   Please note: You were cared for by a hospitalist during your hospital stay. Once you are discharged, your primary care physician will handle any further medical issues. Please note that NO REFILLS for any discharge medications will be authorized once you are discharged, as it is imperative that you return to your primary care physician (or establish a relationship with a primary care physician if you do not have one) for your post hospital discharge needs so that they can reassess your need for medications and monitor your lab values  Time coordinating discharge: Over 30 minutes  SIGNED:   Fredia Skeeter, MD  [facility] 02/23/2024, 10:18 AM *Please note that this is a verbal dictation therefore any spelling or grammatical errors are due to the Dragon [employer] One system interpretation. If 7PM-7AM, please contact night-coverage [email or web address]

## 2024-02-23 NOTE — TOC Transition Note (Addendum)
 Transition of Care [facility]) - Discharge Note   Patient Details  Name: [name] MRN: [id number] Date of Birth: 09-02-1955  Transition of Care [facility]) CM/SW Contact:  Tom-Johnson, Harvest Muskrat, RN Phone Number: 02/23/2024, 10:04 AM   Clinical Narrative:     Patient is scheduled for discharge today.  Readmission Risk Assessment done. Outpatient f/u, hospital f/u and discharge instructions on AVS. Prescriptions sent to [facility] pharmacy and patient will receive meds prior discharge. INR check appointment scheduled and info on AVS. Husband, Arley to transport at discharge.  No further ICM needs noted.       Final next level of care: Home/Self Care Barriers to Discharge: Barriers Resolved   Patient Goals and CMS Choice Patient states their goals for this hospitalization and ongoing recovery are:: Continue wellness visits          Discharge Placement                Patient to be transferred to facility by: Husband Name of family member notified: Arley    Discharge Plan and Services Additional resources added to the After Visit Summary for   In-house Referral: Clinical Social Work              DME Arranged: N/A         HH Arranged: NA HH Agency: NA        Social Drivers of Health (SDOH) Interventions SDOH Screenings   Food Insecurity: No Food Insecurity (02/21/2024)  Housing: Low Risk  (02/21/2024)  Transportation Needs: No Transportation Needs (02/21/2024)  Utilities: Not At Risk (02/21/2024)  Social Connections: Socially Integrated (02/21/2024)  Tobacco Use: Low Risk  (02/21/2024)     Readmission Risk Interventions    02/23/2024   10:03 AM 01/07/2022   10:18 AM  Readmission Risk Prevention Plan  Post Dischage Appt Complete Complete  Medication Screening Complete Complete  Transportation Screening Complete Complete

## 2024-02-23 NOTE — Progress Notes (Addendum)
 Discharge   Patient expressed verbal understanding of discharge POC.   Patient given time to ask any questions.  Additional education included in AVS.  Alert oriented in good spirits.  2 PIV removed.  Pressure dressings intact. Discharging to Main Entrance A   Toc meds ready Volunteer called.

## 2024-02-25 ENCOUNTER — Observation Stay

## 2024-02-25 DIAGNOSIS — D6869 Other thrombophilia: Secondary | ICD-10-CM

## 2024-02-25 DIAGNOSIS — Z5181 Encounter for therapeutic drug level monitoring: Secondary | ICD-10-CM | POA: Diagnosis not present

## 2024-02-25 DIAGNOSIS — Z86718 Personal history of other venous thrombosis and embolism: Secondary | ICD-10-CM

## 2024-02-25 DIAGNOSIS — I480 Paroxysmal atrial fibrillation: Secondary | ICD-10-CM | POA: Diagnosis not present

## 2024-02-25 DIAGNOSIS — I4819 Other persistent atrial fibrillation: Secondary | ICD-10-CM

## 2024-02-25 LAB — POCT INR: INR: 2.3 (ref 2.0–3.0)

## 2024-02-25 NOTE — Patient Instructions (Addendum)
 Description   INR-2.3; Since taking levaquin  750mg  and will start cipro , take warfarin 2mg  tomorrow and 2mg  Sunday then have INR checked on Monday.  Normal dose: warfarin 2 tablets daily, except 3 tablets every Tuesday, Thursday and Saturday. Recheck INR on Monday. Call Coumadin  Clinic [phone] Procedure Fax # [phone]

## 2024-02-25 NOTE — Progress Notes (Signed)
 Description   INR-2.3; Since taking levaquin  750mg  and will start cipro , take warfarin 2mg  tomorrow and 2mg  Sunday then have INR checked on Monday.  Normal dose: warfarin 2 tablets daily, except 3 tablets every Tuesday, Thursday and Saturday. Recheck INR on Monday. Call Coumadin  Clinic [phone] Procedure Fax # [phone]

## 2024-02-26 LAB — CULTURE, BLOOD (ROUTINE X 2)
Culture: NO GROWTH
Culture: NO GROWTH
Special Requests: ADEQUATE
Special Requests: ADEQUATE

## 2024-02-28 ENCOUNTER — Ambulatory Visit: Attending: Cardiology

## 2024-02-28 DIAGNOSIS — I4819 Other persistent atrial fibrillation: Secondary | ICD-10-CM

## 2024-02-28 DIAGNOSIS — D6869 Other thrombophilia: Secondary | ICD-10-CM

## 2024-02-28 DIAGNOSIS — Z86718 Personal history of other venous thrombosis and embolism: Secondary | ICD-10-CM

## 2024-02-28 DIAGNOSIS — I480 Paroxysmal atrial fibrillation: Secondary | ICD-10-CM

## 2024-02-28 LAB — POCT INR: INR: 1.6 — AB (ref 2.0–3.0)

## 2024-02-28 NOTE — Progress Notes (Signed)
 Description   INR-1.6; Since taking cipro  250mg  daily (started 02/28/24) take warfarin 6mg  today then resume taking warfarin 2 tablets daily, except 3 tablets every Tuesday, Thursday and Saturday. Recheck INR in 1 week.  Call Coumadin  Clinic [phone] Procedure Fax # [phone]

## 2024-02-28 NOTE — Patient Instructions (Signed)
 Description   INR-1.6; Since taking cipro  250mg  daily (started 02/28/24) take warfarin 6mg  today then resume taking warfarin 2 tablets daily, except 3 tablets every Tuesday, Thursday and Saturday. Recheck INR in 1 week.  Call Coumadin  Clinic [phone] Procedure Fax # [phone]

## 2024-03-01 ENCOUNTER — Telehealth: Payer: Self-pay | Admitting: Physician Assistant

## 2024-03-01 ENCOUNTER — Other Ambulatory Visit: Payer: Self-pay | Admitting: Urology

## 2024-03-01 NOTE — Telephone Encounter (Signed)
° °  Pre-operative Risk Assessment    Patient Name: [name]  DOB: 27-Oct-1955 MRN: [id number]   Date of last office visit: 03/31/23  Date of next office visit: n/a    Request for Surgical Clearance    Procedure:  cystoscopy with left ureteroscopy; laser lithotripsy and stent placement   Date of Surgery:  Clearance 03/14/24                                Surgeon:  Dr. Elisabeth Socks Group or Practice Name:  [facility]  Phone number:  [phone]  Fax number:  [phone]    Type of Clearance Requested:   - Medical  - Pharmacy:  Hold Warfarin (Coumadin ) 5 days prior    Type of Anesthesia:  General    Additional requests/questions:    Bonney Sheffield JONELLE Lenora   03/01/2024, 2:48 PM

## 2024-03-03 ENCOUNTER — Telehealth: Payer: Self-pay

## 2024-03-03 ENCOUNTER — Ambulatory Visit: Attending: Cardiology

## 2024-03-03 NOTE — Telephone Encounter (Signed)
° °  Name: [name]  DOB: 1955/08/07  MRN: [id number]  Primary Cardiologist: Shelda Bruckner, MD   Preoperative team, please contact this patient and set up a phone call appointment for further preoperative risk assessment. Please obtain consent and complete medication review. Thank you for your help.  I confirm that guidance regarding antiplatelet and oral anticoagulation therapy has been completed and, if necessary, noted below.  Per Pharmacy Per office protocol, patient can hold warfarin for 5 days prior to procedure.     Patient WILL need bridging with Lovenox  (enoxaparin ) around procedure.  I also confirmed the patient resides in the state of [place] . As per [facility] Medical Board telemedicine laws, the patient must reside in the state in which the provider is licensed.   Lamarr Satterfield, NP 03/03/2024, 7:47 AM [facility]

## 2024-03-03 NOTE — Telephone Encounter (Signed)
°  Patient Consent for Virtual Visit        [name] has provided verbal consent on 03/03/2024 for a virtual visit (video or telephone).   CONSENT FOR VIRTUAL VISIT FOR:  [name]  By participating in this virtual visit I agree to the following:  I hereby voluntarily request, consent and authorize [facility] and its employed or contracted physicians, physician assistants, nurse practitioners or other licensed health care professionals (the Practitioner), to provide me with telemedicine health care services (the Services) as deemed necessary by the treating Practitioner. I acknowledge and consent to receive the Services by the Practitioner via telemedicine. I understand that the telemedicine visit will involve communicating with the Practitioner through live audiovisual communication technology and the disclosure of certain medical information by electronic transmission. I acknowledge that I have been given the opportunity to request an in-person assessment or other available alternative prior to the telemedicine visit and am voluntarily participating in the telemedicine visit.  I understand that I have the right to withhold or withdraw my consent to the use of telemedicine in the course of my care at any time, without affecting my right to future care or treatment, and that the Practitioner or I may terminate the telemedicine visit at any time. I understand that I have the right to inspect all information obtained and/or recorded in the course of the telemedicine visit and may receive copies of available information for a reasonable fee.  I understand that some of the potential risks of receiving the Services via telemedicine include:  Delay or interruption in medical evaluation due to technological equipment failure or disruption; Information transmitted may not be sufficient (e.g. poor resolution of images) to allow for appropriate medical decision making by the  Practitioner; and/or  In rare instances, security protocols could fail, causing a breach of personal health information.  Furthermore, I acknowledge that it is my responsibility to provide information about my medical history, conditions and care that is complete and accurate to the best of my ability. I acknowledge that Practitioner's advice, recommendations, and/or decision may be based on factors not within their control, such as incomplete or inaccurate data provided by me or distortions of diagnostic images or specimens that may result from electronic transmissions. I understand that the practice of medicine is not an exact science and that Practitioner makes no warranties or guarantees regarding treatment outcomes. I acknowledge that a copy of this consent can be made available to me via my patient portal [facility] MyChart), or I can request a printed copy by calling the office of [facility].    I understand that my insurance will be billed for this visit.   I have read or had this consent read to me. I understand the contents of this consent, which adequately explains the benefits and risks of the Services being provided via telemedicine.  I have been provided ample opportunity to ask questions regarding this consent and the Services and have had my questions answered to my satisfaction. I give my informed consent for the services to be provided through the use of telemedicine in my medical care

## 2024-03-03 NOTE — Telephone Encounter (Signed)
 Patient with diagnosis of afib and recurrent DVT on warfarin for anticoagulation.    Procedure: cystoscopy with left ureteroscopy; laser lithotripsy and stent placement  Date of procedure: 03/14/24   CHA2DS2-VASc Score = 5   This indicates a 7.2% annual risk of stroke. The patient's score is based upon: CHF History: 0 HTN History: 1 Diabetes History: 1 Stroke History: 0 Vascular Disease History: 1 Age Score: 1 Gender Score: 1      CrCl 75 ml/min Platelet count 301  Patient has not had an Afib/aflutter ablation in the last 3 months, DCCV within the last 4 weeks or a watchman implanted in the last 45 days    Per office protocol, patient can hold warfarin for 5 days prior to procedure.    Patient WILL need bridging with Lovenox  (enoxaparin ) around procedure.  Patient already scheduled in coumadin  clinic 12/16 at which time a bridge can be coordinated.  **This guidance is not considered finalized until pre-operative APP has relayed final recommendations.**

## 2024-03-03 NOTE — Telephone Encounter (Signed)
 Pt scheduled for VV on 12/12

## 2024-03-03 NOTE — Progress Notes (Signed)
 Virtual Visit via Telephone Note   Because of [name] co-morbid illnesses, she is at least at moderate risk for complications without adequate follow up.  This format is felt to be most appropriate for this patient at this time.  Due to technical limitations with video connection (technology), today's appointment will be conducted as an audio only telehealth visit, and [name] verbally agreed to proceed in this manner.   All issues noted in this document were discussed and addressed.  No physical exam could be performed with this format.  Evaluation Performed:  Preoperative cardiovascular risk assessment _____________   Date:  03/03/2024   Patient ID:  [name], DOB 21-Feb-1956, MRN [id number] Patient Location:  Home Provider location:   Office  Primary Care Provider:  Seabron Lenis, MD Primary Cardiologist:  Shelda Bruckner, MD  Chief Complaint / Patient Profile   68 y.o. y/o female with a h/o paroxysmal atrial fibrillation, hypertension, CAD, GERD, thrombophilia who is pending cystoscopy with left ureteroscopy; laser lithotripsy and stent placement on 01/13/2024 with [facility] and presents today for telephonic preoperative cardiovascular risk assessment.  History of Present Illness    [name] is a 68 y.o. female who presents via audio/video conferencing for a telehealth visit today.  Pt was last seen in cardiology clinic on 03/31/2023 by [place], PA.  At that time [name] was doing well.  The patient is now pending procedure as outlined above. Since her last visit, she denies chest pain, lower extremity edema, fatigue, palpitations, melena, hemoptysis, diaphoresis, weakness, presyncope, syncope, orthopnea, and PND.  Today patient is doing well without acute cardiovascular concerns or complaints.  She denies any symptoms concerning for recurrent atrial fibrillation.  She denies any palpitations or fast heart rates.  Furthermore she  denies any chest pains or shortness of breath.  She does stay very active and enjoys doing yard work which she denies any exertional symptoms or limitations.  Overall she has no symptoms to suggest active angina.  She can complete greater than 4 METS.  Past Medical History    Past Medical History:  Diagnosis Date   Allergic rhinitis    Anginal pain    Arthritis    back (05/07/2016)   Asthma    Atrial flutter (HCC)    a. Remotely per notes.   Carpal tunnel syndrome, bilateral    Colon polyps    Diverticulitis    Dizziness    DVT (deep venous thrombosis) (HCC) 1980s x2 -    between birth of two sons. Saw hematology - was told she has a hypercoagulable disorder, should be on Coumadin  lifelong.   Dysrhythmia    atrial fib   Family history of adverse reaction to anesthesia    mother gets PONV   Gallbladder disease    GERD (gastroesophageal reflux disease)    GIB (gastrointestinal bleeding) 04/14/2018   Halothane adverse reaction    narrow small opening   Heart murmur    Hiatal hernia    History of hiatal hernia    small one/CTA 05/01/2016 (05/07/2016)   History of kidney stones    HTN (hypertension)    Hypercholesteremia    hx; brought it down w/diet (05/07/2016)   Hyperglycemia    a. A1c 6.1 in 2014.   Inguinal hernia    Iron  deficiency anemia due to chronic blood loss 04/14/2018   Iron  malabsorption 04/14/2018   Left sided sciatica    Normal cardiac stress test 06/2012  Obesity    OSA (obstructive sleep apnea)    a. Mild, did not tolerate CPAP (05/07/2016)   PAF (paroxysmal atrial fibrillation) (HCC)    a. s/p afib ablation at [facility] 2010 (Dr. Arlon). b. Recurrent AF s/p DCCV 06/2010. c. On flecainide .    Paroxysmal atrial fibrillation (HCC) 09/09/2009   Qualifier: Diagnosis of  By: Lawernce CMA, Jewel     PONV (postoperative nausea and vomiting)    PPD positive, treated 1977   treated for 1 yr after exposure to patient   Pre-diabetes    RSV infection ~  2006   Vertigo    Wandering (atrial) pacemaker    a. Remotely per notes.  (  pt. states has a wandering p wave ) no pacemaker   Past Surgical History:  Procedure Laterality Date   ANKLE ARTHODESIS W/ ARTHROSCOPY Right 11/12/2020   ATRIAL ABLATION SURGERY  05/07/2016   fib and flutter   ATRIAL FIBRILLATION ABLATION  2010   at [facility]   ATRIAL FIBRILLATION ABLATION N/A 05/07/2016   Procedure: Atrial Fibrillation Ablation;  Surgeon: Lynwood Rakers, MD;  Location: [facility] INVASIVE CV LAB;  Service: Cardiovascular;  Laterality: N/A;   ATRIAL FIBRILLATION ABLATION N/A 12/18/2022   Procedure: ATRIAL FIBRILLATION ABLATION;  Surgeon: Cindie Ole DASEN, MD;  Location: [facility] INVASIVE CV LAB;  Service: Cardiovascular;  Laterality: N/A;   BREAST CYST ASPIRATION Bilateral    CARDIAC CATHETERIZATION  2008   CARPAL TUNNEL RELEASE Bilateral    CATARACT EXTRACTION Bilateral 2022   CESAREAN SECTION  1986; 1988   COLON RESECTION     COLONOSCOPY  X 2   COLONOSCOPY W/ BIOPSIES AND POLYPECTOMY  X 2   CYSTOSCOPY/URETEROSCOPY/HOLMIUM LASER/STENT PLACEMENT Left 12/23/2019   Procedure: CYSTOSCOPY/URETEROSCOPY/HOLMIUM LASER/STENT PLACEMENT;  Surgeon: Elisabeth Valli BIRCH, MD;  Location: [facility] ORS;  Service: Urology;  Laterality: Left;   ESOPHAGOGASTRODUODENOSCOPY     ESOPHAGOGASTRODUODENOSCOPY (EGD) WITH PROPOFOL  N/A 07/19/2017   Procedure: ESOPHAGOGASTRODUODENOSCOPY (EGD) WITH PROPOFOL ;  Surgeon: Leigh Elspeth SQUIBB, MD;  Location: [facility];  Service: Gastroenterology;  Laterality: N/A;   INGUINAL HERNIA REPAIR Right    KNEE ARTHROSCOPY Right 06/10/2021   Procedure: RIGHT KNEE ARTHROSCOPY;  Surgeon: Sheril Coy, MD;  Location: [facility];  Service: Orthopedics;  Laterality: Right;   LAPAROSCOPIC CHOLECYSTECTOMY  1989   POLYPECTOMY N/A 07/19/2017   Procedure: POLYPECTOMY;  Surgeon: Leigh Elspeth SQUIBB, MD;  Location: [facility];  Service: Gastroenterology;  Laterality: N/A;   PROCTOSCOPY N/A 08/06/2017    Procedure: PROCTOSCOPY;  Surgeon: Sheldon Elspeth, MD;  Location: [facility];  Service: General;  Laterality: N/A;   TMJ ARTHROPLASTY     TOTAL KNEE ARTHROPLASTY Right 01/06/2022   Procedure: RIGHT TOTAL KNEE ARTHROPLASTY;  Surgeon: Sheril Coy, MD;  Location: [facility];  Service: Orthopedics;  Laterality: Right;   TUBAL LIGATION  1988    Allergies  Allergies[1]  Home Medications    Prior to Admission medications  Medication Sig Start Date End Date Taking? Authorizing Provider  acetaminophen  (TYLENOL ) 650 MG CR tablet Take 1,300 mg by mouth in the morning and at bedtime.    [provider]  APPLE CIDER VINEGAR PO Take 450 mg by mouth in the morning.    [provider]  cetirizine (ZYRTEC) 10 MG tablet Take 10 mg by mouth in the morning.    [provider]  Cholecalciferol  (VITAMIN D3) 50 MCG (2000 UT) capsule Take 2,000 Units by mouth in the morning.    [provider]  Continuous Blood Gluc  Sensor (FREESTYLE LIBRE 2 SENSOR) MISC APPLY ONE SENSOR TO THE BACK OF THE UPPER ARM EVERY 14 DAYS    [provider]  dicyclomine  (BENTYL ) 10 MG capsule TAKE 1 CAPSULE BY MOUTH 30 MINS BEFORE MEALS AND AT BEDTIME AS NEEDED FOR PAIN/CRAMPING 12/23/18   Zehr, Jessica D, PA-C  enoxaparin  (LOVENOX ) 100 MG/ML injection Inject 1 mL (100 mg total) into the skin every 12 (twelve) hours. Patient not taking: Reported on 02/22/2024 12/31/23   Cindie Ole DASEN, MD  ezetimibe  (ZETIA ) 10 MG tablet TAKE 1 TABLET BY MOUTH EVERY DAY 07/26/23   Mona Vinie BROCKS, MD  FLOVENT  HFA 220 MCG/ACT inhaler Inhale 1 puff into the lungs 2 (two) times daily. 04/20/16   [provider]  fluticasone  (FLONASE ) 50 MCG/ACT nasal spray Place 1 spray into both nostrils 2 (two) times daily.  06/02/12   [provider]  isosorbide  mononitrate (IMDUR ) 30 MG 24 hr tablet Take 30 mg by mouth every evening. 07/16/23   [provider]  losartan  (COZAAR ) 25 MG tablet Take 1 tablet (25  mg total) by mouth daily. 12/20/13   Allred, Lynwood, MD  meclizine  (ANTIVERT ) 25 MG tablet Take 1 tablet (25 mg total) by mouth 3 (three) times daily as needed for dizziness. 01/19/18   Randol Simmonds, MD  Multiple Vitamin (MULTIVITAMIN WITH MINERALS) TABS tablet Take 1 tablet by mouth in the morning.    [provider]  nitroGLYCERIN  (NITROSTAT ) 0.4 MG SL tablet PLACE 1 TABLET UNDER THE TONGUE EVERY 5 MINUTES AS NEEDED. 05/22/21   Leverne Charlies Helling, PA-C  omeprazole  (PRILOSEC) 40 MG capsule TAKE 1 CAPSULE BY MOUTH DAILY, CAN INCREASE TO 2 TIMES DAILY IF NEEDED Patient taking differently: Take 40 mg by mouth in the morning and at bedtime. 01/14/23   Kerman Vina HERO, NP  OZEMPIC, 1 MG/DOSE, 4 MG/3ML SOPN Inject 1 mg into the skin every Monday. 09/08/22   [provider]  polyethylene glycol (MIRALAX ) 17 g packet Take 17 g by mouth 2 (two) times daily. Titrate as needed Patient taking differently: Take 17 g by mouth 2 (two) times daily as needed (constipation.). 01/23/21   Armbruster, Elspeth SQUIBB, MD  PROAIR  HFA 108 (90 BASE) MCG/ACT inhaler Inhale 2 puffs into the lungs every 6 (six) hours as needed for wheezing or shortness of breath.  03/22/12   [provider]  scopolamine  (TRANSDERM-SCOP) 1 MG/3DAYS Place 1 patch onto the skin as needed. 05/04/23   [provider]  traMADol  (ULTRAM ) 50 MG tablet Take 1 tablet (50 mg total) by mouth every 6 (six) hours as needed. 02/23/24 02/22/25  Vernon Ranks, MD  venlafaxine  XR (EFFEXOR -XR) 37.5 MG 24 hr capsule Take 37.5 mg by mouth daily with breakfast.  11/21/12   [provider]  verapamil  (CALAN ) 80 MG tablet Take 160 mg by mouth 2 (two) times daily.    [provider]  warfarin (COUMADIN ) 2 MG tablet TAKE 4MG  DAILY EXCEPT 6MG  ON TUESDAYS, THURSDAYS,AND SATURDAYS AS DIRECTED BY ANTICOAGULATION CLINIC 02/10/24   Lonni Slain, MD    Physical Exam    Vital Signs:  HILLARI ZUMWALT does not have vital signs  available for review today.  Given telephonic nature of communication, physical exam is limited. AAOx3. NAD. Normal affect.  Speech and respirations are unlabored.  Accessory Clinical Findings    None  Assessment & Plan    1.  Preoperative Cardiovascular Risk Assessment: According to the Revised Cardiac Risk Index (RCRI), her Perioperative Risk of  Major Cardiac Event is (%): 0.4. Her Functional Capacity in METs is: 5.62 according to the [facility] Activity Status Index (DASI).  Therefore, based on ACC/AHA guidelines, patient would be at acceptable risk for the planned procedure without further cardiovascular testing. I will route this recommendation to the requesting party via Epic fax function.  The patient was advised that if she develops new symptoms prior to surgery to contact our office to arrange for a follow-up visit, and she verbalized understanding.  Per office protocol, patient can hold warfarin for 5 days prior to procedure.     Patient WILL need bridging with Lovenox  (enoxaparin ) around procedure.   Patient already scheduled in coumadin  clinic 12/16 at which time a bridge can be coordinated.  A copy of this note will be routed to requesting surgeon.  Time:   Today, I have spent 10 minutes with the patient with telehealth technology discussing medical history, symptoms, and management plan.     Kelly LITTIE Louis, NP  03/03/2024, 10:19 AM      [1]  Allergies Allergen Reactions   Aspirin Other (See Comments)    Makes asthma worse and makes stomach hurt   Banana Swelling and Cough    Inside of the mouth swells   Food Other (See Comments) and Cough    Nuts- mucus membranes inflamed/cough    Iodinated Contrast Media Anaphylaxis    Required epinephrine . Since then, has tolerated with premed. Other reaction(s): wheeze...requires premdication with prednisone  3 days in advance   Latex Hives and Cough    Wheezing/cough  Other reaction(s): hives/wheeze   Meperidine  Hcl  Other (See Comments)    Projectile vomiting    Peanut-Containing Drug Products Swelling and Cough    Inside of the mouth swells   Penicillins Anaphylaxis    Has patient had a PCN reaction causing immediate rash, facial/tongue/throat swelling, SOB or lightheadedness with hypotension:Yes Has patient had a PCN reaction causing severe rash involving mucus membranes or skin necrosis:Yes Has patient had a PCN reaction that required hospitalization:Treated in ER w/epi & breathing treatments Has patient had a PCN reaction occurring within the last 10 years:No If all of the above answers are NO, then may proceed with Cephalosporin use.    Statins Other (See Comments)    Severe leg pain - has tried most of them   Sulfonamide Derivatives Anaphylaxis   Tape Hives and Dermatitis    No plastic tape!!   Alfuzosin Other (See Comments)    Headache, dizziness   Halothane     Listed under anesthesia adverse reactions pt does not know what reaction or if any at preop on 05/26/21.    Hydrocodone  Itching   Other Other (See Comments)    Vascepa- upset stomach   Sulfamethoxazole Hives

## 2024-03-06 NOTE — Progress Notes (Signed)
 Anesthesia Review:  PCP: Cardiologist : Cardiac teleophone visit   PPM/ ICD: Device Orders: Rep Notified:  Chest x-ray : EKG : 03/31/23  Afib aglaton - 12/18/22  Ct card-12/17/22  Echo : 06/29/22  Monitor- 06/21/22  Stress- 2017  Stress test: Cardiac Cath :   Activity level:  Sleep Study/ CPAP : Fasting Blood Sugar :      / Checks Blood Sugar -- times a day:     Prediabetes   Blood Thinner/ Instructions /Last Dose: ASA / Instructions/ Last Dose :    Coumadin  - last dose  Lovenox     02/23/24- cbc/diff and bmp aand PT/INR

## 2024-03-07 ENCOUNTER — Ambulatory Visit: Attending: Cardiology

## 2024-03-07 ENCOUNTER — Other Ambulatory Visit (HOSPITAL_COMMUNITY): Payer: Self-pay

## 2024-03-07 DIAGNOSIS — I480 Paroxysmal atrial fibrillation: Secondary | ICD-10-CM

## 2024-03-07 DIAGNOSIS — D6869 Other thrombophilia: Secondary | ICD-10-CM | POA: Diagnosis present

## 2024-03-07 DIAGNOSIS — I4819 Other persistent atrial fibrillation: Secondary | ICD-10-CM | POA: Diagnosis present

## 2024-03-07 DIAGNOSIS — Z86718 Personal history of other venous thrombosis and embolism: Secondary | ICD-10-CM | POA: Diagnosis present

## 2024-03-07 LAB — POCT INR: INR: 2.2 (ref 2.0–3.0)

## 2024-03-07 MED ORDER — ENOXAPARIN SODIUM 120 MG/0.8ML IJ SOSY
120.0000 mg | PREFILLED_SYRINGE | INTRAMUSCULAR | 0 refills | Status: DC
  Filled 2024-03-07: qty 7.2, 9d supply, fill #0

## 2024-03-07 NOTE — Progress Notes (Signed)
 Description   INR-2.2; resume taking warfarin 2 tablets daily, except 3 tablets every Tuesday, Thursday and Saturday. Recheck INR in 1 week after procedure Call Coumadin  Clinic [phone] Procedure Fax # [phone]

## 2024-03-07 NOTE — Patient Instructions (Addendum)
 Description   INR-2.2; resume taking warfarin 2 tablets daily, except 3 tablets every Tuesday, Thursday and Saturday. Recheck INR in 1 week after procedure Call Coumadin  Clinic [phone] Procedure Fax # [phone]      12/17: Last dose of warfarin.  12/18: No warfarin or enoxaparin  (Lovenox ).  12/19: Inject enoxaparin  120 mg in the fatty abdominal tissue at least 2 inches from the belly button once a day in the morning. No warfarin.  12/20: Inject enoxaparin  in the fatty tissue every 24 hours. No warfarin.  12/21: Inject enoxaparin  in the fatty tissue every 24  hours. No warfarin.  12/22: Inject enoxaparin  in the fatty tissue in the morning at 8 am . No warfarin.  12/23: Procedure Day - No enoxaparin  - Resume warfarin in the evening or as directed by doctor (take an extra half tablet with usual dose for 2 days then resume normal dose).  12/24: Resume enoxaparin  inject in the fatty tissue every 24 hours and take warfarin  12/25: Inject enoxaparin  in the fatty tissue every 24 hours and take warfarin  12/26: Inject enoxaparin  in the fatty tissue every 24 hours and take warfarin  12/27: Inject enoxaparin  in the fatty tissue every 24 hours and take warfarin  12/28: Inject enoxaparin  in the fatty tissue every 24 hours and take warfarin  12/29 : warfarin appt to check INR.

## 2024-03-08 ENCOUNTER — Other Ambulatory Visit: Payer: Self-pay

## 2024-03-08 ENCOUNTER — Encounter (HOSPITAL_COMMUNITY): Payer: Self-pay

## 2024-03-08 ENCOUNTER — Encounter (HOSPITAL_COMMUNITY)
Admission: RE | Admit: 2024-03-08 | Discharge: 2024-03-08 | Disposition: A | Discharge status: Home / Self Care | Source: Ambulatory Visit | Attending: Urology | Admitting: Urology

## 2024-03-08 DIAGNOSIS — J45909 Unspecified asthma, uncomplicated: Secondary | ICD-10-CM | POA: Diagnosis not present

## 2024-03-08 DIAGNOSIS — Z79899 Other long term (current) drug therapy: Secondary | ICD-10-CM | POA: Diagnosis not present

## 2024-03-08 DIAGNOSIS — I2510 Atherosclerotic heart disease of native coronary artery without angina pectoris: Secondary | ICD-10-CM | POA: Insufficient documentation

## 2024-03-08 DIAGNOSIS — I700 Atherosclerosis of aorta: Secondary | ICD-10-CM | POA: Diagnosis not present

## 2024-03-08 DIAGNOSIS — Z86718 Personal history of other venous thrombosis and embolism: Secondary | ICD-10-CM | POA: Insufficient documentation

## 2024-03-08 DIAGNOSIS — G4733 Obstructive sleep apnea (adult) (pediatric): Secondary | ICD-10-CM | POA: Insufficient documentation

## 2024-03-08 DIAGNOSIS — N202 Calculus of kidney with calculus of ureter: Secondary | ICD-10-CM | POA: Insufficient documentation

## 2024-03-08 DIAGNOSIS — Z01818 Encounter for other preprocedural examination: Secondary | ICD-10-CM | POA: Diagnosis present

## 2024-03-08 DIAGNOSIS — I10 Essential (primary) hypertension: Secondary | ICD-10-CM | POA: Diagnosis not present

## 2024-03-08 DIAGNOSIS — D6851 Activated protein C resistance: Secondary | ICD-10-CM | POA: Diagnosis not present

## 2024-03-08 DIAGNOSIS — I480 Paroxysmal atrial fibrillation: Secondary | ICD-10-CM | POA: Diagnosis not present

## 2024-03-08 DIAGNOSIS — E119 Type 2 diabetes mellitus without complications: Secondary | ICD-10-CM | POA: Insufficient documentation

## 2024-03-08 HISTORY — DX: Activated protein C resistance: D68.51

## 2024-03-08 HISTORY — DX: Type 2 diabetes mellitus without complications: E11.9

## 2024-03-08 NOTE — Patient Instructions (Signed)
 SURGICAL WAITING ROOM VISITATION  Patients having surgery or a procedure may have no more than 2 support people in the waiting area - these visitors may rotate.    Children ages 77 and under will not be able to visit patients in [facility] under most circumstances.   Visitors with respiratory illnesses are discouraged from visiting and should remain at home.  If the patient needs to stay at the hospital during part of their recovery, the visitor guidelines for inpatient rooms apply. Pre-op nurse will coordinate an appropriate time for 1 support person to accompany patient in pre-op.  This support person may not rotate.    Please refer to the [facility] website for the visitor guidelines for Inpatients (after your surgery is over and you are in a regular room).       Your procedure is scheduled on:   03/14/2024    Report to [facility] Main Entrance    Report to admitting at   1100AM   Call this number if you have problems the morning of surgery [phone]   Do not eat food :After Midnight.   After Midnight you may have the following liquids until __1000____ AM DAY OF SURGERY  Water  Non-Citrus Juices (without pulp, NO RED-Apple, White grape, White cranberry) Black Coffee (NO MILK/CREAM OR CREAMERS, sugar ok)  Clear Tea (NO MILK/CREAM OR CREAMERS, sugar ok) regular and decaf                             Plain Jell-O (NO RED)                                           Fruit ices (not with fruit pulp, NO RED)                                     Popsicles (NO RED)                                                               Sports drinks like Gatorade (NO RED)                           If you have questions, please contact your surgeons office.       Oral Hygiene is also important to reduce your risk of infection.                                    Remember - BRUSH YOUR TEETH THE MORNING OF SURGERY WITH YOUR REGULAR TOOTHPASTE  DENTURES WILL BE REMOVED  PRIOR TO SURGERY PLEASE DO NOT APPLY Poly grip OR ADHESIVES!!!   Do NOT smoke after Midnight   Stop all vitamins and herbal supplements 7 days before surgery.   Take these medicines the morning of surgery with A SIP OF WATER :   zyrtec, inhalers as usual and bring, flonase  meclizine  if needed, omeprazole , effexor , calan ,   DO NOT TAKE  ANY ORAL DIABETIC MEDICATIONS DAY OF YOUR SURGERY  Bring CPAP mask and tubing day of surgery.                              You may not have any metal on your body including hair pins, jewelry, and body piercing             Do not wear make-up, lotions, powders, perfumes/cologne, or deodorant  Do not wear nail polish including gel and S&S, artificial/acrylic nails, or any other type of covering on natural nails including finger and toenails. If you have artificial nails, gel coating, etc. that needs to be removed by a nail salon please have this removed prior to surgery or surgery may need to be canceled/ delayed if the surgeon/ anesthesia feels like they are unable to be safely monitored.   Do not shave  48 hours prior to surgery.               Men may shave face and neck.   Do not bring valuables to the hospital. [facility] IS NOT             RESPONSIBLE   FOR VALUABLES.   Contacts, glasses, dentures or bridgework may not be worn into surgery.   Bring small overnight bag day of surgery.   DO NOT BRING YOUR HOME MEDICATIONS TO THE HOSPITAL. PHARMACY WILL DISPENSE MEDICATIONS LISTED ON YOUR MEDICATION LIST TO YOU DURING YOUR ADMISSION IN THE HOSPITAL!    Patients discharged on the day of surgery will not be allowed to drive home.  Someone NEEDS to stay with you for the first 24 hours after anesthesia.   Special Instructions: Bring a copy of your healthcare power of attorney and living will documents the day of surgery if you haven't scanned them before.              Please read over the following fact sheets you were given: IF YOU HAVE QUESTIONS  ABOUT YOUR PRE-OP INSTRUCTIONS PLEASE CALL [phone].   If you received a COVID test during your pre-op visit  it is requested that you wear a mask when out in public, stay away from anyone that may not be feeling well and notify your surgeon if you develop symptoms. If you test positive for Covid or have been in contact with anyone that has tested positive in the last 10 days please notify you surgeon.    [facility] - Preparing for Surgery Before surgery, you can play an important role.  Because skin is not sterile, your skin needs to be as free of germs as possible.  You can reduce the number of germs on your skin by washing with CHG (chlorahexidine gluconate) soap before surgery.  CHG is an antiseptic cleaner which kills germs and bonds with the skin to continue killing germs even after washing. Please DO NOT use if you have an allergy to CHG or antibacterial soaps.  If your skin becomes reddened/irritated stop using the CHG and inform your nurse when you arrive at Short Stay. Do not shave (including legs and underarms) for at least 48 hours prior to the first CHG shower.  You may shave your face/neck. Please follow these instructions carefully:  1.  Shower with CHG Soap the night before surgery and the  morning of Surgery.  2.  If you choose to wash your hair, wash your hair first as usual with your  normal  shampoo.  3.  After you shampoo, rinse your hair and body thoroughly to remove the  shampoo.                           4.  Use CHG as you would any other liquid soap.  You can apply chg directly  to the skin and wash                       Gently with a scrungie or clean washcloth.  5.  Apply the CHG Soap to your body ONLY FROM THE NECK DOWN.   Do not use on face/ open                           Wound or open sores. Avoid contact with eyes, ears mouth and genitals (private parts).                       Wash face,  Genitals (private parts) with your normal soap.             6.  Wash thoroughly,  paying special attention to the area where your surgery  will be performed.  7.  Thoroughly rinse your body with warm water  from the neck down.  8.  DO NOT shower/wash with your normal soap after using and rinsing off  the CHG Soap.                9.  Pat yourself dry with a clean towel.            10.  Wear clean pajamas.            11.  Place clean sheets on your bed the night of your first shower and do not  sleep with pets. Day of Surgery : Do not apply any lotions/deodorants the morning of surgery.  Please wear clean clothes to the hospital/surgery center.  FAILURE TO FOLLOW THESE INSTRUCTIONS MAY RESULT IN THE CANCELLATION OF YOUR SURGERY PATIENT SIGNATURE_________________________________  NURSE SIGNATURE__________________________________  ________________________________________________________________________

## 2024-03-09 NOTE — Anesthesia Preprocedure Evaluation (Signed)
 Anesthesia Evaluation    Airway        Dental   Pulmonary           Cardiovascular hypertension,      Neuro/Psych    GI/Hepatic   Endo/Other  diabetes    Renal/GU      Musculoskeletal   Abdominal   Peds  Hematology   Anesthesia Other Findings   Reproductive/Obstetrics                              Anesthesia Physical Anesthesia Plan  ASA:   Anesthesia Plan:    Post-op Pain Management:    Induction:   PONV Risk Score and Plan:   Airway Management Planned:   Additional Equipment:   Intra-op Plan:   Post-operative Plan:   Informed Consent:   Plan Discussed with:   Anesthesia Plan Comments: (See PAT note from 12/17)         Anesthesia Quick Evaluation

## 2024-03-09 NOTE — Progress Notes (Signed)
 Case: [id number] Date/Time: 03/14/24 1115   Procedure: CYSTOSCOPY/URETEROSCOPY/HOLMIUM LASER/STENT PLACEMENT (Left) - CYSTOSCOPY/LEFT URETEROSCOPY/HOLMIUM LASER/STENT PLACEMENT   Anesthesia type: General   Diagnosis: Calculus of kidney with calculus of ureter [N20.2]   Pre-op diagnosis: LEFT URETERAL AND RENAL KIDNEY STONE   Location: [facility] PROC[facility]   Surgeons: Elisabeth Valli BIRCH, MD       DISCUSSION: [name] is a 68 yo female with PMH of HTN, mild non obstructive CAD (by imaging), PAF s/p several ablations, on Warfarin, heart murmur (mild MR on Echo), hx of recurrent DVT, Factor V Leiden, OSA (CPAP intolerance), asthma, GERD, small hiatal hernia, DM (A1c 5.9), arthritis, PONV  Pt with multiple allergies. Food, contrast, latex, some antibiotics. Has halothane listed as an allergy. Pt unsure of reaction in comments.  Patient with history of A-fib/flutter and follows with Cardiology. She is on warfarin and verapamil .  She had an ablation in 2010, 2018, and last in 2024. She has not had recurrence of A.fib when she was last seen in clinic on 03/31/23. She had cardiac clearance visit on 03/03/2024 and patient reported doing well, without symptoms concerning for recurrent A-fib.  Cleared for surgery:  Preoperative Cardiovascular Risk Assessment: According to the Revised Cardiac Risk Index (RCRI), her Perioperative Risk of Major Cardiac Event is (%): 0.4. Her Functional Capacity in METs is: 5.62 according to the Duke Activity Status Index (DASI). Therefore, based on ACC/AHA guidelines, patient would be at acceptable risk for the planned procedure without further cardiovascular testing. I will route this recommendation to the requesting party via Epic fax function. The patient was advised that if she develops new symptoms prior to surgery to contact our office to arrange for a follow-up visit, and she verbalized understanding. Per office protocol, patient can hold warfarin for 5 days  prior to procedure.   Patient WILL need bridging with Lovenox  (enoxaparin ) around procedure. Patient already scheduled in coumadin  clinic 12/16 at which time a bridge can be coordinated.  Seen by PCP in August 2025. All issues seem controlled. Hx of asthma and uses inhalers. DM and HTN controlled.  LD Warfarin 12/17. Lovenox  Bridge 12/18-12/22 LD GLP1: 12/12   VS:  Wt Readings from Last 3 Encounters:  02/22/24 90.4 kg  03/31/23 90.4 kg  01/15/23 92.1 kg   Temp Readings from Last 3 Encounters:  02/23/24 36.9 C (Oral)  12/18/22 37.3 C (Temporal)  01/10/22 37 C (Oral)   BP Readings from Last 3 Encounters:  02/23/24 (S) (!) 149/66  03/31/23 134/68  01/15/23 134/76   Pulse Readings from Last 3 Encounters:  02/23/24 71  03/31/23 67  01/15/23 76     PROVIDERS: Seabron Lenis, MD   LABS: Labs reviewed: Acceptable for surgery. INR ordered for DOS (all labs ordered are listed, but only abnormal results are displayed)  Labs Reviewed - No data to display   CT Renal 02/21/24:  IMPRESSION: 1. 9 mm calculus at the left ureteropelvic junction with mild dilation of the left renal pelvis. No frank hydronephrosis. 2. Additional nonobstructive left renal calculi measuring up to 5 mm. 3. Nonobstructive right renal calculi measuring up to 7 mm. 4.  Aortic Atherosclerosis (ICD10-I70.0).    EKG 03/31/2023:  Normal sinus rhythm Low voltage QRS   Ablation 12/18/2022:  CONCLUSIONS: 1. Successful redo PVI (reisolation of the left lower pulmonary vein and junction of the right superior pulmonary vein and roof) 2. Successful ablation/isolation of the posterior wall 3.  Successful ablation of the cavotricuspid isthmus for typical  atrial flutter 4. Intracardiac echo reveals trivial pericardial effusion and normal left atrial architecture 5. No early apparent complications. 6. Colchicine  0.6mg  PO BID x 5 days 7. Protonix  40mg  PO daily x 45 days   Cardiac CT  12/11/22:   IMPRESSION: 1. There is normal pulmonary vein drainage into the left atrium with ostial measurements above.   2. There is no thrombus in the left atrial appendage.   3. The esophagus runs in the left atrial midline and is not in proximity to any of the pulmonary vein ostia.   4. No PFO/ASD.   5. Normal coronary origin. Right dominance.   6. CAC score of 56.5, which is 69th percentile for age-, race-, and sex-matched controls.   Echo 06/29/23:  IMPRESSIONS    1. Left ventricular ejection fraction, by estimation, is 60 to 65%. The left ventricle has normal function. The left ventricle has no regional wall motion abnormalities. Left ventricular diastolic parameters are indeterminate.  2. Right ventricular systolic function is normal. The right ventricular size is normal. There is normal pulmonary artery systolic pressure. The estimated right ventricular systolic pressure is 27.0 mmHg.  3. The mitral valve is normal in structure. Mild mitral valve regurgitation. No evidence of mitral stenosis.  4. The aortic valve is tricuspid. Aortic valve regurgitation is not visualized. No aortic stenosis is present.  5. The inferior vena cava is normal in size with greater than 50% respiratory variability, suggesting right atrial pressure of 3 mmHg.  Past Medical History:  Diagnosis Date   Allergic rhinitis    Anginal pain    Arthritis    back (05/07/2016)   Asthma    Atrial flutter (HCC)    a. Remotely per notes.   Carpal tunnel syndrome, bilateral    Colon polyps    Diabetes mellitus without complication (HCC)    Diverticulitis    Dizziness    DVT (deep venous thrombosis) (HCC) 1980s x2 -    between birth of two sons. Saw hematology - was told she has a hypercoagulable disorder, should be on Coumadin  lifelong.   Dysrhythmia    atrial fib   Factor V Leiden    Family history of adverse reaction to anesthesia    mother gets PONV   Gallbladder disease    GERD  (gastroesophageal reflux disease)    GIB (gastrointestinal bleeding) 04/14/2018   Halothane adverse reaction    narrow small opening   Heart murmur    Hiatal hernia    History of hiatal hernia    small one/CTA 05/01/2016 (05/07/2016)   History of kidney stones    HTN (hypertension)    Hypercholesteremia    hx; brought it down w/diet (05/07/2016)   Hyperglycemia    a. A1c 6.1 in 2014.   Inguinal hernia    Iron  deficiency anemia due to chronic blood loss 04/14/2018   Iron  malabsorption 04/14/2018   Left sided sciatica    Normal cardiac stress test 06/2012   Obesity    OSA (obstructive sleep apnea)    a. Mild, did not tolerate CPAP (05/07/2016)   PAF (paroxysmal atrial fibrillation) (HCC)    a. s/p afib ablation at [facility] 2010 (Dr. Arlon). b. Recurrent AF s/p DCCV 06/2010. c. On flecainide .    Paroxysmal atrial fibrillation (HCC) 09/09/2009   Qualifier: Diagnosis of  By: Lawernce CMA, Jewel     PONV (postoperative nausea and vomiting)    PPD positive, treated 1977   treated for 1 yr after exposure to patient  RSV infection ~ 2006   Vertigo    Wandering (atrial) pacemaker    a. Remotely per notes.  (  pt. states has a wandering p wave ) no pacemaker    Past Surgical History:  Procedure Laterality Date   ANKLE ARTHODESIS W/ ARTHROSCOPY Right 11/12/2020   ATRIAL ABLATION SURGERY  05/07/2016   fib and flutter   ATRIAL FIBRILLATION ABLATION  2010   at [facility]   ATRIAL FIBRILLATION ABLATION N/A 05/07/2016   Procedure: Atrial Fibrillation Ablation;  Surgeon: Lynwood Rakers, MD;  Location: [facility] INVASIVE CV LAB;  Service: Cardiovascular;  Laterality: N/A;   ATRIAL FIBRILLATION ABLATION N/A 12/18/2022   Procedure: ATRIAL FIBRILLATION ABLATION;  Surgeon: Cindie Ole DASEN, MD;  Location: [facility] INVASIVE CV LAB;  Service: Cardiovascular;  Laterality: N/A;   bilateral cataract surgery      BREAST CYST ASPIRATION Bilateral    CARDIAC CATHETERIZATION  2008   CARPAL TUNNEL  RELEASE Bilateral    CATARACT EXTRACTION Bilateral 2022   CESAREAN SECTION  1986; 1988   COLON RESECTION     COLONOSCOPY  X 2   COLONOSCOPY W/ BIOPSIES AND POLYPECTOMY  X 2   CYSTOSCOPY/URETEROSCOPY/HOLMIUM LASER/STENT PLACEMENT Left 12/23/2019   Procedure: CYSTOSCOPY/URETEROSCOPY/HOLMIUM LASER/STENT PLACEMENT;  Surgeon: Elisabeth Valli BIRCH, MD;  Location[facility];  Service: Urology;  Laterality: Left;   ESOPHAGOGASTRODUODENOSCOPY     ESOPHAGOGASTRODUODENOSCOPY (EGD) WITH PROPOFOL  N/A 07/19/2017   Procedure: ESOPHAGOGASTRODUODENOSCOPY (EGD) WITH PROPOFOL ;  Surgeon: Leigh Elspeth SQUIBB, MD;  Location[facility];  Service: Gastroenterology;  Laterality: N/A;   INGUINAL HERNIA REPAIR Right    KNEE ARTHROSCOPY Right 06/10/2021   Procedure: RIGHT KNEE ARTHROSCOPY;  Surgeon: Sheril Coy, MD;  Location: [facility];  Service: Orthopedics;  Laterality: Right;   LAPAROSCOPIC CHOLECYSTECTOMY  1989   POLYPECTOMY N/A 07/19/2017   Procedure: POLYPECTOMY;  Surgeon: Leigh Elspeth SQUIBB, MD;  [facility];  Service: Gastroenterology;  Laterality: N/A;   PROCTOSCOPY N/A 08/06/2017   Procedure: PROCTOSCOPY;  Surgeon: Sheldon Elspeth, MD;  Location: [facility];  Service: General;  Laterality: N/A;   TMJ ARTHROPLASTY     TOTAL KNEE ARTHROPLASTY Right 01/06/2022   Procedure: RIGHT TOTAL KNEE ARTHROPLASTY;  Surgeon: Sheril Coy, MD;  Location[facility];  Service: Orthopedics;  Laterality: Right;   trigger finger release on right      TUBAL LIGATION  1988    MEDICATIONS:  acetaminophen  (TYLENOL ) 650 MG CR tablet   APPLE CIDER VINEGAR PO   cetirizine (ZYRTEC) 10 MG tablet   Cholecalciferol  (VITAMIN D3) 50 MCG (2000 UT) capsule   Continuous Blood Gluc Sensor (FREESTYLE LIBRE 2 SENSOR) MISC   dicyclomine  (BENTYL ) 10 MG capsule   enoxaparin  (LOVENOX ) 120 MG/0.8ML injection   ezetimibe  (ZETIA ) 10 MG tablet   FLOVENT  HFA 220 MCG/ACT inhaler   fluticasone  (FLONASE ) 50 MCG/ACT nasal spray   isosorbide   mononitrate (IMDUR ) 30 MG 24 hr tablet   losartan  (COZAAR ) 25 MG tablet   meclizine  (ANTIVERT ) 25 MG tablet   Multiple Vitamin (MULTIVITAMIN WITH MINERALS) TABS tablet   nitroGLYCERIN  (NITROSTAT ) 0.4 MG SL tablet   omeprazole  (PRILOSEC) 40 MG capsule   OZEMPIC, 1 MG/DOSE, 4 MG/3ML SOPN   polyethylene glycol (MIRALAX ) 17 g packet   PROAIR  HFA 108 (90 BASE) MCG/ACT inhaler   scopolamine  (TRANSDERM-SCOP) 1 MG/3DAYS   traMADol  (ULTRAM ) 50 MG tablet   venlafaxine  XR (EFFEXOR -XR) 37.5 MG 24 hr capsule   verapamil  (CALAN ) 80 MG tablet   warfarin (COUMADIN ) 2 MG tablet   No  current facility-administered medications for this encounter.   Burnard CHRISTELLA Odis DEVONNA [facility] Short Stay/Anesthesiology [facility] Phone [phone] 03/09/2024 10:17 AM

## 2024-03-14 ENCOUNTER — Encounter (HOSPITAL_COMMUNITY): Payer: Self-pay | Admitting: Medical

## 2024-03-14 ENCOUNTER — Ambulatory Visit (HOSPITAL_COMMUNITY)

## 2024-03-14 ENCOUNTER — Other Ambulatory Visit: Payer: Self-pay

## 2024-03-14 ENCOUNTER — Encounter: Admission: RE | Disposition: A | Payer: Self-pay | Attending: Urology

## 2024-03-14 ENCOUNTER — Encounter (HOSPITAL_COMMUNITY): Payer: Self-pay | Admitting: Urology

## 2024-03-14 ENCOUNTER — Ambulatory Visit (HOSPITAL_COMMUNITY)
Admission: RE | Admit: 2024-03-14 | Discharge: 2024-03-14 | Disposition: A | Discharge status: Home / Self Care | Attending: Urology | Admitting: Urology

## 2024-03-14 ENCOUNTER — Ambulatory Visit (HOSPITAL_COMMUNITY): Admitting: Anesthesiology

## 2024-03-14 DIAGNOSIS — I4891 Unspecified atrial fibrillation: Secondary | ICD-10-CM | POA: Insufficient documentation

## 2024-03-14 DIAGNOSIS — Z86718 Personal history of other venous thrombosis and embolism: Secondary | ICD-10-CM | POA: Insufficient documentation

## 2024-03-14 DIAGNOSIS — I209 Angina pectoris, unspecified: Secondary | ICD-10-CM | POA: Insufficient documentation

## 2024-03-14 DIAGNOSIS — N202 Calculus of kidney with calculus of ureter: Secondary | ICD-10-CM | POA: Insufficient documentation

## 2024-03-14 DIAGNOSIS — Z7985 Long-term (current) use of injectable non-insulin antidiabetic drugs: Secondary | ICD-10-CM | POA: Diagnosis not present

## 2024-03-14 DIAGNOSIS — I10 Essential (primary) hypertension: Secondary | ICD-10-CM | POA: Diagnosis not present

## 2024-03-14 DIAGNOSIS — G4730 Sleep apnea, unspecified: Secondary | ICD-10-CM

## 2024-03-14 DIAGNOSIS — N201 Calculus of ureter: Secondary | ICD-10-CM

## 2024-03-14 DIAGNOSIS — Z01818 Encounter for other preprocedural examination: Secondary | ICD-10-CM

## 2024-03-14 DIAGNOSIS — I340 Nonrheumatic mitral (valve) insufficiency: Secondary | ICD-10-CM | POA: Insufficient documentation

## 2024-03-14 DIAGNOSIS — K219 Gastro-esophageal reflux disease without esophagitis: Secondary | ICD-10-CM | POA: Insufficient documentation

## 2024-03-14 DIAGNOSIS — K449 Diaphragmatic hernia without obstruction or gangrene: Secondary | ICD-10-CM | POA: Insufficient documentation

## 2024-03-14 DIAGNOSIS — J45909 Unspecified asthma, uncomplicated: Secondary | ICD-10-CM | POA: Insufficient documentation

## 2024-03-14 DIAGNOSIS — Z7901 Long term (current) use of anticoagulants: Secondary | ICD-10-CM | POA: Diagnosis not present

## 2024-03-14 DIAGNOSIS — D6851 Activated protein C resistance: Secondary | ICD-10-CM

## 2024-03-14 DIAGNOSIS — E119 Type 2 diabetes mellitus without complications: Secondary | ICD-10-CM | POA: Diagnosis not present

## 2024-03-14 HISTORY — PX: CYSTOSCOPY/URETEROSCOPY/HOLMIUM LASER/STENT PLACEMENT: SHX6546

## 2024-03-14 LAB — GLUCOSE, CAPILLARY
Glucose-Capillary: 107 mg/dL — ABNORMAL HIGH (ref 70–99)
Glucose-Capillary: 130 mg/dL — ABNORMAL HIGH (ref 70–99)

## 2024-03-14 LAB — PROTIME-INR
INR: 1 (ref 0.8–1.2)
Prothrombin Time: 13.6 s (ref 11.4–15.2)

## 2024-03-14 SURGERY — CYSTOSCOPY/URETEROSCOPY/HOLMIUM LASER/STENT PLACEMENT
Anesthesia: General | Site: Ureter | Laterality: Left

## 2024-03-14 MED ORDER — TRAMADOL HCL 50 MG PO TABS: 50.0000 mg | ORAL_TABLET | Freq: Four times a day (QID) | ORAL | 0 refills | Status: AC | PRN

## 2024-03-14 MED ORDER — MIDAZOLAM HCL (PF) 2 MG/2ML IJ SOLN
INTRAMUSCULAR | Status: DC | PRN
  Administered 2024-03-14: 2 mg via INTRAVENOUS

## 2024-03-14 MED ORDER — PROPOFOL 500 MG/50ML IV EMUL
INTRAVENOUS | Status: DC | PRN
  Administered 2024-03-14: 175 ug/kg/min via INTRAVENOUS

## 2024-03-14 MED ORDER — SCOPOLAMINE 1 MG/3DAYS TD PT72
1.0000 | MEDICATED_PATCH | TRANSDERMAL | Status: DC
  Administered 2024-03-14: 1 mg via TRANSDERMAL
  Filled 2024-03-14: qty 1

## 2024-03-14 MED ORDER — DROPERIDOL 2.5 MG/ML IJ SOLN
INTRAMUSCULAR | Status: AC
  Filled 2024-03-14: qty 2

## 2024-03-14 MED ORDER — IOHEXOL 300 MG/ML  SOLN
INTRAMUSCULAR | Status: DC | PRN
  Administered 2024-03-14: 7 mL

## 2024-03-14 MED ORDER — MORPHINE SULFATE (PF) 2 MG/ML IV SOLN
INTRAVENOUS | Status: AC
  Filled 2024-03-14: qty 1

## 2024-03-14 MED ORDER — ORAL CARE MOUTH RINSE: 15.0000 mL | Freq: Once | OROMUCOSAL | Status: AC

## 2024-03-14 MED ORDER — MORPHINE SULFATE (PF) 2 MG/ML IV SOLN
1.0000 mg | INTRAVENOUS | Status: DC | PRN
  Administered 2024-03-14: 1 mg via INTRAVENOUS
  Administered 2024-03-14: 2 mg via INTRAVENOUS
  Administered 2024-03-14: 1 mg via INTRAVENOUS

## 2024-03-14 MED ORDER — CIPROFLOXACIN IN D5W 400 MG/200ML IV SOLN
400.0000 mg | INTRAVENOUS | Status: AC
  Administered 2024-03-14: 400 mg via INTRAVENOUS

## 2024-03-14 MED ORDER — ONDANSETRON HCL 4 MG/2ML IJ SOLN
INTRAMUSCULAR | Status: AC
  Filled 2024-03-14: qty 2

## 2024-03-14 MED ORDER — INSULIN ASPART 100 UNIT/ML IJ SOLN: 0.0000 [IU] | INTRAMUSCULAR | Status: DC | PRN

## 2024-03-14 MED ORDER — PROPOFOL 10 MG/ML IV BOLUS
INTRAVENOUS | Status: AC
  Filled 2024-03-14: qty 20

## 2024-03-14 MED ORDER — LACTATED RINGERS IV SOLN: INTRAVENOUS | Status: DC

## 2024-03-14 MED ORDER — PROPOFOL 10 MG/ML IV BOLUS
INTRAVENOUS | Status: DC | PRN
  Administered 2024-03-14: 140 mg via INTRAVENOUS

## 2024-03-14 MED ORDER — LIDOCAINE HCL (PF) 2 % IJ SOLN
INTRAMUSCULAR | Status: AC
  Filled 2024-03-14: qty 5

## 2024-03-14 MED ORDER — ONDANSETRON HCL 4 MG/2ML IJ SOLN
INTRAMUSCULAR | Status: DC | PRN
  Administered 2024-03-14: 4 mg via INTRAVENOUS

## 2024-03-14 MED ORDER — FENTANYL CITRATE (PF) 100 MCG/2ML IJ SOLN
INTRAMUSCULAR | Status: AC
  Filled 2024-03-14: qty 2

## 2024-03-14 MED ORDER — PROPOFOL 1000 MG/100ML IV EMUL
INTRAVENOUS | Status: AC
  Filled 2024-03-14: qty 100

## 2024-03-14 MED ORDER — ACETAMINOPHEN 500 MG PO TABS: 1000.0000 mg | ORAL_TABLET | Freq: Once | ORAL | Status: DC

## 2024-03-14 MED ORDER — GLYCOPYRROLATE 0.2 MG/ML IJ SOLN
INTRAMUSCULAR | Status: DC | PRN
  Administered 2024-03-14: .2 mg via INTRAVENOUS

## 2024-03-14 MED ORDER — SODIUM CHLORIDE 0.9 % IR SOLN
Status: DC | PRN
  Administered 2024-03-14: 3000 mL

## 2024-03-14 MED ORDER — CIPROFLOXACIN HCL 500 MG PO TABS: 500.0000 mg | ORAL_TABLET | Freq: Once | ORAL | 0 refills | Status: AC

## 2024-03-14 MED ORDER — MIDAZOLAM HCL 2 MG/2ML IJ SOLN
INTRAMUSCULAR | Status: AC
  Filled 2024-03-14: qty 2

## 2024-03-14 MED ORDER — CIPROFLOXACIN IN D5W 400 MG/200ML IV SOLN
INTRAVENOUS | Status: AC
  Filled 2024-03-14: qty 200

## 2024-03-14 MED ORDER — LIDOCAINE HCL (PF) 2 % IJ SOLN
INTRAMUSCULAR | Status: DC | PRN
  Administered 2024-03-14: 80 mg via INTRADERMAL

## 2024-03-14 MED ORDER — DROPERIDOL 2.5 MG/ML IJ SOLN
0.6250 mg | Freq: Once | INTRAMUSCULAR | Status: AC | PRN
  Administered 2024-03-14: 0.625 mg via INTRAVENOUS

## 2024-03-14 MED ORDER — DEXAMETHASONE SOD PHOSPHATE PF 10 MG/ML IJ SOLN
INTRAMUSCULAR | Status: DC | PRN
  Administered 2024-03-14: 4 mg via INTRAVENOUS

## 2024-03-14 MED ORDER — CHLORHEXIDINE GLUCONATE 0.12 % MT SOLN
15.0000 mL | Freq: Once | OROMUCOSAL | Status: AC
  Administered 2024-03-14: 15 mL via OROMUCOSAL

## 2024-03-14 MED ORDER — FENTANYL CITRATE (PF) 100 MCG/2ML IJ SOLN
INTRAMUSCULAR | Status: DC | PRN
  Administered 2024-03-14 (×2): 50 ug via INTRAVENOUS

## 2024-03-14 SURGICAL SUPPLY — 18 items
BAG URO CATCHER STRL LF (MISCELLANEOUS) ×1 IMPLANT
BASKET ZERO TIP NITINOL 2.4FR (BASKET) IMPLANT
CATH URETL OPEN 5X70 (CATHETERS) ×1 IMPLANT
CLOTH BEACON ORANGE TIMEOUT ST (SAFETY) ×1 IMPLANT
DRSG TEGADERM 2-3/8X2-3/4 SM (GAUZE/BANDAGES/DRESSINGS) IMPLANT
FIBER LASER MOSES 200 DFL (Laser) IMPLANT
GLOVE SURG SS PI 6.5 STRL IVOR (GLOVE) IMPLANT
GOWN STRL REUS W/ TWL LRG LVL3 (GOWN DISPOSABLE) ×1 IMPLANT
GUIDEWIRE STR DUAL SENSOR (WIRE) ×1 IMPLANT
KIT TURNOVER KIT A (KITS) ×1 IMPLANT
MANIFOLD NEPTUNE II (INSTRUMENTS) ×1 IMPLANT
PACK CYSTO (CUSTOM PROCEDURE TRAY) ×1 IMPLANT
SHEATH NAVIGATOR HD 11/13X28 (SHEATH) IMPLANT
SHEATH NAVIGATOR HD 11/13X36 (SHEATH) IMPLANT
STENT URET 6FRX24 CONTOUR (STENTS) IMPLANT
TRACTIP FLEXIVA PULS ID 200XHI (Laser) IMPLANT
TUBING CONNECTING 10 (TUBING) ×1 IMPLANT
TUBING UROLOGY SET (TUBING) ×1 IMPLANT

## 2024-03-14 NOTE — Anesthesia Postprocedure Evaluation (Signed)
"   Anesthesia Post Note  Patient: Theo Reither Berenson  Procedure(s) Performed: CYSTOSCOPY/URETEROSCOPY/HOLMIUM LASER/STENT PLACEMENT (Left: Ureter)     Patient location during evaluation: PACU Anesthesia Type: General Level of consciousness: sedated and patient cooperative Pain management: pain level controlled Vital Signs Assessment: post-procedure vital signs reviewed and stable Respiratory status: spontaneous breathing Cardiovascular status: stable Anesthetic complications: no   No notable events documented.  Last Vitals:  Vitals:   03/14/24 1300 03/14/24 1315  BP: (!) 169/83 (!) 165/48  Pulse: 62 (!) 58  Resp: 18 18  Temp:    SpO2: 94% 93%    Last Pain:  Vitals:   03/14/24 1315  TempSrc:   PainSc: Asleep                 Norleen Pope      "

## 2024-03-14 NOTE — Transfer of Care (Signed)
 Immediate Anesthesia Transfer of Care Note  Patient: [name]  Procedure(s) Performed: CYSTOSCOPY/URETEROSCOPY/HOLMIUM LASER/STENT PLACEMENT (Left: Ureter)  Patient Location: PACU  Anesthesia Type:General  Level of Consciousness: drowsy  Airway & Oxygen Therapy: Patient Spontanous Breathing and Patient connected to face mask oxygen  Post-op Assessment: Report given to RN, Post -op Vital signs reviewed and stable, and Patient moving all extremities X 4  Post vital signs: Reviewed and stable  Last Vitals:  Vitals Value Taken Time  BP 143/73 03/14/24 11:33  Temp    Pulse 68 03/14/24 11:34  Resp 17 03/14/24 11:34  SpO2 96 % 03/14/24 11:34  Vitals shown include unfiled device data.  Last Pain:  Vitals:   03/14/24 0931  TempSrc:   PainSc: 5       Patients Stated Pain Goal: 5 (03/14/24 0931)  Complications: No notable events documented.

## 2024-03-14 NOTE — Anesthesia Procedure Notes (Signed)
 Procedure Name: LMA Insertion Date/Time: 03/14/2024 10:22 AM  Performed by: Brandy Almarie BROCKS, CRNAPre-anesthesia Checklist: Patient identified, Suction available, Emergency Drugs available and Patient being monitored Oxygen Delivery Method: Circle system utilized Preoxygenation: Pre-oxygenation with 100% oxygen Induction Type: IV induction LMA: LMA inserted LMA Size: 4.0 Number of attempts: 1 Placement Confirmation: ETT inserted through vocal cords under direct vision, positive ETCO2 and breath sounds checked- equal and bilateral Dental Injury: Teeth and Oropharynx as per pre-operative assessment

## 2024-03-14 NOTE — Op Note (Signed)
 Preoperative diagnosis: bilateral renal pelvis calculi  Postoperative diagnosis: same  Procedure:  Cystoscopy bilateral ureteroscopy, laser lithotripsy, basket stone extraction bilateral 92F x 24cm ureteral stent placement - with string bilateral retrograde pyelography with interpretation  Surgeon: Valli Shank, MD  Anesthesia: General  Complications: None  Intraoperative findings:  Normal urethra Bilateral orthotropic ureteral orifices Left retrograde pyelogram demonstrated normal caliber ureter with a filling defect in the renal pelvis consistent with known stone. Right retrograde pyelogram demonstrated normal caliber ureter with a filling defect in the right renal pelvis consistent with known stone Bladder mucosa normal without masses   EBL: Minimal  Specimens: bilateral ureteral calculi  Disposition of specimens: [facility] for stone analysis  Indication: [name] is a 68 y.o.   patient with a  left ureteral stone and associated left symptoms.  She began having right ureteral symptoms and I was made aware of this in the preop area.  We decided to add the right side on for possible ureteroscopy/laser lithotripsy and stent placement.  After reviewing the management options for treatment, the patient elected to proceed with the above surgical procedure(s). We have discussed the potential benefits and risks of the procedure, side effects of the proposed treatment, the likelihood of the patient achieving the goals of the procedure, and any potential problems that might occur during the procedure or recuperation. Informed consent has been obtained.   Description of procedure:  The patient was taken to the operating room and general anesthesia was induced.  The patient was placed in the dorsal lithotomy position, prepped and draped in the usual sterile fashion, and preoperative antibiotics were administered. A preoperative time-out was performed.    Cystourethroscopy was performed.  The patients urethra was examined and was normal.  The bladder was then systematically examined in its entirety. There was no evidence for any bladder tumors, stones, or other mucosal pathology.    Attention then turned to the left ureteral orifice and a ureteral catheter was used to intubate the ureteral orifice.  Omnipaque  contrast was injected through the ureteral catheter and a retrograde pyelogram was performed with findings as dictated above.  A 0.38 sensor guidewire was then advanced up the left ureter into the renal pelvis under fluoroscopic guidance. The 6 Fr semirigid ureteroscope was then advanced into the ureter next to the guidewire to the proximal ureter however no stone was seen.  A second sensor wire was advanced through the ureteroscope and up to the kidney and fluoroscopic guidance.  The ureteroscope was removed and 1 wire was secured as a safety wire.  A ureteral access sheath was placed over the second wire and advanced up to the proximal ureter with fluoroscopic guidance.  The inner sheath and wire removed.  Flexible ureteroscopy took place.  The largest stone was seen in the renal pelvis.   The stone was then fragmented with the 200 micron holmium laser fiber.the largest stone fragments were removed with a ZeroTip basket.  Reinspection of the ureter revealed no remaining visible stones or fragments.  The ureteroscope was removed in unison with the access sheath taking care examine the ureter on the way out.  There is no trauma injuries to ureter.  The wire was then backloaded through the cystoscope and a ureteral stent was advance over the wire using Seldinger technique.  The stent was positioned appropriately under fluoroscopic and cystoscopic guidance.  The wire was then removed with an adequate stent curl noted in the renal pelvis as well as in the  bladder.  Attention then turned to the right side.  The right ureteral orifice was cannulated  with an open-ended ureteral catheter and a retrograde pyelogram was obtained.  A sensor wire was then advanced through the opening ureteral catheter up to the kidney fluoroscopic guidance.  The open-ended ureteral catheter was removed.  A second sensor wire was advanced alongside the for sensor wire and up to the kidney.  The cystoscope was removed.  1 wire was secured as a safety wire.  The ureteral access sheath was then placed over the second wire into the proximal ureter under fluoroscopic guidance.  The inner sheath and wire removed.  Flexible ureteroscopy took place.  The larger stone was seen in the renal pelvis.  This was fragmented with the same 200 m Moses fiber.  The larger stone fragments removed with ZeroTip basket.  All remaining fragments were less than 1 mm.  The ureteroscope was then removed in unison with the access sheath taking care to the ureter on the way.  The wire was then backloaded and the stent was placed in a similar manner to the left.  The bladder was then emptied and the procedure ended.  The patient appeared to tolerate the procedure well and without complications.  The patient was able to be awakened and transferred to the recovery unit in satisfactory condition.   Disposition: The tether of the stent was left on and tucked inside the patient's vagina.  Instructions for removing the stent have been provided to the patient.

## 2024-03-14 NOTE — Interval H&P Note (Signed)
 History and Physical Interval Note:  03/14/2024 9:47 AM  [name]  has presented today for surgery, with the diagnosis of LEFT URETERAL AND RENAL KIDNEY STONE.  The various methods of treatment have been discussed with the patient and family. After consideration of risks, benefits and other options for treatment, the patient has consented to  Procedures with comments: CYSTOSCOPY/URETEROSCOPY/HOLMIUM LASER/STENT PLACEMENT (Left) - CYSTOSCOPY/LEFT URETEROSCOPY/HOLMIUM LASER/STENT PLACEMENT as a surgical intervention.  The patient's history has been reviewed, patient examined, no change in status, stable for surgery.  I have reviewed the patient's chart and labs.  Questions were answered to the patient's satisfaction.     Troye Hiemstra D Jaretzy Lhommedieu

## 2024-03-14 NOTE — Discharge Instructions (Signed)
 DISCHARGE INSTRUCTIONS FOR KIDNEY STONE/URETERAL STENT   MEDICATIONS:  1. Resume all your other meds from home  2. AZO over the counter can help with the burning/stinging when you urinate. 3. Tramadol  is for moderate/severe pain, otherwise taking up to 1000 mg every 6 hours of plainTylenol will help treat your pain.   4. Take Cipro  one hour prior to removal of your stent.    ACTIVITY:  1. No strenuous activity x 1week  2. No driving while on narcotic pain medications  3. Drink plenty of water   4. Continue to walk at home - you can still get blood clots when you are at home, so keep active, but don't over do it.  5. May return to work/school tomorrow or when you feel ready   BATHING:  1. You can shower and we recommend daily showers  2. You have a string coming from your urethra: The stent string is attached to your ureteral stent. Do not pull on this.   SIGNS/SYMPTOMS TO CALL:  Please call us  if you have a fever greater than 101.5, uncontrolled nausea/vomiting, uncontrolled pain, dizziness, unable to urinate, bloody urine, chest pain, shortness of breath, leg swelling, leg pain, redness around wound, drainage from wound, or any other concerns or questions.   You can reach us  at [phone].   FOLLOW-UP:  1. You have a strings attached to your stents, you may remove them on Monday, December 29th. To do this, pull the strings until the stents are completely removed. You may feel an odd sensation in your back.

## 2024-03-15 ENCOUNTER — Encounter (HOSPITAL_COMMUNITY): Payer: Self-pay | Admitting: Urology

## 2024-03-18 ENCOUNTER — Encounter (HOSPITAL_COMMUNITY): Payer: Self-pay | Admitting: Internal Medicine

## 2024-03-18 ENCOUNTER — Emergency Department (HOSPITAL_COMMUNITY)

## 2024-03-18 ENCOUNTER — Inpatient Hospital Stay (HOSPITAL_COMMUNITY)
Admission: EM | Admit: 2024-03-18 | Discharge: 2024-03-20 | DRG: 690 | Disposition: A | Discharge status: Home / Self Care | Attending: Student | Admitting: Student

## 2024-03-18 DIAGNOSIS — E7800 Pure hypercholesterolemia, unspecified: Secondary | ICD-10-CM | POA: Diagnosis present

## 2024-03-18 DIAGNOSIS — N12 Tubulo-interstitial nephritis, not specified as acute or chronic: Principal | ICD-10-CM

## 2024-03-18 DIAGNOSIS — I10 Essential (primary) hypertension: Secondary | ICD-10-CM | POA: Diagnosis present

## 2024-03-18 DIAGNOSIS — N200 Calculus of kidney: Secondary | ICD-10-CM | POA: Diagnosis present

## 2024-03-18 DIAGNOSIS — E1165 Type 2 diabetes mellitus with hyperglycemia: Secondary | ICD-10-CM | POA: Diagnosis present

## 2024-03-18 DIAGNOSIS — Z7984 Long term (current) use of oral hypoglycemic drugs: Secondary | ICD-10-CM | POA: Diagnosis not present

## 2024-03-18 DIAGNOSIS — Z96651 Presence of right artificial knee joint: Secondary | ICD-10-CM | POA: Diagnosis present

## 2024-03-18 DIAGNOSIS — E876 Hypokalemia: Secondary | ICD-10-CM | POA: Diagnosis present

## 2024-03-18 DIAGNOSIS — E66811 Obesity, class 1: Secondary | ICD-10-CM | POA: Diagnosis present

## 2024-03-18 DIAGNOSIS — Z880 Allergy status to penicillin: Secondary | ICD-10-CM | POA: Diagnosis not present

## 2024-03-18 DIAGNOSIS — R109 Unspecified abdominal pain: Secondary | ICD-10-CM | POA: Diagnosis present

## 2024-03-18 DIAGNOSIS — Z7985 Long-term (current) use of injectable non-insulin antidiabetic drugs: Secondary | ICD-10-CM | POA: Diagnosis not present

## 2024-03-18 DIAGNOSIS — Z86718 Personal history of other venous thrombosis and embolism: Secondary | ICD-10-CM | POA: Diagnosis not present

## 2024-03-18 DIAGNOSIS — Z7951 Long term (current) use of inhaled steroids: Secondary | ICD-10-CM

## 2024-03-18 DIAGNOSIS — R10A1 Flank pain, right side: Secondary | ICD-10-CM

## 2024-03-18 DIAGNOSIS — D649 Anemia, unspecified: Secondary | ICD-10-CM | POA: Diagnosis present

## 2024-03-18 DIAGNOSIS — N139 Obstructive and reflux uropathy, unspecified: Secondary | ICD-10-CM | POA: Diagnosis not present

## 2024-03-18 DIAGNOSIS — E119 Type 2 diabetes mellitus without complications: Secondary | ICD-10-CM | POA: Diagnosis not present

## 2024-03-18 DIAGNOSIS — Z9049 Acquired absence of other specified parts of digestive tract: Secondary | ICD-10-CM

## 2024-03-18 DIAGNOSIS — F39 Unspecified mood [affective] disorder: Secondary | ICD-10-CM | POA: Diagnosis present

## 2024-03-18 DIAGNOSIS — Z79899 Other long term (current) drug therapy: Secondary | ICD-10-CM

## 2024-03-18 DIAGNOSIS — D6851 Activated protein C resistance: Secondary | ICD-10-CM | POA: Diagnosis present

## 2024-03-18 DIAGNOSIS — N10 Acute tubulo-interstitial nephritis: Secondary | ICD-10-CM | POA: Diagnosis present

## 2024-03-18 DIAGNOSIS — Z6833 Body mass index (BMI) 33.0-33.9, adult: Secondary | ICD-10-CM

## 2024-03-18 DIAGNOSIS — Z83438 Family history of other disorder of lipoprotein metabolism and other lipidemia: Secondary | ICD-10-CM

## 2024-03-18 DIAGNOSIS — Z8249 Family history of ischemic heart disease and other diseases of the circulatory system: Secondary | ICD-10-CM

## 2024-03-18 DIAGNOSIS — Z8349 Family history of other endocrine, nutritional and metabolic diseases: Secondary | ICD-10-CM

## 2024-03-18 DIAGNOSIS — I480 Paroxysmal atrial fibrillation: Secondary | ICD-10-CM | POA: Diagnosis present

## 2024-03-18 DIAGNOSIS — Z888 Allergy status to other drugs, medicaments and biological substances status: Secondary | ICD-10-CM

## 2024-03-18 DIAGNOSIS — R6510 Systemic inflammatory response syndrome (SIRS) of non-infectious origin without acute organ dysfunction: Secondary | ICD-10-CM | POA: Diagnosis not present

## 2024-03-18 DIAGNOSIS — J45909 Unspecified asthma, uncomplicated: Secondary | ICD-10-CM | POA: Diagnosis present

## 2024-03-18 DIAGNOSIS — N390 Urinary tract infection, site not specified: Principal | ICD-10-CM | POA: Diagnosis present

## 2024-03-18 DIAGNOSIS — N138 Other obstructive and reflux uropathy: Secondary | ICD-10-CM | POA: Diagnosis present

## 2024-03-18 DIAGNOSIS — Z833 Family history of diabetes mellitus: Secondary | ICD-10-CM

## 2024-03-18 DIAGNOSIS — Z860100 Personal history of colon polyps, unspecified: Secondary | ICD-10-CM

## 2024-03-18 DIAGNOSIS — R638 Other symptoms and signs concerning food and fluid intake: Secondary | ICD-10-CM

## 2024-03-18 DIAGNOSIS — T83123A Displacement of other urinary stents, initial encounter: Secondary | ICD-10-CM | POA: Diagnosis present

## 2024-03-18 DIAGNOSIS — Z91041 Radiographic dye allergy status: Secondary | ICD-10-CM

## 2024-03-18 DIAGNOSIS — Z91040 Latex allergy status: Secondary | ICD-10-CM

## 2024-03-18 DIAGNOSIS — Z7901 Long term (current) use of anticoagulants: Secondary | ICD-10-CM

## 2024-03-18 DIAGNOSIS — Z8269 Family history of other diseases of the musculoskeletal system and connective tissue: Secondary | ICD-10-CM

## 2024-03-18 DIAGNOSIS — Y732 Prosthetic and other implants, materials and accessory gastroenterology and urology devices associated with adverse incidents: Secondary | ICD-10-CM | POA: Diagnosis present

## 2024-03-18 DIAGNOSIS — K219 Gastro-esophageal reflux disease without esophagitis: Secondary | ICD-10-CM | POA: Diagnosis present

## 2024-03-18 DIAGNOSIS — Z823 Family history of stroke: Secondary | ICD-10-CM

## 2024-03-18 DIAGNOSIS — Z886 Allergy status to analgesic agent status: Secondary | ICD-10-CM

## 2024-03-18 DIAGNOSIS — Z882 Allergy status to sulfonamides status: Secondary | ICD-10-CM

## 2024-03-18 LAB — URINALYSIS, ROUTINE W REFLEX MICROSCOPIC
Bilirubin Urine: NEGATIVE
Glucose, UA: 150 mg/dL — AB
Ketones, ur: NEGATIVE mg/dL
Nitrite: NEGATIVE
Protein, ur: 100 mg/dL — AB
RBC / HPF: >50 RBC/hpf (ref 0–5)
Specific Gravity, Urine: 1.025 (ref 1.005–1.030)
WBC, UA: >50 WBC/hpf (ref 0–5)
pH: 5 (ref 5.0–8.0)

## 2024-03-18 LAB — I-STAT CG4 LACTIC ACID, ED: Lactic Acid, Venous: 1.7 mmol/L (ref 0.5–1.9)

## 2024-03-18 LAB — CBC WITH DIFFERENTIAL/PLATELET
Abs Immature Granulocytes: 0.04 K/uL (ref 0.00–0.07)
Basophils Absolute: 0.1 K/uL (ref 0.0–0.1)
Basophils Relative: 1 %
Eosinophils Absolute: 0.1 K/uL (ref 0.0–0.5)
Eosinophils Relative: 1 %
HCT: 42.4 % (ref 36.0–46.0)
Hemoglobin: 14.5 g/dL (ref 12.0–15.0)
Immature Granulocytes: 0 %
Lymphocytes Relative: 19 %
Lymphs Abs: 2.1 K/uL (ref 0.7–4.0)
MCH: 31 pg (ref 26.0–34.0)
MCHC: 34.2 g/dL (ref 30.0–36.0)
MCV: 90.6 fL (ref 80.0–100.0)
Monocytes Absolute: 1 K/uL (ref 0.1–1.0)
Monocytes Relative: 9 %
Neutro Abs: 7.7 K/uL (ref 1.7–7.7)
Neutrophils Relative %: 70 %
Platelets: 348 K/uL (ref 150–400)
RBC: 4.68 MIL/uL (ref 3.87–5.11)
RDW: 12.5 % (ref 11.5–15.5)
WBC: 10.9 K/uL — ABNORMAL HIGH (ref 4.0–10.5)
nRBC: 0 % (ref 0.0–0.2)

## 2024-03-18 LAB — PROTIME-INR
INR: 1.8 — ABNORMAL HIGH (ref 0.8–1.2)
Prothrombin Time: 21.8 s — ABNORMAL HIGH (ref 11.4–15.2)

## 2024-03-18 LAB — COMPREHENSIVE METABOLIC PANEL WITH GFR
ALT: 14 U/L (ref 0–44)
AST: 16 U/L (ref 15–41)
Albumin: 4.1 g/dL (ref 3.5–5.0)
Alkaline Phosphatase: 74 U/L (ref 38–126)
Anion gap: 14 (ref 5–15)
BUN: 15 mg/dL (ref 8–23)
CO2: 24 mmol/L (ref 22–32)
Calcium: 9.5 mg/dL (ref 8.9–10.3)
Chloride: 100 mmol/L (ref 98–111)
Creatinine, Ser: 0.79 mg/dL (ref 0.44–1.00)
GFR, Estimated: >60 mL/min
Glucose, Bld: 243 mg/dL — ABNORMAL HIGH (ref 70–99)
Potassium: 2.9 mmol/L — ABNORMAL LOW (ref 3.5–5.1)
Sodium: 137 mmol/L (ref 135–145)
Total Bilirubin: 0.4 mg/dL (ref 0.0–1.2)
Total Protein: 7.4 g/dL (ref 6.5–8.1)

## 2024-03-18 LAB — CBG MONITORING, ED: Glucose-Capillary: 143 mg/dL — ABNORMAL HIGH (ref 70–99)

## 2024-03-18 MED ORDER — CIPROFLOXACIN IN D5W 400 MG/200ML IV SOLN
400.0000 mg | Freq: Two times a day (BID) | INTRAVENOUS | Status: DC
  Administered 2024-03-18: 400 mg via INTRAVENOUS
  Filled 2024-03-18: qty 200

## 2024-03-18 MED ORDER — SODIUM CHLORIDE 0.9% FLUSH: 3.0000 mL | INTRAVENOUS | Status: DC | PRN

## 2024-03-18 MED ORDER — MORPHINE SULFATE (PF) 4 MG/ML IV SOLN
4.0000 mg | Freq: Once | INTRAVENOUS | Status: AC
  Administered 2024-03-18: 4 mg via INTRAVENOUS
  Filled 2024-03-18: qty 1

## 2024-03-18 MED ORDER — KETOROLAC TROMETHAMINE 15 MG/ML IJ SOLN
15.0000 mg | Freq: Once | INTRAMUSCULAR | Status: DC
  Filled 2024-03-18: qty 1

## 2024-03-18 MED ORDER — EZETIMIBE 10 MG PO TABS
10.0000 mg | ORAL_TABLET | Freq: Every day | ORAL | Status: DC
  Administered 2024-03-19: 10 mg via ORAL
  Filled 2024-03-18 (×2): qty 1

## 2024-03-18 MED ORDER — INSULIN ASPART 100 UNIT/ML IJ SOLN: 0.0000 [IU] | Freq: Three times a day (TID) | INTRAMUSCULAR | Status: DC

## 2024-03-18 MED ORDER — POLYETHYLENE GLYCOL 3350 17 G PO PACK
17.0000 g | PACK | Freq: Two times a day (BID) | ORAL | Status: DC | PRN
  Administered 2024-03-19: 17 g via ORAL
  Filled 2024-03-18: qty 1

## 2024-03-18 MED ORDER — SODIUM CHLORIDE 0.9% FLUSH
3.0000 mL | Freq: Two times a day (BID) | INTRAVENOUS | Status: DC
  Administered 2024-03-19 (×2): 3 mL via INTRAVENOUS

## 2024-03-18 MED ORDER — ONDANSETRON HCL 4 MG PO TABS: 4.0000 mg | ORAL_TABLET | Freq: Four times a day (QID) | ORAL | Status: DC | PRN

## 2024-03-18 MED ORDER — BUDESONIDE 0.5 MG/2ML IN SUSP
0.5000 mg | Freq: Two times a day (BID) | RESPIRATORY_TRACT | Status: DC
  Administered 2024-03-19: 0.5 mg via RESPIRATORY_TRACT
  Filled 2024-03-18 (×2): qty 2

## 2024-03-18 MED ORDER — ONDANSETRON HCL 4 MG/2ML IJ SOLN
4.0000 mg | Freq: Once | INTRAMUSCULAR | Status: AC
  Administered 2024-03-18: 4 mg via INTRAVENOUS
  Filled 2024-03-18: qty 2

## 2024-03-18 MED ORDER — PANTOPRAZOLE SODIUM 40 MG PO TBEC: 40.0000 mg | DELAYED_RELEASE_TABLET | Freq: Two times a day (BID) | ORAL | Status: DC

## 2024-03-18 MED ORDER — ONDANSETRON HCL 4 MG/2ML IJ SOLN
4.0000 mg | Freq: Four times a day (QID) | INTRAMUSCULAR | Status: DC | PRN
  Administered 2024-03-18: 4 mg via INTRAVENOUS
  Filled 2024-03-18 (×2): qty 2

## 2024-03-18 MED ORDER — FLUTICASONE PROPIONATE HFA 220 MCG/ACT IN AERO: 1.0000 | INHALATION_SPRAY | Freq: Two times a day (BID) | RESPIRATORY_TRACT | Status: DC

## 2024-03-18 MED ORDER — MORPHINE SULFATE (PF) 2 MG/ML IV SOLN: 1.0000 mg | INTRAVENOUS | Status: DC | PRN

## 2024-03-18 MED ORDER — ALBUTEROL SULFATE (2.5 MG/3ML) 0.083% IN NEBU: 2.5000 mg | INHALATION_SOLUTION | Freq: Four times a day (QID) | RESPIRATORY_TRACT | Status: DC | PRN

## 2024-03-18 MED ORDER — FLUTICASONE PROPIONATE 50 MCG/ACT NA SUSP
1.0000 | Freq: Two times a day (BID) | NASAL | Status: DC
  Administered 2024-03-19: 1 via NASAL
  Filled 2024-03-18 (×2): qty 16

## 2024-03-18 MED ORDER — WARFARIN - PHARMACIST DOSING INPATIENT: Freq: Every day | Status: DC

## 2024-03-18 MED ORDER — INSULIN ASPART 100 UNIT/ML IJ SOLN: 0.0000 [IU] | Freq: Every day | INTRAMUSCULAR | Status: DC

## 2024-03-18 MED ORDER — VENLAFAXINE HCL ER 37.5 MG PO CP24
37.5000 mg | ORAL_CAPSULE | Freq: Every day | ORAL | Status: DC
  Administered 2024-03-19: 37.5 mg via ORAL
  Filled 2024-03-18: qty 1

## 2024-03-18 MED ORDER — VERAPAMIL HCL 40 MG PO TABS
160.0000 mg | ORAL_TABLET | Freq: Once | ORAL | Status: AC
  Administered 2024-03-18: 160 mg via ORAL
  Filled 2024-03-18: qty 1

## 2024-03-18 MED ORDER — LOSARTAN POTASSIUM 25 MG PO TABS
25.0000 mg | ORAL_TABLET | Freq: Every day | ORAL | Status: DC
  Administered 2024-03-19: 25 mg via ORAL
  Filled 2024-03-18 (×2): qty 1

## 2024-03-18 MED ORDER — WARFARIN SODIUM 4 MG PO TABS
4.0000 mg | ORAL_TABLET | Freq: Once | ORAL | Status: AC
  Administered 2024-03-18: 4 mg via ORAL
  Filled 2024-03-18: qty 1

## 2024-03-18 MED ORDER — ACETAMINOPHEN 325 MG PO TABS
650.0000 mg | ORAL_TABLET | Freq: Four times a day (QID) | ORAL | Status: DC | PRN
  Administered 2024-03-20: 650 mg via ORAL

## 2024-03-18 MED ORDER — LACTATED RINGERS IV BOLUS
1000.0000 mL | Freq: Once | INTRAVENOUS | Status: AC
  Administered 2024-03-18: 1000 mL via INTRAVENOUS

## 2024-03-18 MED ORDER — MECLIZINE HCL 25 MG PO TABS: 25.0000 mg | ORAL_TABLET | Freq: Three times a day (TID) | ORAL | Status: DC | PRN

## 2024-03-18 MED ORDER — MORPHINE SULFATE (PF) 2 MG/ML IV SOLN
1.0000 mg | INTRAVENOUS | Status: DC | PRN
  Administered 2024-03-18 – 2024-03-19 (×7): 2 mg via INTRAVENOUS
  Filled 2024-03-18 (×7): qty 1

## 2024-03-18 MED ORDER — SODIUM CHLORIDE 0.9 % IV SOLN: 250.0000 mL | INTRAVENOUS | Status: AC | PRN

## 2024-03-18 MED ORDER — TRAMADOL HCL 50 MG PO TABS
50.0000 mg | ORAL_TABLET | Freq: Four times a day (QID) | ORAL | Status: DC | PRN
  Administered 2024-03-18 – 2024-03-20 (×3): 50 mg via ORAL
  Filled 2024-03-18 (×2): qty 1

## 2024-03-18 MED ORDER — ACETAMINOPHEN 10 MG/ML IV SOLN
1000.0000 mg | Freq: Once | INTRAVENOUS | Status: AC
  Administered 2024-03-18: 1000 mg via INTRAVENOUS
  Filled 2024-03-18: qty 100

## 2024-03-18 MED ORDER — ACETAMINOPHEN 650 MG RE SUPP: 650.0000 mg | Freq: Four times a day (QID) | RECTAL | Status: DC | PRN

## 2024-03-18 MED ORDER — CIPROFLOXACIN IN D5W 400 MG/200ML IV SOLN
400.0000 mg | Freq: Two times a day (BID) | INTRAVENOUS | Status: DC
  Administered 2024-03-19 – 2024-03-20 (×3): 400 mg via INTRAVENOUS
  Filled 2024-03-18 (×2): qty 200

## 2024-03-18 MED ORDER — POTASSIUM CHLORIDE 10 MEQ/100ML IV SOLN
10.0000 meq | INTRAVENOUS | Status: AC
  Administered 2024-03-18 – 2024-03-19 (×4): 10 meq via INTRAVENOUS
  Filled 2024-03-18 (×4): qty 100

## 2024-03-18 MED ORDER — VERAPAMIL HCL 40 MG PO TABS: 160.0000 mg | ORAL_TABLET | Freq: Two times a day (BID) | ORAL | Status: DC

## 2024-03-18 MED ORDER — PANTOPRAZOLE SODIUM 40 MG PO TBEC
40.0000 mg | DELAYED_RELEASE_TABLET | Freq: Two times a day (BID) | ORAL | Status: DC
  Administered 2024-03-18 – 2024-03-19 (×3): 40 mg via ORAL
  Filled 2024-03-18 (×3): qty 1

## 2024-03-18 NOTE — ED Provider Notes (Signed)
 " [place] EMERGENCY DEPARTMENT AT [facility] Provider Note   CSN: [id number] Arrival date & time: 03/18/24  1126     History {Add pertinent medical, surgical, social history, OB history to HPI:1} Chief Complaint  Patient presents with   right flank pain    AEMILIA DEDRICK is a 68 y.o. female with PMH as listed below who presents with flank pain. Recent h/o bilateral ureterolithiasis. On 03/14/24, patient had Cystoscopy, bilateral ureteroscopy, laser lithotripsy, basket stone extraction, bilateral 79F x 24cm ureteral stent placement - with string, and bilateral retrograde pyelography with Dr. Elisabeth. It's noted in op note that all remaining stone fragments were <1 mm. Patient states her right stent fell out on 12/25, and her left one fell out last night. Has had only a little bit of urine output since last night.  Admits to some dysuria and right flank pain.  Unknown whether or not she has had a fever because she has been taking Tylenol  around the clock.  +nausea/vomiting. Was on ciprofloxacin  for 15 days prior to OR and had a dose day of surgery and day after.  Not currently on any antibiotics.    Past Medical History:  Diagnosis Date   Allergic rhinitis    Anginal pain    Arthritis    back (05/07/2016)   Asthma    Atrial flutter (HCC)    a. Remotely per notes.   Carpal tunnel syndrome, bilateral    Colon polyps    Diabetes mellitus without complication (HCC)    Diverticulitis    Dizziness    DVT (deep venous thrombosis) (HCC) 1980s x2 -    between birth of two sons. Saw hematology - was told she has a hypercoagulable disorder, should be on Coumadin  lifelong.   Dysrhythmia    atrial fib   Factor V Leiden    Family history of adverse reaction to anesthesia    mother gets PONV   Gallbladder disease    GERD (gastroesophageal reflux disease)    GIB (gastrointestinal bleeding) 04/14/2018   Halothane adverse reaction    narrow small opening   Heart murmur     Hiatal hernia    History of hiatal hernia    small one/CTA 05/01/2016 (05/07/2016)   History of kidney stones    HTN (hypertension)    Hypercholesteremia    hx; brought it down w/diet (05/07/2016)   Hyperglycemia    a. A1c 6.1 in 2014.   Inguinal hernia    Iron  deficiency anemia due to chronic blood loss 04/14/2018   Iron  malabsorption 04/14/2018   Left sided sciatica    Normal cardiac stress test 06/2012   Obesity    OSA (obstructive sleep apnea)    a. Mild, did not tolerate CPAP (05/07/2016)   PAF (paroxysmal atrial fibrillation) (HCC)    a. s/p afib ablation at [facility] 2010 (Dr. Arlon). b. Recurrent AF s/p DCCV 06/2010. c. On flecainide .    Paroxysmal atrial fibrillation (HCC) 09/09/2009   Qualifier: Diagnosis of  By: Lawernce CMA, Jewel     PONV (postoperative nausea and vomiting)    PPD positive, treated 1977   treated for 1 yr after exposure to patient   RSV infection ~ 2006   Vertigo    Wandering (atrial) pacemaker    a. Remotely per notes.  (  pt. states has a wandering p wave ) no pacemaker       Home Medications Prior to Admission medications  Medication Sig Start Date End  Date Taking? Authorizing Provider  acetaminophen  (TYLENOL ) 650 MG CR tablet Take 1,300 mg by mouth in the morning and at bedtime.    [provider]  APPLE CIDER VINEGAR PO Take 450 mg by mouth in the morning.    [provider]  cetirizine (ZYRTEC) 10 MG tablet Take 10 mg by mouth in the morning.    [provider]  Cholecalciferol  (VITAMIN D3) 50 MCG (2000 UT) capsule Take 2,000 Units by mouth in the morning.    [provider]  Continuous Blood Gluc Sensor (FREESTYLE LIBRE 2 SENSOR) MISC APPLY ONE SENSOR TO THE BACK OF THE UPPER ARM EVERY 14 DAYS    [provider]  dicyclomine  (BENTYL ) 10 MG capsule TAKE 1 CAPSULE BY MOUTH 30 MINS BEFORE MEALS AND AT BEDTIME AS NEEDED FOR PAIN/CRAMPING 12/23/18   Zehr, Jessica D, PA-C  enoxaparin  (LOVENOX ) 120  MG/0.8ML injection Inject 0.8 mLs (120 mg total) into the skin daily. 03/07/24   Lonni Slain, MD  ezetimibe  (ZETIA ) 10 MG tablet TAKE 1 TABLET BY MOUTH EVERY DAY 07/26/23   Hilty, Vinie BROCKS, MD  FLOVENT  HFA 220 MCG/ACT inhaler Inhale 1 puff into the lungs 2 (two) times daily. 04/20/16   [provider]  fluticasone  (FLONASE ) 50 MCG/ACT nasal spray Place 1 spray into both nostrils 2 (two) times daily.  06/02/12   [provider]  losartan  (COZAAR ) 25 MG tablet Take 1 tablet (25 mg total) by mouth daily. 12/20/13   Allred, Lynwood, MD  meclizine  (ANTIVERT ) 25 MG tablet Take 1 tablet (25 mg total) by mouth 3 (three) times daily as needed for dizziness. 01/19/18   Randol Simmonds, MD  Multiple Vitamin (MULTIVITAMIN WITH MINERALS) TABS tablet Take 1 tablet by mouth in the morning.    [provider]  nitroGLYCERIN  (NITROSTAT ) 0.4 MG SL tablet PLACE 1 TABLET UNDER THE TONGUE EVERY 5 MINUTES AS NEEDED. 05/22/21   Leverne Charlies Helling, PA-C  omeprazole  (PRILOSEC) 40 MG capsule TAKE 1 CAPSULE BY MOUTH DAILY, CAN INCREASE TO 2 TIMES DAILY IF NEEDED Patient taking differently: Take 40 mg by mouth in the morning and at bedtime. 01/14/23   Kerman Vina HERO, NP  OZEMPIC, 1 MG/DOSE, 4 MG/3ML SOPN Inject 1 mg into the skin every Monday. 09/08/22   [provider]  polyethylene glycol (MIRALAX ) 17 g packet Take 17 g by mouth 2 (two) times daily. Titrate as needed Patient taking differently: Take 17 g by mouth 2 (two) times daily as needed (constipation.). 01/23/21   Armbruster, Elspeth SQUIBB, MD  PROAIR  HFA 108 (90 BASE) MCG/ACT inhaler Inhale 2 puffs into the lungs every 6 (six) hours as needed for wheezing or shortness of breath.  03/22/12   [provider]  scopolamine  (TRANSDERM-SCOP) 1 MG/3DAYS Place 1 patch onto the skin as needed. 05/04/23   [provider]  traMADol  (ULTRAM ) 50 MG tablet Take 1 tablet (50 mg total) by mouth every 6 (six) hours as needed. 03/14/24  03/14/25  Pace, Maryellen D, MD  venlafaxine  XR (EFFEXOR -XR) 37.5 MG 24 hr capsule Take 37.5 mg by mouth daily with breakfast.  11/21/12   [provider]  verapamil  (CALAN ) 80 MG tablet Take 160 mg by mouth 2 (two) times daily.    [provider]  warfarin (COUMADIN ) 2 MG tablet TAKE 4MG  DAILY EXCEPT 6MG  ON TUESDAYS, THURSDAYS,AND SATURDAYS AS DIRECTED BY ANTICOAGULATION CLINIC 02/10/24   Lonni Slain, MD      Allergies    Aspirin, Banana, Food, Iodinated  contrast media, Latex, Meperidine  hcl, Peanut-containing drug products, Penicillins, Statins, Sulfonamide derivatives, Tape, Alfuzosin, Halothane, Hydrocodone , Other, and Sulfamethoxazole    Review of Systems   Review of Systems A 10 point review of systems was performed and is negative unless otherwise reported in HPI.  Physical Exam Updated Vital Signs BP (!) 180/93 (BP Location: Left Arm) Comment: Pt reports this is normal bp when in pain  Pulse 91   Temp 99.6 F (37.6 C) (Oral)   Resp 16   Ht 5' 4 (1.626 m)   Wt 87.5 kg   SpO2 96%   BMI 33.13 kg/m  Physical Exam General: Normal appearing {Desc; female/female:11659}, lying in bed.  HEENT: PERRLA, Sclera anicteric, MMM, trachea midline.  Cardiology: RRR, no murmurs/rubs/gallops. BL radial and DP pulses equal bilaterally.  Resp: Normal respiratory rate and effort. CTAB, no wheezes, rhonchi, crackles.  Abd: Soft, non-tender, non-distended. No rebound tenderness or guarding.  GU: Deferred. MSK: No peripheral edema or signs of trauma. Extremities without deformity or TTP. No cyanosis or clubbing. Skin: warm, dry. No rashes or lesions. Back: No CVA tenderness Neuro: A&Ox4, CNs II-XII grossly intact. MAEs. Sensation grossly intact.  Psych: Normal mood and affect.   ED Results / Procedures / Treatments   Labs (all labs ordered are listed, but only abnormal results are displayed) Labs Reviewed  CBC WITH DIFFERENTIAL/PLATELET - Abnormal; Notable for the  following components:      Result Value   WBC 10.9 (*)    All other components within normal limits  COMPREHENSIVE METABOLIC PANEL WITH GFR - Abnormal; Notable for the following components:   Potassium 2.9 (*)    Glucose, Bld 243 (*)    All other components within normal limits  URINALYSIS, ROUTINE W REFLEX MICROSCOPIC - Abnormal; Notable for the following components:   APPearance CLOUDY (*)    Glucose, UA 150 (*)    Hgb urine dipstick LARGE (*)    Protein, ur 100 (*)    Leukocytes,Ua SMALL (*)    Bacteria, UA RARE (*)    Non Squamous Epithelial 0-5 (*)    All other components within normal limits  CULTURE, BLOOD (ROUTINE X 2)  CULTURE, BLOOD (ROUTINE X 2)  URINE CULTURE  I-STAT CG4 LACTIC ACID, ED  I-STAT CG4 LACTIC ACID, ED    EKG None  Radiology CT Renal Stone Study Result Date: 03/18/2024 EXAM: CT UROGRAM 03/18/2024 12:28:00 PM TECHNIQUE: CT of the abdomen and pelvis was performed without the administration of intravenous contrast as per CT urogram protocol. Multiplanar reformatted images as well as MIP urogram images are provided for review. Automated exposure control, iterative reconstruction, and/or weight based adjustment of the mA/kV was utilized to reduce the radiation dose to as low as reasonably achievable. COMPARISON: CT renal 09:30 2022021 CLINICAL HISTORY: Abdominal/flank pain, stone suspected. FINDINGS: LOWER CHEST: Foci of gas along the left costophrenic angle, likely due to pulmonary parenchyma with adjacent atelectasis (2.14). LIVER: The liver is unremarkable. GALLBLADDER AND BILE DUCTS: Postcholecystectomy. No biliary ductal dilatation. SPLEEN: No acute abnormality. PANCREAS: No acute abnormality. ADRENAL GLANDS: No acute abnormality. KIDNEYS, URETERS AND BLADDER: Calcified stones within the bilateral kidneys measuring 5 mm on the right and 3 mm on the left. There is also a 9 mm calcified stone within the left renal pelvis with associated mild fullness of the  collecting system and associated urothelial thickening. No ureterolithiasis bilaterally. No hydroureter. No right hydronephrosis. Mild fullness of the left collecting system associated with the left renal pelvis stone. No  perinephric or periureteral stranding. Urinary bladder is unremarkable. GI AND BOWEL: Sigmoid resection. No small or large bowel thickening or dilatation. The appendix is unremarkable. Tiny hiatal hernia. Stomach demonstrates no acute abnormality. There is no bowel obstruction. PERITONEUM AND RETROPERITONEUM: No ascites. No free air. VASCULATURE: Aorta is normal in caliber. LYMPH NODES: No lymphadenopathy. REPRODUCTIVE ORGANS: The uterus is unremarkable. Bilateral adnexal regions are unremarkable with no mass. BONES AND SOFT TISSUES: No acute osseous abnormality. Persistent soft tissue densities within the anterior abdominal wall soft tissues are likely related to medication injection. IMPRESSION: 1. Minimally obstructive 9 mm calcified stone within the left renal pelvis with associated urothelial thickening concerning for superimposed infection - correlate with urinalysis. 2. Nonobstructive bilateral nephrolithiasis. 3. Cholecystectomy and sigmoid resection. 4. Other, non-acute and/or normal findings as above. Electronically signed by: Morgane Naveau MD 03/18/2024 01:41 PM EST RP Workstation: [id number]    Procedures Procedures  {Document cardiac monitor, telemetry assessment procedure when appropriate:1}  Medications Ordered in ED Medications  ciprofloxacin  (CIPRO ) IVPB 400 mg (has no administration in time range)  morphine  (PF) 4 MG/ML injection 4 mg (has no administration in time range)  ondansetron  (ZOFRAN ) injection 4 mg (has no administration in time range)  lactated ringers  bolus 1,000 mL (1,000 mLs Intravenous New Bag/Given 03/18/24 1436)  morphine  (PF) 4 MG/ML injection 4 mg (4 mg Intravenous Given 03/18/24 1435)    ED Course/ Medical Decision Making/ A&P                           Medical Decision Making Amount and/or Complexity of Data Reviewed Labs:  Decision-making details documented in ED Course.  Risk Prescription drug management.    This patient presents to the ED for concern of decreased urinary output and right flank pain, this involves an extensive number of treatment options, and is a complaint that carries with it a high risk of complications and morbidity.  I considered the following differential and admission for this acute, potentially life threatening condition.   MDM:    ***  Clinical Course as of 03/18/24 1619  Sat Mar 18, 2024  1549 Potassium(!): 2.9 +hypokalemia, will replete [HN]  1549 WBC(!): 10.9 +leukocytosis, low grade fever 99.68F [HN]  1549 Urinalysis, Routine w reflex microscopic -Urine, Clean Catch(!) +will get culture and give IV ciproflocaxin d/t allergy to penicillin of anaphylaxis. Will call urology. [HN]    Clinical Course User Index [HN] Franklyn Sid SAILOR, MD    Labs: I Ordered, and personally interpreted labs.  The pertinent results include: Those listed above  Imaging Studies ordered: CT renal stone study ordered from triage I independently visualized and interpreted imaging. I agree with the radiologist interpretation  Additional history obtained from chart review.  External records from outside source obtained and reviewed including ***  Cardiac Monitoring: The patient was maintained on a cardiac monitor.  I personally viewed and interpreted the cardiac monitored which showed an underlying rhythm of: ***  Reevaluation: After the interventions noted above, I reevaluated the patient and found that they have :{resolved/improved/worsened:23923::improved}  Social Determinants of Health: Lives independently  Disposition:  ***  Co morbidities that complicate the patient evaluation  Past Medical History:  Diagnosis Date   Allergic rhinitis    Anginal pain    Arthritis    back (05/07/2016)   Asthma     Atrial flutter (HCC)    a. Remotely per notes.   Carpal tunnel syndrome, bilateral    Colon polyps  Diabetes mellitus without complication (HCC)    Diverticulitis    Dizziness    DVT (deep venous thrombosis) (HCC) 1980s x2 -    between birth of two sons. Saw hematology - was told she has a hypercoagulable disorder, should be on Coumadin  lifelong.   Dysrhythmia    atrial fib   Factor V Leiden    Family history of adverse reaction to anesthesia    mother gets PONV   Gallbladder disease    GERD (gastroesophageal reflux disease)    GIB (gastrointestinal bleeding) 04/14/2018   Halothane adverse reaction    narrow small opening   Heart murmur    Hiatal hernia    History of hiatal hernia    small one/CTA 05/01/2016 (05/07/2016)   History of kidney stones    HTN (hypertension)    Hypercholesteremia    hx; brought it down w/diet (05/07/2016)   Hyperglycemia    a. A1c 6.1 in 2014.   Inguinal hernia    Iron  deficiency anemia due to chronic blood loss 04/14/2018   Iron  malabsorption 04/14/2018   Left sided sciatica    Normal cardiac stress test 06/2012   Obesity    OSA (obstructive sleep apnea)    a. Mild, did not tolerate CPAP (05/07/2016)   PAF (paroxysmal atrial fibrillation) (HCC)    a. s/p afib ablation at [facility] 2010 (Dr. Arlon). b. Recurrent AF s/p DCCV 06/2010. c. On flecainide .    Paroxysmal atrial fibrillation (HCC) 09/09/2009   Qualifier: Diagnosis of  By: Lawernce CMA, Jewel     PONV (postoperative nausea and vomiting)    PPD positive, treated 1977   treated for 1 yr after exposure to patient   RSV infection ~ 2006   Vertigo    Wandering (atrial) pacemaker    a. Remotely per notes.  (  pt. states has a wandering p wave ) no pacemaker     Medicines Meds ordered this encounter  Medications   lactated ringers  bolus 1,000 mL   morphine  (PF) 4 MG/ML injection 4 mg   ciprofloxacin  (CIPRO ) IVPB 400 mg    Antibiotic Indication::   Intra-abdominal  Infection   morphine  (PF) 4 MG/ML injection 4 mg   ondansetron  (ZOFRAN ) injection 4 mg    I have reviewed the patients home medicines and have made adjustments as needed  Problem List / ED Course: Problem List Items Addressed This Visit   None        {Document critical care time when appropriate:1} {Document review of labs and clinical decision tools ie heart score, Chads2Vasc2 etc:1}  {Document your independent review of radiology images, and any outside records:1} {Document your discussion with family members, caretakers, and with consultants:1} {Document social determinants of health affecting pt's care:1} {Document your decision making why or why not admission, treatments were needed:1}  This note was created using dictation software, which may contain spelling or grammatical errors.  "

## 2024-03-18 NOTE — ED Provider Triage Note (Signed)
 Emergency Medicine Provider Triage Evaluation Note  [name] , a 68 y.o. female  was evaluated in triage.  Pt complains of right flank pain. Had a cystoscopy, lithotripsy, and bilateral stent placement on 12/23.  Notes 1 stent fell out on 12/25 and 1 fell out last night.  Has had only a little bit of urine output since last night.  Admits to some dysuria and right flank pain.  Unknown whether or not she has had a fever because she has been taking Tylenol  around the clock.  Was on ciprofloxacin  for 15 days prior to OR and had a dose day of surgery and day after.  Not currently on any antibiotics.  Review of Systems  Positive: Flank pain, dysuria Negative: CP  Physical Exam  BP (!) 180/93 (BP Location: Left Arm) Comment: Pt reports this is normal bp when in pain  Pulse 91   Temp 99.6 F (37.6 C) (Oral)   Resp 16   Ht 5' 4 (1.626 m)   Wt 87.5 kg   SpO2 96%   BMI 33.13 kg/m  Gen:   Awake, no distress   Resp:  Normal effort  MSK:   Moves extremities without difficulty  Other:  +right CVA tenderness  Medical Decision Making  Medically screening exam initiated at 12:02 PM.  Appropriate orders placed.  [name] was informed that the remainder of the evaluation will be completed by another provider, this initial triage assessment does not replace that evaluation, and the importance of remaining in the ED until their evaluation is complete.  Labs, borderline febrile added blood cultures CT renal study UA   Kelly Aleck BROCKS, PA-C 03/18/24 [id number]

## 2024-03-18 NOTE — ED Triage Notes (Addendum)
 Patient c/o right flank/kidney pain x 3 days Nausea Had bilateral kidney stones removed 03/14/24 Stent came out early  Pain rated 10/10  Also reports she hasn't urinated since sometime during the night

## 2024-03-18 NOTE — H&P (Signed)
 " History and Physical    [name] FMW:[id number] DOB: 01-Aug-1955 DOA: 03/18/2024  PCP: Seabron Lenis, MD   Patient coming from: Home   Chief Complaint:  Chief Complaint  Patient presents with   right flank pain   ED TRIAGE note:Patient c/o right flank/kidney pain x 3 days Nausea Had bilateral kidney stones removed 03/14/24 Stent came out early  Pain rated 10/10  Also reports she hasn't urinated since sometime during the night   HPI:  [name] is a 68 y.o. female with medical history significant of nephrolithiasis s/p bilateral stent placement with stone removal, chronic anemia, hyperlipidemia, DM type II complicated UTI, essential hypertension, paroxysmal atrial fibrillation on warfarin, chronic vertigo, GERD, mood disorder and history of DVT presented to emergency department with complaining of right-sided flank pain, nausea, vomiting, dysuria and right the stent fell out on 12/25 last night.  Patient was on ciprofloxacin  for 15 days prior to the OR and had a dose on the day of the surgery and day after.  Not currently on any antibiotic.  Patient complaining with subjective fever.  Denies any chill, chest pain, palpitation shortness of breath, hematuria and no other complaint at this time.  Per chart review patient underwent cystoscopy 03/14/2024 with bilateral uteroscopy, laser lithotripsy with basket stone extraction with bilateral ureteral stent placement with a string.  Urine culture from 12/1 showed 30,000 colony of group A strep agalactiae.  Per chart review previous hospital admission on 12/3 for complicated UTI initially treated with iv antibiotic and discharged home with oral Levaquin  for 5 days course.  ED Course: At presentation to ED patient is afebrile and blood pressure elevated 180/93. Lab work, CBC showing leukocytosis 10.9 otherwise unremarkable. CMP showing low potassium 2.9 elevated blood glucose 243. Normal lactic acid level.  UA showing evidence  of UTI.  Pending urine culture.  CT renal stone study: 1. Minimally obstructive 9 mm calcified stone within the left renal pelvis with associated urothelial thickening concerning for superimposed infection - correlate with urinalysis. 2. Nonobstructive bilateral nephrolithiasis. 3. Cholecystectomy and sigmoid resection. 4. Other, non-acute and/or normal findings as above.   In the ED patient received multiple doses of morphine , Toradol , ciprofloxacin .  EDP  discussed case with urology.  Per urology standpoint no intervention required at time from urology standpoint and patient can be discharged from emergency department however as patient meets SIRS criteria and continues to have significant flank pain and history of previous complicated UTI hospitalist consulted  for further evaluation management of obstructive uropathy of the left renal pelvis with acute cystitis/complicated recurrent UTI, hypokalemia.   Significant labs in the ED: Lab Orders         Blood culture (routine x 2)         Urine Culture         CBC with Differential         Comprehensive metabolic panel         Urinalysis, Routine w reflex microscopic -Urine, Clean Catch         Comprehensive metabolic panel         CBC         Protime-INR         I-Stat CG4 Lactic Acid       Review of Systems:  Review of Systems  Constitutional:  Positive for fever. Negative for malaise/fatigue and weight loss.  Respiratory:  Negative for cough, sputum production and shortness of breath.   Cardiovascular:  Negative for chest  pain and palpitations.  Gastrointestinal:  Positive for nausea and vomiting. Negative for abdominal pain, blood in stool and constipation.  Genitourinary:  Positive for dysuria and flank pain. Negative for frequency, hematuria and urgency.  Neurological:  Negative for dizziness and headaches.  Psychiatric/Behavioral:  The patient is not nervous/anxious.     Past Medical History:  Diagnosis Date   Allergic  rhinitis    Anginal pain    Arthritis    back (05/07/2016)   Asthma    Atrial flutter (HCC)    a. Remotely per notes.   Carpal tunnel syndrome, bilateral    Colon polyps    Diabetes mellitus without complication (HCC)    Diverticulitis    Dizziness    DVT (deep venous thrombosis) (HCC) 1980s x2 -    between birth of two sons. Saw hematology - was told she has a hypercoagulable disorder, should be on Coumadin  lifelong.   Dysrhythmia    atrial fib   Factor V Leiden    Family history of adverse reaction to anesthesia    mother gets PONV   Gallbladder disease    GERD (gastroesophageal reflux disease)    GIB (gastrointestinal bleeding) 04/14/2018   Halothane adverse reaction    narrow small opening   Heart murmur    Hiatal hernia    History of hiatal hernia    small one/CTA 05/01/2016 (05/07/2016)   History of kidney stones    HTN (hypertension)    Hypercholesteremia    hx; brought it down w/diet (05/07/2016)   Hyperglycemia    a. A1c 6.1 in 2014.   Inguinal hernia    Iron  deficiency anemia due to chronic blood loss 04/14/2018   Iron  malabsorption 04/14/2018   Left sided sciatica    Normal cardiac stress test 06/2012   Obesity    OSA (obstructive sleep apnea)    a. Mild, did not tolerate CPAP (05/07/2016)   PAF (paroxysmal atrial fibrillation) (HCC)    a. s/p afib ablation at [facility] 2010 (Dr. Arlon). b. Recurrent AF s/p DCCV 06/2010. c. On flecainide .    Paroxysmal atrial fibrillation (HCC) 09/09/2009   Qualifier: Diagnosis of  By: Lawernce CMA, Jewel     PONV (postoperative nausea and vomiting)    PPD positive, treated 1977   treated for 1 yr after exposure to patient   RSV infection ~ 2006   Vertigo    Wandering (atrial) pacemaker    a. Remotely per notes.  (  pt. states has a wandering p wave ) no pacemaker    Past Surgical History:  Procedure Laterality Date   ANKLE ARTHODESIS W/ ARTHROSCOPY Right 11/12/2020   ATRIAL ABLATION SURGERY  05/07/2016    fib and flutter   ATRIAL FIBRILLATION ABLATION  2010   at [facility]   ATRIAL FIBRILLATION ABLATION N/A 05/07/2016   Procedure: Atrial Fibrillation Ablation;  Surgeon: Lynwood Rakers, MD;  Location: [facility] INVASIVE CV LAB;  Service: Cardiovascular;  Laterality: N/A;   ATRIAL FIBRILLATION ABLATION N/A 12/18/2022   Procedure: ATRIAL FIBRILLATION ABLATION;  Surgeon: Cindie Ole DASEN, MD;  Location: [facility] INVASIVE CV LAB;  Service: Cardiovascular;  Laterality: N/A;   bilateral cataract surgery      BREAST CYST ASPIRATION Bilateral    CARDIAC CATHETERIZATION  2008   CARPAL TUNNEL RELEASE Bilateral    CATARACT EXTRACTION Bilateral 2022   CESAREAN SECTION  1986; 1988   COLON RESECTION     COLONOSCOPY  X 2   COLONOSCOPY W/ BIOPSIES AND POLYPECTOMY  X 2   CYSTOSCOPY/URETEROSCOPY/HOLMIUM LASER/STENT PLACEMENT Left 12/23/2019   Procedure: CYSTOSCOPY/URETEROSCOPY/HOLMIUM LASER/STENT PLACEMENT;  Surgeon: Elisabeth Valli BIRCH, MD;  Location: [facility] ORS;  Service: Urology;  Laterality: Left;   CYSTOSCOPY/URETEROSCOPY/HOLMIUM LASER/STENT PLACEMENT Left 03/14/2024   Procedure: CYSTOSCOPY/URETEROSCOPY/HOLMIUM LASER/STENT PLACEMENT;  Surgeon: Elisabeth Valli BIRCH, MD;  Location: [facility] ORS;  Service: Urology;  Laterality: Left;  CYSTOSCOPY/LEFT URETEROSCOPY/HOLMIUM LASER/STENT PLACEMENT   ESOPHAGOGASTRODUODENOSCOPY     ESOPHAGOGASTRODUODENOSCOPY (EGD) WITH PROPOFOL  N/A 07/19/2017   Procedure: ESOPHAGOGASTRODUODENOSCOPY (EGD) WITH PROPOFOL ;  Surgeon: Leigh Elspeth SQUIBB, MD;  Location: [facility] ENDOSCOPY;  Service: Gastroenterology;  Laterality: N/A;   INGUINAL HERNIA REPAIR Right    KNEE ARTHROSCOPY Right 06/10/2021   Procedure: RIGHT KNEE ARTHROSCOPY;  Surgeon: Sheril Coy, MD;  Location: [facility] ORS;  Service: Orthopedics;  Laterality: Right;   LAPAROSCOPIC CHOLECYSTECTOMY  1989   POLYPECTOMY N/A 07/19/2017   Procedure: POLYPECTOMY;  Surgeon: Leigh Elspeth SQUIBB, MD;  Location: [facility] ENDOSCOPY;  Service: Gastroenterology;   Laterality: N/A;   PROCTOSCOPY N/A 08/06/2017   Procedure: PROCTOSCOPY;  Surgeon: Sheldon Elspeth, MD;  Location: [facility] ORS;  Service: General;  Laterality: N/A;   TMJ ARTHROPLASTY     TOTAL KNEE ARTHROPLASTY Right 01/06/2022   Procedure: RIGHT TOTAL KNEE ARTHROPLASTY;  Surgeon: Sheril Coy, MD;  Location: [facility];  Service: Orthopedics;  Laterality: Right;   trigger finger release on right      TUBAL LIGATION  1988     reports that she has never smoked. She has been exposed to tobacco smoke. She has never used smokeless tobacco. She reports that she does not currently use alcohol. She reports that she does not use drugs.  Allergies[1]  Family History  Problem Relation Age of Onset   Hypertension Mother    Stroke Mother    Heart attack Mother        2 previous MIs, 3 stents - CAD first diagnosed 6   Thyroid  disease Mother    Hyperlipidemia Father    Colon polyps Father    Lupus Sister    Thyroid  disease Sister        x 2   Hyperlipidemia Maternal Grandmother    Heart attack Maternal Grandmother    Diabetes Maternal Grandmother    Hypertension Maternal Grandmother    Hypertension Maternal Grandfather    Stroke Maternal Grandfather        Multiple family members   Stroke Paternal Grandmother    Heart attack Paternal Grandfather        Died at 41 of massive MI   Colon cancer Neg Hx    Stomach cancer Neg Hx     Prior to Admission medications  Medication Sig Start Date End Date Taking? Authorizing Provider  acetaminophen  (TYLENOL ) 650 MG CR tablet Take 1,300 mg by mouth in the morning and at bedtime.    [provider]  APPLE CIDER VINEGAR PO Take 450 mg by mouth in the morning.    [provider]  cetirizine (ZYRTEC) 10 MG tablet Take 10 mg by mouth in the morning.    [provider]  Cholecalciferol  (VITAMIN D3) 50 MCG (2000 UT) capsule Take 2,000 Units by mouth in the morning.    [provider]  Continuous Blood Gluc Sensor (FREESTYLE  LIBRE 2 SENSOR) MISC APPLY ONE SENSOR TO THE BACK OF THE UPPER ARM EVERY 14 DAYS    [provider]  dicyclomine  (BENTYL ) 10 MG capsule TAKE 1 CAPSULE BY MOUTH 30 MINS BEFORE MEALS AND AT BEDTIME AS NEEDED FOR  PAIN/CRAMPING 12/23/18   Zehr, Jessica D, PA-C  enoxaparin  (LOVENOX ) 120 MG/0.8ML injection Inject 0.8 mLs (120 mg total) into the skin daily. 03/07/24   Lonni Slain, MD  ezetimibe  (ZETIA ) 10 MG tablet TAKE 1 TABLET BY MOUTH EVERY DAY 07/26/23   Hilty, Vinie BROCKS, MD  FLOVENT  HFA 220 MCG/ACT inhaler Inhale 1 puff into the lungs 2 (two) times daily. 04/20/16   [provider]  fluticasone  (FLONASE ) 50 MCG/ACT nasal spray Place 1 spray into both nostrils 2 (two) times daily.  06/02/12   [provider]  losartan  (COZAAR ) 25 MG tablet Take 1 tablet (25 mg total) by mouth daily. 12/20/13   Allred, Lynwood, MD  meclizine  (ANTIVERT ) 25 MG tablet Take 1 tablet (25 mg total) by mouth 3 (three) times daily as needed for dizziness. 01/19/18   Randol Simmonds, MD  Multiple Vitamin (MULTIVITAMIN WITH MINERALS) TABS tablet Take 1 tablet by mouth in the morning.    [provider]  nitroGLYCERIN  (NITROSTAT ) 0.4 MG SL tablet PLACE 1 TABLET UNDER THE TONGUE EVERY 5 MINUTES AS NEEDED. 05/22/21   Leverne Charlies Helling, PA-C  omeprazole  (PRILOSEC) 40 MG capsule TAKE 1 CAPSULE BY MOUTH DAILY, CAN INCREASE TO 2 TIMES DAILY IF NEEDED Patient taking differently: Take 40 mg by mouth in the morning and at bedtime. 01/14/23   Kerman Vina HERO, NP  OZEMPIC, 1 MG/DOSE, 4 MG/3ML SOPN Inject 1 mg into the skin every Monday. 09/08/22   [provider]  polyethylene glycol (MIRALAX ) 17 g packet Take 17 g by mouth 2 (two) times daily. Titrate as needed Patient taking differently: Take 17 g by mouth 2 (two) times daily as needed (constipation.). 01/23/21   Armbruster, Elspeth SQUIBB, MD  PROAIR  HFA 108 (90 BASE) MCG/ACT inhaler Inhale 2 puffs into the lungs every 6 (six) hours as needed for wheezing  or shortness of breath.  03/22/12   [provider]  scopolamine  (TRANSDERM-SCOP) 1 MG/3DAYS Place 1 patch onto the skin as needed. 05/04/23   [provider]  traMADol  (ULTRAM ) 50 MG tablet Take 1 tablet (50 mg total) by mouth every 6 (six) hours as needed. 03/14/24 03/14/25  Pace, Maryellen D, MD  venlafaxine  XR (EFFEXOR -XR) 37.5 MG 24 hr capsule Take 37.5 mg by mouth daily with breakfast.  11/21/12   [provider]  verapamil  (CALAN ) 80 MG tablet Take 160 mg by mouth 2 (two) times daily.    [provider]  warfarin (COUMADIN ) 2 MG tablet TAKE 4MG  DAILY EXCEPT 6MG  ON TUESDAYS, THURSDAYS,AND SATURDAYS AS DIRECTED BY ANTICOAGULATION CLINIC 02/10/24   Lonni Slain, MD     Physical Exam: Vitals:   03/18/24 1151 03/18/24 1155 03/18/24 1932  BP: (!) 180/93  (!) 150/73  Pulse: 91  72  Resp: 16  16  Temp: 99.6 F (37.6 C)  98.7 F (37.1 C)  TempSrc: Oral    SpO2: 96%  92%  Weight:  87.5 kg   Height:  5' 4 (1.626 m)     Physical Exam Vitals and nursing note reviewed.  HENT:     Mouth/Throat:     Mouth: Mucous membranes are moist.  Eyes:     Pupils: Pupils are equal, round, and reactive to light.  Cardiovascular:     Rate and Rhythm: Normal rate and regular rhythm.     Pulses: Normal pulses.     Heart sounds: Normal heart sounds.  Pulmonary:     Effort: Pulmonary effort is normal.     Breath  sounds: Normal breath sounds.  Abdominal:     Palpations: Abdomen is soft.  Musculoskeletal:     Cervical back: Neck supple.  Skin:    Capillary Refill: Capillary refill takes less than 2 seconds.  Neurological:     Mental Status: She is alert and oriented to person, place, and time.  Psychiatric:        Mood and Affect: Mood normal.      Labs on Admission: I have personally reviewed following labs and imaging studies  CBC: Recent Labs  Lab 03/18/24 1219  WBC 10.9*  NEUTROABS 7.7  HGB 14.5  HCT 42.4  MCV 90.6  PLT 348   Basic  Metabolic Panel: Recent Labs  Lab 03/18/24 1219  NA 137  K 2.9*  CL 100  CO2 24  GLUCOSE 243*  BUN 15  CREATININE 0.79  CALCIUM  9.5   GFR: Estimated Creatinine Clearance: 71 mL/min (by C-G formula based on SCr of 0.79 mg/dL). Liver Function Tests: Recent Labs  Lab 03/18/24 1219  AST 16  ALT 14  ALKPHOS 74  BILITOT 0.4  PROT 7.4  ALBUMIN 4.1   No results for input(s): LIPASE, AMYLASE in the last 168 hours. No results for input(s): AMMONIA in the last 168 hours. Coagulation Profile: Recent Labs  Lab 03/14/24 1000  INR 1.0   Cardiac Enzymes: No results for input(s): CKTOTAL, CKMB, CKMBINDEX, TROPONINI, TROPONINIHS in the last 168 hours. BNP (last 3 results) No results for input(s): BNP in the last 8760 hours. HbA1C: No results for input(s): HGBA1C in the last 72 hours. CBG: Recent Labs  Lab 03/14/24 0919 03/14/24 1136  GLUCAP 107* 130*   Lipid Profile: No results for input(s): CHOL, HDL, LDLCALC, TRIG, CHOLHDL, LDLDIRECT in the last 72 hours. Thyroid  Function Tests: No results for input(s): TSH, T4TOTAL, FREET4, T3FREE, THYROIDAB in the last 72 hours. Anemia Panel: No results for input(s): VITAMINB12, FOLATE, FERRITIN, TIBC, IRON , RETICCTPCT in the last 72 hours. Urine analysis:    Component Value Date/Time   COLORURINE YELLOW 03/18/2024 1518   APPEARANCEUR CLOUDY (A) 03/18/2024 1518   LABSPEC 1.025 03/18/2024 1518   PHURINE 5.0 03/18/2024 1518   GLUCOSEU 150 (A) 03/18/2024 1518   HGBUR LARGE (A) 03/18/2024 1518   BILIRUBINUR NEGATIVE 03/18/2024 1518   KETONESUR NEGATIVE 03/18/2024 1518   PROTEINUR 100 (A) 03/18/2024 1518   UROBILINOGEN 1.0 11/29/2014 1300   NITRITE NEGATIVE 03/18/2024 1518   LEUKOCYTESUR SMALL (A) 03/18/2024 1518    Radiological Exams on Admission: I have personally reviewed images CT Renal Stone Study Result Date: 03/18/2024 EXAM: CT UROGRAM 03/18/2024 12:28:00 PM TECHNIQUE:  CT of the abdomen and pelvis was performed without the administration of intravenous contrast as per CT urogram protocol. Multiplanar reformatted images as well as MIP urogram images are provided for review. Automated exposure control, iterative reconstruction, and/or weight based adjustment of the mA/kV was utilized to reduce the radiation dose to as low as reasonably achievable. COMPARISON: CT renal 09:30 2022021 CLINICAL HISTORY: Abdominal/flank pain, stone suspected. FINDINGS: LOWER CHEST: Foci of gas along the left costophrenic angle, likely due to pulmonary parenchyma with adjacent atelectasis (2.14). LIVER: The liver is unremarkable. GALLBLADDER AND BILE DUCTS: Postcholecystectomy. No biliary ductal dilatation. SPLEEN: No acute abnormality. PANCREAS: No acute abnormality. ADRENAL GLANDS: No acute abnormality. KIDNEYS, URETERS AND BLADDER: Calcified stones within the bilateral kidneys measuring 5 mm on the right and 3 mm on the left. There is also a 9 mm calcified stone within the left renal pelvis with associated  mild fullness of the collecting system and associated urothelial thickening. No ureterolithiasis bilaterally. No hydroureter. No right hydronephrosis. Mild fullness of the left collecting system associated with the left renal pelvis stone. No perinephric or periureteral stranding. Urinary bladder is unremarkable. GI AND BOWEL: Sigmoid resection. No small or large bowel thickening or dilatation. The appendix is unremarkable. Tiny hiatal hernia. Stomach demonstrates no acute abnormality. There is no bowel obstruction. PERITONEUM AND RETROPERITONEUM: No ascites. No free air. VASCULATURE: Aorta is normal in caliber. LYMPH NODES: No lymphadenopathy. REPRODUCTIVE ORGANS: The uterus is unremarkable. Bilateral adnexal regions are unremarkable with no mass. BONES AND SOFT TISSUES: No acute osseous abnormality. Persistent soft tissue densities within the anterior abdominal wall soft tissues are likely related  to medication injection. IMPRESSION: 1. Minimally obstructive 9 mm calcified stone within the left renal pelvis with associated urothelial thickening concerning for superimposed infection - correlate with urinalysis. 2. Nonobstructive bilateral nephrolithiasis. 3. Cholecystectomy and sigmoid resection. 4. Other, non-acute and/or normal findings as above. Electronically signed by: Morgane Naveau MD 03/18/2024 01:41 PM EST RP Workstation: [id number]       Assessment/Plan: Principal Problem:   Complicated UTI (urinary tract infection) Active Problems:   Obstructive uropathy   Paroxysmal atrial fibrillation (HCC)   Essential hypertension   GERD (gastroesophageal reflux disease)   History of DVT (deep vein thrombosis)   Non-insulin  dependent type 2 diabetes mellitus (HCC)   Mood disorder   Chronic anemia   SIRS (systemic inflammatory response syndrome) (HCC)    Assessment and Plan: Complicated UTI History of recurrent obstructive nephropathy status post stent placement Left-sided minimal obstructive uropathy SIRS -Patient presents emergency department complaining of nausea, vomiting, abdominal pain, fever, dysuria and right-sided flank pain. On 03/14/24, patient had Cystoscopy, bilateral ureteroscopy, laser lithotripsy, basket stone extraction, bilateral 51F x 24cm ureteral stent placement - with string, and bilateral retrograde pyelography with Dr. Elisabeth. It's noted in op note that all remaining stone fragments were <1 mm. Patient states her right stent fell out on 12/25, and her left one fell out last night.  -CBC showing leukocytosis.  Normal lactic acid level.  UA showed evidence of UTI.  Pending urine culture blood culture result.  CMP showed low potassium 2.9. -CT stone renal study showed minimally obstructing 9 mm calcified left renal pelvis stone concern for superimposed infection with urothelial thickening.  Nonobstructive bilateral nephrolithiasis. -EDP discussed case with on-call  urology, per urology no acute intervention requires at this time and patient can be discharged to home. - As patient has significant flank pain with associated dysuria and meet SIRS criteria admitting patient to treat with IV antibiotic. - Continue IV ciprofloxacin .  Need to follow-up with repeat urine culture result and blood cultures are pending. -Continue pain control with Tylenol , tramadol  and morphine  as needed. Continue Zofran  as needed for nausea control.   Paroxysmal defibrillation -Heart rate is well-controlled on cardiac monitor.  Getting EKG.SABRA Murray Coumadin  with pharmacy consult  Essential hypertension -Continue losartan .  Start verapamil  based on blood pressure trend.  History of GERD -Continue Protonix   History of DVT Continue warfarin  Non-insulin -dependent DM type II -Continue sliding scale insulin  mealtime coverage   Mood disorder -Continue Effexor   Chronic anemia Stable H&H continue to monitor   DVT prophylaxis:  Coumadin  Code Status:  Full Code Diet: Heart healthy carb modified diet Family Communication:   Family was present at bedside, at the time of interview. Opportunity was given to ask question and all questions were answered satisfactorily.  Disposition Plan:  Follow-up with urine culture blood cultures result. Consults: Urology Admission status:   Inpatient, Telemetry bed  Severity of Illness: The appropriate patient status for this patient is INPATIENT. Inpatient status is judged to be reasonable and necessary in order to provide the required intensity of service to ensure the patient's safety. The patient's presenting symptoms, physical exam findings, and initial radiographic and laboratory data in the context of their chronic comorbidities is felt to place them at high risk for further clinical deterioration. Furthermore, it is not anticipated that the patient will be medically stable for discharge from the hospital within 2 midnights of  admission.   * I certify that at the point of admission it is my clinical judgment that the patient will require inpatient hospital care spanning beyond 2 midnights from the point of admission due to high intensity of service, high risk for further deterioration and high frequency of surveillance required.DEWAINE    Iyonna Rish, MD [facility]  How to contact the [facility] Attending or Consulting provider 7A - 7P or covering provider during after hours 7P -7A, for this patient.  Check the care team in [facility] and look for a) attending/consulting [facility] provider listed and b) the [facility] team listed Log into [email or web address] and use [facility] universal password to access. If you do not have the password, please contact the hospital operator. Locate the [facility] provider you are looking for under [facility] and page to a number that you can be directly reached. If you still have difficulty reaching the provider, please page the [facility] (Director on Call) for the Hospitalists listed on amion for assistance.  03/18/2024, 8:12 PM           [1]  Allergies Allergen Reactions   Aspirin Other (See Comments)    Makes asthma worse and makes stomach hurt   Banana Swelling and Cough    Inside of the mouth swells   Food Other (See Comments) and Cough    Nuts- mucus membranes inflamed/cough    Iodinated Contrast Media Anaphylaxis    Required epinephrine . Since then, has tolerated with premed. Other reaction(s): wheeze...requires premdication with prednisone  3 days in advance   Latex Hives and Cough    Wheezing/cough  Other reaction(s): hives/wheeze   Meperidine  Hcl Other (See Comments)    Projectile vomiting    Peanut-Containing Drug Products Swelling and Cough    Inside of the mouth swells   Penicillins Anaphylaxis    Has patient had a PCN reaction causing immediate rash, facial/tongue/throat swelling, SOB or lightheadedness with hypotension:Yes Has patient had a PCN reaction causing severe rash  involving mucus membranes or skin necrosis:Yes Has patient had a PCN reaction that required hospitalization:Treated in ER w/epi & breathing treatments Has patient had a PCN reaction occurring within the last 10 years:No If all of the above answers are NO, then may proceed with Cephalosporin use.    Statins Other (See Comments)    Severe leg pain - has tried most of them   Sulfonamide Derivatives Anaphylaxis   Tape Hives and Dermatitis    No plastic tape!!   Alfuzosin Other (See Comments)    Headache, dizziness   Halothane     Listed under anesthesia adverse reactions pt does not know what reaction or if any at preop on 05/26/21.    Hydrocodone  Itching   Other Other (See Comments)    Vascepa- upset stomach   Sulfamethoxazole Hives   "

## 2024-03-18 NOTE — Progress Notes (Signed)
 PHARMACY - ANTICOAGULATION CONSULT NOTE  Pharmacy Consult for warfarin Indication: atrial fibrillation, history of DVT  Allergies[1]  Patient Measurements: Height: 5' 4 (162.6 cm) Weight: 87.5 kg (193 lb) IBW/kg (Calculated) : 54.7 HEPARIN  DW (KG): 74.1  Vital Signs: Temp: 98.7 F (37.1 C) (12/27 1932) Temp Source: Oral (12/27 1151) BP: 150/73 (12/27 1932) Pulse Rate: 72 (12/27 1932)  Labs: Recent Labs    03/18/24 1219 03/18/24 2015  HGB 14.5  --   HCT 42.4  --   PLT 348  --   LABPROT  --  21.8*  INR  --  1.8*  CREATININE 0.79  --     Estimated Creatinine Clearance: 71 mL/min (by C-G formula based on SCr of 0.79 mg/dL).   Medical History: Past Medical History:  Diagnosis Date   Allergic rhinitis    Anginal pain    Arthritis    back (05/07/2016)   Asthma    Atrial flutter (HCC)    a. Remotely per notes.   Carpal tunnel syndrome, bilateral    Colon polyps    Diabetes mellitus without complication (HCC)    Diverticulitis    Dizziness    DVT (deep venous thrombosis) (HCC) 1980s x2 -    between birth of two sons. Saw hematology - was told she has a hypercoagulable disorder, should be on Coumadin  lifelong.   Dysrhythmia    atrial fib   Factor V Leiden    Family history of adverse reaction to anesthesia    mother gets PONV   Gallbladder disease    GERD (gastroesophageal reflux disease)    GIB (gastrointestinal bleeding) 04/14/2018   Halothane adverse reaction    narrow small opening   Heart murmur    Hiatal hernia    History of hiatal hernia    small one/CTA 05/01/2016 (05/07/2016)   History of kidney stones    HTN (hypertension)    Hypercholesteremia    hx; brought it down w/diet (05/07/2016)   Hyperglycemia    a. A1c 6.1 in 2014.   Inguinal hernia    Iron  deficiency anemia due to chronic blood loss 04/14/2018   Iron  malabsorption 04/14/2018   Left sided sciatica    Normal cardiac stress test 06/2012   Obesity    OSA (obstructive sleep  apnea)    a. Mild, did not tolerate CPAP (05/07/2016)   PAF (paroxysmal atrial fibrillation) (HCC)    a. s/p afib ablation at [facility] 2010 (Dr. Arlon). b. Recurrent AF s/p DCCV 06/2010. c. On flecainide .    Paroxysmal atrial fibrillation (HCC) 09/09/2009   Qualifier: Diagnosis of  By: Lawernce CMA, Jewel     PONV (postoperative nausea and vomiting)    PPD positive, treated 1977   treated for 1 yr after exposure to patient   RSV infection ~ 2006   Vertigo    Wandering (atrial) pacemaker    a. Remotely per notes.  (  pt. states has a wandering p wave ) no pacemaker    Medications:  Warfarin 6 mg Tues/Thurs/Sat, 4 mg all other days Regimen confirmed with patient and consistent with last anticoag visit 12/16 - INR 2.2 at that visit  Assessment: 68 year old patient with atrial fibrillation and history of DVT on warfarin PTA. She presents with right flank pain s/p bilateral stone extraction and stent placement on 12/23. Pharmacy consulted to continue warfarin.  INR 1.8 on admission.  Of note, patient started on ciprofloxacin  which can INCREASE INR.   Goal of Therapy:  INR 2-3 Monitor platelets by anticoagulation protocol: Yes   Plan:  -Pt tells me she has not had her warfarin today -Will give slightly reduced dose of 4 mg (normally takes 6 mg on Saturdays) in anticipation of INR elevation with addition of ciprofloxacin  -Will likely need reduced dose while on antibiotics -Continue to monitor INR daily   Stefano MARLA Bologna, PharmD, BCPS Clinical Pharmacist 03/18/2024 9:03 PM       [1]  Allergies Allergen Reactions   Aspirin Other (See Comments)    Makes asthma worse and makes stomach hurt   Banana Swelling and Cough    Inside of the mouth swells   Food Other (See Comments) and Cough    Nuts- mucus membranes inflamed/cough    Iodinated Contrast Media Anaphylaxis    Required epinephrine . Since then, has tolerated with premed. Other reaction(s): wheeze...requires  premdication with prednisone  3 days in advance   Latex Hives and Cough    Wheezing/cough  Other reaction(s): hives/wheeze   Meperidine  Hcl Other (See Comments)    Projectile vomiting    Peanut-Containing Drug Products Swelling and Cough    Inside of the mouth swells   Penicillins Anaphylaxis    Has patient had a PCN reaction causing immediate rash, facial/tongue/throat swelling, SOB or lightheadedness with hypotension:Yes Has patient had a PCN reaction causing severe rash involving mucus membranes or skin necrosis:Yes Has patient had a PCN reaction that required hospitalization:Treated in ER w/epi & breathing treatments Has patient had a PCN reaction occurring within the last 10 years:No If all of the above answers are NO, then may proceed with Cephalosporin use.    Statins Other (See Comments)    Severe leg pain - has tried most of them   Sulfonamide Derivatives Anaphylaxis   Tape Hives and Dermatitis    No plastic tape!!   Alfuzosin Other (See Comments)    Headache, dizziness   Halothane     Listed under anesthesia adverse reactions pt does not know what reaction or if any at preop on 05/26/21.    Hydrocodone  Itching   Other Other (See Comments)    Vascepa- upset stomach   Sulfamethoxazole Hives

## 2024-03-19 DIAGNOSIS — E119 Type 2 diabetes mellitus without complications: Secondary | ICD-10-CM | POA: Diagnosis not present

## 2024-03-19 DIAGNOSIS — D649 Anemia, unspecified: Secondary | ICD-10-CM | POA: Diagnosis not present

## 2024-03-19 DIAGNOSIS — Z86718 Personal history of other venous thrombosis and embolism: Secondary | ICD-10-CM | POA: Diagnosis not present

## 2024-03-19 DIAGNOSIS — I480 Paroxysmal atrial fibrillation: Secondary | ICD-10-CM | POA: Diagnosis not present

## 2024-03-19 DIAGNOSIS — I10 Essential (primary) hypertension: Secondary | ICD-10-CM | POA: Diagnosis not present

## 2024-03-19 DIAGNOSIS — N139 Obstructive and reflux uropathy, unspecified: Secondary | ICD-10-CM | POA: Diagnosis not present

## 2024-03-19 DIAGNOSIS — R6510 Systemic inflammatory response syndrome (SIRS) of non-infectious origin without acute organ dysfunction: Secondary | ICD-10-CM | POA: Diagnosis not present

## 2024-03-19 DIAGNOSIS — N10 Acute tubulo-interstitial nephritis: Secondary | ICD-10-CM

## 2024-03-19 DIAGNOSIS — K219 Gastro-esophageal reflux disease without esophagitis: Secondary | ICD-10-CM | POA: Diagnosis not present

## 2024-03-19 DIAGNOSIS — F39 Unspecified mood [affective] disorder: Secondary | ICD-10-CM | POA: Diagnosis not present

## 2024-03-19 DIAGNOSIS — N390 Urinary tract infection, site not specified: Secondary | ICD-10-CM | POA: Diagnosis not present

## 2024-03-19 LAB — COMPREHENSIVE METABOLIC PANEL WITH GFR
ALT: 42 U/L (ref 0–44)
AST: 67 U/L — ABNORMAL HIGH (ref 15–41)
Albumin: 3.4 g/dL — ABNORMAL LOW (ref 3.5–5.0)
Alkaline Phosphatase: 75 U/L (ref 38–126)
Anion gap: 7 (ref 5–15)
BUN: 15 mg/dL (ref 8–23)
CO2: 27 mmol/L (ref 22–32)
Calcium: 8.9 mg/dL (ref 8.9–10.3)
Chloride: 102 mmol/L (ref 98–111)
Creatinine, Ser: 0.93 mg/dL (ref 0.44–1.00)
GFR, Estimated: >60 mL/min
Glucose, Bld: 118 mg/dL — ABNORMAL HIGH (ref 70–99)
Potassium: 3.7 mmol/L (ref 3.5–5.1)
Sodium: 136 mmol/L (ref 135–145)
Total Bilirubin: 0.4 mg/dL (ref 0.0–1.2)
Total Protein: 5.9 g/dL — ABNORMAL LOW (ref 6.5–8.1)

## 2024-03-19 LAB — CBG MONITORING, ED
Glucose-Capillary: 101 mg/dL — ABNORMAL HIGH (ref 70–99)
Glucose-Capillary: 116 mg/dL — ABNORMAL HIGH (ref 70–99)
Glucose-Capillary: 148 mg/dL — ABNORMAL HIGH (ref 70–99)
Glucose-Capillary: 116 mg/dL — ABNORMAL HIGH (ref 70–99)

## 2024-03-19 LAB — CBC
HCT: 34.5 % — ABNORMAL LOW (ref 36.0–46.0)
Hemoglobin: 11.5 g/dL — ABNORMAL LOW (ref 12.0–15.0)
MCH: 30.7 pg (ref 26.0–34.0)
MCHC: 33.3 g/dL (ref 30.0–36.0)
MCV: 92.2 fL (ref 80.0–100.0)
Platelets: 269 K/uL (ref 150–400)
RBC: 3.74 MIL/uL — ABNORMAL LOW (ref 3.87–5.11)
RDW: 12.4 % (ref 11.5–15.5)
WBC: 8.8 K/uL (ref 4.0–10.5)
nRBC: 0 % (ref 0.0–0.2)

## 2024-03-19 LAB — PROTIME-INR
INR: 2 — ABNORMAL HIGH (ref 0.8–1.2)
Prothrombin Time: 23.5 s — ABNORMAL HIGH (ref 11.4–15.2)

## 2024-03-19 LAB — CK: Total CK: 23 U/L — ABNORMAL LOW (ref 38–234)

## 2024-03-19 MED ORDER — VERAPAMIL HCL 40 MG PO TABS
160.0000 mg | ORAL_TABLET | Freq: Two times a day (BID) | ORAL | Status: DC
  Administered 2024-03-19: 160 mg via ORAL
  Filled 2024-03-19: qty 1

## 2024-03-19 MED ORDER — WARFARIN SODIUM 3 MG PO TABS
3.0000 mg | ORAL_TABLET | Freq: Once | ORAL | Status: AC
  Administered 2024-03-19: 3 mg via ORAL
  Filled 2024-03-19: qty 1

## 2024-03-19 NOTE — Progress Notes (Signed)
 " PROGRESS NOTE  [name] FMW:[id number] DOB: 26-Jun-1955   PCP: Seabron Lenis, MD  Patient is from: Home.  Lives with husband.  Independently ambulates at baseline.  DOA: 03/18/2024 LOS: 1  Chief complaints Chief Complaint  Patient presents with   right flank pain     Brief Narrative / Interim history: 68 year old F with PMH of DM-2, HTN, HLD, PAF on warfarin, DVT and vertigo returning with nausea, vomiting, dysuria and right flank pain and admitted with sepsis in the setting of complicated UTI.  Patient was diagnosed with bilateral renal pelvic calculi for which she had a cystoscopy, bilateral uteroscopy with laser lithotripsy and basket stone extraction and bilateral ureteral stent placement on 03/14/2024.  Ureteral stents fell out on 12/25 and 12/26.  In ED, BP elevated to 180/93.  WBC 10.9.  K2.9.  Glucose 243.  Lactic acid normal.  UA concerning for hematuria.  CT renal stone study showed minimally obstructing 9 mm calcified stone within the left renal pelvis with associated urothelial thickening concerning for superimposed infection, and other nonobstructing bilateral nephrolithiasis.  Urine culture obtained.  Urology consulted but did not feel she needs any urologic intervention and recommended discharge from ED.  However, patient admitted due to concern for sepsis.  Started on IV fluid and ceftriaxone as well as potassium supplementation   Patient continues to endorse right-sided back pain.  She has CVA tenderness on exam raising concern for pyelonephritis.  Subjective: Seen and examined earlier this morning.  No major events overnight or this morning.  Reports significant right-sided back pain.  She rates her pain 8/10 but improved to 5/10 after IV morphine .  Denies nausea, vomiting or dysuria.   Assessment and plan: Acute right pyelonephritis-as right back pain with right CVA tenderness. Recurrent obstructive nephrolithiasis s/p stent placement, lithotripsy and basket  stone extraction on 12/23 Bilateral nonobstructive nephrolithiasis-noted on CT renal stone study as above Microscopic hematuria Sepsis ruled out.  -UA with hematuria.  Blood cultures NGTD.  CT renal stone study as above. -Continue IV ciprofloxacin  pending urine culture -Urology consulted in ED and did not feel there is indication for urologic intervention at this point -Continue multimodal pain control with Tylenol , tramadol  and morphine   Paroxysmal defibrillation: Rate controlled. -Continue warfarin per pharmacy - Start home verapamil  at bedtime.  Essential hypertension: Normotensive -Discontinue losartan  and start verapamil  tonight.  NIDDM-2 with hyperglycemia: A1c 5.9%.  On Ozempic at home. Recent Labs  Lab 03/14/24 0919 03/14/24 1136 03/18/24 2214 03/19/24 0802  GLUCAP 107* 130* 143* 101*  - Continue SSI-very sensitive   History of GERD -Continue Protonix    History of DVT -Continue warfarin   History of vertigo: Steady on her feet when she ambulated to bathroom. -P.o. meclizine  as needed   Mood disorder -Continue Effexor    Normocytic anemia: H&H seems to be at baseline.  Some element of hemoconcentration on admission. -Continue monitoring  Class I obesity Body mass index is 33.13 kg/m.          DVT prophylaxis:  SCDs Start: 03/18/24 1945 Place TED hose Start: 03/18/24 1945 warfarin (COUMADIN ) tablet 3 mg  Code Status: Full code Family Communication: None at bedside Level of care: Telemetry Status is: Inpatient Remains inpatient appropriate because: Acute pyelonephritis in patient with recent urologic procedure   Final disposition: Home   55 minutes with more than 50% spent in reviewing records, counseling patient/family and coordinating care.  Consultants:  Urology in ED  Procedures: None  Microbiology summarized: Blood cultures NGTD Urine culture  pending  Objective: Vitals:   03/19/24 0110 03/19/24 0430 03/19/24 0800 03/19/24 0813   BP: (!) 120/59 (!) 143/72 127/69   Pulse: 74 80 72   Resp: (!) 23 17 19    Temp: 98.6 F (37 C) 97.9 F (36.6 C)  98.4 F (36.9 C)  TempSrc: Oral   Oral  SpO2: 93% 97% 95%   Weight:      Height:        Examination:  GENERAL: No apparent distress.  Nontoxic. HEENT: MMM.  Vision and hearing grossly intact.  NECK: Supple.  No apparent JVD.  RESP:  No IWOB.  Fair aeration bilaterally. CVS:  RRR. Heart sounds normal.  ABD/GI/GU: BS+. Abd soft, NTND.  Right CVA tenderness MSK/EXT:  Moves extremities. No apparent deformity. No edema.  SKIN: no apparent skin lesion or wound NEURO: AA.  Oriented appropriately.  No apparent focal neuro deficit. PSYCH: Calm. Normal affect.   Sch Meds:  Scheduled Meds:  budesonide  (PULMICORT ) nebulizer solution  0.5 mg Nebulization BID   ezetimibe   10 mg Oral Daily   fluticasone   1 spray Each Nare BID   insulin  aspart  0-5 Units Subcutaneous QHS   insulin  aspart  0-6 Units Subcutaneous TID WC   pantoprazole   40 mg Oral BID   sodium chloride  flush  3 mL Intravenous Q12H   sodium chloride  flush  3 mL Intravenous Q12H   venlafaxine  XR  37.5 mg Oral Q breakfast   verapamil   160 mg Oral Q12H   warfarin  3 mg Oral ONCE-1600   Warfarin - Pharmacist Dosing Inpatient   Does not apply q1600   Continuous Infusions:  sodium chloride      ciprofloxacin  Stopped (03/19/24 0555)   PRN Meds:.sodium chloride , acetaminophen  **OR** acetaminophen , albuterol , meclizine , morphine  injection, ondansetron  **OR** ondansetron  (ZOFRAN ) IV, polyethylene glycol, sodium chloride  flush, traMADol   Antimicrobials: Anti-infectives (From admission, onward)    Start     Dose/Rate Route Frequency Ordered Stop   03/19/24 0500  ciprofloxacin  (CIPRO ) IVPB 400 mg        400 mg 200 mL/hr over 60 Minutes Intravenous Every 12 hours 03/18/24 1918 03/29/24 0459   03/18/24 1600  ciprofloxacin  (CIPRO ) IVPB 400 mg  Status:  Discontinued        400 mg 200 mL/hr over 60 Minutes Intravenous  Every 12 hours 03/18/24 1551 03/18/24 1918        I have personally reviewed the following labs and images: CBC: Recent Labs  Lab 03/18/24 1219 03/19/24 0458  WBC 10.9* 8.8  NEUTROABS 7.7  --   HGB 14.5 11.5*  HCT 42.4 34.5*  MCV 90.6 92.2  PLT 348 269   BMP &GFR Recent Labs  Lab 03/18/24 1219 03/19/24 0458  NA 137 136  K 2.9* 3.7  CL 100 102  CO2 24 27  GLUCOSE 243* 118*  BUN 15 15  CREATININE 0.79 0.93  CALCIUM  9.5 8.9   Estimated Creatinine Clearance: 61.1 mL/min (by C-G formula based on SCr of 0.93 mg/dL). Liver & Pancreas: Recent Labs  Lab 03/18/24 1219 03/19/24 0458  AST 16 67*  ALT 14 42  ALKPHOS 74 75  BILITOT 0.4 0.4  PROT 7.4 5.9*  ALBUMIN 4.1 3.4*   No results for input(s): LIPASE, AMYLASE in the last 168 hours. No results for input(s): AMMONIA in the last 168 hours. Diabetic: No results for input(s): HGBA1C in the last 72 hours. Recent Labs  Lab 03/14/24 0919 03/14/24 1136 03/18/24 2214 03/19/24 0802  GLUCAP 107* 130*  143* 101*   Cardiac Enzymes: Recent Labs  Lab 03/19/24 0458  CKTOTAL 23*   No results for input(s): PROBNP in the last 8760 hours. Coagulation Profile: Recent Labs  Lab 03/14/24 1000 03/18/24 2015 03/19/24 0458  INR 1.0 1.8* 2.0*   Thyroid  Function Tests: No results for input(s): TSH, T4TOTAL, FREET4, T3FREE, THYROIDAB in the last 72 hours. Lipid Profile: No results for input(s): CHOL, HDL, LDLCALC, TRIG, CHOLHDL, LDLDIRECT in the last 72 hours. Anemia Panel: No results for input(s): VITAMINB12, FOLATE, FERRITIN, TIBC, IRON , RETICCTPCT in the last 72 hours. Urine analysis:    Component Value Date/Time   COLORURINE YELLOW 03/18/2024 1518   APPEARANCEUR CLOUDY (A) 03/18/2024 1518   LABSPEC 1.025 03/18/2024 1518   PHURINE 5.0 03/18/2024 1518   GLUCOSEU 150 (A) 03/18/2024 1518   HGBUR LARGE (A) 03/18/2024 1518   BILIRUBINUR NEGATIVE 03/18/2024 1518   KETONESUR  NEGATIVE 03/18/2024 1518   PROTEINUR 100 (A) 03/18/2024 1518   UROBILINOGEN 1.0 11/29/2014 1300   NITRITE NEGATIVE 03/18/2024 1518   LEUKOCYTESUR SMALL (A) 03/18/2024 1518   Sepsis Labs: Invalid input(s): PROCALCITONIN, LACTICIDVEN  Microbiology: Recent Results (from the past 240 hours)  Blood culture (routine x 2)     Status: None (Preliminary result)   Collection Time: 03/18/24 12:19 PM   Specimen: BLOOD  Result Value Ref Range Status   Specimen Description   Final    BLOOD BLOOD LEFT ARM Performed at [facility], [address], [place], [place] [address]    Special Requests   Final    BOTTLES DRAWN AEROBIC AND ANAEROBIC Blood Culture adequate volume Performed at [facility], [address], [place], [place] [address]    Culture   Final    NO GROWTH < 24 HOURS Performed at [facility], [address], [place], [place] [address]    Report Status PENDING  Incomplete  Blood culture (routine x 2)     Status: None (Preliminary result)   Collection Time: 03/18/24  1:25 PM   Specimen: BLOOD  Result Value Ref Range Status   Specimen Description   Final    BLOOD LEFT ANTECUBITAL Performed at [facility], [address], [place], [place] [address]    Special Requests   Final    BOTTLES DRAWN AEROBIC ONLY Blood Culture results may not be optimal due to an inadequate volume of blood received in culture bottles Performed at [facility], [address] [place], [place] [address]    Culture   Final    NO GROWTH < 24 HOURS Performed at [facility], [address], [place], [place] [address]    Report Status PENDING  Incomplete    Radiology Studies: CT Renal Stone Study Result Date: 03/18/2024 EXAM: CT UROGRAM 03/18/2024 12:28:00 PM TECHNIQUE: CT of the abdomen and pelvis was performed without the administration of intravenous contrast as per CT urogram protocol. Multiplanar  reformatted images as well as MIP urogram images are provided for review. Automated exposure control, iterative reconstruction, and/or weight based adjustment of the mA/kV was utilized to reduce the radiation dose to as low as reasonably achievable. COMPARISON: CT renal 09:30 2022021 CLINICAL HISTORY: Abdominal/flank pain, stone suspected. FINDINGS: LOWER CHEST: Foci of gas along the left costophrenic angle, likely due to pulmonary parenchyma with adjacent atelectasis (2.14). LIVER: The liver is unremarkable. GALLBLADDER AND BILE DUCTS: Postcholecystectomy. No biliary ductal dilatation. SPLEEN: No acute abnormality. PANCREAS: No acute abnormality. ADRENAL GLANDS: No  acute abnormality. KIDNEYS, URETERS AND BLADDER: Calcified stones within the bilateral kidneys measuring 5 mm on the right and 3 mm on the left. There is also a 9 mm calcified stone within the left renal pelvis with associated mild fullness of the collecting system and associated urothelial thickening. No ureterolithiasis bilaterally. No hydroureter. No right hydronephrosis. Mild fullness of the left collecting system associated with the left renal pelvis stone. No perinephric or periureteral stranding. Urinary bladder is unremarkable. GI AND BOWEL: Sigmoid resection. No small or large bowel thickening or dilatation. The appendix is unremarkable. Tiny hiatal hernia. Stomach demonstrates no acute abnormality. There is no bowel obstruction. PERITONEUM AND RETROPERITONEUM: No ascites. No free air. VASCULATURE: Aorta is normal in caliber. LYMPH NODES: No lymphadenopathy. REPRODUCTIVE ORGANS: The uterus is unremarkable. Bilateral adnexal regions are unremarkable with no mass. BONES AND SOFT TISSUES: No acute osseous abnormality. Persistent soft tissue densities within the anterior abdominal wall soft tissues are likely related to medication injection. IMPRESSION: 1. Minimally obstructive 9 mm calcified stone within the left renal pelvis with associated  urothelial thickening concerning for superimposed infection - correlate with urinalysis. 2. Nonobstructive bilateral nephrolithiasis. 3. Cholecystectomy and sigmoid resection. 4. Other, non-acute and/or normal findings as above. Electronically signed by: Morgane Naveau MD 03/18/2024 01:41 PM EST RP Workstation: [id number]      Ujbz T. Dempsey Ahonen [facility]  If 7PM-7AM, please contact night-coverage [email or web address] 03/19/2024, 12:11 PM   "

## 2024-03-19 NOTE — ED Notes (Signed)
 RN notified of pt's temp of 99.2

## 2024-03-19 NOTE — ED Notes (Signed)
 Pt SpO2 88-90 when lying resting. Pt reports mild sleep apnea, pt placed on 2 L O2 [place]

## 2024-03-19 NOTE — Progress Notes (Signed)
 PHARMACY - ANTICOAGULATION CONSULT NOTE  Pharmacy Consult for warfarin Indication: atrial fibrillation, history of DVT  Allergies[1]  Patient Measurements: Height: 5' 4 (162.6 cm) Weight: 87.5 kg (193 lb) IBW/kg (Calculated) : 54.7 HEPARIN  DW (KG): 74.1  Vital Signs: Temp: 98.4 F (36.9 C) (12/28 0813) Temp Source: Oral (12/28 0813) BP: 127/69 (12/28 0800) Pulse Rate: 72 (12/28 0800)  Labs: Recent Labs    03/18/24 1219 03/18/24 2015 03/19/24 0458  HGB 14.5  --  11.5*  HCT 42.4  --  34.5*  PLT 348  --  269  LABPROT  --  21.8* 23.5*  INR  --  1.8* 2.0*  CREATININE 0.79  --  0.93  CKTOTAL  --   --  23*    Estimated Creatinine Clearance: 61.1 mL/min (by C-G formula based on SCr of 0.93 mg/dL).   Medical History: Past Medical History:  Diagnosis Date   Allergic rhinitis    Anginal pain    Arthritis    back (05/07/2016)   Asthma    Atrial flutter (HCC)    a. Remotely per notes.   Carpal tunnel syndrome, bilateral    Colon polyps    Diabetes mellitus without complication (HCC)    Diverticulitis    Dizziness    DVT (deep venous thrombosis) (HCC) 1980s x2 -    between birth of two sons. Saw hematology - was told she has a hypercoagulable disorder, should be on Coumadin  lifelong.   Dysrhythmia    atrial fib   Factor V Leiden    Family history of adverse reaction to anesthesia    mother gets PONV   Gallbladder disease    GERD (gastroesophageal reflux disease)    GIB (gastrointestinal bleeding) 04/14/2018   Halothane adverse reaction    narrow small opening   Heart murmur    Hiatal hernia    History of hiatal hernia    small one/CTA 05/01/2016 (05/07/2016)   History of kidney stones    HTN (hypertension)    Hypercholesteremia    hx; brought it down w/diet (05/07/2016)   Hyperglycemia    a. A1c 6.1 in 2014.   Inguinal hernia    Iron  deficiency anemia due to chronic blood loss 04/14/2018   Iron  malabsorption 04/14/2018   Left sided sciatica     Normal cardiac stress test 06/2012   Obesity    OSA (obstructive sleep apnea)    a. Mild, did not tolerate CPAP (05/07/2016)   PAF (paroxysmal atrial fibrillation) (HCC)    a. s/p afib ablation at [facility] 2010 (Dr. Arlon). b. Recurrent AF s/p DCCV 06/2010. c. On flecainide .    Paroxysmal atrial fibrillation (HCC) 09/09/2009   Qualifier: Diagnosis of  By: Lawernce CMA, Jewel     PONV (postoperative nausea and vomiting)    PPD positive, treated 1977   treated for 1 yr after exposure to patient   RSV infection ~ 2006   Vertigo    Wandering (atrial) pacemaker    a. Remotely per notes.  (  pt. states has a wandering p wave ) no pacemaker    Medications:  Warfarin 6 mg Tues/Thurs/Sat, 4 mg all other days Regimen confirmed with patient and consistent with last anticoag visit 12/16 - INR 2.2 at that visit  Assessment: 68 year old patient with atrial fibrillation and history of DVT on warfarin PTA. She presents with right flank pain s/p bilateral stone extraction and stent placement on 12/23. Pharmacy consulted to continue warfarin.  Today, 03/19/2024: INR 1.8 >>  2.0 s/p warfarin 4mg  last night Hgb 14.5 >> 11.5 (low) Plts 348 >> 269 (stable) No s/sx of bleeding reported Of note, patient started on ciprofloxacin  which can INCREASE INR  Goal of Therapy:  INR 2-3 Monitor platelets by anticoagulation protocol: Yes   Plan:  Will give slightly reduced dose of 3 mg (normally takes 4 mg on Sundays) in anticipation of INR elevation with addition of ciprofloxacin   Will likely need reduced dose while on antibiotics Continue to monitor INR daily, CBC PRN given drop in hemoglobin/plts from yesterday   Lacinda Moats, PharmD Clinical Pharmacist  12/28/20259:53 AM    [1]  Allergies Allergen Reactions   Aspirin Other (See Comments)    Makes asthma worse and makes stomach hurt   Banana Swelling and Cough    Inside of the mouth swells   Food Other (See Comments) and Cough    Nuts- mucus  membranes inflamed/cough    Iodinated Contrast Media Anaphylaxis    Required epinephrine . Since then, has tolerated with premed. Other reaction(s): wheeze...requires premdication with prednisone  3 days in advance   Latex Hives and Cough    Wheezing/cough  Other reaction(s): hives/wheeze   Meperidine  Hcl Other (See Comments)    Projectile vomiting    Peanut-Containing Drug Products Swelling and Cough    Inside of the mouth swells   Penicillins Anaphylaxis    Has patient had a PCN reaction causing immediate rash, facial/tongue/throat swelling, SOB or lightheadedness with hypotension:Yes Has patient had a PCN reaction causing severe rash involving mucus membranes or skin necrosis:Yes Has patient had a PCN reaction that required hospitalization:Treated in ER w/epi & breathing treatments Has patient had a PCN reaction occurring within the last 10 years:No If all of the above answers are NO, then may proceed with Cephalosporin use.    Statins Other (See Comments)    Severe leg pain - has tried most of them   Sulfonamide Derivatives Anaphylaxis   Tape Hives and Dermatitis    No plastic tape!!   Alfuzosin Other (See Comments)    Headache, dizziness   Halothane     Listed under anesthesia adverse reactions pt does not know what reaction or if any at preop on 05/26/21.    Hydrocodone  Itching   Other Other (See Comments)    Vascepa- upset stomach   Sulfamethoxazole Hives

## 2024-03-20 ENCOUNTER — Ambulatory Visit

## 2024-03-20 DIAGNOSIS — N390 Urinary tract infection, site not specified: Secondary | ICD-10-CM | POA: Diagnosis not present

## 2024-03-20 DIAGNOSIS — N139 Obstructive and reflux uropathy, unspecified: Secondary | ICD-10-CM | POA: Diagnosis not present

## 2024-03-20 DIAGNOSIS — F39 Unspecified mood [affective] disorder: Secondary | ICD-10-CM | POA: Diagnosis not present

## 2024-03-20 DIAGNOSIS — D649 Anemia, unspecified: Secondary | ICD-10-CM | POA: Diagnosis not present

## 2024-03-20 DIAGNOSIS — Z86718 Personal history of other venous thrombosis and embolism: Secondary | ICD-10-CM | POA: Diagnosis not present

## 2024-03-20 DIAGNOSIS — K219 Gastro-esophageal reflux disease without esophagitis: Secondary | ICD-10-CM | POA: Diagnosis not present

## 2024-03-20 DIAGNOSIS — E119 Type 2 diabetes mellitus without complications: Secondary | ICD-10-CM | POA: Diagnosis not present

## 2024-03-20 DIAGNOSIS — R6510 Systemic inflammatory response syndrome (SIRS) of non-infectious origin without acute organ dysfunction: Secondary | ICD-10-CM | POA: Diagnosis not present

## 2024-03-20 DIAGNOSIS — I10 Essential (primary) hypertension: Secondary | ICD-10-CM | POA: Diagnosis not present

## 2024-03-20 DIAGNOSIS — I480 Paroxysmal atrial fibrillation: Secondary | ICD-10-CM | POA: Diagnosis not present

## 2024-03-20 LAB — COMPREHENSIVE METABOLIC PANEL WITH GFR
ALT: 28 U/L (ref 0–44)
AST: 25 U/L (ref 15–41)
Albumin: 3.4 g/dL — ABNORMAL LOW (ref 3.5–5.0)
Alkaline Phosphatase: 70 U/L (ref 38–126)
Anion gap: 9 (ref 5–15)
BUN: 14 mg/dL (ref 8–23)
CO2: 27 mmol/L (ref 22–32)
Calcium: 9 mg/dL (ref 8.9–10.3)
Chloride: 103 mmol/L (ref 98–111)
Creatinine, Ser: 0.72 mg/dL (ref 0.44–1.00)
GFR, Estimated: >60 mL/min
Glucose, Bld: 144 mg/dL — ABNORMAL HIGH (ref 70–99)
Potassium: 3.7 mmol/L (ref 3.5–5.1)
Sodium: 138 mmol/L (ref 135–145)
Total Bilirubin: 0.2 mg/dL (ref 0.0–1.2)
Total Protein: 6 g/dL — ABNORMAL LOW (ref 6.5–8.1)

## 2024-03-20 LAB — PROTIME-INR
INR: 1.8 — ABNORMAL HIGH (ref 0.8–1.2)
Prothrombin Time: 21.5 s — ABNORMAL HIGH (ref 11.4–15.2)

## 2024-03-20 LAB — CBC
HCT: 34.8 % — ABNORMAL LOW (ref 36.0–46.0)
Hemoglobin: 11.5 g/dL — ABNORMAL LOW (ref 12.0–15.0)
MCH: 30.6 pg (ref 26.0–34.0)
MCHC: 33 g/dL (ref 30.0–36.0)
MCV: 92.6 fL (ref 80.0–100.0)
Platelets: 237 K/uL (ref 150–400)
RBC: 3.76 MIL/uL — ABNORMAL LOW (ref 3.87–5.11)
RDW: 12.2 % (ref 11.5–15.5)
WBC: 4.8 K/uL (ref 4.0–10.5)
nRBC: 0 % (ref 0.0–0.2)

## 2024-03-20 LAB — I-STAT CG4 LACTIC ACID, ED: Lactic Acid, Venous: 0.7 mmol/L (ref 0.5–1.9)

## 2024-03-20 LAB — URINE CULTURE: Culture: 10000 — AB

## 2024-03-20 LAB — MAGNESIUM: Magnesium: 1.7 mg/dL (ref 1.7–2.4)

## 2024-03-20 MED ORDER — WARFARIN SODIUM 5 MG PO TABS
5.0000 mg | ORAL_TABLET | Freq: Once | ORAL | Status: DC
  Filled 2024-03-20: qty 1

## 2024-03-20 MED ORDER — LEVOFLOXACIN 750 MG PO TABS: 750.0000 mg | ORAL_TABLET | Freq: Every day | ORAL | Status: DC

## 2024-03-20 MED ORDER — POTASSIUM CHLORIDE CRYS ER 20 MEQ PO TBCR
40.0000 meq | EXTENDED_RELEASE_TABLET | Freq: Once | ORAL | Status: AC
  Administered 2024-03-20: 40 meq via ORAL
  Filled 2024-03-20: qty 2

## 2024-03-20 MED ORDER — LOSARTAN POTASSIUM 25 MG PO TABS: 25.0000 mg | ORAL_TABLET | Freq: Every day | ORAL | Status: DC

## 2024-03-20 MED ORDER — CEFADROXIL 500 MG PO CAPS: 1000.0000 mg | ORAL_CAPSULE | Freq: Two times a day (BID) | ORAL | Status: DC

## 2024-03-20 NOTE — Progress Notes (Signed)
 PHARMACY - ANTICOAGULATION CONSULT NOTE  Pharmacy Consult for warfarin  Indication: atrial fibrillation and DVT  Allergies[1]  Patient Measurements: Height: 5' 4 (162.6 cm) Weight: 87.5 kg (193 lb) IBW/kg (Calculated) : 54.7 HEPARIN  DW (KG): 74.1  Vital Signs: Temp: 98.6 F (37 C) (12/29 0414) Temp Source: Oral (12/29 0414) BP: 160/66 (12/29 0634) Pulse Rate: 67 (12/29 0634)  Labs: Recent Labs    03/18/24 1219 03/18/24 2015 03/19/24 0458 03/20/24 0500  HGB 14.5  --  11.5* 11.5*  HCT 42.4  --  34.5* 34.8*  PLT 348  --  269 237  LABPROT  --  21.8* 23.5* 21.5*  INR  --  1.8* 2.0* 1.8*  CREATININE 0.79  --  0.93 0.72  CKTOTAL  --   --  23*  --     Estimated Creatinine Clearance: 71 mL/min (by C-G formula based on SCr of 0.72 mg/dL).   Medical History: Past Medical History:  Diagnosis Date   Allergic rhinitis    Anginal pain    Arthritis    back (05/07/2016)   Asthma    Atrial flutter (HCC)    a. Remotely per notes.   Carpal tunnel syndrome, bilateral    Colon polyps    Diabetes mellitus without complication (HCC)    Diverticulitis    Dizziness    DVT (deep venous thrombosis) (HCC) 1980s x2 -    between birth of two sons. Saw hematology - was told she has a hypercoagulable disorder, should be on Coumadin  lifelong.   Dysrhythmia    atrial fib   Factor V Leiden    Family history of adverse reaction to anesthesia    mother gets PONV   Gallbladder disease    GERD (gastroesophageal reflux disease)    GIB (gastrointestinal bleeding) 04/14/2018   Halothane adverse reaction    narrow small opening   Heart murmur    Hiatal hernia    History of hiatal hernia    small one/CTA 05/01/2016 (05/07/2016)   History of kidney stones    HTN (hypertension)    Hypercholesteremia    hx; brought it down w/diet (05/07/2016)   Hyperglycemia    a. A1c 6.1 in 2014.   Inguinal hernia    Iron  deficiency anemia due to chronic blood loss 04/14/2018   Iron  malabsorption  04/14/2018   Left sided sciatica    Normal cardiac stress test 06/2012   Obesity    OSA (obstructive sleep apnea)    a. Mild, did not tolerate CPAP (05/07/2016)   PAF (paroxysmal atrial fibrillation) (HCC)    a. s/p afib ablation at [facility] 2010 (Dr. Arlon). b. Recurrent AF s/p DCCV 06/2010. c. On flecainide .    Paroxysmal atrial fibrillation (HCC) 09/09/2009   Qualifier: Diagnosis of  By: Lawernce CMA, Jewel     PONV (postoperative nausea and vomiting)    PPD positive, treated 1977   treated for 1 yr after exposure to patient   RSV infection ~ 2006   Vertigo    Wandering (atrial) pacemaker    a. Remotely per notes.  (  pt. states has a wandering p wave ) no pacemaker    Medications:  - PTA regimen per [facility] clinic note on 12/26: 4mg  daily except 6 mg on TTSat   Assessment: Patient is a 68 y.o F with hx DVT and afib on warfarin PTA who presented to the ED on 03/18/24 with c/o right flank pain s/p bilateral stone extraction and stent placement on 12/23. Warfarin resumed  on admission.  Today, 03/20/2024: - INR is slightly subtherapeutic at 1.8 - cbc stable - no bleeding documented  - drug-drug intxns: Levaquin  can increase INR   Goal of Therapy:  INR 2-3 Monitor platelets by anticoagulation protocol: Yes   Plan:  - warfarin 5 mg PO x1 - daily INR - monitor for s/sx bleeding   Henretter Piekarski P 03/20/2024,8:13 AM      [1]  Allergies Allergen Reactions   Aspirin Other (See Comments)    Makes asthma worse and makes stomach hurt   Banana Swelling and Cough    Inside of the mouth swells   Food Other (See Comments) and Cough    Nuts- mucus membranes inflamed/cough    Iodinated Contrast Media Anaphylaxis    Required epinephrine . Since then, has tolerated with premed. Other reaction(s): wheeze...requires premdication with prednisone  3 days in advance   Latex Hives and Cough    Wheezing/cough  Other reaction(s): hives/wheeze   Meperidine  Hcl Other (See Comments)     Projectile vomiting    Peanut-Containing Drug Products Swelling and Cough    Inside of the mouth swells   Penicillins Anaphylaxis    Has patient had a PCN reaction causing immediate rash, facial/tongue/throat swelling, SOB or lightheadedness with hypotension:Yes Has patient had a PCN reaction causing severe rash involving mucus membranes or skin necrosis:Yes Has patient had a PCN reaction that required hospitalization:Treated in ER w/epi & breathing treatments Has patient had a PCN reaction occurring within the last 10 years:No If all of the above answers are NO, then may proceed with Cephalosporin use.    Statins Other (See Comments)    Severe leg pain - has tried most of them   Sulfonamide Derivatives Anaphylaxis   Tape Hives and Dermatitis    No plastic tape!!   Alfuzosin Other (See Comments)    Headache, dizziness   Halothane     Listed under anesthesia adverse reactions pt does not know what reaction or if any at preop on 05/26/21.    Hydrocodone  Itching   Other Other (See Comments)    Vascepa- upset stomach   Sulfamethoxazole Hives

## 2024-03-20 NOTE — Discharge Summary (Signed)
 "  Physician Discharge Summary  [name] [facility] FMW:[id number] DOB: 1955/10/16 DOA: 03/18/2024  PCP: Seabron Lenis, MD  Admit date: 03/18/2024 Discharge date: 03/20/2024  Admitted From: Home Disposition: Home Recommendations for Outpatient Follow-up:  Outpatient follow-up with PCP and primary neurologist in 1 to 2 weeks Check CMP and CBC at follow-up Please follow up on the following pending results: None  Home Health: No need identified Equipment/Devices: No need identified  Discharge Condition: Stable CODE STATUS: Full code   Follow-up Information     Seabron Lenis, MD. Schedule an appointment as soon as possible for a visit in 1 week(s).   Specialty: Family Medicine Contact information: [address] [place] [address] [phone]                 Hospital course 68 year old F with PMH of DM-2, HTN, HLD, PAF on warfarin, DVT and vertigo returning with nausea, vomiting, dysuria and right flank pain and admitted with sepsis in the setting of complicated UTI.  Patient was diagnosed with bilateral renal pelvic calculi for which she had a cystoscopy, bilateral uteroscopy with laser lithotripsy and basket stone extraction and bilateral ureteral stent placement on 03/14/2024.  Ureteral stents fell out on 12/25 and 12/26.   In ED, BP elevated to 180/93.  WBC 10.9.  K2.9.  Glucose 243.  Lactic acid normal.  UA concerning for hematuria.  CT renal stone study showed minimally obstructing 9 mm calcified stone within the left renal pelvis with associated urothelial thickening concerning for superimposed infection, and other nonobstructing bilateral nephrolithiasis.  Urine culture obtained.  Urology consulted but did not feel she needs any urologic intervention and recommended discharge from ED.  However, patient admitted due to concern for sepsis.  Started on IV fluid and ceftriaxone as well as potassium supplementation   Patient continues to endorse right-sided  back pain.  She has CVA tenderness on exam raising concern for pyelonephritis.  Continued on IV Cipro .  On the day of discharge, blood cultures NGTD.  Urine culture with multiple species.  She felt better and discharged on p.o. Levaquin  for 7 more days for acute pyelonephritis.  See individual problem list below for more.   Problems addressed during this hospitalization Acute right pyelonephritis-as right back pain with right CVA tenderness. Recurrent obstructive nephrolithiasis s/p stent placement, lithotripsy and basket stone extraction on 12/23 Bilateral nonobstructive nephrolithiasis-noted on CT renal stone study as above Microscopic hematuria Sepsis ruled out.  -UA with hematuria.  Blood cultures NGTD.  Urine culture with multiple species.  CT renal stone study as above. -Received IV Cipro  in house and discharged on p.o. Levaquin  for 7 more days. -Urology consulted in ED and did not feel there is indication for urologic intervention at this point   Paroxysmal defibrillation: Rate controlled. -Continue home warfarin and verapamil .   Essential hypertension: Normotensive - Continue home losartan  and verapamil .   NIDDM-2 with hyperglycemia: A1c 5.9%.  On Ozempic at home. -Continue home meds.   History of GERD -Continue Protonix    History of DVT -Continue warfarin   History of vertigo: Steady on her feet when she ambulated to bathroom. -P.o. meclizine  as needed   Mood disorder -Continue Effexor    Normocytic anemia: H&H seems to be at baseline.  - Recheck CBC at follow-up.   Class I obesity Body mass index is 33.13 kg/m.           Consultations: Urology in ED  Time spent 35  minutes  Vital signs Vitals:  03/20/24 0414 03/20/24 0634 03/20/24 0814 03/20/24 0913  BP: (!) 162/74 (!) 160/66  (!) 110/93  Pulse: 77 67  77  Temp: 98.6 F (37 C)  98.2 F (36.8 C)   Resp: 16 15  18   Height:      Weight:      SpO2: 90% 92%  98%  TempSrc: Oral  Oral   BMI  (Calculated):         Discharge exam  GENERAL: No apparent distress.  Nontoxic. HEENT: MMM.  Vision and hearing grossly intact.  NECK: Supple.  No apparent JVD.  RESP:  No IWOB.  Fair aeration bilaterally. CVS:  RRR. Heart sounds normal.  ABD/GI/GU: BS+. Abd soft, NTND.  Right CVA tenderness. MSK/EXT:  Moves extremities. No apparent deformity. No edema.  SKIN: no apparent skin lesion or wound NEURO: Awake and alert. Oriented appropriately.  No apparent focal neuro deficit. PSYCH: Calm. Normal affect.   Discharge Instructions Discharge Instructions     Discharge instructions   Complete by: As directed    It has been a pleasure taking care of you!  You were hospitalized due to possible right kidney infection for which you have been started on antibiotics.  We are discharging him on antibiotics to complete treatment course.  Follow-up with your primary care doctor and urologist in 1 to 2 weeks or sooner if needed.  Maintain good hydration.   Take care,   Increase activity slowly   Complete by: As directed       Allergies as of 03/20/2024       Reactions   Aspirin Other (See Comments)   Makes asthma worse and makes stomach hurt   Banana Swelling, Cough   Inside of the mouth swells   Food Other (See Comments), Cough   Nuts- mucus membranes inflamed/cough   Iodinated Contrast Media Anaphylaxis   Required epinephrine . Since then, has tolerated with premed. Other reaction(s): wheeze...requires premdication with prednisone  3 days in advance   Latex Hives, Cough   Wheezing/cough Other reaction(s): hives/wheeze   Meperidine  Hcl Other (See Comments)   Projectile vomiting   Peanut-containing Drug Products Swelling, Cough   Inside of the mouth swells   Penicillins Anaphylaxis   Has patient had a PCN reaction causing immediate rash, facial/tongue/throat swelling, SOB or lightheadedness with hypotension:Yes Has patient had a PCN reaction causing severe rash involving mucus  membranes or skin necrosis:Yes Has patient had a PCN reaction that required hospitalization:Treated in ER w/epi & breathing treatments Has patient had a PCN reaction occurring within the last 10 years:No If all of the above answers are NO, then may proceed with Cephalosporin use.   Statins Other (See Comments)   Severe leg pain - has tried most of them   Sulfonamide Derivatives Anaphylaxis   Tape Hives, Dermatitis   No plastic tape!!   Alfuzosin Other (See Comments)   Headache, dizziness   Halothane    Listed under anesthesia adverse reactions pt does not know what reaction or if any at preop on 05/26/21.    Hydrocodone  Itching   Other Other (See Comments)   Vascepa- upset stomach   Sulfamethoxazole Hives        Medication List     STOP taking these medications    enoxaparin  120 MG/0.8ML injection Commonly known as: LOVENOX        TAKE these medications    acetaminophen  650 MG CR tablet Commonly known as: TYLENOL  Take 1,300 mg by mouth in the morning and at bedtime.  APPLE CIDER VINEGAR PO Take 450 mg by mouth in the morning.   cetirizine 10 MG tablet Commonly known as: ZYRTEC Take 10 mg by mouth in the morning.   dicyclomine  10 MG capsule Commonly known as: BENTYL  TAKE 1 CAPSULE BY MOUTH 30 MINS BEFORE MEALS AND AT BEDTIME AS NEEDED FOR PAIN/CRAMPING   ezetimibe  10 MG tablet Commonly known as: ZETIA  TAKE 1 TABLET BY MOUTH EVERY DAY   Flovent  HFA 220 MCG/ACT inhaler Generic drug: fluticasone  Inhale 1 puff into the lungs 2 (two) times daily.   fluticasone  50 MCG/ACT nasal spray Commonly known as: FLONASE  Place 1 spray into both nostrils 2 (two) times daily.   FreeStyle Libre 2 Sensor Misc APPLY ONE SENSOR TO THE BACK OF THE UPPER ARM EVERY 14 DAYS   losartan  25 MG tablet Commonly known as: COZAAR  Take 1 tablet (25 mg total) by mouth daily.   meclizine  25 MG tablet Commonly known as: ANTIVERT  Take 1 tablet (25 mg total) by mouth 3 (three) times  daily as needed for dizziness.   multivitamin with minerals Tabs tablet Take 1 tablet by mouth in the morning.   nitroGLYCERIN  0.4 MG SL tablet Commonly known as: NITROSTAT  PLACE 1 TABLET UNDER THE TONGUE EVERY 5 MINUTES AS NEEDED.   omeprazole  40 MG capsule Commonly known as: PRILOSEC TAKE 1 CAPSULE BY MOUTH DAILY, CAN INCREASE TO 2 TIMES DAILY IF NEEDED What changed: See the new instructions.   Ozempic (1 MG/DOSE) 4 MG/3ML Sopn Generic drug: Semaglutide (1 MG/DOSE) Inject 1 mg into the skin every Monday.   polyethylene glycol 17 g packet Commonly known as: MiraLax  Take 17 g by mouth 2 (two) times daily. Titrate as needed What changed:  when to take this reasons to take this additional instructions   ProAir  HFA 108 (90 Base) MCG/ACT inhaler Generic drug: albuterol  Inhale 2 puffs into the lungs every 6 (six) hours as needed for wheezing or shortness of breath.   traMADol  50 MG tablet Commonly known as: Ultram  Take 1 tablet (50 mg total) by mouth every 6 (six) hours as needed.   venlafaxine  XR 37.5 MG 24 hr capsule Commonly known as: EFFEXOR -XR Take 37.5 mg by mouth daily with breakfast.   verapamil  80 MG tablet Commonly known as: CALAN  Take 160 mg by mouth 2 (two) times daily.   Vitamin D3 50 MCG (2000 UT) capsule Take 2,000 Units by mouth in the morning.   warfarin 2 MG tablet Commonly known as: COUMADIN  Take as directed. If you are unsure how to take this medication, talk to your nurse or doctor. Original instructions: TAKE 4MG  DAILY EXCEPT 6MG  ON TUESDAYS, THURSDAYS,AND SATURDAYS AS DIRECTED BY ANTICOAGULATION CLINIC         Procedures/Studies:   CT Renal Stone Study Result Date: 03/18/2024 EXAM: CT UROGRAM 03/18/2024 12:28:00 PM TECHNIQUE: CT of the abdomen and pelvis was performed without the administration of intravenous contrast as per CT urogram protocol. Multiplanar reformatted images as well as MIP urogram images are provided for review. Automated  exposure control, iterative reconstruction, and/or weight based adjustment of the mA/kV was utilized to reduce the radiation dose to as low as reasonably achievable. COMPARISON: CT renal 09:30 2022021 CLINICAL HISTORY: Abdominal/flank pain, stone suspected. FINDINGS: LOWER CHEST: Foci of gas along the left costophrenic angle, likely due to pulmonary parenchyma with adjacent atelectasis (2.14). LIVER: The liver is unremarkable. GALLBLADDER AND BILE DUCTS: Postcholecystectomy. No biliary ductal dilatation. SPLEEN: No acute abnormality. PANCREAS: No acute abnormality. ADRENAL GLANDS: No acute abnormality.  KIDNEYS, URETERS AND BLADDER: Calcified stones within the bilateral kidneys measuring 5 mm on the right and 3 mm on the left. There is also a 9 mm calcified stone within the left renal pelvis with associated mild fullness of the collecting system and associated urothelial thickening. No ureterolithiasis bilaterally. No hydroureter. No right hydronephrosis. Mild fullness of the left collecting system associated with the left renal pelvis stone. No perinephric or periureteral stranding. Urinary bladder is unremarkable. GI AND BOWEL: Sigmoid resection. No small or large bowel thickening or dilatation. The appendix is unremarkable. Tiny hiatal hernia. Stomach demonstrates no acute abnormality. There is no bowel obstruction. PERITONEUM AND RETROPERITONEUM: No ascites. No free air. VASCULATURE: Aorta is normal in caliber. LYMPH NODES: No lymphadenopathy. REPRODUCTIVE ORGANS: The uterus is unremarkable. Bilateral adnexal regions are unremarkable with no mass. BONES AND SOFT TISSUES: No acute osseous abnormality. Persistent soft tissue densities within the anterior abdominal wall soft tissues are likely related to medication injection. IMPRESSION: 1. Minimally obstructive 9 mm calcified stone within the left renal pelvis with associated urothelial thickening concerning for superimposed infection - correlate with urinalysis.  2. Nonobstructive bilateral nephrolithiasis. 3. Cholecystectomy and sigmoid resection. 4. Other, non-acute and/or normal findings as above. Electronically signed by: Morgane Naveau MD 03/18/2024 01:41 PM EST RP Workstation: [id number]   DG C-Arm 1-60 Min-No Report Result Date: 03/14/2024 Fluoroscopy was utilized by the requesting physician.  No radiographic interpretation.   DG C-Arm 1-60 Min-No Report Result Date: 03/14/2024 Fluoroscopy was utilized by the requesting physician.  No radiographic interpretation.   CT Renal Stone Study Result Date: 02/21/2024 CLINICAL DATA:  Abdominal pain.  Left-sided flank pain. EXAM: CT ABDOMEN AND PELVIS WITHOUT CONTRAST TECHNIQUE: Multidetector CT imaging of the abdomen and pelvis was performed following the standard protocol without IV contrast. RADIATION DOSE REDUCTION: This exam was performed according to the departmental dose-optimization program which includes automated exposure control, adjustment of the mA and/or kV according to patient size and/or use of iterative reconstruction technique. COMPARISON:  11/21/2020. FINDINGS: Lower chest: No acute abnormality. Hepatobiliary: No suspicious focal hepatic lesion identified within the limits of an unenhanced exam. Status post cholecystectomy. No biliary dilatation. Pancreas: Unremarkable. No pancreatic ductal dilatation or surrounding inflammatory changes. Spleen: Normal in size without focal abnormality. Adrenals/Urinary Tract: There is a 9 x 8 mm calculus at the left ureteropelvic junction with mild dilation of the left renal pelvis. No frank hydronephrosis. Additional nonobstructive left renal calculi measuring up to 5 mm at the inferior pole. Nonobstructive right renal calculi with the largest measuring up to 7 x 5 mm at the interpolar right kidney. Bladder is unremarkable. Stomach/Bowel: Small hiatal hernia again noted. Small bowel is grossly unremarkable. No obstruction or inflammatory changes. Sigmoid colonic  anastomosis. Vascular/Lymphatic: Abdominal aorta is normal in caliber with scattered atherosclerotic calcification. No enlarged abdominal or pelvic lymph nodes. Reproductive: Uterus and bilateral adnexa are unremarkable. Other: No abdominopelvic ascites. No intraperitoneal free air. Redemonstrated focal nodularity within the subcutaneous fat of the ventral abdominal wall, likely reflecting injection sites. No loculated fluid collection. Musculoskeletal: No acute osseous abnormality. No suspicious osseous lesion. IMPRESSION: 1. 9 mm calculus at the left ureteropelvic junction with mild dilation of the left renal pelvis. No frank hydronephrosis. 2. Additional nonobstructive left renal calculi measuring up to 5 mm. 3. Nonobstructive right renal calculi measuring up to 7 mm. 4.  Aortic Atherosclerosis (ICD10-I70.0). Electronically Signed   By: Harrietta Sherry M.D.   On: 02/21/2024 13:10       The results of  significant diagnostics from this hospitalization (including imaging, microbiology, ancillary and laboratory) are listed below for reference.     Microbiology: Recent Results (from the past 240 hours)  Blood culture (routine x 2)     Status: None (Preliminary result)   Collection Time: 03/18/24 12:19 PM   Specimen: BLOOD  Result Value Ref Range Status   Specimen Description   Final    BLOOD BLOOD LEFT ARM Performed at [facility], [address], [place], [place] [address]    Special Requests   Final    BOTTLES DRAWN AEROBIC AND ANAEROBIC Blood Culture adequate volume Performed at [facility], [address] [place], [place] [address]    Culture   Final    NO GROWTH 2 DAYS Performed at [facility], [address], [place], [place] [address]    Report Status PENDING  Incomplete  Blood culture (routine x 2)     Status: None (Preliminary result)   Collection Time: 03/18/24  1:25 PM   Specimen: BLOOD  Result Value Ref Range Status   Specimen  Description   Final    BLOOD LEFT ANTECUBITAL Performed at [facility], [address] [place], [place] [address]    Special Requests   Final    BOTTLES DRAWN AEROBIC ONLY Blood Culture results may not be optimal due to an inadequate volume of blood received in culture bottles Performed at [facility], [address] [place], [place] [address]    Culture   Final    NO GROWTH 2 DAYS Performed at [facility], [address] [place], [place] [address]    Report Status PENDING  Incomplete  Urine Culture     Status: Abnormal   Collection Time: 03/18/24  3:18 PM   Specimen: Urine, Clean Catch  Result Value Ref Range Status   Specimen Description   Final    URINE, CLEAN CATCH Performed at [facility], [address], [place], [place] [address]    Special Requests   Final    NONE Performed at [facility], [address] [place], [place] [address]    Culture MULTIPLE SPECIES PRESENT, SUGGEST RECOLLECTION (A)  Final   Report Status 03/20/2024 FINAL  Final     Labs:  CBC: Recent Labs  Lab 03/18/24 1219 03/19/24 0458 03/20/24 0500  WBC 10.9* 8.8 4.8  NEUTROABS 7.7  --   --   HGB 14.5 11.5* 11.5*  HCT 42.4 34.5* 34.8*  MCV 90.6 92.2 92.6  PLT 348 269 237   BMP &GFR Recent Labs  Lab 03/18/24 1219 03/19/24 0458 03/20/24 0500  NA 137 136 138  K 2.9* 3.7 3.7  CL 100 102 103  CO2 24 27 27   GLUCOSE 243* 118* 144*  BUN 15 15 14   CREATININE 0.79 0.93 0.72  CALCIUM  9.5 8.9 9.0  MG  --   --  1.7   Estimated Creatinine Clearance: 71 mL/min (by C-G formula based on SCr of 0.72 mg/dL). Liver & Pancreas: Recent Labs  Lab 03/18/24 1219 03/19/24 0458 03/20/24 0500  AST 16 67* 25  ALT 14 42 28  ALKPHOS 74 75 70  BILITOT 0.4 0.4 0.2  PROT 7.4 5.9* 6.0*  ALBUMIN 4.1 3.4* 3.4*   No results for input(s): LIPASE, AMYLASE in the last 168 hours. No results for input(s): AMMONIA in  the last 168 hours. Diabetic: No results for input(s): HGBA1C in the last  72 hours. Recent Labs  Lab 03/18/24 2214 03/19/24 0802 03/19/24 1213 03/19/24 1736 03/19/24 2024  GLUCAP 143* 101* 116* 148* 116*   Cardiac Enzymes: Recent Labs  Lab 03/19/24 0458  CKTOTAL 23*   No results for input(s): PROBNP in the last 8760 hours. Coagulation Profile: Recent Labs  Lab 03/14/24 1000 03/18/24 2015 03/19/24 0458 03/20/24 0500  INR 1.0 1.8* 2.0* 1.8*   Thyroid  Function Tests: No results for input(s): TSH, T4TOTAL, FREET4, T3FREE, THYROIDAB in the last 72 hours. Lipid Profile: No results for input(s): CHOL, HDL, LDLCALC, TRIG, CHOLHDL, LDLDIRECT in the last 72 hours. Anemia Panel: No results for input(s): VITAMINB12, FOLATE, FERRITIN, TIBC, IRON , RETICCTPCT in the last 72 hours. Urine analysis:    Component Value Date/Time   COLORURINE YELLOW 03/18/2024 1518   APPEARANCEUR CLOUDY (A) 03/18/2024 1518   LABSPEC 1.025 03/18/2024 1518   PHURINE 5.0 03/18/2024 1518   GLUCOSEU 150 (A) 03/18/2024 1518   HGBUR LARGE (A) 03/18/2024 1518   BILIRUBINUR NEGATIVE 03/18/2024 1518   KETONESUR NEGATIVE 03/18/2024 1518   PROTEINUR 100 (A) 03/18/2024 1518   UROBILINOGEN 1.0 11/29/2014 1300   NITRITE NEGATIVE 03/18/2024 1518   LEUKOCYTESUR SMALL (A) 03/18/2024 1518   Sepsis Labs: Invalid input(s): PROCALCITONIN, LACTICIDVEN   SIGNED:  Sagan Maselli T Jamy Cleckler, MD  [facility] 03/20/2024, 3:06 PM   "

## 2024-03-20 NOTE — ED Notes (Signed)
 Pt reports that she will take her morning medications at home.

## 2024-03-21 ENCOUNTER — Other Ambulatory Visit (HOSPITAL_COMMUNITY): Payer: Self-pay

## 2024-03-22 ENCOUNTER — Ambulatory Visit: Admitting: Podiatry

## 2024-03-23 LAB — CULTURE, BLOOD (ROUTINE X 2)
Culture: NO GROWTH
Culture: NO GROWTH
Special Requests: ADEQUATE

## 2024-03-24 ENCOUNTER — Ambulatory Visit

## 2024-04-10 ENCOUNTER — Ambulatory Visit: Admitting: Pharmacist

## 2024-04-10 DIAGNOSIS — I4819 Other persistent atrial fibrillation: Secondary | ICD-10-CM | POA: Diagnosis not present

## 2024-04-10 DIAGNOSIS — Z86718 Personal history of other venous thrombosis and embolism: Secondary | ICD-10-CM | POA: Diagnosis not present

## 2024-04-10 DIAGNOSIS — D6869 Other thrombophilia: Secondary | ICD-10-CM | POA: Diagnosis not present

## 2024-04-10 DIAGNOSIS — I480 Paroxysmal atrial fibrillation: Secondary | ICD-10-CM | POA: Diagnosis not present

## 2024-04-10 LAB — POCT INR: INR: 3.4 — AB (ref 2.0–3.0)

## 2024-04-10 NOTE — Progress Notes (Signed)
 Description   INR-3.4; Hold dose today and then resume taking warfarin 2 tablets daily, except 3 tablets every Tuesday, Thursday and Saturday.  Recheck INR in 2 weeks Call Coumadin  Clinic [phone] Procedure Fax # [phone]

## 2024-04-10 NOTE — Patient Instructions (Signed)
 Description   INR-3.4; Hold dose today and then resume taking warfarin 2 tablets daily, except 3 tablets every Tuesday, Thursday and Saturday.  Recheck INR in 2 weeks Call Coumadin  Clinic [phone] Procedure Fax # [phone]

## 2024-04-19 ENCOUNTER — Ambulatory Visit (INDEPENDENT_AMBULATORY_CARE_PROVIDER_SITE_OTHER): Admitting: Podiatry

## 2024-04-19 ENCOUNTER — Encounter: Payer: Self-pay | Admitting: Podiatry

## 2024-04-19 DIAGNOSIS — M79674 Pain in right toe(s): Secondary | ICD-10-CM

## 2024-04-19 DIAGNOSIS — E1169 Type 2 diabetes mellitus with other specified complication: Secondary | ICD-10-CM

## 2024-04-19 DIAGNOSIS — B351 Tinea unguium: Secondary | ICD-10-CM

## 2024-04-19 DIAGNOSIS — M79675 Pain in left toe(s): Secondary | ICD-10-CM

## 2024-04-19 NOTE — Progress Notes (Signed)
This patient returns to my office for at risk foot care.  This patient requires this care by a professional since this patient will be at risk due to having coagulation defect.  Patient is taking coumadin.  This patient is unable to cut nails herself since the patient cannot reach her nails.These nails are painful walking and wearing shoes.  This patient presents for at risk foot care today.  General Appearance  Alert, conversant and in no acute stress.  Vascular  Dorsalis pedis and posterior tibial  pulses are palpable  bilaterally.  Capillary return is within normal limits  bilaterally. Temperature is within normal limits  bilaterally.  Neurologic  Senn-Weinstein monofilament wire test within normal limits  bilaterally. Muscle power within normal limits bilaterally.  Nails Thick disfigured discolored nails with subungual debris  Second left and first and third right foot.  No evidence of bacterial infection or drainage bilaterally.  Orthopedic  No limitations of motion  feet .  No crepitus or effusions noted.  No bony pathology or digital deformities noted.  Skin  normotropic skin with no porokeratosis noted bilaterally.  No signs of infections or ulcers noted.     Onychomycosis  Pain in right toes  Pain in left toes  Consent was obtained for treatment procedures.   Mechanical debridement of nails 1-5  bilaterally performed with a nail nipper.  Filed with dremel without incident.    Return office visit   9 weeks                  Told patient to return for periodic foot care and evaluation due to potential at risk complications.   Limmie Schoenberg DPM  

## 2024-04-21 ENCOUNTER — Other Ambulatory Visit: Payer: Self-pay | Admitting: Urology

## 2024-04-24 ENCOUNTER — Other Ambulatory Visit: Payer: Self-pay | Admitting: Urology

## 2024-04-24 ENCOUNTER — Ambulatory Visit

## 2024-05-02 ENCOUNTER — Ambulatory Visit

## 2024-05-02 ENCOUNTER — Encounter (HOSPITAL_COMMUNITY): Admission: RE | Admit: 2024-05-02

## 2024-05-09 ENCOUNTER — Ambulatory Visit (HOSPITAL_COMMUNITY): Admit: 2024-05-09 | Admitting: Urology

## 2024-05-25 ENCOUNTER — Ambulatory Visit: Admitting: Physician Assistant

## 2024-06-20 ENCOUNTER — Ambulatory Visit (HOSPITAL_BASED_OUTPATIENT_CLINIC_OR_DEPARTMENT_OTHER): Admitting: Cardiology

## 2024-06-21 ENCOUNTER — Ambulatory Visit: Admitting: Podiatry
